# Patient Record
Sex: Female | Born: 1940 | Race: White | Hispanic: No | Smoking: Never smoker
Health system: Southern US, Community
[De-identification: ages and names within clinical notes are randomized; demographics above are authoritative.]

## PROBLEM LIST (undated history)

## (undated) DIAGNOSIS — Z8701 Personal history of pneumonia (recurrent): Secondary | ICD-10-CM

## (undated) DIAGNOSIS — E785 Hyperlipidemia, unspecified: Secondary | ICD-10-CM

## (undated) DIAGNOSIS — I2510 Atherosclerotic heart disease of native coronary artery without angina pectoris: Secondary | ICD-10-CM

## (undated) DIAGNOSIS — J45909 Unspecified asthma, uncomplicated: Secondary | ICD-10-CM

## (undated) DIAGNOSIS — M797 Fibromyalgia: Secondary | ICD-10-CM

## (undated) DIAGNOSIS — N186 End stage renal disease: Secondary | ICD-10-CM

## (undated) DIAGNOSIS — N183 Chronic kidney disease, stage 3 unspecified: Secondary | ICD-10-CM

## (undated) DIAGNOSIS — E119 Type 2 diabetes mellitus without complications: Secondary | ICD-10-CM

## (undated) DIAGNOSIS — I10 Essential (primary) hypertension: Secondary | ICD-10-CM

## (undated) DIAGNOSIS — I4891 Unspecified atrial fibrillation: Secondary | ICD-10-CM

## (undated) DIAGNOSIS — G709 Myoneural disorder, unspecified: Secondary | ICD-10-CM

## (undated) DIAGNOSIS — Z8489 Family history of other specified conditions: Secondary | ICD-10-CM

## (undated) DIAGNOSIS — G8929 Other chronic pain: Secondary | ICD-10-CM

## (undated) DIAGNOSIS — J90 Pleural effusion, not elsewhere classified: Secondary | ICD-10-CM

## (undated) DIAGNOSIS — M1990 Unspecified osteoarthritis, unspecified site: Secondary | ICD-10-CM

## (undated) DIAGNOSIS — I219 Acute myocardial infarction, unspecified: Secondary | ICD-10-CM

## (undated) DIAGNOSIS — J449 Chronic obstructive pulmonary disease, unspecified: Secondary | ICD-10-CM

## (undated) DIAGNOSIS — D649 Anemia, unspecified: Secondary | ICD-10-CM

## (undated) DIAGNOSIS — M545 Low back pain, unspecified: Secondary | ICD-10-CM

## (undated) HISTORY — PX: BACK SURGERY: SHX140

## (undated) HISTORY — PX: CHOLECYSTECTOMY: SHX55

## (undated) HISTORY — DX: Essential (primary) hypertension: I10

## (undated) HISTORY — DX: Personal history of pneumonia (recurrent): Z87.01

## (undated) HISTORY — PX: LUMBAR DISC SURGERY: SHX700

## (undated) HISTORY — DX: Atherosclerotic heart disease of native coronary artery without angina pectoris: I25.10

## (undated) HISTORY — DX: Unspecified atrial fibrillation: I48.91

## (undated) HISTORY — DX: End stage renal disease: N18.6

## (undated) HISTORY — DX: Hyperlipidemia, unspecified: E78.5

## (undated) HISTORY — PX: TONSILLECTOMY: SUR1361

## (undated) HISTORY — DX: Acute myocardial infarction, unspecified: I21.9

---
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
## 1969-02-24 HISTORY — PX: APPENDECTOMY: SHX54

## 1979-02-25 HISTORY — PX: ABDOMINAL HYSTERECTOMY: SHX81

## 1999-02-25 HISTORY — PX: ANTERIOR CERVICAL DECOMP/DISCECTOMY FUSION: SHX1161

## 2010-07-20 ENCOUNTER — Inpatient Hospital Stay (HOSPITAL_COMMUNITY)
Admission: RE | Admit: 2010-07-20 | Discharge: 2010-07-21 | Payer: Self-pay | Source: Home / Self Care | Attending: Orthopedic Surgery | Admitting: Orthopedic Surgery

## 2010-07-20 LAB — URINE MICROSCOPIC-ADD ON

## 2010-07-20 LAB — GLUCOSE, CAPILLARY: Glucose-Capillary: 375 mg/dL — ABNORMAL HIGH (ref 70–99)

## 2010-07-20 LAB — COMPREHENSIVE METABOLIC PANEL
ALT: 20 U/L (ref 0–35)
Alkaline Phosphatase: 61 U/L (ref 39–117)
BUN: 21 mg/dL (ref 6–23)
Chloride: 98 mEq/L (ref 96–112)
Glucose, Bld: 352 mg/dL — ABNORMAL HIGH (ref 70–99)
Potassium: 4.1 mEq/L (ref 3.5–5.1)
Sodium: 134 mEq/L — ABNORMAL LOW (ref 135–145)
Total Bilirubin: 1.1 mg/dL (ref 0.3–1.2)
Total Protein: 6.3 g/dL (ref 6.0–8.3)

## 2010-07-20 LAB — URINALYSIS, ROUTINE W REFLEX MICROSCOPIC
Leukocytes, UA: NEGATIVE
Nitrite: NEGATIVE
Protein, ur: 100 mg/dL — AB
Specific Gravity, Urine: 1.027 (ref 1.005–1.030)
Urobilinogen, UA: 0.2 mg/dL (ref 0.0–1.0)

## 2010-07-20 LAB — SURGICAL PCR SCREEN
MRSA, PCR: NEGATIVE
Staphylococcus aureus: POSITIVE — AB

## 2010-07-20 LAB — DIFFERENTIAL
Basophils Absolute: 0 10*3/uL (ref 0.0–0.1)
Lymphocytes Relative: 38 % (ref 12–46)
Monocytes Absolute: 0.5 10*3/uL (ref 0.1–1.0)
Neutro Abs: 2.3 10*3/uL (ref 1.7–7.7)
Neutrophils Relative %: 51 % (ref 43–77)

## 2010-07-20 LAB — CBC
HCT: 35.6 % — ABNORMAL LOW (ref 36.0–46.0)
Hemoglobin: 11.7 g/dL — ABNORMAL LOW (ref 12.0–15.0)
MCHC: 32.9 g/dL (ref 30.0–36.0)
RBC: 4.11 MIL/uL (ref 3.87–5.11)
WBC: 4.4 10*3/uL (ref 4.0–10.5)

## 2010-07-20 LAB — TYPE AND SCREEN
ABO/RH(D): O POS
Antibody Screen: NEGATIVE

## 2010-07-20 LAB — APTT: aPTT: 27 seconds (ref 24–37)

## 2010-07-20 LAB — PROTIME-INR
INR: 1 (ref 0.00–1.49)
Prothrombin Time: 13.4 seconds (ref 11.6–15.2)

## 2010-07-21 LAB — GLUCOSE, CAPILLARY

## 2010-07-23 LAB — POCT I-STAT 4, (NA,K, GLUC, HGB,HCT): Sodium: 138 mEq/L (ref 135–145)

## 2010-07-23 NOTE — Op Note (Signed)
Joyce [name], Joyce [name]              ACCOUNT NO.:  [id number]  MEDICAL RECORD NO.:  [id number]          PATIENT TYPE:  INP  LOCATION:  2550                         FACILITY:  [facility]  PHYSICIAN:  Phylliss Bob, MD      DATE OF BIRTH:  1940-12-19  DATE OF PROCEDURE: DATE OF DISCHARGE:                              OPERATIVE REPORT   PREOPERATIVE DIAGNOSIS:  Myelopathy.  POSTOPERATIVE DIAGNOSIS:  Myelopathy.  PROCEDURE: 1. C3-4, C4-5 anterior cervical decompression and fusion. 2. Use of structural allograft x2. 3. Use of local autograft. 4. Placement of anterior instrumentation C3-C5. 5. Intraoperative use of fluoroscopy.  SURGEON:  Phylliss Bob, MD  ASSISTANT:  None.  ANESTHESIA:  General endotracheal anesthesia.  ESTIMATED BLOOD LOSS:  50 mL.  COMPLICATIONS:  None.  DISPOSITION:  Stable.  INDICATIONS FOR PROCEDURE:  Briefly, Ms. Hoxsie is a pleasant 70 year old female who presented to my office with signs and symptoms associated with myelopathy.  I did review an MRI, which was consistent with spinal cord compression at the C3-4 and C4-5 levels.  I, therefore, had a discussion with the patient regarding going forward with two-level anterior cervical decompression and fusion.  The patient did understand the risks and benefits of the procedure, and did agree to go forward with the procedure outlined above.  OPERATIVE DETAILS:  On July 20, 2010, the patient was brought to surgery and general endotracheal anesthesia was administered.  The patient was placed supine on the hospital bed.  The neck was placed in a gentle degree of extension.  The bed was placed in reverse Trendelenburg position.  The shoulders were taped to the inferior aspect of the bed. I did bring in lateral fluoroscopy and did obtain a lateral image to optimize the location of my left transverse incision.  The neck was prepped and draped in the usual sterile fashion and a time-out procedure was  performed.  I then made an incision from approximately the midline to the medial border of the sternocleidomastoid muscle.  I identified the plane between the sternocleidomastoid muscle laterally and strap muscles medially.  The plane was explored, and the anterior cervical spine was readily identified.  I did obtain a lateral intraoperative radiograph with a marker needle at the C3-4 interspace.  I then subperiosteally exposed the vertebral bodies of C3, C4, and C5.  I then turned my attention towards the C4-5 interspace.  I placed Caspar distractors across the C4-5 interspace.  I did go forward with a diskectomy in the usual manner using a series of Kerrisons, curettes, and pituitaries.  I did place a gentle degree of distraction across the C4-5 interspace using the Caspar pins.  Of note, self-retaining Shadow- Line retractor was placed.  I did readily identify the posterior longitudinal ligament.  The posterior longitudinal ligament was entered and I did use a #2 Kerrison to remove the posterior longitudinal ligament as well as osteophytes that were noted posteriorly.  The decompression was performed out to the uncovertebral joints of both the right and left sides.  I was easily able to pass the nerve hook out the right and left neural foramen and there  was clearly no compression of the spinal cord at the termination of the procedure.  I then went forward with a series of trials.  I first used a 6 and then a 7-mm rasp. I then placed a 7-mm trial.  I felt that a 7-mm intervertebral graft would be the ideal fit.  I then chose a 7-mm graft from the [employer].  The 7-mm cortical cancellous iliac crest allograft was tamped into position.  Of note, there were extensive osteophytes noted anteriorly and the autograft was collected upon removal of the osteophytes.  The autograft was placed above and below the intervertebral graft in order to help optimize fusion.  I  then turned my attention towards the C3-4 interspace.  I performed diskectomy in the manner described previously.  Again, there was severe compression identified and this was entirely alleviated by the termination of the procedure.  I again placed a 7-mm allograft as described previously and I did place an autograft that was obtained from the decortication aspect of the procedure above and below the graft.  I then chose an appropriately sized Synthes Vectra plate and this was secured across the C3-4 and C4-5 interspaces.  60 mm self-drilling, self-tapping screws were placed through the plate.  I did use AP and lateral fluoroscopic views to help optimize the location of the plate.  I was very happy with the final construct.  All bleeding was meticulously controlled.  Of note, neurologic monitoring was used throughout the procedure.  The monitoring technician did inform me that there were decreased signals in the left foot whereas the right lower extremity and the upper extremities were normal.  Upon obtaining this information, I did get a lateral intraoperative radiograph and they confirmed that the allografts were in their appropriate positions.  There was no undue bleeding and therefore I did feel that this was likely a technical difficulty.  I then proceeded with closure using 2-0 Vicryl and 3-0 Monocryl.  I did perform a thorough neurologic evaluation upon awakening.  The patient was noted to have full neurovascular function with no strength deficits identified.  The patient also was noted to have full sensation.  Of note, all instrument counts were correct at the termination of the procedure.     Phylliss Bob, MD     MD/MEDQ  D:  07/20/2010  T:  07/21/2010  Job:  [id number]  Electronically Signed by Phylliss Bob  on 07/23/2010 09:04:11 AM

## 2010-07-23 NOTE — Op Note (Signed)
  Joyce [name], Joyce [name]              ACCOUNT NO.:  [id number]  MEDICAL RECORD NO.:  [id number]          PATIENT TYPE:  INP  LOCATION:  2550                         FACILITY:  [facility]  PHYSICIAN:  Phylliss Bob, MD      DATE OF BIRTH:  15-Dec-1940  DATE OF PROCEDURE: DATE OF DISCHARGE:                              OPERATIVE REPORT   NO DICTATION FOR THIS JOB.     Phylliss Bob, MD     MD/MEDQ  D:  07/20/2010  T:  07/21/2010  Job:  [id number]  Electronically Signed by Phylliss Bob  on 07/23/2010 09:04:03 AM

## 2010-08-18 NOTE — Discharge Summary (Signed)
  Joyce [name], Joyce [name]              ACCOUNT NO.:  [id number]  MEDICAL RECORD NO.:  [id number]          PATIENT TYPE:  INP  LOCATION:  5023                         FACILITY:  [facility]  PHYSICIAN:  Phylliss Bob, MD      DATE OF BIRTH:  02-Aug-1940  DATE OF ADMISSION:  07/20/2010 DATE OF DISCHARGE:  07/21/2010                              DISCHARGE SUMMARY   ADMISSION DIAGNOSIS:  Myelopathy.  ADMISSION HISTORY:  Briefly, Ms. Shasteen is a pleasant 70 year old female who presented to my office with signs and symptoms associated with myelopathy.  I did review an MRI which was consistent with spinal cord compression at the C3-4 and C4-5 levels.  I therefore admitted the patient on July 20, 2010, for the procedure outlined above.  HOSPITAL COURSE:  On July 20, 2010, the patient was brought to Surgery and underwent a 2-level anterior cervical decompression and fusion as outlined above.  The patient tolerated the procedure well and was transferred to recovery in stable condition.  The patient was admitted to the regular floor uneventfully.  The patient had minimal pain postoperatively.  The patient was evaluated in the morning of postop day #1 and was noted to be neurovascular intact.  Her wound was clean, dry, and intact.  She was well maintained in her Aspen cervical collar.  The patient was discharged home uneventfully on postop day #1.  DISCHARGE INSTRUCTIONS:  The patient will take Percocet for pain and Valium for muscle spasms.  She will be maintained in her hard cervical collar at all times.  I will see the patient back in approximately 2 weeks for a postoperative evaluation.     Phylliss Bob, MD     MD/MEDQ  D:  08/05/2010  T:  08/05/2010  Job:  [id number]  Electronically Signed by Phylliss Bob  on 08/18/2010 02:34:37 PM

## 2014-06-10 ENCOUNTER — Inpatient Hospital Stay (HOSPITAL_COMMUNITY)
Admission: EM | Admit: 2014-06-10 | Discharge: 2014-06-22 | DRG: 234 | Disposition: A | Discharge status: Skilled Nursing Facility | Payer: Medicare Other | Source: Other Acute Inpatient Hospital | Attending: Cardiothoracic Surgery | Admitting: Cardiothoracic Surgery

## 2014-06-10 DIAGNOSIS — E669 Obesity, unspecified: Secondary | ICD-10-CM | POA: Diagnosis present

## 2014-06-10 DIAGNOSIS — M797 Fibromyalgia: Secondary | ICD-10-CM | POA: Diagnosis present

## 2014-06-10 DIAGNOSIS — I2510 Atherosclerotic heart disease of native coronary artery without angina pectoris: Secondary | ICD-10-CM

## 2014-06-10 DIAGNOSIS — I129 Hypertensive chronic kidney disease with stage 1 through stage 4 chronic kidney disease, or unspecified chronic kidney disease: Secondary | ICD-10-CM | POA: Diagnosis present

## 2014-06-10 DIAGNOSIS — G8929 Other chronic pain: Secondary | ICD-10-CM | POA: Diagnosis present

## 2014-06-10 DIAGNOSIS — E1169 Type 2 diabetes mellitus with other specified complication: Secondary | ICD-10-CM

## 2014-06-10 DIAGNOSIS — E8770 Fluid overload, unspecified: Secondary | ICD-10-CM | POA: Diagnosis not present

## 2014-06-10 DIAGNOSIS — I10 Essential (primary) hypertension: Secondary | ICD-10-CM | POA: Diagnosis present

## 2014-06-10 DIAGNOSIS — Z951 Presence of aortocoronary bypass graft: Secondary | ICD-10-CM

## 2014-06-10 DIAGNOSIS — G250 Essential tremor: Secondary | ICD-10-CM | POA: Diagnosis present

## 2014-06-10 DIAGNOSIS — D62 Acute posthemorrhagic anemia: Secondary | ICD-10-CM | POA: Diagnosis not present

## 2014-06-10 DIAGNOSIS — Z794 Long term (current) use of insulin: Secondary | ICD-10-CM | POA: Diagnosis not present

## 2014-06-10 DIAGNOSIS — N183 Chronic kidney disease, stage 3 (moderate): Secondary | ICD-10-CM | POA: Diagnosis present

## 2014-06-10 DIAGNOSIS — N185 Chronic kidney disease, stage 5: Secondary | ICD-10-CM | POA: Diagnosis present

## 2014-06-10 DIAGNOSIS — I214 Non-ST elevation (NSTEMI) myocardial infarction: Principal | ICD-10-CM | POA: Diagnosis present

## 2014-06-10 DIAGNOSIS — I25110 Atherosclerotic heart disease of native coronary artery with unstable angina pectoris: Secondary | ICD-10-CM | POA: Diagnosis present

## 2014-06-10 DIAGNOSIS — K219 Gastro-esophageal reflux disease without esophagitis: Secondary | ICD-10-CM | POA: Diagnosis present

## 2014-06-10 DIAGNOSIS — R079 Chest pain, unspecified: Secondary | ICD-10-CM | POA: Diagnosis present

## 2014-06-10 DIAGNOSIS — E1122 Type 2 diabetes mellitus with diabetic chronic kidney disease: Secondary | ICD-10-CM | POA: Diagnosis not present

## 2014-06-10 DIAGNOSIS — K5900 Constipation, unspecified: Secondary | ICD-10-CM | POA: Diagnosis not present

## 2014-06-10 DIAGNOSIS — N189 Chronic kidney disease, unspecified: Secondary | ICD-10-CM

## 2014-06-10 DIAGNOSIS — Z6832 Body mass index (BMI) 32.0-32.9, adult: Secondary | ICD-10-CM

## 2014-06-10 NOTE — Progress Notes (Signed)
ANTICOAGULATION CONSULT NOTE - Initial Consult  Pharmacy Consult for heparin  Indication: chest pain/ACS/nstemi  Allergies not on file  Patient Measurements: Height: 5\' 5"  (165.1 cm) Weight: 194 lb 14.2 oz (88.4 kg) IBW/kg (Calculated) : 57 Heparin Dosing Weight:   Vital Signs: Temp: 98.1 F (36.7 C) (12/16 2100) Temp Source: Oral (12/16 2100) BP: 175/75 mmHg (12/16 2315) Pulse Rate: 64 (12/16 2315)  Labs: No results for input(s): HGB, HCT, PLT, APTT, LABPROT, INR, HEPARINUNFRC, CREATININE, CKTOTAL, CKMB, TROPONINI in the last 72 hours.  Estimated Creatinine Clearance: 46.1 mL/min (by C-G formula based on Cr of 1.23).   Medical History: No past medical history on file.  Medications: no anticoags on home med list.   No prescriptions prior to admission    Assessment: NSTEMI transfer from [facility] worsening cp x3 days. Hx HTN, CKD III, DM II chronic pain HLD fatty liver depression and essential tremor. PLT H/H WNL. Heparin was started at [facility] and continued until arrival when it was turned off. Off >2 hours.  Goal of Therapy:  Heparin level 0.3-0.7 units/ml Monitor platelets by anticoagulation protocol: Yes   Plan:  Heparin 4000 unitsx1 then 1000 units/hr.  8 hour HL  Daily HL and cbc starting 12/18   Connye Burkitt 06/10/2014,11:58 PM

## 2014-06-10 NOTE — H&P (Signed)
History and Physical  Patient ID: ADRIANN MCBRYAR MRN: [id number], SOB: 1941/06/11 73 y.o. Date of Encounter: 06/10/2014, 11:17 PM  Primary Physician: Jenean Lindau at [facility] clinic Primary Cardiologist: unassigned  Chief Complaint: chest pain  HPI: 72 y.o. female w/ PMHx significant for HTN, DM2, CKD who presented as a transfer from [facility] on 06/10/2014 with complaints of chest pain this AM. Recent history includes a several month history of exertional chest discomfort. She reports a few weeks back, she underwent a chemical stress test which she reports was negative. However, the symptoms recurred and this AM, after discussing with a relative who is a resident at [facility], she sought urgent care at her PCP who was concerned about EKG changes and referred her to the ER. At the ER, she was found to have an elevated troponin prompting transfer to [facility].  She reports central chest discomfort that occurs with exertion such as housework. Radiates to L arm and jaw and is relieved with rest. Has had trouble with beta blockers before (shortness of breath/palpitations). Not on a statin.  At [facility], she was hypertensive to 170s but chest pain free. started on heparin gtt prior to transfer.  Outside CXR was without acute cardiopulmonary abnormalities.   EKG: sinus, slight dep in V6 but otherwise, rather normal.  Labs are significant for:  Glu 160 Bun 30 Cr 1.6 GFR 32 Na 137 K 4.5 Co 25 Mg 1.8 Trop 0.12  WBC 6.2 Hct 34 plt 177 inr 0.9   PMH: 1. HTN 2. CKD, baseline ~1.6-1.7 3. GERD 4. Essential tremor 5. DM2 6. H/o spinal surgery complicated by wound infection 7. Fibromylagia per pt  Surgical History: No past surgical history on file.   Home Meds: lantus 40 qhs trazadone 100 norco prn Tramadol 100 mg q8 prn Lispro 8 tidwm Primidone 100 mg qd Amlodipine 10 mg qd Metformin 1000 bid HCTZ qday Lisinopril 20 Hydralazine 25 tid ASA 81 mg  qday  Allergies:  Codeine Metoprolol- palpitations Carvedilol- SOB  History   Social History  . Marital Status: Married    Spouse Name: N/A    Number of Children: N/A  . Years of Education: N/A   Occupational History  . Not on file.   Social History Main Topics  . Smoking status: Not on file  . Smokeless tobacco: Not on file  . Alcohol Use: Not on file  . Drug Use: Not on file  . Sexual Activity: Not on file   Other Topics Concern  . Not on file   Social History Narrative  . No narrative on file    FH: Diabetes, HTN No CAD  Review of Systems General: negative for chills, fever, night sweats or weight changes.  Cardiovascular: see HPI Dermatological: negative for rash Respiratory: negative for cough or wheezing Urologic: negative for hematuria Abdominal: negative for nausea, vomiting, diarrhea, bright red blood per rectum, melena, or hematemesis Neurologic: negative for visual changes, syncope, or dizziness All other systems reviewed and are otherwise negative except as noted above.  Labs:  see HPI Radiology/Studies:  See HPI  EKG: sinus, slight ST depression laterally  Physical Exam: Blood pressure 180/64, pulse 70, temperature 98.1 F (36.7 C), temperature source Oral, resp. rate 18, height 5\' 5"  (1.651 m), weight 88.4 kg (194 lb 14.2 oz), SpO2 99 %. General: Well developed, well nourished, in no acute distress. Head: Normocephalic, atraumatic, sclera non-icteric, nares are without discharge Neck: Supple. Negative for carotid bruits. JVD not elevated. Lungs: Clear  bilaterally to auscultation without wheezes, rales, or rhonchi. Breathing is unlabored. Heart: RRR with S1 S2. No murmurs, rubs, or gallops appreciated. Abdomen: Soft, non-tender, non-distended with normoactive bowel sounds. No rebound/guarding. No obvious abdominal masses. Msk:  Strength and tone appear normal for age. Extremities: No edema. No clubbing or cyanosis. Distal pedal pulses are 2+  and equal bilaterally. Neuro: Alert and oriented X 3. Moves all extremities spontaneously. Psych:  Responds to questions appropriately with a normal affect.   Problem List 1. Chest pain, elevated troponin  --> NSTEMI 2. HTN, poorly controlled currently 3. DM2, on insulin, orals 4. CKD, baseline ~1.6-1.7 5. Chronic pain/fibromyalgia  ASSESSMENT AND PLAN:  73 y.o. female w/ PMHx significant for HTN, DM2, CKD who presented as a transfer from [facility] on 06/10/2014 with complaints of chest pain this AM. Recent history includes a several month history of exertional chest discomfort, now with worsening symptoms --> NSTEMI.  Her history is rather classic for progressive angina though her negative stress test might have led everyone astray (or maybe small vessel dz, 3 vessel dz, false negative). WIll try alternative beta blocker with bisoprolol due to issues in the past. Continue aspirin, heparin gtt. Start statin. For BP control, will continue home amlodipine and hydralazine. Due to borderline renal function and anticipation of catheterization, hold home ACEI and HCTZ but otherwise, ACEI should be restarted prior to discharge. NPO for catheterization. Gentle hydration prior to cath.  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 As above, at risk with LHC for nephropathy. Renally dose medications. Gentle pre-cath hydration.  Continue chronic pain regimen.  Half dose lantus due to NPO status. Sliding scale to cover. Metformin held and is contraindicated in pt with renal dysfunction.  Full code. NPO Heparin gtt.  Signed, Elias Else, Cullen Lahaie C. MD 06/10/2014, 11:17 PM

## 2014-06-11 ENCOUNTER — Encounter (HOSPITAL_COMMUNITY)
Admission: EM | Disposition: A | Discharge status: Skilled Nursing Facility | Payer: Self-pay | Source: Other Acute Inpatient Hospital | Attending: Cardiothoracic Surgery

## 2014-06-11 ENCOUNTER — Encounter (HOSPITAL_COMMUNITY): Payer: Self-pay

## 2014-06-11 DIAGNOSIS — I10 Essential (primary) hypertension: Secondary | ICD-10-CM

## 2014-06-11 DIAGNOSIS — N185 Chronic kidney disease, stage 5: Secondary | ICD-10-CM | POA: Diagnosis present

## 2014-06-11 DIAGNOSIS — I214 Non-ST elevation (NSTEMI) myocardial infarction: Secondary | ICD-10-CM | POA: Diagnosis present

## 2014-06-11 DIAGNOSIS — N183 Chronic kidney disease, stage 3 (moderate): Secondary | ICD-10-CM

## 2014-06-11 DIAGNOSIS — E669 Obesity, unspecified: Secondary | ICD-10-CM

## 2014-06-11 DIAGNOSIS — E119 Type 2 diabetes mellitus without complications: Secondary | ICD-10-CM

## 2014-06-11 DIAGNOSIS — I25110 Atherosclerotic heart disease of native coronary artery with unstable angina pectoris: Secondary | ICD-10-CM

## 2014-06-11 DIAGNOSIS — E1169 Type 2 diabetes mellitus with other specified complication: Secondary | ICD-10-CM

## 2014-06-11 HISTORY — PX: LEFT HEART CATHETERIZATION WITH CORONARY ANGIOGRAM: SHX5451

## 2014-06-11 LAB — LIPID PANEL
Cholesterol: 177 mg/dL (ref 0–200)
HDL: 48 mg/dL (ref >39)
LDL CALC: 98 mg/dL (ref 0–99)
Total CHOL/HDL Ratio: 3.7 RATIO
Triglycerides: 157 mg/dL — ABNORMAL HIGH (ref <150)
VLDL: 31 mg/dL (ref 0–40)

## 2014-06-11 LAB — BASIC METABOLIC PANEL
ANION GAP: 12 (ref 5–15)
BUN: 25 mg/dL — AB (ref 6–23)
CHLORIDE: 105 meq/L (ref 96–112)
CO2: 23 mEq/L (ref 19–32)
CREATININE: 1.33 mg/dL — AB (ref 0.50–1.10)
Calcium: 9.3 mg/dL (ref 8.4–10.5)
GFR, EST AFRICAN AMERICAN: 45 mL/min — AB (ref >90)
GFR, EST NON AFRICAN AMERICAN: 39 mL/min — AB (ref >90)
Glucose, Bld: 113 mg/dL — ABNORMAL HIGH (ref 70–99)
Potassium: 4.3 mEq/L (ref 3.7–5.3)
Sodium: 140 mEq/L (ref 137–147)

## 2014-06-11 LAB — HEPARIN LEVEL (UNFRACTIONATED): HEPARIN UNFRACTIONATED: 0.31 [IU]/mL (ref 0.30–0.70)

## 2014-06-11 LAB — CBC
HEMATOCRIT: 29.4 % — AB (ref 36.0–46.0)
HEMOGLOBIN: 9.9 g/dL — AB (ref 12.0–15.0)
MCH: 28.4 pg (ref 26.0–34.0)
MCHC: 33.7 g/dL (ref 30.0–36.0)
MCV: 84.5 fL (ref 78.0–100.0)
Platelets: 135 10*3/uL — ABNORMAL LOW (ref 150–400)
RBC: 3.48 MIL/uL — AB (ref 3.87–5.11)
RDW: 13.3 % (ref 11.5–15.5)
WBC: 3.7 10*3/uL — AB (ref 4.0–10.5)

## 2014-06-11 LAB — TROPONIN I
Troponin I: <0.3 ng/mL (ref <0.30)
Troponin I: <0.3 ng/mL (ref <0.30)

## 2014-06-11 LAB — GLUCOSE, CAPILLARY
GLUCOSE-CAPILLARY: 128 mg/dL — AB (ref 70–99)
GLUCOSE-CAPILLARY: 126 mg/dL — AB (ref 70–99)
Glucose-Capillary: 136 mg/dL — ABNORMAL HIGH (ref 70–99)
Glucose-Capillary: 141 mg/dL — ABNORMAL HIGH (ref 70–99)
Glucose-Capillary: 73 mg/dL (ref 70–99)

## 2014-06-11 LAB — HEMOGLOBIN A1C
HEMOGLOBIN A1C: 5.9 % — AB (ref <5.7)
MEAN PLASMA GLUCOSE: 123 mg/dL — AB (ref <117)

## 2014-06-11 LAB — MRSA PCR SCREENING: MRSA BY PCR: NEGATIVE

## 2014-06-11 SURGERY — LEFT HEART CATHETERIZATION WITH CORONARY ANGIOGRAM
Anesthesia: LOCAL

## 2014-06-11 MED ORDER — ASPIRIN 81 MG PO CHEW: 81.0000 mg | CHEWABLE_TABLET | ORAL | Status: DC

## 2014-06-11 MED ORDER — INSULIN ASPART 100 UNIT/ML ~~LOC~~ SOLN
4.0000 [IU] | Freq: Three times a day (TID) | SUBCUTANEOUS | Status: DC
  Administered 2014-06-11 – 2014-06-14 (×10): 4 [IU] via SUBCUTANEOUS

## 2014-06-11 MED ORDER — HEPARIN (PORCINE) IN NACL 2-0.9 UNIT/ML-% IJ SOLN
INTRAMUSCULAR | Status: AC
  Filled 2014-06-11: qty 500

## 2014-06-11 MED ORDER — HEPARIN (PORCINE) IN NACL 100-0.45 UNIT/ML-% IJ SOLN
1400.0000 [IU]/h | INTRAMUSCULAR | Status: DC
  Administered 2014-06-12: 1000 [IU]/h via INTRAVENOUS
  Administered 2014-06-13 – 2014-06-15 (×3): 1400 [IU]/h via INTRAVENOUS
  Filled 2014-06-11 (×6): qty 250

## 2014-06-11 MED ORDER — HEPARIN (PORCINE) IN NACL 100-0.45 UNIT/ML-% IJ SOLN
1000.0000 [IU]/h | INTRAMUSCULAR | Status: DC
  Administered 2014-06-11: 1000 [IU]/h via INTRAVENOUS
  Filled 2014-06-11: qty 250

## 2014-06-11 MED ORDER — INSULIN ASPART 100 UNIT/ML ~~LOC~~ SOLN: 0.0000 [IU] | Freq: Every day | SUBCUTANEOUS | Status: DC

## 2014-06-11 MED ORDER — TRAZODONE HCL 100 MG PO TABS
100.0000 mg | ORAL_TABLET | Freq: Every evening | ORAL | Status: DC | PRN
  Administered 2014-06-11: 100 mg via ORAL
  Filled 2014-06-11: qty 1

## 2014-06-11 MED ORDER — SODIUM CHLORIDE 0.9 % IV SOLN
INTRAVENOUS | Status: DC
  Administered 2014-06-11: 01:00:00 via INTRAVENOUS

## 2014-06-11 MED ORDER — BISOPROLOL FUMARATE 5 MG PO TABS
5.0000 mg | ORAL_TABLET | Freq: Every day | ORAL | Status: DC
  Administered 2014-06-11 – 2014-06-14 (×4): 5 mg via ORAL
  Filled 2014-06-11 (×4): qty 1

## 2014-06-11 MED ORDER — NITROGLYCERIN 0.4 MG SL SUBL: 0.4000 mg | SUBLINGUAL_TABLET | SUBLINGUAL | Status: DC | PRN

## 2014-06-11 MED ORDER — FENTANYL CITRATE 0.05 MG/ML IJ SOLN
INTRAMUSCULAR | Status: AC
  Filled 2014-06-11: qty 2

## 2014-06-11 MED ORDER — SODIUM CHLORIDE 0.9 % IV SOLN: 1.0000 mL/kg/h | INTRAVENOUS | Status: AC

## 2014-06-11 MED ORDER — SODIUM CHLORIDE 0.9 % IV SOLN
250.0000 mL | INTRAVENOUS | Status: DC | PRN
Start: 2014-06-11 — End: 2014-06-11

## 2014-06-11 MED ORDER — SODIUM CHLORIDE 0.9 % IJ SOLN
3.0000 mL | Freq: Two times a day (BID) | INTRAMUSCULAR | Status: DC
  Administered 2014-06-12 – 2014-06-14 (×4): 3 mL via INTRAVENOUS

## 2014-06-11 MED ORDER — NITROGLYCERIN 1 MG/10 ML FOR IR/CATH LAB
INTRA_ARTERIAL | Status: AC
  Filled 2014-06-11: qty 10

## 2014-06-11 MED ORDER — VERAPAMIL HCL 2.5 MG/ML IV SOLN
INTRAVENOUS | Status: AC
  Filled 2014-06-11: qty 2

## 2014-06-11 MED ORDER — INSULIN ASPART 100 UNIT/ML ~~LOC~~ SOLN
0.0000 [IU] | Freq: Three times a day (TID) | SUBCUTANEOUS | Status: DC
  Administered 2014-06-11 – 2014-06-12 (×4): 2 [IU] via SUBCUTANEOUS
  Administered 2014-06-13: 3 [IU] via SUBCUTANEOUS
  Administered 2014-06-14: 2 [IU] via SUBCUTANEOUS
  Administered 2014-06-14: 5 [IU] via SUBCUTANEOUS

## 2014-06-11 MED ORDER — HEPARIN BOLUS VIA INFUSION
4000.0000 [IU] | Freq: Once | INTRAVENOUS | Status: AC
  Administered 2014-06-11: 4000 [IU] via INTRAVENOUS
  Filled 2014-06-11: qty 4000

## 2014-06-11 MED ORDER — ASPIRIN EC 81 MG PO TBEC
81.0000 mg | DELAYED_RELEASE_TABLET | Freq: Every day | ORAL | Status: DC
  Administered 2014-06-11 – 2014-06-14 (×4): 81 mg via ORAL
  Filled 2014-06-11 (×4): qty 1

## 2014-06-11 MED ORDER — SODIUM CHLORIDE 0.9 % IJ SOLN: 3.0000 mL | INTRAMUSCULAR | Status: DC | PRN

## 2014-06-11 MED ORDER — MIDAZOLAM HCL 2 MG/2ML IJ SOLN
INTRAMUSCULAR | Status: AC
  Filled 2014-06-11: qty 2

## 2014-06-11 MED ORDER — AMLODIPINE BESYLATE 10 MG PO TABS
10.0000 mg | ORAL_TABLET | Freq: Every day | ORAL | Status: DC
Start: 2014-06-11 — End: 2014-06-12
  Administered 2014-06-11 – 2014-06-12 (×2): 10 mg via ORAL
  Filled 2014-06-11 (×2): qty 1

## 2014-06-11 MED ORDER — ATORVASTATIN CALCIUM 40 MG PO TABS
40.0000 mg | ORAL_TABLET | Freq: Every day | ORAL | Status: DC
  Administered 2014-06-11 – 2014-06-14 (×4): 40 mg via ORAL
  Filled 2014-06-11 (×6): qty 1

## 2014-06-11 MED ORDER — HEPARIN (PORCINE) IN NACL 100-0.45 UNIT/ML-% IJ SOLN
1100.0000 [IU]/h | INTRAMUSCULAR | Status: DC
  Administered 2014-06-11: 1100 [IU]/h via INTRAVENOUS
  Filled 2014-06-11: qty 250

## 2014-06-11 MED ORDER — LIDOCAINE HCL (PF) 1 % IJ SOLN
INTRAMUSCULAR | Status: AC
  Filled 2014-06-11: qty 30

## 2014-06-11 MED ORDER — HYDRALAZINE HCL 25 MG PO TABS
25.0000 mg | ORAL_TABLET | Freq: Three times a day (TID) | ORAL | Status: DC
  Administered 2014-06-11 (×2): 25 mg via ORAL
  Filled 2014-06-11 (×5): qty 1

## 2014-06-11 MED ORDER — HYDRALAZINE HCL 50 MG PO TABS
50.0000 mg | ORAL_TABLET | Freq: Three times a day (TID) | ORAL | Status: DC
  Administered 2014-06-11 – 2014-06-12 (×3): 50 mg via ORAL
  Filled 2014-06-11 (×4): qty 1
  Filled 2014-06-11: qty 2

## 2014-06-11 MED ORDER — SODIUM CHLORIDE 0.9 % IV SOLN: 250.0000 mL | INTRAVENOUS | Status: DC | PRN

## 2014-06-11 MED ORDER — INSULIN GLARGINE 100 UNIT/ML ~~LOC~~ SOLN
20.0000 [IU] | Freq: Every day | SUBCUTANEOUS | Status: DC
  Administered 2014-06-11 – 2014-06-14 (×5): 20 [IU] via SUBCUTANEOUS
  Filled 2014-06-11 (×6): qty 0.2

## 2014-06-11 MED ORDER — HYDROCODONE-ACETAMINOPHEN 5-325 MG PO TABS
1.0000 | ORAL_TABLET | Freq: Four times a day (QID) | ORAL | Status: DC | PRN
  Administered 2014-06-11 – 2014-06-12 (×2): 1 via ORAL
  Filled 2014-06-11 (×2): qty 1

## 2014-06-11 MED ORDER — NITROGLYCERIN IN D5W 200-5 MCG/ML-% IV SOLN
2.0000 ug/min | INTRAVENOUS | Status: DC
  Administered 2014-06-11 – 2014-06-14 (×2): 10 ug/min via INTRAVENOUS
  Filled 2014-06-11 (×2): qty 250

## 2014-06-11 MED ORDER — PRIMIDONE 50 MG PO TABS
100.0000 mg | ORAL_TABLET | Freq: Every day | ORAL | Status: DC
  Administered 2014-06-11: 100 mg via ORAL
  Filled 2014-06-11 (×3): qty 2

## 2014-06-11 MED ORDER — ONDANSETRON HCL 4 MG/2ML IJ SOLN
4.0000 mg | Freq: Four times a day (QID) | INTRAMUSCULAR | Status: DC | PRN
  Administered 2014-06-11: 4 mg via INTRAVENOUS

## 2014-06-11 MED ORDER — ACETAMINOPHEN 325 MG PO TABS: 650.0000 mg | ORAL_TABLET | ORAL | Status: DC | PRN

## 2014-06-11 MED ORDER — HEPARIN (PORCINE) IN NACL 2-0.9 UNIT/ML-% IJ SOLN
INTRAMUSCULAR | Status: AC
  Filled 2014-06-11: qty 1500

## 2014-06-11 MED ORDER — ONDANSETRON HCL 4 MG/2ML IJ SOLN
INTRAMUSCULAR | Status: AC
  Filled 2014-06-11: qty 2

## 2014-06-11 MED ORDER — SODIUM CHLORIDE 0.9 % IJ SOLN: 3.0000 mL | Freq: Two times a day (BID) | INTRAMUSCULAR | Status: DC

## 2014-06-11 NOTE — Progress Notes (Signed)
ANTICOAGULATION CONSULT NOTE - Follow Up Consult  Pharmacy Consult for heparin Indication: chest pain/ACS  Allergies  Allergen Reactions  . Carvedilol Other (See Comments)    confusion  . Codeine Other (See Comments)    confusion  . Metoprolol Palpitations    Patient Measurements: Height: 5\' 5"  (165.1 cm) Weight: 193 lb 2 oz (87.6 kg) IBW/kg (Calculated) : 57 Heparin Dosing Weight: 76kg  Vital Signs: Temp: 97.9 F (36.6 C) (12/17 1305) Temp Source: Oral (12/17 1305) BP: 178/68 mmHg (12/17 1305) Pulse Rate: 65 (12/17 1558)  Labs:  Recent Labs  06/11/14 0025 06/11/14 0705 06/11/14 0956 06/11/14 1235  HGB  --  9.9*  --   --   HCT  --  29.4*  --   --   PLT  --  135*  --   --   HEPARINUNFRC  --  >2.20* 0.31  --   CREATININE  --  1.33*  --   --   TROPONINI <0.30 <0.30  --  <0.30    Estimated Creatinine Clearance: 42.4 mL/min (by C-G formula based on Cr of 1.33).   Medications:  Scheduled:  . amLODipine  10 mg Oral Daily  . aspirin EC  81 mg Oral Daily  . atorvastatin  40 mg Oral q1800  . bisoprolol  5 mg Oral Daily  . hydrALAZINE  50 mg Oral 3 times per day  . insulin aspart  0-15 Units Subcutaneous TID WC  . insulin aspart  0-5 Units Subcutaneous QHS  . insulin aspart  4 Units Subcutaneous TID WC  . insulin glargine  20 Units Subcutaneous QHS  . primidone  100 mg Oral QHS   Infusions:  . sodium chloride 50 mL/hr at 06/11/14 0059  . heparin Stopped (06/11/14 1525)  . nitroGLYCERIN 10 mcg/min (06/11/14 1146)    Assessment: 73 yo female from [place] on heparin for r/o ACS (troponin 0.12 at [facility]). The initial heparin level is > 2.2 and was possible lab error. Heparin level repeat was 0.31 and on low end of goal on 1000 units/hr.  She is now s/p cardiac cath with findings of severe 3VCAD and nl EF.  TCTS to be consulted for possible CABG.  Pharmacy consulted to dose heparin to start 4 hours after TR band off.  RN to start deflating band at 1820.   Goal  of Therapy:  Heparin level 0.3-0.7 units/ml Monitor platelets by anticoagulation protocol: Yes   Plan - resume heparin at 1100 units/hr 4 hrs after TR band removed -HL 8 hrs later -daily HL and CBC -RN to call pharmacy when TR band off so we can enter heparin orders  Eudelia Bunch, Pharm.D. [id number] 06/11/2014 5:49 PM

## 2014-06-11 NOTE — Interval H&P Note (Signed)
History and Physical Interval Note:  06/11/2014 4:24 PM  Joyce [name]  has presented today for surgery, with the diagnosis of cp  The various methods of treatment have been discussed with the patient and family. After consideration of risks, benefits and other options for treatment, the patient has consented to  Procedure(s): LEFT HEART CATHETERIZATION WITH CORONARY ANGIOGRAM (N/A) as a surgical intervention .  The patient's history has been reviewed, patient examined, no change in status, stable for surgery.  I have reviewed the patient's chart and labs.  Questions were answered to the patient's satisfaction.    Cath Lab Visit (complete for each Cath Lab visit)  Clinical Evaluation Leading to the Procedure:   ACS: Yes.    Non-ACS:    Anginal Classification: CCS IV  Anti-ischemic medical therapy: Maximal Therapy (2 or more classes of medications)  Non-Invasive Test Results: No non-invasive testing performed  Prior CABG: No previous CABG       Joyce [name]

## 2014-06-11 NOTE — CV Procedure (Signed)
    Cardiac Catheterization Procedure Note  Name: Joyce [name] MRN: [id number] DOB: 1941-04-06  Procedure: Left Heart Cath, Selective Coronary Angiography, LV angiography  Indication: Unstable angina. Pt with diabetes and progressive anginal symptoms.   Procedural Details: The right wrist was prepped, draped, and anesthetized with 1% lidocaine. The radial was weak. I used ultrasound but could not access the right radial artery. There was no flow demonstrated above the wrist. The left radial pulse was good. Using the modified Seldinger technique, a 5/6 French Slender sheath was introduced into the left radial artery without difficulty. 3 mg of verapamil was administered through the sheath, weight-based unfractionated heparin was administered intravenously. Standard Judkins catheters were used for selective coronary angiography and left ventriculography. Catheter exchanges were performed over an exchange length guidewire. There were no immediate procedural complications. A TR band was used for radial hemostasis at the completion of the procedure.  The patient was transferred to the post catheterization recovery area for further monitoring.  Procedural Findings: Hemodynamics: AO  180/71 LV 180/19  Coronary angiography: Coronary dominance: right  Left mainstem:  The left main is severely calcified. The vessel is widely patent with no obstruction. There are minor irregularities noted.  Left anterior descending (LAD):  The LAD courses to the LV apex. The vessel is severely calcified. The first diagonal covers a large distribution and has 2 major subbranches. Each subbranch of the diagonal has significant stenosis. The proximal subbranch has 50-60% stenosis. The more distal subbranch has diffuse 80-90% stenosis. The LAD proper has severe calcific disease from the diagonal origin extending into the mid vessel with 80-90% stenosis present. Further down in the mid LAD there is another 80% stenosis  present.  Left circumflex (LCx):  The left circumflex is patent. There is an intermediate branch with bifurcational disease where it divides into twin vessels. The superior branch has 90% ostial stenosis. The inferior branch is patent with mild nonobstructive disease. The AV circumflex has 95-99% stenosis but it only supplies a very small posterolateral branch.  Right coronary artery (RCA):  The RCA is severely calcified throughout the proximal, mid, and distal vessel. There were 2 tight focal lesions, one in the proximal RCA that is at least 80% stenotic, and one in the distal RCA it is also 75-80% stenotic. The PDA and PLA branches are large, especially the PDA.  Left ventriculography: Left ventricular systolic function is normal, LVEF is estimated at 55-65%, there is no significant mitral regurgitation   Estimated Blood Loss:  minimal  Final Conclusions:    1. Severe three-vessel coronary artery disease as outlined above with heavily calcified and diffusely diseased coronary vessels. 2. Normal LV systolic function.  Recommendations:  In this patient with diabetes and unstable angina, will consult TCTS for consideration of CABG. If the patient there is found to be a poor candidate for CABG, she could be treated percutaneously with stenting of the right coronary artery and rotational atherectomy of the LAD. I think she would have more complete revascularization with CABG if a reasonable candidate.  Sherren Mocha MD, [facility] 06/11/2014, 5:30 PM

## 2014-06-11 NOTE — Progress Notes (Addendum)
Pt tolerated removal of TR band well. Pt educated & informed about post band care. VS stable & charted. The site is a level 0. A 2 x 2 along with a tegaderm was applied to the site.  Will continue to monitor the pt. Hoover Brunette, RN

## 2014-06-11 NOTE — H&P (View-Only) (Signed)
Subjective: 2/10 CP & nausea  Objective: Vital signs in last 24 hours: Temp:  [98 F (36.7 C)-98.4 F (36.9 C)] 98.4 F (36.9 C) (12/17 0814) Pulse Rate:  [57-75] 75 (12/17 0814) Resp:  [13-20] 20 (12/17 0814) BP: (126-196)/(45-77) 196/74 mmHg (12/17 0814) SpO2:  [95 %-100 %] 99 % (12/17 0814) FiO2 (%):  [28 %] 28 % (12/17 0025) Weight:  [194 lb 14.2 oz (88.4 kg)] 194 lb 14.2 oz (88.4 kg) (12/16 2100)    Intake/Output from previous day: 12/16 0701 - 12/17 0700 In: 300 [I.V.:300] Out: 650 [Urine:650] Intake/Output this shift: Total I/O In: -  Out: 300 [Urine:300]  Medications Current Facility-Administered Medications  Medication Dose Route Frequency Provider Last Rate Last Dose  . 0.9 %  sodium chloride infusion   Intravenous Continuous Cletus Gash, MD 50 mL/hr at 06/11/14 0059    . amLODipine (NORVASC) tablet 10 mg  10 mg Oral Daily Cletus Gash, MD   10 mg at 06/11/14 1015  . aspirin EC tablet 81 mg  81 mg Oral Daily Cletus Gash, MD   81 mg at 06/11/14 1015  . atorvastatin (LIPITOR) tablet 40 mg  40 mg Oral q1800 Cletus Gash, MD      . bisoprolol (ZEBETA) tablet 5 mg  5 mg Oral Daily Cletus Gash, MD   5 mg at 06/11/14 1015  . heparin ADULT infusion 100 units/mL (25000 units/250 mL)  1,100 Units/hr Intravenous Continuous Dareen Piano, RPH 11 mL/hr at 06/11/14 1117 1,100 Units/hr at 06/11/14 1117  . hydrALAZINE (APRESOLINE) tablet 25 mg  25 mg Oral 3 times per day Cletus Gash, MD   25 mg at 06/11/14 [phone]  . HYDROcodone-acetaminophen (NORCO/VICODIN) 5-325 MG per tablet 1 tablet  1 tablet Oral Q6H PRN Cletus Gash, MD      . insulin aspart (novoLOG) injection 0-15 Units  0-15 Units Subcutaneous TID WC Cletus Gash, MD   2 Units at 06/11/14 [phone]  . insulin aspart (novoLOG) injection 0-5 Units  0-5 Units Subcutaneous QHS Cletus Gash, MD   0 Units at 06/11/14 [phone]  . insulin aspart (novoLOG) injection 4 Units  4 Units Subcutaneous  TID WC Cletus Gash, MD   4 Units at 06/11/14 0813  . insulin glargine (LANTUS) injection 20 Units  20 Units Subcutaneous QHS Cletus Gash, MD   20 Units at 06/11/14 0057  . nitroGLYCERIN (NITROSTAT) SL tablet 0.4 mg  0.4 mg Sublingual Q5 Min x 3 PRN Cletus Gash, MD      . primidone (MYSOLINE) tablet 100 mg  100 mg Oral QHS Cletus Gash, MD   100 mg at 06/11/14 0051    PE: General appearance: alert, cooperative and no distress Lungs: clear to auscultation bilaterally Heart: regular rate and rhythm and 1/6 sys MM Abdomen: +BS nontender Extremities: No LEE Pulses: 2+ and symmetric Skin: Warm and ddry Neurologic: Grossly normal  Lab Results:   Recent Labs  06/11/14 0705  WBC 3.7*  HGB 9.9*  HCT 29.4*  PLT 135*   BMET  Recent Labs  06/11/14 0705  NA 140  K 4.3  CL 105  CO2 23  GLUCOSE 113*  BUN 25*  CREATININE 1.33*  CALCIUM 9.3   PT/INR No results for input(s): LABPROT, INR in the last 72 hours. Cholesterol  Recent Labs  06/11/14 0705  CHOL 177   Lipid Panel     Component Value Date/Time   CHOL 177 06/11/2014 0705   TRIG 157* 06/11/2014 0705   HDL 48  06/11/2014 0705   CHOLHDL 3.7 06/11/2014 0705   VLDL 31 06/11/2014 0705   LDLCALC 98 06/11/2014 0705    Cardiac Panel (last 3 results)  Recent Labs  06/11/14 0025 06/11/14 0705  TROPONINI <0.30 <0.30    Assessment/Plan    NSTEMI (non-ST elevated myocardial infarction) Troponin elevated at [facility].  Starting IV nitro as she is having active CP and is hypertensive.  On IV heparin.  She is on bisoprolol and not tolerant to coreg or metoprolol.  Statin.  Left heart cath around 1400hrs. Zofran for nausea.   Being hydrated due to CKD.  SCr ok.  Check echo.     Chronic kidney disease (CKD), stage III (moderate)  SCr 1.33.  Continue to monitor.        HTN (hypertension)  Amlodipine 10, hydralazine 25-changing to home dose of 50 TID, Holding ACE for cath.  Titrate nitro.    Diabetes  mellitus type 2 in obese  CBG controlled and stable. SS and lantus.    LOS: 1 day    HAGER, BRYAN PA-C 06/11/2014 11:25 AM  Patient seen and examined and history reviewed. Agree with above findings and plan. Patient with some recurrent chest pain this am. Not as severe as before. BP elevated. She reports that it has been poorly controlled since 2000. Intolerant of beta blockers (metoprolol and carvedilol). Plan cardiac cath today. Being hydrated for CKD. Ecg here normal. Troponin at [facility] mildly elevated but normal here.   Laray Corbit [place], [place] 06/11/2014 12:00 PM

## 2014-06-11 NOTE — Progress Notes (Signed)
Subjective: 2/10 CP & nausea  Objective: Vital signs in last 24 hours: Temp:  [98 F (36.7 C)-98.4 F (36.9 C)] 98.4 F (36.9 C) (12/17 0814) Pulse Rate:  [57-75] 75 (12/17 0814) Resp:  [13-20] 20 (12/17 0814) BP: (126-196)/(45-77) 196/74 mmHg (12/17 0814) SpO2:  [95 %-100 %] 99 % (12/17 0814) FiO2 (%):  [28 %] 28 % (12/17 0025) Weight:  [194 lb 14.2 oz (88.4 kg)] 194 lb 14.2 oz (88.4 kg) (12/16 2100)    Intake/Output from previous day: 12/16 0701 - 12/17 0700 In: 300 [I.V.:300] Out: 650 [Urine:650] Intake/Output this shift: Total I/O In: -  Out: 300 [Urine:300]  Medications Current Facility-Administered Medications  Medication Dose Route Frequency Provider Last Rate Last Dose  . 0.9 %  sodium chloride infusion   Intravenous Continuous Cletus Gash, MD 50 mL/hr at 06/11/14 0059    . amLODipine (NORVASC) tablet 10 mg  10 mg Oral Daily Cletus Gash, MD   10 mg at 06/11/14 1015  . aspirin EC tablet 81 mg  81 mg Oral Daily Cletus Gash, MD   81 mg at 06/11/14 1015  . atorvastatin (LIPITOR) tablet 40 mg  40 mg Oral q1800 Cletus Gash, MD      . bisoprolol (ZEBETA) tablet 5 mg  5 mg Oral Daily Cletus Gash, MD   5 mg at 06/11/14 1015  . heparin ADULT infusion 100 units/mL (25000 units/250 mL)  1,100 Units/hr Intravenous Continuous Dareen Piano, RPH 11 mL/hr at 06/11/14 1117 1,100 Units/hr at 06/11/14 1117  . hydrALAZINE (APRESOLINE) tablet 25 mg  25 mg Oral 3 times per day Cletus Gash, MD   25 mg at 06/11/14 [phone]  . HYDROcodone-acetaminophen (NORCO/VICODIN) 5-325 MG per tablet 1 tablet  1 tablet Oral Q6H PRN Cletus Gash, MD      . insulin aspart (novoLOG) injection 0-15 Units  0-15 Units Subcutaneous TID WC Cletus Gash, MD   2 Units at 06/11/14 [phone]  . insulin aspart (novoLOG) injection 0-5 Units  0-5 Units Subcutaneous QHS Cletus Gash, MD   0 Units at 06/11/14 [phone]  . insulin aspart (novoLOG) injection 4 Units  4 Units Subcutaneous  TID WC Cletus Gash, MD   4 Units at 06/11/14 0813  . insulin glargine (LANTUS) injection 20 Units  20 Units Subcutaneous QHS Cletus Gash, MD   20 Units at 06/11/14 0057  . nitroGLYCERIN (NITROSTAT) SL tablet 0.4 mg  0.4 mg Sublingual Q5 Min x 3 PRN Cletus Gash, MD      . primidone (MYSOLINE) tablet 100 mg  100 mg Oral QHS Cletus Gash, MD   100 mg at 06/11/14 0051    PE: General appearance: alert, cooperative and no distress Lungs: clear to auscultation bilaterally Heart: regular rate and rhythm and 1/6 sys MM Abdomen: +BS nontender Extremities: No LEE Pulses: 2+ and symmetric Skin: Warm and ddry Neurologic: Grossly normal  Lab Results:   Recent Labs  06/11/14 0705  WBC 3.7*  HGB 9.9*  HCT 29.4*  PLT 135*   BMET  Recent Labs  06/11/14 0705  NA 140  K 4.3  CL 105  CO2 23  GLUCOSE 113*  BUN 25*  CREATININE 1.33*  CALCIUM 9.3   PT/INR No results for input(s): LABPROT, INR in the last 72 hours. Cholesterol  Recent Labs  06/11/14 0705  CHOL 177   Lipid Panel     Component Value Date/Time   CHOL 177 06/11/2014 0705   TRIG 157* 06/11/2014 0705   HDL 48  06/11/2014 0705   CHOLHDL 3.7 06/11/2014 0705   VLDL 31 06/11/2014 0705   LDLCALC 98 06/11/2014 0705    Cardiac Panel (last 3 results)  Recent Labs  06/11/14 0025 06/11/14 0705  TROPONINI <0.30 <0.30    Assessment/Plan    NSTEMI (non-ST elevated myocardial infarction) Troponin elevated at [facility].  Starting IV nitro as she is having active CP and is hypertensive.  On IV heparin.  She is on bisoprolol and not tolerant to coreg or metoprolol.  Statin.  Left heart cath around 1400hrs. Zofran for nausea.   Being hydrated due to CKD.  SCr ok.  Check echo.     Chronic kidney disease (CKD), stage III (moderate)  SCr 1.33.  Continue to monitor.        HTN (hypertension)  Amlodipine 10, hydralazine 25-changing to home dose of 50 TID, Holding ACE for cath.  Titrate nitro.    Diabetes  mellitus type 2 in obese  CBG controlled and stable. SS and lantus.    LOS: 1 day    HAGER, BRYAN PA-C 06/11/2014 11:25 AM  Patient seen and examined and history reviewed. Agree with above findings and plan. Patient with some recurrent chest pain this am. Not as severe as before. BP elevated. She reports that it has been poorly controlled since 2000. Intolerant of beta blockers (metoprolol and carvedilol). Plan cardiac cath today. Being hydrated for CKD. Ecg here normal. Troponin at [facility] mildly elevated but normal here.   Joyce [name], [place] 06/11/2014 12:00 PM

## 2014-06-11 NOTE — Progress Notes (Signed)
Utilization Review Completed.  

## 2014-06-11 NOTE — Progress Notes (Addendum)
ANTICOAGULATION CONSULT NOTE - Follow Up Consult  Pharmacy Consult for heparin Indication: chest pain/ACS  Allergies  Allergen Reactions  . Carvedilol Other (See Comments)    confusion  . Codeine Other (See Comments)    confusion  . Metoprolol Palpitations    Patient Measurements: Height: 5\' 5"  (165.1 cm) Weight: 194 lb 14.2 oz (88.4 kg) IBW/kg (Calculated) : 57 Heparin Dosing Weight: 76kg  Vital Signs: Temp: 98.4 F (36.9 C) (12/17 0814) Temp Source: Oral (12/17 0814) BP: 196/74 mmHg (12/17 0814) Pulse Rate: 75 (12/17 0814)  Labs:  Recent Labs  06/11/14 0025 06/11/14 0705  HGB  --  9.9*  HCT  --  29.4*  PLT  --  135*  HEPARINUNFRC  --  >2.20*  CREATININE  --  1.33*  TROPONINI <0.30 <0.30    Estimated Creatinine Clearance: 42.6 mL/min (by C-G formula based on Cr of 1.33).   Medications:  Scheduled:  . amLODipine  10 mg Oral Daily  . aspirin EC  81 mg Oral Daily  . atorvastatin  40 mg Oral q1800  . bisoprolol  5 mg Oral Daily  . hydrALAZINE  25 mg Oral 3 times per day  . insulin aspart  0-15 Units Subcutaneous TID WC  . insulin aspart  0-5 Units Subcutaneous QHS  . insulin aspart  4 Units Subcutaneous TID WC  . insulin glargine  20 Units Subcutaneous QHS  . primidone  100 mg Oral QHS   Infusions:  . sodium chloride 50 mL/hr at 06/11/14 0059  . heparin 1,000 Units/hr (06/11/14 0036)    Assessment: 73 yo female from [place] on heparin for r/o ACS (troponin 0.12 at [facility]). The initial heparin level is > 2.2 and possible lab error. Patient noted for cath today.  Goal of Therapy:  Heparin level 0.3-0.7 units/ml Monitor platelets by anticoagulation protocol: Yes   Plan:  -Will repeat a heparin level now  Hildred Laser, Pharm D 06/11/2014 10:11 AM   Addendum -Heparin level repeat was 0.31 and on low end of goal  Plan -Increase heparin to 1100 units/hr to keep in goal -Will f/u cath  Hildred Laser, Pharm D 06/11/2014 10:59 AM

## 2014-06-12 ENCOUNTER — Inpatient Hospital Stay (HOSPITAL_COMMUNITY): Payer: Medicare Other

## 2014-06-12 ENCOUNTER — Other Ambulatory Visit: Payer: Self-pay | Admitting: *Deleted

## 2014-06-12 DIAGNOSIS — I214 Non-ST elevation (NSTEMI) myocardial infarction: Principal | ICD-10-CM

## 2014-06-12 DIAGNOSIS — Z01810 Encounter for preprocedural cardiovascular examination: Secondary | ICD-10-CM

## 2014-06-12 DIAGNOSIS — I2510 Atherosclerotic heart disease of native coronary artery without angina pectoris: Secondary | ICD-10-CM

## 2014-06-12 DIAGNOSIS — I059 Rheumatic mitral valve disease, unspecified: Secondary | ICD-10-CM

## 2014-06-12 DIAGNOSIS — I25110 Atherosclerotic heart disease of native coronary artery with unstable angina pectoris: Secondary | ICD-10-CM

## 2014-06-12 LAB — PULMONARY FUNCTION TEST
DL/VA % pred: 113 %
DL/VA: 5.61 ml/min/mmHg/L
DLCO unc % pred: 91 %
DLCO unc: 23.43 ml/min/mmHg
FEF 25-75 Post: 3 L/sec
FEF 25-75 Pre: 2.67 L/sec
FEF2575-%Change-Post: 12 %
FEF2575-%Pred-Post: 158 %
FEF2575-%Pred-Pre: 141 %
FEV1-%Change-Post: 5 %
FEV1-%Pred-Post: 99 %
FEV1-%Pred-Pre: 94 %
FEV1-Post: 2.31 L
FEV1-Pre: 2.18 L
FEV1FVC-%Change-Post: 1 %
FEV1FVC-%Pred-Pre: 113 %
FEV6-%Change-Post: 4 %
FEV6-%Pred-Post: 90 %
FEV6-%Pred-Pre: 86 %
FEV6-Post: 2.64 L
FEV6-Pre: 2.53 L
FEV6FVC-%Pred-Post: 105 %
FEV6FVC-%Pred-Pre: 105 %
FVC-%Change-Post: 4 %
FVC-%Pred-Post: 86 %
FVC-%Pred-Pre: 82 %
FVC-Post: 2.64 L
FVC-Pre: 2.53 L
Post FEV1/FVC ratio: 87 %
Post FEV6/FVC ratio: 100 %
Pre FEV1/FVC ratio: 86 %
Pre FEV6/FVC Ratio: 100 %
RV % pred: 99 %
RV: 2.25 L
TLC % pred: 94 %
TLC: 4.94 L

## 2014-06-12 LAB — CBC
HEMATOCRIT: 33.3 % — AB (ref 36.0–46.0)
Hemoglobin: 11 g/dL — ABNORMAL LOW (ref 12.0–15.0)
MCH: 28.2 pg (ref 26.0–34.0)
MCHC: 33 g/dL (ref 30.0–36.0)
MCV: 85.4 fL (ref 78.0–100.0)
Platelets: 172 10*3/uL (ref 150–400)
RBC: 3.9 MIL/uL (ref 3.87–5.11)
RDW: 13.4 % (ref 11.5–15.5)
WBC: 5.5 10*3/uL (ref 4.0–10.5)

## 2014-06-12 LAB — URINALYSIS, ROUTINE W REFLEX MICROSCOPIC
Bilirubin Urine: NEGATIVE
Glucose, UA: NEGATIVE mg/dL
Hgb urine dipstick: NEGATIVE
Ketones, ur: NEGATIVE mg/dL
Leukocytes, UA: NEGATIVE
Nitrite: NEGATIVE
Protein, ur: 30 mg/dL — AB
Specific Gravity, Urine: 1.012 (ref 1.005–1.030)
Urobilinogen, UA: 0.2 mg/dL (ref 0.0–1.0)
pH: 6 (ref 5.0–8.0)

## 2014-06-12 LAB — GLUCOSE, CAPILLARY
GLUCOSE-CAPILLARY: 101 mg/dL — AB (ref 70–99)
Glucose-Capillary: 141 mg/dL — ABNORMAL HIGH (ref 70–99)
Glucose-Capillary: 128 mg/dL — ABNORMAL HIGH (ref 70–99)
Glucose-Capillary: 171 mg/dL — ABNORMAL HIGH (ref 70–99)

## 2014-06-12 LAB — URINE MICROSCOPIC-ADD ON

## 2014-06-12 LAB — HEPARIN LEVEL (UNFRACTIONATED)
Heparin Unfractionated: 0.19 IU/mL — ABNORMAL LOW (ref 0.30–0.70)
Heparin Unfractionated: 0.28 IU/mL — ABNORMAL LOW (ref 0.30–0.70)

## 2014-06-12 LAB — SURGICAL PCR SCREEN
MRSA, PCR: NEGATIVE
Staphylococcus aureus: NEGATIVE

## 2014-06-12 MED ORDER — PRIMIDONE 50 MG PO TABS
50.0000 mg | ORAL_TABLET | Freq: Every day | ORAL | Status: DC
  Administered 2014-06-12 – 2014-06-14 (×3): 50 mg via ORAL
  Filled 2014-06-12 (×4): qty 1

## 2014-06-12 MED ORDER — HEPARIN BOLUS VIA INFUSION
2000.0000 [IU] | Freq: Once | INTRAVENOUS | Status: AC
  Administered 2014-06-12: 2000 [IU] via INTRAVENOUS
  Filled 2014-06-12: qty 2000

## 2014-06-12 MED ORDER — HYDROCODONE-ACETAMINOPHEN 5-325 MG PO TABS
1.0000 | ORAL_TABLET | ORAL | Status: DC | PRN
  Administered 2014-06-12 – 2014-06-14 (×4): 1 via ORAL
  Filled 2014-06-12 (×4): qty 1

## 2014-06-12 MED ORDER — ALBUTEROL SULFATE (2.5 MG/3ML) 0.083% IN NEBU
2.5000 mg | INHALATION_SOLUTION | Freq: Once | RESPIRATORY_TRACT | Status: AC
  Administered 2014-06-12: 2.5 mg via RESPIRATORY_TRACT

## 2014-06-12 MED ORDER — AMLODIPINE BESYLATE 10 MG PO TABS
10.0000 mg | ORAL_TABLET | Freq: Every day | ORAL | Status: DC
  Administered 2014-06-12 – 2014-06-14 (×3): 10 mg via ORAL
  Filled 2014-06-12 (×3): qty 1

## 2014-06-12 MED ORDER — HYDRALAZINE HCL 50 MG PO TABS
75.0000 mg | ORAL_TABLET | Freq: Three times a day (TID) | ORAL | Status: DC
  Administered 2014-06-12 – 2014-06-14 (×8): 75 mg via ORAL
  Filled 2014-06-12 (×19): qty 1

## 2014-06-12 MED ORDER — TRAMADOL HCL 50 MG PO TABS
50.0000 mg | ORAL_TABLET | Freq: Four times a day (QID) | ORAL | Status: DC | PRN
  Administered 2014-06-12 – 2014-06-14 (×2): 50 mg via ORAL
  Filled 2014-06-12 (×2): qty 1

## 2014-06-12 MED ORDER — TRAZODONE HCL 100 MG PO TABS
100.0000 mg | ORAL_TABLET | Freq: Every day | ORAL | Status: DC
  Administered 2014-06-12 – 2014-06-14 (×3): 100 mg via ORAL
  Filled 2014-06-12 (×4): qty 1

## 2014-06-12 MED ORDER — SENNOSIDES-DOCUSATE SODIUM 8.6-50 MG PO TABS
1.0000 | ORAL_TABLET | Freq: Every morning | ORAL | Status: DC
  Administered 2014-06-12 – 2014-06-14 (×3): 1 via ORAL
  Filled 2014-06-12 (×3): qty 1

## 2014-06-12 NOTE — Progress Notes (Signed)
VASCULAR LAB PRELIMINARY  PRELIMINARY  PRELIMINARY  PRELIMINARY  Pre-op Cardiac Surgery  Carotid Findings:  Bilateral:  1-39% ICA stenosis.  Vertebral artery flow is antegrade.      Upper Extremity Right Left  Brachial Pressures 159  Triphasic  157  Triphasic   Radial Waveforms Triphasic  Triphasic   Ulnar Waveforms Triphasic  Triphasic   Palmar Arch (Allen's Test) Within normal limits  Within normal limits     Lower  Extremity Right Left  Dorsalis Pedis 214  Triphasic  203  Triphasic  Anterior Tibial    Posterior Tibial 201  Triphasic  212  Triphasic   Ankle/Brachial Indices 1.35 1.33    Findings:  Calcified vessels   Joyce [name], RVT 06/12/2014, 5:07 PM

## 2014-06-12 NOTE — Progress Notes (Signed)
  Echocardiogram 2D Echocardiogram has been performed.  Joyce [name] 06/12/2014, 12:49 PM

## 2014-06-12 NOTE — Consult Note (Signed)
[address]       [place],[address] [address]             [phone]        Linnell O Stender [place] Medical Record [id number] Date of Birth: [birth year]  Referring: No ref. provider found Primary Care: No primary care provider on file.  Chief Complaint:   Exertional chest pain  History of Present Illness:     Patient examined, coronary angiogram is reviewed. 73 year old Caucasian obese diabetic hypertensive  nonsmoker with progression of chest pain over the past several weeks. She was evaluated initially and a myocardial perfusion stress test was performed. This was interpreted as negative. The patient had persistent chest pain and was reevaluated and a cardiac catheterization was recommended. Cardiac catheterization demonstrated severe three-vessel coronary disease with preserved LV systolic function, LVEDP 19 mmHg.there was a 80-90% LAD stenosis, stenosis of the first diagonal of 80%, circumflex marginal 90% stenosis and 80-90% stenosis the RCA. Catheterization was performed via left radial artery. Surgical coronary revascularization was recommended.  The patient is currently stable on the telemetry unit on intravenous heparin and nitroglycerin.   Current Activity/ Functional Status: Patient is retired and lives alone   Zubrod Score: At the time of surgery this patient's most appropriate activity status/level should be described as: []     0    Normal activity, no symptoms [x]     1    Restricted in physical strenuous activity but ambulatory, able to do out light work []     2    Ambulatory and capable of self care, unable to do work activities, up and about                 more than 50%  Of the time                            []     3    Only limited self care, in bed greater than 50% of waking hours []     4    Completely disabled, no self care, confined to bed or chair []     5    Moribund  Past Medical History  Diagnosis Date  . Chronic kidney disease   .  Diabetes mellitus without complication   . Hypertension   . Anginal pain     Past Surgical History  Procedure Laterality Date  . Cholecystectomy    . Abdominal hysterectomy    . Tonsillectomy    . Left heart catheterization with coronary angiogram N/A 06/11/2014    Procedure: LEFT HEART CATHETERIZATION WITH CORONARY ANGIOGRAM;  Surgeon: Blane Ohara, MD;  Location: [facility] CATH LAB;  Service: Cardiovascular;  Laterality: N/A;    History  Smoking status  . Never Smoker   Smokeless tobacco  . Not on file    History  Alcohol Use: Not on file    History   Social History  . Marital Status: Married    Spouse Name: N/A    Number of Children: N/A  . Years of Education: N/A   Occupational History  . Not on file.   Social History Main Topics  . Smoking status: Never Smoker   . Smokeless tobacco: Not on file  . Alcohol Use: Not on file  . Drug Use: Not on file  . Sexual Activity: Not on file   Other Topics Concern  . Not on file   Social History  Narrative  . No narrative on file    Allergies  Allergen Reactions  . Carvedilol Other (See Comments)    confusion  . Codeine Other (See Comments)    confusion  . Metoprolol Palpitations    Current Facility-Administered Medications  Medication Dose Route Frequency Provider Last Rate Last Dose  . 0.9 %  sodium chloride infusion  250 mL Intravenous PRN Blane Ohara, MD      . acetaminophen (TYLENOL) tablet 650 mg  650 mg Oral Q4H PRN Blane Ohara, MD      . amLODipine (NORVASC) tablet 10 mg  10 mg Oral Daily Rhonda G Barrett, PA-C      . aspirin EC tablet 81 mg  81 mg Oral Daily Cletus Gash, MD   81 mg at 06/12/14 1032  . atorvastatin (LIPITOR) tablet 40 mg  40 mg Oral q1800 Cletus Gash, MD   40 mg at 06/11/14 2225  . bisoprolol (ZEBETA) tablet 5 mg  5 mg Oral Daily Cletus Gash, MD   5 mg at 06/12/14 1032  . heparin ADULT infusion 100 units/mL (25000 units/250 mL)  1,200 Units/hr Intravenous  Continuous Lauren Bajbus, RPH 12 mL/hr at 06/12/14 0941 1,200 Units/hr at 06/12/14 0941  . hydrALAZINE (APRESOLINE) tablet 75 mg  75 mg Oral 3 times per day Lonn Georgia, PA-C      . HYDROcodone-acetaminophen (NORCO/VICODIN) 5-325 MG per tablet 1 tablet  1 tablet Oral Q4H PRN Rhonda G Barrett, PA-C      . insulin aspart (novoLOG) injection 0-15 Units  0-15 Units Subcutaneous TID WC Cletus Gash, MD   2 Units at 06/12/14 [phone]  . insulin aspart (novoLOG) injection 0-5 Units  0-5 Units Subcutaneous QHS Cletus Gash, MD   0 Units at 06/11/14 [phone]  . insulin aspart (novoLOG) injection 4 Units  4 Units Subcutaneous TID WC Cletus Gash, MD   4 Units at 06/12/14 [phone]  . insulin glargine (LANTUS) injection 20 Units  20 Units Subcutaneous QHS Cletus Gash, MD   20 Units at 06/11/14 2225  . nitroGLYCERIN (NITROSTAT) SL tablet 0.4 mg  0.4 mg Sublingual Q5 Min x 3 PRN Cletus Gash, MD      . nitroGLYCERIN 50 mg in dextrose 5 % 250 mL (0.2 mg/mL) infusion  2-200 mcg/min Intravenous Titrated Brett Canales, PA-C 3 mL/hr at 06/11/14 1146 10 mcg/min at 06/11/14 1146  . ondansetron (ZOFRAN) injection 4 mg  4 mg Intravenous Q6H PRN Brett Canales, PA-C   4 mg at 06/11/14 1143  . primidone (MYSOLINE) tablet 50 mg  50 mg Oral Daily Rhonda G Barrett, PA-C      . senna-docusate (Senokot-S) tablet 1 tablet  1 tablet Oral q morning - 10a Brieanna Nau M [place], MD   1 tablet at 06/12/14 1032  . sodium chloride 0.9 % injection 3 mL  3 mL Intravenous Q12H Blane Ohara, MD   3 mL at 06/12/14 1033  . sodium chloride 0.9 % injection 3 mL  3 mL Intravenous PRN Blane Ohara, MD      . traMADol Veatrice Bourbon) tablet 50 mg  50 mg Oral Q6H PRN Evelene Croon Barrett, PA-C      . traZODone (DESYREL) tablet 100 mg  100 mg Oral QHS Rhonda G Barrett, PA-C        Prescriptions prior to admission  Medication Sig Dispense Refill Last Dose  . amLODipine (NORVASC) 10 MG tablet Take 10 mg by mouth daily.   5 06/10/2014 at Unknown  time  . HUMALOG KWIKPEN 100 UNIT/ML KiwkPen Inject 8 Units into the skin 3 (three) times daily with meals.   1 06/10/2014 at Unknown time  . hydrALAZINE (APRESOLINE) 100 MG tablet Take 50 mg by mouth 3 (three) times daily.   3 06/10/2014 at Unknown time  . hydrochlorothiazide (HYDRODIURIL) 25 MG tablet Take 25 mg by mouth daily.   3 06/10/2014 at Unknown time  . HYDROcodone-acetaminophen (NORCO/VICODIN) 5-325 MG per tablet Take 1 tablet by mouth every 4 (four) hours as needed for moderate pain or severe pain.   0 06/10/2014 at Unknown time  . LANTUS 100 UNIT/ML injection Inject 0-40 Units into the skin at bedtime. Sliding scale  2 06/10/2014 at Unknown time  . lisinopril (PRINIVIL,ZESTRIL) 20 MG tablet Take 20 mg by mouth 2 (two) times daily.   5 06/10/2014 at Unknown time  . metFORMIN (GLUCOPHAGE) 1000 MG tablet Take 1,000 mg by mouth 2 (two) times daily.   3 06/10/2014 at Unknown time  . primidone (MYSOLINE) 50 MG tablet Take 50 mg by mouth daily.   5 06/10/2014 at Unknown time  . traMADol (ULTRAM) 50 MG tablet Take 50 mg by mouth every 6 (six) hours as needed (for breakthrough pain not covered by hydrocodone).   0 06/10/2014 at Unknown time  . traZODone (DESYREL) 100 MG tablet Take 100 mg by mouth at bedtime.   0 06/09/2014    History reviewed. No pertinent family history.   Review of Systems:  The patient is right-hand dominant The patient is been previously tested for MRSA screen and is positive The patient has previous back surgery without anesthesia complication The patient denies any thoracic trauma     Cardiac Review of Systems: Y or N  Chest Pain [  yes  ]  Resting SOB [  no ] Exertional SOB  [ yes ]  Lamarr Lulas  ]   Pedal Edema [  no ]    Palpitations [no  ] Syncope  [ no ]   Presyncope [ no  ]  General Review of Systems: [Y] = yes [  ]=no Constitional: recent weight change [  ]; anorexia [  ]; fatigue [  yes]; nausea [  ]; night sweats [  ]; fever [  ]; or chills [  ]                                                                Dental: poor dentition[  ]; Last Dentist visit: every 6 months  Eye : blurred vision [  ]; diplopia [   ]; vision changes [  ];  Amaurosis fugax[  ]; Resp: cough [  ];  wheezing[  ];  hemoptysis[  ]; shortness of breath[  ]; paroxysmal nocturnal dyspnea[  ]; dyspnea on exertion[yes  ]; or orthopnea[  ];  GI:  gallstones[  ], vomiting[  ];  dysphagia[  ]; melena[  ];  hematochezia [  ]; heartburn[  ];   Hx of  Colonoscopy[  ]; GU: kidney stones [  ]; hematuria[  ];   dysuria [  ];  nocturia[  ];  history of     obstruction [  ]; urinary frequency [  ]  Skin: rash, swelling[  ];, hair loss[  ];  peripheral edema[  ];  or itching[  ]; Musculosketetal: myalgias[  ];  joint swelling[  ];  joint erythema[  ];  joint pain[  ];  back pain[  ];  Heme/Lymph: bruising[  ];  bleeding[  ];  anemia[  ];  Neuro: TIA[  ];  headaches[  ];  stroke[  ];  vertigo[  ];  seizures[  ];   paresthesias[  ];  difficulty walking[  ];  Psych:depression[  ]; anxiety[  ];  Endocrine: diabetes[ yes ];  thyroid dysfunction[  ];  Immunizations: Flu [  ]; Pneumococcal[  ];  Other:  Physical Exam: BP 150/60 mmHg  Pulse 56  Temp(Src) 98.3 F (36.8 C) (Oral)  Resp 14  Ht 5\' 5"  (1.651 m)  Wt 193 lb 2 oz (87.6 kg)  BMI 32.14 kg/m2  SpO2 97%  General appearance-middle-aged overweight female no acute distress HEENT normocephalic pupils equal dentition good Neck-no JVD mass or bruit Thorax-no deformity tenderness, breath sounds clear Cardiac-regular rhythm without murmur or gallop Abdomen-soft nontender without pulsatile mass Extremities-no clubbing cyanosis tenderness or varicosities Vascular-palpable pulses in extremities Neurologic-alert and oriented no focal motor deficit   Diagnostic Studies & Laboratory data:     Recent Radiology Findings:   No results found.  chest x-ray clear  Recent Lab Findings: Lab Results  Component Value Date   WBC  5.5 06/12/2014   HGB 11.0* 06/12/2014   HCT 33.3* 06/12/2014   PLT 172 06/12/2014   GLUCOSE 113* 06/11/2014   CHOL 177 06/11/2014   TRIG 157* 06/11/2014   HDL 48 06/11/2014   LDLCALC 98 06/11/2014   ALT 20 07/20/2010   AST 28 07/20/2010   NA 140 06/11/2014   K 4.3 06/11/2014   CL 105 06/11/2014   CREATININE 1.33* 06/11/2014   BUN 25* 06/11/2014   CO2 23 06/11/2014   INR 1.00 07/20/2010   HGBA1C 5.9* 06/11/2014      Assessment / Plan:      73 year old diabetic hypertensive female with severe three-vessel disease and unstable angina. She will be prepared for multivessel CABG on Monday, December 21 at [facility] hospital. I have discussed the procedure in detail including indications and risks and she understands and agrees to proceed.      @ME1 @ 06/12/2014 12:27 PM

## 2014-06-12 NOTE — Progress Notes (Signed)
Patient Name: Joyce [name] Date of Encounter: 06/12/2014  Principal Problem:   NSTEMI (non-ST elevated myocardial infarction) Active Problems:   Chronic kidney disease (CKD), stage III (moderate)   HTN (hypertension)   Diabetes mellitus type 2 in obese   Primary Cardiologist: PJ (new)  Patient Profile: 35 w/ hx DM, HTN, CKD 2, FH CAD, tx from [facility] 12/16 w/ NSTEMI (ez abnl and dynamic ECG changes w/ chest pain there) cath 12/17 w/ CABG planned for 12/21.   SUBJECTIVE: No chest pain or SOB on current rx.  OBJECTIVE Filed Vitals:   06/11/14 2110 06/11/14 2207 06/12/14 0500 06/12/14 0615  BP: 178/63  136/63 150/60  Pulse: 62 64 56   Temp: 98.8 F (37.1 C)  98.3 F (36.8 C)   TempSrc: Oral  Oral   Resp: 15 15 14    Height:      Weight:      SpO2: 94% 95% 97%     Intake/Output Summary (Last 24 hours) at 06/12/14 1128 Last data filed at 06/12/14 0900  Gross per 24 hour  Intake    258 ml  Output   1675 ml  Net  -1417 ml   Filed Weights   06/10/14 2100 06/11/14 1500  Weight: 194 lb 14.2 oz (88.4 kg) 193 lb 2 oz (87.6 kg)    PHYSICAL EXAM General: Well developed, well nourished, female in no acute distress. Head: Normocephalic, atraumatic.  Neck: Supple without bruits, JVD not elevated. Lungs:  Resp regular and unlabored, CTA. Heart: RRR, S1, S2, no S3, S4, soft murmur; no rub. Abdomen: Soft, non-tender, non-distended, BS + x 4.  Extremities: No clubbing, cyanosis, no edema.  Left radial cath site without hematoma, small amount ecchymosis Neuro: Alert and oriented X 3. Moves all extremities spontaneously. Psych: Normal affect.  LABS: CBC: Recent Labs  06/11/14 0705 06/12/14 0827  WBC 3.7* 5.5  HGB 9.9* 11.0*  HCT 29.4* 33.3*  MCV 84.5 85.4  PLT 135* [id number]   Basic Metabolic Panel: Recent Labs  06/11/14 0705  NA 140  K 4.3  CL 105  CO2 23  GLUCOSE 113*  BUN 25*  CREATININE 1.33*  CALCIUM 9.3   Cardiac Enzymes: Recent Labs  06/11/14 0025  06/11/14 0705 06/11/14 1235  TROPONINI <0.30 <0.30 <0.30   Hemoglobin A1C: Recent Labs  06/11/14 0025  HGBA1C 5.9*   Fasting Lipid Panel: Recent Labs  06/11/14 0705  CHOL 177  HDL 48  LDLCALC 98  TRIG 157*  CHOLHDL 3.7   TELE:   SR, sinus brady  Current Medications:  . amLODipine  10 mg Oral Daily  . aspirin EC  81 mg Oral Daily  . atorvastatin  40 mg Oral q1800  . bisoprolol  5 mg Oral Daily  . hydrALAZINE  50 mg Oral 3 times per day  . insulin aspart  0-15 Units Subcutaneous TID WC  . insulin aspart  0-5 Units Subcutaneous QHS  . insulin aspart  4 Units Subcutaneous TID WC  . insulin glargine  20 Units Subcutaneous QHS  . primidone  100 mg Oral QHS  . senna-docusate  1 tablet Oral q morning - 10a  . sodium chloride  3 mL Intravenous Q12H   . heparin 1,200 Units/hr (06/12/14 0941)  . nitroGLYCERIN 10 mcg/min (06/11/14 1146)    ASSESSMENT AND PLAN: Principal Problem:   NSTEMI (non-ST elevated myocardial infarction) - for CABG Monday, per patient PVT has been by. Full consult note to follow. Symptoms controlled on  current rx, continue ASA, heparin, high-dose statin, BB. EF 55% at cath, echo pending  Active Problems:   Chronic kidney disease (CKD), stage III (moderate) - follow after cath    HTN (hypertension) - BP is elevated, PTA was on HCTZ 25 mg and lisinopril 20 mg, those are on hold, increase hydralazine to 75 mg TID and follow.      Diabetes mellitus type 2 in obese - on SSI plus Lantus partial dose, CBGs OK. Metformin on hold.  Plan - CABG per TCTS, rehab to see, begin education.   Signed, Rosaria Ferries , PA-C 11:28 AM 06/12/2014 Patient seen and examined and history reviewed. Agree with above findings and plan. Cath results noted. Plan CABG Monday. Echo pending. Currently pain free on IV Ntg and heparin. Adjust BP meds as noted. Anticipate resuming ACEi post op.  [name], [place] 06/12/2014 12:01 PM

## 2014-06-12 NOTE — Progress Notes (Addendum)
ANTICOAGULATION CONSULT NOTE - Follow Up Consult  Pharmacy Consult for heparin Indication: chest pain/ACS  Allergies  Allergen Reactions  . Carvedilol Other (See Comments)    confusion  . Codeine Other (See Comments)    confusion  . Metoprolol Palpitations    Patient Measurements: Height: 5\' 5"  (165.1 cm) Weight: 193 lb 2 oz (87.6 kg) IBW/kg (Calculated) : 57 Heparin Dosing Weight: 76kg  Vital Signs: Temp: 98.3 F (36.8 C) (12/18 0500) Temp Source: Oral (12/18 0500) BP: 150/60 mmHg (12/18 0615) Pulse Rate: 56 (12/18 0500)  Labs:  Recent Labs  06/11/14 0025 06/11/14 0705 06/11/14 0956 06/11/14 1235 06/12/14 0827  HGB  --  9.9*  --   --  11.0*  HCT  --  29.4*  --   --  33.3*  PLT  --  135*  --   --  172  HEPARINUNFRC  --  >2.20* 0.31  --  0.19*  CREATININE  --  1.33*  --   --   --   TROPONINI <0.30 <0.30  --  <0.30  --     Estimated Creatinine Clearance: 42.4 mL/min (by C-G formula based on Cr of 1.33).   Assessment: 73 yo female transferred from [facility] for r/o ACS. 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 Heparin was resumed last evening 4h after TR band removal- started at 1000 units/hr (note from RPh says 1100 units/hr, but order was entered and pump running at 1000 units/h), initial heparin level this morning low at 0.19. Per RN, no issues with line or stoppage of drip. No bleeding or oozing.   Goal of Therapy:  Heparin level 0.3-0.7 units/ml Monitor platelets by anticoagulation protocol: Yes   Plan -small heparin bolus with 2000 units IV x1, then increase heparin drip to 1200 units/hr -HL 8 hrs later -daily HL and CBC -follow for CABG workup and s/s bleeding  Aissata Wilmore D. Garhett Bernhard, PharmD, BCPS Clinical Pharmacist Pager: [phone] 06/12/2014 9:39 AM

## 2014-06-12 NOTE — Progress Notes (Addendum)
ANTICOAGULATION CONSULT NOTE - Follow Up Consult  Pharmacy Consult for heparin Indication: chest pain/ACS  Allergies  Allergen Reactions  . Carvedilol Other (See Comments)    confusion  . Codeine Other (See Comments)    confusion  . Metoprolol Palpitations    Patient Measurements: Height: 5\' 5"  (165.1 cm) Weight: 193 lb 2 oz (87.6 kg) IBW/kg (Calculated) : 57 Heparin Dosing Weight: 76kg  Vital Signs: Temp: 98.3 F (36.8 C) (12/18 1509) Temp Source: Oral (12/18 1509) BP: 153/54 mmHg (12/18 1509) Pulse Rate: 61 (12/18 1509)  Labs:  Recent Labs  06/11/14 0025  06/11/14 0705 06/11/14 0956 06/11/14 1235 06/12/14 0827 06/12/14 1828  HGB  --   --  9.9*  --   --  11.0*  --   HCT  --   --  29.4*  --   --  33.3*  --   PLT  --   --  135*  --   --  172  --   HEPARINUNFRC  --   < > >2.20* 0.31  --  0.19* 0.28*  CREATININE  --   --  1.33*  --   --   --   --   TROPONINI <0.30  --  <0.30  --  <0.30  --   --   < > = values in this interval not displayed.  Estimated Creatinine Clearance: 42.4 mL/min (by C-G formula based on Cr of 1.33).   Assessment: 73 yo female on IV heparin s/p cardiac cath with findings of severe 3VCAD being worked up for possible CABG.   Heparin level tonight slightly sub-therapeutic at 0.28 on 1200 units/hr. No bleeding reported.  Goal of Therapy:  Heparin level 0.3-0.7 units/ml Monitor platelets by anticoagulation protocol: Yes   Plan Increase heparin to 1400 units/hr.  Daily HL and CBC Follow for CABG workup and s/s bleeding  Sloan Leiter, PharmD, BCPS Clinical Pharmacist [phone]  06/12/2014 7:37 PM

## 2014-06-13 ENCOUNTER — Inpatient Hospital Stay (HOSPITAL_COMMUNITY): Payer: Medicare Other

## 2014-06-13 LAB — COMPREHENSIVE METABOLIC PANEL
ALT: 8 U/L (ref 0–35)
ALT: 11 U/L (ref 0–35)
AST: 11 U/L (ref 0–37)
AST: 14 U/L (ref 0–37)
Albumin: 3 g/dL — ABNORMAL LOW (ref 3.5–5.2)
Albumin: 3.4 g/dL — ABNORMAL LOW (ref 3.5–5.2)
Alkaline Phosphatase: 37 U/L — ABNORMAL LOW (ref 39–117)
Alkaline Phosphatase: 42 U/L (ref 39–117)
Anion gap: 12 (ref 5–15)
Anion gap: 13 (ref 5–15)
BUN: 24 mg/dL — ABNORMAL HIGH (ref 6–23)
BUN: 24 mg/dL — ABNORMAL HIGH (ref 6–23)
CO2: 22 mEq/L (ref 19–32)
CO2: 24 mEq/L (ref 19–32)
Calcium: 8.6 mg/dL (ref 8.4–10.5)
Calcium: 9.1 mg/dL (ref 8.4–10.5)
Chloride: 105 mEq/L (ref 96–112)
Chloride: 102 mEq/L (ref 96–112)
Creatinine, Ser: 1.55 mg/dL — ABNORMAL HIGH (ref 0.50–1.10)
Creatinine, Ser: 1.55 mg/dL — ABNORMAL HIGH (ref 0.50–1.10)
GFR calc Af Amer: 38 mL/min — ABNORMAL LOW (ref >90)
GFR calc Af Amer: 38 mL/min — ABNORMAL LOW (ref >90)
GFR calc non Af Amer: 33 mL/min — ABNORMAL LOW (ref >90)
GFR calc non Af Amer: 33 mL/min — ABNORMAL LOW (ref >90)
Glucose, Bld: 160 mg/dL — ABNORMAL HIGH (ref 70–99)
Glucose, Bld: 150 mg/dL — ABNORMAL HIGH (ref 70–99)
Potassium: 4.3 mEq/L (ref 3.7–5.3)
Potassium: 4.4 mEq/L (ref 3.7–5.3)
Sodium: 139 mEq/L (ref 137–147)
Sodium: 139 mEq/L (ref 137–147)
Total Bilirubin: <0.2 mg/dL — ABNORMAL LOW (ref 0.3–1.2)
Total Bilirubin: 0.3 mg/dL (ref 0.3–1.2)
Total Protein: 5.5 g/dL — ABNORMAL LOW (ref 6.0–8.3)
Total Protein: 6.1 g/dL (ref 6.0–8.3)

## 2014-06-13 LAB — PROTIME-INR
INR: 1.04 (ref 0.00–1.49)
Prothrombin Time: 13.7 seconds (ref 11.6–15.2)

## 2014-06-13 LAB — HEMOGLOBIN A1C
Hgb A1c MFr Bld: 5.9 % — ABNORMAL HIGH (ref <5.7)
Mean Plasma Glucose: 123 mg/dL — ABNORMAL HIGH (ref <117)

## 2014-06-13 LAB — CBC
HCT: 28.4 % — ABNORMAL LOW (ref 36.0–46.0)
HCT: 30.6 % — ABNORMAL LOW (ref 36.0–46.0)
Hemoglobin: 9.3 g/dL — ABNORMAL LOW (ref 12.0–15.0)
Hemoglobin: 10.1 g/dL — ABNORMAL LOW (ref 12.0–15.0)
MCH: 28.3 pg (ref 26.0–34.0)
MCH: 28.4 pg (ref 26.0–34.0)
MCHC: 32.7 g/dL (ref 30.0–36.0)
MCHC: 33 g/dL (ref 30.0–36.0)
MCV: 86.3 fL (ref 78.0–100.0)
MCV: 86 fL (ref 78.0–100.0)
PLATELETS: 128 10*3/uL — AB (ref 150–400)
Platelets: 134 10*3/uL — ABNORMAL LOW (ref 150–400)
RBC: 3.29 MIL/uL — ABNORMAL LOW (ref 3.87–5.11)
RBC: 3.56 MIL/uL — ABNORMAL LOW (ref 3.87–5.11)
RDW: 13.5 % (ref 11.5–15.5)
RDW: 13.4 % (ref 11.5–15.5)
WBC: 4.7 10*3/uL (ref 4.0–10.5)
WBC: 4.4 10*3/uL (ref 4.0–10.5)

## 2014-06-13 LAB — HEPARIN LEVEL (UNFRACTIONATED)
Heparin Unfractionated: 0.41 IU/mL (ref 0.30–0.70)
Heparin Unfractionated: 0.44 IU/mL (ref 0.30–0.70)

## 2014-06-13 LAB — GLUCOSE, CAPILLARY
GLUCOSE-CAPILLARY: 172 mg/dL — AB (ref 70–99)
GLUCOSE-CAPILLARY: 91 mg/dL (ref 70–99)
Glucose-Capillary: 93 mg/dL (ref 70–99)

## 2014-06-13 LAB — TSH: TSH: 3.7 u[IU]/mL (ref 0.350–4.500)

## 2014-06-13 LAB — PREPARE RBC (CROSSMATCH)

## 2014-06-13 MED ORDER — ALPRAZOLAM 0.25 MG PO TABS: 0.2500 mg | ORAL_TABLET | ORAL | Status: DC | PRN

## 2014-06-13 MED ORDER — BISACODYL 5 MG PO TBEC
5.0000 mg | DELAYED_RELEASE_TABLET | Freq: Once | ORAL | Status: AC
  Administered 2014-06-14: 5 mg via ORAL

## 2014-06-13 MED ORDER — TEMAZEPAM 15 MG PO CAPS: 15.0000 mg | ORAL_CAPSULE | Freq: Once | ORAL | Status: AC | PRN

## 2014-06-13 NOTE — Progress Notes (Signed)
ANTICOAGULATION CONSULT NOTE - Follow Up Consult  Pharmacy Consult for heparin Indication: CAD awaiting CABG  Labs:  Recent Labs  06/11/14 0025 06/11/14 0705  06/11/14 1235 06/12/14 0827 06/12/14 1828 06/13/14 0245  HGB  --  9.9*  --   --  11.0*  --   --   HCT  --  29.4*  --   --  33.3*  --   --   PLT  --  135*  --   --  172  --   --   LABPROT  --   --   --   --   --   --  13.7  INR  --   --   --   --   --   --  1.04  HEPARINUNFRC  --  >2.20*  < >  --  0.19* 0.28* 0.41  CREATININE  --  1.33*  --   --   --   --   --   TROPONINI <0.30 <0.30  --  <0.30  --   --   --   < > = values in this interval not displayed.   Assessment/Plan:  73yo female therapeutic on heparin after rate increases. Will continue gtt at current rate and confirm stable with additional level.   Wynona Neat, PharmD, BCPS  06/13/2014,4:40 AM

## 2014-06-13 NOTE — Progress Notes (Signed)
ANTICOAGULATION CONSULT NOTE - Follow Up Consult  Pharmacy Consult for heparin Indication: chest pain/ACS  Allergies  Allergen Reactions  . Carvedilol Other (See Comments)    confusion  . Codeine Other (See Comments)    confusion  . Metoprolol Palpitations    Patient Measurements: Height: 5\' 5"  (165.1 cm) Weight: 196 lb 6.4 oz (89.086 kg) IBW/kg (Calculated) : 57 Heparin Dosing Weight: 76kg  Vital Signs: Temp: 98.3 F (36.8 C) (12/19 0830) Temp Source: Oral (12/19 0830) BP: 150/57 mmHg (12/19 0830) Pulse Rate: 59 (12/19 0830)  Labs:  Recent Labs  06/11/14 0025  06/11/14 0705  06/11/14 1235 06/12/14 0827 06/12/14 1828 06/13/14 0245  HGB  --   < > 9.9*  --   --  11.0*  --  9.3*  HCT  --   --  29.4*  --   --  33.3*  --  28.4*  PLT  --   --  135*  --   --  172  --  128*  LABPROT  --   --   --   --   --   --   --  13.7  INR  --   --   --   --   --   --   --  1.04  HEPARINUNFRC  --   --  >2.20*  < >  --  0.19* 0.28* 0.41  CREATININE  --   --  1.33*  --   --   --   --  1.55*  TROPONINI <0.30  --  <0.30  --  <0.30  --   --   --   < > = values in this interval not displayed.  Estimated Creatinine Clearance: 36.7 mL/min (by C-G formula based on Cr of 1.55).  Assessment: 73 yo female presented as a transfer from [facility] on 06/10/2014 with complaints of chest pain.  Patient is being treated for NSTEMI, pharmacy has been consulted to dose IV heparin.    PMH: HTN, DM2, CKD, GERD   Anticoagulation/heme: Heparin for NSTEMI, s/p cardiac cath with findings of severe 3VCAD and EF 55%.  Heparin resumed 4h after TR band removal - had some issues with oozing from site, but now fine. Plans for CABG 12/21.    Heparin level is therapeutic at 0.44.  H/H low but stable, plt 128 low dropped again. No bleeding reported.    Nephrology: CrCl ~32-38 mL/min, lytes wnl.    Goal of Therapy:  Heparin level 0.3-0.7 units/ml Monitor platelets by anticoagulation  protocol: Yes   Plan - Continue heparin drip 1400 Units/hr - Daily HL and CBC - Monitor for signs and symptoms of bleeding  Hassie Bruce, Pharm. D. Clinical Pharmacy Resident Pager: [phone] Ph: [phone] 06/13/2014 10:23 AM

## 2014-06-13 NOTE — Progress Notes (Signed)
CARDIAC REHAB PHASE I   PRE:  Rate/Rhythm: 74 SR  BP:  Sitting: 161/67     SaO2: 96 RA  MODE:  Ambulation: 150 ft   POST:  Rate/Rhythm: 86  BP:  Sitting: 189/65  Pt walked 150 ft at a slow, steady pace.  Pt c/o of moderate SOB and leg weakness.  Pts SOB resolved with rest.  Returned pt to bed with bath at bedside per pt request.  Completed pt pre-op education.  Offered to start [facility] video, but pt stated she wanted to wait for her daughter to arrive to watch it with her.  Left video information for pt to dial in. [id number]  Lillia Dallas MS, ACSM RCEP 11:16 AM 06/13/2014

## 2014-06-13 NOTE — Progress Notes (Signed)
Patient Name: Joyce [name] Date of Encounter: 06/13/2014  Principal Problem:   NSTEMI (non-ST elevated myocardial infarction) Active Problems:   Chronic kidney disease (CKD), stage III (moderate)   HTN (hypertension)   Diabetes mellitus type 2 in obese   Primary Cardiologist: PJordan  Patient Profile: 20 w/ hx DM, HTN, CKD 2, FH CAD, tx from [facility] 12/16 w/ NSTEMI (ez abnl and dynamic ECG changes w/ chest pain there) cath 12/17 w/ CABG planned for 12/21.   SUBJECTIVE: No chest pain or SOB on current rx.  She is careful of not doing too much with her wrist.   OBJECTIVE Filed Vitals:   06/12/14 1509 06/12/14 2020 06/13/14 0434 06/13/14 0830  BP: 153/54 146/59 133/56 150/57  Pulse: 61 59 55 59  Temp: 98.3 F (36.8 C) 98.7 F (37.1 C) 98.2 F (36.8 C) 98.3 F (36.8 C)  TempSrc: Oral Oral Oral Oral  Resp:  16 16 20   Height:      Weight:   196 lb 6.4 oz (89.086 kg)   SpO2: 97% 98% 97% 99%    Intake/Output Summary (Last 24 hours) at 06/13/14 1000 Last data filed at 06/13/14 0900  Gross per 24 hour  Intake    840 ml  Output   1550 ml  Net   -710 ml   Filed Weights   06/10/14 2100 06/11/14 1500 06/13/14 0434  Weight: 194 lb 14.2 oz (88.4 kg) 193 lb 2 oz (87.6 kg) 196 lb 6.4 oz (89.086 kg)    PHYSICAL EXAM General: Well developed, well nourished, female in no acute distress. Head: Normocephalic, atraumatic.  Neck: Supple without bruits, JVD not elevated. Lungs:  Resp regular and unlabored, CTA. Heart: RRR, S1, S2,  Abdomen: Soft, non-tender, non-distended, BS + x 4.  Extremities:  no edema.  Left radial cath site without hematoma, small amount ecchymosis Neuro: Alert and oriented X 3. Moves all extremities spontaneously. Psych: Normal affect.  LABS: CBC:  Recent Labs  06/12/14 0827 06/13/14 0245  WBC 5.5 4.7  HGB 11.0* 9.3*  HCT 33.3* 28.4*  MCV 85.4 86.3  PLT 172 0000000*   Basic Metabolic Panel:  Recent Labs  06/11/14 0705 06/13/14 0245  NA 140  139  K 4.3 4.3  CL 105 105  CO2 23 22  GLUCOSE 113* 160*  BUN 25* 24*  CREATININE 1.33* 1.55*  CALCIUM 9.3 8.6   Cardiac Enzymes:  Recent Labs  06/11/14 0025 06/11/14 0705 06/11/14 1235  TROPONINI <0.30 <0.30 <0.30   Hemoglobin A1C:  Recent Labs  06/11/14 0025  HGBA1C 5.9*   Fasting Lipid Panel:  Recent Labs  06/11/14 0705  CHOL 177  HDL 48  LDLCALC 98  TRIG 157*  CHOLHDL 3.7   TELE:   SR, sinus brady  Current Medications:  . amLODipine  10 mg Oral Daily  . aspirin EC  81 mg Oral Daily  . atorvastatin  40 mg Oral q1800  . bisoprolol  5 mg Oral Daily  . hydrALAZINE  75 mg Oral 3 times per day  . insulin aspart  0-15 Units Subcutaneous TID WC  . insulin aspart  0-5 Units Subcutaneous QHS  . insulin aspart  4 Units Subcutaneous TID WC  . insulin glargine  20 Units Subcutaneous QHS  . primidone  50 mg Oral Daily  . senna-docusate  1 tablet Oral q morning - 10a  . sodium chloride  3 mL Intravenous Q12H  . traZODone  100 mg Oral QHS   .  heparin 1,400 Units/hr (06/12/14 2007)  . nitroGLYCERIN 10 mcg/min (06/11/14 1146)    ASSESSMENT AND PLAN: Principal Problem:   NSTEMI (non-ST elevated myocardial infarction) - for CABG Monday,  Symptoms controlled on current rx, continue ASA, heparin, high-dose statin, BB. EF 55% at cath, echo pending  Active Problems:   Chronic kidney disease (CKD), stage III (moderate) - Cr slightly increased.  Will recheck tomorrow.  Hold ACE-I    HTN (hypertension) - BP is elevated but better, PTA was on HCTZ 25 mg and lisinopril 20 mg, those are on hold, increase hydralazine to 75 mg TID and follow.      Diabetes mellitus type 2 in obese - on SSI plus Lantus partial dose, CBGs OK. Metformin on hold.  Plan - CABG per TCTS, rehab to see, begin education.    Adjust BP meds as noted. Anticipate resuming ACEi post op.  Brenyn Petrey S., MD,FACC 06/13/2014 10:00 AM

## 2014-06-14 LAB — GLUCOSE, CAPILLARY
GLUCOSE-CAPILLARY: 102 mg/dL — AB (ref 70–99)
Glucose-Capillary: 209 mg/dL — ABNORMAL HIGH (ref 70–99)
Glucose-Capillary: 123 mg/dL — ABNORMAL HIGH (ref 70–99)
Glucose-Capillary: 194 mg/dL — ABNORMAL HIGH (ref 70–99)

## 2014-06-14 LAB — CBC
HCT: 27.6 % — ABNORMAL LOW (ref 36.0–46.0)
HEMOGLOBIN: 9.4 g/dL — AB (ref 12.0–15.0)
MCH: 29.9 pg (ref 26.0–34.0)
MCHC: 34.1 g/dL (ref 30.0–36.0)
MCV: 87.9 fL (ref 78.0–100.0)
PLATELETS: 126 10*3/uL — AB (ref 150–400)
RBC: 3.14 MIL/uL — AB (ref 3.87–5.11)
RDW: 13.6 % (ref 11.5–15.5)
WBC: 4 10*3/uL (ref 4.0–10.5)

## 2014-06-14 LAB — BASIC METABOLIC PANEL
ANION GAP: 13 (ref 5–15)
BUN: 26 mg/dL — ABNORMAL HIGH (ref 6–23)
CALCIUM: 8.6 mg/dL (ref 8.4–10.5)
CO2: 21 meq/L (ref 19–32)
Chloride: 107 mEq/L (ref 96–112)
Creatinine, Ser: 1.56 mg/dL — ABNORMAL HIGH (ref 0.50–1.10)
GFR calc Af Amer: 37 mL/min — ABNORMAL LOW (ref >90)
GFR, EST NON AFRICAN AMERICAN: 32 mL/min — AB (ref >90)
GLUCOSE: 131 mg/dL — AB (ref 70–99)
POTASSIUM: 4.4 meq/L (ref 3.7–5.3)
SODIUM: 141 meq/L (ref 137–147)

## 2014-06-14 LAB — BLOOD GAS, ARTERIAL
Acid-base deficit: 3.1 mmol/L — ABNORMAL HIGH (ref 0.0–2.0)
Bicarbonate: 21.1 mEq/L (ref 20.0–24.0)
Drawn by: 39899
FIO2: 0.21 %
O2 Saturation: 96.9 %
Patient temperature: 98.6
TCO2: 22.2 mmol/L (ref 0–100)
pCO2 arterial: 35.4 mmHg (ref 35.0–45.0)
pH, Arterial: 7.392 (ref 7.350–7.450)
pO2, Arterial: 93 mmHg (ref 80.0–100.0)

## 2014-06-14 LAB — APTT: aPTT: 83 seconds — ABNORMAL HIGH (ref 24–37)

## 2014-06-14 LAB — HEPARIN LEVEL (UNFRACTIONATED): HEPARIN UNFRACTIONATED: 0.53 [IU]/mL (ref 0.30–0.70)

## 2014-06-14 MED ORDER — CEFUROXIME SODIUM 750 MG IJ SOLR
750.0000 mg | INTRAMUSCULAR | Status: DC
  Filled 2014-06-14: qty 750

## 2014-06-14 MED ORDER — CHLORHEXIDINE GLUCONATE 4 % EX LIQD
60.0000 mL | Freq: Once | CUTANEOUS | Status: AC
  Administered 2014-06-14: 4 via TOPICAL
  Filled 2014-06-14: qty 60

## 2014-06-14 MED ORDER — PLASMA-LYTE 148 IV SOLN
INTRAVENOUS | Status: AC
  Administered 2014-06-15: 500 mL
  Filled 2014-06-14: qty 2.5

## 2014-06-14 MED ORDER — DEXTROSE 5 % IV SOLN
0.0000 ug/min | INTRAVENOUS | Status: DC
  Filled 2014-06-14: qty 4

## 2014-06-14 MED ORDER — BISACODYL 5 MG PO TBEC
5.0000 mg | DELAYED_RELEASE_TABLET | Freq: Once | ORAL | Status: AC
  Filled 2014-06-14: qty 1

## 2014-06-14 MED ORDER — POTASSIUM CHLORIDE 2 MEQ/ML IV SOLN
80.0000 meq | INTRAVENOUS | Status: DC
  Filled 2014-06-14: qty 40

## 2014-06-14 MED ORDER — INSULIN REGULAR HUMAN 100 UNIT/ML IJ SOLN
INTRAMUSCULAR | Status: DC
  Filled 2014-06-14: qty 2.5

## 2014-06-14 MED ORDER — MAGNESIUM SULFATE 50 % IJ SOLN
40.0000 meq | INTRAMUSCULAR | Status: DC
  Filled 2014-06-14: qty 10

## 2014-06-14 MED ORDER — VANCOMYCIN HCL 10 G IV SOLR
1250.0000 mg | INTRAVENOUS | Status: AC
  Administered 2014-06-15: 1250 mg via INTRAVENOUS
  Filled 2014-06-14: qty 1250

## 2014-06-14 MED ORDER — SODIUM CHLORIDE 0.9 % IV SOLN
INTRAVENOUS | Status: AC
  Administered 2014-06-15: 69.8 mL/h via INTRAVENOUS
  Filled 2014-06-14: qty 40

## 2014-06-14 MED ORDER — DEXTROSE 5 % IV SOLN
30.0000 ug/min | INTRAVENOUS | Status: DC
  Filled 2014-06-14: qty 2

## 2014-06-14 MED ORDER — DEXMEDETOMIDINE HCL IN NACL 400 MCG/100ML IV SOLN
0.1000 ug/kg/h | INTRAVENOUS | Status: AC
Start: 2014-06-15 — End: 2014-06-15
  Administered 2014-06-15: 0.7 ug/kg/h via INTRAVENOUS
  Filled 2014-06-14: qty 100

## 2014-06-14 MED ORDER — CHLORHEXIDINE GLUCONATE 4 % EX LIQD
60.0000 mL | Freq: Once | CUTANEOUS | Status: AC
  Administered 2014-06-15: 4 via TOPICAL
  Filled 2014-06-14: qty 60

## 2014-06-14 MED ORDER — DEXTROSE 5 % IV SOLN
1.5000 g | INTRAVENOUS | Status: AC
  Administered 2014-06-15: 1.5 g via INTRAVENOUS
  Administered 2014-06-15: .75 g via INTRAVENOUS
  Filled 2014-06-14: qty 1.5

## 2014-06-14 MED ORDER — DOPAMINE-DEXTROSE 3.2-5 MG/ML-% IV SOLN
0.0000 ug/kg/min | INTRAVENOUS | Status: AC
Start: 2014-06-15 — End: 2014-06-15
  Administered 2014-06-15: 2 ug/kg/min via INTRAVENOUS
  Filled 2014-06-14: qty 250

## 2014-06-14 MED ORDER — NITROGLYCERIN IN D5W 200-5 MCG/ML-% IV SOLN
2.0000 ug/min | INTRAVENOUS | Status: AC
  Administered 2014-06-15: 5 ug/min via INTRAVENOUS
  Filled 2014-06-14: qty 250

## 2014-06-14 MED ORDER — HEPARIN SODIUM (PORCINE) 1000 UNIT/ML IJ SOLN
INTRAMUSCULAR | Status: DC
  Filled 2014-06-14: qty 30

## 2014-06-14 NOTE — Progress Notes (Signed)
Patient Name: Joyce [name] Date of Encounter: 06/14/2014  Principal Problem:   NSTEMI (non-ST elevated myocardial infarction) Active Problems:   Chronic kidney disease (CKD), stage III (moderate)   HTN (hypertension)   Diabetes mellitus type 2 in obese   Primary Cardiologist: PJordan  Patient Profile: 82 w/ hx DM, HTN, CKD 2, FH CAD, tx from [facility] 12/16 w/ NSTEMI (ez abnl and dynamic ECG changes w/ chest pain there) cath 12/17 w/ CABG planned for 12/21.   SUBJECTIVE: No chest pain or SOB on current rx.  She is careful of not doing too much with her wrist.   OBJECTIVE Filed Vitals:   06/13/14 2100 06/14/14 0000 06/14/14 0400 06/14/14 0800  BP: 159/66 126/61 146/55 164/54  Pulse:  57 57 59  Temp:  98.8 F (37.1 C) 98.2 F (36.8 C) 98 F (36.7 C)  TempSrc:  Oral Oral Oral  Resp:  18 18 20   Height:      Weight:  190 lb 9.6 oz (86.456 kg)    SpO2:  98% 96% 99%    Intake/Output Summary (Last 24 hours) at 06/14/14 1047 Last data filed at 06/14/14 0910  Gross per 24 hour  Intake   1284 ml  Output   3151 ml  Net  -1867 ml   Filed Weights   06/11/14 1500 06/13/14 0434 06/14/14 0000  Weight: 193 lb 2 oz (87.6 kg) 196 lb 6.4 oz (89.086 kg) 190 lb 9.6 oz (86.456 kg)    PHYSICAL EXAM General: Well developed, well nourished, female in no acute distress. Head: Normocephalic, atraumatic.  Neck: Supple without bruits, JVD not elevated. Lungs:  Resp regular and unlabored, CTA. Heart: RRR, S1, S2,  Abdomen: Soft, non-tender, non-distended, BS + x 4.  Extremities:  no edema.  Left radial cath site without hematoma, small amount ecchymosis Neuro: Alert and oriented X 3. Moves all extremities spontaneously. Psych: Normal affect.  LABS: CBC:  Recent Labs  06/13/14 1319 06/14/14 0352  WBC 4.4 4.0  HGB 10.1* 9.4*  HCT 30.6* 27.6*  MCV 86.0 87.9  PLT 134* 123XX123*   Basic Metabolic Panel:  Recent Labs  06/13/14 1319 06/14/14 0352  NA 139 141  K 4.4 4.4  CL 102  107  CO2 24 21  GLUCOSE 150* 131*  BUN 24* 26*  CREATININE 1.55* 1.56*  CALCIUM 9.1 8.6   Cardiac Enzymes:  Recent Labs  06/11/14 1235  TROPONINI <0.30   Hemoglobin A1C:  Recent Labs  06/13/14 0245  HGBA1C 5.9*   Fasting Lipid Panel: No results for input(s): CHOL, HDL, LDLCALC, TRIG, CHOLHDL, LDLDIRECT in the last 72 hours. TELE:   SR, sinus brady  Current Medications:  . amLODipine  10 mg Oral Daily  . aspirin EC  81 mg Oral Daily  . atorvastatin  40 mg Oral q1800  . bisacodyl  5 mg Oral Once  . bisoprolol  5 mg Oral Daily  . hydrALAZINE  75 mg Oral 3 times per day  . insulin aspart  0-15 Units Subcutaneous TID WC  . insulin aspart  0-5 Units Subcutaneous QHS  . insulin aspart  4 Units Subcutaneous TID WC  . insulin glargine  20 Units Subcutaneous QHS  . primidone  50 mg Oral Daily  . senna-docusate  1 tablet Oral q morning - 10a  . sodium chloride  3 mL Intravenous Q12H  . traZODone  100 mg Oral QHS   . heparin 1,400 Units/hr (06/14/14 0956)  . nitroGLYCERIN 10  mcg/min (06/11/14 1146)    ASSESSMENT AND PLAN: Principal Problem:   NSTEMI (non-ST elevated myocardial infarction) - for CABG Monday,  Symptoms controlled on current rx, continue ASA, heparin, high-dose statin, BB. EF 55% at cath,   Active Problems:   Chronic kidney disease (CKD), stage III (moderate) - Cr stable.  Hold ACE-I for now  HTN (hypertension) - BP is elevated but better, PTA was on HCTZ 25 mg and lisinopril 20 mg, those are on hold, increased hydralazine to 75 mg TID and follow.  Variavble BPs. Will not change now as surgery is tomorrow.   Diabetes mellitus type 2 in obese - on SSI plus Lantus partial dose, CBGs OK. Metformin on hold.  Plan - CABG per TCTS, rehab to see, begin education.  Watched video. COncerned that she has no one to stay with her at night after discharge.  Consider rehab facility.   Adjust BP meds as noted. Anticipate resuming ACEi post op.  Jettie Booze.,  MD,FACC 06/14/2014 10:47 AM

## 2014-06-14 NOTE — Progress Notes (Signed)
ANTICOAGULATION CONSULT NOTE - Follow Up Consult  Pharmacy Consult for heparin Indication:   Allergies  Allergen Reactions  . Carvedilol Other (See Comments)    confusion  . Codeine Other (See Comments)    confusion  . Metoprolol Palpitations    Patient Measurements: Height: 5\' 5"  (165.1 cm) Weight: 190 lb 9.6 oz (86.456 kg) IBW/kg (Calculated) : 57 Heparin Dosing Weight: 76kg  Vital Signs: Temp: 98 F (36.7 C) (12/20 0800) Temp Source: Oral (12/20 0800) BP: 164/54 mmHg (12/20 0800) Pulse Rate: 59 (12/20 0800)  Labs:  Recent Labs  06/11/14 1235  06/13/14 0245 06/13/14 1030 06/13/14 1319 06/14/14 0352  HGB  --   < > 9.3*  --  10.1* 9.4*  HCT  --   < > 28.4*  --  30.6* 27.6*  PLT  --   < > 128*  --  134* 126*  LABPROT  --   --  13.7  --   --   --   INR  --   --  1.04  --   --   --   HEPARINUNFRC  --   < > 0.41 0.44  --  0.53  CREATININE  --   --  1.55*  --  1.55* 1.56*  TROPONINI <0.30  --   --   --   --   --   < > = values in this interval not displayed.  Estimated Creatinine Clearance: 35.9 mL/min (by C-G formula based on Cr of 1.56).  Assessment: 73 yo female presented as a transfer from [facility] on 06/10/2014 with complaints of chest pain.  Patient is being treated for NSTEMI, pharmacy has been consulted to dose IV heparin.    PMH: HTN, DM2, CKD, GERD   Anticoagulation/heme: heparin for nst-now s/p cardiac cath with findings of severe 3VCAD and EF 55%.  Heparin resumed 4h after TR band removal- had some issues with oozing from site, but now fine. Plans for CABG 12/21.  Heparin level is therapeutic at 0.53.  H/H low but stable, plt 128 low dropped again. No bleeding reported.    Nephrology: CrCl ~32-38 mL/min, lytes wnl.    PTA Medication Issues: metformin, HCTZ, lisinopril  Best Practices: hep gtt  Goal of Therapy:  Heparin level 0.3-0.7 units/ml Monitor platelets by anticoagulation protocol: Yes   Plan - Continue  heparin drip 1400 Units/hr - Daily HL and CBC - Monitor for signs and symptoms of bleeding - Plans to resume ACEI post-op - follow up  Hassie Bruce, Pharm. D. Clinical Pharmacy Resident Pager: [phone] Ph: [phone] 06/14/2014 9:47 AM

## 2014-06-15 ENCOUNTER — Encounter (HOSPITAL_COMMUNITY)
Admission: EM | Disposition: A | Discharge status: Skilled Nursing Facility | Payer: Medicare Other | Source: Other Acute Inpatient Hospital | Attending: Cardiothoracic Surgery

## 2014-06-15 ENCOUNTER — Inpatient Hospital Stay (HOSPITAL_COMMUNITY): Payer: Medicare Other

## 2014-06-15 ENCOUNTER — Inpatient Hospital Stay (HOSPITAL_COMMUNITY): Payer: Medicare Other | Admitting: Anesthesiology

## 2014-06-15 DIAGNOSIS — Z951 Presence of aortocoronary bypass graft: Secondary | ICD-10-CM

## 2014-06-15 HISTORY — PX: INTRAOPERATIVE TRANSESOPHAGEAL ECHOCARDIOGRAM: SHX5062

## 2014-06-15 HISTORY — PX: CORONARY ARTERY BYPASS GRAFT: SHX141

## 2014-06-15 LAB — POCT I-STAT, CHEM 8
BUN: 19 mg/dL (ref 6–23)
BUN: 18 mg/dL (ref 6–23)
BUN: 18 mg/dL (ref 6–23)
BUN: 18 mg/dL (ref 6–23)
BUN: 18 mg/dL (ref 6–23)
BUN: 24 mg/dL — AB (ref 6–23)
CALCIUM ION: 1.08 mmol/L — AB (ref 1.13–1.30)
CALCIUM ION: 1.05 mmol/L — AB (ref 1.13–1.30)
CREATININE: 1.2 mg/dL — AB (ref 0.50–1.10)
CREATININE: 1.4 mg/dL — AB (ref 0.50–1.10)
CREATININE: 1.3 mg/dL — AB (ref 0.50–1.10)
Calcium, Ion: 1.3 mmol/L (ref 1.13–1.30)
Calcium, Ion: 1.26 mmol/L (ref 1.13–1.30)
Calcium, Ion: 1.08 mmol/L — ABNORMAL LOW (ref 1.13–1.30)
Calcium, Ion: 1.17 mmol/L (ref 1.13–1.30)
Chloride: 107 mEq/L (ref 96–112)
Chloride: 107 mEq/L (ref 96–112)
Chloride: 106 mEq/L (ref 96–112)
Chloride: 102 mEq/L (ref 96–112)
Chloride: 105 mEq/L (ref 96–112)
Chloride: 110 mEq/L (ref 96–112)
Creatinine, Ser: 1.3 mg/dL — ABNORMAL HIGH (ref 0.50–1.10)
Creatinine, Ser: 1.4 mg/dL — ABNORMAL HIGH (ref 0.50–1.10)
Creatinine, Ser: 1.4 mg/dL — ABNORMAL HIGH (ref 0.50–1.10)
GLUCOSE: 155 mg/dL — AB (ref 70–99)
GLUCOSE: 141 mg/dL — AB (ref 70–99)
GLUCOSE: 147 mg/dL — AB (ref 70–99)
GLUCOSE: 165 mg/dL — AB (ref 70–99)
Glucose, Bld: 156 mg/dL — ABNORMAL HIGH (ref 70–99)
Glucose, Bld: 148 mg/dL — ABNORMAL HIGH (ref 70–99)
HCT: 24 % — ABNORMAL LOW (ref 36.0–46.0)
HCT: 25 % — ABNORMAL LOW (ref 36.0–46.0)
HCT: 23 % — ABNORMAL LOW (ref 36.0–46.0)
HCT: 32 % — ABNORMAL LOW (ref 36.0–46.0)
HEMATOCRIT: 21 % — AB (ref 36.0–46.0)
HEMATOCRIT: 25 % — AB (ref 36.0–46.0)
HEMOGLOBIN: 7.8 g/dL — AB (ref 12.0–15.0)
HEMOGLOBIN: 8.5 g/dL — AB (ref 12.0–15.0)
Hemoglobin: 8.2 g/dL — ABNORMAL LOW (ref 12.0–15.0)
Hemoglobin: 7.1 g/dL — ABNORMAL LOW (ref 12.0–15.0)
Hemoglobin: 8.5 g/dL — ABNORMAL LOW (ref 12.0–15.0)
Hemoglobin: 10.9 g/dL — ABNORMAL LOW (ref 12.0–15.0)
Potassium: 4 mEq/L (ref 3.7–5.3)
Potassium: 4.2 mEq/L (ref 3.7–5.3)
Potassium: 5 mEq/L (ref 3.7–5.3)
Potassium: 4.6 mEq/L (ref 3.7–5.3)
Potassium: 5.2 mEq/L (ref 3.7–5.3)
Potassium: 4.5 mEq/L (ref 3.7–5.3)
SODIUM: 141 meq/L (ref 137–147)
SODIUM: 143 meq/L (ref 137–147)
Sodium: 140 mEq/L (ref 137–147)
Sodium: 138 mEq/L (ref 137–147)
Sodium: 136 mEq/L — ABNORMAL LOW (ref 137–147)
Sodium: 141 mEq/L (ref 137–147)
TCO2: 25 mmol/L (ref 0–100)
TCO2: 22 mmol/L (ref 0–100)
TCO2: 21 mmol/L (ref 0–100)
TCO2: 18 mmol/L (ref 0–100)
TCO2: 21 mmol/L (ref 0–100)
TCO2: 19 mmol/L (ref 0–100)

## 2014-06-15 LAB — POCT I-STAT 3, ART BLOOD GAS (G3+)
ACID-BASE DEFICIT: 2 mmol/L (ref 0.0–2.0)
ACID-BASE DEFICIT: 3 mmol/L — AB (ref 0.0–2.0)
ACID-BASE DEFICIT: 4 mmol/L — AB (ref 0.0–2.0)
Acid-base deficit: 4 mmol/L — ABNORMAL HIGH (ref 0.0–2.0)
Acid-base deficit: 4 mmol/L — ABNORMAL HIGH (ref 0.0–2.0)
Acid-base deficit: 6 mmol/L — ABNORMAL HIGH (ref 0.0–2.0)
BICARBONATE: 22.1 meq/L (ref 20.0–24.0)
Bicarbonate: 24 mEq/L (ref 20.0–24.0)
Bicarbonate: 22.6 mEq/L (ref 20.0–24.0)
Bicarbonate: 20.9 mEq/L (ref 20.0–24.0)
Bicarbonate: 21.7 mEq/L (ref 20.0–24.0)
Bicarbonate: 19.7 mEq/L — ABNORMAL LOW (ref 20.0–24.0)
O2 Saturation: 100 %
O2 Saturation: 100 %
O2 Saturation: 100 %
O2 Saturation: 95 %
O2 Saturation: 96 %
O2 Saturation: 97 %
PCO2 ART: 46.5 mmHg — AB (ref 35.0–45.0)
PCO2 ART: 38 mmHg (ref 35.0–45.0)
PH ART: 7.346 — AB (ref 7.350–7.450)
PH ART: 7.318 — AB (ref 7.350–7.450)
PO2 ART: 106 mmHg — AB (ref 80.0–100.0)
Patient temperature: 37.4
Patient temperature: 37.6
TCO2: 25 mmol/L (ref 0–100)
TCO2: 24 mmol/L (ref 0–100)
TCO2: 23 mmol/L (ref 0–100)
TCO2: 22 mmol/L (ref 0–100)
TCO2: 23 mmol/L (ref 0–100)
TCO2: 21 mmol/L (ref 0–100)
pCO2 arterial: 42.6 mmHg (ref 35.0–45.0)
pCO2 arterial: 43 mmHg (ref 35.0–45.0)
pCO2 arterial: 42.6 mmHg (ref 35.0–45.0)
pCO2 arterial: 40.8 mmHg (ref 35.0–45.0)
pH, Arterial: 7.321 — ABNORMAL LOW (ref 7.350–7.450)
pH, Arterial: 7.333 — ABNORMAL LOW (ref 7.350–7.450)
pH, Arterial: 7.319 — ABNORMAL LOW (ref 7.350–7.450)
pH, Arterial: 7.295 — ABNORMAL LOW (ref 7.350–7.450)
pO2, Arterial: 452 mmHg — ABNORMAL HIGH (ref 80.0–100.0)
pO2, Arterial: 342 mmHg — ABNORMAL HIGH (ref 80.0–100.0)
pO2, Arterial: 204 mmHg — ABNORMAL HIGH (ref 80.0–100.0)
pO2, Arterial: 77 mmHg — ABNORMAL LOW (ref 80.0–100.0)
pO2, Arterial: 92 mmHg (ref 80.0–100.0)

## 2014-06-15 LAB — GLUCOSE, CAPILLARY
GLUCOSE-CAPILLARY: 113 mg/dL — AB (ref 70–99)
GLUCOSE-CAPILLARY: 127 mg/dL — AB (ref 70–99)
Glucose-Capillary: 79 mg/dL (ref 70–99)
Glucose-Capillary: 98 mg/dL (ref 70–99)
Glucose-Capillary: 107 mg/dL — ABNORMAL HIGH (ref 70–99)
Glucose-Capillary: 116 mg/dL — ABNORMAL HIGH (ref 70–99)
Glucose-Capillary: 141 mg/dL — ABNORMAL HIGH (ref 70–99)
Glucose-Capillary: 164 mg/dL — ABNORMAL HIGH (ref 70–99)

## 2014-06-15 LAB — CBC
HCT: 26.8 % — ABNORMAL LOW (ref 36.0–46.0)
HCT: 32 % — ABNORMAL LOW (ref 36.0–46.0)
HCT: 32.3 % — ABNORMAL LOW (ref 36.0–46.0)
HEMOGLOBIN: 9.1 g/dL — AB (ref 12.0–15.0)
Hemoglobin: 10.9 g/dL — ABNORMAL LOW (ref 12.0–15.0)
Hemoglobin: 10.9 g/dL — ABNORMAL LOW (ref 12.0–15.0)
MCH: 29.1 pg (ref 26.0–34.0)
MCH: 29.1 pg (ref 26.0–34.0)
MCH: 28.8 pg (ref 26.0–34.0)
MCHC: 34 g/dL (ref 30.0–36.0)
MCHC: 34.1 g/dL (ref 30.0–36.0)
MCHC: 33.7 g/dL (ref 30.0–36.0)
MCV: 85.6 fL (ref 78.0–100.0)
MCV: 85.3 fL (ref 78.0–100.0)
MCV: 85.4 fL (ref 78.0–100.0)
PLATELETS: 99 10*3/uL — AB (ref 150–400)
Platelets: 98 10*3/uL — ABNORMAL LOW (ref 150–400)
Platelets: 122 10*3/uL — ABNORMAL LOW (ref 150–400)
RBC: 3.13 MIL/uL — AB (ref 3.87–5.11)
RBC: 3.75 MIL/uL — AB (ref 3.87–5.11)
RBC: 3.78 MIL/uL — ABNORMAL LOW (ref 3.87–5.11)
RDW: 13.4 % (ref 11.5–15.5)
RDW: 13.5 % (ref 11.5–15.5)
RDW: 13.8 % (ref 11.5–15.5)
WBC: 3.4 10*3/uL — AB (ref 4.0–10.5)
WBC: 5.7 10*3/uL (ref 4.0–10.5)
WBC: 6.6 10*3/uL (ref 4.0–10.5)

## 2014-06-15 LAB — POCT I-STAT 4, (NA,K, GLUC, HGB,HCT)
Glucose, Bld: 121 mg/dL — ABNORMAL HIGH (ref 70–99)
HCT: 30 % — ABNORMAL LOW (ref 36.0–46.0)
HEMOGLOBIN: 10.2 g/dL — AB (ref 12.0–15.0)
POTASSIUM: 4.3 meq/L (ref 3.7–5.3)
Sodium: 142 mEq/L (ref 137–147)

## 2014-06-15 LAB — POCT I-STAT GLUCOSE
Glucose, Bld: 141 mg/dL — ABNORMAL HIGH (ref 70–99)
Operator id: 173792

## 2014-06-15 LAB — PLATELET COUNT: Platelets: 97 10*3/uL — ABNORMAL LOW (ref 150–400)

## 2014-06-15 LAB — HEPARIN LEVEL (UNFRACTIONATED): HEPARIN UNFRACTIONATED: 0.49 [IU]/mL (ref 0.30–0.70)

## 2014-06-15 LAB — APTT: aPTT: 31 seconds (ref 24–37)

## 2014-06-15 LAB — MAGNESIUM: Magnesium: 2.8 mg/dL — ABNORMAL HIGH (ref 1.5–2.5)

## 2014-06-15 LAB — HEMOGLOBIN AND HEMATOCRIT, BLOOD
HCT: 25.2 % — ABNORMAL LOW (ref 36.0–46.0)
Hemoglobin: 8.5 g/dL — ABNORMAL LOW (ref 12.0–15.0)

## 2014-06-15 LAB — PROTIME-INR
INR: 1.21 (ref 0.00–1.49)
Prothrombin Time: 15.5 seconds — ABNORMAL HIGH (ref 11.6–15.2)

## 2014-06-15 LAB — CREATININE, SERUM
Creatinine, Ser: 1.39 mg/dL — ABNORMAL HIGH (ref 0.50–1.10)
GFR calc Af Amer: 43 mL/min — ABNORMAL LOW (ref >90)
GFR calc non Af Amer: 37 mL/min — ABNORMAL LOW (ref >90)

## 2014-06-15 LAB — PREPARE RBC (CROSSMATCH)

## 2014-06-15 SURGERY — CORONARY ARTERY BYPASS GRAFTING (CABG)
Anesthesia: General | Site: Chest

## 2014-06-15 MED ORDER — VANCOMYCIN HCL IN DEXTROSE 1-5 GM/200ML-% IV SOLN
1000.0000 mg | Freq: Once | INTRAVENOUS | Status: AC
  Administered 2014-06-15: 1000 mg via INTRAVENOUS
  Filled 2014-06-15: qty 200

## 2014-06-15 MED ORDER — LACTATED RINGERS IV SOLN
INTRAVENOUS | Status: DC | PRN
  Administered 2014-06-15: 07:00:00 via INTRAVENOUS

## 2014-06-15 MED ORDER — ALBUMIN HUMAN 5 % IV SOLN
250.0000 mL | INTRAVENOUS | Status: AC | PRN
  Administered 2014-06-15 (×3): 250 mL via INTRAVENOUS
  Filled 2014-06-15: qty 250

## 2014-06-15 MED ORDER — DEXTROSE 5 % IV SOLN
1.5000 g | Freq: Two times a day (BID) | INTRAVENOUS | Status: DC
  Administered 2014-06-15 – 2014-06-16 (×3): 1.5 g via INTRAVENOUS
  Filled 2014-06-15 (×4): qty 1.5

## 2014-06-15 MED ORDER — DEXTROSE 5 % IV SOLN
10.0000 mg | INTRAVENOUS | Status: DC | PRN
  Administered 2014-06-15: 25 ug/min via INTRAVENOUS

## 2014-06-15 MED ORDER — HEMOSTATIC AGENTS (NO CHARGE) OPTIME
TOPICAL | Status: DC | PRN
  Administered 2014-06-15: 1 via TOPICAL

## 2014-06-15 MED ORDER — PROPOFOL 10 MG/ML IV BOLUS
INTRAVENOUS | Status: DC | PRN
  Administered 2014-06-15: 60 mg via INTRAVENOUS

## 2014-06-15 MED ORDER — ACETAMINOPHEN 160 MG/5ML PO SOLN
1000.0000 mg | Freq: Four times a day (QID) | ORAL | Status: AC
  Filled 2014-06-15: qty 40

## 2014-06-15 MED ORDER — LACTATED RINGERS IV SOLN: 500.0000 mL | Freq: Once | INTRAVENOUS | Status: AC | PRN

## 2014-06-15 MED ORDER — SODIUM CHLORIDE 0.9 % IV SOLN
INTRAVENOUS | Status: DC
  Administered 2014-06-15: 20 mL/h via INTRAVENOUS

## 2014-06-15 MED ORDER — CETYLPYRIDINIUM CHLORIDE 0.05 % MT LIQD
7.0000 mL | Freq: Two times a day (BID) | OROMUCOSAL | Status: DC
  Administered 2014-06-15 – 2014-06-16 (×3): 7 mL via OROMUCOSAL

## 2014-06-15 MED ORDER — FENTANYL CITRATE 0.05 MG/ML IJ SOLN
INTRAMUSCULAR | Status: AC
  Filled 2014-06-15: qty 5

## 2014-06-15 MED ORDER — POTASSIUM CHLORIDE 10 MEQ/50ML IV SOLN: 10.0000 meq | INTRAVENOUS | Status: AC

## 2014-06-15 MED ORDER — SODIUM CHLORIDE 0.9 % IJ SOLN
OROMUCOSAL | Status: DC | PRN
  Administered 2014-06-15: 4 mL via TOPICAL

## 2014-06-15 MED ORDER — ROCURONIUM BROMIDE 50 MG/5ML IV SOLN
INTRAVENOUS | Status: AC
  Filled 2014-06-15: qty 2

## 2014-06-15 MED ORDER — DEXTROSE 5 % IV SOLN
0.0000 ug/min | INTRAVENOUS | Status: DC
  Filled 2014-06-15: qty 2

## 2014-06-15 MED ORDER — FENTANYL CITRATE 0.05 MG/ML IJ SOLN
INTRAMUSCULAR | Status: DC | PRN
  Administered 2014-06-15 (×4): 100 ug via INTRAVENOUS
  Administered 2014-06-15 (×2): 150 ug via INTRAVENOUS
  Administered 2014-06-15 (×2): 100 ug via INTRAVENOUS
  Administered 2014-06-15: 150 ug via INTRAVENOUS
  Administered 2014-06-15: 100 ug via INTRAVENOUS
  Administered 2014-06-15: 150 ug via INTRAVENOUS
  Administered 2014-06-15: 50 ug via INTRAVENOUS
  Administered 2014-06-15: 100 ug via INTRAVENOUS
  Administered 2014-06-15: 250 ug via INTRAVENOUS
  Administered 2014-06-15: 50 ug via INTRAVENOUS

## 2014-06-15 MED ORDER — SODIUM CHLORIDE 0.9 % IJ SOLN
INTRAMUSCULAR | Status: AC
  Filled 2014-06-15: qty 10

## 2014-06-15 MED ORDER — ASPIRIN 81 MG PO CHEW: 324.0000 mg | CHEWABLE_TABLET | Freq: Every day | ORAL | Status: DC

## 2014-06-15 MED ORDER — BISACODYL 5 MG PO TBEC
10.0000 mg | DELAYED_RELEASE_TABLET | Freq: Every day | ORAL | Status: DC
  Administered 2014-06-16 – 2014-06-22 (×6): 10 mg via ORAL
  Filled 2014-06-15 (×7): qty 2

## 2014-06-15 MED ORDER — NITROGLYCERIN 0.2 MG/ML ON CALL CATH LAB
INTRAVENOUS | Status: DC | PRN
  Administered 2014-06-15 (×3): 60 ug via INTRAVENOUS

## 2014-06-15 MED ORDER — MAGNESIUM SULFATE 4 GM/100ML IV SOLN
4.0000 g | Freq: Once | INTRAVENOUS | Status: AC
  Administered 2014-06-15: 4 g via INTRAVENOUS
  Filled 2014-06-15: qty 100

## 2014-06-15 MED ORDER — NITROGLYCERIN IN D5W 200-5 MCG/ML-% IV SOLN
0.0000 ug/min | INTRAVENOUS | Status: DC
  Administered 2014-06-15: 35 ug/min via INTRAVENOUS

## 2014-06-15 MED ORDER — HEPARIN SODIUM (PORCINE) 1000 UNIT/ML IJ SOLN
INTRAMUSCULAR | Status: DC | PRN
  Administered 2014-06-15: 26000 [IU] via INTRAVENOUS
  Administered 2014-06-15 (×2): 2000 [IU] via INTRAVENOUS

## 2014-06-15 MED ORDER — ACETAMINOPHEN 650 MG RE SUPP
650.0000 mg | Freq: Once | RECTAL | Status: AC
  Administered 2014-06-15: 650 mg via RECTAL

## 2014-06-15 MED ORDER — ACETAMINOPHEN 500 MG PO TABS
1000.0000 mg | ORAL_TABLET | Freq: Four times a day (QID) | ORAL | Status: AC
  Administered 2014-06-15 – 2014-06-20 (×17): 1000 mg via ORAL
  Filled 2014-06-15 (×20): qty 2

## 2014-06-15 MED ORDER — SODIUM CHLORIDE 0.9 % IV SOLN
250.0000 [IU] | INTRAVENOUS | Status: DC | PRN
  Administered 2014-06-15: 2.9 [IU]/h via INTRAVENOUS

## 2014-06-15 MED ORDER — 0.9 % SODIUM CHLORIDE (POUR BTL) OPTIME
TOPICAL | Status: DC | PRN
  Administered 2014-06-15: 1000 mL

## 2014-06-15 MED ORDER — SODIUM CHLORIDE 0.9 % IV SOLN
20.0000 ug | Freq: Once | INTRAVENOUS | Status: AC
  Administered 2014-06-15: 20 ug via INTRAVENOUS
  Filled 2014-06-15: qty 5

## 2014-06-15 MED ORDER — SODIUM CHLORIDE 0.9 % IJ SOLN
3.0000 mL | Freq: Two times a day (BID) | INTRAMUSCULAR | Status: DC
  Administered 2014-06-16 – 2014-06-21 (×12): 3 mL via INTRAVENOUS

## 2014-06-15 MED ORDER — DOCUSATE SODIUM 100 MG PO CAPS
200.0000 mg | ORAL_CAPSULE | Freq: Every day | ORAL | Status: DC
  Administered 2014-06-16 – 2014-06-22 (×7): 200 mg via ORAL
  Filled 2014-06-15 (×8): qty 2

## 2014-06-15 MED ORDER — FAMOTIDINE IN NACL 20-0.9 MG/50ML-% IV SOLN
20.0000 mg | Freq: Two times a day (BID) | INTRAVENOUS | Status: DC
  Administered 2014-06-15: 20 mg via INTRAVENOUS

## 2014-06-15 MED ORDER — MIDAZOLAM HCL 2 MG/2ML IJ SOLN
2.0000 mg | INTRAMUSCULAR | Status: DC | PRN
  Administered 2014-06-15: 2 mg via INTRAVENOUS
  Filled 2014-06-15: qty 2

## 2014-06-15 MED ORDER — ASPIRIN EC 325 MG PO TBEC
325.0000 mg | DELAYED_RELEASE_TABLET | Freq: Every day | ORAL | Status: DC
  Administered 2014-06-16 – 2014-06-22 (×7): 325 mg via ORAL
  Filled 2014-06-15 (×7): qty 1

## 2014-06-15 MED ORDER — HEPARIN SODIUM (PORCINE) 1000 UNIT/ML IJ SOLN
INTRAMUSCULAR | Status: AC
  Filled 2014-06-15: qty 1

## 2014-06-15 MED ORDER — SODIUM CHLORIDE 0.9 % IV SOLN: 250.0000 mL | INTRAVENOUS | Status: DC

## 2014-06-15 MED ORDER — LACTATED RINGERS IV SOLN
INTRAVENOUS | Status: DC
  Administered 2014-06-15: 20 mL/h via INTRAVENOUS

## 2014-06-15 MED ORDER — INSULIN REGULAR HUMAN 100 UNIT/ML IJ SOLN
INTRAMUSCULAR | Status: DC
  Administered 2014-06-15: 3.7 [IU]/h via INTRAVENOUS
  Administered 2014-06-15: via INTRAVENOUS
  Filled 2014-06-15 (×2): qty 2.5

## 2014-06-15 MED ORDER — ALBUMIN HUMAN 5 % IV SOLN
INTRAVENOUS | Status: DC | PRN
  Administered 2014-06-15: 13:00:00 via INTRAVENOUS

## 2014-06-15 MED ORDER — ONDANSETRON HCL 4 MG/2ML IJ SOLN
4.0000 mg | Freq: Four times a day (QID) | INTRAMUSCULAR | Status: DC | PRN
  Administered 2014-06-15: 4 mg via INTRAVENOUS
  Filled 2014-06-15: qty 2

## 2014-06-15 MED ORDER — ARTIFICIAL TEARS OP OINT
TOPICAL_OINTMENT | OPHTHALMIC | Status: AC
  Filled 2014-06-15: qty 3.5

## 2014-06-15 MED ORDER — CHLORHEXIDINE GLUCONATE 0.12 % MT SOLN
15.0000 mL | Freq: Two times a day (BID) | OROMUCOSAL | Status: DC
  Administered 2014-06-15: 15 mL via OROMUCOSAL
  Filled 2014-06-15: qty 15

## 2014-06-15 MED ORDER — BISACODYL 10 MG RE SUPP: 10.0000 mg | Freq: Every day | RECTAL | Status: DC

## 2014-06-15 MED ORDER — PROPOFOL 10 MG/ML IV BOLUS
INTRAVENOUS | Status: AC
  Filled 2014-06-15: qty 20

## 2014-06-15 MED ORDER — SODIUM CHLORIDE 0.45 % IV SOLN
INTRAVENOUS | Status: DC
  Administered 2014-06-15: 20 mL/h via INTRAVENOUS

## 2014-06-15 MED ORDER — SODIUM CHLORIDE 0.9 % IJ SOLN: 3.0000 mL | INTRAMUSCULAR | Status: DC | PRN

## 2014-06-15 MED ORDER — ACETAMINOPHEN 160 MG/5ML PO SOLN
650.0000 mg | Freq: Once | ORAL | Status: AC
Start: 2014-06-15 — End: 2014-06-15

## 2014-06-15 MED ORDER — ROCURONIUM BROMIDE 100 MG/10ML IV SOLN
INTRAVENOUS | Status: DC | PRN
  Administered 2014-06-15: 50 mg via INTRAVENOUS
  Administered 2014-06-15: 80 mg via INTRAVENOUS
  Administered 2014-06-15: 20 mg via INTRAVENOUS
  Administered 2014-06-15 (×3): 50 mg via INTRAVENOUS

## 2014-06-15 MED ORDER — PANTOPRAZOLE SODIUM 40 MG PO TBEC
40.0000 mg | DELAYED_RELEASE_TABLET | Freq: Every day | ORAL | Status: DC
  Administered 2014-06-17 – 2014-06-22 (×6): 40 mg via ORAL
  Filled 2014-06-15 (×6): qty 1

## 2014-06-15 MED ORDER — TRAMADOL HCL 50 MG PO TABS
50.0000 mg | ORAL_TABLET | ORAL | Status: DC | PRN
  Administered 2014-06-16 – 2014-06-22 (×30): 100 mg via ORAL
  Filled 2014-06-15 (×30): qty 2

## 2014-06-15 MED ORDER — INSULIN REGULAR BOLUS VIA INFUSION
0.0000 [IU] | Freq: Three times a day (TID) | INTRAVENOUS | Status: DC
  Filled 2014-06-15: qty 10

## 2014-06-15 MED ORDER — DOPAMINE-DEXTROSE 3.2-5 MG/ML-% IV SOLN: 0.0000 ug/kg/min | INTRAVENOUS | Status: DC

## 2014-06-15 MED ORDER — DEXMEDETOMIDINE HCL IN NACL 200 MCG/50ML IV SOLN
0.0000 ug/kg/h | INTRAVENOUS | Status: DC
  Administered 2014-06-15: 0.7 ug/kg/h via INTRAVENOUS

## 2014-06-15 MED ORDER — CETYLPYRIDINIUM CHLORIDE 0.05 % MT LIQD
7.0000 mL | Freq: Four times a day (QID) | OROMUCOSAL | Status: DC
  Administered 2014-06-15: 7 mL via OROMUCOSAL

## 2014-06-15 MED ORDER — MIDAZOLAM HCL 5 MG/5ML IJ SOLN
INTRAMUSCULAR | Status: DC | PRN
  Administered 2014-06-15 (×2): 2 mg via INTRAVENOUS
  Administered 2014-06-15: 3 mg via INTRAVENOUS

## 2014-06-15 MED ORDER — PROTAMINE SULFATE 10 MG/ML IV SOLN
INTRAVENOUS | Status: DC | PRN
  Administered 2014-06-15 (×3): 10 mg via INTRAVENOUS

## 2014-06-15 MED ORDER — FENTANYL CITRATE 0.05 MG/ML IJ SOLN
50.0000 ug | INTRAMUSCULAR | Status: DC | PRN
  Administered 2014-06-15 – 2014-06-16 (×6): 100 ug via INTRAVENOUS
  Filled 2014-06-15 (×7): qty 2

## 2014-06-15 MED ORDER — MIDAZOLAM HCL 10 MG/2ML IJ SOLN
INTRAMUSCULAR | Status: AC
  Filled 2014-06-15: qty 2

## 2014-06-15 SURGICAL SUPPLY — 96 items
ADAPTER CARDIO PERF ANTE/RETRO (ADAPTER) ×4 IMPLANT
ADPR PRFSN 84XANTGRD RTRGD (ADAPTER) ×2
ATTRACTOMAT 16X20 MAGNETIC DRP (DRAPES) ×4 IMPLANT
BAG DECANTER FOR FLEXI CONT (MISCELLANEOUS) ×4 IMPLANT
BANDAGE ELASTIC 4 VELCRO ST LF (GAUZE/BANDAGES/DRESSINGS) ×4 IMPLANT
BANDAGE ELASTIC 6 VELCRO ST LF (GAUZE/BANDAGES/DRESSINGS) ×4 IMPLANT
BASKET HEART  (ORDER IN 25'S) (MISCELLANEOUS) ×1
BASKET HEART (ORDER IN 25'S) (MISCELLANEOUS) ×1
BASKET HEART (ORDER IN 25S) (MISCELLANEOUS) ×2 IMPLANT
BLADE STERNUM SYSTEM 6 (BLADE) ×4 IMPLANT
BLADE SURG 12 STRL SS (BLADE) ×4 IMPLANT
BLADE SURG ROTATE 9660 (MISCELLANEOUS) IMPLANT
BNDG GAUZE ELAST 4 BULKY (GAUZE/BANDAGES/DRESSINGS) ×4 IMPLANT
CANISTER SUCTION 2500CC (MISCELLANEOUS) ×4 IMPLANT
CANNULA GUNDRY RCSP 15FR (MISCELLANEOUS) ×4 IMPLANT
CATH CPB KIT VANTRIGT (MISCELLANEOUS) ×4 IMPLANT
CATH ROBINSON RED A/P 18FR (CATHETERS) ×12 IMPLANT
CATH THORACIC 36FR RT ANG (CATHETERS) ×4 IMPLANT
CLIP FOGARTY SPRING 6M (CLIP) ×2 IMPLANT
CLIP RETRACTION 3.0MM CORONARY (MISCELLANEOUS) ×2 IMPLANT
CLIP TI WIDE RED SMALL 24 (CLIP) ×10 IMPLANT
COVER SURGICAL LIGHT HANDLE (MISCELLANEOUS) ×4 IMPLANT
CRADLE DONUT ADULT HEAD (MISCELLANEOUS) ×4 IMPLANT
DRAIN CHANNEL 32F RND 10.7 FF (WOUND CARE) ×4 IMPLANT
DRAPE CARDIOVASCULAR INCISE (DRAPES) ×4
DRAPE SLUSH/WARMER DISC (DRAPES) ×4 IMPLANT
DRAPE SRG 135X102X78XABS (DRAPES) ×2 IMPLANT
DRSG AQUACEL AG ADV 3.5X14 (GAUZE/BANDAGES/DRESSINGS) ×4 IMPLANT
ELECT BLADE 4.0 EZ CLEAN MEGAD (MISCELLANEOUS) ×4
ELECT BLADE 6.5 EXT (BLADE) ×4 IMPLANT
ELECT CAUTERY BLADE 6.4 (BLADE) ×4 IMPLANT
ELECT REM PT RETURN 9FT ADLT (ELECTROSURGICAL) ×8
ELECTRODE BLDE 4.0 EZ CLN MEGD (MISCELLANEOUS) ×2 IMPLANT
ELECTRODE REM PT RTRN 9FT ADLT (ELECTROSURGICAL) ×4 IMPLANT
GAUZE SPONGE 4X4 12PLY STRL (GAUZE/BANDAGES/DRESSINGS) ×8 IMPLANT
GLOVE BIO SURGEON STRL SZ7.5 (GLOVE) ×12 IMPLANT
GOWN STRL REUS W/ TWL LRG LVL3 (GOWN DISPOSABLE) ×8 IMPLANT
GOWN STRL REUS W/TWL LRG LVL3 (GOWN DISPOSABLE) ×16
HEMOSTAT POWDER SURGIFOAM 1G (HEMOSTASIS) ×12 IMPLANT
HEMOSTAT SURGICEL 2X14 (HEMOSTASIS) ×4 IMPLANT
INSERT FOGARTY XLG (MISCELLANEOUS) IMPLANT
KIT BASIN OR (CUSTOM PROCEDURE TRAY) ×4 IMPLANT
KIT ROOM TURNOVER OR (KITS) ×4 IMPLANT
KIT SUCTION CATH 14FR (SUCTIONS) ×4 IMPLANT
KIT VASOVIEW W/TROCAR VH 2000 (KITS) ×4 IMPLANT
LEAD PACING MYOCARDI (MISCELLANEOUS) ×4 IMPLANT
LINE VENT (MISCELLANEOUS) ×2 IMPLANT
LIQUID BAND (GAUZE/BANDAGES/DRESSINGS) ×2 IMPLANT
MARKER GRAFT CORONARY BYPASS (MISCELLANEOUS) ×12 IMPLANT
NS IRRIG 1000ML POUR BTL (IV SOLUTION) ×20 IMPLANT
PACK OPEN HEART (CUSTOM PROCEDURE TRAY) ×4 IMPLANT
PAD ARMBOARD 7.5X6 YLW CONV (MISCELLANEOUS) ×8 IMPLANT
PAD ELECT DEFIB RADIOL ZOLL (MISCELLANEOUS) ×4 IMPLANT
PENCIL BUTTON HOLSTER BLD 10FT (ELECTRODE) ×4 IMPLANT
PUNCH AORTIC ROT 4.0MM RCL 40 (MISCELLANEOUS) ×2 IMPLANT
PUNCH AORTIC ROTATE 4.0MM (MISCELLANEOUS) IMPLANT
PUNCH AORTIC ROTATE 4.5MM 8IN (MISCELLANEOUS) IMPLANT
PUNCH AORTIC ROTATE 5MM 8IN (MISCELLANEOUS) IMPLANT
SET CARDIOPLEGIA MPS 5001102 (MISCELLANEOUS) ×2 IMPLANT
SPONGE GAUZE 4X4 12PLY STER LF (GAUZE/BANDAGES/DRESSINGS) ×4 IMPLANT
SURGIFLO W/THROMBIN 8M KIT (HEMOSTASIS) ×4 IMPLANT
SUT BONE WAX W31G (SUTURE) ×4 IMPLANT
SUT ETHIBOND 2 0 SH (SUTURE) ×4
SUT ETHIBOND 2 0 SH 36X2 (SUTURE) IMPLANT
SUT MNCRL AB 4-0 PS2 18 (SUTURE) IMPLANT
SUT PROLENE 3 0 SH DA (SUTURE) ×4 IMPLANT
SUT PROLENE 3 0 SH1 36 (SUTURE) IMPLANT
SUT PROLENE 4 0 RB 1 (SUTURE) ×4
SUT PROLENE 4 0 SH DA (SUTURE) ×4 IMPLANT
SUT PROLENE 4-0 RB1 .5 CRCL 36 (SUTURE) ×2 IMPLANT
SUT PROLENE 5 0 C 1 36 (SUTURE) IMPLANT
SUT PROLENE 6 0 C 1 30 (SUTURE) ×6 IMPLANT
SUT PROLENE 6 0 CC (SUTURE) ×12 IMPLANT
SUT PROLENE 8 0 BV175 6 (SUTURE) ×2 IMPLANT
SUT PROLENE BLUE 7 0 (SUTURE) ×6 IMPLANT
SUT SILK  1 MH (SUTURE)
SUT SILK 1 MH (SUTURE) IMPLANT
SUT SILK 2 0 SH CR/8 (SUTURE) ×2 IMPLANT
SUT SILK 3 0 SH CR/8 (SUTURE) ×2 IMPLANT
SUT STEEL 6MS V (SUTURE) ×8 IMPLANT
SUT STEEL SZ 6 DBL 3X14 BALL (SUTURE) ×4 IMPLANT
SUT VIC AB 1 CTX 36 (SUTURE) ×8
SUT VIC AB 1 CTX36XBRD ANBCTR (SUTURE) ×4 IMPLANT
SUT VIC AB 2-0 CT1 27 (SUTURE) ×4
SUT VIC AB 2-0 CT1 TAPERPNT 27 (SUTURE) IMPLANT
SUT VIC AB 2-0 CTX 27 (SUTURE) IMPLANT
SUT VIC AB 3-0 X1 27 (SUTURE) ×2 IMPLANT
SUTURE E-PAK OPEN HEART (SUTURE) ×4 IMPLANT
SYSTEM SAHARA CHEST DRAIN ATS (WOUND CARE) ×4 IMPLANT
TAPE CLOTH SURG 4X10 WHT LF (GAUZE/BANDAGES/DRESSINGS) ×4 IMPLANT
TOWEL OR 17X24 6PK STRL BLUE (TOWEL DISPOSABLE) ×8 IMPLANT
TOWEL OR 17X26 10 PK STRL BLUE (TOWEL DISPOSABLE) ×8 IMPLANT
TRAY FOLEY IC TEMP SENS 16FR (CATHETERS) ×4 IMPLANT
TUBING INSUFFLATION (TUBING) ×4 IMPLANT
UNDERPAD 30X30 INCONTINENT (UNDERPADS AND DIAPERS) ×4 IMPLANT
WATER STERILE IRR 1000ML POUR (IV SOLUTION) ×8 IMPLANT

## 2014-06-15 NOTE — Brief Op Note (Signed)
      [address]       [place] [address]             [phone]     06/10/2014 - 06/15/2014  11:33 AM  PATIENT:  Joyce [name]  73 y.o. female  PRE-OPERATIVE DIAGNOSIS:  CAD  POST-OPERATIVE DIAGNOSIS:  CAD  PROCEDURE:  Procedure(s): CORONARY ARTERY BYPASS GRAFTING (CABG)X4 LIMA-LAD; SVG-OM;SVG-PD; SVG-DIAG INTRAOPERATIVE TRANSESOPHAGEAL ECHOCARDIOGRAM [facility] RIGHT LEG  SURGEON:  Surgeon(s): Ivin Poot, MD  PHYSICIAN ASSISTANT: WAYNE GOLD PA-C  ANESTHESIA:   general  PATIENT CONDITION:  ICU - intubated and hemodynamically stable.  PRE-OPERATIVE WEIGHT: 87kg  EBL: SEE ANEST/PERFUSION RECORDS  COMPLICATIONS: NO KNOWN

## 2014-06-15 NOTE — Anesthesia Preprocedure Evaluation (Signed)
Anesthesia Evaluation    Reviewed: Allergy & Precautions, NPO status , Patient's Chart, lab work & pertinent test results  History of Anesthesia Complications Negative for: history of anesthetic complications  Airway        Dental   Pulmonary neg pulmonary ROS,          Cardiovascular hypertension, + angina + Past MI  Cath Final Conclusions:  1. Severe three-vessel coronary artery disease as outlined above with heavily calcified and diffusely diseased coronary vessels. 2. Normal LV systolic function.   Neuro/Psych negative neurological ROS  negative psych ROS   GI/Hepatic negative GI ROS, Neg liver ROS,   Endo/Other  diabetes  Renal/GU Renal InsufficiencyRenal disease     Musculoskeletal   Abdominal   Peds  Hematology   Anesthesia Other Findings   Reproductive/Obstetrics                             Anesthesia Physical Anesthesia Plan  ASA: IV  Anesthesia Plan: General   Post-op Pain Management:    Induction: Intravenous  Airway Management Planned: Oral ETT  Additional Equipment: Arterial line, 3D TEE and PA Cath  Intra-op Plan:   Post-operative Plan: Post-operative intubation/ventilation  Informed Consent:   Plan Discussed with:   Anesthesia Plan Comments:         Anesthesia Quick Evaluation

## 2014-06-15 NOTE — Progress Notes (Signed)
  Echocardiogram Echocardiogram Transesophageal has been performed.  Joyce [name] 06/15/2014, 9:48 AM

## 2014-06-15 NOTE — Anesthesia Postprocedure Evaluation (Signed)
Anesthesia Post Note  Patient: Joyce [name]  Procedure(s) Performed: Procedure(s) (LRB): CORONARY ARTERY BYPASS GRAFTING (CABG) (N/A) INTRAOPERATIVE TRANSESOPHAGEAL ECHOCARDIOGRAM (N/A)  Anesthesia type: General  Patient location: ICU  Post pain: Pain level controlled  Post assessment: Post-op Vital signs reviewed  Last Vitals:  Filed Vitals:   06/15/14 1338  BP: 150/79  Pulse: 91  Temp:   Resp: 31    Post vital signs: stable  Level of consciousness: Patient remains intubated per anesthesia plan  Complications: No apparent anesthesia complications

## 2014-06-15 NOTE — Progress Notes (Signed)
The patient was examined and preop studies reviewed. There has been no change from the prior exam and the patient is ready for surgery.   Plan CABG on B [place] today

## 2014-06-15 NOTE — OR Nursing (Signed)
45 min call made to SICU

## 2014-06-15 NOTE — OR Nursing (Signed)
second call made to SICU @1302 

## 2014-06-15 NOTE — OR Nursing (Signed)
Two rings given to Jene Every, RN. Wilson forwarded the rings to [place] from [employer] to give to security.

## 2014-06-15 NOTE — Progress Notes (Addendum)
CT surgery p.m. Rounds  Status post CABG 4-unstable angina with subendocardial MI Extubated with stable hemodynamics Urine output 30 cc per hour-baseline creatinine elevated 1.6 We'll allow systolic blood pressure to be higher for renal perfusion

## 2014-06-15 NOTE — Procedures (Signed)
Extubation Procedure Note  Patient Details:   Name: Joyce [name] DOB: 1940/08/19 MRN: [id number]   Airway Documentation:     Evaluation  O2 sats: stable throughout Complications: No apparent complications Patient did tolerate procedure well. Bilateral Breath Sounds: Clear Suctioning: Oral, Airway Yes   Pt extubated per Heart Rapid Wean Protocol. Pt extubated to 3L [place]. Pt NIF -30, VC 600, ABG within normal limits. No complications, pt stable throughout. Pt able to speak name and cough post extubation. RT will continue to monitor.   Jesse Sans 06/15/2014, 6:55 PM

## 2014-06-15 NOTE — Transfer of Care (Signed)
Immediate Anesthesia Transfer of Care Note  Patient: Joyce [name]  Procedure(s) Performed: Procedure(s): CORONARY ARTERY BYPASS GRAFTING (CABG) (N/A) INTRAOPERATIVE TRANSESOPHAGEAL ECHOCARDIOGRAM (N/A)  Patient Location: SICU  Anesthesia Type:General  Level of Consciousness: Patient remains intubated per anesthesia plan  Airway & Oxygen Therapy: Patient remains intubated per anesthesia plan and Patient placed on Ventilator (see vital sign flow sheet for setting)  Post-op Assessment: Report given to PACU RN  Post vital signs: Reviewed and stable  Complications: No apparent anesthesia complications

## 2014-06-15 NOTE — Anesthesia Procedure Notes (Signed)
Procedure Name: Intubation Date/Time: 06/15/2014 7:44 AM Performed by: Izora Gala Pre-anesthesia Checklist: Patient identified, Emergency Drugs available, Suction available, Patient being monitored and Timeout performed Patient Re-evaluated:Patient Re-evaluated prior to inductionOxygen Delivery Method: Circle system utilized Preoxygenation: Pre-oxygenation with 100% oxygen Intubation Type: IV induction Ventilation: Mask ventilation without difficulty and Oral airway inserted - appropriate to patient size Laryngoscope Size: Miller and 2 Grade View: Grade II Tube type: Oral Tube size: 7.5 mm Number of attempts: 1 Airway Equipment and Method: Stylet Placement Confirmation: ETT inserted through vocal cords under direct vision,  breath sounds checked- equal and bilateral and positive ETCO2 Secured at: 23 cm Tube secured with: Tape Dental Injury: Teeth and Oropharynx as per pre-operative assessment

## 2014-06-16 ENCOUNTER — Inpatient Hospital Stay (HOSPITAL_COMMUNITY): Payer: Medicare Other

## 2014-06-16 ENCOUNTER — Encounter (HOSPITAL_COMMUNITY): Payer: Self-pay | Admitting: Cardiothoracic Surgery

## 2014-06-16 DIAGNOSIS — E119 Type 2 diabetes mellitus without complications: Secondary | ICD-10-CM

## 2014-06-16 LAB — GLUCOSE, CAPILLARY
GLUCOSE-CAPILLARY: 117 mg/dL — AB (ref 70–99)
GLUCOSE-CAPILLARY: 100 mg/dL — AB (ref 70–99)
GLUCOSE-CAPILLARY: 110 mg/dL — AB (ref 70–99)
GLUCOSE-CAPILLARY: 183 mg/dL — AB (ref 70–99)
GLUCOSE-CAPILLARY: 117 mg/dL — AB (ref 70–99)
GLUCOSE-CAPILLARY: 152 mg/dL — AB (ref 70–99)
Glucose-Capillary: 100 mg/dL — ABNORMAL HIGH (ref 70–99)
Glucose-Capillary: 101 mg/dL — ABNORMAL HIGH (ref 70–99)
Glucose-Capillary: 108 mg/dL — ABNORMAL HIGH (ref 70–99)
Glucose-Capillary: 108 mg/dL — ABNORMAL HIGH (ref 70–99)
Glucose-Capillary: 129 mg/dL — ABNORMAL HIGH (ref 70–99)
Glucose-Capillary: 141 mg/dL — ABNORMAL HIGH (ref 70–99)
Glucose-Capillary: 252 mg/dL — ABNORMAL HIGH (ref 70–99)
Glucose-Capillary: 98 mg/dL (ref 70–99)
Glucose-Capillary: 99 mg/dL (ref 70–99)
Glucose-Capillary: 99 mg/dL (ref 70–99)

## 2014-06-16 LAB — PREPARE FRESH FROZEN PLASMA: Unit division: 0

## 2014-06-16 LAB — CBC
HCT: 30.6 % — ABNORMAL LOW (ref 36.0–46.0)
HCT: 33 % — ABNORMAL LOW (ref 36.0–46.0)
Hemoglobin: 10.2 g/dL — ABNORMAL LOW (ref 12.0–15.0)
Hemoglobin: 10.9 g/dL — ABNORMAL LOW (ref 12.0–15.0)
MCH: 28.6 pg (ref 26.0–34.0)
MCH: 28.8 pg (ref 26.0–34.0)
MCHC: 33.3 g/dL (ref 30.0–36.0)
MCHC: 33 g/dL (ref 30.0–36.0)
MCV: 85.7 fL (ref 78.0–100.0)
MCV: 87.1 fL (ref 78.0–100.0)
PLATELETS: 150 10*3/uL (ref 150–400)
Platelets: 105 10*3/uL — ABNORMAL LOW (ref 150–400)
RBC: 3.57 MIL/uL — ABNORMAL LOW (ref 3.87–5.11)
RBC: 3.79 MIL/uL — AB (ref 3.87–5.11)
RDW: 14 % (ref 11.5–15.5)
RDW: 14.5 % (ref 11.5–15.5)
WBC: 5.1 10*3/uL (ref 4.0–10.5)
WBC: 9.7 10*3/uL (ref 4.0–10.5)

## 2014-06-16 LAB — BASIC METABOLIC PANEL
Anion gap: 5 (ref 5–15)
BUN: 18 mg/dL (ref 6–23)
CALCIUM: 8.2 mg/dL — AB (ref 8.4–10.5)
CO2: 23 mmol/L (ref 19–32)
CREATININE: 1.51 mg/dL — AB (ref 0.50–1.10)
Chloride: 111 mEq/L (ref 96–112)
GFR calc non Af Amer: 34 mL/min — ABNORMAL LOW (ref >90)
GFR, EST AFRICAN AMERICAN: 39 mL/min — AB (ref >90)
Glucose, Bld: 103 mg/dL — ABNORMAL HIGH (ref 70–99)
Potassium: 4.2 mmol/L (ref 3.5–5.1)
Sodium: 139 mmol/L (ref 135–145)

## 2014-06-16 LAB — PREPARE PLATELET PHERESIS: Unit division: 0

## 2014-06-16 LAB — POCT I-STAT, CHEM 8
BUN: 22 mg/dL (ref 6–23)
CHLORIDE: 103 meq/L (ref 96–112)
Calcium, Ion: 1.24 mmol/L (ref 1.13–1.30)
Creatinine, Ser: 1.9 mg/dL — ABNORMAL HIGH (ref 0.50–1.10)
GLUCOSE: 104 mg/dL — AB (ref 70–99)
HEMATOCRIT: 32 % — AB (ref 36.0–46.0)
HEMOGLOBIN: 10.9 g/dL — AB (ref 12.0–15.0)
POTASSIUM: 4.1 mmol/L (ref 3.5–5.1)
Sodium: 137 mmol/L (ref 135–145)
TCO2: 18 mmol/L (ref 0–100)

## 2014-06-16 LAB — MAGNESIUM
MAGNESIUM: 2.2 mg/dL (ref 1.5–2.5)
Magnesium: 2.5 mg/dL (ref 1.5–2.5)

## 2014-06-16 LAB — CREATININE, SERUM
CREATININE: 1.9 mg/dL — AB (ref 0.50–1.10)
GFR, EST AFRICAN AMERICAN: 30 mL/min — AB (ref >90)
GFR, EST NON AFRICAN AMERICAN: 25 mL/min — AB (ref >90)

## 2014-06-16 MED ORDER — FENTANYL CITRATE 0.05 MG/ML IJ SOLN
25.0000 ug | INTRAMUSCULAR | Status: DC | PRN
  Administered 2014-06-16 (×3): 25 ug via INTRAVENOUS
  Filled 2014-06-16 (×6): qty 2

## 2014-06-16 MED ORDER — METOCLOPRAMIDE HCL 5 MG/ML IJ SOLN
10.0000 mg | Freq: Four times a day (QID) | INTRAMUSCULAR | Status: AC
  Administered 2014-06-16 – 2014-06-18 (×10): 10 mg via INTRAVENOUS
  Filled 2014-06-16 (×10): qty 2

## 2014-06-16 MED ORDER — FUROSEMIDE 10 MG/ML IJ SOLN
40.0000 mg | Freq: Two times a day (BID) | INTRAMUSCULAR | Status: DC
  Administered 2014-06-16 (×2): 40 mg via INTRAVENOUS
  Filled 2014-06-16 (×5): qty 4

## 2014-06-16 MED ORDER — VANCOMYCIN HCL IN DEXTROSE 1-5 GM/200ML-% IV SOLN
1000.0000 mg | Freq: Once | INTRAVENOUS | Status: AC
  Administered 2014-06-16: 1000 mg via INTRAVENOUS
  Filled 2014-06-16: qty 200

## 2014-06-16 MED ORDER — INSULIN ASPART 100 UNIT/ML ~~LOC~~ SOLN
0.0000 [IU] | SUBCUTANEOUS | Status: DC
  Administered 2014-06-16 – 2014-06-17 (×4): 2 [IU] via SUBCUTANEOUS

## 2014-06-16 MED ORDER — INSULIN ASPART 100 UNIT/ML ~~LOC~~ SOLN
4.0000 [IU] | Freq: Three times a day (TID) | SUBCUTANEOUS | Status: DC
  Administered 2014-06-16 – 2014-06-22 (×15): 4 [IU] via SUBCUTANEOUS

## 2014-06-16 MED ORDER — HYDRALAZINE HCL 20 MG/ML IJ SOLN
10.0000 mg | INTRAMUSCULAR | Status: DC | PRN
  Administered 2014-06-16: 10 mg via INTRAVENOUS
  Filled 2014-06-16: qty 1

## 2014-06-16 MED ORDER — INSULIN DETEMIR 100 UNIT/ML ~~LOC~~ SOLN
15.0000 [IU] | Freq: Two times a day (BID) | SUBCUTANEOUS | Status: DC
  Administered 2014-06-16 – 2014-06-19 (×8): 15 [IU] via SUBCUTANEOUS
  Filled 2014-06-16 (×12): qty 0.15

## 2014-06-16 MED ORDER — AMLODIPINE BESYLATE 5 MG PO TABS
5.0000 mg | ORAL_TABLET | Freq: Every day | ORAL | Status: DC
  Administered 2014-06-16 – 2014-06-18 (×3): 5 mg via ORAL
  Filled 2014-06-16 (×4): qty 1

## 2014-06-16 MED FILL — Sodium Chloride IV Soln 0.9%: INTRAVENOUS | Qty: 1000 | Status: AC

## 2014-06-16 MED FILL — Heparin Sodium (Porcine) Inj 1000 Unit/ML: INTRAMUSCULAR | Qty: 30 | Status: AC

## 2014-06-16 MED FILL — Magnesium Sulfate Inj 50%: INTRAMUSCULAR | Qty: 10 | Status: AC

## 2014-06-16 MED FILL — Lidocaine HCl IV Inj 20 MG/ML: INTRAVENOUS | Qty: 10 | Status: AC

## 2014-06-16 MED FILL — Heparin Sodium (Porcine) Inj 1000 Unit/ML: INTRAMUSCULAR | Qty: 10 | Status: AC

## 2014-06-16 MED FILL — Potassium Chloride Inj 2 mEq/ML: INTRAVENOUS | Qty: 40 | Status: AC

## 2014-06-16 MED FILL — Mannitol IV Soln 20%: INTRAVENOUS | Qty: 500 | Status: AC

## 2014-06-16 MED FILL — Electrolyte-R (PH 7.4) Solution: INTRAVENOUS | Qty: 3000 | Status: AC

## 2014-06-16 MED FILL — Sodium Bicarbonate IV Soln 8.4%: INTRAVENOUS | Qty: 50 | Status: AC

## 2014-06-16 NOTE — Op Note (Signed)
Joyce [name], GOW NO.:  [id number]  MEDICAL RECORD NO.:  [id number]  LOCATION:  2S05C                        FACILITY:  [facility]  PHYSICIAN:  Ivin Poot, M.D.  DATE OF BIRTH:  May 19, 1941  DATE OF PROCEDURE:  06/15/2014 DATE OF DISCHARGE:                              OPERATIVE REPORT   OPERATION: 1. Coronary artery bypass grafting x4 (left internal mammary artery to     LAD, saphenous vein graft to diagonal, saphenous vein graft to     ramus intermediate, saphenous vein graft to posterior descending). 2. Endoscopic harvest of right leg greater saphenous vein.  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  SURGEON:  Ivin Poot, M.D.  ASSISTANT:  Jadene Pierini, PA-C  ANESTHESIA:  General by Nelda Severe. Tobias Alexander, M.D.  INDICATIONS:  The patient is a 73 year old obese diabetic nonsmoker, who presents with unstable angina.  Cardiac enzymes were positive and mildly elevated.  EKG showed no evidence of ST elevation MI.  She underwent cardiac catheterization which demonstrated severe 3-vessel coronary artery disease with preserved LV function.  Echocardiogram showed no significant valvular pathology.  She was felt to be a candidate for surgical coronary revascularization.  I examined the patient in her cardiac telemetry room and reviewed results of the cardiac catheterization with the patient and family.  I discussed the indications and expected benefits of coronary artery bypass surgery for treatment of her coronary artery disease.  I discussed the major aspects of the surgery including the use of general anesthesia and cardiopulmonary bypass, the location of the surgical incisions, and the expected postoperative hospital recovery.  I discussed  with the patient the risks to her of the operation including the risks of MI, stroke, bleeding, blood transfusion requirement, infection, postoperative pleural effusion or lung problems, and death.  After reviewing these issues, she demonstrated her understanding and agreed to proceed with the surgery under what I felt was an informed consent.  OPERATIVE FINDINGS: 1. The patient presented with severe anemia with a hematocrit of 21     and received 2 units of packed cells as a prime for the     cardiopulmonary bypass circuit. 2. Adequate conduit, although the vein graft to the diagonal was     small. 3. Adequate targets, although the diagonal vessel was small and the     LAD was diffusely diseased, but the anastomosis was placed where a     1 mm probe passed easily to the apex.  DESCRIPTION OF PROCEDURE:  The patient was brought to the operating room, placed supine on the operating table where general anesthesia was induced under invasive hemodynamic monitoring.  A transesophageal echo probe was placed by the anesthesiologist.  The patient was prepped and draped as a sterile field.  A proper time-out was performed.  A sternal incision was made as the saphenous vein was harvested endoscopically from the right leg.  The left internal mammary artery was harvested as a pedicle graft from its origin at the subclavian vessels.  There was a 1.5-mm  vessel with excellent flow.  The sternal retractor was placed. The pericardium was opened and suspended.  Pursestrings were placed in the ascending aorta and right atrium and heparin was administered after the vein was harvested.  The ACT was documented as being therapeutic. The patient was then cannulated and placed on cardiopulmonary bypass.  The coronaries were identified for grafting and the mammary artery and vein grafts were prepared for the distal anastomoses.  Cardioplegic cannulas were placed for both antegrade and retrograde cold  blood cardioplegia.  The patient was cooled to 32 degrees.  The aortic crossclamp was applied.  One liter of cold blood cardioplegia was delivered in the split doses between the antegrade aortic and retrograde coronary sinus catheters.  There was good cardioplegic arrest and septal temperature dropped less than 14 degrees.  Cardioplegia was delivered every 20 minutes or less.  The distal coronary anastomoses were then performed.  First distal anastomosis was to the posterior descending.  This was a 1.5-mm vessel proximal 95% stenosis.  A reverse saphenous vein was sewn end-to-side with running 7-0 Prolene with good flow through the graft.  The second distal anastomosis was to the ramus branch of the left coronary.  The vessel was a 1.5-mm vessel to proximal 90% stenosis and a reverse saphenous vein was sewn end-to-side with running 7-0 Prolene with good flow through the graft.  Cardioplegia was redosed.  The third distal anastomosis was to the first diagonal branch to the LAD.  This was a 1.2-mm vessel proximal 80% stenosis.  A reverse saphenous vein was sewn end-to-side with running 7-0 Prolene.  There was good flow through the graft.  Cardioplegia was redosed.  The fourth distal anastomosis was to the distal portion of the LAD.  It had a proximal 95% stenosis and distal disease as well.  The left IMA pedicle was brought through an opening in the left lateral pericardium, was brought down onto the LAD and sewn end-to-side with running 8-0 Prolene.  There was good flow through the anastomosis after briefly releasing the bulldog on the mammary pedicle.  The bulldog was reapplied and the pedicle was secured to the epicardium.  Cardioplegia was redosed.  While the crossclamp was in place, 3 proximal vein anastomoses were performed on the ascending aorta using a 4.0 mm punch with running 6-0 Prolene.  Air was vented from the coronaries with a dose of retrograde warm blood cardioplegia  and then the crossclamp was removed.  The heart was cardioverted back to a regular rhythm.  The vein grafts were de-aired and opened and each had good flow.  Hemostasis was documented at the proximal and distal sites.  The patient was rewarmed and reperfused.  Temporary pacing wires were applied.  The lungs re- expanded and the ventilator was resumed.  The patient was weaned from cardiopulmonary bypass, not needing inotropes.  Hemodynamics were stable.  Echo showed good preserved global LV function.  Protamine was administered without adverse reaction.  The cannulas were removed.  The mediastinum was irrigated with warm saline.  The superior pericardial fat was closed over the aorta.  Anterior mediastinal and left pleural chest tube were placed and brought out through separate incisions.  The sternum was closed with interrupted wire.  The pectoralis fascia was closed in running #1 Vicryl.  The subcutaneous and skin layers were closed in running Vicryl.  Total cardiopulmonary bypass time was 120 minutes.     Ivin Poot, M.D.     PV/MEDQ  D:  06/15/2014  T:  06/16/2014  Job:  [id number]  cc:   Juanda Bond. Burt Knack, MD

## 2014-06-16 NOTE — Progress Notes (Signed)
1 Day Post-Op Procedure(s) (LRB): CORONARY ARTERY BYPASS GRAFTING (CABG) (N/A) INTRAOPERATIVE TRANSESOPHAGEAL ECHOCARDIOGRAM (N/A) Subjective: S/P CABG x4 DM, HTN, CRI Allergic to beta blockers nsr  Objective: Vital signs in last 24 hours: Temp:  [98.2 F (36.8 C)-99.7 F (37.6 C)] 99.3 F (37.4 C) (12/22 0700) Pulse Rate:  [66-91] 66 (12/22 0700) Cardiac Rhythm:  [-] Normal sinus rhythm (12/22 0700) Resp:  [11-31] 20 (12/22 0700) BP: (84-150)/(54-79) 117/56 mmHg (12/22 0700) SpO2:  [96 %-100 %] 99 % (12/22 0700) Arterial Line BP: (98-185)/(53-89) 125/53 mmHg (12/22 0700) FiO2 (%):  [40 %-50 %] 40 % (12/21 1745) Weight:  [203 lb 0.7 oz (92.1 kg)] 203 lb 0.7 oz (92.1 kg) (12/22 0500)  Hemodynamic parameters for last 24 hours: PAP: (18-37)/(10-25) 30/13 mmHg CO:  [3.2 L/min-5.5 L/min] 4.1 L/min CI:  [1.6 L/min/m2-2.9 L/min/m2] 2.1 L/min/m2  Intake/Output from previous day: 12/21 0701 - 12/22 0700 In: 4895.5 [I.V.:2590.5; Blood:875; NG/GT:30; IV Piggyback:1400] Out: 2975 [Urine:1355; Emesis/NG output:110; Blood:800; Chest Tube:710] Intake/Output this shift:    Neuro intact Mild edema  Lab Results:  Recent Labs  06/15/14 2005 06/15/14 2018 06/16/14 0450  WBC 6.6  --  5.1  HGB 10.9* 10.9* 10.2*  HCT 32.3* 32.0* 30.6*  PLT 122*  --  105*   BMET:  Recent Labs  06/14/14 0352  06/15/14 2018 06/16/14 0450  NA 141  < > 143 139  K 4.4  < > 4.5 4.2  CL 107  < > 110 111  CO2 21  --   --  23  GLUCOSE 131*  < > 148* 103*  BUN 26*  < > 24* 18  CREATININE 1.56*  < > 1.40* 1.51*  CALCIUM 8.6  --   --  8.2*  < > = values in this interval not displayed.  PT/INR:  Recent Labs  06/15/14 1300  LABPROT 15.5*  INR 1.21   ABG    Component Value Date/Time   PHART 7.295* 06/15/2014 2013   HCO3 19.7* 06/15/2014 2013   TCO2 19 06/15/2014 2018   ACIDBASEDEF 6.0* 06/15/2014 2013   O2SAT 97.0 06/15/2014 2013   CBG (last 3)   Recent Labs  06/15/14 2220  06/15/14 2259 06/16/14 0006  GLUCAP 127* 117* 108*    Assessment/Plan: S/P Procedure(s) (LRB): CORONARY ARTERY BYPASS GRAFTING (CABG) (N/A) INTRAOPERATIVE TRANSESOPHAGEAL ECHOCARDIOGRAM (N/A) Careful diuresis for volume overload DC lines OOB to chair Lantus plus SSI for DM control   LOS: 6 days    [name],[name] 06/16/2014

## 2014-06-16 NOTE — Progress Notes (Signed)
Asleep currently  BP 145/67 mmHg  Pulse 84  Temp(Src) 98.5 F (36.9 C) (Oral)  Resp 16  Ht 5\' 5"  (1.651 m)  Wt 203 lb 0.7 oz (92.1 kg)  BMI 33.79 kg/m2  SpO2 98%   Intake/Output Summary (Last 24 hours) at 06/16/14 1905 Last data filed at 06/16/14 1400  Gross per 24 hour  Intake 1942.15 ml  Output   1805 ml  Net 137.15 ml   Good response to lasix initially but tapered off rapidly Creatinine up to 1.9 (1.6 baseline), lytes OK  Continue current care

## 2014-06-16 NOTE — Progress Notes (Signed)
UR Completed.  [phone]  

## 2014-06-17 ENCOUNTER — Inpatient Hospital Stay (HOSPITAL_COMMUNITY): Payer: Medicare Other

## 2014-06-17 LAB — TYPE AND SCREEN
ABO/RH(D): O POS
Antibody Screen: NEGATIVE
Unit division: 0
Unit division: 0
Unit division: 0
Unit division: 0
Unit division: 0
Unit division: 0

## 2014-06-17 LAB — GLUCOSE, CAPILLARY
Glucose-Capillary: 108 mg/dL — ABNORMAL HIGH (ref 70–99)
Glucose-Capillary: 122 mg/dL — ABNORMAL HIGH (ref 70–99)
Glucose-Capillary: 123 mg/dL — ABNORMAL HIGH (ref 70–99)
Glucose-Capillary: 131 mg/dL — ABNORMAL HIGH (ref 70–99)
Glucose-Capillary: 175 mg/dL — ABNORMAL HIGH (ref 70–99)
Glucose-Capillary: 205 mg/dL — ABNORMAL HIGH (ref 70–99)

## 2014-06-17 LAB — BASIC METABOLIC PANEL
Anion gap: 7 (ref 5–15)
BUN: 23 mg/dL (ref 6–23)
CO2: 24 mmol/L (ref 19–32)
Calcium: 8.3 mg/dL — ABNORMAL LOW (ref 8.4–10.5)
Chloride: 105 mEq/L (ref 96–112)
Creatinine, Ser: 1.91 mg/dL — ABNORMAL HIGH (ref 0.50–1.10)
GFR calc Af Amer: 29 mL/min — ABNORMAL LOW (ref >90)
GFR calc non Af Amer: 25 mL/min — ABNORMAL LOW (ref >90)
Glucose, Bld: 127 mg/dL — ABNORMAL HIGH (ref 70–99)
Potassium: 4 mmol/L (ref 3.5–5.1)
Sodium: 136 mmol/L (ref 135–145)

## 2014-06-17 LAB — CBC
HCT: 32.2 % — ABNORMAL LOW (ref 36.0–46.0)
Hemoglobin: 10.7 g/dL — ABNORMAL LOW (ref 12.0–15.0)
MCH: 29.9 pg (ref 26.0–34.0)
MCHC: 33.2 g/dL (ref 30.0–36.0)
MCV: 89.9 fL (ref 78.0–100.0)
Platelets: 121 10*3/uL — ABNORMAL LOW (ref 150–400)
RBC: 3.58 MIL/uL — ABNORMAL LOW (ref 3.87–5.11)
RDW: 14.4 % (ref 11.5–15.5)
WBC: 7.6 10*3/uL (ref 4.0–10.5)

## 2014-06-17 MED ORDER — SODIUM CHLORIDE 0.9 % IJ SOLN
3.0000 mL | INTRAMUSCULAR | Status: DC | PRN
Start: 2014-06-17 — End: 2014-06-22

## 2014-06-17 MED ORDER — PRIMIDONE 50 MG PO TABS
50.0000 mg | ORAL_TABLET | Freq: Every day | ORAL | Status: DC
  Administered 2014-06-17 – 2014-06-21 (×5): 50 mg via ORAL
  Filled 2014-06-17 (×6): qty 1

## 2014-06-17 MED ORDER — SODIUM CHLORIDE 0.9 % IV SOLN: 250.0000 mL | INTRAVENOUS | Status: DC | PRN

## 2014-06-17 MED ORDER — MOVING RIGHT ALONG BOOK
Freq: Once | Status: AC
  Administered 2014-06-17: 18:00:00
  Filled 2014-06-17: qty 1

## 2014-06-17 MED ORDER — SODIUM CHLORIDE 0.9 % IJ SOLN
3.0000 mL | Freq: Two times a day (BID) | INTRAMUSCULAR | Status: DC
Start: 2014-06-17 — End: 2014-06-22
  Administered 2014-06-17 – 2014-06-20 (×7): 3 mL via INTRAVENOUS

## 2014-06-17 MED ORDER — HYDRALAZINE HCL 10 MG PO TABS
10.0000 mg | ORAL_TABLET | Freq: Two times a day (BID) | ORAL | Status: DC
  Administered 2014-06-17 – 2014-06-19 (×6): 10 mg via ORAL
  Filled 2014-06-17 (×9): qty 1

## 2014-06-17 MED ORDER — INSULIN ASPART 100 UNIT/ML ~~LOC~~ SOLN
0.0000 [IU] | Freq: Three times a day (TID) | SUBCUTANEOUS | Status: DC
  Administered 2014-06-17: 4 [IU] via SUBCUTANEOUS
  Administered 2014-06-18: 2 [IU] via SUBCUTANEOUS
  Administered 2014-06-18: 4 [IU] via SUBCUTANEOUS
  Administered 2014-06-18: 2 [IU] via SUBCUTANEOUS
  Administered 2014-06-19 (×2): 8 [IU] via SUBCUTANEOUS
  Administered 2014-06-19 – 2014-06-20 (×2): 2 [IU] via SUBCUTANEOUS
  Administered 2014-06-20: 4 [IU] via SUBCUTANEOUS
  Administered 2014-06-20: 2 [IU] via SUBCUTANEOUS
  Administered 2014-06-20: 8 [IU] via SUBCUTANEOUS
  Administered 2014-06-21: 4 [IU] via SUBCUTANEOUS
  Administered 2014-06-21: 12 [IU] via SUBCUTANEOUS
  Administered 2014-06-21: 8 [IU] via SUBCUTANEOUS
  Administered 2014-06-21: 4 [IU] via SUBCUTANEOUS
  Administered 2014-06-22: 12 [IU] via SUBCUTANEOUS
  Administered 2014-06-22: 2 [IU] via SUBCUTANEOUS

## 2014-06-17 MED ORDER — TRAZODONE HCL 100 MG PO TABS
100.0000 mg | ORAL_TABLET | Freq: Every day | ORAL | Status: DC
Start: 2014-06-17 — End: 2014-06-22
  Administered 2014-06-17 – 2014-06-21 (×5): 100 mg via ORAL
  Filled 2014-06-17 (×6): qty 1

## 2014-06-17 MED ORDER — FUROSEMIDE 40 MG PO TABS
40.0000 mg | ORAL_TABLET | Freq: Every day | ORAL | Status: DC
  Administered 2014-06-17 – 2014-06-22 (×6): 40 mg via ORAL
  Filled 2014-06-17 (×6): qty 1

## 2014-06-17 MED ORDER — MAGNESIUM HYDROXIDE 400 MG/5ML PO SUSP: 30.0000 mL | Freq: Every day | ORAL | Status: DC | PRN

## 2014-06-17 MED ORDER — ATORVASTATIN CALCIUM 40 MG PO TABS
40.0000 mg | ORAL_TABLET | Freq: Every day | ORAL | Status: DC
  Administered 2014-06-17 – 2014-06-21 (×5): 40 mg via ORAL
  Filled 2014-06-17 (×6): qty 1

## 2014-06-17 NOTE — Evaluation (Signed)
Physical Therapy Evaluation Patient Details Name: Joyce [name] MRN: [id number] DOB: 13-Dec-1940 Today's Date: 06/17/2014   History of Present Illness  73 y.o. female s/p CORONARY ARTERY BYPASS GRAFTING (CABG)   Clinical Impression  Patient is seen following the above procedure and presents with functional limitations due to the deficits listed below (see PT Problem List). Ambulates slowly up to 110 feet today with an assistive devices for stability. SpO2 down to 86% on room air but quickly rises to 90% and greater with cues for pursed lip breathing (98% on 2L supplemental O2 at rest.) Reviewed sternal precautions and requires min assist for bed mobility. Patient will benefit from skilled PT to increase their independence and safety with mobility to allow discharge to the venue listed below.       Follow Up Recommendations SNF    Equipment Recommendations  3in1 (PT)    Recommendations for Other Services       Precautions / Restrictions Precautions Precautions: Sternal Precaution Comments: handout provided and reviewed Restrictions Weight Bearing Restrictions: Yes Other Position/Activity Restrictions: sternal precautions.      Mobility  Bed Mobility Overal bed mobility: Needs Assistance Bed Mobility: Rolling;Sidelying to Sit;Sit to Sidelying Rolling: Supervision Sidelying to sit: Min assist     Sit to sidelying: Min assist General bed mobility comments: Min assist for truncal support with education for log roll technique in order to maintain sternal precautions.  Transfers Overall transfer level: Needs assistance Equipment used: Rolling walker (2 wheeled) Transfers: Sit to/from Stand Sit to Stand: Min guard         General transfer comment: Close guard for safety. VC for hand placement to maintain sternal precautions.   Ambulation/Gait Ambulation/Gait assistance: Min guard Ambulation Distance (Feet): 110 Feet Assistive device: Rolling walker (2 wheeled) Gait  Pattern/deviations: Step-through pattern;Decreased stride length Gait velocity: decreased   General Gait Details: VC for light touch only through UEs on RW for stability to maintain sternal precautions. VC for pursed lip breathing technique. SpO2 lowest at 86% on room air. Quickly returns to 90% while ambulating with cues for pursed lip breathing and standing rest break. No loss of balance during bout. Required 2 standing rest breaks to complete distance.  Stairs            Wheelchair Mobility    Modified Rankin (Stroke Patients Only)       Balance Overall balance assessment: Needs assistance Sitting-balance support: No upper extremity supported;Feet supported Sitting balance-Leahy Scale: Fair     Standing balance support: No upper extremity supported Standing balance-Leahy Scale: Fair                               Pertinent Vitals/Pain Pain Assessment: No/denies pain  HR 90-101 bpm SpO2 98% on 2L supplemental O2 at rest SpO2 86-90% on room air ambulating    Home Living Family/patient expects to be discharged to:: Private residence Living Arrangements: Alone Available Help at Discharge: Family (family lives next door - but work) Type of Home: House Home Access: Stairs to enter Entrance Stairs-Rails: Right Entrance Stairs-Number of Steps: 3 Home Layout: Two level;Able to live on main level with bedroom/bathroom Home Equipment: Gilford Rile - 2 wheels;Cane - single point;Bedside commode      Prior Function Level of Independence: Independent               Hand Dominance   Dominant Hand: Right    Extremity/Trunk Assessment   Upper Extremity  Assessment: Defer to OT evaluation           Lower Extremity Assessment: Generalized weakness         Communication   Communication: No difficulties  Cognition Arousal/Alertness: Awake/alert Behavior During Therapy: WFL for tasks assessed/performed Overall Cognitive Status: Within Functional Limits  for tasks assessed                      General Comments      Exercises General Exercises - Lower Extremity Ankle Circles/Pumps: AROM;Both;10 reps;Supine Quad Sets: Strengthening;Both;10 reps;Supine      Assessment/Plan    PT Assessment Patient needs continued PT services  PT Diagnosis Difficulty walking;Abnormality of gait;Generalized weakness   PT Problem List Decreased strength;Decreased activity tolerance;Decreased range of motion;Decreased balance;Decreased mobility;Decreased knowledge of use of DME;Decreased knowledge of precautions;Cardiopulmonary status limiting activity;Pain  PT Treatment Interventions DME instruction;Gait training;Functional mobility training;Therapeutic activities;Therapeutic exercise;Balance training;Neuromuscular re-education;Patient/family education;Modalities   PT Goals (Current goals can be found in the Care Plan section) Acute Rehab PT Goals Patient Stated Goal: Get better and go to the gym with my daughter PT Goal Formulation: With patient Time For Goal Achievement: 07/01/14 Potential to Achieve Goals: Good    Frequency Min 3X/week   Barriers to discharge Decreased caregiver support Lives alone.    Co-evaluation               End of Session Equipment Utilized During Treatment: Gait belt Activity Tolerance: Patient tolerated treatment well Patient left: in chair;with call bell/phone within reach Nurse Communication: Mobility status         Time: [id number] PT Time Calculation (min) (ACUTE ONLY): 29 min   Charges:   PT Evaluation $Initial PT Evaluation Tier I: 1 Procedure PT Treatments $Gait Training: 8-22 mins   PT G CodesEllouise Newer 06/17/2014, 2:53 PM Camille Bal [place], [place]

## 2014-06-17 NOTE — Progress Notes (Signed)
2 Days Post-Op Procedure(s) (LRB): CORONARY ARTERY BYPASS GRAFTING (CABG) (N/A) INTRAOPERATIVE TRANSESOPHAGEAL ECHOCARDIOGRAM (N/A) Subjective: Postop day 2 multivessel CABG for unstable angina Patient remains hemodynamically stable and in sinus rhythm Chest x-ray with minimal atelectasis We'll transfer to stepdown today  Objective: Vital signs in last 24 hours: Temp:  [98.4 F (36.9 C)-99.2 F (37.3 C)] 99.2 F (37.3 C) (12/23 1233) Pulse Rate:  [67-85] 85 (12/23 1233) Cardiac Rhythm:  [-] Normal sinus rhythm (12/23 0900) Resp:  [14-25] 22 (12/23 1233) BP: (92-163)/(62-94) 163/68 mmHg (12/23 1233) SpO2:  [91 %-97 %] 96 % (12/23 1233) Weight:  [204 lb 9.4 oz (92.8 kg)] 204 lb 9.4 oz (92.8 kg) (12/23 0600)  Hemodynamic parameters for last 24 hours:    Intake/Output from previous day: 12/22 0701 - 12/23 0700 In: 1640.5 [P.O.:700; I.V.:640.5; IV Piggyback:300] Out: [id number] [Urine:1230; Chest Tube:220] Intake/Output this shift:    Lungs clear Minimal edema Extremities warm  Lab Results:  Recent Labs  06/16/14 1704 06/16/14 1712 06/17/14 0430  WBC 9.7  --  7.6  HGB 10.9* 10.9* 10.7*  HCT 33.0* 32.0* 32.2*  PLT 150  --  121*   BMET:  Recent Labs  06/16/14 0450  06/16/14 1712 06/17/14 0430  NA 139  --  137 136  K 4.2  --  4.1 4.0  CL 111  --  103 105  CO2 23  --   --  24  GLUCOSE 103*  --  104* 127*  BUN 18  --  22 23  CREATININE 1.51*  < > 1.90* 1.91*  CALCIUM 8.2*  --   --  8.3*  < > = values in this interval not displayed.  PT/INR:  Recent Labs  06/15/14 1300  LABPROT 15.5*  INR 1.21   ABG    Component Value Date/Time   PHART 7.295* 06/15/2014 2013   HCO3 19.7* 06/15/2014 2013   TCO2 18 06/16/2014 1712   ACIDBASEDEF 6.0* 06/15/2014 2013   O2SAT 97.0 06/15/2014 2013   CBG (last 3)   Recent Labs  06/17/14 0744 06/17/14 1057 06/17/14 1610  GLUCAP 122* 205* 175*    Assessment/Plan: S/P Procedure(s) (LRB): CORONARY ARTERY BYPASS GRAFTING  (CABG) (N/A) INTRAOPERATIVE TRANSESOPHAGEAL ECHOCARDIOGRAM (N/A) Plan for transfer to step-down: see transfer orders   LOS: 7 days    [name],[name] 06/17/2014

## 2014-06-17 NOTE — Progress Notes (Signed)
1435 Pt just walked with PT. We will follow up tomorrow. Graylon Good RN BSN 06/17/2014 2:35 PM

## 2014-06-18 ENCOUNTER — Inpatient Hospital Stay (HOSPITAL_COMMUNITY): Payer: Medicare Other

## 2014-06-18 LAB — GLUCOSE, CAPILLARY
GLUCOSE-CAPILLARY: 163 mg/dL — AB (ref 70–99)
GLUCOSE-CAPILLARY: 126 mg/dL — AB (ref 70–99)
Glucose-Capillary: 115 mg/dL — ABNORMAL HIGH (ref 70–99)
Glucose-Capillary: 153 mg/dL — ABNORMAL HIGH (ref 70–99)

## 2014-06-18 LAB — CBC
HCT: 28.8 % — ABNORMAL LOW (ref 36.0–46.0)
Hemoglobin: 9.6 g/dL — ABNORMAL LOW (ref 12.0–15.0)
MCH: 29.9 pg (ref 26.0–34.0)
MCHC: 33.3 g/dL (ref 30.0–36.0)
MCV: 89.7 fL (ref 78.0–100.0)
Platelets: 97 10*3/uL — ABNORMAL LOW (ref 150–400)
RBC: 3.21 MIL/uL — ABNORMAL LOW (ref 3.87–5.11)
RDW: 14 % (ref 11.5–15.5)
WBC: 4.7 10*3/uL (ref 4.0–10.5)

## 2014-06-18 LAB — BASIC METABOLIC PANEL
Anion gap: 6 (ref 5–15)
BUN: 31 mg/dL — ABNORMAL HIGH (ref 6–23)
CO2: 24 mmol/L (ref 19–32)
Calcium: 8.1 mg/dL — ABNORMAL LOW (ref 8.4–10.5)
Chloride: 104 mEq/L (ref 96–112)
Creatinine, Ser: 1.91 mg/dL — ABNORMAL HIGH (ref 0.50–1.10)
GFR calc Af Amer: 29 mL/min — ABNORMAL LOW (ref >90)
GFR calc non Af Amer: 25 mL/min — ABNORMAL LOW (ref >90)
Glucose, Bld: 119 mg/dL — ABNORMAL HIGH (ref 70–99)
Potassium: 3.7 mmol/L (ref 3.5–5.1)
Sodium: 134 mmol/L — ABNORMAL LOW (ref 135–145)

## 2014-06-18 MED ORDER — LACTULOSE 10 GM/15ML PO SOLN
20.0000 g | Freq: Once | ORAL | Status: AC
  Administered 2014-06-18: 20 g via ORAL
  Filled 2014-06-18: qty 30

## 2014-06-18 MED ORDER — FE FUMARATE-B12-VIT C-FA-IFC PO CAPS
1.0000 | ORAL_CAPSULE | Freq: Every day | ORAL | Status: DC
  Administered 2014-06-19 – 2014-06-22 (×4): 1 via ORAL
  Filled 2014-06-18 (×5): qty 1

## 2014-06-18 MED ORDER — POTASSIUM CHLORIDE CRYS ER 10 MEQ PO TBCR
10.0000 meq | EXTENDED_RELEASE_TABLET | Freq: Once | ORAL | Status: AC
  Administered 2014-06-18: 10 meq via ORAL
  Filled 2014-06-18: qty 1

## 2014-06-18 NOTE — Progress Notes (Signed)
Physical Therapy Treatment Patient Details Name: Joyce [name] MRN: [id number] DOB: 1941/03/14 Today's Date: 06/18/2014    History of Present Illness 73 y.o. female s/p CORONARY ARTERY BYPASS GRAFTING (CABG)     PT Comments    Progressing towards physical therapy goals. Ambulated up to 210 feet today with 1L supplemental O2, SpO2 at 91-93% throughout therapy session. Requires physical assist for bed mobility, and cues for sternal precautions. Patient will continue to benefit from skilled physical therapy services to further improve independence with functional mobility.   Follow Up Recommendations  SNF     Equipment Recommendations  3in1 (PT)    Recommendations for Other Services       Precautions / Restrictions Precautions Precautions: Sternal Precaution Comments: Reviewed precautions Restrictions Weight Bearing Restrictions: Yes Other Position/Activity Restrictions: sternal precautions.    Mobility  Bed Mobility Overal bed mobility: Needs Assistance Bed Mobility: Rolling;Sidelying to Sit;Sit to Sidelying Rolling: Supervision Sidelying to sit: Min assist     Sit to sidelying: Min assist General bed mobility comments: Min assist for truncal support with education for log roll technique in order to maintain sternal precautions. Poor control returning to bed, rushing to lie down, did not follow log roll instructions required LE support  Transfers Overall transfer level: Needs assistance Equipment used: Rolling walker (2 wheeled) Transfers: Sit to/from Stand Sit to Stand: Min guard         General transfer comment: Close guard for safety. VC for hand placement to maintain sternal precautions.   Ambulation/Gait Ambulation/Gait assistance: Min guard Ambulation Distance (Feet): 210 Feet Assistive device: Rolling walker (2 wheeled) Gait Pattern/deviations: Step-through pattern;Decreased stride length;Trunk flexed Gait velocity: decreased   General Gait  Details: Frequent cues for upright posture. Required one standing rest break to complete distance today. Educated on continuous pursed lip breathing when feeling dyspneic. Ambulated on 1L supplemental O2 and maintained 91-93% SpO2 throughout therapy session. HR elevated to 102 during ambulation   Stairs            Wheelchair Mobility    Modified Rankin (Stroke Patients Only)       Balance                                    Cognition Arousal/Alertness: Awake/alert Behavior During Therapy: WFL for tasks assessed/performed Overall Cognitive Status: Within Functional Limits for tasks assessed                      Exercises      General Comments General comments (skin integrity, edema, etc.): 95% SpO2 on 1.5L at rest, HR 83. Elevated HR to 102 during ambulation.      Pertinent Vitals/Pain Pain Assessment: 0-10 Pain Score:  ("I'm okay now, just had my pain medicine") Pain Location: sternum Pain Intervention(s): Monitored during session;Premedicated before session;Repositioned    Home Living                      Prior Function            PT Goals (current goals can now be found in the care plan section) Acute Rehab PT Goals PT Goal Formulation: With patient Time For Goal Achievement: 07/01/14 Potential to Achieve Goals: Good Progress towards PT goals: Progressing toward goals    Frequency  Min 3X/week    PT Plan Current plan remains appropriate    Co-evaluation  End of Session Equipment Utilized During Treatment: Gait belt;Oxygen Activity Tolerance: Patient tolerated treatment well Patient left: with call bell/phone within reach;in bed;with family/visitor present     Time: [id number] PT Time Calculation (min) (ACUTE ONLY): 18 min  Charges:  $Gait Training: 8-22 mins                    G Codes:      Ellouise Newer 14-Jul-2014, 4:19 PM Camille Bal [place], [place]

## 2014-06-18 NOTE — Progress Notes (Signed)
Nutrition Brief Note  Patient identified on the Malnutrition Screening Tool (MST) Report.  Pt states that she thinks she lost a few pounds during this admission but appetite has improved. Pt ate 100% of her Breakfast this am. Pt plans to go to SNF for rehab in [place], near her home, on Friday or Saturday.   Wt Readings from Last 15 Encounters:  06/18/14 204 lb 2.3 oz (92.6 kg)    Body mass index is 33.97 kg/(m^2). Patient meets criteria for obesity class I based on current BMI.   Current diet order is Heart Healthy/CHO Modified, patient is consuming approximately 100% of meals at this time. Labs and medications reviewed.   No nutrition interventions warranted at this time. If nutrition issues arise, please consult RD.   [place], [place], [place] Pager [phone] After Hours Pager

## 2014-06-18 NOTE — Discharge Summary (Signed)
[address]       [place] [address]             [phone]              Discharge Summary  Name: NATALEE TAORMINA DOB: 05-12-1941 73 y.o. MRN: [id number]   Admission Date: 06/10/2014 Discharge Date: 06/22/2014    Admitting Diagnosis: Chest pain   Discharge Diagnosis:  Non-ST elevation myocardial infarction Severe 3 vessel coronary artery disease Expected postoperative blood loss anemia  Past Medical History  Diagnosis Date  . Chronic kidney disease   . Diabetes mellitus without complication   . Hypertension   . Anginal pain      Procedures: CORONARY ARTERY BYPASS GRAFTING x 4 (Left internal mammary artery to left anterior descending, saphenous vein graft to diagonal, saphenous vein graft to ramus intermediate, saphenous vein graft to posterior descending) ENDOSCOPIC VEIN HARVEST RIGHT LEG - 06/15/2014    HPI:  The patient is a 73 y.o. female who presented as a transfer from [facility] to [facility] on 06/10/2014 with complaints of chest pain which started that morning. The patient reports a several month history of exertional chest discomfort. She reports central chest discomfort that occurs with exertion such as housework. The pain radiates to the left arm and jaw and is relieved with rest.   A few weeks back, she underwent a chemical stress test which she reports was negative. However, the symptoms recurred and on the date of admission, after discussing her situation with a relative who is a resident at [facility], she sought care from her PCP.  She was noted to have EKG changes and was subsequently referred to the ER. At the ER, she was found to have an elevated troponin prompting transfer to [facility] for [facility]ology workup.    Hospital Course:  The patient was admitted to [facility] on 06/10/2014. She ruled in for NSTEMI. Cardiac catheterization was performed on 06/11/2014 and demonstrated severe three-vessel  coronary disease with preserved LV systolic function, LVEDP 19 mmHg Tthere was an 80-90% LAD stenosis, stenosis of the first diagonal of 80%, circumflex marginal 90% stenosis and 80-90% stenosis the RCA. A cardiac surgery consult was requested and Dr. Prescott Gum saw the patient. It was felt that she would benefit from surgical revascularization.  All risks, benefits and alternatives of surgery were explained in detail, and the patient agreed to proceed.   The patient was taken to the operating room and underwent the above procedure.    The postoperative course has generally been uneventful.  She has remained in sinus rhythm.  Her blood pressures have been trending up, and medications have been titrated accordingly. Creatinine has been generally stable postop (baseline 1.6).  Last creatine was down to 1.43. She was not restarted on her Metformin. The SNF may check the creatinine and if it is at her baseline or less, then may restart Metformin prior to discharge from SNF. Incisions are healing well.  She has had a mild blood loss anemia and has been started on supplemental iron, but has not required a transfusion. She is being diuresed for volume overload.  She is ambulating in the halls with cardiac rehab and tolerating a regular diet. It is felt that she would benefit from further rehab at a skilled nursing facility after discharge.  Presently, the patient is medically stable for transfer once a bed becomes available.  Recent vital signs:  Filed Vitals:  06/22/14 0418  BP: 151/57  Pulse: 92  Temp: 98.5 F (36.9 C)  Resp: 24    Recent laboratory studies:  CBC:No results for input(s): WBC, HGB, HCT, PLT in the last 72 hours. BMET:   Recent Labs  06/21/14 1050  NA 136  K 4.8  CL 101  CO2 26  GLUCOSE 219*  BUN 26*  CREATININE 1.43*  CALCIUM 9.0    PT/INR: No results for input(s): LABPROT, INR in the last 72 hours.   Discharge Medications:     Medication List    STOP taking these  medications        hydrochlorothiazide 25 MG tablet  Commonly known as:  HYDRODIURIL     HYDROcodone-acetaminophen 5-325 MG per tablet  Commonly known as:  NORCO/VICODIN     LANTUS 100 UNIT/ML injection  Generic drug:  insulin glargine     lisinopril 20 MG tablet  Commonly known as:  PRINIVIL,ZESTRIL     metFORMIN 1000 MG tablet  Commonly known as:  GLUCOPHAGE      TAKE these medications        amLODipine 10 MG tablet  Commonly known as:  NORVASC  Take 10 mg by mouth daily.     aspirin 325 MG EC tablet  Take 1 tablet (325 mg total) by mouth daily.     atorvastatin 40 MG tablet  Commonly known as:  LIPITOR  Take 1 tablet (40 mg total) by mouth daily at 6 PM.     feeding supplement (ENSURE COMPLETE) Liqd  Take 237 mLs by mouth 2 (two) times daily between meals. May stop after discharge from SNF.     ferrous Q000111Q C-folic acid capsule  Commonly known as:  TRINSICON / FOLTRIN  Take 1 capsule by mouth daily with breakfast. For one month then stop.     furosemide 40 MG tablet  Commonly known as:  LASIX  Take 1 tablet (40 mg total) by mouth daily. For 5 days then stop.     HUMALOG KWIKPEN 100 UNIT/ML KiwkPen  Generic drug:  insulin lispro  Inject 8 Units into the skin 3 (three) times daily with meals.     hydrALAZINE 25 MG tablet  Commonly known as:  APRESOLINE  Take 3 tablets (75 mg total) by mouth every 8 (eight) hours.     insulin aspart 100 UNIT/ML injection  Commonly known as:  novoLOG  - Inject 0-24 Units into the skin 4 (four) times daily -  before meals and at bedtime. Units of Insulin  - If CBG,120=0     251-300=12  - 121-160=2         301-350=16  - 161-200=4         351-450=20  - 201-250=8         >450=24 and call MD     primidone 50 MG tablet  Commonly known as:  MYSOLINE  Take 50 mg by mouth daily.     traMADol 50 MG tablet  Commonly known as:  ULTRAM  Take 1 tablet (50 mg total) by mouth every 6 (six) hours as needed for  moderate pain.     traZODone 100 MG tablet  Commonly known as:  DESYREL  Take 100 mg by mouth at bedtime.       Discharge Instructions:   1. Please obtain vital signs at least one time daily 2. Please weigh the patient daily. If he or she continues to gain weight or develops lower extremity edema, contact the office at [phone]. 3. Ambulate patient at least three times daily and please use sternal precautions. 4. The patient is to refrain from driving, heavy lifting or strenuous activity.   5. May shower daily and clean incisions with soap and water.   6. May resume regular diet.   Follow Up:  Discharge Instructions    Amb Referral to Cardiac Rehabilitation    Complete by:  As directed   To [place]          Follow-up Information    Follow up with VAN Wilber Oliphant, MD.   Specialty:  Cardiothoracic Surgery   Why:  Office will contact you with an appointment   Contact information:   [place] [address] [phone]       Follow up with Sherren Mocha, MD.   Specialty:  Cardiology   Why:  Call for a follow up appointment for 2 weeks   Contact information:   [id number] [place] [address] [phone]       Follow up with Medical Doctor.   Why:  Call for a follow up appointment regarding further diabetes management and surveillance of HGA1C 5.9       The patient has been discharged on:  1.Beta Blocker: Yes [  ]  No [x  ]  If No, reason: Documented allergy   2.Ace Inhibitor/ARB: Yes [ ]   No [ x]  If No, reason: Chronic kidney disease   3.Statin: Yes [ x ]  No [ ]   If No, reason:    4.Ecasa: Yes [ x ]  No [ ]   If No, reason:    ZIMMERMAN,DONIELLE M PA-C 06/22/2014, 9:17 AM

## 2014-06-18 NOTE — Progress Notes (Signed)
06/18/2014 2:29 PM Foley catheter removed per telephone verbal order Lars Pinks PAC.  Juanice Warburton, Arville Lime

## 2014-06-18 NOTE — Progress Notes (Signed)
CARDIAC REHAB PHASE I   PRE:  Rate/Rhythm: 89 SR    BP: sitting 141/52    SaO2: 90 RA  MODE:  Ambulation: 250 ft   POST:  Rate/Rhythm: 102 ST    BP: sitting 125/70     SaO2: 92 2L  Pt able to stand independently with correct mechanics. Slow pace with RW, assist x1, 2L O2 (SaO2 90 RA). Able to increase distance today. To recliner. Discussed sternal precautions, IS (only moving 500 mL, sts she could do 2250 mL pre-op), ex, diet, and CRPII. Pt interested in Why and will send referral to [facility]. Pt has already watched d/c video. Encouraged x2 more walks today. Very motivated.  [id number]  Josephina Shih Hoskins CES, ACSM 06/18/2014 10:25 AM

## 2014-06-18 NOTE — Care Management Note (Signed)
    Page 1 of 1   06/18/2014     1:56:13 PM CARE MANAGEMENT NOTE 06/18/2014  Patient:  Joyce [name], Joyce [name]   Account Number:  [id number]  Date Initiated:  06/16/2014  Documentation initiated by:  [facility]  Subjective/Objective Assessment:   Admitted with NSTEMI - now post op CABG on 12-21     Action/Plan:   Anticipated DC Date:  06/19/2014   Anticipated DC Plan:  [place]  CM consult      Choice offered to / List presented to:             Status of service:  In process, will continue to follow Medicare Important Message given?  YES (If response is "NO", the following Medicare IM given date fields will be blank) Date Medicare IM given:  06/18/2014 Medicare IM given by:  [facility] Date Additional Medicare IM given:   Additional Medicare IM given by:    Discharge Disposition:    Per UR Regulation:  Reviewed for med. necessity/level of care/duration of stay  If discussed at [place] of Stay Meetings, dates discussed:   06/18/2014    Comments:  Contact:  H[place]ock,Detra Sister [place] A  [phone]     Anell Barr  [phone]  06-18-14 2pm Luz Lex, RNBSN [phone] Plan for SNF - PT recommending - SW updated.  Consult placed.     06-16-14  12:45am Luz Lex, RNBSN  [phone] Post op 12-21.  CM will follow for progression.

## 2014-06-18 NOTE — Progress Notes (Addendum)
      [address]       [place] [address]             [phone]        3 Days Post-Op Procedure(s) (LRB): CORONARY ARTERY BYPASS GRAFTING (CABG) (N/A) INTRAOPERATIVE TRANSESOPHAGEAL ECHOCARDIOGRAM (N/A)  Subjective: Her only complaint is constipation  Objective: Vital signs in last 24 hours: Temp:  [98.1 F (36.7 C)-99.2 F (37.3 C)] 98.1 F (36.7 C) (12/24 0426) Pulse Rate:  [75-85] 79 (12/24 0426) Cardiac Rhythm:  [-] Normal sinus rhythm (12/23 1945) Resp:  [15-25] 20 (12/24 0426) BP: (134-163)/(60-94) 134/60 mmHg (12/24 0426) SpO2:  [94 %-97 %] 97 % (12/24 0426) Weight:  [204 lb 2.3 oz (92.6 kg)] 204 lb 2.3 oz (92.6 kg) (12/24 0426)  Pre op weight 87 kg Current Weight  06/18/14 204 lb 2.3 oz (92.6 kg)      Intake/Output from previous day: 12/23 0701 - 12/24 0700 In: 280 [P.O.:200; I.V.:80] Out: 875 [Urine:875]   Physical Exam:  Cardiovascular: RRR, no murmurs, gallops, or rubs. Pulmonary: Clear to auscultation bilaterally; no rales, wheezes, or rhonchi. Abdomen: Soft, non tender, bowel sounds present. Extremities: Mild bilateral lower extremity edema. Wounds: Clean and dry.  No erythema or signs of infection.  Lab Results: CBC: Recent Labs  06/17/14 0430 06/18/14 0525  WBC 7.6 4.7  HGB 10.7* 9.6*  HCT 32.2* 28.8*  PLT 121* PENDING   BMET:  Recent Labs  06/17/14 0430 06/18/14 0525  NA 136 134*  K 4.0 3.7  CL 105 104  CO2 24 24  GLUCOSE 127* 119*  BUN 23 31*  CREATININE 1.91* 1.91*  CALCIUM 8.3* 8.1*    PT/INR:  Lab Results  Component Value Date   INR 1.21 06/15/2014   INR 1.04 06/13/2014   INR 1.00 07/20/2010   ABG:  INR: Will add last result for INR, ABG once components are confirmed Will add last 4 CBG results once components are confirmed  Assessment/Plan:  1. CV - SR. On Norvasc 5 daily and Hydralazine 10 bid. Will increase Hydralazine for better BP control. Not on BB as is allergic. 2.  Pulmonary -  CXR this am shows bibasilar atelectasis, no pneumothorax, cardiomegaly, small right pleural effusion.Encourage incentive spirometer 3. Volume Overload - On Lasix 40 daily 4.  Acute blood loss anemia - H and H decreased to 9.6 and 28.8. Start Trinsicon 5.DM-CBGs 175/108/115. On Insulin. Pre op HGA1C 5.9. 6. Creatinine remains 1.91 7. Gently supplement potassium 8. Thrombocytopenia-platelets pending this am 9. Remove EPW in am 10. Lactulose for constipation 11. Will need SNF at discharge-will be ready in soon   ZIMMERMAN,DONIELLE MPA-C 06/18/2014,7:31 AM  Platelets are stable at 97,000 Creatinine postop has plateaued at 1.9-patient still fluid overloaded and we'll continue 40 Lasix by mouth daily and follow creatinine--baseline creatinine preop 1.6 Patient will need skilled nursing facility next week The patient has documented allergy to beta blockers and will not be discharged on a beta blocker for that reason

## 2014-06-19 LAB — GLUCOSE, CAPILLARY
GLUCOSE-CAPILLARY: 119 mg/dL — AB (ref 70–99)
GLUCOSE-CAPILLARY: 153 mg/dL — AB (ref 70–99)
GLUCOSE-CAPILLARY: 230 mg/dL — AB (ref 70–99)
Glucose-Capillary: 233 mg/dL — ABNORMAL HIGH (ref 70–99)

## 2014-06-19 LAB — BASIC METABOLIC PANEL
ANION GAP: 7 (ref 5–15)
BUN: 32 mg/dL — ABNORMAL HIGH (ref 6–23)
CALCIUM: 8.3 mg/dL — AB (ref 8.4–10.5)
CO2: 25 mmol/L (ref 19–32)
CREATININE: 1.65 mg/dL — AB (ref 0.50–1.10)
Chloride: 103 mEq/L (ref 96–112)
GFR calc non Af Amer: 30 mL/min — ABNORMAL LOW (ref >90)
GFR, EST AFRICAN AMERICAN: 35 mL/min — AB (ref >90)
Glucose, Bld: 140 mg/dL — ABNORMAL HIGH (ref 70–99)
Potassium: 4 mmol/L (ref 3.5–5.1)
Sodium: 135 mmol/L (ref 135–145)

## 2014-06-19 MED ORDER — AMLODIPINE BESYLATE 10 MG PO TABS
10.0000 mg | ORAL_TABLET | Freq: Every day | ORAL | Status: DC
  Administered 2014-06-19 – 2014-06-22 (×4): 10 mg via ORAL
  Filled 2014-06-19 (×4): qty 1

## 2014-06-19 MED ORDER — LEVALBUTEROL HCL 0.63 MG/3ML IN NEBU: 0.6300 mg | INHALATION_SOLUTION | Freq: Three times a day (TID) | RESPIRATORY_TRACT | Status: DC | PRN

## 2014-06-19 NOTE — Progress Notes (Addendum)
[address]       [place] [address]             [phone]      4 Days Post-Op Procedure(s) (LRB): CORONARY ARTERY BYPASS GRAFTING (CABG) (N/A) INTRAOPERATIVE TRANSESOPHAGEAL ECHOCARDIOGRAM (N/A) Subjective: No specific complaints, conts to progress nicely  Objective: Vital signs in last 24 hours: Temp:  [98.4 F (36.9 C)-98.6 F (37 C)] 98.4 F (36.9 C) (12/25 0427) Pulse Rate:  [82-87] 82 (12/25 0427) Cardiac Rhythm:  [-] Normal sinus rhythm (12/24 1920) Resp:  [20-24] 22 (12/25 0427) BP: (141-174)/(52-83) 143/62 mmHg (12/25 0427) SpO2:  [94 %-97 %] 97 % (12/25 0427) Weight:  [206 lb 9.1 oz (93.7 kg)] 206 lb 9.1 oz (93.7 kg) (12/25 0427)  Hemodynamic parameters for last 24 hours:    Intake/Output from previous day: 12/24 0701 - 12/25 0700 In: -  Out: 400 [Urine:400] Intake/Output this shift:    General appearance: alert, cooperative and no distress Heart: regular rate and rhythm Lungs: dim in bases Abdomen: benign Extremities: some LE edema Wound: incis healing well   Lab Results:  Recent Labs  06/17/14 0430 06/18/14 0525  WBC 7.6 4.7  HGB 10.7* 9.6*  HCT 32.2* 28.8*  PLT 121* 97*   BMET:  Recent Labs  06/18/14 0525 06/19/14 0321  NA 134* 135  K 3.7 4.0  CL 104 103  CO2 24 25  GLUCOSE 119* 140*  BUN 31* 32*  CREATININE 1.91* 1.65*  CALCIUM 8.1* 8.3*    PT/INR: No results for input(s): LABPROT, INR in the last 72 hours. ABG    Component Value Date/Time   PHART 7.295* 06/15/2014 2013   HCO3 19.7* 06/15/2014 2013   TCO2 18 06/16/2014 1712   ACIDBASEDEF 6.0* 06/15/2014 2013   O2SAT 97.0 06/15/2014 2013   CBG (last 3)   Recent Labs  06/18/14 1632 06/18/14 2100 06/19/14 0559  GLUCAP 126* 153* 119*    Meds Scheduled Meds: . acetaminophen  1,000 mg Oral 4 times per day   Or  . acetaminophen (TYLENOL) oral liquid 160 mg/5 mL  1,000 mg Per Tube 4 times per day  . amLODipine  5 mg Oral Daily  . aspirin EC   325 mg Oral Daily   Or  . aspirin  324 mg Per Tube Daily  . atorvastatin  40 mg Oral q1800  . bisacodyl  10 mg Oral Daily   Or  . bisacodyl  10 mg Rectal Daily  . docusate sodium  200 mg Oral Daily  . ferrous Q000111Q C-folic acid  1 capsule Oral Q breakfast  . furosemide  40 mg Oral Daily  . hydrALAZINE  10 mg Oral BID  . insulin aspart  0-24 Units Subcutaneous TID AC & HS  . insulin aspart  4 Units Subcutaneous TID WC  . insulin detemir  15 Units Subcutaneous BID  . pantoprazole  40 mg Oral Daily  . primidone  50 mg Oral QHS  . sodium chloride  3 mL Intravenous Q12H  . sodium chloride  3 mL Intravenous Q12H  . traZODone  100 mg Oral QHS   Continuous Infusions: . sodium chloride 20 mL/hr at 06/16/14 0700  . sodium chloride    . sodium chloride 20 mL/hr (06/15/14 1400)  . lactated ringers 20 mL/hr at 06/17/14 0700   PRN Meds:.sodium chloride, magnesium hydroxide, ondansetron (ZOFRAN) IV, sodium chloride, sodium chloride, traMADol  Xrays Dg Chest 2 View  06/18/2014   CLINICAL  DATA:  Status post coronary artery bypass grafting.  EXAM: CHEST  2 VIEW  COMPARISON:  June 17, 2014  FINDINGS: Cordis has been removed. No pneumothorax. 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 No pneumothorax. There is bibasilar patchy atelectasis with small right effusion. There is underlying emphysema. Lungs elsewhere clear. Heart is enlarged with pulmonary vascularity within normal limits. No adenopathy. No bone lesions.  IMPRESSION: Patchy atelectatic change in both lower lobes with small right effusion. Underlying emphysematous change. No new opacity. No pneumothorax. Stable cardiac enlargement.   Electronically Signed   By: Lowella Grip M.D.   On: 06/18/2014 07:52    Assessment/Plan: S/P Procedure(s) (LRB): CORONARY ARTERY BYPASS GRAFTING (CABG) (N/A) INTRAOPERATIVE TRANSESOPHAGEAL ECHOCARDIOGRAM (N/A)  1 conts to progress nicely 2 hemodyn stable but  hypertensive- increase norvasc- won't use ACE/ARB with renal insuff., creat is improved 3 in sinus rhythm 4 cont gentle diuresis for volume overload 5 cont . Rehab- pulm toilet 6 SW consult for ST SNF as has no assistance at home  LOS: 9 days    GOLD,[name] 06/19/2014  I have seen and examined [name] and agree with the above assessment  and plan.  Grace Isaac MD [place] [phone] Office [phone] 06/19/2014 11:58 AM

## 2014-06-19 NOTE — Progress Notes (Signed)
Patient tolerated ambulation in hallway with rolling walker 350 feet, with 1+ assist Joylene Draft A

## 2014-06-19 NOTE — Progress Notes (Signed)
Pt ambulated in hallway 75 feet, audible expiratory wheezes noted , room air sats 97 %, patient requesting O2 be replaced, placed on at [employer], [place] A

## 2014-06-20 DIAGNOSIS — Z951 Presence of aortocoronary bypass graft: Secondary | ICD-10-CM

## 2014-06-20 LAB — GLUCOSE, CAPILLARY
GLUCOSE-CAPILLARY: 285 mg/dL — AB (ref 70–99)
Glucose-Capillary: 145 mg/dL — ABNORMAL HIGH (ref 70–99)
Glucose-Capillary: 175 mg/dL — ABNORMAL HIGH (ref 70–99)
Glucose-Capillary: 210 mg/dL — ABNORMAL HIGH (ref 70–99)

## 2014-06-20 MED ORDER — INSULIN DETEMIR 100 UNIT/ML ~~LOC~~ SOLN
25.0000 [IU] | Freq: Two times a day (BID) | SUBCUTANEOUS | Status: DC
Start: 2014-06-20 — End: 2014-06-21
  Administered 2014-06-20: 25 [IU] via SUBCUTANEOUS
  Filled 2014-06-20 (×3): qty 0.25

## 2014-06-20 MED ORDER — HYDRALAZINE HCL 50 MG PO TABS
50.0000 mg | ORAL_TABLET | Freq: Three times a day (TID) | ORAL | Status: DC
  Administered 2014-06-20 – 2014-06-22 (×5): 50 mg via ORAL
  Filled 2014-06-20 (×8): qty 1

## 2014-06-20 MED ORDER — ENSURE COMPLETE PO LIQD
237.0000 mL | Freq: Two times a day (BID) | ORAL | Status: DC
Start: 2014-06-20 — End: 2014-06-22
  Administered 2014-06-20 – 2014-06-22 (×5): 237 mL via ORAL

## 2014-06-20 NOTE — Progress Notes (Signed)
CARDIAC REHAB PHASE I   PRE:  Rate/Rhythm: 95  BP:  Sitting: 142/60     SaO2: 94% 2l  MODE:  Ambulation: 250 ft   POST:  Rate/Rhythm: 103  BP:  Sitting: 168/68     SaO2: 95%l  12:55pm-1:20pm  Patient walked with x1 assist and rolling walker.  Patient conversated the entire walk but needed to stop and take 2 rest breaks due to fatigue and shortness of breath. Patient was placed back in her chair with call bell in reach.  I reminded the patient how important it was to utilize her incentive spirometer.  The patient stated that she had not used it at all today.   Joyce [name] D, MS 06/20/2014 1:16 PM

## 2014-06-20 NOTE — Progress Notes (Signed)
Subjective:  No CP/SOB, POD # 5 CABG X4, NSTEMI , 3VD  Objective:  Temp:  [98 F (36.7 C)-99 F (37.2 C)] 98 F (36.7 C) (12/26 0446) Pulse Rate:  [87-92] 92 (12/26 0446) Resp:  [21-22] 21 (12/26 0446) BP: (158-193)/(61-76) 193/76 mmHg (12/26 0446) SpO2:  [94 %-96 %] 94 % (12/26 0446) Weight:  [204 lb 9.6 oz (92.806 kg)] 204 lb 9.6 oz (92.806 kg) (12/26 0446) Weight change: -1 lb 15.5 oz (-0.894 kg)  Intake/Output from previous day: 12/25 0701 - 12/26 0700 In: 720 [P.O.:720] Out: 450 [Urine:450]  Intake/Output from this shift:    Physical Exam: General appearance: alert and no distress Neck: no adenopathy, no carotid bruit, no JVD, supple, symmetrical, trachea midline and thyroid not enlarged, symmetric, no tenderness/mass/nodules Lungs: clear to auscultation bilaterally Heart: regular rate and rhythm, S1, S2 normal, no murmur, click, rub or gallop Extremities: extremities normal, atraumatic, no cyanosis or edema  Lab Results: Results for orders placed or performed during the hospital encounter of 06/10/14 (from the past 48 hour(s))  Glucose, capillary     Status: Abnormal   Collection Time: 06/18/14 11:41 AM  Result Value Ref Range   Glucose-Capillary 163 (H) 70 - 99 mg/dL  Glucose, capillary     Status: Abnormal   Collection Time: 06/18/14  4:32 PM  Result Value Ref Range   Glucose-Capillary 126 (H) 70 - 99 mg/dL  Glucose, capillary     Status: Abnormal   Collection Time: 06/18/14  9:00 PM  Result Value Ref Range   Glucose-Capillary 153 (H) 70 - 99 mg/dL  Basic metabolic panel     Status: Abnormal   Collection Time: 06/19/14  3:21 AM  Result Value Ref Range   Sodium 135 135 - 145 mmol/L    Comment: Please note change in reference range.   Potassium 4.0 3.5 - 5.1 mmol/L    Comment: Please note change in reference range.   Chloride 103 96 - 112 mEq/L   CO2 25 19 - 32 mmol/L   Glucose, Bld 140 (H) 70 - 99 mg/dL   BUN 32 (H) 6 - 23 mg/dL   Creatinine, Ser  1.65 (H) 0.50 - 1.10 mg/dL   Calcium 8.3 (L) 8.4 - 10.5 mg/dL   GFR calc non Af Amer 30 (L) >90 mL/min   GFR calc Af Amer 35 (L) >90 mL/min    Comment: (NOTE) The eGFR has been calculated using the CKD EPI equation. This calculation has not been validated in all clinical situations. eGFR's persistently <90 mL/min signify possible Chronic Kidney Disease.    Anion gap 7 5 - 15  Glucose, capillary     Status: Abnormal   Collection Time: 06/19/14  5:59 AM  Result Value Ref Range   Glucose-Capillary 119 (H) 70 - 99 mg/dL  Glucose, capillary     Status: Abnormal   Collection Time: 06/19/14 11:21 AM  Result Value Ref Range   Glucose-Capillary 233 (H) 70 - 99 mg/dL  Glucose, capillary     Status: Abnormal   Collection Time: 06/19/14  4:49 PM  Result Value Ref Range   Glucose-Capillary 153 (H) 70 - 99 mg/dL  Glucose, capillary     Status: Abnormal   Collection Time: 06/19/14  9:25 PM  Result Value Ref Range   Glucose-Capillary 230 (H) 70 - 99 mg/dL  Glucose, capillary     Status: Abnormal   Collection Time: 06/20/14  5:42 AM  Result Value Ref Range  Glucose-Capillary 145 (H) 70 - 99 mg/dL  Glucose, capillary     Status: Abnormal   Collection Time: 06/20/14  8:46 AM  Result Value Ref Range   Glucose-Capillary 285 (H) 70 - 99 mg/dL    Imaging: Imaging results have been reviewed  Tele: NSR  Assessment/Plan:   1. Principal Problem: 2.   NSTEMI (non-ST elevated myocardial infarction) 3. Active Problems: 4.   Chronic kidney disease (CKD), stage III (moderate) 5.   HTN (hypertension) 6.   Diabetes mellitus type 2 in obese 7.   S/P CABG x 4 8.   Time Spent Directly with Patient:  15 minutes  Length of Stay:  LOS: 10 days   POD # 5 CABG X4 in setting of NSTEMI and 3VD/preserved LV fxn by cath (Dr Burt Knack 12/17). Doing well. No CP/SOB. NSR. Exam benign. Labs OK. Ambulating with [facility]. Nl progression . Anticipate DC to SNF Monday. Will see again Monday AM and arrange  F/U.   Lorretta Harp 06/20/2014, 10:35 AM

## 2014-06-20 NOTE — Progress Notes (Addendum)
[address]       [place], [address]             [phone]      5 Days Post-Op Procedure(s) (LRB): CORONARY ARTERY BYPASS GRAFTING (CABG) (N/A) INTRAOPERATIVE TRANSESOPHAGEAL ECHOCARDIOGRAM (N/A) Subjective: Feels ok, no new issues   Objective: Vital signs in last 24 hours: Temp:  [98 F (36.7 C)-99 F (37.2 C)] 98 F (36.7 C) (12/26 0446) Pulse Rate:  [87-92] 92 (12/26 0446) Cardiac Rhythm:  [-] Normal sinus rhythm (12/26 0908) Resp:  [21-22] 21 (12/26 0446) BP: (158-193)/(61-76) 193/76 mmHg (12/26 0446) SpO2:  [94 %-96 %] 94 % (12/26 0446) Weight:  [204 lb 9.6 oz (92.806 kg)] 204 lb 9.6 oz (92.806 kg) (12/26 0446)  Hemodynamic parameters for last 24 hours:    Intake/Output from previous day: 12/25 0701 - 12/26 0700 In: 720 [P.O.:720] Out: 450 [Urine:450] Intake/Output this shift:    General appearance: alert, cooperative and no distress Heart: regular rate and rhythm Lungs: min dim in bases Abdomen: benign Extremities: min edema Wound: incis healing well  Lab Results:  Recent Labs  06/18/14 0525  WBC 4.7  HGB 9.6*  HCT 28.8*  PLT 97*   BMET:  Recent Labs  06/18/14 0525 06/19/14 0321  NA 134* 135  K 3.7 4.0  CL 104 103  CO2 24 25  GLUCOSE 119* 140*  BUN 31* 32*  CREATININE 1.91* 1.65*  CALCIUM 8.1* 8.3*    PT/INR: No results for input(s): LABPROT, INR in the last 72 hours. ABG    Component Value Date/Time   PHART 7.295* 06/15/2014 2013   HCO3 19.7* 06/15/2014 2013   TCO2 18 06/16/2014 1712   ACIDBASEDEF 6.0* 06/15/2014 2013   O2SAT 97.0 06/15/2014 2013   CBG (last 3)   Recent Labs  06/19/14 2125 06/20/14 0542 06/20/14 0846  GLUCAP 230* 145* 285*    Meds Scheduled Meds: . acetaminophen  1,000 mg Oral 4 times per day   Or  . acetaminophen (TYLENOL) oral liquid 160 mg/5 mL  1,000 mg Per Tube 4 times per day  . amLODipine  10 mg Oral Daily  . aspirin EC  325 mg Oral Daily   Or  . aspirin  324 mg  Per Tube Daily  . atorvastatin  40 mg Oral q1800  . bisacodyl  10 mg Oral Daily   Or  . bisacodyl  10 mg Rectal Daily  . docusate sodium  200 mg Oral Daily  . ferrous Q000111Q C-folic acid  1 capsule Oral Q breakfast  . furosemide  40 mg Oral Daily  . hydrALAZINE  10 mg Oral BID  . insulin aspart  0-24 Units Subcutaneous TID AC & HS  . insulin aspart  4 Units Subcutaneous TID WC  . insulin detemir  15 Units Subcutaneous BID  . pantoprazole  40 mg Oral Daily  . primidone  50 mg Oral QHS  . sodium chloride  3 mL Intravenous Q12H  . sodium chloride  3 mL Intravenous Q12H  . traZODone  100 mg Oral QHS   Continuous Infusions: . sodium chloride 20 mL/hr at 06/16/14 0700  . sodium chloride    . sodium chloride 20 mL/hr (06/15/14 1400)  . lactated ringers 20 mL/hr at 06/17/14 0700   PRN Meds:.sodium chloride, levalbuterol, magnesium hydroxide, ondansetron (ZOFRAN) IV, sodium chloride, sodium chloride, traMADol  Xrays No results found.  Assessment/Plan: S/P Procedure(s) (LRB): CORONARY ARTERY BYPASS GRAFTING (CABG) (N/A) INTRAOPERATIVE TRANSESOPHAGEAL  ECHOCARDIOGRAM (N/A)  1 conts to progress nicely 2 no new labs will recheck in am  3 BP remains high, will increase hydralazine to 50 TID (was on at home) 4 needs increased insulin dose as well 5 SW to work on placement  LOS: 10 days    GOLD,[name] 06/20/2014  I have seen and examined Joyce [name] and agree with the above assessment  and plan.  Grace Isaac MD Beeper [phone] Office [phone] 06/20/2014 12:49 PM

## 2014-06-21 LAB — COMPREHENSIVE METABOLIC PANEL
ALT: 26 U/L (ref 0–35)
ANION GAP: 9 (ref 5–15)
AST: 19 U/L (ref 0–37)
Albumin: 2.7 g/dL — ABNORMAL LOW (ref 3.5–5.2)
Alkaline Phosphatase: 71 U/L (ref 39–117)
BUN: 26 mg/dL — AB (ref 6–23)
CALCIUM: 9 mg/dL (ref 8.4–10.5)
CO2: 26 mmol/L (ref 19–32)
CREATININE: 1.43 mg/dL — AB (ref 0.50–1.10)
Chloride: 101 mEq/L (ref 96–112)
GFR calc non Af Amer: 36 mL/min — ABNORMAL LOW (ref >90)
GFR, EST AFRICAN AMERICAN: 42 mL/min — AB (ref >90)
Glucose, Bld: 219 mg/dL — ABNORMAL HIGH (ref 70–99)
Potassium: 4.8 mmol/L (ref 3.5–5.1)
Sodium: 136 mmol/L (ref 135–145)
TOTAL PROTEIN: 5.5 g/dL — AB (ref 6.0–8.3)
Total Bilirubin: 0.4 mg/dL (ref 0.3–1.2)

## 2014-06-21 LAB — GLUCOSE, CAPILLARY
GLUCOSE-CAPILLARY: 211 mg/dL — AB (ref 70–99)
GLUCOSE-CAPILLARY: 192 mg/dL — AB (ref 70–99)
Glucose-Capillary: 182 mg/dL — ABNORMAL HIGH (ref 70–99)
Glucose-Capillary: 163 mg/dL — ABNORMAL HIGH (ref 70–99)
Glucose-Capillary: 269 mg/dL — ABNORMAL HIGH (ref 70–99)

## 2014-06-21 MED ORDER — INSULIN DETEMIR 100 UNIT/ML ~~LOC~~ SOLN
30.0000 [IU] | Freq: Two times a day (BID) | SUBCUTANEOUS | Status: DC
  Administered 2014-06-21 – 2014-06-22 (×3): 30 [IU] via SUBCUTANEOUS
  Filled 2014-06-21 (×4): qty 0.3

## 2014-06-21 NOTE — Progress Notes (Signed)
Pulled bilateral pacing wires per order. Vs stable. Wires intact,pt tolerated procedure well. Bedrest implemented. Monitoring will continue.

## 2014-06-21 NOTE — Clinical Social Work Psychosocial (Signed)
Clinical Social Work Department BRIEF PSYCHOSOCIAL ASSESSMENT 06/21/2014  Patient:  Joyce [name], Joyce [name]     Account Number:  [id number]     Admit date:  06/10/2014  Clinical Social Worker:  Lovey Newcomer  Date/Time:  06/21/2014 01:05 PM  Referred by:  Physician  Date Referred:  06/21/2014 Referred for  SNF Placement   Other Referral:   NA   Interview type:  Patient Other interview type:   Patient alert and oriented at time of assessment.    PSYCHOSOCIAL DATA Living Status:  ALONE Admitted from facility:   Level of care:   Primary support name:  Jeneen Rinks and Lattie Haw Primary support relationship to patient:  CHILD, ADULT Degree of support available:   Support is strong.    CURRENT CONCERNS Current Concerns  Post-Acute Placement   Other Concerns:   NA    SOCIAL WORK ASSESSMENT / PLAN CSW met with patient at bedside to complete assessment. Patient appeared to be a in a good mood and welcomed CSW. Patient states that she was admitted from home where she currently lives alone. She states that her main supports are her two children who live next to her and her two siblings. Patient states that she is agreeable to placement at discharge and prefers [facility]. CSW explained SNF search/placement process to patient and answered questions. Patient plans to go to facility by EMS. CSW will assist as appropriate.   Assessment/plan status:  Psychosocial Support/Ongoing Assessment of Needs Other assessment/ plan:   Complete Fl2, Fax, PASRR   Information/referral to community resources:   CSW contact information and SNF list given.    PATIENT'S/FAMILY'S RESPONSE TO PLAN OF CARE: Patient plans to DC to SNF once medically stable. CSW will assist.       Liz Beach MSW, [place], [place], [phone]

## 2014-06-21 NOTE — Progress Notes (Signed)
Subjective:  No CP/SOB, POD # 6 CABG X4, NSTEMI , 3VD  Objective:  Temp:  [98 F (36.7 C)-98.8 F (37.1 C)] 98.8 F (37.1 C) (12/27 0938) Pulse Rate:  [89-103] 95 (12/27 1000) Resp:  [20-24] 22 (12/27 0951) BP: (151-187)/(67-78) 168/73 mmHg (12/27 1000) SpO2:  [94 %-98 %] 98 % (12/27 1000) Weight:  [202 lb 13.2 oz (92 kg)] 202 lb 13.2 oz (92 kg) (12/27 0501) Weight change: -1 lb 12.4 oz (-0.806 kg)  Intake/Output from previous day: 12/26 0701 - 12/27 0700 In: 780 [P.O.:780] Out: 3400 [Urine:3400]  Intake/Output from this shift: Total I/O In: 120 [P.O.:120] Out: 200 [Urine:200]  Physical Exam: General appearance: alert and no distress Neck: no adenopathy, no carotid bruit, no JVD, supple, symmetrical, trachea midline and thyroid not enlarged, symmetric, no tenderness/mass/nodules Lungs: clear to auscultation bilaterally Heart: regular rate and rhythm, S1, S2 normal, no murmur, click, rub or gallop Extremities: extremities normal, atraumatic, no cyanosis or edema  Lab Results: Results for orders placed or performed during the hospital encounter of 06/10/14 (from the past 48 hour(s))  Glucose, capillary     Status: Abnormal   Collection Time: 06/19/14 11:21 AM  Result Value Ref Range   Glucose-Capillary 233 (H) 70 - 99 mg/dL  Glucose, capillary     Status: Abnormal   Collection Time: 06/19/14  4:49 PM  Result Value Ref Range   Glucose-Capillary 153 (H) 70 - 99 mg/dL  Glucose, capillary     Status: Abnormal   Collection Time: 06/19/14  9:25 PM  Result Value Ref Range   Glucose-Capillary 230 (H) 70 - 99 mg/dL  Glucose, capillary     Status: Abnormal   Collection Time: 06/20/14  5:42 AM  Result Value Ref Range   Glucose-Capillary 145 (H) 70 - 99 mg/dL  Glucose, capillary     Status: Abnormal   Collection Time: 06/20/14  8:46 AM  Result Value Ref Range   Glucose-Capillary 285 (H) 70 - 99 mg/dL  Glucose, capillary     Status: Abnormal   Collection Time:  06/20/14 11:26 AM  Result Value Ref Range   Glucose-Capillary 175 (H) 70 - 99 mg/dL  Glucose, capillary     Status: Abnormal   Collection Time: 06/20/14  9:20 PM  Result Value Ref Range   Glucose-Capillary 210 (H) 70 - 99 mg/dL  Glucose, capillary     Status: Abnormal   Collection Time: 06/21/14  6:21 AM  Result Value Ref Range   Glucose-Capillary 182 (H) 70 - 99 mg/dL  Glucose, capillary     Status: Abnormal   Collection Time: 06/21/14  7:43 AM  Result Value Ref Range   Glucose-Capillary 211 (H) 70 - 99 mg/dL    Imaging: Imaging results have been reviewed  Tele: NSR   Assessment/Plan:   1. Principal Problem: 2.   NSTEMI (non-ST elevated myocardial infarction) 3. Active Problems: 4.   Chronic kidney disease (CKD), stage III (moderate) 5.   HTN (hypertension) 6.   Diabetes mellitus type 2 in obese 7.   S/P CABG x 4 8.   Time Spent Directly with Patient:  15 minutes  Length of Stay:  LOS: 11 days  POD # 6 CABG, NSTEMI. Looks great. Ambulating. BP slightly elevated . SCr decreasing 1.9----> 1.6. Was 1.4 pre op. She was on lisinopril 5 mg daily at home for HTN. Would wait to restart this until renal fxn improves. Apparently going to SNF for rehab after DC from [facility]. Nl  progression.    Baraa Tubbs J 06/21/2014, 10:30 AM

## 2014-06-21 NOTE — Progress Notes (Addendum)
[address]       [place], [address]             [phone]      6 Days Post-Op Procedure(s) (LRB): CORONARY ARTERY BYPASS GRAFTING (CABG) (N/A) INTRAOPERATIVE TRANSESOPHAGEAL ECHOCARDIOGRAM (N/A) Subjective: conts to feel ok  Objective: Vital signs in last 24 hours: Temp:  [98 F (36.7 C)-98.8 F (37.1 C)] 98.8 F (37.1 C) (12/27 0938) Pulse Rate:  [89-103] 95 (12/27 1000) Cardiac Rhythm:  [-] Normal sinus rhythm (12/27 0832) Resp:  [20-24] 22 (12/27 0951) BP: (151-187)/(67-78) 168/73 mmHg (12/27 1000) SpO2:  [94 %-98 %] 98 % (12/27 1000) Weight:  [202 lb 13.2 oz (92 kg)] 202 lb 13.2 oz (92 kg) (12/27 0501)  Hemodynamic parameters for last 24 hours:    Intake/Output from previous day: 12/26 0701 - 12/27 0700 In: 780 [P.O.:780] Out: 3400 [Urine:3400] Intake/Output this shift: Total I/O In: 120 [P.O.:120] Out: 200 [Urine:200]  General appearance: alert, cooperative and no distress Heart: regular rate and rhythm Lungs: mildly dim in the bases Abdomen: benign Extremities: no sig edema Wound: incis healing well  Lab Results: No results for input(s): WBC, HGB, HCT, PLT in the last 72 hours. BMET:   Recent Labs  06/19/14 0321  NA 135  K 4.0  CL 103  CO2 25  GLUCOSE 140*  BUN 32*  CREATININE 1.65*  CALCIUM 8.3*    PT/INR: No results for input(s): LABPROT, INR in the last 72 hours. ABG    Component Value Date/Time   PHART 7.295* 06/15/2014 2013   HCO3 19.7* 06/15/2014 2013   TCO2 18 06/16/2014 1712   ACIDBASEDEF 6.0* 06/15/2014 2013   O2SAT 97.0 06/15/2014 2013   CBG (last 3)   Recent Labs  06/21/14 0621 06/21/14 0743 06/21/14 1106  GLUCAP 182* 211* 192*    Meds Scheduled Meds: . amLODipine  10 mg Oral Daily  . aspirin EC  325 mg Oral Daily   Or  . aspirin  324 mg Per Tube Daily  . atorvastatin  40 mg Oral q1800  . bisacodyl  10 mg Oral Daily   Or  . bisacodyl  10 mg Rectal Daily  . docusate sodium  200 mg  Oral Daily  . feeding supplement (ENSURE COMPLETE)  237 mL Oral BID BM  . ferrous Q000111Q C-folic acid  1 capsule Oral Q breakfast  . furosemide  40 mg Oral Daily  . hydrALAZINE  50 mg Oral 3 times per day  . insulin aspart  0-24 Units Subcutaneous TID AC & HS  . insulin aspart  4 Units Subcutaneous TID WC  . insulin detemir  30 Units Subcutaneous BID  . pantoprazole  40 mg Oral Daily  . primidone  50 mg Oral QHS  . sodium chloride  3 mL Intravenous Q12H  . sodium chloride  3 mL Intravenous Q12H  . traZODone  100 mg Oral QHS   Continuous Infusions: . sodium chloride 20 mL/hr at 06/16/14 0700  . sodium chloride    . sodium chloride 20 mL/hr (06/15/14 1400)  . lactated ringers 20 mL/hr at 06/17/14 0700   PRN Meds:.sodium chloride, levalbuterol, magnesium hydroxide, ondansetron (ZOFRAN) IV, sodium chloride, sodium chloride, traMADol  Xrays No results found.  Assessment/Plan: S/P Procedure(s) (LRB): CORONARY ARTERY BYPASS GRAFTING (CABG) (N/A) INTRAOPERATIVE TRANSESOPHAGEAL ECHOCARDIOGRAM (N/A)  1 conts to make good progress 2 sugars still elevated, will increase insulin further 3 remains sig hypertensive- recheck creat( currently pending)- may be  able to restart ACEI or  ARB if creat ok- was on lisinopril preop 4 d/c wires 5 snf at discharge  LOS: 11 days    Joyce [name] B 06/21/2014  Plan SNF discharge in am CR pending today I have seen and examined Joyce [name] and agree with the above assessment  and plan.  Grace Isaac MD Beeper [phone] Office [phone] 06/21/2014 11:29 AM

## 2014-06-21 NOTE — Clinical Social Work Placement (Addendum)
Clinical Social Work Department CLINICAL SOCIAL WORK PLACEMENT NOTE 06/21/2014  Patient:  Joyce [name], Joyce [name]  Account Number:  [id number] Admit date:  06/10/2014  Clinical Social Worker:  Lovey Newcomer  Date/time:  06/21/2014 01:14 PM  Clinical Social Work is seeking post-discharge placement for this patient at the following level of care:   SKILLED NURSING   (*CSW will update this form in Epic as items are completed)     Patient/family provided with [place] Department of Clinical Social Work's list of facilities offering this level of care within the geographic area requested by the patient (or if unable, by the patient's family).    Patient/family informed of their freedom to choose among providers that offer the needed level of care, that participate in Medicare, Medicaid or managed care program needed by the patient, have an available bed and are willing to accept the patient.    Patient/family informed of [facility]' ownership interest in [facility], as well as of the fact that they are under no obligation to receive care at this facility.  PASARR submitted to EDS on  PASARR number received on   FL2 transmitted to all facilities in geographic area requested by pt/family on   FL2 transmitted to all facilities within larger geographic area on   Patient informed that his/her managed care company has contracts with or will negotiate with  certain facilities, including the following:     Patient/family informed of bed offers received:  06/21/2014 Patient chooses bed at [facility] rehab (Evette Cristal, MSW, [place], 06/22/14) Physician recommends and patient chooses bed at    Patient to be transferred to  on [place] rehab in [place] 06/22/14 Evette Cristal, MSW, [place], 06/22/14)  Patient to be transferred to facility by [facility] Evette Cristal, MSW, [place], 06/22/14) Patient and family notified of transfer on 06/22/14 Evette Cristal,  MSW, [place], 06/22/14) Name of family member notified: Patient said she will notify her son. Evette Cristal, MSW, [place], 06/22/14)  The following physician request were entered in Epic:   Additional Comments:    Liz Beach MSW, [place], [place], [phone]  Jones Broom Carlin, MSW, [place] 06/22/2014 4:30 PM

## 2014-06-22 LAB — GLUCOSE, CAPILLARY
GLUCOSE-CAPILLARY: 146 mg/dL — AB (ref 70–99)
GLUCOSE-CAPILLARY: 135 mg/dL — AB (ref 70–99)
GLUCOSE-CAPILLARY: 251 mg/dL — AB (ref 70–99)

## 2014-06-22 MED ORDER — INSULIN ASPART 100 UNIT/ML ~~LOC~~ SOLN: 0.0000 [IU] | Freq: Three times a day (TID) | SUBCUTANEOUS | Status: DC

## 2014-06-22 MED ORDER — HYDRALAZINE HCL 25 MG PO TABS: 75.0000 mg | ORAL_TABLET | Freq: Three times a day (TID) | ORAL | Status: DC

## 2014-06-22 MED ORDER — ENSURE COMPLETE PO LIQD: 237.0000 mL | Freq: Two times a day (BID) | ORAL | Status: DC

## 2014-06-22 MED ORDER — FUROSEMIDE 40 MG PO TABS: 40.0000 mg | ORAL_TABLET | Freq: Every day | ORAL | Status: DC

## 2014-06-22 MED ORDER — FE FUMARATE-B12-VIT C-FA-IFC PO CAPS: 1.0000 | ORAL_CAPSULE | Freq: Every day | ORAL | Status: DC

## 2014-06-22 MED ORDER — TRAMADOL HCL 50 MG PO TABS: 50.0000 mg | ORAL_TABLET | Freq: Four times a day (QID) | ORAL | Status: DC | PRN

## 2014-06-22 MED ORDER — ATORVASTATIN CALCIUM 40 MG PO TABS: 40.0000 mg | ORAL_TABLET | Freq: Every day | ORAL | Status: DC

## 2014-06-22 MED ORDER — ASPIRIN 325 MG PO TBEC: 325.0000 mg | DELAYED_RELEASE_TABLET | Freq: Every day | ORAL | Status: DC

## 2014-06-22 MED ORDER — HYDRALAZINE HCL 25 MG PO TABS
75.0000 mg | ORAL_TABLET | Freq: Three times a day (TID) | ORAL | Status: DC
  Administered 2014-06-22: 75 mg via ORAL
  Filled 2014-06-22 (×3): qty 1

## 2014-06-22 NOTE — Progress Notes (Signed)
Nursing note Patient will be DCd to snf, report called to facility. Packet will go with patient, transported via ems. Itzamar Traynor, Bettina Gavia RN

## 2014-06-22 NOTE — Progress Notes (Addendum)
      [address]       [place] [address]             [phone]        7 Days Post-Op Procedure(s) (LRB): CORONARY ARTERY BYPASS GRAFTING (CABG) (N/A) INTRAOPERATIVE TRANSESOPHAGEAL ECHOCARDIOGRAM (N/A)  Subjective: Patient washing up this am. Wants to go to SNF today.  Objective: Vital signs in last 24 hours: Temp:  [98.4 F (36.9 C)-98.8 F (37.1 C)] 98.5 F (36.9 C) (12/28 0418) Pulse Rate:  [92-101] 92 (12/28 0418) Cardiac Rhythm:  [-] Normal sinus rhythm (12/27 1935) Resp:  [20-24] 24 (12/28 0418) BP: (142-179)/(57-76) 151/57 mmHg (12/28 0418) SpO2:  [92 %-98 %] 92 % (12/28 0418) Weight:  [204 lb 2.3 oz (92.6 kg)] 204 lb 2.3 oz (92.6 kg) (12/28 0418)  Pre op weight 87 kg Current Weight  06/22/14 204 lb 2.3 oz (92.6 kg)      Intake/Output from previous day: 12/27 0701 - 12/28 0700 In: 600 [P.O.:600] Out: 1700 [Urine:1700]   Physical Exam:  Cardiovascular: RRR, no murmurs, gallops, or rubs. Pulmonary: Diminished at bases bilaterally; no rales, wheezes, or rhonchi. Abdomen: Soft, non tender, bowel sounds present. Extremities: Mild bilateral lower extremity edema. Wounds: Clean and dry.  No erythema or signs of infection.  Lab Results: CBC:No results for input(s): WBC, HGB, HCT, PLT in the last 72 hours. BMET:  Recent Labs  06/21/14 1050  NA 136  K 4.8  CL 101  CO2 26  GLUCOSE 219*  BUN 26*  CREATININE 1.43*  CALCIUM 9.0    PT/INR:  Lab Results  Component Value Date   INR 1.21 06/15/2014   INR 1.04 06/13/2014   INR 1.00 07/20/2010   ABG:  INR: Will add last result for INR, ABG once components are confirmed Will add last 4 CBG results once components are confirmed  Assessment/Plan:  1. CV - S/p NSTEMI. SR in the 90's this am. Still hypertensive. On Norvasc 10 daily, Hydralazine 50 tid. Will  increase Hydralazine to 75 bid. 2.  Pulmonary - Encourage incentive spirometer 3. Volume Overload - On Lasix 40 daily 4.   Acute blood loss anemia - On Trinsicon 5. DM-CBGs 163/269/135. On Insulin. Not on Metformin as taken pre op secondary to elevated creatinine.Pre op HGA1C 5.9. 6. Creatinine yesterday 1.43. This is decreased from previously.  7. To SNF, likely later today  ZIMMERMAN,DONIELLE MPA-C 06/22/2014,7:40 AM   Chart reviewed, patient examined, agree with above. Can resume previous diabetes regimen at discharge. Her Hgb A1c was well-controlled preop.

## 2014-06-22 NOTE — Progress Notes (Signed)
Pt preparing for d/c and declined ambulation. Reviewed diet and gave suggestions on where to find recipes. Reviewed ex and daily routine. Voiced understanding. [place], ACSM 2:01 PM 06/22/2014

## 2014-06-22 NOTE — Progress Notes (Signed)
Physical Therapy Treatment Patient Details Name: Joyce [name] MRN: [id number] DOB: [birth year] Today's Date: 06/22/2014    History of Present Illness 73 y.o. female s/p CORONARY ARTERY BYPASS GRAFTING (CABG)     PT Comments    Pt very motivated and pleasant during therapy session. Pt requiring multiple standing rest breaks due to fatigue. See general comments of O2 sats. Pt hopes to D/C to SNF today.   Follow Up Recommendations  SNF     Equipment Recommendations  3in1 (PT)    Recommendations for Other Services       Precautions / Restrictions Precautions Precautions: Sternal Precaution Comments: Reviewed precautions Restrictions Weight Bearing Restrictions: Yes Other Position/Activity Restrictions: sternal precautions.    Mobility  Bed Mobility               General bed mobility comments: up in chair and returned to chair  Transfers Overall transfer level: Needs assistance Equipment used: Rolling walker (2 wheeled) Transfers: Sit to/from Stand Sit to Stand: Min guard         General transfer comment: cues for sternal precautions and min guard to control descent to chair  Ambulation/Gait Ambulation/Gait assistance: Min guard Ambulation Distance (Feet): 125 Feet Assistive device: Rolling walker (2 wheeled) Gait Pattern/deviations: Step-through pattern;Drifts right/left Gait velocity: decreased Gait velocity interpretation: Below normal speed for age/gender General Gait Details: pt required 2 standing rest breaks due to fatigue and SOB; pt ambulating on RA ant maintaining O2 sats of 88%; cues for upright posture; pursed lip breathing and RW management   Stairs            Wheelchair Mobility    Modified Rankin (Stroke Patients Only)       Balance Overall balance assessment: Needs assistance Sitting-balance support: Feet supported;No upper extremity supported Sitting balance-Leahy Scale: Good     Standing balance support: During  functional activity;No upper extremity supported Standing balance-Leahy Scale: Fair Standing balance comment: stood for short bout without UE support                    Cognition Arousal/Alertness: Awake/alert Behavior During Therapy: WFL for tasks assessed/performed Overall Cognitive Status: Within Functional Limits for tasks assessed                      Exercises      General Comments General comments (skin integrity, edema, etc.): at rest O2 prior to ambulation 89% on RA; during ambulation on RA at 88%; at rest after ambulation on RA 90%      Pertinent Vitals/Pain Pain Assessment: 0-10 Pain Score: 4  Pain Location: sternal area and abdomen region Pain Descriptors / Indicators: Sore Pain Intervention(s): Monitored during session;Premedicated before session;Repositioned    Home Living                      Prior Function            PT Goals (current goals can now be found in the care plan section) Acute Rehab PT Goals Patient Stated Goal: to go to rehab today PT Goal Formulation: With patient Time For Goal Achievement: 07/01/14 Potential to Achieve Goals: Good Progress towards PT goals: Progressing toward goals    Frequency  Min 3X/week    PT Plan Current plan remains appropriate    Co-evaluation             End of Session Equipment Utilized During Treatment: Gait belt;Oxygen Activity Tolerance: Patient tolerated treatment well  Patient left: with call bell/phone within reach;with family/visitor present;in chair     Time: [id number] PT Time Calculation (min) (ACUTE ONLY): 14 min  Charges:  $Gait Training: 8-22 mins                    G Codes:      Elie Confer [place] , [place]  06/22/2014, 9:44 AM

## 2014-06-22 NOTE — Clinical Social Work Note (Signed)
Patient to be d/c'ed today to [facility] rehab in [place], [place].  Patient and family agreeable to plans will transport via ems RN to call report.  Evette Cristal, MSW, [place]

## 2014-06-22 NOTE — Discharge Instructions (Signed)
We ask that the SNF please do the following:  1. Please obtain vital signs at least one time daily 2.Please weigh the patient daily. If he or she continues to gain weight or develops lower extremity edema, contact the office at [phone]. 3. Ambulate patient at least three times daily and please use sternal precautions.  Activity: 1.May walk up steps                2.No lifting more than ten pounds for four weeks.                 3.No driving for four weeks.                4.Stop any activity that causes chest pain, shortness of breath, dizziness, sweating or excessive weakness.                5.Avoid straining.                6.Continue with your breathing exercises daily.  Diet: Diabetic diet and Low fat, Low salt diet  Wound Care: May shower.  Clean wounds with mild soap and water daily. Contact the office at [phone] if any problems arise.  Coronary Artery Bypass Grafting, Care After Refer to this sheet in the next few weeks. These instructions provide you with information on caring for yourself after your procedure. Your health care provider may also give you more specific instructions. Your treatment has been planned according to current medical practices, but problems sometimes occur. Call your health care provider if you have any problems or questions after your procedure. WHAT TO EXPECT AFTER THE PROCEDURE Recovery from surgery will be different for everyone. Some people feel well after 3 or 4 weeks, while for others it takes longer. After your procedure, it is typical to have the following:  Nausea and a lack of appetite.   Constipation.  Weakness and fatigue.   Depression or irritability.   Pain or discomfort at your incision site. HOME CARE INSTRUCTIONS  Take medicines only as directed by your health care provider. Do not stop taking medicines or start any new medicines without first checking with your health care provider.  Take your pulse as directed by  your health care provider.  Perform deep breathing as directed by your health care provider. If you were given a device called an incentive spirometer, use it to practice deep breathing several times a day. Support your chest with a pillow or your arms when you take deep breaths or cough.  Keep incision areas clean, dry, and protected. Remove or change any bandages (dressings) only as directed by your health care provider. You may have skin adhesive strips over the incision areas. Do not take the strips off. They will fall off on their own.  Check incision areas daily for any swelling, redness, or drainage.  If incisions were made in your legs, do the following:  Avoid crossing your legs.   Avoid sitting for long periods of time. Change positions every 30 minutes.   Elevate your legs when you are sitting.  Wear compression stockings as directed by your health care provider. These stockings help keep blood clots from forming in your legs.  Take showers once your health care provider approves. Until then, only take sponge baths. Pat incisions dry. Do not rub incisions with a washcloth or towel. Do not take baths, swim, or use a hot tub until your health care provider approves.  Eat foods that  are high in fiber, such as raw fruits and vegetables, whole grains, beans, and nuts. Meats should be lean cut. Avoid canned, processed, and fried foods.  Drink enough fluid to keep your urine clear or pale yellow.  Weigh yourself every day. This helps identify if you are retaining fluid that may make your heart and lungs work harder.  Rest and limit activity as directed by your health care provider. You may be instructed to:  Stop any activity at once if you have chest pain, shortness of breath, irregular heartbeats, or dizziness. Get help right away if you have any of these symptoms.  Move around frequently for short periods or take short walks as directed by your health care provider. Increase  your activities gradually. You may need physical therapy or cardiac rehabilitation to help strengthen your muscles and build your endurance.  Avoid lifting, pushing, or pulling anything heavier than 10 lb (4.5 kg) for at least 6 weeks after surgery.  Do not drive until your health care provider approves.  Ask your health care provider when you may return to work.  Ask your health care provider when you may resume sexual activity.  Keep all follow-up visits as directed by your health care provider. This is important. SEEK MEDICAL CARE IF:  You have swelling, redness, increasing pain, or drainage at the site of an incision.  You have a fever.  You have swelling in your ankles or legs.  You have pain in your legs.   You gain 2 or more pounds (0.9 kg) a day.  You are nauseous or vomit.  You have diarrhea. SEEK IMMEDIATE MEDICAL CARE IF:  You have chest pain that goes to your jaw or arms.  You have shortness of breath.   You have a fast or irregular heartbeat.   You notice a "clicking" in your breastbone (sternum) when you move.   You have numbness or weakness in your arms or legs.  You feel dizzy or light-headed.  MAKE SURE YOU:  Understand these instructions.  Will watch your condition.  Will get help right away if you are not doing well or get worse. Document Released: 12/30/2004 Document Revised: 10/27/2013 Document Reviewed: 11/19/2012 [facility] Patient Information 2015 [place], [place]. This information is not intended to replace advice given to you by your health care provider. Make sure you discuss any questions you have with your health care provider.

## 2014-06-22 NOTE — Progress Notes (Signed)
Nursing note Pt CT sutures removed as ordered and per protocol steri strips applied. Davelle Anselmi, Bettina Gavia RN

## 2014-07-13 ENCOUNTER — Ambulatory Visit: Payer: Medicare Other | Admitting: Physician Assistant

## 2014-07-15 ENCOUNTER — Encounter: Payer: Self-pay | Admitting: Cardiothoracic Surgery

## 2014-07-15 ENCOUNTER — Ambulatory Visit (INDEPENDENT_AMBULATORY_CARE_PROVIDER_SITE_OTHER): Payer: Self-pay | Admitting: Cardiothoracic Surgery

## 2014-07-15 ENCOUNTER — Ambulatory Visit: Payer: Medicare Other | Admitting: Cardiothoracic Surgery

## 2014-07-15 ENCOUNTER — Other Ambulatory Visit: Payer: Self-pay | Admitting: Cardiothoracic Surgery

## 2014-07-15 ENCOUNTER — Ambulatory Visit
Admission: RE | Admit: 2014-07-15 | Discharge: 2014-07-15 | Disposition: A | Discharge status: Home / Self Care | Payer: Medicare Other | Source: Ambulatory Visit | Attending: Cardiothoracic Surgery | Admitting: Cardiothoracic Surgery

## 2014-07-15 VITALS — BP 140/77 | HR 96 | Resp 20 | Ht 65.0 in | Wt 193.0 lb

## 2014-07-15 DIAGNOSIS — Z951 Presence of aortocoronary bypass graft: Secondary | ICD-10-CM

## 2014-07-16 NOTE — Progress Notes (Signed)
PCP is No primary care provider on file. Referring Provider is No ref. provider found  Chief Complaint  Patient presents with  . 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 Chronic kidney disease   . Diabetes mellitus without complication   . Hypertension   . Anginal pain     Past Surgical History  Procedure Laterality Date  . Cholecystectomy    . Abdominal hysterectomy    . Tonsillectomy    . Left heart catheterization with coronary angiogram N/A 06/11/2014    Procedure: LEFT HEART CATHETERIZATION WITH CORONARY ANGIOGRAM;  Surgeon: Blane Ohara, MD;  [facility] C[facility] LAB;  Service: Cardiovascular;  Laterality: N/A;  . Coronary artery bypass graft N/A 06/15/2014    Procedure: CORONARY ARTERY BYPASS GRAFTING (CABG);  Surgeon: Ivin Poot, MD;  Location: [place];  Service: Open Heart Surgery;  Laterality: N/A;  . Intraoperative transesophageal echocardiogram N/A 06/15/2014    Procedure: INTRAOPERATIVE TRANSESOPHAGEAL ECHOCARDIOGRAM;  Surgeon: Ivin Poot, MD;  Location: [place];  Service: Open Heart Surgery;  Laterality: N/A;    No family history on file.  Social History History  Substance Use Topics  . Smoking status: Never Smoker   . Smokeless tobacco: Not on file  . Alcohol Use: Not on file    Current Outpatient Prescriptions  Medication Sig Dispense Refill  . amLODipine (NORVASC) 10 MG tablet Take 10 mg by mouth daily.   5  . aspirin EC 325 MG EC tablet Take 1 tablet (325 mg total) by mouth daily. 30 tablet 0  . atorvastatin (LIPITOR) 40 MG tablet Take 1 tablet (40 mg total) by mouth daily at 6 PM.    . ferrous Q000111Q  C-folic acid (TRINSICON / FOLTRIN) capsule Take 1 capsule by mouth daily with breakfast. For one month then stop.    . furosemide (LASIX) 40 MG tablet Take 40 mg by mouth daily.    Marland Kitchen HUMALOG KWIKPEN 100 UNIT/ML KiwkPen Inject 8 Units into the skin 3 (three) times daily with meals.   1  . hydrALAZINE (APRESOLINE) 25 MG tablet Take 3 tablets (75 mg total) by mouth every 8 (eight) hours. (Patient taking differently: Take 75 mg by mouth 3 (three) times daily. )    . insulin aspart (NOVOLOG) 100 UNIT/ML injection Inject 0-24 Units into the skin 4 (four) times daily -  before meals and at bedtime. Units of Insulin If CBG,120=0     251-300=12 121-160=2         301-350=16 161-200=4         351-450=20 201-250=8         >450=24 and call MD 10 mL 11  . primidone (MYSOLINE) 50 MG tablet Take 50 mg by mouth daily.   5  . traMADol (ULTRAM) 50 MG tablet Take 1 tablet (50 mg total) by mouth every 6 (six) hours as needed for moderate pain. 30 tablet 0  . traZODone (DESYREL) 100 MG tablet Take 100 mg by mouth at bedtime.   0   No current facility-administered medications for this visit.    Allergies  Allergen Reactions  . Carvedilol Other (See Comments)  confusion  . Codeine Other (See Comments)    confusion  . Metoprolol Palpitations    Review of Systems  \no fever appetitie ok Set to return home next week  BP 140/77 mmHg  Pulse 96  Resp 20  Ht 5\' 5"  (1.651 m)  Wt 193 lb (87.544 kg)  BMI 32.12 kg/m2  SpO2 93% Physical Exam Alert , comfortable Lung breath sounds decreased Heart rate regular Sternal incision well-healed Mild pedal edema Neuro intact  Diagnostic Tests: cxr with bilat effusions  Impression: Agree she can transition home next week  Plan: RTC in 4 weeks with CXR to follow effusions

## 2014-07-23 ENCOUNTER — Other Ambulatory Visit: Payer: Self-pay

## 2014-07-23 DIAGNOSIS — R0600 Dyspnea, unspecified: Secondary | ICD-10-CM

## 2014-07-26 ENCOUNTER — Emergency Department (HOSPITAL_COMMUNITY): Payer: Medicare Other

## 2014-07-26 ENCOUNTER — Encounter (HOSPITAL_COMMUNITY): Payer: Self-pay | Admitting: Emergency Medicine

## 2014-07-26 ENCOUNTER — Inpatient Hospital Stay (HOSPITAL_COMMUNITY)
Admission: EM | Admit: 2014-07-26 | Discharge: 2014-07-31 | DRG: 187 | Disposition: A | Discharge status: Home Health | Payer: Medicare Other | Attending: Internal Medicine | Admitting: Internal Medicine

## 2014-07-26 DIAGNOSIS — Z794 Long term (current) use of insulin: Secondary | ICD-10-CM

## 2014-07-26 DIAGNOSIS — E669 Obesity, unspecified: Secondary | ICD-10-CM

## 2014-07-26 DIAGNOSIS — K5900 Constipation, unspecified: Secondary | ICD-10-CM | POA: Diagnosis present

## 2014-07-26 DIAGNOSIS — Z951 Presence of aortocoronary bypass graft: Secondary | ICD-10-CM

## 2014-07-26 DIAGNOSIS — D649 Anemia, unspecified: Secondary | ICD-10-CM | POA: Diagnosis present

## 2014-07-26 DIAGNOSIS — I5032 Chronic diastolic (congestive) heart failure: Secondary | ICD-10-CM | POA: Diagnosis present

## 2014-07-26 DIAGNOSIS — M797 Fibromyalgia: Secondary | ICD-10-CM | POA: Diagnosis present

## 2014-07-26 DIAGNOSIS — R0602 Shortness of breath: Secondary | ICD-10-CM | POA: Diagnosis present

## 2014-07-26 DIAGNOSIS — T464X5A Adverse effect of angiotensin-converting-enzyme inhibitors, initial encounter: Secondary | ICD-10-CM | POA: Diagnosis not present

## 2014-07-26 DIAGNOSIS — N183 Chronic kidney disease, stage 3 (moderate): Secondary | ICD-10-CM | POA: Diagnosis present

## 2014-07-26 DIAGNOSIS — I252 Old myocardial infarction: Secondary | ICD-10-CM

## 2014-07-26 DIAGNOSIS — E119 Type 2 diabetes mellitus without complications: Secondary | ICD-10-CM | POA: Diagnosis present

## 2014-07-26 DIAGNOSIS — Z6832 Body mass index (BMI) 32.0-32.9, adult: Secondary | ICD-10-CM

## 2014-07-26 DIAGNOSIS — I10 Essential (primary) hypertension: Secondary | ICD-10-CM | POA: Diagnosis present

## 2014-07-26 DIAGNOSIS — Z7982 Long term (current) use of aspirin: Secondary | ICD-10-CM | POA: Diagnosis not present

## 2014-07-26 DIAGNOSIS — J90 Pleural effusion, not elsewhere classified: Secondary | ICD-10-CM | POA: Diagnosis present

## 2014-07-26 DIAGNOSIS — I129 Hypertensive chronic kidney disease with stage 1 through stage 4 chronic kidney disease, or unspecified chronic kidney disease: Secondary | ICD-10-CM | POA: Diagnosis present

## 2014-07-26 DIAGNOSIS — D72819 Decreased white blood cell count, unspecified: Secondary | ICD-10-CM | POA: Diagnosis present

## 2014-07-26 DIAGNOSIS — Z79899 Other long term (current) drug therapy: Secondary | ICD-10-CM | POA: Diagnosis not present

## 2014-07-26 DIAGNOSIS — I25119 Atherosclerotic heart disease of native coronary artery with unspecified angina pectoris: Secondary | ICD-10-CM | POA: Diagnosis present

## 2014-07-26 DIAGNOSIS — N185 Chronic kidney disease, stage 5: Secondary | ICD-10-CM | POA: Diagnosis present

## 2014-07-26 DIAGNOSIS — N179 Acute kidney failure, unspecified: Secondary | ICD-10-CM | POA: Diagnosis not present

## 2014-07-26 DIAGNOSIS — E1169 Type 2 diabetes mellitus with other specified complication: Secondary | ICD-10-CM

## 2014-07-26 DIAGNOSIS — J9811 Atelectasis: Secondary | ICD-10-CM | POA: Diagnosis not present

## 2014-07-26 DIAGNOSIS — Z9889 Other specified postprocedural states: Secondary | ICD-10-CM

## 2014-07-26 DIAGNOSIS — R0600 Dyspnea, unspecified: Secondary | ICD-10-CM | POA: Insufficient documentation

## 2014-07-26 LAB — CBC WITH DIFFERENTIAL/PLATELET
Basophils Absolute: 0 10*3/uL (ref 0.0–0.1)
Basophils Relative: 0 % (ref 0–1)
Eosinophils Absolute: 0 10*3/uL (ref 0.0–0.7)
Eosinophils Relative: 1 % (ref 0–5)
HCT: 30.3 % — ABNORMAL LOW (ref 36.0–46.0)
Hemoglobin: 9.9 g/dL — ABNORMAL LOW (ref 12.0–15.0)
Lymphocytes Relative: 25 % (ref 12–46)
Lymphs Abs: 1 10*3/uL (ref 0.7–4.0)
MCH: 27.7 pg (ref 26.0–34.0)
MCHC: 32.7 g/dL (ref 30.0–36.0)
MCV: 84.6 fL (ref 78.0–100.0)
Monocytes Absolute: 0.3 10*3/uL (ref 0.1–1.0)
Monocytes Relative: 9 % (ref 3–12)
Neutro Abs: 2.6 10*3/uL (ref 1.7–7.7)
Neutrophils Relative %: 65 % (ref 43–77)
Platelets: 190 10*3/uL (ref 150–400)
RBC: 3.58 MIL/uL — ABNORMAL LOW (ref 3.87–5.11)
RDW: 14.2 % (ref 11.5–15.5)
WBC: 4 10*3/uL (ref 4.0–10.5)

## 2014-07-26 LAB — BASIC METABOLIC PANEL
Anion gap: 8 (ref 5–15)
BUN: 27 mg/dL — ABNORMAL HIGH (ref 6–23)
CO2: 25 mmol/L (ref 19–32)
Calcium: 8.9 mg/dL (ref 8.4–10.5)
Chloride: 101 mmol/L (ref 96–112)
Creatinine, Ser: 1.5 mg/dL — ABNORMAL HIGH (ref 0.50–1.10)
GFR calc Af Amer: 39 mL/min — ABNORMAL LOW (ref >90)
GFR calc non Af Amer: 34 mL/min — ABNORMAL LOW (ref >90)
Glucose, Bld: 108 mg/dL — ABNORMAL HIGH (ref 70–99)
Potassium: 4 mmol/L (ref 3.5–5.1)
Sodium: 134 mmol/L — ABNORMAL LOW (ref 135–145)

## 2014-07-26 LAB — CBG MONITORING, ED: Glucose-Capillary: 112 mg/dL — ABNORMAL HIGH (ref 70–99)

## 2014-07-26 LAB — BRAIN NATRIURETIC PEPTIDE: B Natriuretic Peptide: 97 pg/mL (ref 0.0–100.0)

## 2014-07-26 LAB — I-STAT TROPONIN, ED: Troponin i, poc: 0.01 ng/mL (ref 0.00–0.08)

## 2014-07-26 LAB — GLUCOSE, CAPILLARY
GLUCOSE-CAPILLARY: 167 mg/dL — AB (ref 70–99)
Glucose-Capillary: 149 mg/dL — ABNORMAL HIGH (ref 70–99)

## 2014-07-26 MED ORDER — ASPIRIN EC 325 MG PO TBEC
325.0000 mg | DELAYED_RELEASE_TABLET | Freq: Every day | ORAL | Status: DC
  Administered 2014-07-27 – 2014-07-31 (×5): 325 mg via ORAL
  Filled 2014-07-26 (×5): qty 1

## 2014-07-26 MED ORDER — FE FUMARATE-B12-VIT C-FA-IFC PO CAPS
1.0000 | ORAL_CAPSULE | Freq: Every day | ORAL | Status: DC
  Administered 2014-07-27 – 2014-07-31 (×5): 1 via ORAL
  Filled 2014-07-26 (×7): qty 1

## 2014-07-26 MED ORDER — PRIMIDONE 50 MG PO TABS
50.0000 mg | ORAL_TABLET | Freq: Every day | ORAL | Status: DC
  Administered 2014-07-27 – 2014-07-31 (×5): 50 mg via ORAL
  Filled 2014-07-26 (×5): qty 1

## 2014-07-26 MED ORDER — IOHEXOL 350 MG/ML SOLN
80.0000 mL | Freq: Once | INTRAVENOUS | Status: AC | PRN
  Administered 2014-07-26: 80 mL via INTRAVENOUS

## 2014-07-26 MED ORDER — TRAZODONE HCL 100 MG PO TABS
100.0000 mg | ORAL_TABLET | Freq: Every day | ORAL | Status: DC
  Administered 2014-07-26 – 2014-07-30 (×5): 100 mg via ORAL
  Filled 2014-07-26 (×6): qty 1

## 2014-07-26 MED ORDER — GLUCERNA PO LIQD: 237.0000 mL | Freq: Every day | ORAL | Status: DC

## 2014-07-26 MED ORDER — AMLODIPINE BESYLATE 10 MG PO TABS
10.0000 mg | ORAL_TABLET | Freq: Every day | ORAL | Status: DC
  Administered 2014-07-27 – 2014-07-31 (×5): 10 mg via ORAL
  Filled 2014-07-26 (×5): qty 1

## 2014-07-26 MED ORDER — GLUCERNA SHAKE PO LIQD
237.0000 mL | Freq: Every day | ORAL | Status: DC
  Administered 2014-07-27 – 2014-07-31 (×5): 237 mL via ORAL

## 2014-07-26 MED ORDER — FUROSEMIDE 10 MG/ML IJ SOLN
80.0000 mg | Freq: Once | INTRAMUSCULAR | Status: AC
  Administered 2014-07-27: 80 mg via INTRAVENOUS
  Filled 2014-07-26 (×2): qty 8

## 2014-07-26 MED ORDER — HYDRALAZINE HCL 50 MG PO TABS
75.0000 mg | ORAL_TABLET | Freq: Three times a day (TID) | ORAL | Status: DC
  Administered 2014-07-26 – 2014-07-31 (×13): 75 mg via ORAL
  Filled 2014-07-26 (×17): qty 1

## 2014-07-26 MED ORDER — HEPARIN SODIUM (PORCINE) 5000 UNIT/ML IJ SOLN
5000.0000 [IU] | Freq: Three times a day (TID) | INTRAMUSCULAR | Status: DC
  Administered 2014-07-26 – 2014-07-31 (×11): 5000 [IU] via SUBCUTANEOUS
  Filled 2014-07-26 (×16): qty 1

## 2014-07-26 MED ORDER — INSULIN ASPART 100 UNIT/ML ~~LOC~~ SOLN
0.0000 [IU] | Freq: Three times a day (TID) | SUBCUTANEOUS | Status: DC
  Administered 2014-07-27: 3 [IU] via SUBCUTANEOUS
  Administered 2014-07-28 – 2014-07-29 (×4): 2 [IU] via SUBCUTANEOUS
  Administered 2014-07-30: 3 [IU] via SUBCUTANEOUS
  Administered 2014-07-31 (×2): 2 [IU] via SUBCUTANEOUS

## 2014-07-26 MED ORDER — TRAMADOL HCL 50 MG PO TABS
50.0000 mg | ORAL_TABLET | Freq: Once | ORAL | Status: AC
Start: 2014-07-26 — End: 2014-07-26
  Administered 2014-07-26: 50 mg via ORAL
  Filled 2014-07-26: qty 1

## 2014-07-26 MED ORDER — MENTHOL-ZINC OXIDE 0.44-20.625 % EX OINT: 1.0000 "application " | TOPICAL_OINTMENT | Freq: Three times a day (TID) | CUTANEOUS | Status: DC

## 2014-07-26 MED ORDER — TRAMADOL HCL 50 MG PO TABS
50.0000 mg | ORAL_TABLET | Freq: Four times a day (QID) | ORAL | Status: DC | PRN
  Administered 2014-07-26 – 2014-07-31 (×11): 50 mg via ORAL
  Filled 2014-07-26 (×12): qty 1

## 2014-07-26 MED ORDER — FUROSEMIDE 10 MG/ML IJ SOLN
40.0000 mg | Freq: Once | INTRAMUSCULAR | Status: AC
  Administered 2014-07-26: 40 mg via INTRAVENOUS
  Filled 2014-07-26: qty 4

## 2014-07-26 MED ORDER — BISACODYL 10 MG RE SUPP
10.0000 mg | Freq: Once | RECTAL | Status: AC
  Administered 2014-07-26: 10 mg via RECTAL
  Filled 2014-07-26: qty 1

## 2014-07-26 MED ORDER — INSULIN GLARGINE 100 UNIT/ML ~~LOC~~ SOLN
10.0000 [IU] | Freq: Every day | SUBCUTANEOUS | Status: DC
  Administered 2014-07-26 – 2014-07-30 (×4): 10 [IU] via SUBCUTANEOUS
  Filled 2014-07-26 (×6): qty 0.1

## 2014-07-26 MED ORDER — ACETAMINOPHEN 325 MG PO TABS
650.0000 mg | ORAL_TABLET | Freq: Four times a day (QID) | ORAL | Status: DC | PRN
  Administered 2014-07-27: 650 mg via ORAL
  Filled 2014-07-26 (×2): qty 2

## 2014-07-26 MED ORDER — ATORVASTATIN CALCIUM 40 MG PO TABS
40.0000 mg | ORAL_TABLET | Freq: Every day | ORAL | Status: DC
  Administered 2014-07-27 – 2014-07-30 (×4): 40 mg via ORAL
  Filled 2014-07-26 (×5): qty 1

## 2014-07-26 MED ORDER — ZINC OXIDE 20 % EX OINT
TOPICAL_OINTMENT | Freq: Three times a day (TID) | CUTANEOUS | Status: DC
  Administered 2014-07-26 – 2014-07-27 (×2): 1 via TOPICAL
  Administered 2014-07-27 – 2014-07-31 (×11): via TOPICAL
  Filled 2014-07-26: qty 28.35

## 2014-07-26 NOTE — H&P (Signed)
Date: 07/26/2014               Patient Name:  Joyce [name] MRN: [id number]  DOB: September 06, 1940 Age / Sex: 74 y.o., female   PCP: No primary care provider on file.         Medical Service: Internal Medicine Teaching Service         Attending Physician: Dr. Karren Cobble, MD    First Contact: Primus Bravo, MS3 Dr. Karle Starch Moding Pager: [phone]  Second Contact: Dr. Juluis Mire Pager: [phone]       After Hours (After 5p/  First Contact Pager: [phone]  weekends / holidays): Second Contact Pager: [phone]   Chief Complaint: shortness of breath  History of Present Illness: Joyce [name] is a 74yo female with a PMHx of CKD, T2DM, HTN, fibromyalgia, and anginal pain s/p CABG x4 (06/15/14 by Dr. Nils Pyle at [facility]) who presents with 2 weeks of shortness of breath. She presented to her PCP on 1/21 for routine post-op follow-up. Per clinic note, she reported dyspnea on exertion and CXR showed small bilateral pleural effusions. She was given 4 days of metolazone 5mg  in addition to preexisting 40mg  lasix daily. Since then, the SOB has persisted. Pt has been unable to participate in rehab activities for the past 4 days. SOB occurs with activity and rest; she also reports orthopnea. She has recently been using 2L O2 at home. She also reports dry, hacking, nonproductive cough; fatigue; back pain; pedal edema; and constipation. She reports that blood sugars have been running 80-150.  In the ED, O2 was increased to 4L; O2 sat 99%. Cardiac workup was initiated. EKG showed no significant change from prior tracing. CXR showed increase in bilateral pleural effusions and lung base atelectasis; no pulmonary edema. CT angio of the chest showed large bilateral pleural effusions with atelectasis, mild dilatation of ascending thoracic aorta measuring 3.8cm. CBC, BMP, Troponin, BNP, blood glucose labs collected.  Meds:  No current facility-administered medications on file prior to encounter.    Current Outpatient Prescriptions on File Prior to Encounter  Medication Sig Dispense Refill  . amLODipine (NORVASC) 10 MG tablet Take 10 mg by mouth daily.   5  . aspirin EC 325 MG EC tablet Take 1 tablet (325 mg total) by mouth daily. 30 tablet 0  . atorvastatin (LIPITOR) 40 MG tablet Take 1 tablet (40 mg total) by mouth daily at 6 PM.    . ferrous Q000111Q C-folic acid (TRINSICON / FOLTRIN) capsule Take 1 capsule by mouth daily with breakfast. For one month then stop. (Patient taking differently: Take 1 capsule by mouth daily with breakfast. )    . furosemide (LASIX) 40 MG tablet Take 40 mg by mouth daily.    . hydrALAZINE (APRESOLINE) 25 MG tablet Take 3 tablets (75 mg total) by mouth every 8 (eight) hours. (Patient taking differently: Take 75 mg by mouth 3 (three) times daily. 9am, 2pm, 9pm)    . primidone (MYSOLINE) 50 MG tablet Take 50 mg by mouth daily.   5  . traMADol (ULTRAM) 50 MG tablet Take 1 tablet (50 mg total) by mouth every 6 (six) hours as needed for moderate pain. (Patient taking differently: Take 50 mg by mouth every 4 (four) hours as needed for moderate pain or severe pain. ) 30 tablet 0  . traZODone (DESYREL) 100 MG tablet Take 100 mg by mouth at bedtime.   0  . insulin aspart (NOVOLOG) 100 UNIT/ML injection Inject 0-24 Units  into the skin 4 (four) times daily -  before meals and at bedtime. Units of Insulin If CBG,120=0     251-300=12 121-160=2         301-350=16 161-200=4         351-450=20 201-250=8         >450=24 and call MD (Patient not taking: Reported on 07/26/2014) 10 mL 11    Allergies: Allergies as of 07/26/2014 - Review Complete 07/26/2014  Allergen Reaction Noted  . Carvedilol Other (See Comments) 06/11/2014  . Codeine Other (See Comments) 06/11/2014  . Metoprolol Palpitations 06/11/2014   Past Medical History  Diagnosis Date  . Chronic kidney disease   . Diabetes mellitus without complication   . Hypertension   . Anginal pain   . Acute  on chronic diastolic heart failure [id number]  . Pleural effusion 07/26/2014    Post-operative - bilateral    Past Surgical History  Procedure Laterality Date  . Cholecystectomy    . Abdominal hysterectomy    . Tonsillectomy    . Left heart catheterization with coronary angiogram N/A 06/11/2014    Procedure: LEFT HEART CATHETERIZATION WITH CORONARY ANGIOGRAM;  Surgeon: Blane Ohara, MD;  Location: [facility] CATH LAB;  Service: Cardiovascular;  Laterality: N/A;  . Coronary artery bypass graft N/A 06/15/2014    Procedure: CORONARY ARTERY BYPASS GRAFTING (CABG);  Surgeon: Ivin Poot, MD;  Location: [place];  Service: Open Heart Surgery;  Laterality: N/A;  . Intraoperative transesophageal echocardiogram N/A 06/15/2014    Procedure: INTRAOPERATIVE TRANSESOPHAGEAL ECHOCARDIOGRAM;  Surgeon: Ivin Poot, MD;  Location: [place];  Service: Open Heart Surgery;  Laterality: N/A;   No family history on file.  Joyce [name] has lived in a SNF since discharge from hospital s/p CABG in December 2015. SNF is [facility]: [phone]. [place] [address]. She is on a "low sugar" diet. She is able to independently complete ADLs. She denies tobacco, alcohol, or recreational drug use.   Review of Systems: General: positive for fatigue, decreased appetite for 2-3 weeks. negative for fevers, chills, sick contacts HEENT: positive for congestion Cardiac: negative for CP Respiratory: positive for SOB,orthopnea, dry cough. negative for wheezing, hemoptysis, COPD, asthma GI: positive for constipation (BM 2 days ago). negative for nausea, vomiting, abdominal pain GU: positive for frequency (lasix-induced). Negative for burning, dysuria Musculoskeletal: positive for back pain (flanks). R leg larger than L leg (vein for CABG from R leg). Ambulates w/o walker Neuro: positive for generalized weakness Vascular: positive for edema   Physical Exam: Blood pressure 147/66, pulse  98, temperature 98.4 F (36.9 C), temperature source Oral, resp. rate 22, height 5\' 5"  (1.651 m), weight 87.998 kg (194 lb), SpO2 99 %.  General: sitting in chair, NAD, Alert, cooperative, well-nourished, appears stated age 3: normocephalic, atraumatic, PERRL, EOMI, MMM Heart: RRR, no m/r/g Lungs: decreased breath sounds in lung bases. Pursing lips during exhalation. Otherwise, CTAB. No crackles, wheezes.  Abd: mild CVA tenderness bilaterally. soft, anterior nontender, nondistended Musculoskeletal: 2+ bilateral lower extremity edema to mid-calf Neuro: AOx3 Skin: warm, well perfused. No rash noted.  Lab results: CBC    Component Value Date/Time   WBC 4.0 07/26/2014 1015   RBC 3.58* 07/26/2014 1015   HGB 9.9* 07/26/2014 1015   HCT 30.3* 07/26/2014 1015   PLT 190 07/26/2014 1015   MCV 84.6 07/26/2014 1015   MCH 27.7 07/26/2014 1015   MCHC 32.7 07/26/2014 1015   RDW 14.2 07/26/2014 1015  LYMPHSABS 1.0 07/26/2014 1015   MONOABS 0.3 07/26/2014 1015   EOSABS 0.0 07/26/2014 1015   BASOSABS 0.0 07/26/2014 1015   BMET    Component Value Date/Time   NA 134* 07/26/2014 1015   K 4.0 07/26/2014 1015   CL 101 07/26/2014 1015   CO2 25 07/26/2014 1015   GLUCOSE 108* 07/26/2014 1015   BUN 27* 07/26/2014 1015   CREATININE 1.50* 07/26/2014 1015   CALCIUM 8.9 07/26/2014 1015   GFRNONAA 34* 07/26/2014 1015   GFRAA 39* 07/26/2014 1015    Troponin (Point of Care Test)  Recent Labs  07/26/14 1043  TROPIPOC 0.01   BNP No results found for: PROBNP CBG (last 3)   Recent Labs  07/26/14 1455 07/26/14 1606  GLUCAP 112* 167*    Imaging results:  Ct Angio Chest Pe W/cm &/or Wo Cm  07/26/2014   CLINICAL DATA:  Chest pain, shortness of breath.  EXAM: CT ANGIOGRAPHY CHEST WITH CONTRAST  TECHNIQUE: Multidetector CT imaging of the chest was performed using the standard protocol during bolus administration of intravenous contrast. Multiplanar CT image reconstructions and MIPs were  obtained to evaluate the vascular anatomy.  CONTRAST:  59mL OMNIPAQUE IOHEXOL 350 MG/ML SOLN  COMPARISON:  Chest radiograph of same day.  FINDINGS: Large bilateral pleural effusions are noted with associated atelectasis of both lower lobes. No pneumothorax is noted. Mild opacity is noted in the left upper lobe concerning for subsegmental atelectasis. Status post coronary artery bypass graft. There is no evidence of thoracic aortic dissection. Ascending aorta is mildly dilated at 3.8 cm. There is no definite evidence of pulmonary embolus. No significant mediastinal mass or adenopathy is noted. No significant osseous abnormality is noted.  Review of the MIP images confirms the above findings.  IMPRESSION: No definite evidence of pulmonary embolus.  Large bilateral pleural effusions are noted with associated atelectasis of both lower lobes.  Mild dilatation of ascending thoracic aorta measured at 3.8 cm.   Electronically Signed   By: Sabino Dick M.D.   On: 07/26/2014 14:13   Dg Chest Port 1 View  07/26/2014   CLINICAL DATA:  SOB x 3 weeks; worsening greatly in the last 2 days. Hx of CABG June 16, 2014.  EXAM: PORTABLE CHEST - 1 VIEW  COMPARISON:  07/15/2014  FINDINGS: Bilateral effusions and lung base opacity, most likely atelectasis, have mildly increased from the prior exam. No pulmonary edema.  Changes from CABG surgery are stable. Cardiac silhouette is mostly obscured by the lung base opacity. No mediastinal or hilar masses.  No pneumothorax.  IMPRESSION: 1. Mild increase in bilateral pleural effusions and associated lung base atelectasis. No pulmonary edema.   Electronically Signed   By: Lajean Manes M.D.   On: 07/26/2014 11:03    Other results: EKG: Normal sinus rhythm. Nonspecific T wave abnormality. No significant change since last tracing. Confirmed by Ashok Cordia MD, Lennette Bihari ([id number]) on 07/26/2014 10:00:54 AM  Assessment & Plan by Problem: Joyce [name] is a 74yo female with a PMHx of CKD, T2DM,  HTN, fibromyalgia, and anginal pain s/p CABG x4 who presents with 2 weeks of shortness of breath. Pleural effusions are the most likely cause of SOB. Last echo 12/21 showed EF 45-50%, an improvement from pre-bypass tests. Effusions may be from underlying CHF. Differential also includes PE, pneumothorax, and pneumonia. PE unlikely given negative CTA. No evidence of pneumothorax on CXR. Pneumonia unlikely given no findings on CXR as well as no fevers or recent hospitalization/sick contact exposure.  Pleural Effusions: CXR and CTA chest revealed bilateral pleural effusions. Per note from Dr. Roxy Manns (CT surgery), pt has been made aware of need for thoracentesis.  -- repeat CXR tomorrow morning 07/27/14 -- repeat echo -- thoracentesis tomorrow morning (07/27/14)  -- One dose 40mg  IV lasix today. Increase to one dose 80mg  IV lasix tomorrow. -- PT/OT consult  Flank Pain: on physical exam, pt had mild bilateral flank pain and reports urinary frequency. Currently, no fevers or dysuria.  -- follow up UA  Anginal CP s/p CABG: on 40mg  atorvastatin (Lipitor) and 325mg  Aspirin -- continue Lipitor and Aspirin -- follow CT surgery recommendations  HTN: Takes 10mg  amlodipine (Norvasc), 40mg  lasix, 75mg  hydralazine TID at home - continue home Norvasc and hydralazine -- adjust lasix as described above  T2DM: pt reports sugars have been running 80-150. She is treated with humalog TID and Lantus.  -- continue Humalog 8u TID with meals; Lantus 10u qHS -- continue to monitor daily BG TID before meals and at bedtime  Fibromyalgia: treated with 100mg  trazadone,  50mg  primidone (Mysoline), and 50mg  tramadol daily + 50mg  tramadol q6H PRN at home -- continue trazadone, primidone, and tramadol  FEN/GI:  -- carb modified diet  PPx: -- subcutaneous heparin 5,000 units q8H  Code: FULL CODE -- contact daughter, Lovie Chol [phone]) or son, Bertie Burchill [phone]) in the event that patient is unable to make  medical decisions  Dispo: Disposition is deferred at this time, awaiting improvement of current medical problems.  The patient does have a current PCP (Dr. Bailey Mech and Jacklynn Bue in [place]) and Cardiologist (Dr. Sherren Mocha, and Dr. Nils Pyle, cardiothoracic surgery). Need for an [facility] hospital follow-up appointment after discharge TBD.  The patient does not know have transportation limitations that hinder transportation to clinic appointments.  Signed: Louisa Second, Med Student 07/26/2014, 6:34 PM

## 2014-07-26 NOTE — ED Notes (Signed)
I-stat Troponin is appearing in results and states 0.01ng/mL

## 2014-07-26 NOTE — ED Notes (Signed)
Patient's Joyce [name] is [facility] [phone].  [place] [address]

## 2014-07-26 NOTE — H&P (Signed)
Date: 07/26/2014               Patient Name:  Joyce [name] MRN: [id number]  DOB: Jan 26, 1941 Age / Sex: 74 y.o., female   PCP: No primary care provider on file.         Medical Service: Internal Medicine Teaching Service         Attending Physician: Dr. Karren Cobble, MD    First Contact: Annita Brod, MS3 Dr. Karle Starch Moding Pager: [phone]  Second Contact: Dr. Juluis Mire Pager: [phone]       After Hours (After 5p/  First Contact Pager: [phone]  weekends / holidays): Second Contact Pager: [phone]   Chief Complaint: shortness of breath  History of Present Illness: 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  Four days ago, she reports being unable to participate in rehab. Her oxygen requirement increased during this time from 2L to 4L, and she feels short of breath at all times irrespective of exertion; she cannot tolerate lying supine at all and has to sit up. She reports associated leg swelling, decreased appetite, and cough [particularly when she sits up from [place] though denies any chest pain, chest tightness, wheezing, sick contacts, fevers, prior smoking history, prior lung disease.   Of note, she underwent cardiac cath on 06/11/14 which was notable for EF 55-65% and severe three-vessel disease [LAD 80-90% stenosis, AV circumflex 95-99% stenosis, and RCA 75-80% stenosis]. She then underwent CABG x 4 by Dr. Unknown Jim on 06/16/14. was seen by Dr. Unknown Jim on 07/15/13 at which time she recalled feeling short of breath. CXR then showed bilateral to moderate pleural effusions, so four-day course of metolazone 5mg  was added to her Lasix 40mg . She cannot recall taking these medications as she reports the facility handles her medication. She otherwise denies dizziness, abdominal pain, nausea, vomiting, diarrhea, hematuria, hemoptysis, dysuria though has not had a bowel  movement since 2 days ago.   In the ED, CTA was ordered and showed large bilateral pleural effusions but no PE. She was given Lasix 40mg  IV x 1.  Meds: No current facility-administered medications for this encounter.   Current Outpatient Prescriptions  Medication Sig Dispense Refill  . acetaminophen (TYLENOL) 325 MG tablet Take by mouth every 6 (six) hours as needed for mild pain or moderate pain.    Marland Kitchen HUMALOG KWIKPEN 100 UNIT/ML KiwkPen Inject 8 Units into the skin 3 (three) times daily with meals.   1  . hydrALAZINE (APRESOLINE) 25 MG tablet Take 3 tablets (75 mg total) by mouth every 8 (eight) hours. (Patient taking differently: Take 75 mg by mouth 3 (three) times daily. )    . insulin glargine (LANTUS) 100 UNIT/ML injection Inject 10 Units into the skin at bedtime.    . metFORMIN (GLUCOPHAGE) 500 MG tablet Take 500 mg by mouth 2 (two) times daily with a meal.    . potassium chloride (K-DUR,KLOR-CON) 10 MEQ tablet Take 10 mEq by mouth daily.    . traMADol (ULTRAM) 50 MG tablet Take 1 tablet (50 mg total) by mouth every 6 (six) hours as needed for moderate pain. 30 tablet 0  . traZODone (DESYREL) 100 MG tablet Take 100 mg by mouth at bedtime.   0  . amLODipine (NORVASC) 10 MG tablet Take 10 mg by mouth daily.   5  . aspirin EC 325 MG EC tablet  Take 1 tablet (325 mg total) by mouth daily. 30 tablet 0  . atorvastatin (LIPITOR) 40 MG tablet Take 1 tablet (40 mg total) by mouth daily at 6 PM.    . ferrous [id number] C-folic acid (TRINSICON / FOLTRIN) capsule Take 1 capsule by mouth daily with breakfast. For one month then stop.    . furosemide (LASIX) 40 MG tablet Take 40 mg by mouth daily.    . insulin aspart (NOVOLOG) 100 UNIT/ML injection Inject 0-24 Units into the skin 4 (four) times daily -  before meals and at bedtime. Units of Insulin If CBG,120=0     251-300=12 121-160=2         301-350=16 161-200=4         351-450=20 201-250=8         >450=24 and call MD 10 mL 11  .  primidone (MYSOLINE) 50 MG tablet Take 50 mg by mouth daily.   5    Allergies: Allergies as of 07/26/2014 - Review Complete 07/26/2014  Allergen Reaction Noted  . Carvedilol Other (See Comments) 06/11/2014  . Codeine Other (See Comments) 06/11/2014  . Metoprolol Palpitations 06/11/2014   Past Medical History  Diagnosis Date  . Chronic kidney disease   . Diabetes mellitus without complication   . Hypertension   . Anginal pain    Past Surgical History  Procedure Laterality Date  . Cholecystectomy    . Abdominal hysterectomy    . Tonsillectomy    . Left heart catheterization with coronary angiogram N/A 06/11/2014    Procedure: LEFT HEART CATHETERIZATION WITH CORONARY ANGIOGRAM;  Surgeon: Blane Ohara, MD;  Location: [facility] CATH LAB;  Service: Cardiovascular;  Laterality: N/A;  . Coronary artery bypass graft N/A 06/15/2014    Procedure: CORONARY ARTERY BYPASS GRAFTING (CABG);  Surgeon: Ivin Poot, MD;  Location: [place];  Service: Open Heart Surgery;  Laterality: N/A;  . Intraoperative transesophageal echocardiogram N/A 06/15/2014    Procedure: INTRAOPERATIVE TRANSESOPHAGEAL ECHOCARDIOGRAM;  Surgeon: Ivin Poot, MD;  Location: [place];  Service: Open Heart Surgery;  Laterality: N/A;   No family history on file. History   Social History  . Marital Status: Married    Spouse Name: N/A    Number of Children: N/A  . Years of Education: N/A   Occupational History  . Not on file.   Social History Main Topics  . Smoking status: Never Smoker   . Smokeless tobacco: Not on file  . Alcohol Use: Not on file  . Drug Use: Not on file  . Sexual Activity: Not on file   Other Topics Concern  . Not on file   Social History Narrative    Review of Systems: Review of Systems  Constitutional: Negative for fever and chills.  Respiratory: Positive for cough and shortness of breath. Negative for hemoptysis, sputum production and wheezing.   Cardiovascular: Positive for orthopnea and  leg swelling. Negative for chest pain.  Gastrointestinal: Positive for constipation.       Last bowel movement 2 days ago  Neurological: Negative for dizziness.      Physical Exam: Blood pressure 136/64, pulse 101, temperature 98 F (36.7 C), temperature source Oral, resp. rate 23, SpO2 91 %. General: sitting up in chair with pillow behind her, 4L O2 by [place] HEENT: PERRL, EOMI, no scleral icterus, oropharynx clear Chest: scar along the mid-sternum consistent with prior procedure Cardiac: tachycardic, no rubs, murmurs or gallops Pulm: bibasilar crackles Abd: soft, nontender, nondistended, BS present Ext: warm and well  perfused, 2+ pitting edema bilaterally up to the knees Neuro: responds to questions appropriately; moving all extremities freely   Lab results: Basic Metabolic Panel:  Recent Labs  07/26/14 1015  NA 134*  K 4.0  CL 101  CO2 25  GLUCOSE 108*  BUN 27*  CREATININE 1.50*  CALCIUM 8.9   CBC:  Recent Labs  07/26/14 1015  WBC 4.0  NEUTROABS 2.6  HGB 9.9*  HCT 30.3*  MCV 84.6  PLT 190   CBG:  Recent Labs  07/26/14 1455  GLUCAP 112*    Imaging results:  Ct Angio Chest Pe W/cm &/or Wo Cm  07/26/2014   CLINICAL DATA:  Chest pain, shortness of breath.  EXAM: CT ANGIOGRAPHY CHEST WITH CONTRAST  TECHNIQUE: Multidetector CT imaging of the chest was performed using the standard protocol during bolus administration of intravenous contrast. Multiplanar CT image reconstructions and MIPs were obtained to evaluate the vascular anatomy.  CONTRAST:  23mL OMNIPAQUE IOHEXOL 350 MG/ML SOLN  COMPARISON:  Chest radiograph of same day.  FINDINGS: Large bilateral pleural effusions are noted with associated atelectasis of both lower lobes. No pneumothorax is noted. Mild opacity is noted in the left upper lobe concerning for subsegmental atelectasis. Status post coronary artery bypass graft. There is no evidence of thoracic aortic dissection. Ascending aorta is mildly dilated at  3.8 cm. There is no definite evidence of pulmonary embolus. No significant mediastinal mass or adenopathy is noted. No significant osseous abnormality is noted.  Review of the MIP images confirms the above findings.  IMPRESSION: No definite evidence of pulmonary embolus.  Large bilateral pleural effusions are noted with associated atelectasis of both lower lobes.  Mild dilatation of ascending thoracic aorta measured at 3.8 cm.   Electronically Signed   By: Sabino Dick M.D.   On: 07/26/2014 14:13   Dg Chest Port 1 View  07/26/2014   CLINICAL DATA:  SOB x 3 weeks; worsening greatly in the last 2 days. Hx of CABG June 16, 2014.  EXAM: PORTABLE CHEST - 1 VIEW  COMPARISON:  07/15/2014  FINDINGS: Bilateral effusions and lung base opacity, most likely atelectasis, have mildly increased from the prior exam. No pulmonary edema.  Changes from CABG surgery are stable. Cardiac silhouette is mostly obscured by the lung base opacity. No mediastinal or hilar masses.  No pneumothorax.  IMPRESSION: 1. Mild increase in bilateral pleural effusions and associated lung base atelectasis. No pulmonary edema.   Electronically Signed   By: Lajean Manes M.D.   On: 07/26/2014 11:03    Other results: EKG: Reviewed and compared with 06/16/14 Sinus tachycardia Normal axis  Assessment & Plan by Problem:  Bilateral pleural effusion: Likely the cause of her worsening dyspnea. Definitive diagnosis would require thoracentesis to determine if transudate or exudate. May be suggestive of overall systolic dysfunction given associated leg edema with recent CABG procedure.  -Repeat echo -Continue telemetry -Consult IR for thoracentesis -Give Lasix 40mg  IV -Repeat CXR with PA & lateral -Consult PT/OT to reassess her functional status  CHF: EF 45-50% with diffuse hypokinesis on TEE 12/21.  -Continue hydralazine 75mg  TID -Lasix & echo as noted above  CAD s/p CABG: Continue ASA 325mg  & Lipitor 40mg .   Chronic kidney disease  Stage 3B: Crt 1.5 on admission, baseline 1.3-1.7 though several values at 1.9 back in December.  -Check UA  Fibromyalgia: Continue Tramadol 50mg  q6h prn for pain & primidone 50mg .   DM2: A1c 5.9, Dec 2015. Home regimen is Lantus 40u QHS &  Novolog 8u TID per her.  -Continue Lantus 10u QHS & SSI-M   #FEN:  -Diet: Carb Modified  #DVT prophylaxis: heparin 5000 units subcutaneous  #CODE STATUS: FULL CODE -Defer to daughter Lovie Chol [id number] or son Alizia Jankovic [[phone]] if patients lacks decision-making capacity -Confirmed with patient on admission  Dispo: Disposition is deferred at this time, awaiting improvement of current medical problems.   The patient does have a current PCP (No primary care provider on file.) and does not need an [facility] hospital follow-up appointment after discharge.  The patient does have transportation limitations that hinder transportation to clinic appointments.  Signed: Charlott Rakes, MD 07/26/2014, 3:04 PM

## 2014-07-26 NOTE — Progress Notes (Signed)
[address]       [place] [address]             [phone]     CARDIOTHORACIC SURGERY PROGRESS NOTE  Subjective: Patient reports several day history of worsening shortness of breath, leading up to this morning when she experienced resting shortness of breath despite increased oxygen therapy.  No chest pain or chest pressure.    Objective: Vital signs in last 24 hours: Temp:  [98 F (36.7 C)-98.4 F (36.9 C)] 98.4 F (36.9 C) (01/31 1601) Pulse Rate:  [87-101] 98 (01/31 1601) Cardiac Rhythm:  [-] Normal sinus rhythm (01/31 0959) Resp:  [17-25] 22 (01/31 1601) BP: (121-147)/(52-90) 147/66 mmHg (01/31 1601) SpO2:  [91 %-100 %] 99 % (01/31 1601) Weight:  [87.998 kg (194 lb)] 87.998 kg (194 lb) (01/31 1601)  Physical Exam:  Rhythm:   sinus  Breath sounds: Diminished both bases w/ bilateral insp rales  Heart sounds:  RRR  Incisions:  Healing nicely, sternum stable  Abdomen:  Soft, non-distended, non-tender  Extremities:  Warm, well-perfused, mild LE edema R>L   Intake/Output from previous day:   Intake/Output this shift: Total I/O In: -  Out: 550 [Urine:550]  Lab Results:  Recent Labs  07/26/14 1015  WBC 4.0  HGB 9.9*  HCT 30.3*  PLT 190   BMET:  Recent Labs  07/26/14 1015  NA 134*  K 4.0  CL 101  CO2 25  GLUCOSE 108*  BUN 27*  CREATININE 1.50*  CALCIUM 8.9    CBG (last 3)   Recent Labs  07/26/14 1455 07/26/14 1606  GLUCAP 112* 167*   PT/INR:  No results for input(s): LABPROT, INR in the last 72 hours.  CXR:  PORTABLE CHEST - 1 VIEW  COMPARISON: 07/15/2014  FINDINGS: Bilateral effusions and lung base opacity, most likely atelectasis, have mildly increased from the prior exam. No pulmonary edema.  Changes from CABG surgery are stable. Cardiac silhouette is mostly obscured by the lung base opacity. No mediastinal or hilar masses.  No pneumothorax.  IMPRESSION: 1. Mild increase in bilateral pleural effusions  and associated lung base atelectasis. No pulmonary edema.   Electronically Signed  By: Lajean Manes M.D.  On: 07/26/2014 11:03   CT ANGIOGRAPHY CHEST WITH CONTRAST  TECHNIQUE: Multidetector CT imaging of the chest was performed using the standard protocol during bolus administration of intravenous contrast. Multiplanar CT image reconstructions and MIPs were obtained to evaluate the vascular anatomy.  CONTRAST: 17mL OMNIPAQUE IOHEXOL 350 MG/ML SOLN  COMPARISON: Chest radiograph of same day.  FINDINGS: Large bilateral pleural effusions are noted with associated atelectasis of both lower lobes. No pneumothorax is noted. Mild opacity is noted in the left upper lobe concerning for subsegmental atelectasis. Status post coronary artery bypass graft. There is no evidence of thoracic aortic dissection. Ascending aorta is mildly dilated at 3.8 cm. There is no definite evidence of pulmonary embolus. No significant mediastinal mass or adenopathy is noted. No significant osseous abnormality is noted.  Review of the MIP images confirms the above findings.  IMPRESSION: No definite evidence of pulmonary embolus.  Large bilateral pleural effusions are noted with associated atelectasis of both lower lobes.  Mild dilatation of ascending thoracic aorta measured at 3.8 cm.   Electronically Signed  By: Sabino Dick M.D.  On: 07/26/2014 14:13   Assessment/Plan:  Patient is well-known to our service, s/p CABG x4 by Dr Prescott Gum on 06/15/2014 for severe 3-vessel CAD s/p acute non-STEMI.  Her initial post-op recovery was uncomplicated although her physical recovery slow because of her pre-existing comorbid conditions and limited mobility.  She was discharged to a rehab facility in [place], [place] and seen in follow up by Dr Prescott Gum on 07/15/2013 at which time she noted to have small-moderate bilateral pleural effusion.  She has remained on lasix 40 mg/day and reports no  significant change in weight over the last 1-2 weeks.  Her weight is notably 4.5 kg down in comparison with immediately prior to hospital discharge on 06/22/2014.  Portable CXR and CTA chest reveal moderate-large bilateral pleural effusion.  I think she would benefit from bilateral thoracentesis, although it might be wise to do one side at a time.  Discussed with patient.  Will ask IR to perform right thoracentesis tomorrow.  Agree w/ IV diuretics.   Elmus Mathes H 07/26/2014 6:11 PM

## 2014-07-26 NOTE — ED Provider Notes (Signed)
CSN: [id number]     Arrival date & time 07/26/14  [id number] History   First MD Initiated Contact with Patient 07/26/14 [id number]     Chief Complaint  Patient presents with  . Shortness of Breath     (Consider location/radiation/quality/duration/timing/severity/associated sxs/prior Treatment) HPI Pt is a 74yo female with hx of CKD, IDDM type 2, HTN, anginal pain, s/p CABG 06/15/14 by Dr. Nils Pyle at [facility], currently staying at a rehab facility, [facility], in [place], presenting to ED with c/o gradually worsening SOB for 2 weeks, significantly worse last night. Pt currently on home oxygen, was initially on 2.5L but last night had to increased to 4L.  States she is on 40mg  Lasix, takes daily, however, unable to get the fluid off her lungs. Reports bilateral lower leg edema.  Chest tightness that has gradually worsened since surgery. State someone at the facility she is currently at mentioned a thoracentesis and states "I hope to get the fluid off my lungs today."  Pt is accompanied by her son who is familiar with her medical history.  Reports taking all of her medications today except for her tramadol, requesting tramadol for her chest discomfort that is 4/10 at this time.   Cardiologist: Dr. Sherren Mocha, and  Dr. Nils Pyle, cardiothoracic surgery PCP: Dr. Bailey Mech and [facility] Kilts in [place]  Past Medical History  Diagnosis Date  . Chronic kidney disease   . Diabetes mellitus without complication   . Hypertension   . Anginal pain    Past Surgical History  Procedure Laterality Date  . Cholecystectomy    . Abdominal hysterectomy    . Tonsillectomy    . Left heart catheterization with coronary angiogram N/A 06/11/2014    Procedure: LEFT HEART CATHETERIZATION WITH CORONARY ANGIOGRAM;  Surgeon: Blane Ohara, MD;  Location: [facility] CATH LAB;  Service: Cardiovascular;  Laterality: N/A;  . Coronary artery bypass graft N/A 06/15/2014    Procedure: CORONARY ARTERY BYPASS GRAFTING (CABG);   Surgeon: Ivin Poot, MD;  Location: [facility];  Service: Open Heart Surgery;  Laterality: N/A;  . Intraoperative transesophageal echocardiogram N/A 06/15/2014    Procedure: INTRAOPERATIVE TRANSESOPHAGEAL ECHOCARDIOGRAM;  Surgeon: Ivin Poot, MD;  Location: [facility];  Service: Open Heart Surgery;  Laterality: N/A;   No family history on file. History  Substance Use Topics  . Smoking status: Never Smoker   . Smokeless tobacco: Not on file  . Alcohol Use: Not on file   OB History    No data available     Review of Systems  Constitutional: Positive for appetite change and fatigue. Negative for fever, chills and diaphoresis.  HENT: Positive for congestion.   Respiratory: Positive for chest tightness and shortness of breath. Negative for cough.   Cardiovascular: Positive for chest pain and leg swelling ( bilateral). Negative for palpitations.  Gastrointestinal: Negative for nausea, vomiting, abdominal pain and diarrhea.  Neurological: Positive for weakness ( generalized).  All other systems reviewed and are negative.     Allergies  Carvedilol; Codeine; and Metoprolol  Home Medications   Prior to Admission medications   Medication Sig Start Date End Date Taking? Authorizing Provider  acetaminophen (TYLENOL) 325 MG tablet Take by mouth every 6 (six) hours as needed for mild pain or moderate pain.   Yes Historical Provider, MD  HUMALOG KWIKPEN 100 UNIT/ML KiwkPen Inject 8 Units into the skin 3 (three) times daily with meals.  03/09/14  Yes Historical Provider, MD  hydrALAZINE (APRESOLINE) 25 MG  tablet Take 3 tablets (75 mg total) by mouth every 8 (eight) hours. Patient taking differently: Take 75 mg by mouth 3 (three) times daily.  06/22/14  Yes Donielle Liston Alba, PA-C  insulin glargine (LANTUS) 100 UNIT/ML injection Inject 10 Units into the skin at bedtime.   Yes Historical Provider, MD  metFORMIN (GLUCOPHAGE) 500 MG tablet Take 500 mg by mouth 2 (two) times daily with a meal.    Yes Historical Provider, MD  potassium chloride (K-DUR,KLOR-CON) 10 MEQ tablet Take 10 mEq by mouth daily.   Yes Historical Provider, MD  traMADol (ULTRAM) 50 MG tablet Take 1 tablet (50 mg total) by mouth every 6 (six) hours as needed for moderate pain. 06/22/14  Yes Donielle Liston Alba, PA-C  traZODone (DESYREL) 100 MG tablet Take 100 mg by mouth at bedtime.  04/29/14  Yes Historical Provider, MD  amLODipine (NORVASC) 10 MG tablet Take 10 mg by mouth daily.  05/28/14   Historical Provider, MD  aspirin EC 325 MG EC tablet Take 1 tablet (325 mg total) by mouth daily. 06/22/14   Donielle Liston Alba, PA-C  atorvastatin (LIPITOR) 40 MG tablet Take 1 tablet (40 mg total) by mouth daily at 6 PM. 06/22/14   Donielle Liston Alba, PA-C  ferrous Q000111Q C-folic acid (TRINSICON / FOLTRIN) capsule Take 1 capsule by mouth daily with breakfast. For one month then stop. 06/22/14   Donielle Liston Alba, PA-C  furosemide (LASIX) 40 MG tablet Take 40 mg by mouth daily.    Historical Provider, MD  insulin aspart (NOVOLOG) 100 UNIT/ML injection Inject 0-24 Units into the skin 4 (four) times daily -  before meals and at bedtime. Units of Insulin If CBG,120=0     251-300=12 121-160=2         301-350=16 161-200=4         351-450=20 201-250=8         >450=24 and call MD 06/22/14   Nani Skillern, PA-C  primidone (MYSOLINE) 50 MG tablet Take 50 mg by mouth daily.  05/28/14   Historical Provider, MD   BP 136/64 mmHg  Pulse 101  Temp(Src) 98 F (36.7 C) (Oral)  Resp 23  SpO2 91% Physical Exam  Constitutional: She appears well-developed and well-nourished. She appears distressed.  Elderly female sitting up in exam bed,  in place  HENT:  Head: Normocephalic and atraumatic.  Eyes: Conjunctivae are normal. No scleral icterus.  Neck: Normal range of motion.  Cardiovascular: Normal rate, regular rhythm and normal heart sounds.   Pulmonary/Chest: She is in respiratory distress. She has no wheezes.  She has no rales. She exhibits no tenderness.  Shortness of breath. Winded between sentences.  Decreased to absent breath sounds in lower lung fields bilaterally.  No crackles.    Abdominal: Soft. Bowel sounds are normal. She exhibits no distension and no mass. There is no tenderness. There is no rebound and no guarding.  Musculoskeletal: Normal range of motion. She exhibits edema ( 2+ bilateral lower extremities to mid-shin). She exhibits no tenderness.  Neurological: She is alert.  Skin: Skin is warm and dry. No rash noted. She is not diaphoretic. No erythema.  Nursing note and vitals reviewed.   ED Course  Procedures (including critical care time) Labs Review Labs Reviewed  BASIC METABOLIC PANEL - Abnormal; Notable for the following:    Sodium 134 (*)    Glucose, Bld 108 (*)    BUN 27 (*)    Creatinine, Ser 1.50 (*)    GFR  calc non Af Amer 34 (*)    GFR calc Af Amer 39 (*)    All other components within normal limits  CBC WITH DIFFERENTIAL/PLATELET - Abnormal; Notable for the following:    RBC 3.58 (*)    Hemoglobin 9.9 (*)    HCT 30.3 (*)    All other components within normal limits  CBG MONITORING, ED - Abnormal; Notable for the following:    Glucose-Capillary 112 (*)    All other components within normal limits  BRAIN NATRIURETIC PEPTIDE  I-STAT TROPOININ, ED    Imaging Review Ct Angio Chest Pe W/cm &/or Wo Cm  07/26/2014   CLINICAL DATA:  Chest pain, shortness of breath.  EXAM: CT ANGIOGRAPHY CHEST WITH CONTRAST  TECHNIQUE: Multidetector CT imaging of the chest was performed using the standard protocol during bolus administration of intravenous contrast. Multiplanar CT image reconstructions and MIPs were obtained to evaluate the vascular anatomy.  CONTRAST:  3mL OMNIPAQUE IOHEXOL 350 MG/ML SOLN  COMPARISON:  Chest radiograph of same day.  FINDINGS: Large bilateral pleural effusions are noted with associated atelectasis of both lower lobes. No pneumothorax is noted. Mild  opacity is noted in the left upper lobe concerning for subsegmental atelectasis. Status post coronary artery bypass graft. There is no evidence of thoracic aortic dissection. Ascending aorta is mildly dilated at 3.8 cm. There is no definite evidence of pulmonary embolus. No significant mediastinal mass or adenopathy is noted. No significant osseous abnormality is noted.  Review of the MIP images confirms the above findings.  IMPRESSION: No definite evidence of pulmonary embolus.  Large bilateral pleural effusions are noted with associated atelectasis of both lower lobes.  Mild dilatation of ascending thoracic aorta measured at 3.8 cm.   Electronically Signed   By: Sabino Dick M.D.   On: 07/26/2014 14:13   Dg Chest Port 1 View  07/26/2014   CLINICAL DATA:  SOB x 3 weeks; worsening greatly in the last 2 days. Hx of CABG June 16, 2014.  EXAM: PORTABLE CHEST - 1 VIEW  COMPARISON:  07/15/2014  FINDINGS: Bilateral effusions and lung base opacity, most likely atelectasis, have mildly increased from the prior exam. No pulmonary edema.  Changes from CABG surgery are stable. Cardiac silhouette is mostly obscured by the lung base opacity. No mediastinal or hilar masses.  No pneumothorax.  IMPRESSION: 1. Mild increase in bilateral pleural effusions and associated lung base atelectasis. No pulmonary edema.   Electronically Signed   By: Lajean Manes M.D.   On: 07/26/2014 11:03     EKG Interpretation   Date/Time:  Sunday July 26 2014 09:30:22 EST Ventricular Rate:  96 PR Interval:  154 QRS Duration: 88 QT Interval:  374 QTC Calculation: 472 R Axis:   75 Text Interpretation:  Normal sinus rhythm Nonspecific T wave abnormality  No significant change since last tracing Confirmed by STEINL  MD, Lennette Bihari  ([id number]) on 07/26/2014 10:00:54 AM      MDM   Final diagnoses:  SOB (shortness of breath)  Bilateral pleural effusion    Pt is a 74yo female s/p CABG x4 on 06/15/14 by Dr. Nils Pyle, presenting to ED  with c/o gradually worsening SOB. On exam, pt is on 4L [place], O2-99% at rest. Pt SOB between sentence, decreased to absent breath sounds in lower lung fields bilaterally. Pedal edema present.  Will perform cardiac workup and include BNP and CXR to evaluate amount of fluid overload pt may have. EKG: no significant change since last tracing.  CXR: mild increase in bilateral pleural effusions and associated lung base atelectasis. No pulmonary edema.   istat troponin: negative for elevation. Discussed pt with Dr. Ashok Cordia who also examined pt. Will consult with CT surgery as pt has already spoken with physician on-call for Dr. Nils Pyle today.  12:09 PM Consulted with Dr. Roxy Manns, CT surgery, reviewed pt's CXR.  Recommend getting a CT angio chest to r/o PE as pt seems acutely SOB and recent surgery.  Pt still likely to be admitted to medicine service with cardiology consult.   2:17 PM CT angio chest: significant for large bilateral pleural effusions noted with associated atelectasis of both lower lobes. Mild dilatation of ascending thoracic aorta measuring 3.8cm. Discussed new imaging with attending, Dr. Ashok Cordia, recommended pt be admitted over night with medicine service, pt may need IR for additional evaluation and treatment of pleural effusions.  Will consult with medicine to admit pt.       2:55 PM Consulted with teaching service, pt will be admitted to a tele bed, attending- Dr. Eppie Gibson, CT surgery will f/u in the morning.    Noland Fordyce, PA-C 07/26/14 [place], MD 07/27/14 [phone]

## 2014-07-26 NOTE — ED Notes (Signed)
Pt. Stated, I've been SOB for about 2 weeks but it just got worse last night.  i had open heart surgery about a month ago and I have fluid around my lungs and I can't breath.  i take Lasix and I think that's making me be weak and not able to breath good.

## 2014-07-27 ENCOUNTER — Inpatient Hospital Stay (HOSPITAL_COMMUNITY): Payer: Medicare Other

## 2014-07-27 DIAGNOSIS — E119 Type 2 diabetes mellitus without complications: Secondary | ICD-10-CM

## 2014-07-27 DIAGNOSIS — I10 Essential (primary) hypertension: Secondary | ICD-10-CM

## 2014-07-27 DIAGNOSIS — R0602 Shortness of breath: Secondary | ICD-10-CM

## 2014-07-27 DIAGNOSIS — M797 Fibromyalgia: Secondary | ICD-10-CM

## 2014-07-27 DIAGNOSIS — J90 Pleural effusion, not elsewhere classified: Secondary | ICD-10-CM | POA: Diagnosis present

## 2014-07-27 DIAGNOSIS — R609 Edema, unspecified: Secondary | ICD-10-CM

## 2014-07-27 DIAGNOSIS — K5900 Constipation, unspecified: Secondary | ICD-10-CM

## 2014-07-27 DIAGNOSIS — D649 Anemia, unspecified: Secondary | ICD-10-CM | POA: Diagnosis present

## 2014-07-27 DIAGNOSIS — I2510 Atherosclerotic heart disease of native coronary artery without angina pectoris: Secondary | ICD-10-CM

## 2014-07-27 DIAGNOSIS — R0600 Dyspnea, unspecified: Secondary | ICD-10-CM

## 2014-07-27 LAB — COMPREHENSIVE METABOLIC PANEL
ALBUMIN: 2.9 g/dL — AB (ref 3.5–5.2)
ALT: 13 U/L (ref 0–35)
AST: 17 U/L (ref 0–37)
Alkaline Phosphatase: 60 U/L (ref 39–117)
Anion gap: 4 — ABNORMAL LOW (ref 5–15)
BUN: 27 mg/dL — ABNORMAL HIGH (ref 6–23)
CO2: 32 mmol/L (ref 19–32)
CREATININE: 1.62 mg/dL — AB (ref 0.50–1.10)
Calcium: 8.8 mg/dL (ref 8.4–10.5)
Chloride: 100 mmol/L (ref 96–112)
GFR calc Af Amer: 36 mL/min — ABNORMAL LOW (ref >90)
GFR, EST NON AFRICAN AMERICAN: 31 mL/min — AB (ref >90)
Glucose, Bld: 106 mg/dL — ABNORMAL HIGH (ref 70–99)
POTASSIUM: 4.3 mmol/L (ref 3.5–5.1)
Sodium: 136 mmol/L (ref 135–145)
TOTAL PROTEIN: 5.3 g/dL — AB (ref 6.0–8.3)
Total Bilirubin: 0.4 mg/dL (ref 0.3–1.2)

## 2014-07-27 LAB — URINALYSIS, ROUTINE W REFLEX MICROSCOPIC
Bilirubin Urine: NEGATIVE
GLUCOSE, UA: NEGATIVE mg/dL
Hgb urine dipstick: NEGATIVE
Ketones, ur: NEGATIVE mg/dL
LEUKOCYTES UA: NEGATIVE
NITRITE: NEGATIVE
PH: 5.5 (ref 5.0–8.0)
PROTEIN: NEGATIVE mg/dL
Specific Gravity, Urine: 1.025 (ref 1.005–1.030)
UROBILINOGEN UA: 0.2 mg/dL (ref 0.0–1.0)

## 2014-07-27 LAB — IRON AND TIBC
Iron: 28 ug/dL — ABNORMAL LOW (ref 42–145)
SATURATION RATIOS: 13 % — AB (ref 20–55)
TIBC: 216 ug/dL — AB (ref 250–470)
UIBC: 188 ug/dL (ref 125–400)

## 2014-07-27 LAB — CBC
HEMATOCRIT: 27.7 % — AB (ref 36.0–46.0)
Hemoglobin: 9.1 g/dL — ABNORMAL LOW (ref 12.0–15.0)
MCH: 27.6 pg (ref 26.0–34.0)
MCHC: 32.9 g/dL (ref 30.0–36.0)
MCV: 83.9 fL (ref 78.0–100.0)
PLATELETS: 171 10*3/uL (ref 150–400)
RBC: 3.3 MIL/uL — ABNORMAL LOW (ref 3.87–5.11)
RDW: 14.1 % (ref 11.5–15.5)
WBC: 3.4 10*3/uL — ABNORMAL LOW (ref 4.0–10.5)

## 2014-07-27 LAB — APTT: aPTT: 31 seconds (ref 24–37)

## 2014-07-27 LAB — GLUCOSE, CAPILLARY
GLUCOSE-CAPILLARY: 112 mg/dL — AB (ref 70–99)
GLUCOSE-CAPILLARY: 107 mg/dL — AB (ref 70–99)
Glucose-Capillary: 173 mg/dL — ABNORMAL HIGH (ref 70–99)
Glucose-Capillary: 96 mg/dL (ref 70–99)

## 2014-07-27 LAB — PHOSPHORUS: PHOSPHORUS: 4.6 mg/dL (ref 2.3–4.6)

## 2014-07-27 LAB — PROTIME-INR
INR: 1.1 (ref 0.00–1.49)
PROTHROMBIN TIME: 14.3 s (ref 11.6–15.2)

## 2014-07-27 LAB — RETICULOCYTES
RBC.: 3.3 MIL/uL — ABNORMAL LOW (ref 3.87–5.11)
RETIC CT PCT: 2.8 % (ref 0.4–3.1)
Retic Count, Absolute: 92.4 10*3/uL (ref 19.0–186.0)

## 2014-07-27 LAB — VITAMIN B12: Vitamin B-12: 553 pg/mL (ref 211–911)

## 2014-07-27 LAB — FERRITIN: Ferritin: 180 ng/mL (ref 10–291)

## 2014-07-27 LAB — MRSA PCR SCREENING: MRSA BY PCR: NEGATIVE

## 2014-07-27 LAB — FOLATE

## 2014-07-27 LAB — MAGNESIUM: Magnesium: 1.6 mg/dL (ref 1.5–2.5)

## 2014-07-27 MED ORDER — MAGNESIUM HYDROXIDE 400 MG/5ML PO SUSP
30.0000 mL | Freq: Every day | ORAL | Status: DC | PRN
  Administered 2014-07-27: 30 mL via ORAL
  Filled 2014-07-27 (×2): qty 30

## 2014-07-27 MED ORDER — LISINOPRIL 5 MG PO TABS
5.0000 mg | ORAL_TABLET | Freq: Every day | ORAL | Status: DC
  Administered 2014-07-27 – 2014-07-28 (×2): 5 mg via ORAL
  Filled 2014-07-27 (×3): qty 1

## 2014-07-27 MED ORDER — FUROSEMIDE 10 MG/ML IJ SOLN
80.0000 mg | Freq: Every day | INTRAMUSCULAR | Status: DC
  Filled 2014-07-27: qty 8

## 2014-07-27 MED ORDER — LIDOCAINE HCL (PF) 1 % IJ SOLN
INTRAMUSCULAR | Status: AC
  Administered 2014-07-27: 11:00:00
  Filled 2014-07-27: qty 10

## 2014-07-27 NOTE — Progress Notes (Signed)
Subjective:    Joyce [name] is doing well this morning. She was in good spirits s/p R sided thoracentesis with ~1L removed. Her dyspnea is greatly improved, and she is breathing comfortably on 2L O2 [place] (decreased from 4L O2 yesterday). She denies chest pain. She reports that has not made much urine despite Lasix therapy.     Objective:    Vital Signs:   Temp:  [98.3 F (36.8 C)-98.5 F (36.9 C)] 98.3 F (36.8 C) (02/01 0419) Pulse Rate:  [88-101] 88 (02/01 0419) Resp:  [17-23] 18 (02/01 0419) BP: (121-148)/(52-80) 148/53 mmHg (02/01 1125) SpO2:  [91 %-100 %] 97 % (02/01 0419) Weight:  [87.998 kg (194 lb)] 87.998 kg (194 lb) (01/31 1601) Last BM Date: 07/27/14  24-hour weight change: Weight change: n/a  Intake/Output:   Intake/Output Summary (Last 24 hours) at 07/27/14 1217 Last data filed at 07/27/14 0423  Gross per 24 hour  Intake      0 ml  Output   1200 ml  Net  -1200 ml      Physical Exam: General: well-appearing, sitting in chair, NAD. Moderately obese. HEENT: EOMI, nares patent CV: RRR, normal S1 S2, no m/r/g Pulm: bibasilar crackles. normal work of breathing, no wheezes MSK: 2+ pitting edema to mid-calf bilaterally Skin: warm, well perfused. No rashes noted   Labs:  Basic Metabolic Panel:  Recent Labs Lab 07/27/14 0457  NA 136  K 4.3  CL 100  CO2 32  GLUCOSE 106*  BUN 27*  CREATININE 1.62*  CALCIUM 8.8  MG 1.6  PHOS 4.6    Liver Function Tests:  Recent Labs Lab 07/27/14 0457  AST 17  ALT 13  ALKPHOS 60  BILITOT 0.4  PROT 5.3*  ALBUMIN 2.9*    CBC:  Recent Labs Lab 07/26/14 1015 07/27/14 0457  WBC 4.0 3.4*  NEUTROABS 2.6  --   HGB 9.9* 9.1*  HCT 30.3* 27.7*  MCV 84.6 83.9  PLT 190 171    CBG (last 3)   Recent Labs  07/26/14 2136 07/27/14 0617 07/27/14 1218  GLUCAP 149* 112* 107*   Vitamin B-12: 533           07/27/14 0457  Folate: >20.0                    07/27/14 0457  Iron/TIBC/Ferritin/ %Sat      Component Value Date/Time   IRON 28* 07/27/2014 0457   TIBC 216* 07/27/2014 0457   FERRITIN 180 07/27/2014 0457   IRONPCTSAT 13* 07/27/2014 0457    Coagulation Studies:  Recent Labs  07/27/14 0457  LABPROT 14.3  INR 1.10    Imaging: DG Chest 2 View  CLINICAL DATA: 74 year old female with shortness of breath and clinical history of recent heart surgery. EXAM: CHEST 2 VIEW COMPARISON: Prior chest x-ray and chest CT scan 07/26/2014. FINDINGS: Stable cardiac and mediastinal contours. Patient is status post median sternotomy with evidence of multivessel CABG. There has been no significant interval change in the appearance of the chest. Persistent bilateral layering pleural effusions and associated bibasilar atelectasis. No overt pulmonary edema. No pneumothorax. Incompletely imaged anterior cervical fusion hardware. No acute osseous abnormality. IMPRESSION: 1. Stable bilateral layering pleural effusions and associated bibasilar atelectasis. 2. No evidence of pulmonary edema/CHF or other significant interval change. Electronically Signed By: Jacqulynn Cadet M.D. On: 07/27/2014 07:55  DG Chest 1 View  CLINICAL DATA: Right thoracentesis. EXAM: CHEST - 1 VIEW COMPARISON: 07/27/2014 at 7:33 a.m. FINDINGS: Decreased right  pleural effusion post thoracentesis. No air leak or re-expansion pulmonary edema. Unchanged large left pleural effusion with extensive atelectasis, reference chest CT from 1 day prior. No pulmonary edema. Stable cardiomegaly and aortic contours. The patient is status post CABG. IMPRESSION: No evidence of complication post right thoracentesis. Electronically Signed By: Jorje Guild M.D. On: 07/27/2014 12:08  Transthoracic Echocardiography  Study Date: 07/27/2014 ------------------------------------------------------------------ LV EF: 55% -  60% ------------------------------------------------------------------- Indications:   Dyspnea  786.09 ------------------------------------------------------------------- History:  PMH: Chronic Kidney Disease. Congestive heart failure. PMH:  Myocardial infarction. ------------------------------------------------------------------- Study Conclusions - Left ventricle: The cavity size was normal. Systolic function was normal. The estimated ejection fraction was in the range of 55%to 60%. Although no diagnostic regional wall motion abnormality was identified, this possibility cannot be completely excluded on the basis of this study. Doppler parameters are consistent with abnormal left ventricular relaxation (grade 1 diastolic dysfunction). - Ventricular septum: Septal motion showed paradox. - Atrial septum: No defect or patent foramen ovale was identified. - Pericardium, extracardiac: There was a left pleural effusion.    Medications:    Infusions:    Scheduled Medications: . amLODipine  10 mg Oral Daily  . aspirin EC  325 mg Oral Daily  . atorvastatin  40 mg Oral q1800  . feeding supplement (GLUCERNA SHAKE)  237 mL Oral Daily  . ferrous Q000111Q C-folic acid  1 capsule Oral Q breakfast  . furosemide  80 mg Intravenous Once  . heparin  5,000 Units Subcutaneous 3 times per day  . hydrALAZINE  75 mg Oral TID  . insulin aspart  0-15 Units Subcutaneous TID WC  . insulin glargine  10 Units Subcutaneous QHS  . primidone  50 mg Oral Daily  . traZODone  100 mg Oral QHS  . zinc oxide   Topical TID    PRN Medications: acetaminophen, magnesium hydroxide, traMADol   Assessment/ Plan:    Pt is a 74 y.o. yo female with a PMHx of CKD, T2DM, HTN, fibromyalgia, and CAD s/p CABGx4 who was admitted on 07/26/2014 with symptoms of SOB, which was determined to be secondary to bilateral pleural effusions. Interventions at this time will be focused on further workup for underlying cause of pleural effusions.    Pleural Effusions: Effusions are likely the cause of SOB. Last echo  12/21 showed EF 45-50%, an improvement from pre-bypass tests. Repeat echo 2/1 showed EF 55-60%. CXR and CTA chest on 07/26/14 revealed bilateral pleural effusions. Right-sided thoracentesis drained 1L of fluid; specimen sent for labs. Repeat CXR showed decreased right pleural effusion, unchanged left pleural effusion, and no complication from thoracentesis. Per UpToDate, ACE inhibitors lead to improvement of symptoms and enhanced survival in pts with heart failure reduced EF [[place]ucci, [place]. ACE inhibitors in heart failure with reduced ejection fraction: Therapeutic use. In, UpToDate, Karle Barr (Ed), UpToDate, [place], [place]. (Accessed on July 27, 2014)]. Joyce [name] was on Lisinopril prior to CABG. -- left-sided thoracentesis tomorrow morning (07/28/14)   -- repeat CXR tomorrow 07/28/14 after thoracentesis -- increased lasix to 80mg  IV today -- begin 5mg  linsinopril  CAD s/p CABG: on 40mg  atorvastatin (Lipitor) and 325mg  Aspirin -- continue Lipitor and Aspirin -- follow CT surgery recommendations  HTN: Takes 10mg  amlodipine (Norvasc), 40mg  lasix, 75mg  hydralazine TID at home - continue home Norvasc and hydralazine -- adjust lasix as described above  T2DM: pt reports sugars have been running 80-150. She is treated with humalog TID and Lantus. CBG have been 107-167 over the past 24hrs. -- continue SSI Humalog 8u  TID with meals; Lantus 10u qHS -- continue to monitor daily BG TID before meals and at bedtime  Fibromyalgia: treated with 100mg  trazadone, 50mg  primidone (Mysoline), and 50mg  tramadol daily + 50mg  tramadol q6H PRN at home -- continue trazadone, primidone, and tramadol  Flank Pain: on admission, pt had mild bilateral flank pain and reported urinary frequency. Currently, no fevers or dysuria. UA negative. -- continue to monitor  FEN/GI:  -- carb modified diet  PPx: -- subcutaneous heparin 5,000 units q8H  Code: FULL CODE -- contact daughter, Lovie Chol [phone]) or son, Merridy Bedonie [phone]) in the event that patient is unable to make medical decisions  Consults placed: -- PT: recommendations below -- OT: recommendations below -- CT Surgery: agree w/ plans for echo and thoracentesis  Dispo: Disposition is deferred at this time, awaiting improvement of current medical problems. Pt would prefer to be discharged directly to home rather than SNF.  The patient does have a current PCP (Dr. Bailey Mech and Jacklynn Bue in [place]) and Cardiologist (Dr. Sherren Mocha, and Dr. Nils Pyle, cardiothoracic surgery). Need for an [facility] hospital follow-up appointment after discharge TBD.  Unknown if patient will have transportation limitations that hinder transportation to clinic appointments.  SERVICE NEEDED AT [place]         Y = Yes, Blank = No PT:  yes - home health PT; intermittent supervision  OT:  yes - home health OT; intermittent supervision  RN:   Equipment:   Other:      Length of Stay: 1 day(s)   Signed: Louisa Second, Med Student  Pager: [phone] (7AM-5PM) 07/27/2014, 12:17 PM

## 2014-07-27 NOTE — Evaluation (Signed)
Physical Therapy Evaluation Patient Details Name: Joyce [name] MRN: [id number] DOB: 1941-05-14 Today's Date: 07/27/2014   History of Present Illness  74 yo female who was in rehab s/p CABG and now returns to hospital from rehab with B pleural effusions.  Pt with thoracentesis scheduled for today.    Clinical Impression  Pt functioning at supervision level with exception of transfer out of bed however pt plans on sleeping in recliner. Pt's biggest limitation is decrease activity tolerance due to onset of SOB due to bilat pleural effusions. Anticipate pt to be able to return home with assist of family to complete chores. Patient aware she will be limited on laundry and cooking and reports sister and children will be able to help her with those chores. Acute PT to con't to follow to progress ambulation and further educate on energy conservation techniques.    Follow Up Recommendations Home health PT;Supervision - Intermittent    Equipment Recommendations       Recommendations for Other Services       Precautions / Restrictions Precautions Precautions: Sternal Precaution Comments: Reviewed precautions Restrictions Weight Bearing Restrictions: Yes Other Position/Activity Restrictions: sternal precautions.      Mobility  Bed Mobility Overal bed mobility: Needs Assistance Bed Mobility: Supine to Sit     Supine to sit: Min assist     General bed mobility comments: pt reports her children are purchasing a recliner for her to sleep in so she doesn't have to lay flat. assist for trunk elevation per patient request to eliminate using UEs  Transfers Overall transfer level: Needs assistance Equipment used: 1 person hand held assist Transfers: Sit to/from Stand Sit to Stand: Supervision         General transfer comment: Pt did well with following sternal precautions.  Ambulation/Gait Ambulation/Gait assistance: Min guard Ambulation Distance (Feet): 60 Feet Assistive device:  Rolling walker (2 wheeled) Gait Pattern/deviations: Step-through pattern     General Gait Details: pt able to functionally amb without AD however for energy conservation purposes pt benefited from RW. ambulation tolerance limited by SOB due to bilat pleaural effusions howeve with no episodes of instability or LOB  Stairs            Wheelchair Mobility    Modified Rankin (Stroke Patients Only)       Balance Overall balance assessment: Needs assistance Sitting-balance support: No upper extremity supported;Feet supported Sitting balance-Leahy Scale: Good     Standing balance support: Bilateral upper extremity supported;During functional activity Standing balance-Leahy Scale: Good Standing balance comment: Pt could stand and walk some on her own with no LOB.  Pt fatigues quickly needing rest breaks.                             Pertinent Vitals/Pain Pain Assessment: No/denies pain    Home Living Family/patient expects to be discharged to:: Private residence Living Arrangements: Alone Available Help at Discharge: Family;Available PRN/intermittently Type of Home: House Home Access: Stairs to enter Entrance Stairs-Rails: Right Entrance Stairs-Number of Steps: 3 Home Layout: Two level;Able to live on main level with bedroom/bathroom Home Equipment: Gilford Rile - 2 wheels;Cane - single point;Bedside commode;Shower seat      Prior Function Level of Independence: Independent         Comments: was at rehab for last month not using AD, riding bike and ambulating     Hand Dominance   Dominant Hand: Right    Extremity/Trunk Assessment  Upper Extremity Assessment: Overall WFL for tasks assessed (within sternal prec)           Lower Extremity Assessment: Overall WFL for tasks assessed      Cervical / Trunk Assessment: Normal  Communication   Communication: No difficulties  Cognition Arousal/Alertness: Awake/alert Behavior During Therapy: WFL for tasks  assessed/performed Overall Cognitive Status: Within Functional Limits for tasks assessed                      General Comments General comments (skin integrity, edema, etc.): Pt biggest limitation right now is low activity tolerance.  Reviewed energy conservation techniques she could use at home.    Exercises        Assessment/Plan    PT Assessment Patient needs continued PT services  PT Diagnosis Difficulty walking;Abnormality of gait;Generalized weakness   PT Problem List Decreased strength;Decreased activity tolerance;Decreased range of motion;Decreased balance;Decreased mobility;Decreased knowledge of use of DME;Decreased knowledge of precautions;Cardiopulmonary status limiting activity;Pain  PT Treatment Interventions DME instruction;Gait training;Functional mobility training;Therapeutic activities;Therapeutic exercise;Balance training;Neuromuscular re-education;Patient/family education;Modalities   PT Goals (Current goals can be found in the Care Plan section) Acute Rehab PT Goals Patient Stated Goal: to go straight home from here. PT Goal Formulation: With patient Time For Goal Achievement: 08/03/14 Potential to Achieve Goals: Good    Frequency Min 3X/week   Barriers to discharge        Co-evaluation               End of Session Equipment Utilized During Treatment: Gait belt;Oxygen Activity Tolerance: Patient tolerated treatment well Patient left: in chair;with call bell/phone within reach Nurse Communication: Mobility status         Time: [id number] PT Time Calculation (min) (ACUTE ONLY): 27 min   Charges:   PT Evaluation $Initial PT Evaluation Tier I: 1 Procedure PT Treatments $Gait Training: 8-22 mins   PT G CodesKingsley Callander 07/27/2014, 10:09 AM   Kittie Plater, PT, DPT Pager #: [phone] Office #: [phone]

## 2014-07-27 NOTE — Progress Notes (Signed)
*  PRELIMINARY RESULTS* Vascular Ultrasound Lower extremity venous duplex has been completed.  Preliminary findings: no evidence of DVT.   Landry Mellow, RDMS, RVT  07/27/2014, 3:12 PM

## 2014-07-27 NOTE — Progress Notes (Signed)
Internal Medicine Attending Admission Note Date: 07/27/2014  Patient name: Joyce [name] Medical record number: [id number] Date of birth: Dec 10, 1940 Age: 74 y.o. Gender: female  I saw and evaluated the patient. I reviewed the resident's note and I agree with the resident's findings and plan as documented in the resident's note.  Chief Complaint(s): Shortness of breath  History - key components related to admission:  [name] is a 74 year old woman with a history of coronary artery disease status post a four-vessel bypass on 06/15/2014. Her postoperative course was relatively uneventful although approximately 2-3 weeks postoperatively she began to develop increasing shortness of breath. Diuretics were started including metolazone with no improvement in her symptoms. She therefore presented to the emergency department and was found to have bilateral pleural effusions larger than on 07/15/2014. She was therefore admitted for further evaluation and therapeutic thoracenteses. Workup thus far has included an echocardiogram which showed a normal left ventricular ejection fraction and grade 1 diastolic dysfunction. A pulmonary angiogram was unremarkable for any thromboemboli but notable for bilateral pleural effusions. She underwent a right-sided thoracentesis with symptomatic relief. Unfortunately, the fluid was not sent for studies.   Physical Exam - key components related to admission:  Filed Vitals:   07/27/14 1032 07/27/14 1105 07/27/14 1125 07/27/14 1406  BP: 126/80 144/56 148/53 139/55  Pulse:    100  Temp:    98.2 F (36.8 C)  TempSrc:    Oral  Resp:    18  Height:      Weight:      SpO2:    93%   Gen:  Well-developed, well-nourished, woman sitting comfortably in a chair postthoracentesis in no acute distress. Lungs: Bibasilar inspiratory crackles with decreased breath sounds left greater than right. There were no wheezes. No pleural friction rubs could be appreciated. Heart: Regular  rate and rhythm without murmurs, rubs, or gallops. Extremities:  ilateral lower extremity edema greater on the site of the vein harvest from just over a month prior. Extremities were well perfused on palpation.  Lab results:  Basic Metabolic Panel:  Recent Labs  07/26/14 1015 07/27/14 0457  NA 134* 136  K 4.0 4.3  CL 101 100  CO2 25 32  GLUCOSE 108* 106*  BUN 27* 27*  CREATININE 1.50* 1.62*  CALCIUM 8.9 8.8  MG  --  1.6  PHOS  --  4.6   Liver Function Tests:  Recent Labs  07/27/14 0457  AST 17  ALT 13  ALKPHOS 60  BILITOT 0.4  PROT 5.3*  ALBUMIN 2.9*   CBC:  Recent Labs  07/26/14 1015 07/27/14 0457  WBC 4.0 3.4*  NEUTROABS 2.6  --   HGB 9.9* 9.1*  HCT 30.3* 27.7*  MCV 84.6 83.9  PLT 190 171   CBG:  Recent Labs  07/26/14 1455 07/26/14 1606 07/26/14 2136 07/27/14 0617 07/27/14 1218 07/27/14 1654  GLUCAP 112* 167* 149* 112* 107* 173*   Anemia Panel:  Recent Labs  07/27/14 0457  VITAMINB12 553  FOLATE >20.0  FERRITIN 180  TIBC 216*  IRON 28*  RETICCTPCT 2.8   Coagulation:  Recent Labs  07/27/14 0457  INR 1.10   Urinalysis:  Unremarkable  Misc. Labs:  BNP 97  Imaging results:  Dg Chest 1 View  07/27/2014   CLINICAL DATA:  Right thoracentesis.  EXAM: CHEST - 1 VIEW  COMPARISON:  07/27/2014 at 7:33 a.m.  FINDINGS: Decreased right pleural effusion post thoracentesis. No air leak or re-expansion pulmonary edema. Unchanged large left pleural  effusion with extensive atelectasis, reference chest CT from 1 day prior. No pulmonary edema.  Stable cardiomegaly and aortic contours. The patient is status post CABG.  IMPRESSION: No evidence of complication post right thoracentesis.   Electronically Signed   By: Jorje Guild M.D.   On: 07/27/2014 12:08   X-ray Chest Pa And Lateral  07/27/2014   CLINICAL DATA:  74 year old female with shortness of breath and clinical history of recent heart surgery.  EXAM: CHEST  2 VIEW  COMPARISON:  Prior chest  x-ray and chest CT scan 07/26/2014  FINDINGS: Stable cardiac and mediastinal contours. Patient is status post median sternotomy with evidence of multivessel CABG. There has been no significant interval change in the appearance of the chest. Persistent bilateral layering pleural effusions and associated bibasilar atelectasis. No overt pulmonary edema. No pneumothorax. Incompletely imaged anterior cervical fusion hardware. No acute osseous abnormality.  IMPRESSION: 1. Stable bilateral layering pleural effusions and associated bibasilar atelectasis. 2. No evidence of pulmonary edema/CHF or other significant interval change.   Electronically Signed   By: Jacqulynn Cadet M.D.   On: 07/27/2014 07:55   Ct Angio Chest Pe W/cm &/or Wo Cm  07/26/2014   CLINICAL DATA:  Chest pain, shortness of breath.  EXAM: CT ANGIOGRAPHY CHEST WITH CONTRAST  TECHNIQUE: Multidetector CT imaging of the chest was performed using the standard protocol during bolus administration of intravenous contrast. Multiplanar CT image reconstructions and MIPs were obtained to evaluate the vascular anatomy.  CONTRAST:  76mL OMNIPAQUE IOHEXOL 350 MG/ML SOLN  COMPARISON:  Chest radiograph of same day.  FINDINGS: Large bilateral pleural effusions are noted with associated atelectasis of both lower lobes. No pneumothorax is noted. Mild opacity is noted in the left upper lobe concerning for subsegmental atelectasis. Status post coronary artery bypass graft. There is no evidence of thoracic aortic dissection. Ascending aorta is mildly dilated at 3.8 cm. There is no definite evidence of pulmonary embolus. No significant mediastinal mass or adenopathy is noted. No significant osseous abnormality is noted.  Review of the MIP images confirms the above findings.  IMPRESSION: No definite evidence of pulmonary embolus.  Large bilateral pleural effusions are noted with associated atelectasis of both lower lobes.  Mild dilatation of ascending thoracic aorta measured  at 3.8 cm.   Electronically Signed   By: Sabino Dick M.D.   On: 07/26/2014 14:13   Dg Chest Port 1 View  07/26/2014   CLINICAL DATA:  SOB x 3 weeks; worsening greatly in the last 2 days. Hx of CABG June 16, 2014.  EXAM: PORTABLE CHEST - 1 VIEW  COMPARISON:  07/15/2014  FINDINGS: Bilateral effusions and lung base opacity, most likely atelectasis, have mildly increased from the prior exam. No pulmonary edema.  Changes from CABG surgery are stable. Cardiac silhouette is mostly obscured by the lung base opacity. No mediastinal or hilar masses.  No pneumothorax.  IMPRESSION: 1. Mild increase in bilateral pleural effusions and associated lung base atelectasis. No pulmonary edema.   Electronically Signed   By: Lajean Manes M.D.   On: 07/26/2014 11:03   US Thoracentesis Asp Pleural Space W/img Guide  07/27/2014   INDICATION: Symptomatic bilateral pleural effusions, request for thoracentesis.  EXAM: US THORACENTESIS ASP PLEURAL SPACE W/IMG GUIDE  COMPARISON:  07/26/14.  MEDICATIONS: None  COMPLICATIONS: None immediate  TECHNIQUE: Informed written consent was obtained from the patient after a discussion of the risks, benefits and alternatives to treatment. A timeout was performed prior to the initiation of the procedure.  Initial ultrasound scanning demonstrates a right pleural effusion. The lower chest was prepped and draped in the usual sterile fashion. 1% lidocaine was used for local anesthesia.  Under direct ultrasound guidance, a 19 gauge, 10-cm, Yueh catheter was introduced. An ultrasound image was saved for documentation purposes. The thoracentesis was performed. The catheter was removed and a dressing was applied. The patient tolerated the procedure well without immediate post procedural complication. The patient was escorted to have an upright chest radiograph.  FINDINGS: A total of approximately 1 liters of serous fluid was removed.  IMPRESSION: Successful ultrasound-guided right sided thoracentesis  yielding 1 liters of pleural fluid.  Read By:  Tsosie Billing PA-C   Electronically Signed   By: Daryll Brod M.D.   On: 07/27/2014 14:32   Other results:  EKG: No acute changes  Assessment & Plan by Problem:  Ms. Kast is a 74 year old woman who presents with increasing dyspnea and was found to have large bilateral pleural effusions despite attempts at diuresis. The cause of the pleural effusions is unclear. One may presumed these were transudative secondary to heart failure post CABG but her BNP was normal and her echocardiogram demonstrated only grade 1 diastolic dysfunction with a normal left ventricular ejection fraction. Thus, heart failure seems to be an unlikely cause of her effusions. The differential also includes a post pericardiotomy syndrome with bilateral effusions, although she is not complained of any fevers or pleuritic chest pain. Unfortunately, the fluid was not sent for studies which would've been helpful if it was transudative versus exudative.  1) Bilateral large pleural effusions of unclear etiology: She is scheduled to undergo a left-sided thoracentesis and we will be sure to assess the fluid with regards to white count and differential, total protein, LDH, triglycerides, and Gram stain. It is hoped that further studies of the pleural fluid will allow [place] to better narrow the differential diagnosis for the cause of these effusions. We will continue with the diuretics although be very careful not to cause a prerenal azotemia given the lack of heart failure both by BNP and echocardiogram.

## 2014-07-27 NOTE — Progress Notes (Signed)
Utilization Review Completed.Donne Anon T2/06/2014

## 2014-07-27 NOTE — Progress Notes (Signed)
Subjective:    Currently, the patient reports feeling much better after getting her thoracentesis this morning.  She is breathing much more comfortably, but still has some residual shortness of breath.  Little urine output after Lasix.  She denies chest pain currently.  Interval Events: -R thoracentesis with removal of 1L serous fluid. -Post CXR with no evidence of pneumothorax. -Lower extremity duplex with no evidence of DVT. -Oxygen weaned to 2L after thoracentesis.   Objective:    Vital Signs:   Temp:  [98.2 F (36.8 C)-98.5 F (36.9 C)] 98.2 F (36.8 C) (02/01 1406) Pulse Rate:  [88-100] 100 (02/01 1406) Resp:  [18-22] 18 (02/01 1406) BP: (126-148)/(53-80) 139/55 mmHg (02/01 1406) SpO2:  [93 %-99 %] 93 % (02/01 1406) Weight:  [194 lb (87.998 kg)] 194 lb (87.998 kg) (01/31 1601) Last BM Date: 07/27/14  24-hour weight change: Weight change:   Intake/Output:   Intake/Output Summary (Last 24 hours) at 07/27/14 1532 Last data filed at 07/27/14 0423  Gross per 24 hour  Intake      0 ml  Output    650 ml  Net   -650 ml      Physical Exam: General: Vital signs reviewed and noted. Well-developed, well-nourished, in no acute distress; alert, appropriate and cooperative throughout examination.  Lungs:  Breathing comfortably.  Bilateral crackles with decreased breath sounds at L lung base.  Heart: RRR. S1 and S2 normal without gallop, murmur, or rubs.  Abdomen:  BS normoactive. Soft, Nondistended, non-tender.  No masses or organomegaly.  Extremities: 2+ pitting edema bilaterally.     Labs:  Basic Metabolic Panel:  Recent Labs Lab 07/26/14 1015 07/27/14 0457  NA 134* 136  K 4.0 4.3  CL 101 100  CO2 25 32  GLUCOSE 108* 106*  BUN 27* 27*  CREATININE 1.50* 1.62*  CALCIUM 8.9 8.8  MG  --  1.6  PHOS  --  4.6    Liver Function Tests:  Recent Labs Lab 07/27/14 0457  AST 17  ALT 13  ALKPHOS 60  BILITOT 0.4  PROT 5.3*  ALBUMIN 2.9*   CBC:  Recent  Labs Lab 07/26/14 1015 07/27/14 0457  WBC 4.0 3.4*  NEUTROABS 2.6  --   HGB 9.9* 9.1*  HCT 30.3* 27.7*  MCV 84.6 83.9  PLT 190 171    CBG:  Recent Labs Lab 07/26/14 1455 07/26/14 1606 07/26/14 2136 07/27/14 0617 07/27/14 1218  GLUCAP 112* 167* 149* 112* 107*    Microbiology: Results for orders placed or performed during the hospital encounter of 07/26/14  MRSA PCR Screening     Status: None   Collection Time: 07/26/14  7:04 PM  Result Value Ref Range Status   MRSA by PCR NEGATIVE NEGATIVE Final    Comment:        The GeneXpert MRSA Assay (FDA approved for NASAL specimens only), is one component of a comprehensive MRSA colonization surveillance program. It is not intended to diagnose MRSA infection nor to guide or monitor treatment for MRSA infections.     Coagulation Studies:  Recent Labs  07/27/14 0457  LABPROT 14.3  INR 1.10     Imaging: Dg Chest 1 View  07/27/2014   CLINICAL DATA:  Right thoracentesis.  EXAM: CHEST - 1 VIEW  COMPARISON:  07/27/2014 at 7:33 a.m.  FINDINGS: Decreased right pleural effusion post thoracentesis. No air leak or re-expansion pulmonary edema. Unchanged large left pleural effusion with extensive atelectasis, reference chest CT from 1 day prior. No  pulmonary edema.  Stable cardiomegaly and aortic contours. The patient is status post CABG.  IMPRESSION: No evidence of complication post right thoracentesis.   Electronically Signed   By: Jorje Guild M.D.   On: 07/27/2014 12:08   X-ray Chest Pa And Lateral  07/27/2014   CLINICAL DATA:  74 year old female with shortness of breath and clinical history of recent heart surgery.  EXAM: CHEST  2 VIEW  COMPARISON:  Prior chest x-ray and chest CT scan 07/26/2014  FINDINGS: Stable cardiac and mediastinal contours. Patient is status post median sternotomy with evidence of multivessel CABG. There has been no significant interval change in the appearance of the chest. Persistent bilateral layering  pleural effusions and associated bibasilar atelectasis. No overt pulmonary edema. No pneumothorax. Incompletely imaged anterior cervical fusion hardware. No acute osseous abnormality.  IMPRESSION: 1. Stable bilateral layering pleural effusions and associated bibasilar atelectasis. 2. No evidence of pulmonary edema/CHF or other significant interval change.   Electronically Signed   By: Jacqulynn Cadet M.D.   On: 07/27/2014 07:55   Ct Angio Chest Pe W/cm &/or Wo Cm  07/26/2014   CLINICAL DATA:  Chest pain, shortness of breath.  EXAM: CT ANGIOGRAPHY CHEST WITH CONTRAST  TECHNIQUE: Multidetector CT imaging of the chest was performed using the standard protocol during bolus administration of intravenous contrast. Multiplanar CT image reconstructions and MIPs were obtained to evaluate the vascular anatomy.  CONTRAST:  66mL OMNIPAQUE IOHEXOL 350 MG/ML SOLN  COMPARISON:  Chest radiograph of same day.  FINDINGS: Large bilateral pleural effusions are noted with associated atelectasis of both lower lobes. No pneumothorax is noted. Mild opacity is noted in the left upper lobe concerning for subsegmental atelectasis. Status post coronary artery bypass graft. There is no evidence of thoracic aortic dissection. Ascending aorta is mildly dilated at 3.8 cm. There is no definite evidence of pulmonary embolus. No significant mediastinal mass or adenopathy is noted. No significant osseous abnormality is noted.  Review of the MIP images confirms the above findings.  IMPRESSION: No definite evidence of pulmonary embolus.  Large bilateral pleural effusions are noted with associated atelectasis of both lower lobes.  Mild dilatation of ascending thoracic aorta measured at 3.8 cm.   Electronically Signed   By: Sabino Dick M.D.   On: 07/26/2014 14:13   Dg Chest Port 1 View  07/26/2014   CLINICAL DATA:  SOB x 3 weeks; worsening greatly in the last 2 days. Hx of CABG June 16, 2014.  EXAM: PORTABLE CHEST - 1 VIEW  COMPARISON:   07/15/2014  FINDINGS: Bilateral effusions and lung base opacity, most likely atelectasis, have mildly increased from the prior exam. No pulmonary edema.  Changes from CABG surgery are stable. Cardiac silhouette is mostly obscured by the lung base opacity. No mediastinal or hilar masses.  No pneumothorax.  IMPRESSION: 1. Mild increase in bilateral pleural effusions and associated lung base atelectasis. No pulmonary edema.   Electronically Signed   By: Lajean Manes M.D.   On: 07/26/2014 11:03   US Thoracentesis Asp Pleural Space W/img Guide  07/27/2014   INDICATION: Symptomatic bilateral pleural effusions, request for thoracentesis.  EXAM: US THORACENTESIS ASP PLEURAL SPACE W/IMG GUIDE  COMPARISON:  07/26/14.  MEDICATIONS: None  COMPLICATIONS: None immediate  TECHNIQUE: Informed written consent was obtained from the patient after a discussion of the risks, benefits and alternatives to treatment. A timeout was performed prior to the initiation of the procedure.  Initial ultrasound scanning demonstrates a right pleural effusion. The lower chest  was prepped and draped in the usual sterile fashion. 1% lidocaine was used for local anesthesia.  Under direct ultrasound guidance, a 19 gauge, 10-cm, Yueh catheter was introduced. An ultrasound image was saved for documentation purposes. The thoracentesis was performed. The catheter was removed and a dressing was applied. The patient tolerated the procedure well without immediate post procedural complication. The patient was escorted to have an upright chest radiograph.  FINDINGS: A total of approximately 1 liters of serous fluid was removed.  IMPRESSION: Successful ultrasound-guided right sided thoracentesis yielding 1 liters of pleural fluid.  Read By:  Tsosie Billing PA-C   Electronically Signed   By: Daryll Brod M.D.   On: 07/27/2014 14:32       Medications:    Infusions:    Scheduled Medications: . amLODipine  10 mg Oral Daily  . aspirin EC  325 mg Oral Daily   . atorvastatin  40 mg Oral q1800  . feeding supplement (GLUCERNA SHAKE)  237 mL Oral Daily  . ferrous Q000111Q C-folic acid  1 capsule Oral Q breakfast  . [START ON 07/28/2014] furosemide  80 mg Intravenous Daily  . heparin  5,000 Units Subcutaneous 3 times per day  . hydrALAZINE  75 mg Oral TID  . insulin aspart  0-15 Units Subcutaneous TID WC  . insulin glargine  10 Units Subcutaneous QHS  . primidone  50 mg Oral Daily  . traZODone  100 mg Oral QHS  . zinc oxide   Topical TID    PRN Medications: acetaminophen, magnesium hydroxide, traMADol   Assessment/ Plan:    Principal Problem:   Pleural effusion Active Problems:   Chronic kidney disease (CKD), stage III (moderate)   HTN (hypertension)   Diabetes mellitus type 2 in obese   S/P CABG x 4   Acute on chronic combined systolic and diastolic heart failure   Normocytic anemia  #Bilateral pleural effusions Likely secondary to new onset heart failure vs. Post-CABG thoracotomy syndrome.  BNP normal, but may be low due to obesity.  Breathing improved after R thoracentesis, yielded serous fluid.  Will continue to diurese and assess heart function.  BP elevated, so would benefit from reduced afterload.  Should be on ACE for survival benefit. -Repeat echo. -L thoracentesis tomorrow. -Increase furosemide to 80 mg IV daily. -Continue to work with PT and OT. -Follow up thoracentesis fluid analysis. -Start lisinopril 5 mg daily. -Follow CT surgery recs.  #CAD s/p CABG Cannot tolerate beta blockers. -Continue aspirin 325 mg daily. -Continue atorvastatin.  #HTN Slightly elevates, will lower to reduce afterload. -Continue amlodipine 10 mg daily, hydralazine 75 mg TID. -Start lisinopril 5 mg daily.  #DM2 -CBGs ACHS. -SSI-M. -Continue Lantus 10 units QHS  #Fibromyalgia -Continue primidone and tramadol 50 mg q6h PRN.  #Constipation -Dulcolax. -Milk of magnesia.   DVT PPX - heparin  CODE STATUS -  Full  CONSULTS PLACED - Cardiothoracic surgery  DISPO - Disposition is deferred at this time, awaiting improvement of pleural effusions.   Anticipated discharge in approximately 2-3 day(s).   The patient does not have a current PCP (No primary care provider on file.) and does need an [facility] hospital follow-up appointment after discharge.    Is the [facility] hospital follow-up appointment a one-time only appointment? yes.  Does the patient have transportation limitations that hinder transportation to clinic appointments? yes   SERVICE NEEDED AT [facility]         Y = Yes, Blank =  No PT: Home health  OT: Home health  RN:   Equipment:   Other:      Length of Stay: 1 day(s)   Signed: Arman Filter, MD  PGY-1, Internal Medicine Resident Pager: [phone] (7AM-5PM) 07/27/2014, 3:32 PM

## 2014-07-27 NOTE — Procedures (Signed)
Successful US guided right thoracentesis. Yielded 1 liter of serous fluid. Pt tolerated procedure well. No immediate complications.  Specimen was not sent for labs. CXR ordered.  Tsosie Billing D PA-C 07/27/2014 11:31 AM

## 2014-07-27 NOTE — Progress Notes (Signed)
  Echocardiogram 2D Echocardiogram has been performed.  Darlina Sicilian M 07/27/2014, 3:12 PM

## 2014-07-27 NOTE — Evaluation (Signed)
Occupational Therapy Evaluation Patient Details Name: Joyce [name] MRN: [id number] DOB: May 09, 1941 Today's Date: 07/27/2014    History of Present Illness 74 yo female who was in rehab s/p CABG and now returns to hospital from rehab with B pleural effusions.  Pt with thoracentesis scheduled for today.     Clinical Impression   Pt admitted for the above diagnosis and has the deficits listed below. Pt would benefit from cont OT to increase efficiency with adls and review energy conservation techniques so she can return straight home from the hospital.    Follow Up Recommendations  Home health OT;Supervision - Intermittent    Equipment Recommendations  None recommended by OT    Recommendations for Other Services       Precautions / Restrictions Precautions Precautions: Sternal Precaution Comments: Reviewed precautions Restrictions Weight Bearing Restrictions: Yes Other Position/Activity Restrictions: sternal precautions.      Mobility Bed Mobility               General bed mobility comments: up in chair and returned to chair  Transfers Overall transfer level: Needs assistance Equipment used: 1 person hand held assist Transfers: Sit to/from Stand Sit to Stand: Supervision         General transfer comment: Pt did well with following sternal precautions.    Balance Overall balance assessment: Needs assistance Sitting-balance support: No upper extremity supported;Feet supported Sitting balance-Leahy Scale: Good     Standing balance support: Bilateral upper extremity supported;During functional activity Standing balance-Leahy Scale: Good Standing balance comment: Pt could stand and walk some on her own with no LOB.  Pt fatigues quickly needing rest breaks.                            ADL Overall ADL's : Needs assistance/impaired Eating/Feeding: Independent;Sitting   Grooming: Wash/dry hands;Wash/dry face;Oral care;Min guard;Standing   Upper  Body Bathing: Set up;Sitting   Lower Body Bathing: Supervison/ safety;Sit to/from stand Lower Body Bathing Details (indicate cue type and reason): pt limited by fatigue Upper Body Dressing : Set up;Sitting   Lower Body Dressing: Min guard;Sit to/from stand Lower Body Dressing Details (indicate cue type and reason): min guard for standing due to fatigue. Toilet Transfer: Supervision/safety;Comfort height toilet;Ambulation Toilet Transfer Details (indicate cue type and reason): pt walked to [facility] wo assistive device with S. Toileting- Clothing Manipulation and Hygiene: Supervision/safety;Sit to/from stand       Functional mobility during ADLs: Supervision/safety General ADL Comments: Pt does very well with adls and follows precautions well. Pt most limited with activity tolerance during adls.  Pt needs rest breaks and gets very SOB with activity.     Vision                     Perception     Praxis      Pertinent Vitals/Pain Pain Assessment: No/denies pain     Hand Dominance Right   Extremity/Trunk Assessment Upper Extremity Assessment Upper Extremity Assessment: Overall WFL for tasks assessed (within limits of sternal precautions.)   Lower Extremity Assessment Lower Extremity Assessment: Defer to PT evaluation   Cervical / Trunk Assessment Cervical / Trunk Assessment: Normal   Communication Communication Communication: No difficulties   Cognition Arousal/Alertness: Awake/alert Behavior During Therapy: WFL for tasks assessed/performed Overall Cognitive Status: Within Functional Limits for tasks assessed                     General  Comments       Exercises       Shoulder Instructions      Home Living Family/patient expects to be discharged to:: Private residence Living Arrangements: Alone Available Help at Discharge: Family;Available PRN/intermittently Type of Home: House Home Access: Stairs to enter [employer] of Steps:  3 Entrance Stairs-Rails: Right Home Layout: Two level;Able to live on main level with bedroom/bathroom     Bathroom Shower/Tub: Walk-in shower;Door   [employer] Toilet: Handicapped height Bathroom Accessibility: Yes How Accessible: Accessible via walker Home Equipment: [place] - 2 wheels;Cane - single point;Bedside commode;Shower seat          Prior Functioning/Environment Level of Independence: Independent        Comments: was at rehab for last month not using AD, riding bike and ambulating    OT Diagnosis: Generalized weakness   OT Problem List: Decreased strength;Decreased activity tolerance;Decreased knowledge of use of DME or AE;Cardiopulmonary status limiting activity   OT Treatment/Interventions: Self-care/ADL training;Energy conservation;Therapeutic activities    OT Goals(Current goals can be found in the care plan section) Acute Rehab OT Goals Patient Stated Goal: to go straight home from here. OT Goal Formulation: With patient Time For Goal Achievement: 08/03/14 Potential to Achieve Goals: Good ADL Goals Additional ADL Goal #1: Pt will complete basic dressing/grooming in room with S. Additional ADL Goal #2: Pt will state 3 things she can do in her home environment to conserve energy during adls.  OT Frequency: Min 2X/week   Barriers to D/C:    family lives next door and sister can come stay for a few days.       Co-evaluation              End of Session Nurse Communication: Mobility status  Activity Tolerance: Patient limited by fatigue Patient left: in chair;with call bell/phone within reach   Time: 0927-0942 OT Time Calculation (min): 15 min Charges:  OT General Charges $OT Visit: 1 Procedure OT Evaluation $Initial OT Evaluation Tier I: 1 Procedure G-Codes:    Glenford Peers 05-Aug-2014, 9:55 AM  [phone]

## 2014-07-27 NOTE — Progress Notes (Addendum)
[address]       [place] [address]             [phone]          Subjective: Feels SOB  Objective: Vital signs in last 24 hours: Temp:  [98 F (36.7 C)-98.5 F (36.9 C)] 98.3 F (36.8 C) (02/01 0419) Pulse Rate:  [87-101] 88 (02/01 0419) Cardiac Rhythm:  [-] Normal sinus rhythm (01/31 0959) Resp:  [17-25] 18 (02/01 0419) BP: (121-147)/(52-90) 135/65 mmHg (02/01 0419) SpO2:  [91 %-100 %] 97 % (02/01 0419) Weight:  [194 lb (87.998 kg)] 194 lb (87.998 kg) (01/31 1601)  Hemodynamic parameters for last 24 hours:    Intake/Output from previous day: 01/31 0701 - 02/01 0700 In: -  Out: 1200 [Urine:1200] Intake/Output this shift:    General appearance: alert, cooperative and no distress Heart: regular rate and rhythm Lungs: dim in bases Abdomen: benign Extremities: + LE edema Wound: incis healed  Lab Results:  Recent Labs  07/26/14 1015 07/27/14 0457  WBC 4.0 3.4*  HGB 9.9* 9.1*  HCT 30.3* 27.7*  PLT 190 171   BMET:  Recent Labs  07/26/14 1015 07/27/14 0457  NA 134* 136  K 4.0 4.3  CL 101 100  CO2 25 32  GLUCOSE 108* 106*  BUN 27* 27*  CREATININE 1.50* 1.62*  CALCIUM 8.9 8.8    PT/INR:  Recent Labs  07/27/14 0457  LABPROT 14.3  INR 1.10   ABG    Component Value Date/Time   PHART 7.295* 06/15/2014 2013   HCO3 19.7* 06/15/2014 2013   TCO2 18 06/16/2014 1712   ACIDBASEDEF 6.0* 06/15/2014 2013   O2SAT 97.0 06/15/2014 2013   CBG (last 3)   Recent Labs  07/26/14 1606 07/26/14 2136 07/27/14 0617  GLUCAP 167* 149* 112*    Meds Scheduled Meds: . amLODipine  10 mg Oral Daily  . aspirin EC  325 mg Oral Daily  . atorvastatin  40 mg Oral q1800  . feeding supplement (GLUCERNA SHAKE)  237 mL Oral Daily  . ferrous Q000111Q C-folic acid  1 capsule Oral Q breakfast  . furosemide  80 mg Intravenous Once  . heparin  5,000 Units Subcutaneous 3 times per day  . hydrALAZINE  75 mg Oral TID  . insulin  aspart  0-15 Units Subcutaneous TID WC  . insulin glargine  10 Units Subcutaneous QHS  . primidone  50 mg Oral Daily  . traZODone  100 mg Oral QHS  . zinc oxide   Topical TID   Continuous Infusions:  PRN Meds:.acetaminophen, traMADol  Xrays Ct Angio Chest Pe W/cm &/or Wo Cm  07/26/2014   CLINICAL DATA:  Chest pain, shortness of breath.  EXAM: CT ANGIOGRAPHY CHEST WITH CONTRAST  TECHNIQUE: Multidetector CT imaging of the chest was performed using the standard protocol during bolus administration of intravenous contrast. Multiplanar CT image reconstructions and MIPs were obtained to evaluate the vascular anatomy.  CONTRAST:  86mL OMNIPAQUE IOHEXOL 350 MG/ML SOLN  COMPARISON:  Chest radiograph of same day.  FINDINGS: Large bilateral pleural effusions are noted with associated atelectasis of both lower lobes. No pneumothorax is noted. Mild opacity is noted in the left upper lobe concerning for subsegmental atelectasis. Status post coronary artery bypass graft. There is no evidence of thoracic aortic dissection. Ascending aorta is mildly dilated at 3.8 cm. There is no definite evidence of pulmonary embolus. No significant mediastinal mass or adenopathy is noted. No significant osseous  abnormality is noted.  Review of the MIP images confirms the above findings.  IMPRESSION: No definite evidence of pulmonary embolus.  Large bilateral pleural effusions are noted with associated atelectasis of both lower lobes.  Mild dilatation of ascending thoracic aorta measured at 3.8 cm.   Electronically Signed   By: Sabino Dick M.D.   On: 07/26/2014 14:13   Dg Chest Port 1 View  07/26/2014   CLINICAL DATA:  SOB x 3 weeks; worsening greatly in the last 2 days. Hx of CABG June 16, 2014.  EXAM: PORTABLE CHEST - 1 VIEW  COMPARISON:  07/15/2014  FINDINGS: Bilateral effusions and lung base opacity, most likely atelectasis, have mildly increased from the prior exam. No pulmonary edema.  Changes from CABG surgery are stable.  Cardiac silhouette is mostly obscured by the lung base opacity. No mediastinal or hilar masses.  No pneumothorax.  IMPRESSION: 1. Mild increase in bilateral pleural effusions and associated lung base atelectasis. No pulmonary edema.   Electronically Signed   By: Lajean Manes M.D.   On: 07/26/2014 11:03    Assessment/Plan:  1 stable but needs Bilat thoaracentesis 2 + constipation - add MOM 3 other as per primary   LOS: 1 day    Joyce [name] 07/27/2014  Currently in radiology for [place] directed thoracentesis\ Will review 2 D echo and follow up CXR BNP normal, EKG NSR  patient examined and medical record reviewed,agree with above note. Joyce [name] 07/27/2014

## 2014-07-28 ENCOUNTER — Inpatient Hospital Stay (HOSPITAL_COMMUNITY): Payer: Medicare Other

## 2014-07-28 ENCOUNTER — Ambulatory Visit: Payer: Medicare Other | Admitting: Cardiology

## 2014-07-28 DIAGNOSIS — D72819 Decreased white blood cell count, unspecified: Secondary | ICD-10-CM | POA: Diagnosis present

## 2014-07-28 LAB — CBC
HCT: 28.1 % — ABNORMAL LOW (ref 36.0–46.0)
Hemoglobin: 9.4 g/dL — ABNORMAL LOW (ref 12.0–15.0)
MCH: 28.7 pg (ref 26.0–34.0)
MCHC: 33.5 g/dL (ref 30.0–36.0)
MCV: 85.7 fL (ref 78.0–100.0)
Platelets: 175 10*3/uL (ref 150–400)
RBC: 3.28 MIL/uL — AB (ref 3.87–5.11)
RDW: 14.1 % (ref 11.5–15.5)
WBC: 3.4 10*3/uL — ABNORMAL LOW (ref 4.0–10.5)

## 2014-07-28 LAB — GLUCOSE, CAPILLARY
GLUCOSE-CAPILLARY: 144 mg/dL — AB (ref 70–99)
GLUCOSE-CAPILLARY: 124 mg/dL — AB (ref 70–99)
Glucose-Capillary: 142 mg/dL — ABNORMAL HIGH (ref 70–99)
Glucose-Capillary: 116 mg/dL — ABNORMAL HIGH (ref 70–99)

## 2014-07-28 LAB — LACTATE DEHYDROGENASE: LDH: 119 U/L (ref 94–250)

## 2014-07-28 LAB — BASIC METABOLIC PANEL
Anion gap: 6 (ref 5–15)
BUN: 30 mg/dL — ABNORMAL HIGH (ref 6–23)
CHLORIDE: 100 mmol/L (ref 96–112)
CO2: 28 mmol/L (ref 19–32)
Calcium: 8.2 mg/dL — ABNORMAL LOW (ref 8.4–10.5)
Creatinine, Ser: 1.82 mg/dL — ABNORMAL HIGH (ref 0.50–1.10)
GFR, EST AFRICAN AMERICAN: 31 mL/min — AB (ref >90)
GFR, EST NON AFRICAN AMERICAN: 27 mL/min — AB (ref >90)
Glucose, Bld: 115 mg/dL — ABNORMAL HIGH (ref 70–99)
POTASSIUM: 4.2 mmol/L (ref 3.5–5.1)
Sodium: 134 mmol/L — ABNORMAL LOW (ref 135–145)

## 2014-07-28 LAB — PROTEIN, TOTAL: TOTAL PROTEIN: 4.8 g/dL — AB (ref 6.0–8.3)

## 2014-07-28 LAB — PROTEIN, BODY FLUID: Total protein, fluid: 3.7 g/dL

## 2014-07-28 LAB — LACTATE DEHYDROGENASE, PLEURAL OR PERITONEAL FLUID: LD FL: 98 U/L — AB (ref 3–23)

## 2014-07-28 LAB — BODY FLUID CELL COUNT WITH DIFFERENTIAL
Eos, Fluid: 0 %
Lymphs, Fluid: 81 %
Monocyte-Macrophage-Serous Fluid: 13 % — ABNORMAL LOW (ref 50–90)
NEUTROPHIL FLUID: 6 % (ref 0–25)
Total Nucleated Cell Count, Fluid: 1326 cu mm — ABNORMAL HIGH (ref 0–1000)

## 2014-07-28 LAB — GLUCOSE, SEROUS FLUID: Glucose, Fluid: 137 mg/dL

## 2014-07-28 LAB — MAGNESIUM: MAGNESIUM: 2 mg/dL (ref 1.5–2.5)

## 2014-07-28 MED ORDER — IBUPROFEN 800 MG PO TABS
800.0000 mg | ORAL_TABLET | Freq: Three times a day (TID) | ORAL | Status: DC
  Administered 2014-07-28 – 2014-07-29 (×2): 800 mg via ORAL
  Filled 2014-07-28 (×5): qty 1

## 2014-07-28 MED ORDER — POLYETHYLENE GLYCOL 3350 17 G PO PACK
17.0000 g | PACK | Freq: Every day | ORAL | Status: DC
  Administered 2014-07-28 – 2014-07-31 (×3): 17 g via ORAL
  Filled 2014-07-28 (×4): qty 1

## 2014-07-28 MED ORDER — GLYCERIN (LAXATIVE) 2.1 G RE SUPP
1.0000 | Freq: Once | RECTAL | Status: DC
  Filled 2014-07-28: qty 1

## 2014-07-28 MED ORDER — LIDOCAINE HCL (PF) 1 % IJ SOLN
INTRAMUSCULAR | Status: AC
  Filled 2014-07-28: qty 10

## 2014-07-28 MED ORDER — LACTULOSE 10 GM/15ML PO SOLN
30.0000 g | Freq: Every day | ORAL | Status: DC
  Administered 2014-07-29 – 2014-07-31 (×2): 30 g via ORAL
  Filled 2014-07-28 (×4): qty 45

## 2014-07-28 MED ORDER — LACTULOSE 10 GM/15ML PO SOLN
20.0000 g | Freq: Every day | ORAL | Status: DC | PRN
  Filled 2014-07-28: qty 30

## 2014-07-28 NOTE — Progress Notes (Signed)
Subjective:    She reports significant improvement of her breathing. She is having some constipation with last bowel movement 4 days ago. She says she received glycerin enema on Monday which gave her partial relief, but she continues to feel constipated. She worked with physical therapy today and was able to ambulate in the hallway with assistance.  Interval Events: -Repeat chest x-ray this morning unchanged from yesterday post-procedure.  -Creatinine slightly increased after IV Lasix. -Echo with normal EF and grade 1 diastolic dysfunction.    Objective:    Vital Signs:   Temp:  [98.3 F (36.8 C)-98.5 F (36.9 C)] 98.3 F (36.8 C) (02/02 0422) Pulse Rate:  [91-102] 91 (02/02 0422) Resp:  [18] 18 (02/02 0422) BP: (103-138)/(47-74) 103/74 mmHg (02/02 1315) SpO2:  [94 %-96 %] 96 % (02/02 0422) Last BM Date: 07/27/14  24-hour weight change: Weight change:   Intake/Output:   Intake/Output Summary (Last 24 hours) at 07/28/14 1420 Last data filed at 07/28/14 0900  Gross per 24 hour  Intake    720 ml  Output    300 ml  Net    420 ml      Physical Exam: General: Vital signs reviewed and noted. Well-developed, well-nourished, in no acute distress; alert, appropriate and cooperative throughout examination.  Lungs:  Breathing comfortably.  Bilateral crackles with decreased breath sounds at L lung base.  Heart: RRR. S1 and S2 normal without gallop, murmur, or rubs.  Abdomen:  BS normoactive. Soft, Nondistended, non-tender.  No masses or organomegaly.  Extremities: Improved lower extremity edema.      Labs:  Basic Metabolic Panel:  Recent Labs Lab 07/26/14 1015 07/27/14 0457 07/28/14 0415  NA 134* 136 134*  K 4.0 4.3 4.2  CL 101 100 100  CO2 25 32 28  GLUCOSE 108* 106* 115*  BUN 27* 27* 30*  CREATININE 1.50* 1.62* 1.82*  CALCIUM 8.9 8.8 8.2*  MG  --  1.6 2.0  PHOS  --  4.6  --     Liver Function Tests:  Recent Labs Lab 07/27/14 0457 07/28/14 0415  AST 17   --   ALT 13  --   ALKPHOS 60  --   BILITOT 0.4  --   PROT 5.3* 4.8*  ALBUMIN 2.9*  --    CBC:  Recent Labs Lab 07/26/14 1015 07/27/14 0457 07/28/14 0415  WBC 4.0 3.4* 3.4*  NEUTROABS 2.6  --   --   HGB 9.9* 9.1* 9.4*  HCT 30.3* 27.7* 28.1*  MCV 84.6 83.9 85.7  PLT 190 171 175    CBG:  Recent Labs Lab 07/27/14 1218 07/27/14 1654 07/27/14 2135 07/28/14 0630 07/28/14 1116  GLUCAP 107* 173* 54 142* 116*    Microbiology: Results for orders placed or performed during the hospital encounter of 07/26/14  MRSA PCR Screening     Status: None   Collection Time: 07/26/14  7:04 PM  Result Value Ref Range Status   MRSA by PCR NEGATIVE NEGATIVE Final    Comment:        The GeneXpert MRSA Assay (FDA approved for NASAL specimens only), is one component of a comprehensive MRSA colonization surveillance program. It is not intended to diagnose MRSA infection nor to guide or monitor treatment for MRSA infections.     Coagulation Studies:  Recent Labs  07/27/14 0457  LABPROT 14.3  INR 1.10     Imaging: Dg Chest 1 View  07/28/2014   CLINICAL DATA:  Post left-sided thoracentesis.  EXAM:  CHEST - 1 VIEW  COMPARISON:  07/28/2014 at 6:12 a.m.  FINDINGS: Decreased left pleural effusion post thoracentesis. No postoperative air leak.  Unchanged small to moderate right pleural effusion from earlier today. Bibasilar atelectasis is also unchanged. Stable heart size and mediastinal contours post CABG. No pulmonary edema noted in the upper lungs.  IMPRESSION: No postoperative pneumothorax.   Electronically Signed   By: Jorje Guild M.D.   On: 07/28/2014 13:53   Dg Chest 1 View  07/27/2014   CLINICAL DATA:  Right thoracentesis.  EXAM: CHEST - 1 VIEW  COMPARISON:  07/27/2014 at 7:33 a.m.  FINDINGS: Decreased right pleural effusion post thoracentesis. No air leak or re-expansion pulmonary edema. Unchanged large left pleural effusion with extensive atelectasis, reference chest CT from 1  day prior. No pulmonary edema.  Stable cardiomegaly and aortic contours. The patient is status post CABG.  IMPRESSION: No evidence of complication post right thoracentesis.   Electronically Signed   By: Jorje Guild M.D.   On: 07/27/2014 12:08   Dg Chest 2 View  07/28/2014   CLINICAL DATA:  Difficulty breathing  EXAM: CHEST  2 VIEW  COMPARISON:  July 27, 2014  FINDINGS: There is bibasilar airspace consolidation with bilateral effusions. Lungs elsewhere clear. The heart size and pulmonary vascularity are normal. No adenopathy. Patient is status post coronary artery bypass grafting. There is postoperative change in the lower cervical spine region. There is degenerative change in the lower thoracic and upper lumbar spine regions.  IMPRESSION: Bibasilar lung consolidations and small pleural effusions bilaterally. No pneumothorax. There is slightly greater consolidation in the right base compared to 1 day prior. Left hemithorax appears essentially stable.   Electronically Signed   By: Lowella Grip M.D.   On: 07/28/2014 07:40   X-ray Chest Pa And Lateral  07/27/2014   CLINICAL DATA:  74 year old female with shortness of breath and clinical history of recent heart surgery.  EXAM: CHEST  2 VIEW  COMPARISON:  Prior chest x-ray and chest CT scan 07/26/2014  FINDINGS: Stable cardiac and mediastinal contours. Patient is status post median sternotomy with evidence of multivessel CABG. There has been no significant interval change in the appearance of the chest. Persistent bilateral layering pleural effusions and associated bibasilar atelectasis. No overt pulmonary edema. No pneumothorax. Incompletely imaged anterior cervical fusion hardware. No acute osseous abnormality.  IMPRESSION: 1. Stable bilateral layering pleural effusions and associated bibasilar atelectasis. 2. No evidence of pulmonary edema/CHF or other significant interval change.   Electronically Signed   By: Jacqulynn Cadet M.D.   On: 07/27/2014  07:55   US Thoracentesis Asp Pleural Space W/img Guide  07/27/2014   INDICATION: Symptomatic bilateral pleural effusions, request for thoracentesis.  EXAM: US THORACENTESIS ASP PLEURAL SPACE W/IMG GUIDE  COMPARISON:  07/26/14.  MEDICATIONS: None  COMPLICATIONS: None immediate  TECHNIQUE: Informed written consent was obtained from the patient after a discussion of the risks, benefits and alternatives to treatment. A timeout was performed prior to the initiation of the procedure.  Initial ultrasound scanning demonstrates a right pleural effusion. The lower chest was prepped and draped in the usual sterile fashion. 1% lidocaine was used for local anesthesia.  Under direct ultrasound guidance, a 19 gauge, 10-cm, Yueh catheter was introduced. An ultrasound image was saved for documentation purposes. The thoracentesis was performed. The catheter was removed and a dressing was applied. The patient tolerated the procedure well without immediate post procedural complication. The patient was escorted to have an upright chest radiograph.  FINDINGS: A total of approximately 1 liters of serous fluid was removed.  IMPRESSION: Successful ultrasound-guided right sided thoracentesis yielding 1 liters of pleural fluid.  Read By:  Tsosie Billing PA-C   Electronically Signed   By: Daryll Brod M.D.   On: 07/27/2014 14:32       Medications:    Infusions:    Scheduled Medications: . amLODipine  10 mg Oral Daily  . aspirin EC  325 mg Oral Daily  . atorvastatin  40 mg Oral q1800  . feeding supplement (GLUCERNA SHAKE)  237 mL Oral Daily  . ferrous [id number] C-folic acid  1 capsule Oral Q breakfast  . Glycerin (Adult)  1 suppository Rectal Once  . heparin  5,000 Units Subcutaneous 3 times per day  . hydrALAZINE  75 mg Oral TID  . insulin aspart  0-15 Units Subcutaneous TID WC  . insulin glargine  10 Units Subcutaneous QHS  . lactulose  30 g Oral Daily  . lidocaine (PF)      . lisinopril  5 mg Oral Daily  .  polyethylene glycol  17 g Oral Daily  . primidone  50 mg Oral Daily  . traZODone  100 mg Oral QHS  . zinc oxide   Topical TID    PRN Medications: acetaminophen, lactulose, magnesium hydroxide, traMADol   Assessment/ Plan:    Principal Problem:   Pleural effusion Active Problems:   Chronic kidney disease (CKD), stage III (moderate)   HTN (hypertension)   Diabetes mellitus type 2 in obese   S/P CABG x 4   Acute on chronic combined systolic and diastolic heart failure   Normocytic anemia   Bilateral pleural effusion  #Bilateral pleural effusions Breathing improved after thoracentesis yesterday. Cardiac workup largely unremarkable, suggesting this is not due to heart failure. Most likely secondary to CABG a month and a half ago.  Fluid from thoracentesis yesterday was not sent for analysis. Will send fluid from today to help elucidate etiology. Furosemide is help with lower extremity edema, which she continues have low urine output. In addition, her creatinine is bumped slightl, suggesting she may be volume depleted.  -L thoracentesis today. -Central fluid for cytology, glucose, cell count, protein, LDH, pH, culture, triglycerides. -Follow-up post procedure x-ray. -Hold furosemide today. Plan to restart home furosemide 40 mg daily tomorrow.   #Constipation Reports no bowel movement in 4 days. Not on optimal management. She had partial relief following glycerin suppository previously. We'll repeat today. -Start Miralax daily.  -Continue milk of magnesia when necessary. -Glycerin suppository.  #CAD s/p CABG Cannot tolerate beta blockers. Should be on ACE inhibitor post MI. -Continue aspirin 325 mg daily. -Continue atorvastatin. -Continue lisinopril 5 mg daily.  #HTN Improved after starting lisinopril. -Continue amlodipine 10 mg daily, hydralazine 75 mg TID. -Continue lisinopril 5 mg daily.  #DM2 -CBGs ACHS. -SSI-M. -Continue Lantus 10 units QHS.  #Fibromyalgia -Continue  primidone and tramadol 50 mg q6h PRN.   DVT PPX - heparin  CODE STATUS - Full  CONSULTS PLACED - Cardiothoracic surgery  DISPO - Disposition is deferred at this time, awaiting improvement of pleural effusions.   Anticipated discharge in approximately 1-2 day(s).   The patient does not have a current PCP (No primary care provider on file.) and does need an [facility] hospital follow-up appointment after discharge.    Is the [facility] hospital follow-up appointment a one-time only appointment? yes.  Does the patient have transportation limitations that hinder transportation to clinic appointments? yes   SERVICE NEEDED  AT DISCHARGE - TO BE DETERMINED DURING HOSPITAL COURSE         Y = Yes, Blank = No PT: Home health  OT: Home health  RN:   Equipment:   Other:      Length of Stay: 2 day(s)   Signed: Arman Filter, MD  PGY-1, Internal Medicine Resident Pager: [phone] (7AM-5PM) 07/28/2014, 2:20 PM

## 2014-07-28 NOTE — Progress Notes (Signed)
Pt ambulated out of room down hallway to room 2w35; pt c/o SOB during ambulation; pt back to bed; call bell w/i reach; pt coughing after ambulation; will cont. To monitor.

## 2014-07-28 NOTE — Progress Notes (Signed)
Physical Therapy Treatment Patient Details Name: Joyce [name] MRN: [id number] DOB: 1941-01-13 Today's Date: 08-27-14    History of Present Illness 74 yo female who was in rehab s/p CABG and now returns to hospital from rehab with B pleural effusions.  Pt with thoracentesis 2/1 and 2nd planned 2/2    PT Comments    Pt with increased activity tolerance today. Pt able to maintain 90-92% on 2L throughout with HR 88-105. Pt educated for bil LE HEP and encouraged to increase mobility with nursing daily. Pt following precautions well except for scooting in chair.   Follow Up Recommendations  Home health PT;Supervision - Intermittent     Equipment Recommendations       Recommendations for Other Services       Precautions / Restrictions Precautions Precautions: Sternal    Mobility  Bed Mobility               General bed mobility comments: in chair on arrival  Transfers     Transfers: Sit to/from Stand Sit to Stand: Modified independent (Device/Increase time)            Ambulation/Gait Ambulation/Gait assistance: Supervision Ambulation Distance (Feet): 120 Feet Assistive device: None Gait Pattern/deviations: Step-through pattern;Decreased stride length;Trunk flexed     General Gait Details: cues for posture, looking up and breathing technique. Pt aware of fatigue and activity modification   Stairs            Wheelchair Mobility    Modified Rankin (Stroke Patients Only)       Balance                                    Cognition Arousal/Alertness: Awake/alert Behavior During Therapy: WFL for tasks assessed/performed Overall Cognitive Status: Within Functional Limits for tasks assessed                      Exercises General Exercises - Lower Extremity Long Arc Quad: AROM;Seated;Both;20 reps Hip Flexion/Marching: AROM;Seated;Both;20 reps Toe Raises: AROM;Seated;Both;20 reps Heel Raises: AROM;Seated;Both;20 reps     General Comments        Pertinent Vitals/Pain Pain Assessment: No/denies pain    Home Living                      Prior Function            PT Goals (current goals can now be found in the care plan section) Progress towards PT goals: Progressing toward goals    Frequency       PT Plan Current plan remains appropriate    Co-evaluation             End of Session Equipment Utilized During Treatment: Oxygen Activity Tolerance: Patient tolerated treatment well Patient left: in chair;with call bell/phone within reach     Time: [id number] PT Time Calculation (min) (ACUTE ONLY): 14 min  Charges:  $Gait Training: 8-22 mins                    G Codes:      Melford Aase Aug 27, 2014, 12:32 PM Elwyn Reach, [place]

## 2014-07-28 NOTE — Care Management Note (Signed)
    Page 1 of 2   07/31/2014     3:08:04 PM CARE MANAGEMENT NOTE 07/31/2014  Patient:  Joyce [name], Joyce [name]   Account Number:  [id number]  Date Initiated:  07/28/2014  Documentation initiated by:  Marvetta Gibbons  Subjective/Objective Assessment:   Pt admitted with pl. effusion- s/p thoracentesis     Action/Plan:   PTA pt was at [facility]- wants to return home with [facility] PT/OT evals- recommendations for [facility]   Anticipated DC Date:  07/30/2014   Anticipated DC Plan:  [place]  CM consult      [facility] Choice  HOME HEALTH   Choice offered to / List presented to:  C-1 Patient        [place] arranged  [facility]   Status of service:  Completed, signed off Medicare Important Message given?  YES (If response is "NO", the following Medicare IM given date fields will be blank) Date Medicare IM given:  07/29/2014 Medicare IM given by:  Marvetta Gibbons Date Additional Medicare IM given:   Additional Medicare IM given by:    Discharge Disposition:  [place]  Per UR Regulation:  Reviewed for med. necessity/level of care/duration of stay  If discussed at [place] of Stay Meetings, dates discussed:    Comments:  07/31/14- 1500- Marvetta Gibbons RN, BSN [phone] Pt for d/c home today with [facility]- call made and message left for [facility] at [facility]- regarding pt's discharge- d/c note faxed via epic to [facility]- pt did not qualify for home 02-  07/29/14- [facility] RN, BSN [phone] Orders in for HH-PT/OT- call made to [facility]- spoke with [facility] regarding [facility] - they can take referral - orders faxed to [facility]- with plan for d/c thur/fri of this week- will fax d/c summary when available-  07/28/14- 1400- Marvetta Gibbons RN, BSN [phone] Referral for [facility] needs- spoke with pt at bedside- per conversation pt states that she wants to return home with [facility] services- she has all  needed DME at this time that includes walker, cane, BSC, shower chair- she has used [facility] in past through [facility] and wants to use them again- lives in [place]- pt will need [place] orders with [facility] for HH-RN/PT/OT  for discharge- will make referral once orders placed.

## 2014-07-28 NOTE — Procedures (Signed)
Successful US guided left thoracentesis. Yielded 1L of clear, amber colored fluid. Pt developed severe cough reflex, procedure had to be stopped. No immediate complications.  Specimen was sent for labs. CXR ordered.  Ascencion Dike PA-C 07/28/2014 1:24 PM

## 2014-07-28 NOTE — Progress Notes (Signed)
Internal Medicine Attending  Date: 07/28/2014  Patient name: Joyce [name] Medical record number: [id number] Date of birth: 24-Jul-1940 Age: 74 y.o. Gender: female  I saw and evaluated the patient. I reviewed the resident's note by Dr. Trudee Kuster and I agree with the resident's findings and plans as documented in his progress note.  She was walking the halls today with physical therapy when seen on rounds this AM.  Her dyspnea on exertion was markedly improved with the right sided thoracentesis of 1 liter.  She subsequently underwent a left sided thoracentesis of about 1 liter and this demonstrated a lymphocyte predominant exudative effusion with an LDH ratio of 0.82 and total protein ratio of 0.77.  There were no eosinophils.  Given the time course of this effusions and no other signs and symptoms consistent with a post pericardiotomy syndrome such as fevers and pleuritic pain the diagnosis for her effusions is late non-specific post pericardotomy pleural effusions, which are typically lymphocyte predominant exudative effusions occuring several weeks after cardiac surgery.  Given the symptomatic nature and the size we will start ibuprofen 800 mg by mouth every 8 hours for at least 14 days, or to marked improvement.  We have discontinued the lasix as she is developing a pre-renal azotemia and does not have an indication for the loop diuretic given her normal LVEF and only very mild grade I diastolic dysfunction.  I anticipate she will be ready for discharge home with home PT in the morning.

## 2014-07-28 NOTE — Progress Notes (Addendum)
[address]       [place] [address]             [phone]          Subjective: Feels much better after thoracentesis on right  Objective: Vital signs in last 24 hours: Temp:  [98.2 F (36.8 C)-98.5 F (36.9 C)] 98.3 F (36.8 C) (02/02 0422) Pulse Rate:  [91-102] 91 (02/02 0422) Cardiac Rhythm:  [-] Normal sinus rhythm (02/01 2007) Resp:  [18] 18 (02/02 0422) BP: (121-148)/(47-80) 121/47 mmHg (02/02 0422) SpO2:  [93 %-96 %] 96 % (02/02 0422)  Hemodynamic parameters for last 24 hours:    Intake/Output from previous day: 02/01 0701 - 02/02 0700 In: 360 [P.O.:360] Out: -  Intake/Output this shift:    General appearance: alert, cooperative and no distress Heart: regular rate and rhythm Lungs: dim in the bases Abdomen: benign Extremities: no edema Wound: incis healing well  Lab Results:  Recent Labs  07/27/14 0457 07/28/14 0415  WBC 3.4* 3.4*  HGB 9.1* 9.4*  HCT 27.7* 28.1*  PLT 171 175   BMET:  Recent Labs  07/27/14 0457 07/28/14 0415  NA 136 134*  K 4.3 4.2  CL 100 100  CO2 32 28  GLUCOSE 106* 115*  BUN 27* 30*  CREATININE 1.62* 1.82*  CALCIUM 8.8 8.2*    PT/INR:  Recent Labs  07/27/14 0457  LABPROT 14.3  INR 1.10   ABG    Component Value Date/Time   PHART 7.295* 06/15/2014 2013   HCO3 19.7* 06/15/2014 2013   TCO2 18 06/16/2014 1712   ACIDBASEDEF 6.0* 06/15/2014 2013   O2SAT 97.0 06/15/2014 2013   CBG (last 3)   Recent Labs  07/27/14 1654 07/27/14 2135 07/28/14 0630  GLUCAP 173* 96 142*    Meds Scheduled Meds: . amLODipine  10 mg Oral Daily  . aspirin EC  325 mg Oral Daily  . atorvastatin  40 mg Oral q1800  . feeding supplement (GLUCERNA SHAKE)  237 mL Oral Daily  . ferrous Q000111Q C-folic acid  1 capsule Oral Q breakfast  . heparin  5,000 Units Subcutaneous 3 times per day  . hydrALAZINE  75 mg Oral TID  . insulin aspart  0-15 Units Subcutaneous TID WC  . insulin glargine  10  Units Subcutaneous QHS  . lisinopril  5 mg Oral Daily  . primidone  50 mg Oral Daily  . traZODone  100 mg Oral QHS  . zinc oxide   Topical TID   Continuous Infusions:  PRN Meds:.acetaminophen, magnesium hydroxide, traMADol  Xrays Dg Chest 1 View  07/27/2014   CLINICAL DATA:  Right thoracentesis.  EXAM: CHEST - 1 VIEW  COMPARISON:  07/27/2014 at 7:33 a.m.  FINDINGS: Decreased right pleural effusion post thoracentesis. No air leak or re-expansion pulmonary edema. Unchanged large left pleural effusion with extensive atelectasis, reference chest CT from 1 day prior. No pulmonary edema.  Stable cardiomegaly and aortic contours. The patient is status post CABG.  IMPRESSION: No evidence of complication post right thoracentesis.   Electronically Signed   By: Jorje Guild M.D.   On: 07/27/2014 12:08   Dg Chest 2 View  07/28/2014   CLINICAL DATA:  Difficulty breathing  EXAM: CHEST  2 VIEW  COMPARISON:  July 27, 2014  FINDINGS: There is bibasilar airspace consolidation with bilateral effusions. Lungs elsewhere clear. The heart size and pulmonary vascularity are normal. No adenopathy. Patient is status post coronary artery bypass grafting.  There is postoperative change in the lower cervical spine region. There is degenerative change in the lower thoracic and upper lumbar spine regions.  IMPRESSION: Bibasilar lung consolidations and small pleural effusions bilaterally. No pneumothorax. There is slightly greater consolidation in the right base compared to 1 day prior. Left hemithorax appears essentially stable.   Electronically Signed   By: Lowella Grip M.D.   On: 07/28/2014 07:40   X-ray Chest Pa And Lateral  07/27/2014   CLINICAL DATA:  74 year old female with shortness of breath and clinical history of recent heart surgery.  EXAM: CHEST  2 VIEW  COMPARISON:  Prior chest x-ray and chest CT scan 07/26/2014  FINDINGS: Stable cardiac and mediastinal contours. Patient is status post median sternotomy with  evidence of multivessel CABG. There has been no significant interval change in the appearance of the chest. Persistent bilateral layering pleural effusions and associated bibasilar atelectasis. No overt pulmonary edema. No pneumothorax. Incompletely imaged anterior cervical fusion hardware. No acute osseous abnormality.  IMPRESSION: 1. Stable bilateral layering pleural effusions and associated bibasilar atelectasis. 2. No evidence of pulmonary edema/CHF or other significant interval change.   Electronically Signed   By: Jacqulynn Cadet M.D.   On: 07/27/2014 07:55   Ct Angio Chest Pe W/cm &/or Wo Cm  07/26/2014   CLINICAL DATA:  Chest pain, shortness of breath.  EXAM: CT ANGIOGRAPHY CHEST WITH CONTRAST  TECHNIQUE: Multidetector CT imaging of the chest was performed using the standard protocol during bolus administration of intravenous contrast. Multiplanar CT image reconstructions and MIPs were obtained to evaluate the vascular anatomy.  CONTRAST:  39mL OMNIPAQUE IOHEXOL 350 MG/ML SOLN  COMPARISON:  Chest radiograph of same day.  FINDINGS: Large bilateral pleural effusions are noted with associated atelectasis of both lower lobes. No pneumothorax is noted. Mild opacity is noted in the left upper lobe concerning for subsegmental atelectasis. Status post coronary artery bypass graft. There is no evidence of thoracic aortic dissection. Ascending aorta is mildly dilated at 3.8 cm. There is no definite evidence of pulmonary embolus. No significant mediastinal mass or adenopathy is noted. No significant osseous abnormality is noted.  Review of the MIP images confirms the above findings.  IMPRESSION: No definite evidence of pulmonary embolus.  Large bilateral pleural effusions are noted with associated atelectasis of both lower lobes.  Mild dilatation of ascending thoracic aorta measured at 3.8 cm.   Electronically Signed   By: Sabino Dick M.D.   On: 07/26/2014 14:13   Dg Chest Port 1 View  07/26/2014   CLINICAL  DATA:  SOB x 3 weeks; worsening greatly in the last 2 days. Hx of CABG June 16, 2014.  EXAM: PORTABLE CHEST - 1 VIEW  COMPARISON:  07/15/2014  FINDINGS: Bilateral effusions and lung base opacity, most likely atelectasis, have mildly increased from the prior exam. No pulmonary edema.  Changes from CABG surgery are stable. Cardiac silhouette is mostly obscured by the lung base opacity. No mediastinal or hilar masses.  No pneumothorax.  IMPRESSION: 1. Mild increase in bilateral pleural effusions and associated lung base atelectasis. No pulmonary edema.   Electronically Signed   By: Lajean Manes M.D.   On: 07/26/2014 11:03   US Thoracentesis Asp Pleural Space W/img Guide  07/27/2014   INDICATION: Symptomatic bilateral pleural effusions, request for thoracentesis.  EXAM: US THORACENTESIS ASP PLEURAL SPACE W/IMG GUIDE  COMPARISON:  07/26/14.  MEDICATIONS: None  COMPLICATIONS: None immediate  TECHNIQUE: Informed written consent was obtained from the patient after a discussion  of the risks, benefits and alternatives to treatment. A timeout was performed prior to the initiation of the procedure.  Initial ultrasound scanning demonstrates a right pleural effusion. The lower chest was prepped and draped in the usual sterile fashion. 1% lidocaine was used for local anesthesia.  Under direct ultrasound guidance, a 19 gauge, 10-cm, Yueh catheter was introduced. An ultrasound image was saved for documentation purposes. The thoracentesis was performed. The catheter was removed and a dressing was applied. The patient tolerated the procedure well without immediate post procedural complication. The patient was escorted to have an upright chest radiograph.  FINDINGS: A total of approximately 1 liters of serous fluid was removed.  IMPRESSION: Successful ultrasound-guided right sided thoracentesis yielding 1 liters of pleural fluid.  Read By:  Tsosie Billing PA-C   Electronically Signed   By: Daryll Brod M.D.   On: 07/27/2014 14:32     Assessment/Plan:   1 doing well, for left thoracentesis today.   LOS: 2 days    GOLD,[name] 07/28/2014  Symptomatically improved after R 1 L thoracentesis Echo shows normal LV - no MR L thoracentesis sched today O2 being weaned Patient wishes to return home after this hospitalization rather than back to SNF-this appears approriate I will follow up patient w/ CXR in office to follow bilat pleural effusions-they were not present preop and prob multifactorial- diastolic HF CRI  low albumin  patient examined and medical record reviewed,agree with above note. [name],[name] 07/28/2014

## 2014-07-28 NOTE — Clinical Social Work Psychosocial (Signed)
Clinical Social Work Department BRIEF PSYCHOSOCIAL ASSESSMENT 07/28/2014  Patient:  Joyce [name], Joyce [name]     Account Number:  [id number]     Admit date:  07/26/2014  Clinical Social Worker:  Domenica Reamer, CLINICAL SOCIAL WORKER  Date/Time:  07/28/2014 02:04 PM  Referred by:  Physician  Date Referred:  07/26/2014 Referred for  SNF Placement   Other Referral:   Interview type:  Patient Other interview type:    PSYCHOSOCIAL DATA Living Status:  FACILITY Admitted from [facility] Level of care:  [place] Primary support name:  Sidda Keimig Primary support relationship to patient:  CHILD, ADULT Degree of support available:   high level of support- pt lives next to her son and daughter    CURRENT CONCERNS Current Concerns  Post-Acute Placement   Other Concerns:    [place] / PLAN CSW spoke with patient who states that she is from SNF ([facility] [place]) but that she does not wish to return.  PT and OT are recommending home health and pt reports that her family who lives next door are anxious for her to return and they can assist at home.   Assessment/plan status:  Psychosocial Support/Ongoing Assessment of Needs Other assessment/ plan:   FL2   Information/referral to community resources:   RNCM for home health needs    PATIENT'S/FAMILY'S RESPONSE TO PLAN OF CARE: Patient is agreeable to plan and is excited to return home vs returning to SNF.       Domenica Reamer, [place] Social Worker [phone]

## 2014-07-28 NOTE — Progress Notes (Signed)
Subjective:    Currently, Joyce [name] is doing well. She states that her breathing is "excellent," and has not worsened since yesterday; comfortable on 2L O2 [place]. Some SOB with ambulation. She denies chest pain. She has not yet had a bowel movement, and she believes her urine output is low. She is tolerating PT well, and her goal is to go straight home after discharge (is amenable to return to rehab SNF if necessary).  Interval Events: -- Left thoracentesis : 1L removed -- post-thoracentesis CXR w/ no evidence of pneumothorax   Objective:    Vital Signs:   Temp:  [98.2 F (36.8 C)-98.5 F (36.9 C)] 98.3 F (36.8 C) (02/02 0422) Pulse Rate:  [91-102] 91 (02/02 0422) Resp:  [18] 18 (02/02 0422) BP: (121-139)/(47-63) 121/47 mmHg (02/02 0422) SpO2:  [93 %-96 %] 96 % (02/02 0422) Last BM Date: 07/27/14  24-hour weight change: Weight change: n/a  Intake/Output:   Intake/Output Summary (Last 24 hours) at 07/28/14 1147 Last data filed at 07/28/14 0900  Gross per 24 hour  Intake    720 ml  Output    300 ml  Net    420 ml      Physical Exam: General: well-appearing, well-nourished. sitting in chair, NAD. Moderately obese. HEENT: EOMI, nares patent CV: RRR, normal S1 S2, no m/r/g Pulm: bibasilar crackles. normal work of breathing, no wheezes Abd: soft, nontender, nondistended Ext: 1+ pitting edema bilateral lower extremities Skin: warm, well perfused.  Labs:  Basic Metabolic Panel:  Recent Labs Lab 07/27/14 0457 07/28/14 0415  NA 136 134*  K 4.3 4.2  CL 100 100  CO2 32 28  GLUCOSE 106* 115*  BUN 27* 30*  CREATININE 1.62* 1.82*  CALCIUM 8.8 8.2*  MG 1.6 2.0  PHOS 4.6  --     Liver Function Tests:  Recent Labs Lab 07/27/14 0457 07/28/14 0415  AST 17  --   ALT 13  --   ALKPHOS 60  --   BILITOT 0.4  --   PROT 5.3* 4.8*  ALBUMIN 2.9*  --     CBC:  Recent Labs Lab 07/26/14 1015  07/28/14 0415  WBC 4.0  < > 3.4*  NEUTROABS 2.6  --   --   HGB  9.9*  < > 9.4*  HCT 30.3*  < > 28.1*  MCV 84.6  < > 85.7  PLT 190  < > 175  < > = values in this interval not displayed.  CBG (last 3):   Recent Labs  07/27/14 2135 07/28/14 0630 07/28/14 1116  GLUCAP 96 142* 116*   Lactate Dehydrogenase: 119       07/28/14 0415  Protein, total: 4.8      07/28/14 0415  Imaging: DG Chest 2 View  CLINICAL DATA: Difficulty breathing. EXAM: CHEST 2 VIEW. COMPARISON: July 27, 2014. FINDINGS: There is bibasilar airspace consolidation with bilateral effusions. Lungs elsewhere clear. The heart size and pulmonary vascularity are normal. No adenopathy. Patient is status post coronary artery bypass grafting. There is postoperative change in the lower cervical spine region. There is degenerative change in the lower thoracic and upper lumbar spine regions. IMPRESSION: Bibasilar lung consolidations and small pleural effusions bilaterally. No pneumothorax. There is slightly greater consolidation in the right base compared to 1 day prior. Left hemithorax appears essentially stable.  Electronically Signed By: Lowella Grip M.D. On: 07/28/2014 07:40  DG Chest 1 View  CLINICAL DATA: Post left-sided thoracentesis. EXAM: CHEST - 1 VIEW. COMPARISON: 07/28/2014 at  6:12 a.m. FINDINGS: Decreased left pleural effusion post thoracentesis. No postoperative air leak. Unchanged small to moderate right pleural effusion from earlier today. Bibasilar atelectasis is also unchanged. Stable heart size and mediastinal contours post CABG. No pulmonary edema noted in the upper lungs. IMPRESSION: No postoperative pneumothorax. Electronically Signed. By: Jorje Guild M.D. On: 07/28/2014 13:53     Medications:    Infusions:    Scheduled Medications: . amLODipine  10 mg Oral Daily  . aspirin EC  325 mg Oral Daily  . atorvastatin  40 mg Oral q1800  . feeding supplement (GLUCERNA SHAKE)  237 mL Oral Daily  . ferrous Q000111Q C-folic acid  1 capsule Oral Q  breakfast  . Glycerin (Adult)  1 suppository Rectal Once  . heparin  5,000 Units Subcutaneous 3 times per day  . hydrALAZINE  75 mg Oral TID  . insulin aspart  0-15 Units Subcutaneous TID WC  . insulin glargine  10 Units Subcutaneous QHS  . lactulose  30 g Oral Daily  . lisinopril  5 mg Oral Daily  . polyethylene glycol  17 g Oral Daily  . primidone  50 mg Oral Daily  . traZODone  100 mg Oral QHS  . zinc oxide   Topical TID    PRN Medications: acetaminophen, lactulose, magnesium hydroxide, traMADol   Assessment/ Plan:    Pt is a 74 y.o. yo female with a PMHx of CKD, T2DM, HTN, fibromyalgia, and CAD s/p CABGx4 who was admitted on 07/26/2014 with symptoms of SOB, which was determined to be secondary to bilateral pleural effusions. Interventions at this time will be focused on further workup for underlying cause of pleural effusions.   Pleural Effusions: Effusions are likely the cause of SOB. Echo 12/21 showed EF 45-50%, an improvement from pre-bypass tests. Repeat echo 2/1 showed EF 55-60%. CXR and CTA chest on admission 07/26/14 revealed bilateral pleural effusions. Pt underwent Right thoracenteses on 2/1 with 1L removed. Left thoracentesis on 2/2 with 1L removed. -- d/c lasix to 80mg  IV today. Restart 40mg  IV lasix tomorrow -- continue 5mg  lisinopril -- follow up thoracentesis fluid cytology  Constipation: pt reports that she has not had BM in 4 days.  -- miralax daily -- milk of magnesia PRN -- glycerin 2.1G suppository  CAD s/p CABG: on 40mg  atorvastatin (Lipitor) and 325mg  Aspirin at home -- continue Lipitor, Aspirin, and lisinopril  HTN: Takes 10mg  amlodipine (Norvasc), 40mg  lasix, 75mg  hydralazine TID at home -- continue home amlodipine and hydralazine -- hold lasix as described above  T2DM: Sugars had been running 80-150 at home. She is treated with humalog TID and Lantus. CBG have been 115-142 over the past 24hrs. -- continue SSI Humalog 8u TID with meals; Lantus 10u  qHS -- continue to monitor daily BG TID before meals and at bedtime  Fibromyalgia: treated with 100mg  trazadone, 50mg  primidone (Mysoline), and 50mg  tramadol daily + 50mg  tramadol q6H PRN at home -- continue trazadone, primidone, and tramadol  CKD: on admission, pt had mild bilateral flank pain and reported urinary frequency. Currently, no fevers or dysuria. UA negative. 07/28/14 SCr 1.82 -- continue to monitor  FEN/GI:  -- carb modified diet  PPx: -- subcutaneous heparin 5,000 units q8H  Code: FULL CODE -- contact daughter, Lovie Chol [phone]) or son, Jalayla Biagi [phone]) in the event that patient is unable to make medical decisions  Consults placed: -- PT: recommendations below -- OT: recommendations below -- CT Surgery: agree w/ plans for echo and thoracentesis  Dispo: Disposition  is deferred at this time, awaiting improvement of current medical problems. Pt would prefer to be discharged directly to home rather than SNF.  The patient does have a current PCP (Dr. Bailey Mech and Jacklynn Bue in [place]) and Cardiologist (Dr. Sherren Mocha, and Dr. Nils Pyle, cardiothoracic surgery). Need for an [facility] hospital follow-up appointment after discharge TBD.  Unknown if patient will have transportation limitations that hinder transportation to clinic appointments.  SERVICE NEEDED AT [place]         Y = Yes, Blank = No PT:  yes - home health; intermittent supervision  OT: yes - home health; intermittent supervision  RN:   Equipment:   Other:      Length of Stay: 2 day(s)   Signed: Louisa Second, Med Student  Pager: [phone] (7AM-5PM) 07/28/2014, 11:47 AM

## 2014-07-29 ENCOUNTER — Inpatient Hospital Stay (HOSPITAL_COMMUNITY): Payer: Medicare Other

## 2014-07-29 DIAGNOSIS — J90 Pleural effusion, not elsewhere classified: Secondary | ICD-10-CM

## 2014-07-29 DIAGNOSIS — N179 Acute kidney failure, unspecified: Secondary | ICD-10-CM

## 2014-07-29 LAB — GLUCOSE, CAPILLARY
GLUCOSE-CAPILLARY: 142 mg/dL — AB (ref 70–99)
Glucose-Capillary: 122 mg/dL — ABNORMAL HIGH (ref 70–99)
Glucose-Capillary: 92 mg/dL (ref 70–99)
Glucose-Capillary: 130 mg/dL — ABNORMAL HIGH (ref 70–99)

## 2014-07-29 LAB — BASIC METABOLIC PANEL
Anion gap: 6 (ref 5–15)
BUN: 36 mg/dL — AB (ref 6–23)
CALCIUM: 8.2 mg/dL — AB (ref 8.4–10.5)
CO2: 30 mmol/L (ref 19–32)
CREATININE: 2.43 mg/dL — AB (ref 0.50–1.10)
Chloride: 98 mmol/L (ref 96–112)
GFR calc Af Amer: 22 mL/min — ABNORMAL LOW (ref >90)
GFR calc non Af Amer: 19 mL/min — ABNORMAL LOW (ref >90)
Glucose, Bld: 112 mg/dL — ABNORMAL HIGH (ref 70–99)
POTASSIUM: 4.5 mmol/L (ref 3.5–5.1)
Sodium: 134 mmol/L — ABNORMAL LOW (ref 135–145)

## 2014-07-29 LAB — PH, BODY FLUID: PH, FLUID: 8

## 2014-07-29 LAB — SODIUM, URINE, RANDOM: Sodium, Ur: 23 mmol/L

## 2014-07-29 MED ORDER — IBUPROFEN 800 MG PO TABS
800.0000 mg | ORAL_TABLET | Freq: Three times a day (TID) | ORAL | Status: DC
  Administered 2014-07-29 – 2014-07-30 (×3): 800 mg via ORAL
  Filled 2014-07-29 (×6): qty 1

## 2014-07-29 MED ORDER — SODIUM CHLORIDE 0.9 % IV BOLUS (SEPSIS)
500.0000 mL | Freq: Once | INTRAVENOUS | Status: AC
  Administered 2014-07-29: 500 mL via INTRAVENOUS

## 2014-07-29 NOTE — Progress Notes (Signed)
Subjective:    Joyce [name] is doing well this morning. She states she is breathing well on 2L  O2, but sometimes gets SOB particularly with ambulation. She reports that she had a bowel movement yesterday evening and again this morning.   Interval Events: -- Repeat CXR this morning -- started Tylenol  -- discontinued lisinopril -- discontinued lasix   Objective:    Vital Signs:   Temp:  [97.4 F (36.3 C)-98.1 F (36.7 C)] 97.4 F (36.3 C) (02/03 0515) Pulse Rate:  [74-88] 74 (02/03 0515) Resp:  [18] 18 (02/03 0515) BP: (101-134)/(49-74) 101/49 mmHg (02/03 0515) SpO2:  [91 %-95 %] 95 % (02/03 0515) Last BM Date: 07/28/14  24-hour weight change: Weight change: n/a  Intake/Output:   Intake/Output Summary (Last 24 hours) at 07/29/14 1012 Last data filed at 07/29/14 0835  Gross per 24 hour  Intake    720 ml  Output    601 ml  Net    119 ml      Physical Exam: General: well-appearing, well-nourished. sitting in chair, NAD. Moderately obese. HEENT: EOMI, nares patent CV: RRR, normal S1 S2, no m/r/g Pulm: bibasilar crackles. normal work of breathing, no wheezes Ext: 1+ pitting edema bilateral lower extremities Skin: warm, well perfused.  Labs:  Basic Metabolic Panel:  Recent Labs Lab 07/27/14 0457 07/28/14 0415 07/29/14 0336  NA 136 134* 134*  K 4.3 4.2 4.5  CL 100 100 98  CO2 32 28 30  GLUCOSE 106* 115* 112*  BUN 27* 30* 36*  CREATININE 1.62* 1.82* 2.43*  CALCIUM 8.8 8.2* 8.2*  MG 1.6 2.0  --   PHOS 4.6  --   --    CBG (last 3)   Recent Labs  07/28/14 1630 07/28/14 2117 07/29/14 0555  GLUCAP 144* 124* 122*   Micro: Results for orders placed or performed during the hospital encounter of 07/26/14  Body fluid culture     Status: None (Preliminary result)   Collection Time: 07/28/14  1:34 PM  Result Value Ref Range Status   Specimen Description FLUID PLEURAL LEFT  Final   Special Requests NONE  Final   Gram Stain   Final    FEW WBC PRESENT,  PREDOMINANTLY MONONUCLEAR NO ORGANISMS SEEN Performed at [facility]    Culture NO GROWTH Performed at [facility]   Final   Report Status PENDING  Incomplete    Imaging:  CLINICAL DATA: F/u eval post open heart surgery and issues with pleural effusion - pt states no issues with SOB or CP today EXAM: CHEST - 2 VIEW. COMPARISON: the previous day's study. FINDINGS: Previous CABG. Persistent bilateral pleural effusions. Patchy airspace opacities in both lung bases, slightly increased on the left since prior study. Heart size within normal limits for technique. Cervical fixation hardware noted. Spurring in the lower thoracic spine. IMPRESSION: 1. Persistent bilateral pleural effusions. 2. Worsening left lower lobe airspace disease. Electronically Signed By: Arne Cleveland M.D. On: 07/29/2014 08:40    Medications:    Infusions: none    Scheduled Medications: . amLODipine  10 mg Oral Daily  . aspirin EC  325 mg Oral Daily  . atorvastatin  40 mg Oral q1800  . feeding supplement (GLUCERNA SHAKE)  237 mL Oral Daily  . ferrous Q000111Q C-folic acid  1 capsule Oral Q breakfast  . Glycerin (Adult)  1 suppository Rectal Once  . heparin  5,000 Units Subcutaneous 3 times per day  . hydrALAZINE  75 mg Oral TID  .  ibuprofen  800 mg Oral 3 times per day  . insulin aspart  0-15 Units Subcutaneous TID WC  . insulin glargine  10 Units Subcutaneous QHS  . lactulose  30 g Oral Daily  . polyethylene glycol  17 g Oral Daily  . primidone  50 mg Oral Daily  . sodium chloride  500 mL Intravenous Once  . traZODone  100 mg Oral QHS  . zinc oxide   Topical TID    PRN Medications: acetaminophen, lactulose, magnesium hydroxide, traMADol   Assessment/ Plan:    Pt is a 74 y.o. yo female with a PMHx of CKD, T2DM, HTN, fibromyalgia, and CAD s/p CABGx4 who was admitted on 07/26/2014 with symptoms of SOB, which was determined to be secondary to bilateral pleural effusions.  Interventions at this time will be focused on managing acutely elevated SCr and preparing for likely discharge tomorrow.   Pleural Effusions: Effusions are likely the cause of SOB. Echo 12/21 showed EF 45-50%, an improvement from pre-bypass tests. Repeat echo 2/1 showed EF 55-60%. CXR and CTA chest on admission 07/26/14 revealed bilateral pleural effusions. Pt underwent Right thoracenteses on 2/1 with 1L removed. Left thoracentesis on 2/2 with 1L of exudative fluid removed. Pleural fluid analysis showed LDH ratio 0.82, protein ratio 0.77, no eosinophils). These are late postcardiotomy effusions that arose after CABG; treatment is NSAID. Since this is not heart failure, pt no longer needs lasix.  -- ibuprofen 800mg  PO TID for 14 days -- discontinue 5mg  lisinopril  CKD/AKI risk:  SCr 1.50 on admission; 2.43 today. Rise likely due to lisinopril.  -- discontinue lisinopril -- 576mL IV NS bolus  Constipation: 4 days of constipation, now resolved. -- continue miralax daily -- continue milk of magnesia PRN  CAD s/p CABG: on 40mg  atorvastatin (Lipitor) and 325mg  Aspirin at home -- continue Lipitor, Aspirin -- discontinue lisinopril as above  HTN: Takes 10mg  amlodipine (Norvasc), 40mg  lasix, 75mg  hydralazine TID at home -- continue home amlodipine and hydralazine -- discontinue lasix as above  T2DM: Sugars had been running 80-150 at home. She is treated with humalog TID and Lantus. CBG have been 112-144 over the past 24hrs. -- continue SSI Humalog 8u TID with meals; Lantus 10u qHS -- continue to monitor daily BG TID before meals and at bedtime  Fibromyalgia: treated with 100mg  trazadone, 50mg  primidone (Mysoline), and 50mg  tramadol daily + 50mg  tramadol q6H PRN at home -- continue trazadone, primidone, and tramadol  FEN/GI:  -- carb modified diet  PPx: -- subcutaneous heparin 5,000 units q8H  Code: FULL CODE -- contact daughter, Lovie Chol [id number]) or son, Stacha Schranz  [phone]) in the event that patient is unable to make medical decisions  Consults placed: -- PT: recommendations below -- OT: recommendations below -- CT Surgery: agree w/ plans for echo and thoracentesis  Dispo: Likely discharge tomorrow. Pt would prefer to be discharged directly to home rather than SNF.  The patient does have a current PCP (Dr. Bailey Mech and Jacklynn Bue in [place]) and Cardiologist (Dr. Sherren Mocha, and Dr. Nils Pyle, cardiothoracic surgery). Need for an [facility] hospital follow-up appointment after discharge TBD.  Unknown if patient will have transportation limitations that hinder transportation to clinic appointments.  SERVICE NEEDED AT [place]  Y = Yes, Blank = No PT: yes - home health; intermittent supervision  OT: yes - home health; intermittent supervision  RN:   Equipment:   Other:     Length of Stay: 3 day(s)  Signed: Louisa Second, Med Student  Pager: [phone] (7AM-5PM) 07/29/2014, 10:12 AM

## 2014-07-29 NOTE — Progress Notes (Signed)
Occupational Therapy Treatment Patient Details Name: Joyce [name] MRN: [id number] DOB: [birth year] Today's Date: 07/29/2014    History of present illness 74 yo female who was in rehab s/p CABG and now returns to hospital from rehab with B pleural effusions.  Pt with thoracentesis 2/1 and 2nd planned 2/2   OT comments  Focus of session on energy conservation instruction, toileting ambulating to bathroom and standing grooming.  Educated pt that she may use her 3 in 1 as a shower seat at home.  Pt eager to go home.  Follow Up Recommendations  Home health OT;Supervision - Intermittent    Equipment Recommendations  None recommended by OT    Recommendations for Other Services      Precautions / Restrictions Precautions Precautions: Sternal Precaution Comments: pt is knowledgeable in sternal precautions Restrictions Weight Bearing Restrictions: Yes Other Position/Activity Restrictions: sternal precautions.       Mobility Bed Mobility               General bed mobility comments: in chair on arrival  Transfers   Equipment used: 1 person hand held assist   Sit to Stand: Modified independent (Device/Increase time)         General transfer comment: Pt did well with following sternal precautions.    Balance                                   ADL Overall ADL's : Needs assistance/impaired     Grooming: Wash/dry hands;Brushing hair;Standing;Supervision/safety           Upper Body Dressing : Set up;Sitting   Lower Body Dressing: Supervision/safety;Sit to/from stand Lower Body Dressing Details (indicate cue type and reason): no difficulty donning socks, crosses foot over opposite knee Toilet Transfer: Supervision/safety;Comfort height toilet;Ambulation Toilet Transfer Details (indicate cue type and reason): supervision without assistive device Toileting- Clothing Manipulation and Hygiene: Supervision/safety;Sit to/from stand       Functional  mobility during ADLs: Supervision/safety General ADL Comments: Provided and reviewed energy conservation handout.  Instructed in benefits of seated showering and that pt may use her 3 in1 at home as a shower seat.      Vision                     Perception     Praxis      Cognition   Behavior During Therapy: WFL for tasks assessed/performed Overall Cognitive Status: Within Functional Limits for tasks assessed                       Extremity/Trunk Assessment               Exercises     Shoulder Instructions       General Comments      Pertinent Vitals/ Pain       Pain Assessment: No/denies pain  Home Living                                          Prior Functioning/Environment              Frequency Min 2X/week     Progress Toward Goals  OT Goals(current goals can now be found in the care plan section)  Progress towards OT goals: Progressing toward goals  Acute Rehab  OT Goals Patient Stated Goal: to go straight home from here.  Plan Discharge plan remains appropriate    Co-evaluation                 End of Session Equipment Utilized During Treatment: Oxygen (2L)   Activity Tolerance Patient tolerated treatment well   Patient Left in chair;with call bell/phone within reach   Nurse Communication          Time: [id number] OT Time Calculation (min): 27 min  Charges: OT General Charges $OT Visit: 1 Procedure OT Treatments $Self Care/Home Management : 23-37 mins  Malka So 07/29/2014, 11:30 AM  [phone]

## 2014-07-29 NOTE — Progress Notes (Addendum)
Subjective:    She continues to have improved breathing and feels better.  She has some dyspnea with exertion, but it is significantly better than at presentation.  He lower extremity swelling is much better. She had a bowel movement yesterday and this morning, and her abdominal discomfort is much better.  Interval Events: -Pleural effusion analysis consistent with exudative effusion. -Started on ibuprofen 800 mg q8h to help with the effusion. -Cr bumped to 2.43.   Objective:    Vital Signs:   Temp:  [97.4 F (36.3 C)-98.1 F (36.7 C)] 98 F (36.7 C) (02/03 1300) Pulse Rate:  [74-90] 90 (02/03 1300) Resp:  [18] 18 (02/03 1300) BP: (101-134)/(49-54) 129/54 mmHg (02/03 1300) SpO2:  [91 %-97 %] 97 % (02/03 1300) Last BM Date: 07/28/14  24-hour weight change: Weight change:   Intake/Output:   Intake/Output Summary (Last 24 hours) at 07/29/14 1501 Last data filed at 07/29/14 1300  Gross per 24 hour  Intake    960 ml  Output    601 ml  Net    359 ml      Physical Exam: General: Vital signs reviewed and noted. Well-developed, well-nourished, in no acute distress; alert, appropriate and cooperative throughout examination.  Lungs:  Breathing comfortably.  Bilateral crackles with decreased breath sounds at bilateral lung bases.  Heart: RRR. S1 and S2 normal without gallop, murmur, or rubs.  Abdomen:  BS normoactive. Soft, Nondistended, non-tender.  No masses or organomegaly.  Extremities: No edema.     Labs:  Basic Metabolic Panel:  Recent Labs Lab 07/26/14 1015 07/27/14 0457 07/28/14 0415 07/29/14 0336  NA 134* 136 134* 134*  K 4.0 4.3 4.2 4.5  CL 101 100 100 98  CO2 25 32 28 30  GLUCOSE 108* 106* 115* 112*  BUN 27* 27* 30* 36*  CREATININE 1.50* 1.62* 1.82* 2.43*  CALCIUM 8.9 8.8 8.2* 8.2*  MG  --  1.6 2.0  --   PHOS  --  4.6  --   --     Liver Function Tests:  Recent Labs Lab 07/27/14 0457 07/28/14 0415  AST 17  --   ALT 13  --   ALKPHOS 60  --     BILITOT 0.4  --   PROT 5.3* 4.8*  ALBUMIN 2.9*  --    CBC:  Recent Labs Lab 07/26/14 1015 07/27/14 0457 07/28/14 0415  WBC 4.0 3.4* 3.4*  NEUTROABS 2.6  --   --   HGB 9.9* 9.1* 9.4*  HCT 30.3* 27.7* 28.1*  MCV 84.6 83.9 85.7  PLT 190 171 175    CBG:  Recent Labs Lab 07/28/14 1116 07/28/14 1630 07/28/14 2117 07/29/14 0555 07/29/14 1132  GLUCAP 116* 144* 124* 122* 142*    Microbiology: Results for orders placed or performed during the hospital encounter of 07/26/14  MRSA PCR Screening     Status: None   Collection Time: 07/26/14  7:04 PM  Result Value Ref Range Status   MRSA by PCR NEGATIVE NEGATIVE Final    Comment:        The GeneXpert MRSA Assay (FDA approved for NASAL specimens only), is one component of a comprehensive MRSA colonization surveillance program. It is not intended to diagnose MRSA infection nor to guide or monitor treatment for MRSA infections.   Body fluid culture     Status: None (Preliminary result)   Collection Time: 07/28/14  1:34 PM  Result Value Ref Range Status   Specimen Description FLUID PLEURAL LEFT  Final   Special Requests NONE  Final   Gram Stain   Final    FEW WBC PRESENT, PREDOMINANTLY MONONUCLEAR NO ORGANISMS SEEN Performed at [facility]    Culture NO GROWTH Performed at [facility]   Final   Report Status PENDING  Incomplete    Coagulation Studies:  Recent Labs  07/27/14 0457  LABPROT 14.3  INR 1.10     Imaging: Dg Chest 1 View  07/28/2014   CLINICAL DATA:  Post left-sided thoracentesis.  EXAM: CHEST - 1 VIEW  COMPARISON:  07/28/2014 at 6:12 a.m.  FINDINGS: Decreased left pleural effusion post thoracentesis. No postoperative air leak.  Unchanged small to moderate right pleural effusion from earlier today. Bibasilar atelectasis is also unchanged. Stable heart size and mediastinal contours post CABG. No pulmonary edema noted in the upper lungs.  IMPRESSION: No postoperative pneumothorax.    Electronically Signed   By: Jorje Guild M.D.   On: 07/28/2014 13:53   Dg Chest 2 View  07/29/2014   CLINICAL DATA:  F/u eval post open heart surgery and issues with pleural effusion - pt states no issues with SOB or CP today  EXAM: CHEST - 2 VIEW  COMPARISON:  the previous day's study  FINDINGS: Previous CABG. Persistent bilateral pleural effusions. Patchy airspace opacities in both lung bases, slightly increased on the left since prior study. Heart size within normal limits for technique. Cervical fixation hardware noted. Spurring in the lower thoracic spine.  IMPRESSION: 1. Persistent bilateral pleural effusions. 2. Worsening left lower lobe airspace disease.   Electronically Signed   By: Arne Cleveland M.D.   On: 07/29/2014 08:40   Dg Chest 2 View  07/28/2014   CLINICAL DATA:  Difficulty breathing  EXAM: CHEST  2 VIEW  COMPARISON:  July 27, 2014  FINDINGS: There is bibasilar airspace consolidation with bilateral effusions. Lungs elsewhere clear. The heart size and pulmonary vascularity are normal. No adenopathy. Patient is status post coronary artery bypass grafting. There is postoperative change in the lower cervical spine region. There is degenerative change in the lower thoracic and upper lumbar spine regions.  IMPRESSION: Bibasilar lung consolidations and small pleural effusions bilaterally. No pneumothorax. There is slightly greater consolidation in the right base compared to 1 day prior. Left hemithorax appears essentially stable.   Electronically Signed   By: Lowella Grip M.D.   On: 07/28/2014 07:40   US Thoracentesis Asp Pleural Space W/img Guide  07/28/2014   CLINICAL DATA:  Shortness of breath, left-sided pleural effusion. Request diagnostic and therapeutic thoracentesis.  EXAM: ULTRASOUND GUIDED LEFT THORACENTESIS  COMPARISON:  Right thoracentesis performed 07/27/2014  PROCEDURE: An ultrasound guided thoracentesis was thoroughly discussed with the patient and questions answered. The  benefits, risks, alternatives and complications were also discussed. The patient understands and wishes to proceed with the procedure. Written consent was obtained.  Ultrasound was performed to localize and mark an adequate pocket of fluid in the left chest. The area was then prepped and draped in the normal sterile fashion. 1% Lidocaine was used for local anesthesia. Under ultrasound guidance a 19 gauge Yueh catheter was introduced. Thoracentesis was performed. The catheter was removed and a dressing applied.  COMPLICATIONS: None.  Postprocedural chest x-ray negative for pneumothorax.  The patient did develop significant cough reflex and procedure had to be terminated prematurely.  FINDINGS: A total of approximately 1 L of clear, amber colored fluid was removed. A fluid sample wassent for laboratory analysis.  IMPRESSION: Successful ultrasound guided  left thoracentesis yielding 1 L of pleural fluid.  Read by: Ascencion Dike PA-C   Electronically Signed   By: Markus Daft M.D.   On: 07/28/2014 14:02       Medications:    Infusions:    Scheduled Medications: . amLODipine  10 mg Oral Daily  . aspirin EC  325 mg Oral Daily  . atorvastatin  40 mg Oral q1800  . feeding supplement (GLUCERNA SHAKE)  237 mL Oral Daily  . ferrous [id number] C-folic acid  1 capsule Oral Q breakfast  . heparin  5,000 Units Subcutaneous 3 times per day  . hydrALAZINE  75 mg Oral TID  . ibuprofen  800 mg Oral 3 times per day  . insulin aspart  0-15 Units Subcutaneous TID WC  . insulin glargine  10 Units Subcutaneous QHS  . lactulose  30 g Oral Daily  . polyethylene glycol  17 g Oral Daily  . primidone  50 mg Oral Daily  . traZODone  100 mg Oral QHS  . zinc oxide   Topical TID    PRN Medications: acetaminophen, lactulose, magnesium hydroxide, traMADol   Assessment/ Plan:    Principal Problem:   Exudative pleural effusion Active Problems:   Chronic kidney disease (CKD), stage III (moderate)   HTN  (hypertension)   Diabetes mellitus type 2 in obese   S/P CABG x 4   Diastolic CHF, chronic   Normocytic anemia   Leukopenia  #Post-thoracotomy pleural effusions Effusions are exudative based on both of Light's criteria.  No eosinophils consistent with late non-specific post-thoracotamy pleural effusion.  This should improve with reduction of inflammation.  Chest x-ray post-procedure unremarkable.  Breathing significantly better. -Start ibuprofen 800 mg q8h for 14 days. -Follow up with cardiothoracic surgery as an outpatient. -Plan to discharge home. -Placed orders for home health RN, PT, and OT.  #Acute kidney injury Due to overdiuresis with normal heart function.  Lisinopril could have contributed as well.  Will give some fluid back and continue NSAID due to hope to overcome pleural effusions, but will monitor creatinine. -Stop lisinopril. -500 ml NS bolus over 2 hours. -Stop Lasix indefinitely. -Recheck BMP tomorrow morning.  #Constipation Resolved.  Had been started on lactulose by CT surgery and did not require glycerin suppository. -Continue lactulose 30 g daily and 20 g PRN. -Continue Miralax 17 g daily. -MoM PRN.  #CAD s/p CABG Cannot tolerate beta blockers. -Continue aspirin 325 mg daily. -Continue atorvastatin. -Stop lisinopril 5 mg daily in setting of AKI, can consider restarting as outpatient.  #HTN Normotensive. -Continue amlodipine 10 mg daily, hydralazine 75 mg TID. -Stop lisinopril 5 mg daily.  #DM2 CBGs stable 120-140. -CBGs ACHS. -SSI-M. -Continue Lantus 10 units QHS.  #Fibromyalgia -Continue primidone and tramadol 50 mg q6h PRN.   DVT PPX - heparin  CODE STATUS - Full  CONSULTS PLACED - Cardiothoracic surgery  DISPO - Disposition is deferred at this time, awaiting improvement of AKI.  Anticipated discharge in approximately 1-2 day(s).   The patient does not have a current PCP (No primary care provider on file.) and does need an [facility] hospital  follow-up appointment after discharge.    Is the [facility] hospital follow-up appointment a one-time only appointment? yes.  Does the patient have transportation limitations that hinder transportation to clinic appointments? yes   SERVICE NEEDED AT [facility]         Y = Yes, Blank = No PT: Home health  OT: Home  health  RN: Home health  Equipment:   Other:      Length of Stay: 3 day(s)   Signed: Arman Filter, MD  PGY-1, Internal Medicine Resident Pager: [phone] (7AM-5PM) 07/29/2014, 3:01 PM

## 2014-07-29 NOTE — Discharge Summary (Signed)
Name: Joyce [name] MRN: [id number] DOB: 17-Aug-1940 74 y.o. PCP: Allie Dimmer, MD  Date of Admission: 07/26/2014  9:41 AM Date of Discharge: 07/31/2014 Attending Physician: Karren Cobble, MD  Discharge Diagnosis:  Principal Problem:   Exudative pleural effusion Active Problems:   Chronic kidney disease (CKD), stage III (moderate)   HTN (hypertension)   Diabetes mellitus type 2 in obese   S/P CABG x 4   Diastolic CHF, chronic   Normocytic anemia   Leukopenia   Dyspnea  Discharge Medications:   Medication List    STOP taking these medications        furosemide 40 MG tablet  Commonly known as:  LASIX     potassium chloride 10 MEQ tablet  Commonly known as:  K-DUR,KLOR-CON      TAKE these medications        acetaminophen 325 MG tablet  Commonly known as:  TYLENOL  Take 650 mg by mouth every 6 (six) hours as needed for moderate pain (pain/fever).     amLODipine 10 MG tablet  Commonly known as:  NORVASC  Take 10 mg by mouth daily.     aspirin 325 MG EC tablet  Take 1 tablet (325 mg total) by mouth daily.     atorvastatin 40 MG tablet  Commonly known as:  LIPITOR  Take 1 tablet (40 mg total) by mouth daily at 6 PM.     ferrous [id number] C-folic acid capsule  Commonly known as:  TRINSICON / FOLTRIN  Take 1 capsule by mouth daily with breakfast. For one month then stop.     GLUCERNA Liqd  Take 237 mLs by mouth daily.     hydrALAZINE 25 MG tablet  Commonly known as:  APRESOLINE  Take 3 tablets (75 mg total) by mouth every 8 (eight) hours.     ibuprofen 600 MG tablet  Commonly known as:  ADVIL,MOTRIN  Take 1 tablet (600 mg total) by mouth every 8 (eight) hours.     insulin aspart 100 UNIT/ML injection  Commonly known as:  novoLOG  - Inject 0-24 Units into the skin 4 (four) times daily -  before meals and at bedtime. Units of Insulin  - If CBG,120=0     251-300=12  - 121-160=2         301-350=16  - 161-200=4         351-450=20  -  201-250=8         >450=24 and call MD     insulin glargine 100 UNIT/ML injection  Commonly known as:  LANTUS  Inject 10 Units into the skin at bedtime.     insulin lispro 100 UNIT/ML KiwkPen  Commonly known as:  HUMALOG  Inject 8 Units into the skin 3 (three) times daily with meals.     Menthol-Zinc Oxide 0.44-20.625 % Oint  Apply 1 application topically 3 (three) times daily. Apply to buttocks and groin topically every shift for rash     metFORMIN 500 MG tablet  Commonly known as:  GLUCOPHAGE  Take 500 mg by mouth 2 (two) times daily with a meal. 9am, 6pm     primidone 50 MG tablet  Commonly known as:  MYSOLINE  Take 50 mg by mouth daily.     traMADol 50 MG tablet  Commonly known as:  ULTRAM  Take 1 tablet (50 mg total) by mouth every 6 (six) hours as needed for moderate pain.     traZODone 100 MG tablet  Commonly known as:  DESYREL  Take 100 mg by mouth at bedtime.        Disposition and follow-up:   Ms.Lalla O Sabol was discharged from [facility] in Stable condition.  At the hospital follow up visit please address:  1.  Respiratory status, resolution of pleural effusions, resolution of acute kidney injury, need for continued ibuprofen, consider restarting lisinopril.  2.  Labs / imaging needed at time of follow-up: Repeat BMP.  3.  Pending labs/ test needing follow-up: Body fluid culture and chemistry.  Follow-up Appointments: Follow-up Information    Follow up with [facility].   Why:  HH-PT/OT arranged   Contact information:   [phone]      Follow up with VAN Wilber Oliphant, MD On 08/12/2014.   Specialty:  Cardiothoracic Surgery   Why:  10:30 am   Contact information:   [place] [id number] [phone]       Follow up with Allie Dimmer, MD On 08/07/2014.   Specialty:  Internal Medicine   Why:  10:45 am   Contact information:   [place]  [id number] [id number]       Discharge Instructions:   Thank you for allowing [place] to be involved in your healthcare while you were hospitalized at [facility]   Please note that there have been changes to your home medications.  --> PLEASE LOOK AT YOUR DISCHARGE MEDICATION LIST FOR DETAILS.   Please call your PCP if you have any questions or concerns, or any difficulty getting any of your medications.  Please return to the ER if you have worsening of your symptoms or new severe symptoms arise.  Do not take your Lasix or potassium at discharge to allow your kidneys to improve. Do not take lisinopril either. Please follow-up with your PCP to see when you should resume these medications.  Your shortness of breath should continue to improve.  Continue to take ibuprofen 600 mg three times a day for 12 more days.  Make sure to follow up with Cardiothoracic Surgery and your PCP to check your breathing status.  Consultations: Cardiothoracic surgery.  Procedures Performed:  Dg Chest 1 View  07/30/2014   CLINICAL DATA:  Status post right-sided thoracentesis  EXAM: CHEST  1 VIEW  COMPARISON:  July 30, 2014 study obtained earlier in the day  FINDINGS: No demonstrable pneumothorax. Right pleural effusion is slightly smaller. There is consolidation with small effusion on the left. There is a small effusion on the right with mild atelectasis. Heart is mildly enlarged with pulmonary vascularity within normal limits. Patient is status post coronary artery bypass grafting. No adenopathy.  IMPRESSION: No pneumothorax. Less effusion on the right. Persistent left lower lobe consolidation with small effusion. Heart prominent but stable.   Electronically Signed   By: Lowella Grip M.D.   On: 07/30/2014 15:21   Dg Chest 1 View  07/28/2014   CLINICAL DATA:  Post left-sided thoracentesis.  EXAM: CHEST - 1 VIEW  COMPARISON:  07/28/2014 at 6:12 a.m.  FINDINGS: Decreased left pleural effusion post  thoracentesis. No postoperative air leak.  Unchanged small to moderate right pleural effusion from earlier today. Bibasilar atelectasis is also unchanged. Stable heart size and mediastinal contours post CABG. No pulmonary edema noted in the upper lungs.  IMPRESSION: No postoperative pneumothorax.   Electronically Signed   By: Jorje Guild M.D.   On: 07/28/2014 13:53   Dg Chest 1 View  07/27/2014  CLINICAL DATA:  Right thoracentesis.  EXAM: CHEST - 1 VIEW  COMPARISON:  07/27/2014 at 7:33 a.m.  FINDINGS: Decreased right pleural effusion post thoracentesis. No air leak or re-expansion pulmonary edema. Unchanged large left pleural effusion with extensive atelectasis, reference chest CT from 1 day prior. No pulmonary edema.  Stable cardiomegaly and aortic contours. The patient is status post CABG.  IMPRESSION: No evidence of complication post right thoracentesis.   Electronically Signed   By: Jorje Guild M.D.   On: 07/27/2014 12:08   Dg Chest 2 View  07/30/2014   CLINICAL DATA:  Short of breath, history of pleural effusions  EXAM: CHEST  2 VIEW  COMPARISON:  Chest x-ray of 07/29/2014  FINDINGS: Aeration of the lungs has improved slightly. Bibasilar opacities remain consistent with atelectasis and effusions. Pneumonia cannot be excluded particularly at the left lung base. Mild cardiomegaly is stable. Median sternotomy sutures are noted from CABG.  IMPRESSION: Slightly better aeration. Persistent bibasilar opacities consistent with atelectasis and effusions. Cannot exclude pneumonia particularly at the left lung base.   Electronically Signed   By: Ivar Drape M.D.   On: 07/30/2014 08:10   Dg Chest 2 View  07/29/2014   CLINICAL DATA:  F/u eval post open heart surgery and issues with pleural effusion - pt states no issues with SOB or CP today  EXAM: CHEST - 2 VIEW  COMPARISON:  the previous day's study  FINDINGS: Previous CABG. Persistent bilateral pleural effusions. Patchy airspace opacities in both lung  bases, slightly increased on the left since prior study. Heart size within normal limits for technique. Cervical fixation hardware noted. Spurring in the lower thoracic spine.  IMPRESSION: 1. Persistent bilateral pleural effusions. 2. Worsening left lower lobe airspace disease.   Electronically Signed   By: Arne Cleveland M.D.   On: 07/29/2014 08:40   Dg Chest 2 View  07/28/2014   CLINICAL DATA:  Difficulty breathing  EXAM: CHEST  2 VIEW  COMPARISON:  July 27, 2014  FINDINGS: There is bibasilar airspace consolidation with bilateral effusions. Lungs elsewhere clear. The heart size and pulmonary vascularity are normal. No adenopathy. Patient is status post coronary artery bypass grafting. There is postoperative change in the lower cervical spine region. There is degenerative change in the lower thoracic and upper lumbar spine regions.  IMPRESSION: Bibasilar lung consolidations and small pleural effusions bilaterally. No pneumothorax. There is slightly greater consolidation in the right base compared to 1 day prior. Left hemithorax appears essentially stable.   Electronically Signed   By: Lowella Grip M.D.   On: 07/28/2014 07:40   X-ray Chest Pa And Lateral  07/27/2014   CLINICAL DATA:  74 year old female with shortness of breath and clinical history of recent heart surgery.  EXAM: CHEST  2 VIEW  COMPARISON:  Prior chest x-ray and chest CT scan 07/26/2014  FINDINGS: Stable cardiac and mediastinal contours. Patient is status post median sternotomy with evidence of multivessel CABG. There has been no significant interval change in the appearance of the chest. Persistent bilateral layering pleural effusions and associated bibasilar atelectasis. No overt pulmonary edema. No pneumothorax. Incompletely imaged anterior cervical fusion hardware. No acute osseous abnormality.  IMPRESSION: 1. Stable bilateral layering pleural effusions and associated bibasilar atelectasis. 2. No evidence of pulmonary edema/CHF or  other significant interval change.   Electronically Signed   By: Jacqulynn Cadet M.D.   On: 07/27/2014 07:55   Dg Chest 2 View  07/15/2014   CLINICAL DATA:  Subsequent encounter for postop  CABG with slight cough and shortness of breath.  EXAM: CHEST  2 VIEW  COMPARISON:  06/18/2014  FINDINGS: There is persistent bibasilar atelectasis with small bilateral pleural effusions. The cardio pericardial silhouette is enlarged. Imaged bony structures of the thorax are intact. Patient is status post CABG.  IMPRESSION: Persistent but decreased bibasilar atelectasis with small bilateral pleural effusions.   Electronically Signed   By: Misty Stanley M.D.   On: 07/15/2014 11:36   Ct Angio Chest Pe W/cm &/or Wo Cm  07/26/2014   CLINICAL DATA:  Chest pain, shortness of breath.  EXAM: CT ANGIOGRAPHY CHEST WITH CONTRAST  TECHNIQUE: Multidetector CT imaging of the chest was performed using the standard protocol during bolus administration of intravenous contrast. Multiplanar CT image reconstructions and MIPs were obtained to evaluate the vascular anatomy.  CONTRAST:  20mL OMNIPAQUE IOHEXOL 350 MG/ML SOLN  COMPARISON:  Chest radiograph of same day.  FINDINGS: Large bilateral pleural effusions are noted with associated atelectasis of both lower lobes. No pneumothorax is noted. Mild opacity is noted in the left upper lobe concerning for subsegmental atelectasis. Status post coronary artery bypass graft. There is no evidence of thoracic aortic dissection. Ascending aorta is mildly dilated at 3.8 cm. There is no definite evidence of pulmonary embolus. No significant mediastinal mass or adenopathy is noted. No significant osseous abnormality is noted.  Review of the MIP images confirms the above findings.  IMPRESSION: No definite evidence of pulmonary embolus.  Large bilateral pleural effusions are noted with associated atelectasis of both lower lobes.  Mild dilatation of ascending thoracic aorta measured at 3.8 cm.   Electronically  Signed   By: Sabino Dick M.D.   On: 07/26/2014 14:13   Dg Chest Port 1 View  07/26/2014   CLINICAL DATA:  SOB x 3 weeks; worsening greatly in the last 2 days. Hx of CABG June 16, 2014.  EXAM: PORTABLE CHEST - 1 VIEW  COMPARISON:  07/15/2014  FINDINGS: Bilateral effusions and lung base opacity, most likely atelectasis, have mildly increased from the prior exam. No pulmonary edema.  Changes from CABG surgery are stable. Cardiac silhouette is mostly obscured by the lung base opacity. No mediastinal or hilar masses.  No pneumothorax.  IMPRESSION: 1. Mild increase in bilateral pleural effusions and associated lung base atelectasis. No pulmonary edema.   Electronically Signed   By: Lajean Manes M.D.   On: 07/26/2014 11:03   US Thoracentesis Asp Pleural Space W/img Guide  07/30/2014   CLINICAL DATA:  Recurrent pleural effusions. Request therapeutic thoracentesis  EXAM: ULTRASOUND GUIDED RIGHT THORACENTESIS  COMPARISON:  Previous thoracentesis performed 2/1(right) and 2/2(left)  PROCEDURE: An ultrasound guided thoracentesis was thoroughly discussed with the patient and questions answered. The benefits, risks, alternatives and complications were also discussed. The patient understands and wishes to proceed with the procedure. Written consent was obtained.  Ultrasound of bilateral chest demonstrates bilateral moderate pleural effusions. Right side is chosen for thoracentesis.  Ultrasound was performed to localize and mark an adequate pocket of fluid in the right chest. The area was then prepped and draped in the normal sterile fashion. 1% Lidocaine was used for local anesthesia. Under ultrasound guidance a 19 gauge Yueh catheter was introduced. Thoracentesis was performed. The catheter was removed and a dressing applied.  Complications:  None  FINDINGS: A total of approximately 1.3 L of clear yellow fluid was removed. A fluid sample was notsent for laboratory analysis.  IMPRESSION: Successful ultrasound guided  right thoracentesis yielding 1.3 L of pleural  fluid.  Read by: Ascencion Dike PA-C   Electronically Signed   By: Markus Daft M.D.   On: 07/30/2014 14:51   US Thoracentesis Asp Pleural Space W/img Guide  07/28/2014   CLINICAL DATA:  Shortness of breath, left-sided pleural effusion. Request diagnostic and therapeutic thoracentesis.  EXAM: ULTRASOUND GUIDED LEFT THORACENTESIS  COMPARISON:  Right thoracentesis performed 07/27/2014  PROCEDURE: An ultrasound guided thoracentesis was thoroughly discussed with the patient and questions answered. The benefits, risks, alternatives and complications were also discussed. The patient understands and wishes to proceed with the procedure. Written consent was obtained.  Ultrasound was performed to localize and mark an adequate pocket of fluid in the left chest. The area was then prepped and draped in the normal sterile fashion. 1% Lidocaine was used for local anesthesia. Under ultrasound guidance a 19 gauge Yueh catheter was introduced. Thoracentesis was performed. The catheter was removed and a dressing applied.  COMPLICATIONS: None.  Postprocedural chest x-ray negative for pneumothorax.  The patient did develop significant cough reflex and procedure had to be terminated prematurely.  FINDINGS: A total of approximately 1 L of clear, amber colored fluid was removed. A fluid sample wassent for laboratory analysis.  IMPRESSION: Successful ultrasound guided left thoracentesis yielding 1 L of pleural fluid.  Read by: Ascencion Dike PA-C   Electronically Signed   By: Markus Daft M.D.   On: 07/28/2014 14:02   US Thoracentesis Asp Pleural Space W/img Guide  07/27/2014   INDICATION: Symptomatic bilateral pleural effusions, request for thoracentesis.  EXAM: US THORACENTESIS ASP PLEURAL SPACE W/IMG GUIDE  COMPARISON:  07/26/14.  MEDICATIONS: None  COMPLICATIONS: None immediate  TECHNIQUE: Informed written consent was obtained from the patient after a discussion of the risks, benefits and  alternatives to treatment. A timeout was performed prior to the initiation of the procedure.  Initial ultrasound scanning demonstrates a right pleural effusion. The lower chest was prepped and draped in the usual sterile fashion. 1% lidocaine was used for local anesthesia.  Under direct ultrasound guidance, a 19 gauge, 10-cm, Yueh catheter was introduced. An ultrasound image was saved for documentation purposes. The thoracentesis was performed. The catheter was removed and a dressing was applied. The patient tolerated the procedure well without immediate post procedural complication. The patient was escorted to have an upright chest radiograph.  FINDINGS: A total of approximately 1 liters of serous fluid was removed.  IMPRESSION: Successful ultrasound-guided right sided thoracentesis yielding 1 liters of pleural fluid.  Read By:  Tsosie Billing PA-C   Electronically Signed   By: Daryll Brod M.D.   On: 07/27/2014 14:32    2D Echo:  Study Conclusions  - Left ventricle: The cavity size was normal. Systolic function was normal. The estimated ejection fraction was in the range of 55% to 60%. Although no diagnostic regional wall motion abnormality was identified, this possibility cannot be completely excluded on the basis of this study. Doppler parameters are consistent with abnormal left ventricular relaxation (grade 1 diastolic dysfunction). - Ventricular septum: Septal motion showed paradox. - Atrial septum: No defect or patent foramen ovale was identified. - Pericardium, extracardiac: There was a left pleural effusion.  Cardiac Cath: None.  Admission HPI:  Ms. Solesbee is a 74 year old female with DM2, HTN, CAD s/p CABG [06/15/14], CKD Stage 3B who presents from SNF with shortness of breath.  Four days ago, she reports being unable to participate in rehab. Her oxygen requirement increased during this time from 2L to 4L, and she feels short  of breath at all times irrespective of  exertion; she cannot tolerate lying supine at all and has to sit up. She reports associated leg swelling, decreased appetite, and cough [particularly when she sits up from [place] though denies any chest pain, chest tightness, wheezing, sick contacts, fevers, prior smoking history, prior lung disease.   Of note, she underwent cardiac cath on 06/11/14 which was notable for EF 55-65% and severe three-vessel disease [LAD 80-90% stenosis, AV circumflex 95-99% stenosis, and RCA 75-80% stenosis]. She then underwent CABG x 4 by Dr. Unknown Jim on 06/16/14. was seen by Dr. Unknown Jim on 07/15/13 at which time she recalled feeling short of breath. CXR then showed bilateral to moderate pleural effusions, so four-day course of metolazone 5mg  was added to her Lasix 40mg . She cannot recall taking these medications as she reports the facility handles her medication. She otherwise denies dizziness, abdominal pain, nausea, vomiting, diarrhea, hematuria, hemoptysis, dysuria though has not had a bowel movement since 2 days ago.   In the ED, CTA was ordered and showed large bilateral pleural effusions but no PE. She was given Lasix 40mg  IV x 1.  Hospital Course by problem list: Principal Problem:   Exudative pleural effusion Active Problems:   Chronic kidney disease (CKD), stage III (moderate)   HTN (hypertension)   Diabetes mellitus type 2 in obese   S/P CABG x 4   Diastolic CHF, chronic   Normocytic anemia   Leukopenia   Dyspnea   #Post-thoracotomy pleural effusions Ms. Abdelaziz underwent CABG on 06/16/2014 by Dr. Lucianne Lei trite. She presented with increasing shortness of breath over the last several days prior to admission. Chest x-ray showed large bilateral pleural effusions. Cardiothoracic surgery was consulted and recommended thoracentesis to drain the fluid.  The effusions were initially thought to be due to new onset heart failure, and she was initially diuresed with IV Lasix without resolution of her  effusions. Echocardiogram showed normal systolic function with grade 1 diastolic dysfunction, and her BNP was normal on admission. This suggested that the effusions were not cardiac in origin. She underwent bilateral thoracentesis with drainage of 1 L from each side. Fluid analysis demonstrated exudative pleural effusions with no neutrophils consistent with late nonspecific post thoracotomy pleural effusions. She was saturating well, but continued to have dyspnea, so she underwent a third thoracentesis to reduce the fluid volume.  She reported improvement of her shortness of breath at discharge, but she was still not back to baseline at discharge likely due to deconditioning.  Home health PT was ordered to help with her conditioning and strength.  She was started on ibuprofen 800 mg every 8 hours for 14 days to help with resolution of the effusions.  This was ultimately reduced to 600 mg every 8 hours due to her acute kidney injury and continued at discharge.  At follow up, determine if she should continue on the ibuprofen.  #Acute kidney injury Ms. Felmlee was initially diuresed with IV Lasix and started on lisinopril 5 mg daily because her effusions were thought to be due to heart failure. This led to volume depletion and acute kidney injury with a rise in her creatinine to 2.43. At this point, Lasix and lisinopril were stopped, and she was given 500 mL of normal saline. Her creatinine trended down prior to discharge.  Lasix and lisinopril were held at discharge, and she was told not to take these medications.  Lasix should not be restarted unless there is a new indication because she  does not have any evidence of heart failure.  #Coronary artery disease status post CABG Ms. Eiler is unable to tolerate beta blockers. She was continued on her home aspirin 325 mg daily and atorvastatin. Lisinopril was initially started given her history of coronary artery disease, but had to be stopped due to her acute  kidney injury. She may benefit from restarting this medication in the future.  #Constipation Ms. Evitts complained of abdominal discomfort and constipation for a couple of days prior to admission. She was given a glycerin suppository with partial relief, and started on lactulose and MiraLAX with resolution of normal bowel movements.  #Hypertension Ms. Rementer was normotensive on her home blood pressure regimen of amlodipine 10 mg daily and hydralazine 70 mg 3 times a day.  #Type 2 diabetes She was maintained on sliding scale insulin-moderate and Lantus 10 units at bedtime during the hospitalization with stable blood sugar control. Her home insulin regimen was resumed on discharge.  #Fibromyalgia We continued her home primidone and tramadol 50 mg every 6 hours as needed with control of her pain.  Discharge Vitals:   BP 131/59 mmHg  Pulse 94  Temp(Src) 98.3 F (36.8 C) (Oral)  Resp 18  Ht 5\' 5"  (1.651 m)  Wt 193 lb 2 oz (87.6 kg)  BMI 32.14 kg/m2  SpO2 97%  Discharge Labs:  Results for orders placed or performed during the hospital encounter of 07/26/14 (from the past 24 hour(s))  Basic metabolic panel     Status: Abnormal   Collection Time: 07/31/14  5:48 AM  Result Value Ref Range   Sodium 139 135 - 145 mmol/L   Potassium 4.4 3.5 - 5.1 mmol/L   Chloride 105 96 - 112 mmol/L   CO2 27 19 - 32 mmol/L   Glucose, Bld 146 (H) 70 - 99 mg/dL   BUN 31 (H) 6 - 23 mg/dL   Creatinine, Ser 1.79 (H) 0.50 - 1.10 mg/dL   Calcium 8.6 8.4 - 10.5 mg/dL   GFR calc non Af Amer 27 (L) >90 mL/min   GFR calc Af Amer 32 (L) >90 mL/min   Anion gap 7 5 - 15  Glucose, capillary     Status: Abnormal   Collection Time: 07/31/14  5:55 AM  Result Value Ref Range   Glucose-Capillary 145 (H) 70 - 99 mg/dL  Glucose, capillary     Status: Abnormal   Collection Time: 07/31/14 11:39 AM  Result Value Ref Range   Glucose-Capillary 129 (H) 70 - 99 mg/dL    Signed: Arman Filter, MD 07/31/2014, 9:38 PM     Services Ordered on Discharge: Home health RN, PT, OT. Equipment Ordered on Discharge: None.

## 2014-07-29 NOTE — Progress Notes (Signed)
Medicare Important Message given? YES  (If response is "NO", the following Medicare IM given date fields will be blank)  Date Medicare IM given: 07/29/14 Medicare IM given by:  Dahlia [employer]

## 2014-07-29 NOTE — Progress Notes (Signed)
Internal Medicine Attending  Date: 07/29/2014  Patient name: Joyce [name] Medical record number: [id number] Date of birth: [birth year] Age: 74 y.o. Gender: female  I saw and evaluated the patient. I reviewed the resident's note by Dr. Trudee Kuster and I agree with the resident's findings and plans as documented in his progress note.  Joyce [name]'s dyspnea is markedly improved since admission and unchanged from after yesterday's thoracentesis. Physical exam is notable for decreased breath sounds in the bilateral bases with some associated inspiratory crackles. Her creatinine jumped to 2.4 this morning. This is likely related to excessive diuresis with the initial presumption she had heart failure. Ace inhibition was added and did not help the GFR. Admittedly, she was started on ibuprofen for her large late nonspecific post pericardectomy effusions which could impact negatively upon her renal function. That being said, we believe by stopping the ACE inhibitor and providing her with a 500 mL normal saline bolus as well as stopping the Lasix indefinitely we should be able to see an improvement in her renal function within the next 24-48 hours. We believe the NSAIDs are very important to help decrease the likelihood of reaccumulation of her large effusions. Her remaining dyspnea may be secondary to some continued atelectasis. We will therefore initiate incentive spirometry and educate her on how to use it. We are hopeful that if her creatinine improves tomorrow she should be ready for discharge home with an approximate two-week course of nonsteroidal anti-inflammatories for the purpose of avoiding reaccumulation of her pleural effusions.

## 2014-07-29 NOTE — Progress Notes (Addendum)
[address]       [place] [address]             [phone]          Subjective: Feels better symptomatically  Objective: Vital signs in last 24 hours: Temp:  [97.4 F (36.3 C)-98.1 F (36.7 C)] 97.4 F (36.3 C) (02/03 0515) Pulse Rate:  [74-88] 74 (02/03 0515) Cardiac Rhythm:  [-] Normal sinus rhythm (02/02 1930) Resp:  [18] 18 (02/03 0515) BP: (101-134)/(49-74) 101/49 mmHg (02/03 0515) SpO2:  [91 %-95 %] 95 % (02/03 0515)  Hemodynamic parameters for last 24 hours:    Intake/Output from previous day: 02/02 0701 - 02/03 0700 In: 720 [P.O.:720] Out: 300 [Urine:300] Intake/Output this shift:    General appearance: alert, cooperative and no distress Heart: regular rate and rhythm Lungs: dim in lower fields Abdomen: benign Extremities: min edema Wound: incis healing well  Lab Results:  Recent Labs  07/27/14 0457 07/28/14 0415  WBC 3.4* 3.4*  HGB 9.1* 9.4*  HCT 27.7* 28.1*  PLT 171 175   BMET:  Recent Labs  07/28/14 0415 07/29/14 0336  NA 134* 134*  K 4.2 4.5  CL 100 98  CO2 28 30  GLUCOSE 115* 112*  BUN 30* 36*  CREATININE 1.82* 2.43*  CALCIUM 8.2* 8.2*    PT/INR:  Recent Labs  07/27/14 0457  LABPROT 14.3  INR 1.10   ABG    Component Value Date/Time   PHART 7.295* 06/15/2014 2013   HCO3 19.7* 06/15/2014 2013   TCO2 18 06/16/2014 1712   ACIDBASEDEF 6.0* 06/15/2014 2013   O2SAT 97.0 06/15/2014 2013   CBG (last 3)   Recent Labs  07/28/14 1630 07/28/14 2117 07/29/14 0555  GLUCAP 144* 124* 122*    Meds Scheduled Meds: . amLODipine  10 mg Oral Daily  . aspirin EC  325 mg Oral Daily  . atorvastatin  40 mg Oral q1800  . feeding supplement (GLUCERNA SHAKE)  237 mL Oral Daily  . ferrous Q000111Q C-folic acid  1 capsule Oral Q breakfast  . Glycerin (Adult)  1 suppository Rectal Once  . heparin  5,000 Units Subcutaneous 3 times per day  . hydrALAZINE  75 mg Oral TID  . ibuprofen  800 mg Oral 3  times per day  . insulin aspart  0-15 Units Subcutaneous TID WC  . insulin glargine  10 Units Subcutaneous QHS  . lactulose  30 g Oral Daily  . polyethylene glycol  17 g Oral Daily  . primidone  50 mg Oral Daily  . traZODone  100 mg Oral QHS  . zinc oxide   Topical TID   Continuous Infusions:  PRN Meds:.acetaminophen, lactulose, magnesium hydroxide, traMADol  Xrays Dg Chest 1 View  07/28/2014   CLINICAL DATA:  Post left-sided thoracentesis.  EXAM: CHEST - 1 VIEW  COMPARISON:  07/28/2014 at 6:12 a.m.  FINDINGS: Decreased left pleural effusion post thoracentesis. No postoperative air leak.  Unchanged small to moderate right pleural effusion from earlier today. Bibasilar atelectasis is also unchanged. Stable heart size and mediastinal contours post CABG. No pulmonary edema noted in the upper lungs.  IMPRESSION: No postoperative pneumothorax.   Electronically Signed   By: Jorje Guild M.D.   On: 07/28/2014 13:53   Dg Chest 1 View  07/27/2014   CLINICAL DATA:  Right thoracentesis.  EXAM: CHEST - 1 VIEW  COMPARISON:  07/27/2014 at 7:33 a.m.  FINDINGS: Decreased right pleural effusion post thoracentesis.  No air leak or re-expansion pulmonary edema. Unchanged large left pleural effusion with extensive atelectasis, reference chest CT from 1 day prior. No pulmonary edema.  Stable cardiomegaly and aortic contours. The patient is status post CABG.  IMPRESSION: No evidence of complication post right thoracentesis.   Electronically Signed   By: Jorje Guild M.D.   On: 07/27/2014 12:08   Dg Chest 2 View  07/28/2014   CLINICAL DATA:  Difficulty breathing  EXAM: CHEST  2 VIEW  COMPARISON:  July 27, 2014  FINDINGS: There is bibasilar airspace consolidation with bilateral effusions. Lungs elsewhere clear. The heart size and pulmonary vascularity are normal. No adenopathy. Patient is status post coronary artery bypass grafting. There is postoperative change in the lower cervical spine region. There is  degenerative change in the lower thoracic and upper lumbar spine regions.  IMPRESSION: Bibasilar lung consolidations and small pleural effusions bilaterally. No pneumothorax. There is slightly greater consolidation in the right base compared to 1 day prior. Left hemithorax appears essentially stable.   Electronically Signed   By: Lowella Grip M.D.   On: 07/28/2014 07:40   X-ray Chest Pa And Lateral  07/27/2014   CLINICAL DATA:  74 year old female with shortness of breath and clinical history of recent heart surgery.  EXAM: CHEST  2 VIEW  COMPARISON:  Prior chest x-ray and chest CT scan 07/26/2014  FINDINGS: Stable cardiac and mediastinal contours. Patient is status post median sternotomy with evidence of multivessel CABG. There has been no significant interval change in the appearance of the chest. Persistent bilateral layering pleural effusions and associated bibasilar atelectasis. No overt pulmonary edema. No pneumothorax. Incompletely imaged anterior cervical fusion hardware. No acute osseous abnormality.  IMPRESSION: 1. Stable bilateral layering pleural effusions and associated bibasilar atelectasis. 2. No evidence of pulmonary edema/CHF or other significant interval change.   Electronically Signed   By: Jacqulynn Cadet M.D.   On: 07/27/2014 07:55   US Thoracentesis Asp Pleural Space W/img Guide  07/28/2014   CLINICAL DATA:  Shortness of breath, left-sided pleural effusion. Request diagnostic and therapeutic thoracentesis.  EXAM: ULTRASOUND GUIDED LEFT THORACENTESIS  COMPARISON:  Right thoracentesis performed 07/27/2014  PROCEDURE: An ultrasound guided thoracentesis was thoroughly discussed with the patient and questions answered. The benefits, risks, alternatives and complications were also discussed. The patient understands and wishes to proceed with the procedure. Written consent was obtained.  Ultrasound was performed to localize and mark an adequate pocket of fluid in the left chest. The area was  then prepped and draped in the normal sterile fashion. 1% Lidocaine was used for local anesthesia. Under ultrasound guidance a 19 gauge Yueh catheter was introduced. Thoracentesis was performed. The catheter was removed and a dressing applied.  COMPLICATIONS: None.  Postprocedural chest x-ray negative for pneumothorax.  The patient did develop significant cough reflex and procedure had to be terminated prematurely.  FINDINGS: A total of approximately 1 L of clear, amber colored fluid was removed. A fluid sample wassent for laboratory analysis.  IMPRESSION: Successful ultrasound guided left thoracentesis yielding 1 L of pleural fluid.  Read by: Ascencion Dike PA-C   Electronically Signed   By: Markus Daft M.D.   On: 07/28/2014 14:02   US Thoracentesis Asp Pleural Space W/img Guide  07/27/2014   INDICATION: Symptomatic bilateral pleural effusions, request for thoracentesis.  EXAM: US THORACENTESIS ASP PLEURAL SPACE W/IMG GUIDE  COMPARISON:  07/26/14.  MEDICATIONS: None  COMPLICATIONS: None immediate  TECHNIQUE: Informed written consent was obtained from the patient after  a discussion of the risks, benefits and alternatives to treatment. A timeout was performed prior to the initiation of the procedure.  Initial ultrasound scanning demonstrates a right pleural effusion. The lower chest was prepped and draped in the usual sterile fashion. 1% lidocaine was used for local anesthesia.  Under direct ultrasound guidance, a 19 gauge, 10-cm, Yueh catheter was introduced. An ultrasound image was saved for documentation purposes. The thoracentesis was performed. The catheter was removed and a dressing was applied. The patient tolerated the procedure well without immediate post procedural complication. The patient was escorted to have an upright chest radiograph.  FINDINGS: A total of approximately 1 liters of serous fluid was removed.  IMPRESSION: Successful ultrasound-guided right sided thoracentesis yielding 1 liters of pleural  fluid.  Read By:  Tsosie Billing PA-C   Electronically Signed   By: Daryll Brod M.D.   On: 07/27/2014 14:32    Assessment/Plan:  1 s/p bilat thoracentesis for effusions post CABG with good symptomatic relief 2 Creat has increased- will d/c nsaids, may have to consider different htn agent than hydralazine if creat remains elevated 3 cont pulm toilet for some compressive atelectasis, wean O2   LOS: 3 days    GOLD,[name] 07/29/2014  pleural effusions and O2 requirement improved however creat increased If effusion recurs the patient will need Pleurx catheter as diuretics on hold check CXR in am  patient examined and medical record reviewed,agree with above note. [name],Temitayo Covalt 07/29/2014

## 2014-07-30 ENCOUNTER — Inpatient Hospital Stay (HOSPITAL_COMMUNITY): Payer: Medicare Other

## 2014-07-30 DIAGNOSIS — Z9889 Other specified postprocedural states: Secondary | ICD-10-CM

## 2014-07-30 DIAGNOSIS — Z794 Long term (current) use of insulin: Secondary | ICD-10-CM

## 2014-07-30 DIAGNOSIS — N189 Chronic kidney disease, unspecified: Secondary | ICD-10-CM

## 2014-07-30 DIAGNOSIS — E1122 Type 2 diabetes mellitus with diabetic chronic kidney disease: Secondary | ICD-10-CM

## 2014-07-30 DIAGNOSIS — Z951 Presence of aortocoronary bypass graft: Secondary | ICD-10-CM

## 2014-07-30 DIAGNOSIS — R0600 Dyspnea, unspecified: Secondary | ICD-10-CM | POA: Insufficient documentation

## 2014-07-30 DIAGNOSIS — I129 Hypertensive chronic kidney disease with stage 1 through stage 4 chronic kidney disease, or unspecified chronic kidney disease: Secondary | ICD-10-CM

## 2014-07-30 LAB — URINALYSIS, ROUTINE W REFLEX MICROSCOPIC
Bilirubin Urine: NEGATIVE
Glucose, UA: NEGATIVE mg/dL
Hgb urine dipstick: NEGATIVE
Ketones, ur: NEGATIVE mg/dL
Leukocytes, UA: NEGATIVE
Nitrite: NEGATIVE
Protein, ur: NEGATIVE mg/dL
Specific Gravity, Urine: 1.008 (ref 1.005–1.030)
UROBILINOGEN UA: 0.2 mg/dL (ref 0.0–1.0)
pH: 6 (ref 5.0–8.0)

## 2014-07-30 LAB — BASIC METABOLIC PANEL
Anion gap: 8 (ref 5–15)
BUN: 39 mg/dL — ABNORMAL HIGH (ref 6–23)
CO2: 26 mmol/L (ref 19–32)
Calcium: 8.3 mg/dL — ABNORMAL LOW (ref 8.4–10.5)
Chloride: 100 mmol/L (ref 96–112)
Creatinine, Ser: 2.01 mg/dL — ABNORMAL HIGH (ref 0.50–1.10)
GFR calc Af Amer: 28 mL/min — ABNORMAL LOW (ref >90)
GFR calc non Af Amer: 24 mL/min — ABNORMAL LOW (ref >90)
Glucose, Bld: 113 mg/dL — ABNORMAL HIGH (ref 70–99)
Potassium: 4.4 mmol/L (ref 3.5–5.1)
Sodium: 134 mmol/L — ABNORMAL LOW (ref 135–145)

## 2014-07-30 LAB — GLUCOSE, CAPILLARY
GLUCOSE-CAPILLARY: 107 mg/dL — AB (ref 70–99)
Glucose-Capillary: 114 mg/dL — ABNORMAL HIGH (ref 70–99)
Glucose-Capillary: 166 mg/dL — ABNORMAL HIGH (ref 70–99)
Glucose-Capillary: 130 mg/dL — ABNORMAL HIGH (ref 70–99)

## 2014-07-30 MED ORDER — LIDOCAINE HCL (PF) 1 % IJ SOLN
INTRAMUSCULAR | Status: AC
  Filled 2014-07-30: qty 10

## 2014-07-30 MED ORDER — IBUPROFEN 600 MG PO TABS
600.0000 mg | ORAL_TABLET | Freq: Three times a day (TID) | ORAL | Status: DC
  Administered 2014-07-30 – 2014-07-31 (×4): 600 mg via ORAL
  Filled 2014-07-30 (×6): qty 1

## 2014-07-30 NOTE — Progress Notes (Signed)
Subjective:    Joyce [name] is doing well today. No acute events overnight. Her breathing had overall improved since admission. She felt okay on 2L O2 [place], but was short of breath on room air after walking to the restroom. Incentive spirometer at bedside and pt familiar with using it. She reports that she finally started adequately urinating overnight. Per pt, PT has not worked with her in two days, but she is doing well with OT.  Pt was made aware of options moving forward given current state of SOB including repeat thoracentesis, more PT, and discharge home. She favored more PT and thoracentesis.     Objective:    Vital Signs:   Temp:  [98 F (36.7 C)-98.4 F (36.9 C)] 98.4 F (36.9 C) (02/04 0437) Pulse Rate:  [79-90] 79 (02/04 0437) Resp:  [18] 18 (02/04 0437) BP: (129-135)/(54-57) 135/55 mmHg (02/04 0437) SpO2:  [96 %-97 %] 96 % (02/04 0437) Weight:  [87.6 kg (193 lb 2 oz)] 87.6 kg (193 lb 2 oz) (02/04 0437) Last BM Date: 07/29/14   Walking Trial: Patient Saturations on Room Air at Rest = 93% Patient Saturations on [employer] while Ambulating = 92% Patient Saturations on 2L Liters of oxygen while Ambulating = 96%  24-hour weight change: Weight change: n/a  Intake/Output:   Intake/Output Summary (Last 24 hours) at 07/30/14 0849 Last data filed at 07/30/14 0617  Gross per 24 hour  Intake 768.33 ml  Output   2401 ml  Net -1632.67 ml      Physical Exam: General: well-appearing, well-nourished. sitting on side of bed, NAD HEENT: EOMI, nares patent CV: RRR, normal S1 S2, no m/r/g Pulm: bibasilar inspiratory crackles. normal work of breathing, no wheezes Ext: no edema Skin: warm, well perfused.  Labs:  Basic Metabolic Panel:  Recent Labs Lab 07/27/14 0457 07/28/14 0415  07/30/14 0500  NA 136 134*  < > 134*  K 4.3 4.2  < > 4.4  CL 100 100  < > 100  CO2 32 28  < > 26  GLUCOSE 106* 115*  < > 113*  BUN 27* 30*  < > 39*  CREATININE 1.62* 1.82*  < > 2.01*    CALCIUM 8.8 8.2*  < > 8.3*  MG 1.6 2.0  --   --   PHOS 4.6  --   --   --   < > = values in this interval not displayed.  CBG (last 3)   Recent Labs  07/29/14 1640 07/29/14 2053 07/30/14 0615  GLUCAP 92 130* 114*    Imaging: Dg Chest 2 View  07/29/2014   CLINICAL DATA:  F/u eval post open heart surgery and issues with pleural effusion - pt states no issues with SOB or CP today  EXAM: CHEST - 2 VIEW  COMPARISON:  the previous day's study  FINDINGS: Previous CABG. Persistent bilateral pleural effusions. Patchy airspace opacities in both lung bases, slightly increased on the left since prior study. Heart size within normal limits for technique. Cervical fixation hardware noted. Spurring in the lower thoracic spine.  IMPRESSION: 1. Persistent bilateral pleural effusions. 2. Worsening left lower lobe airspace disease.   Electronically Signed   By: Arne Cleveland M.D.   On: 07/29/2014 08:40       Medications:    Infusions:    Scheduled Medications: . amLODipine  10 mg Oral Daily  . aspirin EC  325 mg Oral Daily  . atorvastatin  40 mg Oral q1800  . feeding  supplement (GLUCERNA SHAKE)  237 mL Oral Daily  . ferrous Q000111Q C-folic acid  1 capsule Oral Q breakfast  . heparin  5,000 Units Subcutaneous 3 times per day  . hydrALAZINE  75 mg Oral TID  . ibuprofen  800 mg Oral 3 times per day  . insulin aspart  0-15 Units Subcutaneous TID WC  . insulin glargine  10 Units Subcutaneous QHS  . lactulose  30 g Oral Daily  . polyethylene glycol  17 g Oral Daily  . primidone  50 mg Oral Daily  . traZODone  100 mg Oral QHS  . zinc oxide   Topical TID    PRN Medications: acetaminophen, lactulose, magnesium hydroxide, traMADol   Assessment/ Plan:    Pt is a 74 y.o. yo female with a PMHx of CKD, T2DM, HTN, fibromyalgia, and CAD s/p CABGx4 who was admitted on 07/26/2014 with symptoms of SOB, which was determined to be secondary to bilateral pleural effusions. Interventions at  this time will be focused on managing SOB and preparing for likely discharge tomorrow.   Pleural Effusions: Effusions are likely the cause of SOB. Echo 12/21 showed EF 45-50%, an improvement from pre-bypass tests. Repeat echo 2/1 showed EF 55-60%. CXR and CTA chest on admission 07/26/14 revealed bilateral pleural effusions. Pt underwent Right thoracenteses on 2/1 with 1L removed. Left thoracentesis on 2/2 with 1L of exudative fluid removed. Pleural fluid analysis showed LDH ratio 0.82, protein ratio 0.77, no eosinophils). These are late postcardiotomy effusions that arose after CABG; treatment is NSAID. Not CHF, so lasix no longer required. Sats remained above 88% with ambulation. -- repeat thoracentesis -- reduce ibuprofen to 600mg  PO TID for 14 days -- reevaluate SOB tomorrow  CKD/AKI risk: SCr 1.50 on admission; 2.43 yesterday. 2.01 today.  -- repeat BMP in the morning -- pt to remain off lasix and lisinopril during admission and after discharge.   Constipation: 4 days of constipation, now resolved. -- continue miralax daily -- continue milk of magnesia PRN  CAD s/p CABG: on 40mg  atorvastatin (Lipitor) and 325mg  Aspirin at home -- continue Lipitor, Aspirin -- discontinue lisinopril as above  HTN: Takes 10mg  amlodipine (Norvasc), 40mg  lasix, 75mg  hydralazine TID at home -- continue home amlodipine and hydralazine -- discontinue lasix as above  T2DM: Sugars had been running 80-150 at home. She is treated with humalog TID and Lantus. CBG have been 92-130 over the past 24hrs. -- continue SSI Humalog 8u TID with meals; Lantus 10u qHS -- continue to monitor daily BG TID before meals and at bedtime  Fibromyalgia: treated with 100mg  trazadone, 50mg  primidone (Mysoline), and 50mg  tramadol daily + 50mg  tramadol q6H PRN at home -- continue trazadone, primidone, and tramadol  FEN/GI:  -- carb modified diet  PPx: -- subcutaneous heparin 5,000 units q8H  Code: FULL CODE -- contact  daughter, Lovie Chol [id number]) or son, Delcenia Sabater [phone]) in the event that patient is unable to make medical decisions  Consults placed: -- PT: recommendations below -- OT: recommendations below -- CT Surgery: agree w/ plans for echo and thoracentesis  Dispo: Likely discharge tomorrow. Pt would prefer to be discharged directly to home rather than SNF.  The patient does have a current PCP (Dr. Bailey Mech and Jacklynn Bue in [place]) and Cardiologist (Dr. Sherren Mocha, and Dr. Nils Pyle, cardiothoracic surgery). Need for an [facility] hospital follow-up appointment after discharge TBD.  Unknown if patient will have transportation limitations that hinder transportation to clinic appointments.  SERVICE NEEDED AT DISCHARGE - TO  BE DETERMINED DURING HOSPITAL COURSE  Y = Yes, Blank = No PT: yes - home health; intermittent supervision  OT: yes - home health; intermittent supervision  RN: yes - home health  Equipment:   Other:     Length of Stay: 4 day(s)   Signed: Louisa Second, Med Student  Pager: [phone] (7AM-5PM) 07/30/2014, 8:49 AM

## 2014-07-30 NOTE — Discharge Instructions (Addendum)
·   Thank you for allowing [place] to be involved in your healthcare while you were hospitalized at [facility].   Please note that there have been changes to your home medications.  --> PLEASE LOOK AT YOUR DISCHARGE MEDICATION LIST FOR DETAILS.   Please call your PCP if you have any questions or concerns, or any difficulty getting any of your medications.  Please return to the ER if you have worsening of your symptoms or new severe symptoms arise.  Do not take your Lasix or potassium at discharge to allow your kidneys to improve. Do not take lisinopril either. Please follow-up with your PCP to see when you should resume these medications.  Your shortness of breath should continue to improve.  Continue to take ibuprofen 600 mg three times a day for 12 more days.  Make sure to follow up with Cardiothoracic Surgery and your PCP to check your breathing status.

## 2014-07-30 NOTE — Procedures (Signed)
Successful US guided right thoracentesis. Yielded 1.3L of clear yellow fluid. Pt tolerated procedure well. No immediate complications.  There is also moderate sized recurrent effusion on the left.  Specimen was not sent for labs. CXR ordered.  Ascencion Dike PA-C 07/30/2014 2:46 PM

## 2014-07-30 NOTE — Progress Notes (Addendum)
[address]       [place] [address]             [phone]          Subjective: Feels ok, conts to overall feel better  Objective: Vital signs in last 24 hours: Temp:  [98 F (36.7 C)-98.4 F (36.9 C)] 98.4 F (36.9 C) (02/04 0437) Pulse Rate:  [79-90] 79 (02/04 0437) Cardiac Rhythm:  [-] Normal sinus rhythm (02/03 2200) Resp:  [18] 18 (02/04 0437) BP: (129-135)/(54-57) 135/55 mmHg (02/04 0437) SpO2:  [96 %-97 %] 96 % (02/04 0437) Weight:  [193 lb 2 oz (87.6 kg)] 193 lb 2 oz (87.6 kg) (02/04 0437)  Hemodynamic parameters for last 24 hours:    Intake/Output from previous day: 02/03 0701 - 02/04 0700 In: 1128.3 [P.O.:720; IV Piggyback:408.3] Out: 3002 [Urine:3000; Stool:2] Intake/Output this shift:    General appearance: alert, cooperative and no distress Heart: regular rate and rhythm Lungs: improved air movement in bases Abdomen: benign Extremities: minor edema Wound: incis healing well  Lab Results:  Recent Labs  07/28/14 0415  WBC 3.4*  HGB 9.4*  HCT 28.1*  PLT 175   BMET:  Recent Labs  07/29/14 0336 07/30/14 0500  NA 134* 134*  K 4.5 4.4  CL 98 100  CO2 30 26  GLUCOSE 112* 113*  BUN 36* 39*  CREATININE 2.43* 2.01*  CALCIUM 8.2* 8.3*    PT/INR: No results for input(s): LABPROT, INR in the last 72 hours. ABG    Component Value Date/Time   PHART 7.295* 06/15/2014 2013   HCO3 19.7* 06/15/2014 2013   TCO2 18 06/16/2014 1712   ACIDBASEDEF 6.0* 06/15/2014 2013   O2SAT 97.0 06/15/2014 2013   CBG (last 3)   Recent Labs  07/29/14 1640 07/29/14 2053 07/30/14 0615  GLUCAP 92 130* 114*    Meds Scheduled Meds: . amLODipine  10 mg Oral Daily  . aspirin EC  325 mg Oral Daily  . atorvastatin  40 mg Oral q1800  . feeding supplement (GLUCERNA SHAKE)  237 mL Oral Daily  . ferrous Q000111Q C-folic acid  1 capsule Oral Q breakfast  . heparin  5,000 Units Subcutaneous 3 times per day  . hydrALAZINE  75 mg  Oral TID  . ibuprofen  800 mg Oral 3 times per day  . insulin aspart  0-15 Units Subcutaneous TID WC  . insulin glargine  10 Units Subcutaneous QHS  . lactulose  30 g Oral Daily  . polyethylene glycol  17 g Oral Daily  . primidone  50 mg Oral Daily  . traZODone  100 mg Oral QHS  . zinc oxide   Topical TID   Continuous Infusions:  PRN Meds:.acetaminophen, lactulose, magnesium hydroxide, traMADol  Xrays Dg Chest 1 View  07/28/2014   CLINICAL DATA:  Post left-sided thoracentesis.  EXAM: CHEST - 1 VIEW  COMPARISON:  07/28/2014 at 6:12 a.m.  FINDINGS: Decreased left pleural effusion post thoracentesis. No postoperative air leak.  Unchanged small to moderate right pleural effusion from earlier today. Bibasilar atelectasis is also unchanged. Stable heart size and mediastinal contours post CABG. No pulmonary edema noted in the upper lungs.  IMPRESSION: No postoperative pneumothorax.   Electronically Signed   By: Jorje Guild M.D.   On: 07/28/2014 13:53   Dg Chest 2 View  07/29/2014   CLINICAL DATA:  F/u eval post open heart surgery and issues with pleural effusion - pt states no issues with  SOB or CP today  EXAM: CHEST - 2 VIEW  COMPARISON:  the previous day's study  FINDINGS: Previous CABG. Persistent bilateral pleural effusions. Patchy airspace opacities in both lung bases, slightly increased on the left since prior study. Heart size within normal limits for technique. Cervical fixation hardware noted. Spurring in the lower thoracic spine.  IMPRESSION: 1. Persistent bilateral pleural effusions. 2. Worsening left lower lobe airspace disease.   Electronically Signed   By: Arne Cleveland M.D.   On: 07/29/2014 08:40   US Thoracentesis Asp Pleural Space W/img Guide  07/28/2014   CLINICAL DATA:  Shortness of breath, left-sided pleural effusion. Request diagnostic and therapeutic thoracentesis.  EXAM: ULTRASOUND GUIDED LEFT THORACENTESIS  COMPARISON:  Right thoracentesis performed 07/27/2014  PROCEDURE: An  ultrasound guided thoracentesis was thoroughly discussed with the patient and questions answered. The benefits, risks, alternatives and complications were also discussed. The patient understands and wishes to proceed with the procedure. Written consent was obtained.  Ultrasound was performed to localize and mark an adequate pocket of fluid in the left chest. The area was then prepped and draped in the normal sterile fashion. 1% Lidocaine was used for local anesthesia. Under ultrasound guidance a 19 gauge Yueh catheter was introduced. Thoracentesis was performed. The catheter was removed and a dressing applied.  COMPLICATIONS: None.  Postprocedural chest x-ray negative for pneumothorax.  The patient did develop significant cough reflex and procedure had to be terminated prematurely.  FINDINGS: A total of approximately 1 L of clear, amber colored fluid was removed. A fluid sample wassent for laboratory analysis.  IMPRESSION: Successful ultrasound guided left thoracentesis yielding 1 L of pleural fluid.  Read by: Ascencion Dike PA-C   Electronically Signed   By: Markus Daft M.D.   On: 07/28/2014 14:02    Assessment/Plan:  1 conts to clinically improve . 2 Would be cautious with NSAIDs in this clinical scenario. We see these effusions post operatively with some frequency and it is a fairly benign process that mostly resolves without them. Her creat has improved so pre renal azotemia may be a component but nephrotoxicity  is  still a concern.  3 push pulm toilet/regab modalities 4 should be stable for discharge if medicine agrees    LOS: 4 days    GOLD,[name] 07/30/2014  Patient examined and todays CXR personally reviewed Postop CABG pleural effusions due to inflammation occur on L side from LIMA harvest- I doubt high dose NSAID will help and couild impair renal fx and result in fluid retention. Would rec reducing dose I will see pt in a week with CXR  after DC

## 2014-07-30 NOTE — Progress Notes (Addendum)
Subjective:    She reports that her breathing is about stable from yesterday, but she was very dyspneic after getting up to brush her teeth off of oxygen. She is able to urinate normally yesterday. She denies any chest pain or other issues.  Interval Events: -Stopped lisinopril and gave 500 mL bolus yesterday. -Continued ibuprofen 800 mg every 8 hours. -Creatinine improved to 2.01 this morning. -Ambulated with sats, 93% room air resting, 92% with ambulation.   Objective:    Vital Signs:   Temp:  [98 F (36.7 C)-98.4 F (36.9 C)] 98.4 F (36.9 C) (02/04 0437) Pulse Rate:  [79-90] 79 (02/04 0437) Resp:  [18] 18 (02/04 0437) BP: (129-135)/(54-57) 135/55 mmHg (02/04 0437) SpO2:  [96 %-97 %] 96 % (02/04 0437) Weight:  [193 lb 2 oz (87.6 kg)] 193 lb 2 oz (87.6 kg) (02/04 0437) Last BM Date: 07/29/14  24-hour weight change: Weight change:   Intake/Output:   Intake/Output Summary (Last 24 hours) at 07/30/14 0758 Last data filed at 07/30/14 0617  Gross per 24 hour  Intake 1128.33 ml  Output   3002 ml  Net -1873.67 ml      Physical Exam: General: Vital signs reviewed and noted. Well-developed, well-nourished, in no acute distress; alert, appropriate and cooperative throughout examination.  Lungs:  Breathing comfortably.  Bilateral crackles with decreased breath sounds at bilateral lung bases, slightly improved.   Heart: RRR. S1 and S2 normal without gallop, murmur, or rubs.  Abdomen:  BS normoactive. Soft, Nondistended, non-tender.  No masses or organomegaly.  Extremities: No edema.     Labs:  Basic Metabolic Panel:  Recent Labs Lab 07/26/14 1015 07/27/14 0457 07/28/14 0415 07/29/14 0336 07/30/14 0500  NA 134* 136 134* 134* 134*  K 4.0 4.3 4.2 4.5 4.4  CL 101 100 100 98 100  CO2 25 32 28 30 26   GLUCOSE 108* 106* 115* 112* 113*  BUN 27* 27* 30* 36* 39*  CREATININE 1.50* 1.62* 1.82* 2.43* 2.01*  CALCIUM 8.9 8.8 8.2* 8.2* 8.3*  MG  --  1.6 2.0  --   --   PHOS   --  4.6  --   --   --     Liver Function Tests:  Recent Labs Lab 07/27/14 0457 07/28/14 0415  AST 17  --   ALT 13  --   ALKPHOS 60  --   BILITOT 0.4  --   PROT 5.3* 4.8*  ALBUMIN 2.9*  --    CBC:  Recent Labs Lab 07/26/14 1015 07/27/14 0457 07/28/14 0415  WBC 4.0 3.4* 3.4*  NEUTROABS 2.6  --   --   HGB 9.9* 9.1* 9.4*  HCT 30.3* 27.7* 28.1*  MCV 84.6 83.9 85.7  PLT 190 171 175    CBG:  Recent Labs Lab 07/29/14 0555 07/29/14 1132 07/29/14 1640 07/29/14 2053 07/30/14 0615  GLUCAP 122* 142* 61 130* 114*    Microbiology: Results for orders placed or performed during the hospital encounter of 07/26/14  MRSA PCR Screening     Status: None   Collection Time: 07/26/14  7:04 PM  Result Value Ref Range Status   MRSA by PCR NEGATIVE NEGATIVE Final    Comment:        The GeneXpert MRSA Assay (FDA approved for NASAL specimens only), is one component of a comprehensive MRSA colonization surveillance program. It is not intended to diagnose MRSA infection nor to guide or monitor treatment for MRSA infections.   Body fluid culture  Status: None (Preliminary result)   Collection Time: 07/28/14  1:34 PM  Result Value Ref Range Status   Specimen Description FLUID PLEURAL LEFT  Final   Special Requests NONE  Final   Gram Stain   Final    FEW WBC PRESENT, PREDOMINANTLY MONONUCLEAR NO ORGANISMS SEEN Performed at [facility]    Culture NO GROWTH Performed at [facility]   Final   Report Status PENDING  Incomplete    Coagulation Studies: No results for input(s): LABPROT, INR in the last 72 hours.   Imaging: Dg Chest 1 View  07/28/2014   CLINICAL DATA:  Post left-sided thoracentesis.  EXAM: CHEST - 1 VIEW  COMPARISON:  07/28/2014 at 6:12 a.m.  FINDINGS: Decreased left pleural effusion post thoracentesis. No postoperative air leak.  Unchanged small to moderate right pleural effusion from earlier today. Bibasilar atelectasis is also unchanged.  Stable heart size and mediastinal contours post CABG. No pulmonary edema noted in the upper lungs.  IMPRESSION: No postoperative pneumothorax.   Electronically Signed   By: Jorje Guild M.D.   On: 07/28/2014 13:53   Dg Chest 2 View  07/29/2014   CLINICAL DATA:  F/u eval post open heart surgery and issues with pleural effusion - pt states no issues with SOB or CP today  EXAM: CHEST - 2 VIEW  COMPARISON:  the previous day's study  FINDINGS: Previous CABG. Persistent bilateral pleural effusions. Patchy airspace opacities in both lung bases, slightly increased on the left since prior study. Heart size within normal limits for technique. Cervical fixation hardware noted. Spurring in the lower thoracic spine.  IMPRESSION: 1. Persistent bilateral pleural effusions. 2. Worsening left lower lobe airspace disease.   Electronically Signed   By: Arne Cleveland M.D.   On: 07/29/2014 08:40   US Thoracentesis Asp Pleural Space W/img Guide  07/28/2014   CLINICAL DATA:  Shortness of breath, left-sided pleural effusion. Request diagnostic and therapeutic thoracentesis.  EXAM: ULTRASOUND GUIDED LEFT THORACENTESIS  COMPARISON:  Right thoracentesis performed 07/27/2014  PROCEDURE: An ultrasound guided thoracentesis was thoroughly discussed with the patient and questions answered. The benefits, risks, alternatives and complications were also discussed. The patient understands and wishes to proceed with the procedure. Written consent was obtained.  Ultrasound was performed to localize and mark an adequate pocket of fluid in the left chest. The area was then prepped and draped in the normal sterile fashion. 1% Lidocaine was used for local anesthesia. Under ultrasound guidance a 19 gauge Yueh catheter was introduced. Thoracentesis was performed. The catheter was removed and a dressing applied.  COMPLICATIONS: None.  Postprocedural chest x-ray negative for pneumothorax.  The patient did develop significant cough reflex and procedure  had to be terminated prematurely.  FINDINGS: A total of approximately 1 L of clear, amber colored fluid was removed. A fluid sample wassent for laboratory analysis.  IMPRESSION: Successful ultrasound guided left thoracentesis yielding 1 L of pleural fluid.  Read by: Ascencion Dike PA-C   Electronically Signed   By: Markus Daft M.D.   On: 07/28/2014 14:02       Medications:    Infusions:    Scheduled Medications: . amLODipine  10 mg Oral Daily  . aspirin EC  325 mg Oral Daily  . atorvastatin  40 mg Oral q1800  . feeding supplement (GLUCERNA SHAKE)  237 mL Oral Daily  . ferrous Q000111Q C-folic acid  1 capsule Oral Q breakfast  . heparin  5,000 Units Subcutaneous 3 times per day  .  hydrALAZINE  75 mg Oral TID  . ibuprofen  800 mg Oral 3 times per day  . insulin aspart  0-15 Units Subcutaneous TID WC  . insulin glargine  10 Units Subcutaneous QHS  . lactulose  30 g Oral Daily  . polyethylene glycol  17 g Oral Daily  . primidone  50 mg Oral Daily  . traZODone  100 mg Oral QHS  . zinc oxide   Topical TID    PRN Medications: acetaminophen, lactulose, magnesium hydroxide, traMADol   Assessment/ Plan:    Principal Problem:   Exudative pleural effusion Active Problems:   Chronic kidney disease (CKD), stage III (moderate)   HTN (hypertension)   Diabetes mellitus type 2 in obese   S/P CABG x 4   Diastolic CHF, chronic   Normocytic anemia   Leukopenia  #Post-thoracotomy pleural effusions Breathing stable, but she is having significant shortness of breath with exertion. No desaturation with ambulation status and this is not an oxygen issue. This is most likely due to the residual fluid in her lungs, and she may benefit from additional thoracentesis. However, there may also be a component of deconditioning that will need to be addressed as an outpatient.  Discussed our thoughts with the patient, and she was amenable to undergoing an additional thora because she is concerned  about going home without oxygen.  Kidneys improving despite ongoing NSAID use. After reviewing the risks and benefits of continuing NSAIDs, we decided to continue with the reduced dose of ibuprofen to help her effusions resolved and prevent reaccumulation. -Reduce ibuprofen to 600 mg q8h for 14 days, day 2 today. -Ordered repeat thoracentesis by IR today.  Will reassess tomorrow. -Scheduled follow-up with cardiothoracic surgery and PCP at discharge. -Unable to get oxygen at home.  #Acute kidney injury Creatinine improving today with mild rehydration and stopping lisinopril despite continued use of NSAIDs. We do not think the NSAIDs are the cause of her AKI, and think continuing them at a reduced dose is reasonable to address her effusions. -Continue to hold Lasix and lisinopril. -Will make sure she doesn't continue Lasix at discharge. -Reassess need for Lasix and lisinopril at hospital follow-up, though Lasix likely not needed because she does not have heart failure.  #Constipation Improved. -Continue lactulose 30 g daily and 20 g PRN. -Continue Miralax 17 g daily. -MoM PRN.  #CAD s/p CABG -Continue aspirin 325 mg daily. -Continue atorvastatin.  #HTN Normotensive off of lisinopril. -Continue amlodipine 10 mg daily, hydralazine 75 mg TID.  #DM2 CBGs stable. -CBGs ACHS. -SSI-M. -Continue Lantus 10 units QHS. -We'll restart home insulin regimen at discharge.  #Fibromyalgia -Continue primidone and tramadol 50 mg q6h PRN.   DVT PPX - heparin  CODE STATUS - Full  CONSULTS PLACED - Cardiothoracic surgery  DISPO - Possible discharge home tomorrow post-thoracentesis.  The patient does not have a current PCP Allie Dimmer, MD) and does need an [facility] hospital follow-up appointment after discharge.    Is the [facility] hospital follow-up appointment a one-time only appointment? yes.  Does the patient have transportation limitations that hinder transportation to clinic appointments?  yes   SERVICE NEEDED AT [facility]         Y = Yes, Blank = No PT: Home health  OT: Home health  RN: Home health  Equipment:   Other:      Length of Stay: 4 day(s)   Signed: Arman Filter, MD  PGY-1, Internal Medicine Resident Pager: [phone] (7AM-5PM)  07/30/2014, 7:58 AM

## 2014-07-30 NOTE — Progress Notes (Signed)
Internal Medicine Attending  Date: 07/30/2014  Patient name: Joyce [name] Medical record number: [id number] Date of birth: [birth year] Age: 74 y.o. Gender: female  I saw and evaluated the patient. I reviewed the resident's note by Dr. Trudee Kuster and I agree with the resident's findings and plans as documented in his progress note.  When [name] was seen on rounds this AM she was much more dyspneic with exertion than 1 day prior.  She does not qualify for home O2 therapy given resting and ambulatory pulse oximetry.  I suspect the continued dyspnea is related to the still significant bilateral pleural effusions.  We will ask for a thoracentesis today to see if this improves her dyspnea further before discharge.  We will continue the NSAID therapy in hopes of limiting or halting the reaccumulation of the large non-specific late post-pericardotomy exudative pleural effusions, but will lower the dose of ibuprofen to 600 mg Q8H.  She was instructed on incentive spirometry technique and we will continue to avoid other nephrotoxins or renal insults.  If she is symptomatically improved tomorrow morning we will consider discharge home with home PT and follow-up in Thoracic Surgery.

## 2014-07-30 NOTE — Progress Notes (Signed)
SATURATION QUALIFICATIONS:   Patient Saturations on Room Air at Rest = 93%  Patient Saturations on Room Air while Ambulating = 92%  Patient Saturations on 2L Liters of oxygen while Ambulating = 96%

## 2014-07-31 LAB — BASIC METABOLIC PANEL
Anion gap: 7 (ref 5–15)
BUN: 31 mg/dL — ABNORMAL HIGH (ref 6–23)
CO2: 27 mmol/L (ref 19–32)
Calcium: 8.6 mg/dL (ref 8.4–10.5)
Chloride: 105 mmol/L (ref 96–112)
Creatinine, Ser: 1.79 mg/dL — ABNORMAL HIGH (ref 0.50–1.10)
GFR calc Af Amer: 32 mL/min — ABNORMAL LOW (ref >90)
GFR calc non Af Amer: 27 mL/min — ABNORMAL LOW (ref >90)
Glucose, Bld: 146 mg/dL — ABNORMAL HIGH (ref 70–99)
POTASSIUM: 4.4 mmol/L (ref 3.5–5.1)
SODIUM: 139 mmol/L (ref 135–145)

## 2014-07-31 LAB — OTHER BODY FLUID CHEMISTRY

## 2014-07-31 LAB — UREA NITROGEN, URINE: UREA NITROGEN UR: 268 mg/dL

## 2014-07-31 LAB — GLUCOSE, CAPILLARY
Glucose-Capillary: 145 mg/dL — ABNORMAL HIGH (ref 70–99)
Glucose-Capillary: 129 mg/dL — ABNORMAL HIGH (ref 70–99)

## 2014-07-31 MED ORDER — IBUPROFEN 600 MG PO TABS: 600.0000 mg | ORAL_TABLET | Freq: Three times a day (TID) | ORAL | Status: DC

## 2014-07-31 NOTE — Progress Notes (Signed)
Subjective:    Joyce [name] is doing well this mornnig. No acute events overnight. Her SOB has greatly improve s/p thoracentesis yesterday. She intermittently uses 2L [place] O2, sometimes on room air. She walked with the nurse down the hall with 2L [place] O2 and felt fine; plans to try walking more without O2 today. She denies chest pain. She has had some leg pain, but attributes that to deconditioning. She has no more complaints of constipation or problems with urination. She has no other concerns at this time regarding discharge, feels able to go home.   Interval Events: -- R-sided thoracentesis 07/30/14 yielded 1.3L, sent for labs -- post-thoracentesis CXR showed no signs of pneumothorax, small effusion on left -- creatinine improved to 1.79   Objective:    Vital Signs:   Temp:  [98.2 F (36.8 C)] 98.2 F (36.8 C) (02/05 0454) Pulse Rate:  [71-97] 71 (02/05 0827) Resp:  [18-19] 18 (02/05 0827) BP: (126-145)/(48-67) 126/54 mmHg (02/05 0827) SpO2:  [93 %-98 %] 98 % (02/05 0827) Last BM Date: 07/29/14  24-hour weight change: Weight change: n/a  Intake/Output:   Intake/Output Summary (Last 24 hours) at 07/31/14 1039 Last data filed at 07/31/14 0800  Gross per 24 hour  Intake    720 ml  Output   2000 ml  Net  -1280 ml      Physical Exam: General: well-appearing, well-nourished. sitting in chair, NAD HEENT: EOMI, nares patent CV: RRR, normal S1 S2, no m/r/g Pulm: inspiratory crackles in left base. normal work of breathing, no wheezes Ext: no edema Skin: warm, well perfused.  Labs:  Basic Metabolic Panel:  Recent Labs Lab 07/27/14 0457 07/28/14 0415  07/31/14 0548  NA 136 134*  < > 139  K 4.3 4.2  < > 4.4  CL 100 100  < > 105  CO2 32 28  < > 27  GLUCOSE 106* 115*  < > 146*  BUN 27* 30*  < > 31*  CREATININE 1.62* 1.82*  < > 1.79*  CALCIUM 8.8 8.2*  < > 8.6  MG 1.6 2.0  --   --   PHOS 4.6  --   --   --   < > = values in this interval not displayed.  CBG (last 3)     Recent Labs  07/30/14 1607 07/30/14 2109 07/31/14 0555  GLUCAP 166* 130* 145*    Imaging: Dg Chest 1 View  07/30/2014   CLINICAL DATA:  Status post right-sided thoracentesis  EXAM: CHEST  1 VIEW  COMPARISON:  July 30, 2014 study obtained earlier in the day  FINDINGS: No demonstrable pneumothorax. Right pleural effusion is slightly smaller. There is consolidation with small effusion on the left. There is a small effusion on the right with mild atelectasis. Heart is mildly enlarged with pulmonary vascularity within normal limits. Patient is status post coronary artery bypass grafting. No adenopathy.  IMPRESSION: No pneumothorax. Less effusion on the right. Persistent left lower lobe consolidation with small effusion. Heart prominent but stable.   Electronically Signed   By: Lowella Grip M.D.   On: 07/30/2014 15:21   Dg Chest 2 View  07/30/2014   CLINICAL DATA:  Short of breath, history of pleural effusions  EXAM: CHEST  2 VIEW  COMPARISON:  Chest x-ray of 07/29/2014  FINDINGS: Aeration of the lungs has improved slightly. Bibasilar opacities remain consistent with atelectasis and effusions. Pneumonia cannot be excluded particularly at the left lung base. Mild cardiomegaly is stable. Median sternotomy  sutures are noted from CABG.  IMPRESSION: Slightly better aeration. Persistent bibasilar opacities consistent with atelectasis and effusions. Cannot exclude pneumonia particularly at the left lung base.   Electronically Signed   By: Ivar Drape M.D.   On: 07/30/2014 08:10   US Thoracentesis Asp Pleural Space W/img Guide  07/30/2014   CLINICAL DATA:  Recurrent pleural effusions. Request therapeutic thoracentesis  EXAM: ULTRASOUND GUIDED RIGHT THORACENTESIS  COMPARISON:  Previous thoracentesis performed 2/1(right) and 2/2(left)  PROCEDURE: An ultrasound guided thoracentesis was thoroughly discussed with the patient and questions answered. The benefits, risks, alternatives and complications were also  discussed. The patient understands and wishes to proceed with the procedure. Written consent was obtained.  Ultrasound of bilateral chest demonstrates bilateral moderate pleural effusions. Right side is chosen for thoracentesis.  Ultrasound was performed to localize and mark an adequate pocket of fluid in the right chest. The area was then prepped and draped in the normal sterile fashion. 1% Lidocaine was used for local anesthesia. Under ultrasound guidance a 19 gauge Yueh catheter was introduced. Thoracentesis was performed. The catheter was removed and a dressing applied.  Complications:  None  FINDINGS: A total of approximately 1.3 L of clear yellow fluid was removed. A fluid sample was notsent for laboratory analysis.  IMPRESSION: Successful ultrasound guided right thoracentesis yielding 1.3 L of pleural fluid.  Read by: Ascencion Dike PA-C   Electronically Signed   By: Markus Daft M.D.   On: 07/30/2014 14:51       Medications:    Infusions:    Scheduled Medications: . amLODipine  10 mg Oral Daily  . aspirin EC  325 mg Oral Daily  . atorvastatin  40 mg Oral q1800  . feeding supplement (GLUCERNA SHAKE)  237 mL Oral Daily  . ferrous Q000111Q C-folic acid  1 capsule Oral Q breakfast  . heparin  5,000 Units Subcutaneous 3 times per day  . hydrALAZINE  75 mg Oral TID  . ibuprofen  600 mg Oral 3 times per day  . insulin aspart  0-15 Units Subcutaneous TID WC  . insulin glargine  10 Units Subcutaneous QHS  . lactulose  30 g Oral Daily  . polyethylene glycol  17 g Oral Daily  . primidone  50 mg Oral Daily  . traZODone  100 mg Oral QHS  . zinc oxide   Topical TID    PRN Medications: acetaminophen, lactulose, magnesium hydroxide, traMADol   Assessment/ Plan:    Pt is a 74 y.o. yo female with a PMHx of CKD, T2DM, HTN, fibromyalgia, and CAD s/p CABGx4 who was admitted on 07/26/2014 with symptoms of SOB, which was determined to be secondary to bilateral pleural effusions.  Interventions at this time will be focused on managing SOB and preparing for discharge likely today.   Post-thoracotomy Pleural Effusions: Effusions are likely the cause of SOB. Echo 12/21 showed EF 45-50%, an improvement from pre-bypass tests. Repeat echo 2/1 showed EF 55-60%. CXR and CTA chest on admission 07/26/14 revealed bilateral pleural effusions. Pt underwent Right thoracenteses on 2/1 with 1L removed. Left thoracentesis on 2/2 with 1L of exudative fluid removed. Pleural fluid analysis showed LDH ratio 0.82, protein ratio 0.77, no eosinophils). These are late postcardiotomy effusions that arose after CABG; treatment is NSAID. Not CHF, so lasix no longer required.  -- continue ibuprofen to 600mg  PO TID for 14 days (currently day 3) -- encourage ambulation and incentive spirometry -- follow-up with cardiothoracic surgery and PCP following discharge  CKD/AKI risk: SCr  1.50 on admission; 2.01 yesterday. 1.79 today. Kidney function improving despite NSAID use -- remain off lasix and lisinopril during admission and after discharge.   Constipation: 4 days of constipation, now resolved. -- continue miralax daily -- continue milk of magnesia PRN  CAD s/p CABG: on 40mg  atorvastatin (Lipitor) and 325mg  Aspirin at home -- continue Lipitor, Aspirin -- discontinue lisinopril as above  HTN: Takes 10mg  amlodipine (Norvasc), 40mg  lasix, 75mg  hydralazine TID at home. BP 136-145/48-67 over past 24 hours. Pt asymptomatic. -- continue home amlodipine and hydralazine -- discontinue lasix as above  T2DM: Sugars had been running 80-150 at home. She is treated with humalog TID and Lantus. CBG have been 107-166 over the past 24hrs. -- continue SSI Humalog 8u TID with meals; Lantus 10u qHS -- continue to monitor daily BG TID before meals and at bedtime  Fibromyalgia: treated with 100mg  trazadone, 50mg  primidone (Mysoline), and 50mg  tramadol daily + 50mg  tramadol q6H PRN at home -- continue trazadone,  primidone, and tramadol  FEN/GI:  -- carb modified diet  PPx: -- subcutaneous heparin 5,000 units q8H. D/c at discharge  Code: FULL CODE -- contact daughter, Joyce [name] [id number]) or son, Joyce [name] [phone]) in the event that patient is unable to make medical decisions  Consults placed: -- PT: recommendations below -- OT: recommendations below -- CT Surgery: agree w/ plans for echo and thoracentesis  Dispo: Likely discharge tomorrow. Pt would prefer to be discharged directly to home rather than SNF.  The patient does have a current PCP (Dr. Bailey Mech and Jacklynn Bue in [place]) and Cardiologist (Dr. Sherren Mocha, and Dr. Nils Pyle, cardiothoracic surgery).Will need [facility] hospital follow-up appointment after discharge: PCP 2/12, CT Surgery TBD.  Unknown if patient will have transportation limitations that hinder transportation to clinic appointments.  SERVICE NEEDED AT [place]  Y = Yes, Blank = No PT: yes - home health; intermittent supervision  OT: yes - home health; intermittent supervision  RN: yes - home health  Equipment:   Other:     Length of Stay: 5 day(s)   Signed: Louisa Second, Med Student  Pager: [phone] (7AM-5PM) 07/31/2014, 10:39 AM

## 2014-07-31 NOTE — Progress Notes (Signed)
[address]       [place] [address]             [phone]          Subjective: Feels symptomatically much better today  Objective: Vital signs in last 24 hours: Temp:  [98.2 F (36.8 C)] 98.2 F (36.8 C) (02/05 0454) Pulse Rate:  [87-97] 90 (02/05 0454) Cardiac Rhythm:  [-] Normal sinus rhythm (02/04 1930) Resp:  [18-19] 18 (02/05 0454) BP: (130-145)/(48-67) 130/51 mmHg (02/05 0454) SpO2:  [93 %-98 %] 94 % (02/05 0454)  Hemodynamic parameters for last 24 hours:    Intake/Output from previous day: 02/04 0701 - 02/05 0700 In: 720 [P.O.:720] Out: 2000 [Urine:2000] Intake/Output this shift: Total I/O In: -  Out: 200 [Urine:200]  General appearance: alert, cooperative and no distress Heart: regular rate and rhythm Lungs: min dim in the bases Abdomen: benign Extremities: trace edema Wound: incis healing well  Lab Results: No results for input(s): WBC, HGB, HCT, PLT in the last 72 hours. BMET:  Recent Labs  07/30/14 0500 07/31/14 0548  NA 134* 139  K 4.4 4.4  CL 100 105  CO2 26 27  GLUCOSE 113* 146*  BUN 39* 31*  CREATININE 2.01* 1.79*  CALCIUM 8.3* 8.6    PT/INR: No results for input(s): LABPROT, INR in the last 72 hours. ABG    Component Value Date/Time   PHART 7.295* 06/15/2014 2013   HCO3 19.7* 06/15/2014 2013   TCO2 18 06/16/2014 1712   ACIDBASEDEF 6.0* 06/15/2014 2013   O2SAT 97.0 06/15/2014 2013   CBG (last 3)   Recent Labs  07/30/14 1607 07/30/14 2109 07/31/14 0555  GLUCAP 166* 130* 145*    Meds Scheduled Meds: . amLODipine  10 mg Oral Daily  . aspirin EC  325 mg Oral Daily  . atorvastatin  40 mg Oral q1800  . feeding supplement (GLUCERNA SHAKE)  237 mL Oral Daily  . ferrous Q000111Q C-folic acid  1 capsule Oral Q breakfast  . heparin  5,000 Units Subcutaneous 3 times per day  . hydrALAZINE  75 mg Oral TID  . ibuprofen  600 mg Oral 3 times per day  . insulin aspart  0-15 Units Subcutaneous  TID WC  . insulin glargine  10 Units Subcutaneous QHS  . lactulose  30 g Oral Daily  . polyethylene glycol  17 g Oral Daily  . primidone  50 mg Oral Daily  . traZODone  100 mg Oral QHS  . zinc oxide   Topical TID   Continuous Infusions:  PRN Meds:.acetaminophen, lactulose, magnesium hydroxide, traMADol  Xrays Dg Chest 1 View  07/30/2014   CLINICAL DATA:  Status post right-sided thoracentesis  EXAM: CHEST  1 VIEW  COMPARISON:  July 30, 2014 study obtained earlier in the day  FINDINGS: No demonstrable pneumothorax. Right pleural effusion is slightly smaller. There is consolidation with small effusion on the left. There is a small effusion on the right with mild atelectasis. Heart is mildly enlarged with pulmonary vascularity within normal limits. Patient is status post coronary artery bypass grafting. No adenopathy.  IMPRESSION: No pneumothorax. Less effusion on the right. Persistent left lower lobe consolidation with small effusion. Heart prominent but stable.   Electronically Signed   By: Lowella Grip M.D.   On: 07/30/2014 15:21   Dg Chest 2 View  07/30/2014   CLINICAL DATA:  Short of breath, history of pleural effusions  EXAM: CHEST  2 VIEW  COMPARISON:  Chest x-ray of 07/29/2014  FINDINGS: Aeration of the lungs has improved slightly. Bibasilar opacities remain consistent with atelectasis and effusions. Pneumonia cannot be excluded particularly at the left lung base. Mild cardiomegaly is stable. Median sternotomy sutures are noted from CABG.  IMPRESSION: Slightly better aeration. Persistent bibasilar opacities consistent with atelectasis and effusions. Cannot exclude pneumonia particularly at the left lung base.   Electronically Signed   By: Ivar Drape M.D.   On: 07/30/2014 08:10   Dg Chest 2 View  07/29/2014   CLINICAL DATA:  F/u eval post open heart surgery and issues with pleural effusion - pt states no issues with SOB or CP today  EXAM: CHEST - 2 VIEW  COMPARISON:  the previous day's  study  FINDINGS: Previous CABG. Persistent bilateral pleural effusions. Patchy airspace opacities in both lung bases, slightly increased on the left since prior study. Heart size within normal limits for technique. Cervical fixation hardware noted. Spurring in the lower thoracic spine.  IMPRESSION: 1. Persistent bilateral pleural effusions. 2. Worsening left lower lobe airspace disease.   Electronically Signed   By: Arne Cleveland M.D.   On: 07/29/2014 08:40   US Thoracentesis Asp Pleural Space W/img Guide  07/30/2014   CLINICAL DATA:  Recurrent pleural effusions. Request therapeutic thoracentesis  EXAM: ULTRASOUND GUIDED RIGHT THORACENTESIS  COMPARISON:  Previous thoracentesis performed 2/1(right) and 2/2(left)  PROCEDURE: An ultrasound guided thoracentesis was thoroughly discussed with the patient and questions answered. The benefits, risks, alternatives and complications were also discussed. The patient understands and wishes to proceed with the procedure. Written consent was obtained.  Ultrasound of bilateral chest demonstrates bilateral moderate pleural effusions. Right side is chosen for thoracentesis.  Ultrasound was performed to localize and mark an adequate pocket of fluid in the right chest. The area was then prepped and draped in the normal sterile fashion. 1% Lidocaine was used for local anesthesia. Under ultrasound guidance a 19 gauge Yueh catheter was introduced. Thoracentesis was performed. The catheter was removed and a dressing applied.  Complications:  None  FINDINGS: A total of approximately 1.3 L of clear yellow fluid was removed. A fluid sample was notsent for laboratory analysis.  IMPRESSION: Successful ultrasound guided right thoracentesis yielding 1.3 L of pleural fluid.  Read by: Ascencion Dike PA-C   Electronically Signed   By: Markus Daft M.D.   On: 07/30/2014 14:51    Assessment/Plan:  1 conts to improve, poss d/c today per medicine.    LOS: 5 days    GOLD,WAYNE  E 07/31/2014

## 2014-07-31 NOTE — Progress Notes (Signed)
Subjective:    She reports improvement of her breathing after thoracentesis yesterday. She tried to ambulate without oxygen, and she noted some leg weakness but thinks her breathing is improved. She was, going from today without home oxygen.  Interval Events: -Thoracentesis with drainage of 1.3 L of clear yellow fluid. -Post procedure x-ray unremarkable. -Ibuprofen decreased to 600 mg every 8 hours.   Objective:    Vital Signs:   Temp:  [98.2 F (36.8 C)] 98.2 F (36.8 C) (02/05 0454) Pulse Rate:  [71-97] 71 (02/05 0827) Resp:  [18-19] 18 (02/05 0827) BP: (126-145)/(48-67) 126/54 mmHg (02/05 0827) SpO2:  [94 %-98 %] 98 % (02/05 0827) Last BM Date: 07/29/14  24-hour weight change: Weight change:   Intake/Output:   Intake/Output Summary (Last 24 hours) at 07/31/14 1119 Last data filed at 07/31/14 0800  Gross per 24 hour  Intake    720 ml  Output   2000 ml  Net  -1280 ml      Physical Exam: General: Vital signs reviewed and noted. Well-developed, well-nourished, in no acute distress; alert, appropriate and cooperative throughout examination.  Lungs:  Breathing comfortably.  Bilateral crackles with decreased breath sounds at bilateral lung bases, improved from yesterday.   Heart: RRR. S1 and S2 normal without gallop, murmur, or rubs.  Abdomen:  BS normoactive. Soft, Nondistended, non-tender.  No masses or organomegaly.  Extremities: No edema.     Labs:  Basic Metabolic Panel:  Recent Labs Lab 07/27/14 0457 07/28/14 0415 07/29/14 0336 07/30/14 0500 07/31/14 0548  NA 136 134* 134* 134* 139  K 4.3 4.2 4.5 4.4 4.4  CL 100 100 98 100 105  CO2 32 28 30 26 27   GLUCOSE 106* 115* 112* 113* 146*  BUN 27* 30* 36* 39* 31*  CREATININE 1.62* 1.82* 2.43* 2.01* 1.79*  CALCIUM 8.8 8.2* 8.2* 8.3* 8.6  MG 1.6 2.0  --   --   --   PHOS 4.6  --   --   --   --     Liver Function Tests:  Recent Labs Lab 07/27/14 0457 07/28/14 0415  AST 17  --   ALT 13  --   ALKPHOS  60  --   BILITOT 0.4  --   PROT 5.3* 4.8*  ALBUMIN 2.9*  --    CBC:  Recent Labs Lab 07/26/14 1015 07/27/14 0457 07/28/14 0415  WBC 4.0 3.4* 3.4*  NEUTROABS 2.6  --   --   HGB 9.9* 9.1* 9.4*  HCT 30.3* 27.7* 28.1*  MCV 84.6 83.9 85.7  PLT 190 171 175    CBG:  Recent Labs Lab 07/30/14 0615 07/30/14 1146 07/30/14 1607 07/30/14 2109 07/31/14 0555  GLUCAP 114* 107* 166* 130* 145*    Microbiology: Results for orders placed or performed during the hospital encounter of 07/26/14  MRSA PCR Screening     Status: None   Collection Time: 07/26/14  7:04 PM  Result Value Ref Range Status   MRSA by PCR NEGATIVE NEGATIVE Final    Comment:        The GeneXpert MRSA Assay (FDA approved for NASAL specimens only), is one component of a comprehensive MRSA colonization surveillance program. It is not intended to diagnose MRSA infection nor to guide or monitor treatment for MRSA infections.   Body fluid culture     Status: None (Preliminary result)   Collection Time: 07/28/14  1:34 PM  Result Value Ref Range Status   Specimen Description FLUID PLEURAL LEFT  Final   Special Requests NONE  Final   Gram Stain   Final    FEW WBC PRESENT, PREDOMINANTLY MONONUCLEAR NO ORGANISMS SEEN Performed at [facility]    Culture   Final    NO GROWTH 2 DAYS Performed at [facility]    Report Status PENDING  Incomplete    Coagulation Studies: No results for input(s): LABPROT, INR in the last 72 hours.   Imaging: Dg Chest 1 View  07/30/2014   CLINICAL DATA:  Status post right-sided thoracentesis  EXAM: CHEST  1 VIEW  COMPARISON:  July 30, 2014 study obtained earlier in the day  FINDINGS: No demonstrable pneumothorax. Right pleural effusion is slightly smaller. There is consolidation with small effusion on the left. There is a small effusion on the right with mild atelectasis. Heart is mildly enlarged with pulmonary vascularity within normal limits. Patient is status  post coronary artery bypass grafting. No adenopathy.  IMPRESSION: No pneumothorax. Less effusion on the right. Persistent left lower lobe consolidation with small effusion. Heart prominent but stable.   Electronically Signed   By: Lowella Grip M.D.   On: 07/30/2014 15:21   Dg Chest 2 View  07/30/2014   CLINICAL DATA:  Short of breath, history of pleural effusions  EXAM: CHEST  2 VIEW  COMPARISON:  Chest x-ray of 07/29/2014  FINDINGS: Aeration of the lungs has improved slightly. Bibasilar opacities remain consistent with atelectasis and effusions. Pneumonia cannot be excluded particularly at the left lung base. Mild cardiomegaly is stable. Median sternotomy sutures are noted from CABG.  IMPRESSION: Slightly better aeration. Persistent bibasilar opacities consistent with atelectasis and effusions. Cannot exclude pneumonia particularly at the left lung base.   Electronically Signed   By: Ivar Drape M.D.   On: 07/30/2014 08:10   US Thoracentesis Asp Pleural Space W/img Guide  07/30/2014   CLINICAL DATA:  Recurrent pleural effusions. Request therapeutic thoracentesis  EXAM: ULTRASOUND GUIDED RIGHT THORACENTESIS  COMPARISON:  Previous thoracentesis performed 2/1(right) and 2/2(left)  PROCEDURE: An ultrasound guided thoracentesis was thoroughly discussed with the patient and questions answered. The benefits, risks, alternatives and complications were also discussed. The patient understands and wishes to proceed with the procedure. Written consent was obtained.  Ultrasound of bilateral chest demonstrates bilateral moderate pleural effusions. Right side is chosen for thoracentesis.  Ultrasound was performed to localize and mark an adequate pocket of fluid in the right chest. The area was then prepped and draped in the normal sterile fashion. 1% Lidocaine was used for local anesthesia. Under ultrasound guidance a 19 gauge Yueh catheter was introduced. Thoracentesis was performed. The catheter was removed and a  dressing applied.  Complications:  None  FINDINGS: A total of approximately 1.3 L of clear yellow fluid was removed. A fluid sample was notsent for laboratory analysis.  IMPRESSION: Successful ultrasound guided right thoracentesis yielding 1.3 L of pleural fluid.  Read by: Ascencion Dike PA-C   Electronically Signed   By: Markus Daft M.D.   On: 07/30/2014 14:51       Medications:    Infusions:    Scheduled Medications: . amLODipine  10 mg Oral Daily  . aspirin EC  325 mg Oral Daily  . atorvastatin  40 mg Oral q1800  . feeding supplement (GLUCERNA SHAKE)  237 mL Oral Daily  . ferrous Q000111Q C-folic acid  1 capsule Oral Q breakfast  . heparin  5,000 Units Subcutaneous 3 times per day  . hydrALAZINE  75 mg Oral TID  .  ibuprofen  600 mg Oral 3 times per day  . insulin aspart  0-15 Units Subcutaneous TID WC  . insulin glargine  10 Units Subcutaneous QHS  . lactulose  30 g Oral Daily  . polyethylene glycol  17 g Oral Daily  . primidone  50 mg Oral Daily  . traZODone  100 mg Oral QHS  . zinc oxide   Topical TID    PRN Medications: acetaminophen, lactulose, magnesium hydroxide, traMADol   Assessment/ Plan:    Principal Problem:   Exudative pleural effusion Active Problems:   Chronic kidney disease (CKD), stage III (moderate)   HTN (hypertension)   Diabetes mellitus type 2 in obese   S/P CABG x 4   Diastolic CHF, chronic   Normocytic anemia   Leukopenia   Dyspnea  #Post-thoracotomy pleural effusions Less dyspnea on room air after thoracentesis yesterday. She feels comfortable going home. Symptoms now most likely due to deconditioning. This will improve with PT. -Continue ibuprofen to 600 mg q8h for 14 days, day 3 today. Reassess need at follow-up appointment. -Follow-up with cardiothoracic surgery and PCP at discharge. -Unable to get oxygen at home. -Home health has been arranged.  #Acute kidney injury Creatinine continues to improve despite continued NSAID  use. Her GFR is now close to 30% and the likelihood of this threshold tomorrow. Benefits outweigh risk of continued NSAID use. -Continue to hold Lasix and lisinopril. -Will make sure she doesn't continue Lasix at discharge. -Reassess need for Lasix and lisinopril at hospital follow-up, though Lasix likely not needed because she does not have heart failure.  #Constipation Resolved. -Continue lactulose 30 g daily and 20 g PRN. -Continue Miralax 17 g daily. -MoM PRN.  #CAD s/p CABG -Continue aspirin 325 mg daily. -Continue atorvastatin.  #HTN Normotensive off of lisinopril. -Continue amlodipine 10 mg daily, hydralazine 75 mg TID.  #DM2 CBGs stable. -CBGs ACHS. -SSI-M. -Continue Lantus 10 units QHS. -We'll restart home insulin regimen at discharge.  #Fibromyalgia -Continue primidone and tramadol 50 mg q6h PRN.   DVT PPX - heparin  CODE STATUS - Full  CONSULTS PLACED - Cardiothoracic surgery  DISPO - Discharge home this afternoon.  The patient does not have a current PCP Allie Dimmer, MD) and does need an [facility] hospital follow-up appointment after discharge.    Is the [facility] hospital follow-up appointment a one-time only appointment? yes.  Does the patient have transportation limitations that hinder transportation to clinic appointments? yes   SERVICE NEEDED AT [place]         Y = Yes, Blank = No PT: Home health  OT: Home health  RN: Home health  Equipment:   Other:      Length of Stay: 5 day(s)   Signed: Arman Filter, MD  PGY-1, Internal Medicine Resident Pager: [phone] (7AM-5PM) 07/31/2014, 11:19 AM

## 2014-07-31 NOTE — Progress Notes (Signed)
  Date: 07/31/2014  Patient name: Joyce [name]  Medical record number: [id number]  Date of birth: [birth year]   This patient has been seen and the plan of care was discussed with the house staff. Please see their note for complete details. I concur with their findings with the following additions/corrections: Pt's breathing is much improved today and she was able to move around room without DOE. She is not wearing O2. She understands to take Ibuprofen with food. She will have F/U CVTS, PCP, and nephrology. D/C home today,   Bartholomew Crews, MD 07/31/2014, 2:20 PM

## 2014-07-31 NOTE — Progress Notes (Signed)
Pt d/c home.  Alert and oriented x4.  IV D/Cd.  No c/o pain or shob.  Pt has home health PT set up.   Pt given education on diet, activity, meds, and follow-up care and instructions.  Pt verbalized understanding.  Pt taken home by daughter.

## 2014-07-31 NOTE — Progress Notes (Signed)
Physical Therapy Treatment Patient Details Name: Joyce [name] MRN: [id number] DOB: 02-Oct-1940 Today's Date: 08-26-2014    History of Present Illness 74 yo female who was in rehab s/p CABG and now returns to hospital from rehab with B pleural effusions.  Pt with thoracentesis 2/1 and 2nd planned 2/2    PT Comments    Patient continued to fatigue and did not feel she could tolerate negotiating stairs today.  Feel she will be safe enough for d/c home and sister to be there over weekend and will have [facility] services starting Monday with daugther and son next door. Reinforced education on energy conservation.  Follow Up Recommendations  Home health PT;Supervision - Intermittent     Equipment Recommendations  None recommended by PT    Recommendations for Other Services       Precautions / Restrictions Precautions Precautions: Sternal    Mobility  Bed Mobility               General bed mobility comments: in chair on arrival  Transfers     Transfers: Sit to/from Stand Sit to Stand: Modified independent (Device/Increase time)            Ambulation/Gait Ambulation/Gait assistance: Supervision Ambulation Distance (Feet): 200 Feet Assistive device: None Gait Pattern/deviations: Step-through pattern;Trunk flexed;Decreased stride length     General Gait Details: fatigues easily and c/o cramping in her back with flexed posture, releived by leaning back on wall   Stairs            Wheelchair Mobility    Modified Rankin (Stroke Patients Only)       Balance             Standing balance-Leahy Scale: Good                      Cognition Arousal/Alertness: Awake/alert Behavior During Therapy: WFL for tasks assessed/performed Overall Cognitive Status: Within Functional Limits for tasks assessed                      Exercises      General Comments        Pertinent Vitals/Pain Pain Score: 4  Pain Location: feet Pain  Intervention(s): Monitored during session    Home Living                      Prior Function            PT Goals (current goals can now be found in the care plan section) Progress towards PT goals: Progressing toward goals    Frequency  Min 3X/week    PT Plan Current plan remains appropriate    Co-evaluation             End of Session   Activity Tolerance: Patient limited by fatigue Patient left: in chair;with call bell/phone within reach     Time: 1137-1150 PT Time Calculation (min) (ACUTE ONLY): 13 min  Charges:  $Gait Training: 8-22 mins                    G Codes:      WYNN,CYNDI 2014-08-26, 12:41 PM Magda Kiel, [place] 2014/08/26

## 2014-08-01 LAB — BODY FLUID CULTURE: CULTURE: NO GROWTH

## 2014-08-03 LAB — OTHER BODY FLUID CHEMISTRY

## 2014-08-04 ENCOUNTER — Inpatient Hospital Stay (HOSPITAL_COMMUNITY)
Admission: EM | Admit: 2014-08-04 | Discharge: 2014-08-13 | DRG: 187 | Disposition: A | Discharge status: Home Health | Payer: Medicare Other | Attending: Internal Medicine | Admitting: Internal Medicine

## 2014-08-04 ENCOUNTER — Encounter (HOSPITAL_COMMUNITY): Payer: Self-pay | Admitting: *Deleted

## 2014-08-04 ENCOUNTER — Inpatient Hospital Stay (HOSPITAL_COMMUNITY): Payer: Medicare Other

## 2014-08-04 ENCOUNTER — Other Ambulatory Visit: Payer: Self-pay | Admitting: Cardiothoracic Surgery

## 2014-08-04 DIAGNOSIS — N289 Disorder of kidney and ureter, unspecified: Secondary | ICD-10-CM

## 2014-08-04 DIAGNOSIS — T380X5A Adverse effect of glucocorticoids and synthetic analogues, initial encounter: Secondary | ICD-10-CM | POA: Diagnosis not present

## 2014-08-04 DIAGNOSIS — J90 Pleural effusion, not elsewhere classified: Principal | ICD-10-CM | POA: Diagnosis present

## 2014-08-04 DIAGNOSIS — G4700 Insomnia, unspecified: Secondary | ICD-10-CM | POA: Diagnosis present

## 2014-08-04 DIAGNOSIS — Y838 Other surgical procedures as the cause of abnormal reaction of the patient, or of later complication, without mention of misadventure at the time of the procedure: Secondary | ICD-10-CM | POA: Diagnosis present

## 2014-08-04 DIAGNOSIS — I129 Hypertensive chronic kidney disease with stage 1 through stage 4 chronic kidney disease, or unspecified chronic kidney disease: Secondary | ICD-10-CM | POA: Diagnosis present

## 2014-08-04 DIAGNOSIS — Z6830 Body mass index (BMI) 30.0-30.9, adult: Secondary | ICD-10-CM | POA: Diagnosis not present

## 2014-08-04 DIAGNOSIS — B9689 Other specified bacterial agents as the cause of diseases classified elsewhere: Secondary | ICD-10-CM | POA: Diagnosis present

## 2014-08-04 DIAGNOSIS — Z951 Presence of aortocoronary bypass graft: Secondary | ICD-10-CM | POA: Diagnosis not present

## 2014-08-04 DIAGNOSIS — I5032 Chronic diastolic (congestive) heart failure: Secondary | ICD-10-CM | POA: Diagnosis present

## 2014-08-04 DIAGNOSIS — Z7982 Long term (current) use of aspirin: Secondary | ICD-10-CM | POA: Diagnosis not present

## 2014-08-04 DIAGNOSIS — J018 Other acute sinusitis: Secondary | ICD-10-CM | POA: Diagnosis not present

## 2014-08-04 DIAGNOSIS — Z794 Long term (current) use of insulin: Secondary | ICD-10-CM

## 2014-08-04 DIAGNOSIS — J310 Chronic rhinitis: Secondary | ICD-10-CM | POA: Diagnosis present

## 2014-08-04 DIAGNOSIS — E669 Obesity, unspecified: Secondary | ICD-10-CM | POA: Diagnosis present

## 2014-08-04 DIAGNOSIS — N183 Chronic kidney disease, stage 3 (moderate): Secondary | ICD-10-CM | POA: Diagnosis present

## 2014-08-04 DIAGNOSIS — J019 Acute sinusitis, unspecified: Secondary | ICD-10-CM

## 2014-08-04 DIAGNOSIS — M797 Fibromyalgia: Secondary | ICD-10-CM

## 2014-08-04 DIAGNOSIS — D509 Iron deficiency anemia, unspecified: Secondary | ICD-10-CM | POA: Diagnosis present

## 2014-08-04 DIAGNOSIS — N185 Chronic kidney disease, stage 5: Secondary | ICD-10-CM | POA: Diagnosis present

## 2014-08-04 DIAGNOSIS — J309 Allergic rhinitis, unspecified: Secondary | ICD-10-CM

## 2014-08-04 DIAGNOSIS — E119 Type 2 diabetes mellitus without complications: Secondary | ICD-10-CM | POA: Diagnosis present

## 2014-08-04 DIAGNOSIS — I2510 Atherosclerotic heart disease of native coronary artery without angina pectoris: Secondary | ICD-10-CM

## 2014-08-04 DIAGNOSIS — I509 Heart failure, unspecified: Secondary | ICD-10-CM

## 2014-08-04 DIAGNOSIS — K5900 Constipation, unspecified: Secondary | ICD-10-CM | POA: Diagnosis present

## 2014-08-04 DIAGNOSIS — R0602 Shortness of breath: Secondary | ICD-10-CM

## 2014-08-04 DIAGNOSIS — R609 Edema, unspecified: Secondary | ICD-10-CM

## 2014-08-04 DIAGNOSIS — E1169 Type 2 diabetes mellitus with other specified complication: Secondary | ICD-10-CM

## 2014-08-04 DIAGNOSIS — I10 Essential (primary) hypertension: Secondary | ICD-10-CM | POA: Diagnosis present

## 2014-08-04 HISTORY — DX: Low back pain, unspecified: M54.50

## 2014-08-04 HISTORY — DX: Other chronic pain: G89.29

## 2014-08-04 HISTORY — DX: Fibromyalgia: M79.7

## 2014-08-04 HISTORY — DX: Unspecified osteoarthritis, unspecified site: M19.90

## 2014-08-04 HISTORY — DX: Chronic kidney disease, stage 3 (moderate): N18.3

## 2014-08-04 HISTORY — DX: Low back pain: M54.5

## 2014-08-04 HISTORY — DX: Chronic kidney disease, stage 3 unspecified: N18.30

## 2014-08-04 HISTORY — DX: Type 2 diabetes mellitus without complications: E11.9

## 2014-08-04 HISTORY — DX: Anemia, unspecified: D64.9

## 2014-08-04 HISTORY — DX: Pleural effusion, not elsewhere classified: J90

## 2014-08-04 LAB — COMPREHENSIVE METABOLIC PANEL
ALT: 15 U/L (ref 0–35)
AST: 20 U/L (ref 0–37)
Albumin: 2.9 g/dL — ABNORMAL LOW (ref 3.5–5.2)
Alkaline Phosphatase: 53 U/L (ref 39–117)
Anion gap: 5 (ref 5–15)
BILIRUBIN TOTAL: 0.5 mg/dL (ref 0.3–1.2)
BUN: 22 mg/dL (ref 6–23)
CALCIUM: 8.6 mg/dL (ref 8.4–10.5)
CHLORIDE: 105 mmol/L (ref 96–112)
CO2: 27 mmol/L (ref 19–32)
Creatinine, Ser: 1.63 mg/dL — ABNORMAL HIGH (ref 0.50–1.10)
GFR calc non Af Amer: 31 mL/min — ABNORMAL LOW (ref >90)
GFR, EST AFRICAN AMERICAN: 36 mL/min — AB (ref >90)
GLUCOSE: 126 mg/dL — AB (ref 70–99)
POTASSIUM: 4.4 mmol/L (ref 3.5–5.1)
Sodium: 137 mmol/L (ref 135–145)
Total Protein: 5.3 g/dL — ABNORMAL LOW (ref 6.0–8.3)

## 2014-08-04 LAB — CBC WITH DIFFERENTIAL/PLATELET
BASOS PCT: 0 % (ref 0–1)
Basophils Absolute: 0 10*3/uL (ref 0.0–0.1)
EOS ABS: 0.1 10*3/uL (ref 0.0–0.7)
Eosinophils Relative: 3 % (ref 0–5)
HCT: 29.2 % — ABNORMAL LOW (ref 36.0–46.0)
Hemoglobin: 9.4 g/dL — ABNORMAL LOW (ref 12.0–15.0)
LYMPHS ABS: 1.3 10*3/uL (ref 0.7–4.0)
Lymphocytes Relative: 33 % (ref 12–46)
MCH: 28 pg (ref 26.0–34.0)
MCHC: 32.2 g/dL (ref 30.0–36.0)
MCV: 86.9 fL (ref 78.0–100.0)
MONO ABS: 0.3 10*3/uL (ref 0.1–1.0)
MONOS PCT: 9 % (ref 3–12)
Neutro Abs: 2.1 10*3/uL (ref 1.7–7.7)
Neutrophils Relative %: 55 % (ref 43–77)
Platelets: 158 10*3/uL (ref 150–400)
RBC: 3.36 MIL/uL — ABNORMAL LOW (ref 3.87–5.11)
RDW: 14 % (ref 11.5–15.5)
WBC: 3.8 10*3/uL — AB (ref 4.0–10.5)

## 2014-08-04 LAB — CBG MONITORING, ED: GLUCOSE-CAPILLARY: 162 mg/dL — AB (ref 70–99)

## 2014-08-04 LAB — PROTIME-INR
INR: 1.05 (ref 0.00–1.49)
Prothrombin Time: 13.8 seconds (ref 11.6–15.2)

## 2014-08-04 LAB — GLUCOSE, CAPILLARY
GLUCOSE-CAPILLARY: 119 mg/dL — AB (ref 70–99)
Glucose-Capillary: 125 mg/dL — ABNORMAL HIGH (ref 70–99)

## 2014-08-04 LAB — TROPONIN I: Troponin I: <0.03 ng/mL (ref <0.031)

## 2014-08-04 LAB — BRAIN NATRIURETIC PEPTIDE: B Natriuretic Peptide: 159.8 pg/mL — ABNORMAL HIGH (ref 0.0–100.0)

## 2014-08-04 MED ORDER — TRAMADOL HCL 50 MG PO TABS
50.0000 mg | ORAL_TABLET | Freq: Four times a day (QID) | ORAL | Status: DC | PRN
  Administered 2014-08-04 – 2014-08-08 (×8): 50 mg via ORAL
  Filled 2014-08-04 (×9): qty 1

## 2014-08-04 MED ORDER — POLYETHYLENE GLYCOL 3350 17 G PO PACK
17.0000 g | PACK | Freq: Every day | ORAL | Status: DC
  Administered 2014-08-05 – 2014-08-13 (×7): 17 g via ORAL
  Filled 2014-08-04 (×10): qty 1

## 2014-08-04 MED ORDER — IBUPROFEN 400 MG PO TABS
600.0000 mg | ORAL_TABLET | Freq: Three times a day (TID) | ORAL | Status: DC
  Administered 2014-08-04: 600 mg via ORAL
  Filled 2014-08-04 (×2): qty 1

## 2014-08-04 MED ORDER — ACETAMINOPHEN 325 MG PO TABS
650.0000 mg | ORAL_TABLET | Freq: Four times a day (QID) | ORAL | Status: DC | PRN
  Administered 2014-08-09: 650 mg via ORAL
  Filled 2014-08-04: qty 2

## 2014-08-04 MED ORDER — ASPIRIN EC 325 MG PO TBEC
325.0000 mg | DELAYED_RELEASE_TABLET | Freq: Every day | ORAL | Status: DC
  Administered 2014-08-04 – 2014-08-13 (×9): 325 mg via ORAL
  Filled 2014-08-04 (×10): qty 1

## 2014-08-04 MED ORDER — FUROSEMIDE 40 MG PO TABS
40.0000 mg | ORAL_TABLET | Freq: Every day | ORAL | Status: DC
  Administered 2014-08-05 – 2014-08-13 (×8): 40 mg via ORAL
  Filled 2014-08-04 (×9): qty 1

## 2014-08-04 MED ORDER — TRAZODONE HCL 50 MG PO TABS
50.0000 mg | ORAL_TABLET | Freq: Every day | ORAL | Status: DC
  Administered 2014-08-05 – 2014-08-12 (×8): 50 mg via ORAL
  Filled 2014-08-04 (×12): qty 1

## 2014-08-04 MED ORDER — SODIUM CHLORIDE 0.9 % IJ SOLN
3.0000 mL | Freq: Two times a day (BID) | INTRAMUSCULAR | Status: DC
  Administered 2014-08-04 – 2014-08-11 (×8): 3 mL via INTRAVENOUS

## 2014-08-04 MED ORDER — SALINE SPRAY 0.65 % NA SOLN
1.0000 | Freq: Three times a day (TID) | NASAL | Status: DC
  Administered 2014-08-04 – 2014-08-13 (×33): 1 via NASAL
  Filled 2014-08-04 (×2): qty 44

## 2014-08-04 MED ORDER — SODIUM CHLORIDE 0.9 % IJ SOLN: 3.0000 mL | INTRAMUSCULAR | Status: DC | PRN

## 2014-08-04 MED ORDER — PRIMIDONE 50 MG PO TABS
50.0000 mg | ORAL_TABLET | Freq: Every day | ORAL | Status: DC
  Administered 2014-08-04 – 2014-08-13 (×9): 50 mg via ORAL
  Filled 2014-08-04 (×12): qty 1

## 2014-08-04 MED ORDER — FLUTICASONE PROPIONATE 50 MCG/ACT NA SUSP
2.0000 | Freq: Every day | NASAL | Status: DC
  Administered 2014-08-04 – 2014-08-13 (×9): 2 via NASAL
  Filled 2014-08-04 (×2): qty 16

## 2014-08-04 MED ORDER — HYDRALAZINE HCL 50 MG PO TABS
75.0000 mg | ORAL_TABLET | Freq: Three times a day (TID) | ORAL | Status: DC
  Administered 2014-08-04 – 2014-08-06 (×7): 75 mg via ORAL
  Administered 2014-08-06: 23:00:00 via ORAL
  Administered 2014-08-06 – 2014-08-13 (×19): 75 mg via ORAL
  Filled 2014-08-04 (×35): qty 1

## 2014-08-04 MED ORDER — FUROSEMIDE 10 MG/ML IJ SOLN
20.0000 mg | Freq: Once | INTRAMUSCULAR | Status: AC
  Administered 2014-08-04: 20 mg via INTRAVENOUS
  Filled 2014-08-04: qty 2

## 2014-08-04 MED ORDER — SODIUM CHLORIDE 0.9 % IJ SOLN
3.0000 mL | Freq: Two times a day (BID) | INTRAMUSCULAR | Status: DC
  Administered 2014-08-05 – 2014-08-13 (×4): 3 mL via INTRAVENOUS

## 2014-08-04 MED ORDER — SODIUM CHLORIDE 0.9 % IV SOLN
250.0000 mL | INTRAVENOUS | Status: DC | PRN
  Administered 2014-08-12: 08:00:00 via INTRAVENOUS

## 2014-08-04 MED ORDER — AMLODIPINE BESYLATE 10 MG PO TABS
10.0000 mg | ORAL_TABLET | Freq: Every day | ORAL | Status: DC
  Administered 2014-08-04 – 2014-08-13 (×10): 10 mg via ORAL
  Filled 2014-08-04 (×9): qty 1
  Filled 2014-08-04: qty 2

## 2014-08-04 MED ORDER — ONDANSETRON HCL 4 MG/2ML IJ SOLN
4.0000 mg | Freq: Four times a day (QID) | INTRAMUSCULAR | Status: DC | PRN
  Filled 2014-08-04: qty 2

## 2014-08-04 MED ORDER — HEPARIN SODIUM (PORCINE) 5000 UNIT/ML IJ SOLN
5000.0000 [IU] | Freq: Three times a day (TID) | INTRAMUSCULAR | Status: DC
  Administered 2014-08-04 – 2014-08-05 (×5): 5000 [IU] via SUBCUTANEOUS
  Filled 2014-08-04 (×7): qty 1

## 2014-08-04 MED ORDER — INSULIN ASPART 100 UNIT/ML ~~LOC~~ SOLN
0.0000 [IU] | Freq: Three times a day (TID) | SUBCUTANEOUS | Status: DC
  Administered 2014-08-04: 1 [IU] via SUBCUTANEOUS
  Administered 2014-08-04 – 2014-08-07 (×3): 2 [IU] via SUBCUTANEOUS
  Administered 2014-08-08 – 2014-08-11 (×10): 1 [IU] via SUBCUTANEOUS
  Administered 2014-08-11: 2 [IU] via SUBCUTANEOUS
  Administered 2014-08-12 (×2): 1 [IU] via SUBCUTANEOUS
  Administered 2014-08-13: 2 [IU] via SUBCUTANEOUS
  Administered 2014-08-13: 1 [IU] via SUBCUTANEOUS
  Filled 2014-08-04 (×2): qty 1

## 2014-08-04 MED ORDER — FE FUMARATE-B12-VIT C-FA-IFC PO CAPS
1.0000 | ORAL_CAPSULE | Freq: Every day | ORAL | Status: DC
  Administered 2014-08-04 – 2014-08-13 (×9): 1 via ORAL
  Filled 2014-08-04 (×13): qty 1

## 2014-08-04 MED ORDER — ATORVASTATIN CALCIUM 40 MG PO TABS
40.0000 mg | ORAL_TABLET | Freq: Every day | ORAL | Status: DC
  Administered 2014-08-04 – 2014-08-12 (×8): 40 mg via ORAL
  Filled 2014-08-04 (×11): qty 1

## 2014-08-04 MED ORDER — ONDANSETRON HCL 4 MG PO TABS: 4.0000 mg | ORAL_TABLET | Freq: Four times a day (QID) | ORAL | Status: DC | PRN

## 2014-08-04 NOTE — Progress Notes (Signed)
Received patient from ED.

## 2014-08-04 NOTE — ED Provider Notes (Signed)
CSN: [id number]     Arrival date & time 08/04/14  0003 History   First MD Initiated Contact with Patient 08/04/14 0045     This chart was scribed for Delora Fuel, MD by Forrestine Him, ED Scribe. This patient was seen in room D34C/D34C and the patient's care was started 12:46 AM.   Chief Complaint  Patient presents with  . Shortness of Breath    Transfer from [facility]; Bilateral plueral effusion   The history is provided by the patient. No language interpreter was used.    HPI Comments: Joyce [name] is a 74 y.o. female with a PMHx of DM, HTN, and chronic kidney disease who presents to the Emergency Department complaining of constant, moderate shortness of breath x 1 day. Symptoms are worsened when in supine position. Pt was transferred from [facility] for bilateral pleural effusion. Joyce [name] was admitted last week for same and states 3.5 liters of fluid removed. No recent fever, chills, or chest pain. PSHx includes coronary artery bypass graft June 15, 2014. Pt with known allergies to Carvedilol, Codeine, and Metoprolol.  She is followed by Dr. Sherren Mocha- Cardiolgoist and Dr. Nils Pyle- Cardiothoracic surgery PCP: Dr. Bailey Mech and Oda Kilts in [place]  Past Medical History  Diagnosis Date  . Chronic kidney disease   . Diabetes mellitus without complication   . Hypertension   . Anginal pain   . Acute on chronic diastolic heart failure 123456  . Pleural effusion 07/26/2014    Post-operative - bilateral    Past Surgical History  Procedure Laterality Date  . Cholecystectomy    . Abdominal hysterectomy    . Tonsillectomy    . Left heart catheterization with coronary angiogram N/A 06/11/2014    Procedure: LEFT HEART CATHETERIZATION WITH CORONARY ANGIOGRAM;  Surgeon: Blane Ohara, MD;  Location: [facility] CATH LAB;  Service: Cardiovascular;  Laterality: N/A;  . Coronary artery bypass graft N/A 06/15/2014    Procedure: CORONARY ARTERY BYPASS GRAFTING (CABG);  Surgeon:  Ivin Poot, MD;  Location: [place];  Service: Open Heart Surgery;  Laterality: N/A;  . Intraoperative transesophageal echocardiogram N/A 06/15/2014    Procedure: INTRAOPERATIVE TRANSESOPHAGEAL ECHOCARDIOGRAM;  Surgeon: Ivin Poot, MD;  Location: [place];  Service: Open Heart Surgery;  Laterality: N/A;   History reviewed. No pertinent family history. History  Substance Use Topics  . Smoking status: Never Smoker   . Smokeless tobacco: Not on file  . Alcohol Use: Not on file   OB History    No data available     Review of Systems  Constitutional: Negative for fever and chills.  Respiratory: Positive for cough and shortness of breath.   Cardiovascular: Negative for chest pain.  All other systems reviewed and are negative.     Allergies  Carvedilol; Codeine; and Metoprolol  Home Medications   Prior to Admission medications   Medication Sig Start Date End Date Taking? Authorizing Provider  acetaminophen (TYLENOL) 325 MG tablet Take 650 mg by mouth every 6 (six) hours as needed for moderate pain (pain/fever).     Historical Provider, MD  amLODipine (NORVASC) 10 MG tablet Take 10 mg by mouth daily.  05/28/14   Historical Provider, MD  aspirin EC 325 MG EC tablet Take 1 tablet (325 mg total) by mouth daily. 06/22/14   Donielle Liston Alba, PA-C  atorvastatin (LIPITOR) 40 MG tablet Take 1 tablet (40 mg total) by mouth daily at 6 PM. 06/22/14   Donielle Liston Alba, PA-C  ferrous  [id number] C-folic acid (TRINSICON / FOLTRIN) capsule Take 1 capsule by mouth daily with breakfast. For one month then stop. Patient taking differently: Take 1 capsule by mouth daily with breakfast.  06/22/14   Donielle Liston Alba, PA-C  GLUCERNA (GLUCERNA) LIQD Take 237 mLs by mouth daily.    Historical Provider, MD  hydrALAZINE (APRESOLINE) 25 MG tablet Take 3 tablets (75 mg total) by mouth every 8 (eight) hours. Patient taking differently: Take 75 mg by mouth 3 (three) times daily. 9am, 2pm,  9pm 06/22/14   Donielle Liston Alba, PA-C  ibuprofen (ADVIL,MOTRIN) 600 MG tablet Take 1 tablet (600 mg total) by mouth every 8 (eight) hours. 07/31/14   Juluis Mire, MD  insulin aspart (NOVOLOG) 100 UNIT/ML injection Inject 0-24 Units into the skin 4 (four) times daily -  before meals and at bedtime. Units of Insulin If CBG,120=0     251-300=12 121-160=2         301-350=16 161-200=4         351-450=20 201-250=8         >450=24 and call MD Patient not taking: Reported on 07/26/2014 06/22/14   Donielle Liston Alba, PA-C  insulin glargine (LANTUS) 100 UNIT/ML injection Inject 10 Units into the skin at bedtime.    Historical Provider, MD  insulin lispro (HUMALOG) 100 UNIT/ML KiwkPen Inject 8 Units into the skin 3 (three) times daily with meals.    Historical Provider, MD  Menthol-Zinc Oxide 0.44-20.625 % OINT Apply 1 application topically 3 (three) times daily. Apply to buttocks and groin topically every shift for rash    Historical Provider, MD  metFORMIN (GLUCOPHAGE) 500 MG tablet Take 500 mg by mouth 2 (two) times daily with a meal. 9am, 6pm    Historical Provider, MD  primidone (MYSOLINE) 50 MG tablet Take 50 mg by mouth daily.  05/28/14   Historical Provider, MD  traMADol (ULTRAM) 50 MG tablet Take 1 tablet (50 mg total) by mouth every 6 (six) hours as needed for moderate pain. Patient taking differently: Take 50 mg by mouth every 4 (four) hours as needed for moderate pain or severe pain.  06/22/14   Donielle Liston Alba, PA-C  traZODone (DESYREL) 100 MG tablet Take 100 mg by mouth at bedtime.  04/29/14   Historical Provider, MD   Triage Vitals: BP 157/63 mmHg  Pulse 85  Temp(Src) 98.1 F (36.7 C) (Oral)  Resp 20  Ht 5\' 5"  (1.651 m)  Wt 190 lb (86.183 kg)  BMI 31.62 kg/m2  SpO2 100%   Physical Exam  Constitutional: She is oriented to person, place, and time. She appears well-developed and well-nourished. No distress.  HENT:  Head: Normocephalic and atraumatic.  Eyes: EOM are normal.  Pupils are equal, round, and reactive to light.  Neck: Normal range of motion. Neck supple. No JVD present.  Cardiovascular: Normal rate, regular rhythm and normal heart sounds.   No murmur heard. Pulmonary/Chest: Effort normal. No respiratory distress. She has no wheezes. She has no rales.  Scar healing well Decreased breath sounds Dullness to percussion to both bases  Abdominal: Soft. She exhibits no distension and no mass. There is no tenderness.  Musculoskeletal: Normal range of motion.  3 plus pedal edema bilaterally  Lymphadenopathy:    She has no cervical adenopathy.  Neurological: She is alert and oriented to person, place, and time. No cranial nerve deficit. Coordination normal.  Skin: Skin is warm and dry. No rash noted.  Psychiatric: She has a normal mood and affect. Her  behavior is normal. Judgment and thought content normal.  Nursing note and vitals reviewed.   ED Course  Procedures (including critical care time)  DIAGNOSTIC STUDIES: Oxygen Saturation is 100% on RA, Normal by my interpretation.    COORDINATION OF CARE: 1:07 AM- Will order blood work. Discussed treatment plan with pt at bedside and pt agreed to plan.     Labs Review Results for orders placed or performed during the hospital encounter of 08/04/14  Comprehensive metabolic panel  Result Value Ref Range   Sodium 137 135 - 145 mmol/L   Potassium 4.4 3.5 - 5.1 mmol/L   Chloride 105 96 - 112 mmol/L   CO2 27 19 - 32 mmol/L   Glucose, Bld 126 (H) 70 - 99 mg/dL   BUN 22 6 - 23 mg/dL   Creatinine, Ser 1.63 (H) 0.50 - 1.10 mg/dL   Calcium 8.6 8.4 - 10.5 mg/dL   Total Protein 5.3 (L) 6.0 - 8.3 g/dL   Albumin 2.9 (L) 3.5 - 5.2 g/dL   AST 20 0 - 37 U/L   ALT 15 0 - 35 U/L   Alkaline Phosphatase 53 39 - 117 U/L   Total Bilirubin 0.5 0.3 - 1.2 mg/dL   GFR calc non Af Amer 31 (L) >90 mL/min   GFR calc Af Amer 36 (L) >90 mL/min   Anion gap 5 5 - 15  CBC with Differential  Result Value Ref Range   WBC 3.8  (L) 4.0 - 10.5 K/uL   RBC 3.36 (L) 3.87 - 5.11 MIL/uL   Hemoglobin 9.4 (L) 12.0 - 15.0 g/dL   HCT 29.2 (L) 36.0 - 46.0 %   MCV 86.9 78.0 - 100.0 fL   MCH 28.0 26.0 - 34.0 pg   MCHC 32.2 30.0 - 36.0 g/dL   RDW 14.0 11.5 - 15.5 %   Platelets 158 150 - 400 K/uL   Neutrophils Relative % 55 43 - 77 %   Neutro Abs 2.1 1.7 - 7.7 K/uL   Lymphocytes Relative 33 12 - 46 %   Lymphs Abs 1.3 0.7 - 4.0 K/uL   Monocytes Relative 9 3 - 12 %   Monocytes Absolute 0.3 0.1 - 1.0 K/uL   Eosinophils Relative 3 0 - 5 %   Eosinophils Absolute 0.1 0.0 - 0.7 K/uL   Basophils Relative 0 0 - 1 %   Basophils Absolute 0.0 0.0 - 0.1 K/uL  Troponin I  Result Value Ref Range   Troponin I <0.03 <0.031 ng/mL  Brain natriuretic peptide  Result Value Ref Range   B Natriuretic Peptide 159.8 (H) 0.0 - 100.0 pg/mL     Date: 08/04/2014 (obtained at [facility])  Rate: 85  Rhythm: normal sinus rhythm  QRS Axis: normal  Intervals: normal  ST/T Wave abnormalities: nonspecific T wave changes  Conduction Disutrbances:none  Narrative Interpretation: Low voltage, nonspecific T-wave flattening. When compared with ECG of 07/26/2014, QRS voltage has decreased significantly.  Old EKG Reviewed: changes noted    MDM   Final diagnoses:  Bilateral pleural effusion  Renal insufficiency  Normocytic hypochromic anemia    Recurrent pleural effusions post coronary artery bypass surgery. Old records are reviewed and she had been discharged 5 days ago following an admission for bilateral pleural effusions. She will need to be admitted for consideration for either pleural drain or pleurodesis.  Workup shows stable renal insufficiency and normochromic, normocytic anemia. She has not had any respiratory distress in the ED. Case is discussed with internal medicine  resident who agrees to admit the patient. Consultation will be obtained with cardiothoracic surgery.  I personally performed the services described in this  documentation, which was scribed in my presence. The recorded information has been reviewed and is accurate.     Delora Fuel, MD [id number]

## 2014-08-04 NOTE — ED Notes (Signed)
Pt was admitted last week for same complaint. Pt states 3.5L removes last.

## 2014-08-04 NOTE — ED Notes (Signed)
Pt arrives from [facility]. Pt c/o SOB since yesterday that has gotten progressively worse today. Per [facility] took X-ray and pt has bilateral pleural effusions.

## 2014-08-04 NOTE — H&P (Signed)
Date: 08/04/2014               Patient Name:  Joyce [name] MRN: [id number]  DOB: [birth year] Age / Sex: 74 y.o., female   PCP: Allie Dimmer, MD         Medical Service: Internal Medicine Teaching Service         Attending Physician: Dr. Bartholomew Crews, MD    First Contact: Dr. Trudee Kuster Pager: [phone]  Second Contact: Dr. Naaman Plummer Pager: [phone]       After Hours (After 5p/  First Contact Pager: [phone]  weekends / holidays): Second Contact Pager: [phone]   Chief Complaint: constant SOB  History of Present Illness:   74 yo female with DM II, HTN, CKD III presents, CAD s/p CABG 05/2014, recent pleural effusion thought to be post-thoracotomy pleural effusion s/p thoracentesis, discharged on 07/31/14 comes here from home with worsening SOB. Presented to [place] [facility], CXR showing increased pleural effusion. SOB has improved with O2 per patient. She is not able to lie down and not feeling comfortable because of the SOB. Patient also has b/l leg edema which started after discharge. She was discharged without lasix as her kidney function worsened with lasix. Denies any chest pain, fever, chills. Has dry cough with some runny nose. Denies diarrhea, n/v/ or any other complaint.   Meds: Current Facility-Administered Medications  Medication Dose Route Frequency Provider Last Rate Last Dose  . furosemide (LASIX) injection 20 mg  20 mg Intravenous Once Dellia Nims, MD       Current Outpatient Prescriptions  Medication Sig Dispense Refill  . acetaminophen (TYLENOL) 325 MG tablet Take 650 mg by mouth every 6 (six) hours as needed for moderate pain (pain/fever).     Marland Kitchen amLODipine (NORVASC) 10 MG tablet Take 10 mg by mouth daily.   5  . aspirin EC 325 MG EC tablet Take 1 tablet (325 mg total) by mouth daily. 30 tablet 0  . hydrALAZINE (APRESOLINE) 25 MG tablet Take 3 tablets (75 mg total) by mouth every 8 (eight) hours. (Patient taking differently: Take 75 mg by mouth daily. )    .  ibuprofen (ADVIL,MOTRIN) 600 MG tablet Take 1 tablet (600 mg total) by mouth every 8 (eight) hours. 36 tablet 0  . insulin glargine (LANTUS) 100 UNIT/ML injection Inject 10 Units into the skin at bedtime.    . insulin lispro (HUMALOG) 100 UNIT/ML KiwkPen Inject 0-12 Units into the skin 3 (three) times daily with meals.     . Menthol-Zinc Oxide 0.44-20.625 % OINT Apply 1 application topically 3 (three) times daily. Apply to buttocks and groin topically every shift for rash    . metFORMIN (GLUCOPHAGE) 500 MG tablet Take 500 mg by mouth 2 (two) times daily with a meal. 9am, 6pm    . primidone (MYSOLINE) 50 MG tablet Take 50 mg by mouth daily.   5  . traMADol (ULTRAM) 50 MG tablet Take 1 tablet (50 mg total) by mouth every 6 (six) hours as needed for moderate pain. (Patient taking differently: Take 50 mg by mouth every 4 (four) hours as needed for moderate pain or severe pain. ) 30 tablet 0  . traZODone (DESYREL) 100 MG tablet Take 100 mg by mouth at bedtime.   0  . atorvastatin (LIPITOR) 40 MG tablet Take 1 tablet (40 mg total) by mouth daily at 6 PM. (Patient not taking: Reported on 08/04/2014)    . ferrous Q000111Q C-folic acid (TRINSICON / FOLTRIN) capsule Take  1 capsule by mouth daily with breakfast. For one month then stop. (Patient not taking: Reported on 08/04/2014)    . GLUCERNA (GLUCERNA) LIQD Take 237 mLs by mouth daily.    . insulin aspart (NOVOLOG) 100 UNIT/ML injection Inject 0-24 Units into the skin 4 (four) times daily -  before meals and at bedtime. Units of Insulin If CBG,120=0     251-300=12 121-160=2         301-350=16 161-200=4         351-450=20 201-250=8         >450=24 and call MD (Patient not taking: Reported on 08/04/2014) 10 mL 11    Allergies: Allergies as of 08/04/2014 - Review Complete 08/04/2014  Allergen Reaction Noted  . Carvedilol Other (See Comments) 06/11/2014  . Codeine Other (See Comments) 06/11/2014  . Metoprolol Palpitations 06/11/2014   Past Medical  History  Diagnosis Date  . Chronic kidney disease   . Diabetes mellitus without complication   . Hypertension   . Anginal pain   . Acute on chronic diastolic heart failure 123456  . Pleural effusion 07/26/2014    Post-operative - bilateral    Past Surgical History  Procedure Laterality Date  . Cholecystectomy    . Abdominal hysterectomy    . Tonsillectomy    . Left heart catheterization with coronary angiogram N/A 06/11/2014    Procedure: LEFT HEART CATHETERIZATION WITH CORONARY ANGIOGRAM;  Surgeon: Blane Ohara, MD;  Location[facility] CATH LAB;  Service: Cardiovascular;  Laterality: N/A;  . Coronary artery bypass graft N/A 06/15/2014    Procedure: CORONARY ARTERY BYPASS GRAFTING (CABG);  Surgeon: Ivin Poot, MD;  Location: [place];  Service: Open Heart Surgery;  Laterality: N/A;  . Intraoperative transesophageal echocardiogram N/A 06/15/2014    Procedure: INTRAOPERATIVE TRANSESOPHAGEAL ECHOCARDIOGRAM;  Surgeon: Ivin Poot, MD;  Location: [place];  Service: Open Heart Surgery;  Laterality: N/A;   History reviewed. No pertinent family history. History   Social History  . Marital Status: Married    Spouse Name: N/A    Number of Children: N/A  . Years of Education: N/A   Occupational History  . Not on file.   Social History Main Topics  . Smoking status: Never Smoker   . Smokeless tobacco: Not on file  . Alcohol Use: Not on file  . Drug Use: Not on file  . Sexual Activity: Not on file   Other Topics Concern  . Not on file   Social History Narrative    Review of Systems: Review of Systems  Constitutional: Negative for fever, chills, weight loss, malaise/fatigue and diaphoresis.  HENT: Positive for congestion. Negative for ear pain, hearing loss, sore throat and tinnitus.   Eyes: Negative.   Respiratory: Positive for cough and shortness of breath. Negative for sputum production and wheezing.   Cardiovascular: Negative for chest pain, palpitations, orthopnea,  claudication and leg swelling.  Gastrointestinal: Negative for nausea, vomiting, abdominal pain, diarrhea, blood in stool and melena.  Genitourinary: Negative.   Musculoskeletal: Negative for myalgias, back pain, falls and neck pain.  Skin: Negative.   Neurological: Negative.  Negative for headaches.  Endo/Heme/Allergies: Negative.   Psychiatric/Behavioral: Negative.     Physical Exam: Blood pressure 149/47, pulse 84, temperature 98.1 F (36.7 C), temperature source Oral, resp. rate 22, height 5\' 5"  (1.651 m), weight 86.183 kg (190 lb), SpO2 100 %. Physical Exam  Constitutional: She is oriented to person, place, and time. She appears well-developed and well-nourished. No distress.  Sitting  up in bed. Not able to get comfortable.   HENT:  Head: Normocephalic.  Eyes: Conjunctivae and EOM are normal. Pupils are equal, round, and reactive to light. Right eye exhibits no discharge. Left eye exhibits no discharge. No scleral icterus.  Neck: Normal range of motion. Neck supple.  Cardiovascular: Normal rate, regular rhythm and normal heart sounds.  Exam reveals no gallop and no friction rub.   No murmur heard. Respiratory: Effort normal and breath sounds normal. No respiratory distress. She exhibits no tenderness.  No accessory muscle use. Breathing comfortably. No wheezing. Decreased breathe sounds on b/l bases. No crackles.   GI: Soft. Bowel sounds are normal. She exhibits no distension and no mass. There is no tenderness. There is no rebound and no guarding.  Musculoskeletal: Normal range of motion. She exhibits edema. She exhibits no tenderness.  2+ pitting edema on both lower extremities upto the knees.   Neurological: She is alert and oriented to person, place, and time. She has normal reflexes. No cranial nerve deficit. Coordination normal.  Skin: She is not diaphoretic.     Lab results: Basic Metabolic Panel:  Recent Labs  08/04/14 0112  NA 137  K 4.4  CL 105  CO2 27    GLUCOSE 126*  BUN 22  CREATININE 1.63*  CALCIUM 8.6   Liver Function Tests:  Recent Labs  08/04/14 0112  AST 20  ALT 15  ALKPHOS 53  BILITOT 0.5  PROT 5.3*  ALBUMIN 2.9*   No results for input(s): LIPASE, AMYLASE in the last 72 hours. No results for input(s): AMMONIA in the last 72 hours. CBC:  Recent Labs  08/04/14 0112  WBC 3.8*  NEUTROABS 2.1  HGB 9.4*  HCT 29.2*  MCV 86.9  PLT 158   Cardiac Enzymes:  Recent Labs  08/04/14 0112  TROPONINI <0.03   BNP: No results for input(s): PROBNP in the last 72 hours. D-Dimer: No results for input(s): DDIMER in the last 72 hours. CBG: No results for input(s): GLUCAP in the last 72 hours. Hemoglobin A1C: No results for input(s): HGBA1C in the last 72 hours. Fasting Lipid Panel: No results for input(s): CHOL, HDL, LDLCALC, TRIG, CHOLHDL, LDLDIRECT in the last 72 hours. Thyroid Function Tests: No results for input(s): TSH, T4TOTAL, FREET4, T3FREE, THYROIDAB in the last 72 hours. Anemia Panel: No results for input(s): VITAMINB12, FOLATE, FERRITIN, TIBC, IRON, RETICCTPCT in the last 72 hours. Coagulation: No results for input(s): LABPROT, INR in the last 72 hours. Urine Drug Screen: Drugs of Abuse  No results found for: LABOPIA, COCAINSCRNUR, LABBENZ, AMPHETMU, THCU, LABBARB  Alcohol Level: No results for input(s): ETH in the last 72 hours. Urinalysis: No results for input(s): COLORURINE, LABSPEC, PHURINE, GLUCOSEU, HGBUR, BILIRUBINUR, KETONESUR, PROTEINUR, UROBILINOGEN, NITRITE, LEUKOCYTESUR in the last 72 hours.  Invalid input(s): APPERANCEUR Misc. Labs:  Imaging results:  No results found.  Other results: EKG: none.  Assessment & Plan by Problem: Principal Problem:   Bilateral pleural effusion Active Problems:   Chronic kidney disease (CKD), stage III (moderate)   HTN (hypertension)   Diabetes mellitus type 2 in obese   S/P CABG x 4   Diastolic CHF, chronic  74 yo female with CKD III,   Post-thoracotomy pleural effusion - with recurrent b/l pleural effusion seen on CXR and SOB.  - previous plan was Ibuprofen 600mg  every 8 hours for 14 days with resolution of symptoms. Lasix didn't help much previously and it did worsen kidney function. She had  1L drained from each  side. Fluid analaysis showed exudative effusion with no neutrophil count consistent with nonspecific post thoracotomy pleural effusion. Had 3rd thora to reduce fluid even more which helped with SOB before d/c. There was discussion about putting in a pleurx cathether. This seems reasonable at this time. - Please consult Dr. Nils Pyle for thoracentesis and concurrent pleurx cathether placement. - supplemental O2 to keep >92%, continuous pulse ox.  CHF - EF 60% EF, grade 1 diastolic on Q000111Q.  - has lower ext edema. Will give small dose lasix 20mg  IV once. Have to be careful with AKI.  CKD III - crt was up last admission, thought to be 2/2 to lasix and acei. Both were d/ced on discharge.    currently crt is stable around baseline (1.8). Now 1.63.  - hold acei. Lasix small dose to help with leg edema.   Rhinitis - likely allergic. - will treat with flonase nasal spray and saline water spray.  CAD - s/p CABG - unable to tolerate beta blocker.  - cont asa 325mg , lipitor. Lisinopril held due to concern for AKI with ibuprofen.  HTN - BP mildly elevated. Will resume home med amlodipine and hydralazine  DM II - she states taking sliding scale Lantus depending on cbg and also humalog. - will have her on SSI here. No lantus for now.   Fibromyalgia - cont primidone and tramadol 50mg  PRN  Insomnia - holding trazodone for now.   dvt ppx: heparin.  Dispo: Disposition is deferred at this time, awaiting improvement of current medical problems. Anticipated discharge in approximately 1-2 day(s).   The patient does have a current PCP Allie Dimmer, MD) and does need an [facility] hospital follow-up appointment after  discharge.  The patient does have transportation limitations that hinder transportation to clinic appointments.  Signed: Dellia Nims, MD 08/04/2014, 3:50 AM

## 2014-08-04 NOTE — ED Notes (Signed)
Pt sitting in chair at bedside - states she is unable to get comfortable in bed.

## 2014-08-04 NOTE — ED Notes (Addendum)
[facility] gave 40mg  of lasix

## 2014-08-04 NOTE — Progress Notes (Signed)
Subjective:    She reports continued shortness of breath and lower extremity swelling. She says that she is hungry and, and she is asking to eat.  Interval Events: -Given Lasix 20 mg IV in the ER. -Still awaiting an inpatient bed.   Objective:   Vital Signs:   Temp:  [98.1 F (36.7 C)-98.2 F (36.8 C)] 98.2 F (36.8 C) (02/09 1224) Pulse Rate:  [79-96] 96 (02/09 1300) Resp:  [14-28] 16 (02/09 1300) BP: (107-157)/(47-97) 157/74 mmHg (02/09 1300) SpO2:  [95 %-100 %] 98 % (02/09 1300) Weight:  [190 lb (86.183 kg)] 190 lb (86.183 kg) (02/09 0012)    24-hour weight change: Weight change:   Intake/Output:   Intake/Output Summary (Last 24 hours) at 08/04/14 1334 Last data filed at 08/04/14 1049  Gross per 24 hour  Intake    503 ml  Output      0 ml  Net    503 ml      Physical Exam: General: Vital signs reviewed and noted. Well-developed, well-nourished, in no acute distress; alert, appropriate and cooperative throughout examination.  Lungs:  Increased work of breathing, bilateral crackles with decreased breath sounds at bilateral lung bases.  Heart: RRR. S1 and S2 normal without gallop, murmur, or rubs.  Abdomen:  BS normoactive. Soft, Nondistended, non-tender.  No masses or organomegaly.  Extremities: 2+ pitting edema to knees bilaterally.      Labs:  Basic Metabolic Panel:  Recent Labs Lab 07/29/14 0336 07/30/14 0500 07/31/14 0548 08/04/14 0112  NA 134* 134* 139 137  K 4.5 4.4 4.4 4.4  CL 98 100 105 105  CO2 30 26 27 27   GLUCOSE 112* 113* 146* 126*  BUN 36* 39* 31* 22  CREATININE 2.43* 2.01* 1.79* 1.63*  CALCIUM 8.2* 8.3* 8.6 8.6    Liver Function Tests:  Recent Labs Lab 08/04/14 0112  AST 20  ALT 15  ALKPHOS 53  BILITOT 0.5  PROT 5.3*  ALBUMIN 2.9*   CBC:  Recent Labs Lab 08/04/14 0112  WBC 3.8*  NEUTROABS 2.1  HGB 9.4*  HCT 29.2*  MCV 86.9  PLT 158    CBG:  Recent Labs Lab 07/30/14 1607 07/30/14 2109 07/31/14 0555  07/31/14 1139 08/04/14 1124  GLUCAP 166* 130* 145* 129* 162*    Microbiology: Results for orders placed or performed during the hospital encounter of 07/26/14  MRSA PCR Screening     Status: None   Collection Time: 07/26/14  7:04 PM  Result Value Ref Range Status   MRSA by PCR NEGATIVE NEGATIVE Final    Comment:        The GeneXpert MRSA Assay (FDA approved for NASAL specimens only), is one component of a comprehensive MRSA colonization surveillance program. It is not intended to diagnose MRSA infection nor to guide or monitor treatment for MRSA infections.   Body fluid culture     Status: None   Collection Time: 07/28/14  1:34 PM  Result Value Ref Range Status   Specimen Description FLUID PLEURAL LEFT  Final   Special Requests NONE  Final   Gram Stain   Final    FEW WBC PRESENT, PREDOMINANTLY MONONUCLEAR NO ORGANISMS SEEN Performed at [facility]    Culture   Final    NO GROWTH 3 DAYS Performed at [facility]    Report Status 08/01/2014 FINAL  Final    Coagulation Studies:  Recent Labs  08/04/14 0430  LABPROT 13.8  INR 1.05  Imaging: Dg Chest 2 View  08/04/2014   CLINICAL DATA:  Shortness of Breath  EXAM: CHEST  2 VIEW  COMPARISON:  August 03, 2014  FINDINGS: There is patchy consolidation in both lung bases with pleural effusions bilaterally. Lungs elsewhere clear. Heart is borderline prominent with pulmonary vascularity within normal limits. Patient is status post coronary artery bypass grafting. No adenopathy. No pneumothorax. There is postoperative change in the lower cervical spine.  IMPRESSION: Patchy bibasilar consolidation with pleural effusions bilaterally. No appreciable change from 1 day prior.   Electronically Signed   By: Lowella Grip III M.D.   On: 08/04/2014 08:17       Medications:    Infusions: . sodium chloride      Scheduled Medications: . amLODipine  10 mg Oral Daily  . aspirin EC  325 mg Oral Daily  .  atorvastatin  40 mg Oral q1800  . ferrous Q000111Q C-folic acid  1 capsule Oral Q breakfast  . fluticasone  2 spray Each Nare Daily  . heparin  5,000 Units Subcutaneous 3 times per day  . hydrALAZINE  75 mg Oral 3 times per day  . insulin aspart  0-9 Units Subcutaneous TID WC  . primidone  50 mg Oral Daily  . sodium chloride  1 spray Each Nare TID AC & HS  . sodium chloride  3 mL Intravenous Q12H  . sodium chloride  3 mL Intravenous Q12H    PRN Medications: sodium chloride, acetaminophen, ondansetron **OR** ondansetron (ZOFRAN) IV, sodium chloride, traMADol   Assessment/ Plan:    Principal Problem:   Bilateral pleural effusion Active Problems:   Chronic kidney disease (CKD), stage III (moderate)   HTN (hypertension)   Diabetes mellitus type 2 in obese   S/P CABG x 4   Diastolic CHF, chronic  #Post-thoracotomy pleural effusions Recurrent pleural effusions after thoracentesis. She was failed ibuprofen treatment. The effusions were previously drained and were found to be exudative pleural effusions, so this is not consistent with congestive heart failure. In addition, she only had grade 1 diastolic dysfunction on echo. -Stop ibuprofen given lack of efficacy. -Will need Pleurx catheters for drainage. Appreciate help from cardiothoracic surgery. -Supplemental oxygen for comfort.  -Reconsult PT and OT.  #Lower extremity edema Grade 1 diastolic heart failure on echo with insignificant elevation of BNP. Could also be due to calcium channel blocker. She had been taking Lasix as an outpatient since her bypass surgery. She does have 2+ pitting edema since stopping Lasix due to AKI. Her creatinine is back to her baseline, so we will try to diurese with Lasix. Received 20 mg IV in the ER. -Oral furosemide 40 mg daily starting tomorrow.  #Acute kidney injury during previous admission Creatinine back to baseline. Likely due to overdiuresis and use of ACE inhibitor during previous  admission. -We'll diurese carefully and monitor creatinine.  #Constipation During previous admission. -MiraLAX daily for prevention.  #CAD s/p CABG -Continue aspirin 325 mg daily. -Continue atorvastatin.  #HTN -Continue amlodipine 10 mg daily, hydralazine 75 mg TID.  #DM2 -CBGs ACHS. -SSI-S. -Old home Lantus.  #Rhinitis -Continue Flonase and saline nasal spray.  #Fibromyalgia -Continue primidone and tramadol 50 mg q6h PRN.   DVT PPX - heparin  CODE STATUS - Full  CONSULTS PLACED - Cardiothoracic surgery  DISPO - referred, awaiting resolution of pleural effusions.  The patient does have a current PCP Allie Dimmer, MD) and does not need an [facility] hospital follow-up appointment after discharge.    Is the [facility]  hospital follow-up appointment a one-time only appointment? N/A.  Does the patient have transportation limitations that hinder transportation to clinic appointments? yes   SERVICE NEEDED AT [place]         Y = Yes, Blank = No PT:   OT:   RN:   Equipment:   Other:      Length of Stay: 0 day(s)   Signed: Arman Filter, MD  PGY-1, Internal Medicine Resident Pager: [phone] (7AM-5PM) 08/04/2014, 1:34 PM

## 2014-08-04 NOTE — Progress Notes (Signed)
  Subjective: recurent diastolic HF  S/p CABG with normal EF, mod LVH Post op pleural effusions Chronic renal insufficiency Current CXR reviewed Objective: Vital signs in last 24 hours: Temp:  [98.1 F (36.7 C)] 98.1 F (36.7 C) (02/09 0513) Pulse Rate:  [79-92] 87 (02/09 0735) Cardiac Rhythm:  [-] Normal sinus rhythm (02/09 0016) Resp:  [18-28] 20 (02/09 0735) BP: (107-157)/(47-88) 137/65 mmHg (02/09 0735) SpO2:  [97 %-100 %] 97 % (02/09 0735) Weight:  [190 lb (86.183 kg)] 190 lb (86.183 kg) (02/09 0012)  Hemodynamic parameters for last 24 hours:    Intake/Output from previous day:   Intake/Output this shift:    Incisions well healed NSR  Lab Results:  Recent Labs  08/04/14 0112  WBC 3.8*  HGB 9.4*  HCT 29.2*  PLT 158   BMET:  Recent Labs  08/04/14 0112  NA 137  K 4.4  CL 105  CO2 27  GLUCOSE 126*  BUN 22  CREATININE 1.63*  CALCIUM 8.6    PT/INR:  Recent Labs  08/04/14 0430  LABPROT 13.8  INR 1.05   ABG    Component Value Date/Time   PHART 7.295* 06/15/2014 2013   HCO3 19.7* 06/15/2014 2013   TCO2 18 06/16/2014 1712   ACIDBASEDEF 6.0* 06/15/2014 2013   O2SAT 97.0 06/15/2014 2013   CBG (last 3)  No results for input(s): GLUCAP in the last 72 hours.  Assessment/Plan: S/P  CABG- recurrent R pleural effusion Would not repeat thoracentesis now Rec Adv HF consult for diastolic HF If R effusion doesn't respond to lasix then R pleurx later in week DC Ibuprofen- will only worsen renal fx Will follow  LOS: 0 days    [name],Ayumi Wangerin 08/04/2014

## 2014-08-04 NOTE — ED Notes (Signed)
CBG: 124 (glucometer won't recognize pt when scanned)

## 2014-08-04 NOTE — ED Notes (Signed)
Admitting physician rounding at bedside

## 2014-08-04 NOTE — ED Notes (Signed)
Ordered heart healthy/carb modified diet for pt.

## 2014-08-05 ENCOUNTER — Ambulatory Visit: Payer: Self-pay | Admitting: Cardiothoracic Surgery

## 2014-08-05 LAB — BASIC METABOLIC PANEL
ANION GAP: 5 (ref 5–15)
BUN: 18 mg/dL (ref 6–23)
CHLORIDE: 107 mmol/L (ref 96–112)
CO2: 26 mmol/L (ref 19–32)
Calcium: 8.6 mg/dL (ref 8.4–10.5)
Creatinine, Ser: 1.42 mg/dL — ABNORMAL HIGH (ref 0.50–1.10)
GFR calc non Af Amer: 36 mL/min — ABNORMAL LOW (ref >90)
GFR, EST AFRICAN AMERICAN: 42 mL/min — AB (ref >90)
Glucose, Bld: 143 mg/dL — ABNORMAL HIGH (ref 70–99)
POTASSIUM: 4.3 mmol/L (ref 3.5–5.1)
SODIUM: 138 mmol/L (ref 135–145)

## 2014-08-05 LAB — GLUCOSE, CAPILLARY
GLUCOSE-CAPILLARY: 124 mg/dL — AB (ref 70–99)
GLUCOSE-CAPILLARY: 139 mg/dL — AB (ref 70–99)
GLUCOSE-CAPILLARY: 210 mg/dL — AB (ref 70–99)
Glucose-Capillary: 105 mg/dL — ABNORMAL HIGH (ref 70–99)
Glucose-Capillary: 109 mg/dL — ABNORMAL HIGH (ref 70–99)

## 2014-08-05 MED ORDER — SPIRONOLACTONE 25 MG PO TABS
25.0000 mg | ORAL_TABLET | Freq: Every day | ORAL | Status: DC
  Administered 2014-08-05 – 2014-08-07 (×3): 25 mg via ORAL
  Filled 2014-08-05 (×3): qty 1

## 2014-08-05 MED ORDER — DEXTROSE 5 % IV SOLN
1.5000 g | Freq: Once | INTRAVENOUS | Status: DC
  Filled 2014-08-05: qty 1.5

## 2014-08-05 MED ORDER — ENOXAPARIN SODIUM 40 MG/0.4ML ~~LOC~~ SOLN
40.0000 mg | SUBCUTANEOUS | Status: DC
  Administered 2014-08-06: 40 mg via SUBCUTANEOUS
  Filled 2014-08-05 (×2): qty 0.4

## 2014-08-05 NOTE — Evaluation (Signed)
Occupational Therapy Evaluation Patient Details Name: CAMARON DEBERNARDI MRN: [id number] DOB: 1940/09/03 Today's Date: 08/05/2014    History of Present Illness Ms Warrens is a 74 yo female with DM II, HTN, CKD III presents, CAD s/p CABG 05/2014. She was admitted in Jan for B post-thoracotomy pleural effusion. She had failed to respond to diuresis and developed Acute pre-renal failure. She was then treated with repeat thoracentesis, showing exudative effusions, and NSAIDS. She was discharged on 07/31/14. Since D/C, she had slowly worsening dyspnea and was too bad that she presented to ED and found to have reaccumulated B pleural effusions.    Clinical Impression   Pt was performing ADL and light cooking the 3 days she was home at a modified independent level until worsening of dyspnea.  Pt generalizes her sternal precautions and implements energy conservation strategies in mobility and ADL  She sponge bathed and dressed independently today and is routinely taking herself to the bathroom here.  No acute OT needs, will defer IADL to [place].    Follow Up Recommendations  Home health OT;Supervision - Intermittent    Equipment Recommendations  None recommended by OT    Recommendations for Other Services       Precautions / Restrictions Precautions Precautions: Sternal Precaution Comments: pt is knowledgeable in sternal precautions Restrictions Weight Bearing Restrictions: Yes Other Position/Activity Restrictions: sternal precautions.      Mobility Bed Mobility Overal bed mobility:  (sitting in recliner)             General bed mobility comments: in chair on arrival  Transfers Overall transfer level: Modified independent   Transfers: Sit to/from Stand Sit to Stand: Modified independent (Device/Increase time)         General transfer comment: Pt did well with following sternal precautions.    Balance Overall balance assessment: Needs assistance   Sitting balance-Leahy  Scale: Good     Standing balance support: No upper extremity supported Standing balance-Leahy Scale: Good                              ADL Overall ADL's : Modified independent                         Toilet Transfer: Modified Independent;Ambulation;Comfort height toilet   Toileting- Clothing Manipulation and Hygiene: Modified independent;Sit to/from stand         General ADL Comments: Pt was educated in energy conservation last admission and implements them in mobility and ADL.     Vision     Perception     Praxis      Pertinent Vitals/Pain Pain Assessment: No/denies pain     Hand Dominance Right   Extremity/Trunk Assessment Upper Extremity Assessment Upper Extremity Assessment: Overall WFL for tasks assessed   Lower Extremity Assessment Lower Extremity Assessment: Defer to PT evaluation   Cervical / Trunk Assessment Cervical / Trunk Assessment: Normal   Communication Communication Communication: No difficulties   Cognition Arousal/Alertness: Awake/alert Behavior During Therapy: WFL for tasks assessed/performed Overall Cognitive Status: Within Functional Limits for tasks assessed                     General Comments       Exercises       Shoulder Instructions      Home Living Family/patient expects to be discharged to:: Private residence Living Arrangements: Alone Available Help at Discharge: Family;Available PRN/intermittently  Type of Home: House Home Access: Stairs to enter [employer] of Steps: 3 Entrance Stairs-Rails: Right Home Layout: One level     Bathroom Shower/Tub: Walk-in shower;Door   [employer] Toilet: Handicapped height Bathroom Accessibility: Yes How Accessible: Accessible via walker Home Equipment: Teaticket - 2 wheels;Cane - single point;Bedside commode;Shower seat          Prior Functioning/Environment Level of Independence: Independent        Comments: Pt was able to function  independently the 3 days she was home.    OT Diagnosis:     OT Problem List:     OT Treatment/Interventions:      OT Goals(Current goals can be found in the care plan section) Acute Rehab OT Goals Patient Stated Goal: to get stronger  OT Frequency:     Barriers to D/C:            Co-evaluation              End of Session Equipment Utilized During Treatment: Oxygen  Activity Tolerance: Patient limited by fatigue Patient left: in chair;with call bell/phone within reach;with nursing/sitter in room   Time: [id number] OT Time Calculation (min): 16 min Charges:  OT General Charges $OT Visit: 1 Procedure OT Evaluation $Initial OT Evaluation Tier I: 1 Procedure G-Codes:    Malka So 08/05/2014, 9:52 AM

## 2014-08-05 NOTE — Progress Notes (Signed)
Subjective:    She continues to be short of breath, but says she is better than yesterday. She does see that her lower extremity edema has improved. She is able to ambulate down the hallway without becoming hypoxic.  Interval Events: -CVTS has added Aldactone and plans chest x-ray for the morning.   Objective:   Vital Signs:   Temp:  [98.4 F (36.9 C)-98.5 F (36.9 C)] 98.4 F (36.9 C) (02/10 0411) Pulse Rate:  [81-100] 84 (02/10 1349) Resp:  [16-24] 18 (02/10 0411) BP: (125-154)/(54-70) 143/55 mmHg (02/10 1349) SpO2:  [97 %-100 %] 99 % (02/10 0905) Weight:  [186 lb 14.4 oz (84.777 kg)-190 lb (86.183 kg)] 186 lb 14.4 oz (84.777 kg) (02/10 0604) Last BM Date: 08/04/14  24-hour weight change: Weight change: 0 lb (0 kg)  Intake/Output:   Intake/Output Summary (Last 24 hours) at 08/05/14 1648 Last data filed at 08/04/14 1705  Gross per 24 hour  Intake      0 ml  Output      0 ml  Net      0 ml      Physical Exam: General: Vital signs reviewed and noted. Well-developed, well-nourished, in no acute distress; alert, appropriate and cooperative throughout examination.  Lungs:  Breathing more easily, bilateral crackles with decreased breath sounds at bilateral lung bases.  Heart: RRR. S1 and S2 normal without gallop, murmur, or rubs.  Abdomen:  BS normoactive. Soft, Nondistended, non-tender.  No masses or organomegaly.  Extremities: 1+ pitting edema to knees bilaterally.      Labs:  Basic Metabolic Panel:  Recent Labs Lab 07/30/14 0500 07/31/14 0548 08/04/14 0112 08/05/14 0436  NA 134* 139 137 138  K 4.4 4.4 4.4 4.3  CL 100 105 105 107  CO2 26 27 27 26   GLUCOSE 113* 146* 126* 143*  BUN 39* 31* 22 18  CREATININE 2.01* 1.79* 1.63* 1.42*  CALCIUM 8.3* 8.6 8.6 8.6    Liver Function Tests:  Recent Labs Lab 08/04/14 0112  AST 20  ALT 15  ALKPHOS 53  BILITOT 0.5  PROT 5.3*  ALBUMIN 2.9*   CBC:  Recent Labs Lab 08/04/14 0112  WBC 3.8*  NEUTROABS  2.1  HGB 9.4*  HCT 29.2*  MCV 86.9  PLT 158    CBG:  Recent Labs Lab 08/04/14 1124 08/04/14 1820 08/04/14 2210 08/05/14 0613 08/05/14 1126  GLUCAP 162* 119* 125* 139* 105*    Microbiology: Results for orders placed or performed during the hospital encounter of 07/26/14  MRSA PCR Screening     Status: None   Collection Time: 07/26/14  7:04 PM  Result Value Ref Range Status   MRSA by PCR NEGATIVE NEGATIVE Final    Comment:        The GeneXpert MRSA Assay (FDA approved for NASAL specimens only), is one component of a comprehensive MRSA colonization surveillance program. It is not intended to diagnose MRSA infection nor to guide or monitor treatment for MRSA infections.   Body fluid culture     Status: None   Collection Time: 07/28/14  1:34 PM  Result Value Ref Range Status   Specimen Description FLUID PLEURAL LEFT  Final   Special Requests NONE  Final   Gram Stain   Final    FEW WBC PRESENT, PREDOMINANTLY MONONUCLEAR NO ORGANISMS SEEN Performed at [facility]    Culture   Final    NO GROWTH 3 DAYS Performed at [facility]    Report Status  08/01/2014 FINAL  Final    Coagulation Studies:  Recent Labs  08/04/14 0430  LABPROT 13.8  INR 1.05     Imaging: Dg Chest 2 View  08/04/2014   CLINICAL DATA:  Shortness of Breath  EXAM: CHEST  2 VIEW  COMPARISON:  August 03, 2014  FINDINGS: There is patchy consolidation in both lung bases with pleural effusions bilaterally. Lungs elsewhere clear. Heart is borderline prominent with pulmonary vascularity within normal limits. Patient is status post coronary artery bypass grafting. No adenopathy. No pneumothorax. There is postoperative change in the lower cervical spine.  IMPRESSION: Patchy bibasilar consolidation with pleural effusions bilaterally. No appreciable change from 1 day prior.   Electronically Signed   By: Lowella Grip III M.D.   On: 08/04/2014 08:17       Medications:     Infusions:    Scheduled Medications: . amLODipine  10 mg Oral Daily  . aspirin EC  325 mg Oral Daily  . atorvastatin  40 mg Oral q1800  . [START ON 08/06/2014] cefUROXime (ZINACEF)  IV  1.5 g Intravenous Once  . ferrous Q000111Q C-folic acid  1 capsule Oral Q breakfast  . fluticasone  2 spray Each Nare Daily  . furosemide  40 mg Oral Daily  . heparin  5,000 Units Subcutaneous 3 times per day  . hydrALAZINE  75 mg Oral 3 times per day  . insulin aspart  0-9 Units Subcutaneous TID WC  . polyethylene glycol  17 g Oral Daily  . primidone  50 mg Oral Daily  . sodium chloride  1 spray Each Nare TID AC & HS  . sodium chloride  3 mL Intravenous Q12H  . sodium chloride  3 mL Intravenous Q12H  . spironolactone  25 mg Oral Daily  . traZODone  50 mg Oral QHS    PRN Medications: sodium chloride, acetaminophen, ondansetron **OR** ondansetron (ZOFRAN) IV, sodium chloride, traMADol   Assessment/ Plan:    Principal Problem:   Bilateral pleural effusion Active Problems:   Chronic kidney disease (CKD), stage III (moderate)   HTN (hypertension)   Diabetes mellitus type 2 in obese   S/P CABG x 4   Diastolic CHF, chronic  #Post-thoracotomy pleural effusions Breathing has improved, but she has had little urine output on Lasix. I doubt that diuresis will improve her pleural effusions. She is likely feeling better due to supplemental oxygen. She needs placement of Pleurx catheters. -Continue Lasix 40 mg by mouth daily. -Started on spironolactone 25 mg daily by cardiovascular surgery. -Will need Pleurx catheters for drainage. Appreciate help from cardiothoracic surgery for prompt placement. -Nothing by mouth after midnight for possible procedure. -Cefuroxime for surgical prophylaxis. -Supplemental oxygen for comfort.  -Continue to work with PT.  #Lower extremity edema Slightly improved. -Continue diuresis as above.  #Constipation -MiraLAX daily for prevention.  #CAD s/p  CABG -Continue aspirin 325 mg daily. -Continue atorvastatin.  #HTN -Continue amlodipine 10 mg daily, hydralazine 75 mg TID.  #DM2 -CBGs ACHS. -SSI-S. -Hold home Lantus.  #Rhinitis -Continue Flonase and saline nasal spray.  #Fibromyalgia -Continue primidone and tramadol 50 mg q6h PRN.   DVT PPX - heparin  CODE STATUS - Full  CONSULTS PLACED - Cardiothoracic surgery  DISPO - Deferred, awaiting resolution of pleural effusions.  The patient does have a current PCP Allie Dimmer, MD) and does not need an [facility] hospital follow-up appointment after discharge.    Is the [facility] hospital follow-up appointment a one-time only appointment? N/A.  Does the patient have  transportation limitations that hinder transportation to clinic appointments? yes   SERVICE NEEDED AT [place]         Y = Yes, Blank = No PT: Home health  OT: Home health  RN:   Equipment:   Other:      Length of Stay: 1 day(s)   Signed: Arman Filter, MD  PGY-1, Internal Medicine Resident Pager: [phone] (7AM-5PM) 08/05/2014, 4:48 PM

## 2014-08-05 NOTE — Progress Notes (Addendum)
[address]       [place] [address]             [phone]          Subjective: Feels ok with supplemental O2  Objective: Vital signs in last 24 hours: Temp:  [98.2 F (36.8 C)-98.5 F (36.9 C)] 98.4 F (36.9 C) (02/10 0411) Pulse Rate:  [82-100] 83 (02/10 0411) Cardiac Rhythm:  [-] Normal sinus rhythm (02/10 0732) Resp:  [14-24] 18 (02/10 0411) BP: (111-157)/(47-97) 132/56 mmHg (02/10 0411) SpO2:  [95 %-100 %] 98 % (02/10 0411) Weight:  [186 lb 14.4 oz (84.777 kg)-190 lb (86.183 kg)] 186 lb 14.4 oz (84.777 kg) (02/10 0604)  Hemodynamic parameters for last 24 hours:    Intake/Output from previous day: 02/09 0701 - 02/10 0700 In: 503 [P.O.:500; I.V.:3] Out: -  Intake/Output this shift:    General appearance: alert, cooperative and no distress Heart: regular rate and rhythm Lungs: dim in bases Abdomen: benign Extremities: L>R LE edema Wound: incis healing well  Lab Results:  Recent Labs  08/04/14 0112  WBC 3.8*  HGB 9.4*  HCT 29.2*  PLT 158   BMET:  Recent Labs  08/04/14 0112 08/05/14 0436  NA 137 138  K 4.4 4.3  CL 105 107  CO2 27 26  GLUCOSE 126* 143*  BUN 22 18  CREATININE 1.63* 1.42*  CALCIUM 8.6 8.6    PT/INR:  Recent Labs  08/04/14 0430  LABPROT 13.8  INR 1.05   ABG    Component Value Date/Time   PHART 7.295* 06/15/2014 2013   HCO3 19.7* 06/15/2014 2013   TCO2 18 06/16/2014 1712   ACIDBASEDEF 6.0* 06/15/2014 2013   O2SAT 97.0 06/15/2014 2013   CBG (last 3)   Recent Labs  08/04/14 1124 08/04/14 1820 08/04/14 2210  GLUCAP 162* 119* 125*    Meds Scheduled Meds: . amLODipine  10 mg Oral Daily  . aspirin EC  325 mg Oral Daily  . atorvastatin  40 mg Oral q1800  . ferrous Q000111Q C-folic acid  1 capsule Oral Q breakfast  . fluticasone  2 spray Each Nare Daily  . furosemide  40 mg Oral Daily  . heparin  5,000 Units Subcutaneous 3 times per day  . hydrALAZINE  75 mg Oral 3 times per  day  . insulin aspart  0-9 Units Subcutaneous TID WC  . polyethylene glycol  17 g Oral Daily  . primidone  50 mg Oral Daily  . sodium chloride  1 spray Each Nare TID AC & HS  . sodium chloride  3 mL Intravenous Q12H  . sodium chloride  3 mL Intravenous Q12H  . traZODone  50 mg Oral QHS   Continuous Infusions:  PRN Meds:.sodium chloride, acetaminophen, ondansetron **OR** ondansetron (ZOFRAN) IV, sodium chloride, traMADol  Xrays Dg Chest 2 View  08/04/2014   CLINICAL DATA:  Shortness of Breath  EXAM: CHEST  2 VIEW  COMPARISON:  August 03, 2014  FINDINGS: There is patchy consolidation in both lung bases with pleural effusions bilaterally. Lungs elsewhere clear. Heart is borderline prominent with pulmonary vascularity within normal limits. Patient is status post coronary artery bypass grafting. No adenopathy. No pneumothorax. There is postoperative change in the lower cervical spine.  IMPRESSION: Patchy bibasilar consolidation with pleural effusions bilaterally. No appreciable change from 1 day prior.   Electronically Signed   By: Lowella Grip III M.D.   On: 08/04/2014 08:17    Assessment/Plan:  1 appears fairly stable 2 Creat improved, cont diuretic, monitor closely 3 poss pleurx placement later in week if effusions cont to increase- monitor clinically and repeat CXR within the next few days- push pulm toilet- will re0rder flutter valve/IS   LOS: 1 day    GOLD,[name] 08/05/2014  Check CXR in am- if no better then  Pleurx cath L pleural space  patient examined and medical record reviewed,agree with above note. [name],[name] 08/05/2014

## 2014-08-05 NOTE — Progress Notes (Signed)
Procedure(s) (LRB): INSERTION PLEURAL DRAINAGE CATHETER (Left) Subjective: Less SOB but CXR shows mod l effsuion with elevated creat Check CXR in am and place pleurx if L effusion not improved Objective: Vital signs in last 24 hours: Temp:  [98.4 F (36.9 C)] 98.4 F (36.9 C) (02/10 0411) Pulse Rate:  [81-95] 84 (02/10 1349) Cardiac Rhythm:  [-] Normal sinus rhythm (02/10 0732) Resp:  [18] 18 (02/10 0411) BP: (132-150)/(55-65) 143/55 mmHg (02/10 1349) SpO2:  [98 %-99 %] 99 % (02/10 0905) Weight:  [186 lb 14.4 oz (84.777 kg)] 186 lb 14.4 oz (84.777 kg) (02/10 0604)  Hemodynamic parameters for last 24 hours:  nsr  Intake/Output from previous day: 02/09 0701 - 02/10 0700 In: 503 [P.O.:500; I.V.:3] Out: -  Intake/Output this shift:    CABG incisions healed nsr Clear breath sounds  Lab Results:  Recent Labs  08/04/14 0112  WBC 3.8*  HGB 9.4*  HCT 29.2*  PLT 158   BMET:  Recent Labs  08/04/14 0112 08/05/14 0436  NA 137 138  K 4.4 4.3  CL 105 107  CO2 27 26  GLUCOSE 126* 143*  BUN 22 18  CREATININE 1.63* 1.42*  CALCIUM 8.6 8.6    PT/INR:  Recent Labs  08/04/14 0430  LABPROT 13.8  INR 1.05   ABG    Component Value Date/Time   PHART 7.295* 06/15/2014 2013   HCO3 19.7* 06/15/2014 2013   TCO2 18 06/16/2014 1712   ACIDBASEDEF 6.0* 06/15/2014 2013   O2SAT 97.0 06/15/2014 2013   CBG (last 3)   Recent Labs  08/05/14 0613 08/05/14 1126 08/05/14 1748  GLUCAP 139* 105* 210*    Assessment/Plan: S/P Procedure(s) (LRB): INSERTION PLEURAL DRAINAGE CATHETER (Left) Prepare for pleurx tomorrow mid day   LOS: 1 day    [name],[name] 08/05/2014

## 2014-08-05 NOTE — Progress Notes (Signed)
  Date: 08/05/2014  Patient name: Joyce [name]  Medical record number: [id number]  Date of birth: [birth year]   This patient has been seen and the plan of care was discussed with the house staff. Please see their note for complete details. I concur with their findings with the following additions/corrections: Pt is sitting in chair, still a bit dyspnic but less than 2/9. She had to sleep sitting in chair. Was able to ambulate without hypoxia. CVTS added aldactone and will repeat CXR in AM and plan Pleurx if no better.   Bartholomew Crews, MD 08/05/2014, 3:55 PM

## 2014-08-05 NOTE — Progress Notes (Signed)
Utilization review completed. Nataki Mccrumb, RN, BSN. 

## 2014-08-05 NOTE — Evaluation (Signed)
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 She was admitted in Jan for B post-thoracotomy pleural effusion. She had failed to respond to diuresis and developed Acute pre-renal failure. She was then treated with repeat thoracentesis, showing exudative effusions, and NSAIDS. She was discharged on 07/31/14. Since D/C, she had slowly worsening dyspnea and was too bad that she presented to ED and found to have reaccumulated B pleural effusions.   Clinical Impression  Pt presents with dependencies in mobility affecting her independence due to dyspnea and pleural effusion. Pt's primary limiting factor is her decreased activity tolerance. Pt lives alone and I feel she will be safe to return home once medically stable. Pt reported she has HHPT set-up and then cardiac rehab. Pt ambulated 62ft on RA and O2 sats 92%. Pt would benefit from frequent walks on the unit to build up her endurance. Pt would benefit from continued skilled PT to maximize her mobility and Independence for return home.    Follow Up Recommendations Home health PT    Equipment Recommendations  None recommended by PT    Recommendations for Other Services       Precautions / Restrictions Precautions Precautions: Sternal Restrictions Weight Bearing Restrictions: Yes Other Position/Activity Restrictions: sternal precautions.      Mobility  Bed Mobility Overal bed mobility:  (sitting in recliner)                Transfers Overall transfer level: Modified independent   Transfers: Sit to/from Stand Sit to Stand: Modified independent (Device/Increase time)            Ambulation/Gait Ambulation/Gait assistance: Supervision Ambulation Distance (Feet): 90 Feet Assistive device: None Gait Pattern/deviations: Step-through  pattern;Decreased stride length;Trunk flexed Gait velocity: decreased   General Gait Details: c/o SOB with gait and fatigue. o2 sats 92%RA with gait  Stairs            Wheelchair Mobility    Modified Rankin (Stroke Patients Only)       Balance Overall balance assessment: Needs assistance         Standing balance support: No upper extremity supported Standing balance-Leahy Scale: Good                               Pertinent Vitals/Pain Pain Assessment: No/denies pain    Home Living Family/patient expects to be discharged to:: Private residence Living Arrangements: Alone Available Help at Discharge: Family;Available PRN/intermittently Type of Home: House Home Access: Stairs to enter Entrance Stairs-Rails: Right Entrance Stairs-Number of Steps: 3 Home Layout: One level Home Equipment: Walker - 2 wheels;Cane - single point;Bedside commode;Shower seat      Prior Function Level of Independence: Independent               Hand Dominance        Extremity/Trunk Assessment   Upper Extremity Assessment: Defer to OT evaluation           Lower Extremity Assessment: Overall WFL for tasks assessed      Cervical / Trunk Assessment: Normal  Communication   Communication: No difficulties  Cognition Arousal/Alertness: Awake/alert Behavior During Therapy: WFL for tasks assessed/performed Overall Cognitive Status: Within Functional Limits for tasks assessed  General Comments      Exercises        Assessment/Plan    PT Assessment    PT Diagnosis Difficulty walking;Generalized weakness   PT Problem List Decreased strength;Decreased activity tolerance;Decreased mobility;Cardiopulmonary status limiting activity  PT Treatment Interventions Gait training;Functional mobility training;Therapeutic activities;Therapeutic exercise;Patient/family education   PT Goals (Current goals can be found in the Care Plan  section) Acute Rehab PT Goals Patient Stated Goal: to get stronger PT Goal Formulation: With patient Time For Goal Achievement: 08/19/14 Potential to Achieve Goals: Good    Frequency Min 3X/week   Barriers to discharge        Co-evaluation               End of Session Equipment Utilized During Treatment: Gait belt Activity Tolerance: Patient limited by fatigue Patient left: in chair;with call bell/phone within reach Nurse Communication: Mobility status (frequent ambulation with staff)         [id number] PT Time Calculation (min) (ACUTE ONLY): 25 min   Charges:   PT Evaluation $Initial PT Evaluation Tier I: 1 Procedure PT Treatments $Gait Training: 8-22 mins   PT G Codes:        Lelon Mast 08/05/2014, 9:13 AM

## 2014-08-06 ENCOUNTER — Encounter (HOSPITAL_COMMUNITY)
Admission: EM | Disposition: A | Discharge status: Home Health | Payer: Self-pay | Source: Home / Self Care | Attending: Internal Medicine

## 2014-08-06 ENCOUNTER — Inpatient Hospital Stay (HOSPITAL_COMMUNITY): Payer: Medicare Other

## 2014-08-06 DIAGNOSIS — K5900 Constipation, unspecified: Secondary | ICD-10-CM

## 2014-08-06 DIAGNOSIS — I10 Essential (primary) hypertension: Secondary | ICD-10-CM

## 2014-08-06 LAB — BASIC METABOLIC PANEL
Anion gap: 7 (ref 5–15)
BUN: 8 mg/dL (ref 6–23)
CALCIUM: 9.1 mg/dL (ref 8.4–10.5)
CO2: 23 mmol/L (ref 19–32)
Chloride: 110 mmol/L (ref 96–112)
Creatinine, Ser: 1.14 mg/dL — ABNORMAL HIGH (ref 0.50–1.10)
GFR calc Af Amer: 55 mL/min — ABNORMAL LOW (ref >90)
GFR calc non Af Amer: 47 mL/min — ABNORMAL LOW (ref >90)
GLUCOSE: 95 mg/dL (ref 70–99)
Potassium: 3.9 mmol/L (ref 3.5–5.1)
SODIUM: 140 mmol/L (ref 135–145)

## 2014-08-06 LAB — GLUCOSE, CAPILLARY
GLUCOSE-CAPILLARY: 112 mg/dL — AB (ref 70–99)
Glucose-Capillary: 155 mg/dL — ABNORMAL HIGH (ref 70–99)
Glucose-Capillary: 184 mg/dL — ABNORMAL HIGH (ref 70–99)

## 2014-08-06 SURGERY — INSERTION, PLEURAL DRAINAGE CATHETER
Anesthesia: Monitor Anesthesia Care | Site: Chest | Laterality: Left

## 2014-08-06 MED ORDER — PREDNISONE 20 MG PO TABS
20.0000 mg | ORAL_TABLET | Freq: Every day | ORAL | Status: DC
  Administered 2014-08-06 – 2014-08-07 (×2): 20 mg via ORAL
  Filled 2014-08-06 (×3): qty 1

## 2014-08-06 NOTE — Progress Notes (Signed)
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 She was admitted in Jan for B post-thoracotomy pleural effusion. She had failed to respond to diuresis and developed Acute pre-renal failure. She was then treated with repeat thoracentesis, showing exudative effusions, and NSAIDS. She was discharged on 07/31/14. Since D/C, she had slowly worsening dyspnea and was too bad that she presented to ED and found to have reaccumulated B pleural effusions.     PT Comments    Pt was seen for continued work on her enduranceand fortunately was able to get out with nursing to walk earlier.  Her current O2 sats are good but despite not dropping at all below 91% during gait, she still needed to rest to catch her breath.  HHPT will follow her at home  Follow Up Recommendations  Home health PT     Equipment Recommendations  None recommended by PT    Recommendations for Other Services       Precautions / Restrictions Precautions Precautions: Sternal Precaution Comments: pt is knowledgeable in sternal precautions Restrictions Weight Bearing Restrictions: No Other Position/Activity Restrictions: sternal precautions.    Mobility  Bed Mobility               General bed mobility comments: up in chair when PT arrived  Transfers Overall transfer level: Modified independent Equipment used: 1 person hand held assist (used pulse ox machine for support) Transfers: Sit to/from [facility] Sit to Stand: Modified independent (Device/Increase time) Stand pivot transfers: Modified independent (Device/Increase time)          Ambulation/Gait Ambulation/Gait assistance: Supervision Ambulation Distance (Feet): 150 Feet Assistive device:  (pulse ox machine) Gait Pattern/deviations: Step-through  pattern;Decreased step length - right;Decreased step length - left;Shuffle;Trunk flexed Gait velocity: decreased Gait velocity interpretation: Below normal speed for age/gender General Gait Details: Pt was observed to have good breath control initially and maintained O2 sat at 91% or higher.  Pt used machine for light support, but needed to stop x4 for short breaks to control feeling SOB   Stairs            Wheelchair Mobility    Modified Rankin (Stroke Patients Only)       Balance Overall balance assessment: Needs assistance Sitting-balance support: Feet supported Sitting balance-Leahy Scale: Good   Postural control: Posterior lean Standing balance support: Single extremity supported Standing balance-Leahy Scale: Fair Standing balance comment: Needs to use wall rail for standing rests                    Cognition Arousal/Alertness: Awake/alert Behavior During Therapy: WFL for tasks assessed/performed Overall Cognitive Status: Within Functional Limits for tasks assessed                      Exercises      General Comments General comments (skin integrity, edema, etc.): 95% O2  at rest and 91% after total walk      Pertinent Vitals/Pain Pain Assessment: 0-10 Pain Score: 4  Pain Location: sternum Pain Intervention(s): Premedicated before session;Monitored during session;Limited activity within patient's tolerance    Home Living                      Prior Function  PT Goals (current goals can now be found in the care plan section) Acute Rehab PT Goals Patient Stated Goal: to get stronger PT Goal Formulation: With patient Progress towards PT goals: Progressing toward goals    Frequency  Min 3X/week    PT Plan Current plan remains appropriate    Co-evaluation             End of Session Equipment Utilized During Treatment: Other (comment) (Pulse ox machine) Activity Tolerance: Patient limited by fatigue  (SOB) Patient left: in chair;with call bell/phone within reach;with family/visitor present     Time: 1650-1709 PT Time Calculation (min) (ACUTE ONLY): 19 min  Charges:  $Gait Training: 8-22 mins $Therapeutic Activity: 8-22 mins                    G Codes:      Ramond Dial 08/16/2014, 5:50 PM   Mee Hives, PT Joyce Acute Rehab Dept. Number: [phone]

## 2014-08-06 NOTE — Plan of Care (Signed)
Problem: Phase I Progression Outcomes Goal: OOB as tolerated unless otherwise ordered Outcome: Progressing Patient still dyspenic at rest.

## 2014-08-06 NOTE — Progress Notes (Signed)
Subjective:    She says her shortness of breath is stable from yesterday. Her lower extremity edema is slightly improved.  Interval Events: -Chest x-ray with increasing pulmonary infiltrates with persistent pleural effusions. -CVTS had planned for Pleurx placement today, but this was canceled because Dr. Prescott Gum felt her pleural effusions have improved on diuretics.   Objective:   Vital Signs:   Temp:  [98 F (36.7 C)-99.1 F (37.3 C)] 98 F (36.7 C) (02/11 0459) Pulse Rate:  [75-89] 89 (02/11 0947) Resp:  [18-19] 19 (02/11 0459) BP: (123-143)/(51-61) 136/51 mmHg (02/11 0947) SpO2:  [94 %-98 %] 94 % (02/11 0947) Weight:  [186 lb 9.6 oz (84.641 kg)] 186 lb 9.6 oz (84.641 kg) (02/11 0459) Last BM Date: 08/04/14  24-hour weight change: Weight change: -3 lb 6.4 oz (-1.542 kg)  Intake/Output:  No intake or output data in the 24 hours ending 08/06/14 1024    Physical Exam: General: Vital signs reviewed and noted. Well-developed, well-nourished, in no acute distress; alert, appropriate and cooperative throughout examination.  Lungs:  Bilateral crackles with decreased breath sounds at bilateral lung bases, stable.   Heart: RRR. S1 and S2 normal without gallop, murmur, or rubs.  Abdomen:  BS normoactive. Soft, Nondistended, non-tender.  No masses or organomegaly.  Extremities: 1+ pitting edema to knees bilaterally, stable.      Labs:  Basic Metabolic Panel:  Recent Labs Lab 07/31/14 0548 08/04/14 0112 08/05/14 0436 08/06/14 0558  NA 139 137 138 140  K 4.4 4.4 4.3 3.9  CL 105 105 107 110  CO2 27 27 26 23   GLUCOSE 146* 126* 143* 95  BUN 31* 22 18 8   CREATININE 1.79* 1.63* 1.42* 1.14*  CALCIUM 8.6 8.6 8.6 9.1    Liver Function Tests:  Recent Labs Lab 08/04/14 0112  AST 20  ALT 15  ALKPHOS 53  BILITOT 0.5  PROT 5.3*  ALBUMIN 2.9*   CBC:  Recent Labs Lab 08/04/14 0112  WBC 3.8*  NEUTROABS 2.1  HGB 9.4*  HCT 29.2*  MCV 86.9  PLT 158     CBG:  Recent Labs Lab 08/05/14 0613 08/05/14 1126 08/05/14 1748 08/05/14 2132 08/06/14 0559  GLUCAP 139* 105* 210* 109* 155*    Microbiology: Results for orders placed or performed during the hospital encounter of 07/26/14  MRSA PCR Screening     Status: None   Collection Time: 07/26/14  7:04 PM  Result Value Ref Range Status   MRSA by PCR NEGATIVE NEGATIVE Final    Comment:        The GeneXpert MRSA Assay (FDA approved for NASAL specimens only), is one component of a comprehensive MRSA colonization surveillance program. It is not intended to diagnose MRSA infection nor to guide or monitor treatment for MRSA infections.   Body fluid culture     Status: None   Collection Time: 07/28/14  1:34 PM  Result Value Ref Range Status   Specimen Description FLUID PLEURAL LEFT  Final   Special Requests NONE  Final   Gram Stain   Final    FEW WBC PRESENT, PREDOMINANTLY MONONUCLEAR NO ORGANISMS SEEN Performed at [facility]    Culture   Final    NO GROWTH 3 DAYS Performed at [facility]    Report Status 08/01/2014 FINAL  Final    Coagulation Studies:  Recent Labs  08/04/14 0430  LABPROT 13.8  INR 1.05     Imaging: Dg Chest 2 View  08/06/2014  CLINICAL DATA:  Shortness of breath.  EXAM: CHEST  2 VIEW  COMPARISON:  08/04/2014.  FINDINGS: Mediastinum and hilar structures are normal. Heart size stable. Prior CABG. Bibasilar pulmonary infiltrates and bilateral effusions are noted. Bibasilar infiltrates increased from prior exam. No pneumothorax. Prior cervical spine fusion.  IMPRESSION: 1. Interim increase in bibasilar pulmonary infiltrates. 2. Persistent bilateral pleural effusions. 3.  Prior CABG.  Heart size stable.  Pulmonary vascularity normal.   Electronically Signed   By: Marcello Moores  Register   On: 08/06/2014 07:33       Medications:    Infusions:    Scheduled Medications: . amLODipine  10 mg Oral Daily  . aspirin EC  325 mg Oral Daily   . atorvastatin  40 mg Oral q1800  . enoxaparin (LOVENOX) injection  40 mg Subcutaneous Q24H  . ferrous [id number] C-folic acid  1 capsule Oral Q breakfast  . fluticasone  2 spray Each Nare Daily  . furosemide  40 mg Oral Daily  . hydrALAZINE  75 mg Oral 3 times per day  . insulin aspart  0-9 Units Subcutaneous TID WC  . polyethylene glycol  17 g Oral Daily  . predniSONE  20 mg Oral Q breakfast  . primidone  50 mg Oral Daily  . sodium chloride  1 spray Each Nare TID AC & HS  . sodium chloride  3 mL Intravenous Q12H  . sodium chloride  3 mL Intravenous Q12H  . spironolactone  25 mg Oral Daily  . traZODone  50 mg Oral QHS    PRN Medications: sodium chloride, acetaminophen, ondansetron **OR** ondansetron (ZOFRAN) IV, sodium chloride, traMADol   Assessment/ Plan:    Principal Problem:   Bilateral pleural effusion Active Problems:   Chronic kidney disease (CKD), stage III (moderate)   HTN (hypertension)   Diabetes mellitus type 2 in obese   S/P CABG x 4   Diastolic CHF, chronic  #Post-thoracotomy pleural effusions Breathing stable. Bilateral pleural effusions look stable to slightly worse from 08/04/2014 on my read. Possibly slightly better from admission chest x-ray. Cardiothoracic surgery has canceled placement of Pleurx catheter for today. I do think she still needs placement of the Pleurx catheter prior to discharge. Cardiothoracic surgery has ordered prednisone 20 mg daily for post CABG pleural effusions, despite no improvement on NSAIDs. Doubt heart failure consultation will help. -Continue Lasix 40 mg and spironolactone 25 mg by mouth daily per cardiothoracic surgery recommendations. -Prednisone 20 mg daily ordered by cardiothoracic surgery. -Will need Pleurx catheters for drainage. Appreciate help from cardiothoracic surgery for prompt placement. -Heart healthy/carb modified diet. -Supplemental oxygen for comfort.  -Continue to work with PT.  #Lower extremity  edema Stable. -Continue diuresis as above.  #Constipation -MiraLAX daily for prevention.  #CAD s/p CABG -Continue aspirin 325 mg daily. -Continue atorvastatin.  #HTN -Continue amlodipine 10 mg daily, hydralazine 75 mg TID.  #DM2 We'll monitor blood sugars now that prednisone has been started by cardiothoracic surgery. -CBGs ACHS. -SSI-S. -Hold home Lantus.  #Rhinitis -Continue Flonase and saline nasal spray.  #Fibromyalgia -Continue primidone and tramadol 50 mg q6h PRN.   DVT PPX - heparin  CODE STATUS - Full  CONSULTS PLACED - Cardiothoracic surgery  DISPO - Deferred, awaiting resolution of pleural effusions.  The patient does have a current PCP Allie Dimmer, MD) and does not need an [facility] hospital follow-up appointment after discharge.    Is the [facility] hospital follow-up appointment a one-time only appointment? N/A.  Does the patient have transportation limitations that hinder  transportation to clinic appointments? yes   SERVICE NEEDED AT [place]         Y = Yes, Blank = No PT: Home health  OT: Home health  RN:   Equipment:   Other:      Length of Stay: 2 day(s)   Signed: Arman Filter, MD  PGY-1, Internal Medicine Resident Pager: [phone] (7AM-5PM) 08/06/2014, 10:24 AM

## 2014-08-06 NOTE — Progress Notes (Addendum)
  Date: 08/06/2014  Patient name: Joyce [name]  Medical record number: [id number]  Date of birth: August 16, 1940   This patient has been seen and the plan of care was discussed with the house staff. Please see their note for complete details. I concur with their findings with the following additions/corrections: Ms Bidgood is still dyspnic at rest and has to sleep sitting up 2/2 orthopnea. Her weight is reported as -4 L. Her CXR cont to show B pleural effusions despite lasix and the additional of aldactone by CVTS. Her effusion are not 2/2 HF - they are exudative, lymphocytic predominance, did not respond to lasix last admit despite pre-renal azotemia, she only has grade 1 diastolic dysfxn and nl EF, and her BNP is 160 despite slight renal dysfxn. She did not respond to a trial of NSAID's to prevent re accumulation but prednisone has now been added. IM, unfortunately, has nothing to offer in terms of definitve treatment of her persistent effusions and we are dependent on CVTS for Pleurx placement.   Bartholomew Crews, MD 08/06/2014, 11:21 AM

## 2014-08-06 NOTE — Progress Notes (Signed)
CARDIAC REHAB PHASE I   PRE:  Rate/Rhythm: 92 SR  BP:  Supine:   Sitting: 140/70  Standing:    SaO2: 91 RA  MODE:  Ambulation: 250 ft   POST:  Rate/Rhythm: 109  BP:  Supine:   Sitting: 152/70  Standing:    SaO2: 87 RA 1450-1520 Assisted X 1 and used walker to ambulate. Gait steady with walker. Pt is DOE and took several short standing rest stops due to it. RA sat after walk 87%, with rest increased to 92%. Pt back to recliner after walk with call light in reach.  Rodney Langton RN 08/06/2014 3:18 PM

## 2014-08-06 NOTE — Progress Notes (Signed)
Procedure(s) (LRB): INSERTION PLEURAL DRAINAGE CATHETER (Left) Subjective: Up in chair breathing comfortably CXR today show only mild pleural effusions Pleurx catheter not indicated- cancelled rec cardiology consult for diastolic HF Will give short course of prednisone for possible postop pleural inflamation Objective: Vital signs in last 24 hours: Temp:  [98 F (36.7 C)-99.1 F (37.3 C)] 98 F (36.7 C) (02/11 0459) Pulse Rate:  [75-84] 84 (02/11 0459) Cardiac Rhythm:  [-] Normal sinus rhythm (02/10 2041) Resp:  [18-19] 19 (02/11 0459) BP: (123-143)/(55-64) 123/57 mmHg (02/11 0459) SpO2:  [97 %-99 %] 97 % (02/11 0459) Weight:  [186 lb 9.6 oz (84.641 kg)] 186 lb 9.6 oz (84.641 kg) (02/11 0459)  Hemodynamic parameters for last 24 hours:  stable  Intake/Output from previous day:   Intake/Output this shift:    nsr Mild pedal edema  Lab Results:  Recent Labs  08/04/14 0112  WBC 3.8*  HGB 9.4*  HCT 29.2*  PLT 158   BMET:  Recent Labs  08/04/14 0112 08/05/14 0436  NA 137 138  K 4.4 4.3  CL 105 107  CO2 27 26  GLUCOSE 126* 143*  BUN 22 18  CREATININE 1.63* 1.42*  CALCIUM 8.6 8.6    PT/INR:  Recent Labs  08/04/14 0430  LABPROT 13.8  INR 1.05   ABG    Component Value Date/Time   PHART 7.295* 06/15/2014 2013   HCO3 19.7* 06/15/2014 2013   TCO2 18 06/16/2014 1712   ACIDBASEDEF 6.0* 06/15/2014 2013   O2SAT 97.0 06/15/2014 2013   CBG (last 3)   Recent Labs  08/05/14 1748 08/05/14 2132 08/06/14 0559  GLUCAP 210* 109* 155*    Assessment/Plan: S/P Procedure(s) (LRB): INSERTION PLEURAL DRAINAGE CATHETER (Left) Plan medical therapy for renal and diastolic LV dysfx   LOS: 2 days    Joyce [name],Joyce [name] 08/06/2014

## 2014-08-07 ENCOUNTER — Inpatient Hospital Stay (HOSPITAL_COMMUNITY): Payer: Medicare Other | Admitting: Certified Registered Nurse Anesthetist

## 2014-08-07 ENCOUNTER — Encounter (HOSPITAL_COMMUNITY)
Admission: EM | Disposition: A | Discharge status: Home Health | Payer: Self-pay | Source: Home / Self Care | Attending: Internal Medicine

## 2014-08-07 ENCOUNTER — Inpatient Hospital Stay (HOSPITAL_COMMUNITY): Payer: Medicare Other

## 2014-08-07 ENCOUNTER — Encounter (HOSPITAL_COMMUNITY): Payer: Self-pay | Admitting: Anesthesiology

## 2014-08-07 DIAGNOSIS — J90 Pleural effusion, not elsewhere classified: Secondary | ICD-10-CM

## 2014-08-07 HISTORY — PX: CHEST TUBE INSERTION: SHX231

## 2014-08-07 LAB — GLUCOSE, CAPILLARY
GLUCOSE-CAPILLARY: 128 mg/dL — AB (ref 70–99)
GLUCOSE-CAPILLARY: 165 mg/dL — AB (ref 70–99)
GLUCOSE-CAPILLARY: 115 mg/dL — AB (ref 70–99)
Glucose-Capillary: 126 mg/dL — ABNORMAL HIGH (ref 70–99)

## 2014-08-07 SURGERY — INSERTION, PLEURAL DRAINAGE CATHETER
Anesthesia: Monitor Anesthesia Care | Site: Chest | Laterality: Left

## 2014-08-07 MED ORDER — FENTANYL CITRATE 0.05 MG/ML IJ SOLN
INTRAMUSCULAR | Status: DC | PRN
  Administered 2014-08-07: 50 ug via INTRAVENOUS
  Administered 2014-08-07: 25 ug via INTRAVENOUS

## 2014-08-07 MED ORDER — PROPOFOL INFUSION 10 MG/ML OPTIME
INTRAVENOUS | Status: DC | PRN
  Administered 2014-08-07: 50 ug/kg/min via INTRAVENOUS

## 2014-08-07 MED ORDER — TRAMADOL HCL 50 MG PO TABS
50.0000 mg | ORAL_TABLET | Freq: Four times a day (QID) | ORAL | Status: DC | PRN
  Administered 2014-08-07: 50 mg via ORAL

## 2014-08-07 MED ORDER — DEXTROSE 5 % IV SOLN
1.5000 g | INTRAVENOUS | Status: DC
  Filled 2014-08-07 (×2): qty 1.5

## 2014-08-07 MED ORDER — MIDAZOLAM HCL 5 MG/5ML IJ SOLN
INTRAMUSCULAR | Status: DC | PRN
  Administered 2014-08-07: 2 mg via INTRAVENOUS

## 2014-08-07 MED ORDER — PROPOFOL 10 MG/ML IV BOLUS
INTRAVENOUS | Status: AC
  Filled 2014-08-07: qty 20

## 2014-08-07 MED ORDER — LIDOCAINE HCL 1 % IJ SOLN
INTRAMUSCULAR | Status: DC | PRN
  Administered 2014-08-07: 15 mL

## 2014-08-07 MED ORDER — SODIUM CHLORIDE 0.9 % IV SOLN
INTRAVENOUS | Status: DC
  Administered 2014-08-07 – 2014-08-12 (×6): via INTRAVENOUS

## 2014-08-07 MED ORDER — LIDOCAINE HCL (CARDIAC) 20 MG/ML IV SOLN
INTRAVENOUS | Status: DC | PRN
  Administered 2014-08-07: 100 mg via INTRAVENOUS

## 2014-08-07 MED ORDER — MIDAZOLAM HCL 2 MG/2ML IJ SOLN
INTRAMUSCULAR | Status: AC
  Filled 2014-08-07: qty 2

## 2014-08-07 MED ORDER — SODIUM CHLORIDE 0.9 % IV SOLN
INTRAVENOUS | Status: DC | PRN
  Administered 2014-08-07: 14:00:00 via INTRAVENOUS

## 2014-08-07 MED ORDER — FENTANYL CITRATE 0.05 MG/ML IJ SOLN
INTRAMUSCULAR | Status: AC
  Filled 2014-08-07: qty 5

## 2014-08-07 MED ORDER — DEXTROSE 5 % IV SOLN
1.5000 g | Freq: Once | INTRAVENOUS | Status: AC
  Administered 2014-08-07: 1.5 g via INTRAVENOUS
  Filled 2014-08-07: qty 1.5

## 2014-08-07 SURGICAL SUPPLY — 33 items
ADH SKN CLS APL DERMABOND .7 (GAUZE/BANDAGES/DRESSINGS) ×1
CANISTER SUCTION 2500CC (MISCELLANEOUS) ×3 IMPLANT
COVER SURGICAL LIGHT HANDLE (MISCELLANEOUS) ×3 IMPLANT
DERMABOND ADVANCED (GAUZE/BANDAGES/DRESSINGS) ×2
DERMABOND ADVANCED .7 DNX12 (GAUZE/BANDAGES/DRESSINGS) ×1 IMPLANT
DRAPE C-ARM 42X72 X-RAY (DRAPES) ×3 IMPLANT
DRAPE LAPAROSCOPIC ABDOMINAL (DRAPES) ×3 IMPLANT
DRSG COVADERM 4X10 (GAUZE/BANDAGES/DRESSINGS) ×2 IMPLANT
GLOVE BIO SURGEON STRL SZ7.5 (GLOVE) ×6 IMPLANT
GLOVE BIOGEL PI IND STRL 6 (GLOVE) IMPLANT
GLOVE BIOGEL PI IND STRL 7.0 (GLOVE) IMPLANT
GLOVE BIOGEL PI INDICATOR 6 (GLOVE) ×2
GLOVE BIOGEL PI INDICATOR 7.0 (GLOVE) ×2
GOWN STRL REUS W/ TWL LRG LVL3 (GOWN DISPOSABLE) ×2 IMPLANT
GOWN STRL REUS W/TWL LRG LVL3 (GOWN DISPOSABLE) ×6
KIT BASIN OR (CUSTOM PROCEDURE TRAY) ×3 IMPLANT
KIT PLEURX DRAIN CATH 1000ML (MISCELLANEOUS) ×3 IMPLANT
KIT PLEURX DRAIN CATH 15.5FR (DRAIN) ×3 IMPLANT
KIT ROOM TURNOVER OR (KITS) ×3 IMPLANT
NDL HYPO 25GX1X1/2 BEV (NEEDLE) ×1 IMPLANT
NEEDLE HYPO 25GX1X1/2 BEV (NEEDLE) ×3 IMPLANT
NS IRRIG 1000ML POUR BTL (IV SOLUTION) ×3 IMPLANT
PACK GENERAL/GYN (CUSTOM PROCEDURE TRAY) ×3 IMPLANT
PAD ARMBOARD 7.5X6 YLW CONV (MISCELLANEOUS) ×6 IMPLANT
SET DRAINAGE LINE (MISCELLANEOUS) IMPLANT
SPONGE GAUZE 4X4 12PLY STER LF (GAUZE/BANDAGES/DRESSINGS) ×2 IMPLANT
SUT ETHILON 3 0 PS 1 (SUTURE) ×3 IMPLANT
SUT SILK 2 0 SH (SUTURE) ×3 IMPLANT
SUT VIC AB 3-0 SH 8-18 (SUTURE) ×2 IMPLANT
SYR CONTROL 10ML LL (SYRINGE) ×3 IMPLANT
TOWEL OR 17X24 6PK STRL BLUE (TOWEL DISPOSABLE) ×3 IMPLANT
TOWEL OR 17X26 10 PK STRL BLUE (TOWEL DISPOSABLE) ×3 IMPLANT
WATER STERILE IRR 1000ML POUR (IV SOLUTION) ×3 IMPLANT

## 2014-08-07 NOTE — Progress Notes (Signed)
Medicare Important Message given? YES (If response is "NO", the following Medicare IM given date fields will be blank) Date Medicare IM given:08/07/2014 Medicare IM given by: Whitman Hero

## 2014-08-07 NOTE — Progress Notes (Signed)
Procedure(s) (LRB): INSERTION PLEURAL DRAINAGE CATHETER (Left) Subjective: L pleural effusion increased on am CXR Will place L Pleurx later today- pt made NPO  Objective: Vital signs in last 24 hours: Temp:  [98.3 F (36.8 C)-98.8 F (37.1 C)] 98.3 F (36.8 C) (02/12 0704) Pulse Rate:  [77-94] 88 (02/12 0704) Cardiac Rhythm:  [-] Normal sinus rhythm (02/11 2000) Resp:  [18-20] 18 (02/12 0704) BP: (135-163)/(51-71) 163/71 mmHg (02/12 0704) SpO2:  [93 %-97 %] 93 % (02/12 0704) Weight:  [187 lb 6.3 oz (85 kg)] 187 lb 6.3 oz (85 kg) (02/12 0704)  Hemodynamic parameters for last 24 hours:  nsr  Intake/Output from previous day: 02/11 0701 - 02/12 0700 In: 120 [P.O.:120] Out: -  Intake/Output this shift:    Increased L effusion  Lab Results: No results for input(s): WBC, HGB, HCT, PLT in the last 72 hours. BMET:  Recent Labs  08/05/14 0436 08/06/14 0558  NA 138 140  K 4.3 3.9  CL 107 110  CO2 26 23  GLUCOSE 143* 95  BUN 18 8  CREATININE 1.42* 1.14*  CALCIUM 8.6 9.1    PT/INR: No results for input(s): LABPROT, INR in the last 72 hours. ABG    Component Value Date/Time   PHART 7.295* 06/15/2014 2013   HCO3 19.7* 06/15/2014 2013   TCO2 18 06/16/2014 1712   ACIDBASEDEF 6.0* 06/15/2014 2013   O2SAT 97.0 06/15/2014 2013   CBG (last 3)   Recent Labs  08/06/14 0559 08/06/14 1617 08/06/14 2123  GLUCAP 155* 184* 112*    Assessment/Plan: S/P Procedure(s) (LRB): INSERTION PLEURAL DRAINAGE CATHETER (Left) pleurx this pm   LOS: 3 days    [name],[name] 08/07/2014

## 2014-08-07 NOTE — Transfer of Care (Signed)
Immediate Anesthesia Transfer of Care Note  Patient: Joyce [name]  Procedure(s) Performed: Procedure(s): INSERTION PLEURAL DRAINAGE CATHETER (Left)  Patient Location: PACU  Anesthesia Type:MAC  Level of Consciousness: awake, alert , oriented and patient cooperative  Airway & Oxygen Therapy: Patient Spontanous Breathing and Patient connected to nasal cannula oxygen  Post-op Assessment: Report given to RN, Post -op Vital signs reviewed and stable and Patient moving all extremities  Post vital signs: Reviewed and stable  Last Vitals:  Filed Vitals:   08/07/14 1507  BP: 125/57  Pulse: 84  Temp: 36.7 C  Resp: 14    Complications: No apparent anesthesia complications

## 2014-08-07 NOTE — Anesthesia Postprocedure Evaluation (Signed)
  Anesthesia Post-op Note  Patient: Joyce [name]  Procedure(s) Performed: Procedure(s): INSERTION PLEURAL DRAINAGE CATHETER (Left)  Patient Location: PACU  Anesthesia Type:MAC  Level of Consciousness: awake, alert , oriented and patient cooperative  Airway and Oxygen Therapy: Patient Spontanous Breathing  Post-op Pain: mild  Post-op Assessment: Post-op Vital signs reviewed, Patient's Cardiovascular Status Stable, Respiratory Function Stable, Patent Airway, No signs of Nausea or vomiting and Pain level controlled  Post-op Vital Signs: stable  Last Vitals:  Filed Vitals:   08/07/14 1152  BP: 160/70  Pulse:   Temp:   Resp:     Complications: No apparent anesthesia complications

## 2014-08-07 NOTE — Progress Notes (Signed)
Subjective:    Shortness of breath is slightly worse this morning. Her lower extremity edema remains improved. She denies any chest pain, fevers, or chills.  Interval Events: -Chest x-ray with slight increase in size of left pleural effusion. -Blood pressure elevated after starting prednisone yesterday.   Objective:   Vital Signs:   Temp:  [98.3 F (36.8 C)-98.8 F (37.1 C)] 98.3 F (36.8 C) (02/12 0704) Pulse Rate:  [77-94] 88 (02/12 0704) Resp:  [18-20] 18 (02/12 0704) BP: (135-163)/(55-71) 160/70 mmHg (02/12 1152) SpO2:  [93 %-96 %] 95 % (02/12 1152) Weight:  [187 lb 6.3 oz (85 kg)] 187 lb 6.3 oz (85 kg) (02/12 0704) Last BM Date: 08/06/14  24-hour weight change: Weight change:   Intake/Output:  No intake or output data in the 24 hours ending 08/07/14 1332    Physical Exam: General: Vital signs reviewed and noted. Well-developed, well-nourished, in no acute distress; alert, appropriate and cooperative throughout examination.  Lungs:  Bilateral crackles with decreased breath sounds at bilateral lung bases, stable.   Heart: RRR. S1 and S2 normal without gallop, murmur, or rubs.  Abdomen:  BS normoactive. Soft, Nondistended, non-tender.  No masses or organomegaly.  Extremities: Trace edema bilateral lower extremities, improved.      Labs:  Basic Metabolic Panel:  Recent Labs Lab 08/04/14 0112 08/05/14 0436 08/06/14 0558  NA 137 138 140  K 4.4 4.3 3.9  CL 105 107 110  CO2 27 26 23   GLUCOSE 126* 143* 95  BUN 22 18 8   CREATININE 1.63* 1.42* 1.14*  CALCIUM 8.6 8.6 9.1    Liver Function Tests:  Recent Labs Lab 08/04/14 0112  AST 20  ALT 15  ALKPHOS 53  BILITOT 0.5  PROT 5.3*  ALBUMIN 2.9*   CBC:  Recent Labs Lab 08/04/14 0112  WBC 3.8*  NEUTROABS 2.1  HGB 9.4*  HCT 29.2*  MCV 86.9  PLT 158    CBG:  Recent Labs Lab 08/06/14 0559 08/06/14 1617 08/06/14 2123 08/07/14 0716 08/07/14 1126  GLUCAP 155* 184* 112* 128* 165*     Microbiology: Results for orders placed or performed during the hospital encounter of 07/26/14  MRSA PCR Screening     Status: None   Collection Time: 07/26/14  7:04 PM  Result Value Ref Range Status   MRSA by PCR NEGATIVE NEGATIVE Final    Comment:        The GeneXpert MRSA Assay (FDA approved for NASAL specimens only), is one component of a comprehensive MRSA colonization surveillance program. It is not intended to diagnose MRSA infection nor to guide or monitor treatment for MRSA infections.   Body fluid culture     Status: None   Collection Time: 07/28/14  1:34 PM  Result Value Ref Range Status   Specimen Description FLUID PLEURAL LEFT  Final   Special Requests NONE  Final   Gram Stain   Final    FEW WBC PRESENT, PREDOMINANTLY MONONUCLEAR NO ORGANISMS SEEN Performed at [facility]    Culture   Final    NO GROWTH 3 DAYS Performed at [facility]    Report Status 08/01/2014 FINAL  Final    Coagulation Studies: No results for input(s): LABPROT, INR in the last 72 hours.   Imaging: Dg Chest 2 View  08/07/2014   CLINICAL DATA:  Shortness of breath.  EXAM: CHEST  2 VIEW  COMPARISON:  None.  FINDINGS: Mediastinum hilar structures are normal. Prior CABG. Heart size normal. Bibasilar  atelectasis and/or infiltrates with bilateral effusions. No acute bony abnormality. Prior cervical spine fusion.  IMPRESSION: 1. Bibasilar atelectasis and/or infiltrates with small bilateral effusions. No change from prior exam.  2.  Prior CABG.  Heart size stable.  No pulmonary venous congestion.   Electronically Signed   By: Marcello Moores  Register   On: 08/07/2014 08:07   Dg Chest 2 View  08/06/2014   CLINICAL DATA:  Shortness of breath.  EXAM: CHEST  2 VIEW  COMPARISON:  08/04/2014.  FINDINGS: Mediastinum and hilar structures are normal. Heart size stable. Prior CABG. Bibasilar pulmonary infiltrates and bilateral effusions are noted. Bibasilar infiltrates increased from prior  exam. No pneumothorax. Prior cervical spine fusion.  IMPRESSION: 1. Interim increase in bibasilar pulmonary infiltrates. 2. Persistent bilateral pleural effusions. 3.  Prior CABG.  Heart size stable.  Pulmonary vascularity normal.   Electronically Signed   By: Marcello Moores  Register   On: 08/06/2014 07:33       Medications:    Infusions: . sodium chloride 50 mL/hr at 08/07/14 1331    Scheduled Medications: . [MAR Hold] amLODipine  10 mg Oral Daily  . [MAR Hold] aspirin EC  325 mg Oral Daily  . [MAR Hold] atorvastatin  40 mg Oral q1800  . [MAR Hold] cefUROXime (ZINACEF)  IV  1.5 g Intravenous Once  . enoxaparin (LOVENOX) injection  40 mg Subcutaneous Q24H  . [MAR Hold] ferrous Q000111Q C-folic acid  1 capsule Oral Q breakfast  . [MAR Hold] fluticasone  2 spray Each Nare Daily  . [MAR Hold] furosemide  40 mg Oral Daily  . [MAR Hold] hydrALAZINE  75 mg Oral 3 times per day  . [MAR Hold] insulin aspart  0-9 Units Subcutaneous TID WC  . [MAR Hold] polyethylene glycol  17 g Oral Daily  . [MAR Hold] primidone  50 mg Oral Daily  . [MAR Hold] sodium chloride  1 spray Each Nare TID AC & HS  . [MAR Hold] sodium chloride  3 mL Intravenous Q12H  . [MAR Hold] sodium chloride  3 mL Intravenous Q12H  . [MAR Hold] traZODone  50 mg Oral QHS    PRN Medications: [MAR Hold] sodium chloride, [MAR Hold] acetaminophen, [MAR Hold] ondansetron **OR** [MAR Hold] ondansetron (ZOFRAN) IV, [MAR Hold] sodium chloride, [MAR Hold] traMADol   Assessment/ Plan:    Principal Problem:   Bilateral pleural effusion Active Problems:   Chronic kidney disease (CKD), stage III (moderate)   HTN (hypertension)   Diabetes mellitus type 2 in obese   S/P CABG x 4   Diastolic CHF, chronic  #Post-thoracotomy pleural effusions Breathing potentially slightly worse. It appears that her pleural effusions are slightly worse on x-ray today. Cardiothoracic surgery plans to place a Pleurx catheter this afternoon at 1 PM.  Given her history of diabetes and elevated blood pressure after starting prednisone we'll discontinue today. Spironolactone will likely not improve effusions, so we will discontinue today. -Continue Lasix 40 mg  -Stop spironolactone and prednisone. -Pleurx catheter this afternoon. -We will monitor after catheter placement with likely discharge later this weekend or early next week. -Nothing by mouth for procedure this afternoon. -Supplemental oxygen for comfort when necessary.  -Continue to work with PT.  #Lower extremity edema Slightly improved from prior. -Continue Lasix as above.  #Constipation -MiraLAX daily for prevention.  #CAD s/p CABG -Continue aspirin 325 mg daily. -Continue atorvastatin.  #HTN Blood pressure elevated, likely due to prednisone. -Continue amlodipine 10 mg daily, hydralazine 75 mg TID.  #DM2 Glucose trending  up today. Will hopefully improve after stopping prednisone. -CBGs ACHS. -SSI-S. -Hold home Lantus.  #Rhinitis -Continue Flonase and saline nasal spray.  #Fibromyalgia -Continue primidone and tramadol 50 mg q6h PRN.   DVT PPX - heparin  CODE STATUS - Full  CONSULTS PLACED - Cardiothoracic surgery  DISPO - Possible discharge in 1-3 days.  The patient does have a current PCP Allie Dimmer, MD) and does not need an [facility] hospital follow-up appointment after discharge.    Is the [facility] hospital follow-up appointment a one-time only appointment? N/A.  Does the patient have transportation limitations that hinder transportation to clinic appointments? yes   SERVICE NEEDED AT [place]         Y = Yes, Blank = No PT: Home health  OT: Home health  RN:   Equipment:   Other:      Length of Stay: 3 day(s)   Signed: Arman Filter, MD  PGY-1, Internal Medicine Resident Pager: [phone] (7AM-5PM) 08/07/2014, 1:32 PM

## 2014-08-07 NOTE — Anesthesia Procedure Notes (Signed)
Procedure Name: MAC Date/Time: 08/07/2014 2:23 PM Performed by: Ned Grace Pre-anesthesia Checklist: Patient identified, Timeout performed, Emergency Drugs available, Suction available and Patient being monitored Patient Re-evaluated:Patient Re-evaluated prior to inductionOxygen Delivery Method: Simple face mask Intubation Type: IV induction

## 2014-08-07 NOTE — Progress Notes (Signed)
CARDIAC REHAB PHASE I   PRE:  Rate/Rhythm: 86 SR  BP:  Supine:   Sitting: 160/70  Standing:    SaO2: 93 1L  MODE:  Ambulation: 92 ft   POST:  Rate/Rhythm: 100  BP:  Supine:   Sitting: 160/70  Standing:    SaO2: 93 2L 1130-1200 Assisted X 1 used walker and O2 2L to ambulate. Gait steady with walker. Pt is DOE and tires easily. VS stable Pt back to recliner after walk with call light in reach.  Rodney Langton RN 08/07/2014 12:16 PM

## 2014-08-07 NOTE — Brief Op Note (Signed)
08/04/2014 - 08/07/2014  3:05 PM  PATIENT:  Joyce [name]  74 y.o. female  PRE-OPERATIVE DIAGNOSIS:  LEFT PLEURAL EFFUSION  POST-OPERATIVE DIAGNOSIS:  LEFT PLEURAL EFFUSION  PROCEDURE:  Procedure(s): INSERTION PLEURAL DRAINAGE CATHETER (Left)  Drainage of L pleural effusion 1.0 Liter  SURGEON:  Surgeon(s) and Role:    * Ivin Poot, MD - Primary  PHYSICIAN ASSISTANT: none  ASSISTANTS: none   ANESTHESIA:   local and IV sedation  EBL:  Total I/O In: 250 [I.V.:250] Out: -   BLOOD ADMINISTERED:none  DRAINS:L Pleurx  LOCAL MEDICATIONS USED:  LIDOCAINE  and Amount: 14 ml  SPECIMEN:  No Specimen  DISPOSITION OF SPECIMEN:  N/A  COUNTS:  YES  TOURNIQUET:  * No tourniquets in log *  DICTATION: .Dragon Dictation  PLAN OF CARE: return to  2 [address]  bed 30  PATIENT DISPOSITION:  PACU - hemodynamically stable.   Delay start of Pharmacological VTE agent (>24hrs) due to surgical blood loss or risk of bleeding: yes

## 2014-08-07 NOTE — Anesthesia Preprocedure Evaluation (Signed)
Anesthesia Evaluation  Patient identified by MRN, date of birth, ID band Patient awake    Reviewed: Allergy & Precautions, NPO status , Patient's Chart, lab work & pertinent test results  History of Anesthesia Complications (+) history of anesthetic complications  Airway        Dental   Pulmonary          Cardiovascular hypertension, + angina + Past MI, + CABG and +CHF     Neuro/Psych  Neuromuscular disease    GI/Hepatic   Endo/Other  diabetes  Renal/GU Renal disease     Musculoskeletal  (+) Arthritis -, Fibromyalgia -  Abdominal   Peds  Hematology  (+) anemia ,   Anesthesia Other Findings   Reproductive/Obstetrics                             Anesthesia Physical Anesthesia Plan  ASA: III  Anesthesia Plan: MAC   Post-op Pain Management:    Induction: Intravenous  Airway Management Planned: Mask  Additional Equipment:   Intra-op Plan:   Post-operative Plan:   Informed Consent: I have reviewed the patients History and Physical, chart, labs and discussed the procedure including the risks, benefits and alternatives for the proposed anesthesia with the patient or authorized representative who has indicated his/her understanding and acceptance.     Plan Discussed with: CRNA, Anesthesiologist and Surgeon  Anesthesia Plan Comments:         Anesthesia Quick Evaluation

## 2014-08-07 NOTE — Progress Notes (Signed)
  Date: 08/07/2014  Patient name: Joyce [name]  Medical record number: [id number]  Date of birth: 1941-06-16   This patient has been seen and the plan of care was discussed with the house staff. Please see their note for complete details. I concur with their findings with the following additions/corrections: Pt to get her Pleurx catheter today. Will D/C prednisone and spironolactone.   Bartholomew Crews, MD 08/07/2014, 12:31 PM

## 2014-08-07 NOTE — Care Management Note (Signed)
    Page 1 of 2   08/13/2014     3:53:55 PM CARE MANAGEMENT NOTE 08/13/2014  Patient:  Joyce [name], Joyce [name]   Account Number:  [id number]  Date Initiated:  08/07/2014  Documentation initiated by:  COLE,ANGELA  Subjective/Objective Assessment:   Admitted with pleural effusions     Action/Plan:   PTA home with [facility]  HHPT/OT will need resumation orders with d/c   Anticipated DC Date:  08/13/2014   Anticipated DC Plan:  [place]  CM consult      [facility] Choice  Resumption Of Svcs/PTA Provider   Choice offered to / List presented to:  C-1 Patient   DME arranged  [facility]      DME agency  [facility] arranged  HH-1 RN  [place]      [facility]   Status of service:  Completed, signed off Medicare Important Message given?  YES (If response is "NO", the following Medicare IM given date fields will be blank) Date Medicare IM given:  08/07/2014 Medicare IM given by:  Whitman Hero Date Additional Medicare IM given:  08/13/2014 Additional Medicare IM given by:  Marvetta Gibbons  Discharge Disposition:  [place]  Per UR Regulation:  Reviewed for med. necessity/level of care/duration of stay  If discussed at [place] of Stay Meetings, dates discussed:   08/11/2014  08/13/2014    Comments:  08/13/14- 1200- Marvetta Gibbons RN BSN [phone] Pt for dc/ home today, have clarified [place] orders for PleurX cath drainage and spoken to [facility] with [facility]- to make sure that a HH-RN can come daily for this week and next- week this has been confirmed- new orders faxed to [facility] [place] notified that [facility] arranged- have also spoken with [facility] for 02 delivery- to bring 2 tanks to room prior to discharge - pt to go home with 2 boxes of PleurX cath dranaige kits- forms faxed to [facility]- and originals mailed.    08/11/14- [place] RN, BSN  [phone] Pt will need home 02- qualifying note in epic- order placed- spoke with Jermaine with [facility]- confirmed that they can service pt in [facility], will deliver 2 tanks to room prior to discharge-  plan has changed and pt now will not be discharged to day- needs 2nd pleurX cath placed on other side. plan to place in am. will d/c home with [place]. PleurX cath drains. Have contacted Lexine Baton at [facility]- to inform that pt will not d/c home today and will let them know new start date once plan confirmed after new pleurx placed. Will need new qualifying note for home 02 prior to discharge.   08/10/14- 1400- Johntay Doolen Laurena Bering, BSN [phone] Pt for d/c home in am- plan for pleurX cath drainage- m/w/f- call made to [facility]- spoke with [place]- referral accepted and RN will be out to home on Wed. 2/17- faxed orders and paperwork to [facility] [phone]- also completed paperwork for [employer] and faxed for PleurXcath drainage kits- will mail originals- (copies given to Pt for reference) -

## 2014-08-08 LAB — BASIC METABOLIC PANEL
Anion gap: 5 (ref 5–15)
BUN: 18 mg/dL (ref 6–23)
CO2: 28 mmol/L (ref 19–32)
Calcium: 8.3 mg/dL — ABNORMAL LOW (ref 8.4–10.5)
Chloride: 105 mmol/L (ref 96–112)
Creatinine, Ser: 1.41 mg/dL — ABNORMAL HIGH (ref 0.50–1.10)
GFR calc Af Amer: 42 mL/min — ABNORMAL LOW (ref >90)
GFR, EST NON AFRICAN AMERICAN: 37 mL/min — AB (ref >90)
Glucose, Bld: 130 mg/dL — ABNORMAL HIGH (ref 70–99)
Potassium: 4.2 mmol/L (ref 3.5–5.1)
SODIUM: 138 mmol/L (ref 135–145)

## 2014-08-08 LAB — CBC
HCT: 28 % — ABNORMAL LOW (ref 36.0–46.0)
Hemoglobin: 9.1 g/dL — ABNORMAL LOW (ref 12.0–15.0)
MCH: 28.3 pg (ref 26.0–34.0)
MCHC: 32.5 g/dL (ref 30.0–36.0)
MCV: 87 fL (ref 78.0–100.0)
Platelets: 144 10*3/uL — ABNORMAL LOW (ref 150–400)
RBC: 3.22 MIL/uL — ABNORMAL LOW (ref 3.87–5.11)
RDW: 14.1 % (ref 11.5–15.5)
WBC: 3.4 10*3/uL — ABNORMAL LOW (ref 4.0–10.5)

## 2014-08-08 LAB — GLUCOSE, CAPILLARY
GLUCOSE-CAPILLARY: 108 mg/dL — AB (ref 70–99)
Glucose-Capillary: 134 mg/dL — ABNORMAL HIGH (ref 70–99)
Glucose-Capillary: 149 mg/dL — ABNORMAL HIGH (ref 70–99)
Glucose-Capillary: 131 mg/dL — ABNORMAL HIGH (ref 70–99)
Glucose-Capillary: 96 mg/dL (ref 70–99)

## 2014-08-08 MED ORDER — TRAMADOL HCL 50 MG PO TABS
50.0000 mg | ORAL_TABLET | ORAL | Status: DC
  Administered 2014-08-08 – 2014-08-13 (×26): 50 mg via ORAL
  Filled 2014-08-08 (×27): qty 1

## 2014-08-08 MED ORDER — AMOXICILLIN-POT CLAVULANATE 875-125 MG PO TABS
2.0000 | ORAL_TABLET | Freq: Two times a day (BID) | ORAL | Status: DC
  Filled 2014-08-08 (×3): qty 2

## 2014-08-08 MED ORDER — AMOXICILLIN-POT CLAVULANATE 875-125 MG PO TABS
1.0000 | ORAL_TABLET | Freq: Two times a day (BID) | ORAL | Status: DC
  Administered 2014-08-08 – 2014-08-13 (×10): 1 via ORAL
  Filled 2014-08-08 (×12): qty 1

## 2014-08-08 NOTE — Progress Notes (Signed)
Subjective:    She reports improvement of her breathing since placement of Pleurx catheter yesterday. She's been having some increased pain at the site of insertion, and she is asking for her tramadol to be increased to every 4 hours as needed. She also reports increased drainage from her nose that has become increasingly purulent. She does report some sinus fullness and tenderness. She is requesting going home tomorrow so she can avoid the snowstorm.  Interval Events: -Left Pleurx catheter placed by cardiothoracic surgery yesterday with drainage of 1 L of fluid. No evidence of pneumothorax on post procedure x-ray. -Blood pressure improving after stopping prednisone. -Creatinine back up to 1.4, which appears to be her baseline.   Objective:   Vital Signs:   Temp:  [97.8 F (36.6 C)-98.4 F (36.9 C)] 97.8 F (36.6 C) (02/13 0527) Pulse Rate:  [71-91] 88 (02/13 0527) Resp:  [14-25] 18 (02/13 0527) BP: (125-160)/(54-70) 147/61 mmHg (02/13 0527) SpO2:  [91 %-99 %] 99 % (02/13 0527) Weight:  [185 lb 3 oz (84 kg)] 185 lb 3 oz (84 kg) (02/13 0527) Last BM Date: 08/07/14  24-hour weight change: Weight change:   Intake/Output:   Intake/Output Summary (Last 24 hours) at 08/08/14 1130 Last data filed at 08/08/14 0900  Gross per 24 hour  Intake    590 ml  Output      0 ml  Net    590 ml      Physical Exam: General: Vital signs reviewed and noted. Well-developed, well-nourished, in no acute distress; alert, appropriate and cooperative throughout examination.  Lungs:  Bilateral crackles. Improved breath sounds at left lung base. Left Pleurx catheter in place and dressed.   Heart: RRR. S1 and S2 normal without gallop, murmur, or rubs.  Abdomen:  BS normoactive. Soft, Nondistended, non-tender.  No masses or organomegaly.  Extremities: Trace edema bilateral lower extremities.     Labs:  Basic Metabolic Panel:  Recent Labs Lab 08/04/14 0112 08/05/14 0436 08/06/14 0558  08/08/14 0440  NA 137 138 140 138  K 4.4 4.3 3.9 4.2  CL 105 107 110 105  CO2 27 26 23 28   GLUCOSE 126* 143* 95 130*  BUN 22 18 8 18   CREATININE 1.63* 1.42* 1.14* 1.41*  CALCIUM 8.6 8.6 9.1 8.3*    Liver Function Tests:  Recent Labs Lab 08/04/14 0112  AST 20  ALT 15  ALKPHOS 53  BILITOT 0.5  PROT 5.3*  ALBUMIN 2.9*   CBC:  Recent Labs Lab 08/04/14 0112 08/08/14 0440  WBC 3.8* 3.4*  NEUTROABS 2.1  --   HGB 9.4* 9.1*  HCT 29.2* 28.0*  MCV 86.9 87.0  PLT 158 144*    CBG:  Recent Labs Lab 08/07/14 1624 08/07/14 2123 08/08/14 0626 08/08/14 0807 08/08/14 1119  GLUCAP 115* 126* 134* 149* 131*    Microbiology: Results for orders placed or performed during the hospital encounter of 07/26/14  MRSA PCR Screening     Status: None   Collection Time: 07/26/14  7:04 PM  Result Value Ref Range Status   MRSA by PCR NEGATIVE NEGATIVE Final    Comment:        The GeneXpert MRSA Assay (FDA approved for NASAL specimens only), is one component of a comprehensive MRSA colonization surveillance program. It is not intended to diagnose MRSA infection nor to guide or monitor treatment for MRSA infections.   Body fluid culture     Status: None   Collection Time: 07/28/14  1:34 PM  Result Value Ref Range Status   Specimen Description FLUID PLEURAL LEFT  Final   Special Requests NONE  Final   Gram Stain   Final    FEW WBC PRESENT, PREDOMINANTLY MONONUCLEAR NO ORGANISMS SEEN Performed at [facility]    Culture   Final    NO GROWTH 3 DAYS Performed at [facility]    Report Status 08/01/2014 FINAL  Final    Coagulation Studies: No results for input(s): LABPROT, INR in the last 72 hours.   Imaging: Dg Chest 2 View  08/07/2014   CLINICAL DATA:  Shortness of breath.  EXAM: CHEST  2 VIEW  COMPARISON:  None.  FINDINGS: Mediastinum hilar structures are normal. Prior CABG. Heart size normal. Bibasilar atelectasis and/or infiltrates with bilateral  effusions. No acute bony abnormality. Prior cervical spine fusion.  IMPRESSION: 1. Bibasilar atelectasis and/or infiltrates with small bilateral effusions. No change from prior exam.  2.  Prior CABG.  Heart size stable.  No pulmonary venous congestion.   Electronically Signed   By: Marcello Moores  Register   On: 08/07/2014 08:07   Dg Chest Port 1 View  08/07/2014   CLINICAL DATA:  Postop for left pleural catheter placement.  EXAM: PORTABLE CHEST - 1 VIEW; DG C-ARM 1-60 MIN - NRPT [facility]  COMPARISON:  06/07/2015  FINDINGS: There is a radiopaque pleural drain at the left chest base. Improved aeration at the left lung base with residual atelectasis. There continues to be right basilar densities compatible with a moderate right pleural effusion and associated atelectasis. There is no evidence for a pneumothorax. Heart size remains upper limits of normal. Surgical changes compatible with cervical spine surgery and CABG procedure.  IMPRESSION: Placement of a left pleural drain with improved aeration at the left lung base. No evidence for pneumothorax.  Right basilar densities are compatible with right pleural fluid and atelectasis.   Electronically Signed   By: Markus Daft M.D.   On: 08/07/2014 15:38   Dg C-arm 1-60 Min-no Report  08/07/2014   CLINICAL DATA: intraop   C-ARM 1-60 MINUTES  Fluoroscopy was utilized by the requesting physician.  No radiographic  interpretation.        Medications:    Infusions: . sodium chloride 50 mL/hr at 08/08/14 1026    Scheduled Medications: . amLODipine  10 mg Oral Daily  . amoxicillin-clavulanate  2 tablet Oral BID WC  . aspirin EC  325 mg Oral Daily  . atorvastatin  40 mg Oral q1800  . ferrous Q000111Q C-folic acid  1 capsule Oral Q breakfast  . fluticasone  2 spray Each Nare Daily  . furosemide  40 mg Oral Daily  . hydrALAZINE  75 mg Oral 3 times per day  . insulin aspart  0-9 Units Subcutaneous TID WC  . polyethylene glycol  17 g Oral Daily  . primidone   50 mg Oral Daily  . sodium chloride  1 spray Each Nare TID AC & HS  . sodium chloride  3 mL Intravenous Q12H  . sodium chloride  3 mL Intravenous Q12H  . traMADol  50 mg Oral Q4H  . traZODone  50 mg Oral QHS    PRN Medications: sodium chloride, acetaminophen, ondansetron **OR** ondansetron (ZOFRAN) IV, sodium chloride   Assessment/ Plan:    Active Problems:   Chronic kidney disease (CKD), stage III (moderate)   HTN (hypertension)   Diabetes mellitus type 2 in obese   S/P CABG x 4   Diastolic CHF, chronic  Exudative pleural effusion  #Post-thoracotomy pleural effusions Breathing improved since placement of Pleurx catheter. She has pain at site of the catheter insertion that responds to her tramadol. Cardiothoracic surgery PA recommending daily drainage of Pleurx catheter while in hospital with Monday Wednesday Friday drainage as outpatient. -Drain Pleurx catheter daily. -Continue Lasix 40 mg  -Supplemental oxygen for comfort when necessary.  -Continue to work with PT. -Arrange home health RN for Monday Wednesday Friday drainage of catheter as outpatient. -Heart healthy/carb modified diet.  #Acute bacterial rhinosinusitis Started approximately a week ago, and has been increasing in amount and purulence during hospitalization. Tenderness to palpation of sinuses. Suggestive of bacterial rhinosinusitis. She has not responded to Flonase nasal spray, so we'll start antibiotics given her comorbidities. -Augmentin 875-125 milligrams, 2 tablets twice a day for 5 days. -Continue Flonase and saline nasal spray.  #Lower extremity edema Stable. -Continue Lasix as above.  #Constipation -MiraLAX daily for prevention.  #CAD s/p CABG -Continue aspirin 325 mg daily. -Continue atorvastatin.  #HTN Blood pressure improved since stopping prednisone. -Continue amlodipine 10 mg daily, hydralazine 75 mg TID.  #DM2 Glucose improved since stopping prednisone. -CBGs ACHS. -SSI-S. -Hold  home Lantus.  #Fibromyalgia -Continue primidone and tramadol 50 mg q6h PRN.   DVT PPX - heparin  CODE STATUS - Full  CONSULTS PLACED - Cardiothoracic surgery  DISPO - Possible discharge in 1-3 days.  The patient does have a current PCP Allie Dimmer, MD) and does not need an [facility] hospital follow-up appointment after discharge.    Is the [facility] hospital follow-up appointment a one-time only appointment? N/A.  Does the patient have transportation limitations that hinder transportation to clinic appointments? yes   SERVICE NEEDED AT [place]         Y = Yes, Blank = No PT: Home health  OT: Home health  RN:   Equipment:   Other:      Length of Stay: 4 day(s)   Signed: Arman Filter, MD  PGY-1, Internal Medicine Resident Pager: [phone] (7AM-5PM) 08/08/2014, 11:30 AM

## 2014-08-08 NOTE — Progress Notes (Signed)
      [address]       [place] [address]             [phone]       1 Day Post-Op Procedure(s) (LRB): INSERTION PLEURAL DRAINAGE CATHETER (Left)  Subjective: Patient states breathing is better.  Objective: Vital signs in last 24 hours: Temp:  [97.8 F (36.6 C)-98.4 F (36.9 C)] 97.8 F (36.6 C) (02/13 0527) Pulse Rate:  [71-91] 88 (02/13 0527) Cardiac Rhythm:  [-] Normal sinus rhythm (02/12 1607) Resp:  [14-25] 18 (02/13 0527) BP: (125-160)/(54-70) 147/61 mmHg (02/13 0527) SpO2:  [91 %-99 %] 99 % (02/13 0527) Weight:  [185 lb 3 oz (84 kg)] 185 lb 3 oz (84 kg) (02/13 0527)     Intake/Output from previous day: 02/12 0701 - 02/13 0700 In: 350 [I.V.:350] Out: -    Physical Exam:  Cardiovascular: RRR Pulmonary: Clear to auscultation on right and slightly diminished left base Abdomen: Soft, non tender, bowel sounds present. Wounds: Dressing is lean and dry.     Lab Results: CBC: Recent Labs  08/08/14 0440  WBC 3.4*  HGB 9.1*  HCT 28.0*  PLT 144*   BMET:  Recent Labs  08/06/14 0558 08/08/14 0440  NA 140 138  K 3.9 4.2  CL 110 105  CO2 23 28  GLUCOSE 95 130*  BUN 8 18  CREATININE 1.14* 1.41*  CALCIUM 9.1 8.3*    PT/INR: No results for input(s): LABPROT, INR in the last 72 hours. ABG:  INR: Will add last result for INR, ABG once components are confirmed Will add last 4 CBG results once components are confirmed  Assessment/Plan:  1. CV - SR in the 70's 2.  Pulmonary - s/p left Pleur X for recurrent pleural effusion. Left pleur x to be drained daily while in hospital. Needs [facility] nurse to drain Mon/Wed/Fri after discharge.   ZIMMERMAN,DONIELLE MPA-C 08/08/2014,8:56 AM

## 2014-08-08 NOTE — Progress Notes (Signed)
Pleurx drained per MD order and protocol. 650cc drained. New dressing applied. Will continue to monitor.

## 2014-08-08 NOTE — Op Note (Signed)
Joyce [name], Joyce [name] NO.:  [id number]  MEDICAL RECORD NO.:  [id number]  LOCATION:  [id number]                        FACILITY:  [facility]  PHYSICIAN:  Ivin Poot, M.D.  DATE OF BIRTH:  08/10/1940  DATE OF PROCEDURE:  08/07/2014 DATE OF DISCHARGE:                              OPERATIVE REPORT   OPERATION:  Placement of left PleurX catheter.  PREOPERATIVE DIAGNOSIS:  Recurrent non malignant left pleural effusion.  POSTOPERATIVE DIAGNOSIS:  Recurrent non malignant left pleural effusion.  SURGEON:  Ivin Poot, MD  ANESTHESIA:  IV conscious sedation monitored by anesthesia team and local 1% lidocaine.  INDICATIONS:  The patient is a 74 year old hypertensive female with chronic renal insufficiency, who underwent multivessel CABG in late December.  She has had some problems with recurrent left pleural effusion which has required at least 2-3 thoracenteses procedures.  She was recently hospitalized for shortness of breath and found to have a pleural effusion which did not respond to diuretic therapy.  I discussed PleurX catheter with the patient for symptomatic control of the pleural effusions and she agreed to proceed with the procedure.  We discussed the risks of infection, bleeding, pneumothorax, and recurrent pleural effusion.  She understood that she would be discharged with a catheter for home care and home drainage sessions.  OPERATIVE PROCEDURE:  The patient was brought to the operating room and placed supine on the operating table.  IV conscious sedation was administered, and the patient was monitored hemodynamically by the anesthesia team.  Left chest was prepped and draped as a sterile field. A proper time-out was performed.  Local 1% lidocaine was infiltrated at the left costal margin at the midclavicular line in the fifth interspace at the anterior axillary line.  A small finder needle was used to aspirate pleural fluid in the upper incision which  had been anesthetized.  Next, a needle sheath system was inserted in the left pleural space and a guidewire passed and the needle removed.  The needle was confirmed to be in the lower portion of the left pleural space by C- arm fluoroscopy.  Next, an incision was made in the lower costal area which had been anesthetized and the PleurX catheter was tunneled from the lower to the upper incision.  Next, over the guidewire, the dilator and the dilator-sheath system was inserted and the guidewire was removed.  Through the sheath which drained a significant clear pleural fluid  the PleurX catheter was inserted and advanced as the tearaway sheath was removed.  Catheter was then connected to a vacuum bottle and 1 L of fluid drained.  C-arm fluoroscopy documented the left PleurX catheter to be in the dependent portion of the left pleural space.  The upper incision was closed with subcutaneous Vicryl sutures and to skin nylon sutures.  The lower incision was closed with a silk suture which was also used to secure the catheter to the skin.  Sterile dressing was applied.  The patient returned to recovery room for chest x- ray and observation period.  There were no complications noted.     Ivin Poot, M.D.     PV/MEDQ  D:  08/07/2014  T:  08/08/2014  Job:  [id number]

## 2014-08-09 LAB — GLUCOSE, CAPILLARY
GLUCOSE-CAPILLARY: 130 mg/dL — AB (ref 70–99)
Glucose-Capillary: 124 mg/dL — ABNORMAL HIGH (ref 70–99)
Glucose-Capillary: 150 mg/dL — ABNORMAL HIGH (ref 70–99)
Glucose-Capillary: 115 mg/dL — ABNORMAL HIGH (ref 70–99)

## 2014-08-09 LAB — BASIC METABOLIC PANEL
Anion gap: 7 (ref 5–15)
BUN: 16 mg/dL (ref 6–23)
CALCIUM: 8.4 mg/dL (ref 8.4–10.5)
CHLORIDE: 104 mmol/L (ref 96–112)
CO2: 26 mmol/L (ref 19–32)
Creatinine, Ser: 1.4 mg/dL — ABNORMAL HIGH (ref 0.50–1.10)
GFR calc Af Amer: 43 mL/min — ABNORMAL LOW (ref >90)
GFR calc non Af Amer: 37 mL/min — ABNORMAL LOW (ref >90)
Glucose, Bld: 129 mg/dL — ABNORMAL HIGH (ref 70–99)
Potassium: 4.4 mmol/L (ref 3.5–5.1)
Sodium: 137 mmol/L (ref 135–145)

## 2014-08-09 NOTE — Progress Notes (Signed)
Pleurx drained per MD order and protocol. 650cc drained. New dressing applied. Will continue to monitor.

## 2014-08-09 NOTE — Progress Notes (Signed)
Subjective:  Pt seen and examined in AM. No acute events overnight. Yesterday she had  650 cc drained from her pleurx and had new dressing applied. She has mild pain at chest tube site. She reports improved dyspnea but still is mildly dyspneic with activity. She has improved left sided maxillary sinus tenderness and purulent drainage after starting augmentin yesterday for acute sinusitis and denies fever or chills.      Objective: Vital signs in last 24 hours: Filed Vitals:   08/08/14 0527 08/08/14 1409 08/08/14 2019 08/09/14 0624  BP: 147/61 140/60 143/70 129/59  Pulse: 88 84 78 81  Temp: 97.8 F (36.6 C) 98.3 F (36.8 C) 98.3 F (36.8 C) 97.8 F (36.6 C)  TempSrc: Oral Oral Oral Oral  Resp: 18 17 18 18   Height:      Weight: 185 lb 3 oz (84 kg)   183 lb 11.2 oz (83.326 kg)  SpO2: 99% 98% 98% 98%   Weight change: -3 lb 11.1 oz (-1.674 kg)  Intake/Output Summary (Last 24 hours) at 08/09/14 1152 Last data filed at 08/09/14 0825  Gross per 24 hour  Intake    720 ml  Output    650 ml  Net     70 ml   PHYSICAL EXAMINATION: General: Alert, well-developed, and cooperative to examination.  HEENT : Mild left sided maxillary sinus tenderness.  Lungs: Bilateral crackles at bases. Improved breath sounds. Left pleurx catheter in place and dressed.  Heart: Normal rate, regular rhythm, no murmur, no gallop, and no rub.  Abdomen: Soft, non-tender, normal bowel sounds, no distention, no guarding, no rebound tenderness  Extremities: Trace b/l LE edema   Lab Results: Basic Metabolic Panel:  Recent Labs Lab 08/08/14 0440 08/09/14 0434  NA 138 137  K 4.2 4.4  CL 105 104  CO2 28 26  GLUCOSE 130* 129*  BUN 18 16  CREATININE 1.41* 1.40*  CALCIUM 8.3* 8.4   Liver Function Tests:  Recent Labs Lab 08/04/14 0112  AST 20  ALT 15  ALKPHOS 53  BILITOT 0.5  PROT 5.3*  ALBUMIN 2.9*   CBC:  Recent Labs Lab 08/04/14 0112 08/08/14 0440  WBC 3.8* 3.4*  NEUTROABS 2.1  --     HGB 9.4* 9.1*  HCT 29.2* 28.0*  MCV 86.9 87.0  PLT 158 144*   Cardiac Enzymes:  Recent Labs Lab 08/04/14 0112  TROPONINI <0.03   CBG:  Recent Labs Lab 08/08/14 0807 08/08/14 1119 08/08/14 1620 08/08/14 2127 08/09/14 0621 08/09/14 1113  GLUCAP 149* 131* 96 108* 130* 124*   Coagulation:  Recent Labs Lab 08/04/14 0430  LABPROT 13.8  INR 1.05   Studies/Results: Dg Chest Port 1 View  08/07/2014   CLINICAL DATA:  Postop for left pleural catheter placement.  EXAM: PORTABLE CHEST - 1 VIEW; DG C-ARM 1-60 MIN - NRPT [facility]  COMPARISON:  06/07/2015  FINDINGS: There is a radiopaque pleural drain at the left chest base. Improved aeration at the left lung base with residual atelectasis. There continues to be right basilar densities compatible with a moderate right pleural effusion and associated atelectasis. There is no evidence for a pneumothorax. Heart size remains upper limits of normal. Surgical changes compatible with cervical spine surgery and CABG procedure.  IMPRESSION: Placement of a left pleural drain with improved aeration at the left lung base. No evidence for pneumothorax.  Right basilar densities are compatible with right pleural fluid and atelectasis.   Electronically Signed   By: Quita Skye  Anselm Pancoast M.D.   On: 08/07/2014 15:38   Dg C-arm 1-60 Min-no Report  08/07/2014   CLINICAL DATA: intraop   C-ARM 1-60 MINUTES  Fluoroscopy was utilized by the requesting physician.  No radiographic  interpretation.    Medications: I have reviewed the patient's current medications. Scheduled Meds: . amLODipine  10 mg Oral Daily  . amoxicillin-clavulanate  1 tablet Oral BID WC  . aspirin EC  325 mg Oral Daily  . atorvastatin  40 mg Oral q1800  . ferrous Q000111Q C-folic acid  1 capsule Oral Q breakfast  . fluticasone  2 spray Each Nare Daily  . furosemide  40 mg Oral Daily  . hydrALAZINE  75 mg Oral 3 times per day  . insulin aspart  0-9 Units Subcutaneous TID WC  . polyethylene  glycol  17 g Oral Daily  . primidone  50 mg Oral Daily  . sodium chloride  1 spray Each Nare TID AC & HS  . sodium chloride  3 mL Intravenous Q12H  . sodium chloride  3 mL Intravenous Q12H  . traMADol  50 mg Oral Q4H  . traZODone  50 mg Oral QHS   Continuous Infusions: . sodium chloride 50 mL/hr at 08/09/14 0248   PRN Meds:.sodium chloride, acetaminophen, ondansetron **OR** ondansetron (ZOFRAN) IV, sodium chloride Assessment/Plan: Active Problems:   Chronic kidney disease (CKD), stage III (moderate)   HTN (hypertension)   Diabetes mellitus type 2 in obese   S/P CABG x 4   Diastolic CHF, chronic   Exudative pleural effusion  #Non-specific late post-pericardotomy exudative pleural effusions  - 650cc drained from pleurx yesterday. Breathing improved since placement of Pleurx catheter. She has pain at site of the catheter insertion that responds to her tramadol. Cardiothoracic surgery PA recommending daily drainage of Pleurx catheter while in hospital with Monday Wednesday Friday drainage as outpatient. -Drain Pleurx catheter daily. -Continue PO Lasix 40 mg daily  -Supplemental oxygen for comfort when necessary.  -Continue to work with PT. -Arrange home health RN for Monday Wednesday Friday drainage of catheter as outpatient. -Heart healthy/carb modified diet. -Awaiting cardiothoracic surgery recommendations for discharge   #Acute bacterial rhinosinusitis Started approximately a week ago, improved left maxillary sinus tenderness and purulent drainage since starting Augmentin yesterday. No fever or chills.  -Augmentin 875-125 milligrams, 1 tablet twice a day Day 2/5. -Continue Flonase and saline nasal spray.  # CKD Stage 3 - Cr stable at 1.4, below baseline 1.6  -Continue lasix as above  -Avoid nephrotoxins -Monitor daily weight and strict I & O's -Monitor BMP  #Lower extremity edema Stable. -Continue Lasix as above.  #Constipation -MiraLAX daily for prevention.  #CAD s/p  CABG -Continue aspirin 325 mg daily. -Continue atorvastatin.  #HTN - BP 141/65 Blood pressure improved since stopping prednisone. -Continue amlodipine 10 mg daily, hydralazine 75 mg TID.  #DM2 - CBG 124 Glucose improved since stopping prednisone. -CBGs ACHS. -SSI-S. -Hold home Lantus.  #Fibromyalgia -Continue primidone and tramadol 50 mg q6h PRN.    Dispo: Disposition is deferred at this time, awaiting improvement of current medical problems.  Anticipated discharge in approximately 1 day(s).   The patient does have a current PCP Allie Dimmer, MD) and does not need an [facility] hospital follow-up appointment after discharge.  The patient does not have transportation limitations that hinder transportation to clinic appointments.  .Services Needed at time of discharge: Y = Yes, Blank = No PT:   OT:   RN:   Equipment:   Other:  LOS: 5 days   Juluis Mire, MD 08/09/2014, 11:52 AM

## 2014-08-10 ENCOUNTER — Encounter (HOSPITAL_COMMUNITY): Payer: Self-pay | Admitting: Cardiothoracic Surgery

## 2014-08-10 LAB — GLUCOSE, CAPILLARY
GLUCOSE-CAPILLARY: 134 mg/dL — AB (ref 70–99)
GLUCOSE-CAPILLARY: 132 mg/dL — AB (ref 70–99)
Glucose-Capillary: 127 mg/dL — ABNORMAL HIGH (ref 70–99)
Glucose-Capillary: 125 mg/dL — ABNORMAL HIGH (ref 70–99)

## 2014-08-10 MED ORDER — HYDROCODONE-ACETAMINOPHEN 5-325 MG PO TABS
1.0000 | ORAL_TABLET | Freq: Once | ORAL | Status: AC
  Administered 2014-08-10: 1 via ORAL
  Filled 2014-08-10: qty 1

## 2014-08-10 NOTE — Progress Notes (Signed)
Medicare Important Message given? YES  (If response is "NO", the following Medicare IM given date fields will be blank)  Date Medicare IM given: 08/10/14 Medicare IM given by:  Mckensie Scotti  

## 2014-08-10 NOTE — Progress Notes (Addendum)
Draied Pleurx catheter as ordered. Patient tolerated fair, lots of pain nearing 550 ml of output and needing to stop draining catheter.dressing reapplied 576ml output recorded. Will monitor patietn. Tatiyana Foucher, Bettina Gavia RN

## 2014-08-10 NOTE — Progress Notes (Signed)
       [address]       [place] [address]             [phone]          3 Days Post-Op Procedure(s) (LRB): INSERTION PLEURAL DRAINAGE CATHETER (Left)  Subjective: Comfortable, breathing stable.   Objective: Vital signs in last 24 hours: Patient Vitals for the past 24 hrs:  BP Temp Temp src Pulse Resp SpO2 Weight  08/10/14 0500 - - - - - - 185 lb 1.6 oz (83.961 kg)  08/10/14 0454 (!) 129/58 mmHg 98.3 F (36.8 C) Oral 79 18 99 % -  08/09/14 2000 (!) 130/56 mmHg 98.8 F (37.1 C) Oral 78 18 100 % -  08/09/14 1347 (!) 141/65 mmHg 98.4 F (36.9 C) Oral 87 18 96 % -   Current Weight  08/10/14 185 lb 1.6 oz (83.961 kg)     Intake/Output from previous day: 02/14 0701 - 02/15 0700 In: 960 [P.O.:960] Out: 650 [Chest Tube:650]    PHYSICAL EXAM:  Heart: RRR Lungs: Decreased BS L base, few crackles on R Wound: Clean and dry Extremities: No significant LE edema    Lab Results: CBC: Recent Labs  08/08/14 0440  WBC 3.4*  HGB 9.1*  HCT 28.0*  PLT 144*   BMET:  Recent Labs  08/08/14 0440 08/09/14 0434  NA 138 137  K 4.2 4.4  CL 105 104  CO2 28 26  GLUCOSE 130* 129*  BUN 18 16  CREATININE 1.41* 1.40*  CALCIUM 8.3* 8.4    PT/INR: No results for input(s): LABPROT, INR in the last 72 hours.    Assessment/Plan: S/P Procedure(s) (LRB): INSERTION PLEURAL DRAINAGE CATHETER (Left) Stable from surgery standpoint. PleuRx put out 650 ml yesterday.  Continue daily drainage while in the hospital, will decrease to M-W-F at discharge. OK for d/c home from our standpoint when ok with medical service.   LOS: 6 days    Joyce [name] H 08/10/2014

## 2014-08-10 NOTE — Progress Notes (Signed)
IVF NSL as ordered. Emireth Cockerham, Bettina Gavia RN

## 2014-08-10 NOTE — Progress Notes (Signed)
CARE MANAGEMENT NOTE 08/10/2014  Patient:  Joyce [name], Joyce [name]   Account Number:  [id number]  Date Initiated:  08/07/2014  Documentation initiated by:  COLE,ANGELA  Subjective/Objective Assessment:   Admitted with pleural effusions     Action/Plan:   PTA home with [facility]  HHPT/OT will need resumation orders with d/c   Anticipated DC Date:  08/11/2014   Anticipated DC Plan:  [place]  CM consult      [facility] Choice  Resumption Of Svcs/PTA Provider   Choice offered to / List presented to:  C-1 Patient        [place] arranged  HH-1 RN  [facility]   Status of service:  In process, will continue to follow Medicare Important Message given?  YES (If response is "NO", the following Medicare IM given date fields will be blank) Date Medicare IM given:  08/07/2014 Medicare IM given by:  Whitman Hero Date Additional Medicare IM given:  08/10/2014 Additional Medicare IM given by:  Marvetta Gibbons  Discharge Disposition:  [place]  Per UR Regulation:  Reviewed for med. necessity/level of care/duration of stay  If discussed at [place] of Stay Meetings, dates discussed:   08/11/2014    Comments:  08/10/14- 1400- Joyce [name], BSN [phone] Pt for d/c home in am- plan for pleurX cath drainage- m/w/f- call made to [facility]- spoke with [facility]- referral accepted and RN will be out to home on Wed. 2/17- faxed orders and paperwork to [facility] [phone]- also completed paperwork for [employer] and faxed for PleurXcath drainage kits- will mail originals- (copies given to Pt for reference) -

## 2014-08-10 NOTE — Progress Notes (Addendum)
Subjective:    Her breathing continues to improve with drainage of fluid from her Pleurx catheter. She does have some discomfort at the site of the catheter. Her nasal discharge is resolving.  Interval Events: -Pleurx catheter put out 650 mg yesterday. -Cardiothoracic surgery okay with discharge today.   Objective:   Vital Signs:   Temp:  [98.3 F (36.8 C)-98.8 F (37.1 C)] 98.3 F (36.8 C) (02/15 0454) Pulse Rate:  [78-87] 79 (02/15 0454) Resp:  [18] 18 (02/15 0454) BP: (129-141)/(56-65) 129/58 mmHg (02/15 0454) SpO2:  [96 %-100 %] 99 % (02/15 0454) Weight:  [185 lb 1.6 oz (83.961 kg)] 185 lb 1.6 oz (83.961 kg) (02/15 0500) Last BM Date: 08/09/14  24-hour weight change: Weight change: 1 lb 6.4 oz (0.635 kg)  Intake/Output:   Intake/Output Summary (Last 24 hours) at 08/10/14 0953 Last data filed at 08/09/14 1800  Gross per 24 hour  Intake    720 ml  Output    650 ml  Net     70 ml      Physical Exam: General: Vital signs reviewed and noted. Well-developed, well-nourished, in no acute distress; alert, appropriate and cooperative throughout examination.  Lungs:  Left Pleurx catheter in place and dressed. Bilateral crackles continue to be present with improved breath sounds bilaterally.   Heart: RRR. S1 and S2 normal without gallop, murmur, or rubs.  Abdomen:  BS normoactive. Soft, Nondistended, non-tender.  No masses or organomegaly.  Extremities: Trace edema bilateral lower extremities.     Labs:  Basic Metabolic Panel:  Recent Labs Lab 08/04/14 0112 08/05/14 0436 08/06/14 0558 08/08/14 0440 08/09/14 0434  NA 137 138 140 138 137  K 4.4 4.3 3.9 4.2 4.4  CL 105 107 110 105 104  CO2 27 26 23 28 26   GLUCOSE 126* 143* 95 130* 129*  BUN 22 18 8 18 16   CREATININE 1.63* 1.42* 1.14* 1.41* 1.40*  CALCIUM 8.6 8.6 9.1 8.3* 8.4    Liver Function Tests:  Recent Labs Lab 08/04/14 0112  AST 20  ALT 15  ALKPHOS 53  BILITOT 0.5  PROT 5.3*  ALBUMIN 2.9*    CBC:  Recent Labs Lab 08/04/14 0112 08/08/14 0440  WBC 3.8* 3.4*  NEUTROABS 2.1  --   HGB 9.4* 9.1*  HCT 29.2* 28.0*  MCV 86.9 87.0  PLT 158 144*    CBG:  Recent Labs Lab 08/09/14 0621 08/09/14 1113 08/09/14 1623 08/09/14 2114 08/10/14 0638  GLUCAP 130* 124* 150* 115* 134*    Microbiology: Results for orders placed or performed during the hospital encounter of 07/26/14  MRSA PCR Screening     Status: None   Collection Time: 07/26/14  7:04 PM  Result Value Ref Range Status   MRSA by PCR NEGATIVE NEGATIVE Final    Comment:        The GeneXpert MRSA Assay (FDA approved for NASAL specimens only), is one component of a comprehensive MRSA colonization surveillance program. It is not intended to diagnose MRSA infection nor to guide or monitor treatment for MRSA infections.   Body fluid culture     Status: None   Collection Time: 07/28/14  1:34 PM  Result Value Ref Range Status   Specimen Description FLUID PLEURAL LEFT  Final   Special Requests NONE  Final   Gram Stain   Final    FEW WBC PRESENT, PREDOMINANTLY MONONUCLEAR NO ORGANISMS SEEN Performed at [facility]    Culture   Final  NO GROWTH 3 DAYS Performed at [facility]    Report Status 08/01/2014 FINAL  Final    Coagulation Studies: No results for input(s): LABPROT, INR in the last 72 hours.   Imaging: No results found.     Medications:    Infusions: . sodium chloride 50 mL/hr at 08/09/14 2120    Scheduled Medications: . amLODipine  10 mg Oral Daily  . amoxicillin-clavulanate  1 tablet Oral BID WC  . aspirin EC  325 mg Oral Daily  . atorvastatin  40 mg Oral q1800  . ferrous Q000111Q C-folic acid  1 capsule Oral Q breakfast  . fluticasone  2 spray Each Nare Daily  . furosemide  40 mg Oral Daily  . hydrALAZINE  75 mg Oral 3 times per day  . HYDROcodone-acetaminophen  1 tablet Oral Once  . insulin aspart  0-9 Units Subcutaneous TID WC  . polyethylene  glycol  17 g Oral Daily  . primidone  50 mg Oral Daily  . sodium chloride  1 spray Each Nare TID AC & HS  . sodium chloride  3 mL Intravenous Q12H  . sodium chloride  3 mL Intravenous Q12H  . traMADol  50 mg Oral Q4H  . traZODone  50 mg Oral QHS    PRN Medications: sodium chloride, acetaminophen, ondansetron **OR** ondansetron (ZOFRAN) IV, sodium chloride   Assessment/ Plan:    Active Problems:   Chronic kidney disease (CKD), stage III (moderate)   HTN (hypertension)   Diabetes mellitus type 2 in obese   S/P CABG x 4   Diastolic CHF, chronic   Exudative pleural effusion  #Post-thoracotomy pleural effusions Breathing continues to improve, and her Pleurx catheter continues to put out fluid. Cardiothoracic surgery is okay with discharge home. Breathing well on room air. She's concerned about the roads getting home to her house, so we will keep her overnight. She has pain when her catheter is drained. -Drain Pleurx catheter daily. -Continue Lasix 40 mg  -Supplemental oxygen for comfort when necessary. Will not qualify for home oxygen at discharge. -Ordered home health RN for Monday Wednesday Friday drainage of catheter as outpatient. -Ordered home health PT and OT. -Heart healthy/carb modified diet. -Continue tramadol 50 mg every 4 hours as needed. -Vicodin 5-325 prior to catheter drainage tonight. Can receive an extra dose overnight. -Discharge home tomorrow.  #Acute bacterial rhinosinusitis Improved after starting antibiotics. -Continue Augmentin 875-125 milligrams, 2 tablets twice a day, day 3 of 5. -Continue Flonase and saline nasal spray.  #Lower extremity edema Stable. -Continue Lasix as above.  #Constipation -MiraLAX daily for prevention.  #CAD s/p CABG -Continue aspirin 325 mg daily. -Continue atorvastatin.  #HTN Blood pressure stable. -Continue amlodipine 10 mg daily, hydralazine 75 mg TID.  #DM2 Glucose at goal. -CBGs ACHS. -SSI-S. -Hold home  Lantus.  #Fibromyalgia -Continue primidone and tramadol.   DVT PPX - heparin  CODE STATUS - Full  CONSULTS PLACED - Cardiothoracic surgery  DISPO - Discharge home tomorrow.  The patient does have a current PCP Allie Dimmer, MD) and does not need an [facility] hospital follow-up appointment after discharge.    Is the [facility] hospital follow-up appointment a one-time only appointment? N/A.  Does the patient have transportation limitations that hinder transportation to clinic appointments? yes   SERVICE NEEDED AT [place]         Y = Yes, Blank = No PT: Home health  OT: Home health  RN:   Equipment:  Other:      Length of Stay: 6 day(s)   Signed: Arman Filter, MD  PGY-1, Internal Medicine Resident Pager: [phone] (7AM-5PM) 08/10/2014, 9:53 AM

## 2014-08-10 NOTE — Progress Notes (Signed)
  Date: 08/10/2014  Patient name: Joyce [name]  Medical record number: [id number]  Date of birth: [birth year]   This patient has been seen and the plan of care was discussed with the house staff. Please see their note for complete details. I concur with their findings with the following additions/corrections: CVTA has cleared MS Jividen to go home. However, she has concerns about getting up the hill into her home, both for her and for home health, and her pain is not yet under control so we will keep her today and D/C in AM. Sch hydrocodone prior to drainage of fluid. Soc worker is setting up home PT/OT, and RN.   Bartholomew Crews, MD 08/10/2014, 11:27 AM

## 2014-08-10 NOTE — Progress Notes (Signed)
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 She was admitted in Jan for B post-thoracotomy pleural effusion. She had failed to respond to diuresis and developed Acute pre-renal failure. She was then treated with repeat thoracentesis, showing exudative effusions, and NSAIDS. She was discharged on 07/31/14. Since D/C, she had slowly worsening dyspnea and was too bad that she presented to ED and found to have reaccumulated B pleural effusions.     PT Comments    Amb with o2 today due to room air sats at rest 89%.  On 2 L/min o2 93%.  Follow Up Recommendations  Home health PT     Equipment Recommendations  None recommended by PT    Recommendations for Other Services       Precautions / Restrictions Precautions Precautions: Sternal Precaution Comments: pt is knowledgeable in sternal precautions Restrictions Weight Bearing Restrictions: No Other Position/Activity Restrictions: sternal precautions.    Mobility  Bed Mobility Overal bed mobility: Needs Assistance Bed Mobility: Supine to Sit Rolling: Supervision   Supine to sit: Supervision;HOB elevated        Transfers Overall transfer level: Modified independent   Transfers: Sit to/from Stand Sit to Stand: Modified independent (Device/Increase time)            Ambulation/Gait Ambulation/Gait assistance: Supervision Ambulation Distance (Feet): 150 Feet Assistive device: None Gait Pattern/deviations: Decreased step length - left;Decreased step length - right Gait velocity: decreased   General Gait Details: Pt ambulated on 2 L/min  due to RA o2 sat 89% at rest.  O2 93% HR 113 with gait. Pt reaching for rail occasionally.   Stairs            Wheelchair Mobility    Modified Rankin (Stroke Patients Only)        Balance     Sitting balance-Leahy Scale: Good                              Cognition Arousal/Alertness: Awake/alert Behavior During Therapy: WFL for tasks assessed/performed Overall Cognitive Status: Within Functional Limits for tasks assessed                      Exercises      General Comments General comments (skin integrity, edema, etc.): Pt too uncomfortable to sit in recliner after gait.      Pertinent Vitals/Pain Pain Assessment: 0-10 Pain Score: 5  Pain Location: chest Pain Intervention(s): RN gave pain meds during session    Home Living                      Prior Function            PT Goals (current goals can now be found in the care plan section) Acute Rehab PT Goals Patient Stated Goal: to get stronger PT Goal Formulation: With patient Time For Goal Achievement: 08/19/14 Potential to Achieve Goals: Good Progress towards PT goals: Progressing toward goals    Frequency  Min 3X/week    PT Plan Current plan remains appropriate    Co-evaluation             End of Session Equipment Utilized During Treatment: Gait belt;Oxygen Activity Tolerance: Patient limited by fatigue Patient left: in bed;with call  bell/phone within reach     Time: 1006-1028 PT Time Calculation (min) (ACUTE ONLY): 22 min  Charges:  $Gait Training: 8-22 mins                    G Codes:      Alaa Eyerman LUBECK 08/10/2014, 10:43 AM

## 2014-08-11 ENCOUNTER — Inpatient Hospital Stay (HOSPITAL_COMMUNITY): Payer: Medicare Other

## 2014-08-11 LAB — GLUCOSE, CAPILLARY
GLUCOSE-CAPILLARY: 135 mg/dL — AB (ref 70–99)
GLUCOSE-CAPILLARY: 137 mg/dL — AB (ref 70–99)
Glucose-Capillary: 154 mg/dL — ABNORMAL HIGH (ref 70–99)
Glucose-Capillary: 123 mg/dL — ABNORMAL HIGH (ref 70–99)

## 2014-08-11 MED ORDER — HYDROCODONE-ACETAMINOPHEN 5-325 MG PO TABS
1.0000 | ORAL_TABLET | Freq: Every day | ORAL | Status: DC | PRN
  Administered 2014-08-11 – 2014-08-13 (×3): 1 via ORAL
  Filled 2014-08-11 (×3): qty 1

## 2014-08-11 NOTE — Progress Notes (Signed)
4 Days Post-Op Procedure(s) (LRB): INSERTION PLEURAL DRAINAGE CATHETER (Left) Subjective: Left pleurx working well- 600cc daily CXR today shows large R effusion- will place R pleurx cath tomorrow am 0800  Objective: Vital signs in last 24 hours: Temp:  [98.8 F (37.1 C)-99.1 F (37.3 C)] 98.8 F (37.1 C) (02/16 0413) Pulse Rate:  [80-88] 80 (02/16 0413) Cardiac Rhythm:  [-] Normal sinus rhythm (02/16 0745) Resp:  [18-20] 20 (02/16 0413) BP: (126-153)/(56-62) 133/62 mmHg (02/16 0413) SpO2:  [96 %-99 %] 96 % (02/16 0413) Weight:  [184 lb 8 oz (83.689 kg)] 184 lb 8 oz (83.689 kg) (02/16 0413)  Hemodynamic parameters for last 24 hours:  stable  Intake/Output from previous day: 02/15 0701 - 02/16 0700 In: 603 [P.O.:600; I.V.:3] Out: 850 [Urine:300; Chest Tube:550] Intake/Output this shift: Total I/O In: 300 [P.O.:300] Out: 800 [Urine:800]  dec breath sounds on R  Lab Results: No results for input(s): WBC, HGB, HCT, PLT in the last 72 hours. BMET:  Recent Labs  08/09/14 0434  NA 137  K 4.4  CL 104  CO2 26  GLUCOSE 129*  BUN 16  CREATININE 1.40*  CALCIUM 8.4    PT/INR: No results for input(s): LABPROT, INR in the last 72 hours. ABG    Component Value Date/Time   PHART 7.295* 06/15/2014 2013   HCO3 19.7* 06/15/2014 2013   TCO2 18 06/16/2014 1712   ACIDBASEDEF 6.0* 06/15/2014 2013   O2SAT 97.0 06/15/2014 2013   CBG (last 3)   Recent Labs  08/10/14 1705 08/10/14 2120 08/11/14 0620  GLUCAP 127* 125* 135*    Assessment/Plan: S/P Procedure(s) (LRB): INSERTION PLEURAL DRAINAGE CATHETER (Left) R pleurx in am   LOS: 7 days    [name],[name] 08/11/2014

## 2014-08-11 NOTE — Progress Notes (Signed)
SATURATION QUALIFICATIONS: (This note is used to comply with regulatory documentation for home oxygen)  Patient Saturations on Room Air at Rest = 92%   Patient Saturations on Room Air while Ambulating = 87%  Patient Saturations on 2 Liters of oxygen while Ambulating = 94%   Please briefly explain why patient needs home oxygen: Pt gets short of breath and desats with activity and ambulation on room air.

## 2014-08-11 NOTE — Progress Notes (Signed)
  Date: 08/11/2014  Patient name: Joyce [name]  Medical record number: [id number]  Date of birth: Oct 18, 1940   This patient has been seen and the plan of care was discussed with the house staff. Please see their note for complete details. I concur with their findings with the following additions/corrections: Me Welling cont to have her chronic dyspnea and was found to be hypoxic on ambulation. She was not hypoxic on her last admit but was chronically dyspnic. The dyspnea was all 2/2 the effusions. The effusions shouldn't cause hypoxia as wouldn't impair gas exchange. Deconditioning could though and she has been in and out of H since December. Doubt other causes of hypoxia like PE. OK to D/C home with O2 and follow improvement with home PT. Will check with CVTS about freq of fluid drainage 2/2 amt drained each day.   Likely home today with home PT and RN  Bartholomew Crews, MD 08/11/2014, 10:29 AM

## 2014-08-11 NOTE — Progress Notes (Addendum)
Subjective: No overnight events, still with SOB but not worsened per pt. Pain with drainage of chest tube. 550cc drained yesterday. Desatruation with ambulation on room air to 87%. CXR this morning with persistent large effusion on the right; small left pleural effusion in the setting of Pleurex catheter. CVTS to place R Pleurex tomorrow.   Objective: Vital signs in last 24 hours: Filed Vitals:   08/10/14 1013 08/10/14 1336 08/10/14 2020 08/11/14 0413  BP: 146/60 126/56 153/60 133/62  Pulse:  88 86 80  Temp:  98.9 F (37.2 C) 99.1 F (37.3 C) 98.8 F (37.1 C)  TempSrc:  Oral Oral Oral  Resp:  18 19 20   Height:      Weight:    184 lb 8 oz (83.689 kg)  SpO2:  98% 99% 96%   Weight change: -9.6 oz (-0.272 kg)  Intake/Output Summary (Last 24 hours) at 08/11/14 1057 Last data filed at 08/11/14 1014  Gross per 24 hour  Intake    543 ml  Output   1650 ml  Net  -1107 ml   Vitals reviewed. General: Sitting on the side of the bed, NAD HEENT: EOMI Cardiac: RRR, no rubs, murmurs or gallops Pulm: Clear to auscultation bilaterally, no wheezes, rales, or rhonchi Abd: Soft, nontender, nondistended, BS present Ext: Moves all 4 extremities Neuro: Alert and oriented X3  Lab Results: Basic Metabolic Panel:  Recent Labs Lab 08/08/14 0440 08/09/14 0434  NA 138 137  K 4.2 4.4  CL 105 104  CO2 28 26  GLUCOSE 130* 129*  BUN 18 16  CREATININE 1.41* 1.40*  CALCIUM 8.3* 8.4   CBC:  Recent Labs Lab 08/08/14 0440  WBC 3.4*  HGB 9.1*  HCT 28.0*  MCV 87.0  PLT 144*   CBG:  Recent Labs Lab 08/09/14 2114 08/10/14 0638 08/10/14 1139 08/10/14 1705 08/10/14 2120 08/11/14 0620  GLUCAP 115* 134* 132* 127* 125* 135*    Micro Results: No results found for this or any previous visit (from the past 240 hour(s)).   Studies/Results: Dg Chest 2 View  08/11/2014   CLINICAL DATA:  Pleural effusions, CHF, diabetes.  EXAM: CHEST  2 VIEW  COMPARISON:  Portable chest x-ray of  June 07, 2015  FINDINGS: The small caliber left-sided chest tube remains in position with its tip lying posteriorly and inferiorly. No significant left pleural effusion is demonstrated. There spur left basilar atelectasis. On the right there is a moderate-sized effusion as well as middle and lower lobe atelectasis or infiltrate. The cardiac silhouette is top-normal to mildly enlarged. The pulmonary vascularity is not engorged. There are 7 intact sternal wires from previous CABG. The bony thorax exhibits no acute abnormality.  IMPRESSION: 1. Persistent moderate-sized right pleural effusion with is right basilar atelectasis or pneumonia. 2. On the left there is only a tiny pleural effusion remaining given the presence of the small caliber chest tube. There is also mild left lower lobe atelectasis.   Electronically Signed   By: David  Martinique   On: 08/11/2014 09:11   Medications: I have reviewed the patient's current medications. Scheduled Meds: . amLODipine  10 mg Oral Daily  . amoxicillin-clavulanate  1 tablet Oral BID WC  . aspirin EC  325 mg Oral Daily  . atorvastatin  40 mg Oral q1800  . ferrous Q000111Q C-folic acid  1 capsule Oral Q breakfast  . fluticasone  2 spray Each Nare Daily  . furosemide  40 mg Oral Daily  . hydrALAZINE  75  mg Oral 3 times per day  . insulin aspart  0-9 Units Subcutaneous TID WC  . polyethylene glycol  17 g Oral Daily  . primidone  50 mg Oral Daily  . sodium chloride  1 spray Each Nare TID AC & HS  . sodium chloride  3 mL Intravenous Q12H  . sodium chloride  3 mL Intravenous Q12H  . traMADol  50 mg Oral Q4H  . traZODone  50 mg Oral QHS   Continuous Infusions: . sodium chloride 50 mL/hr at 08/09/14 2120   PRN Meds:.sodium chloride, acetaminophen, ondansetron **OR** ondansetron (ZOFRAN) IV, sodium chloride   Assessment/Plan: 74 yo F s/p thoracotomy presents with SOB and was found to have peluraal effusions.   #Post-thoracotomy pleural  effusions: Pt with SOB and desaturates to 87% on room air. Cather output 550 yesterday. CXR today with small effusion in the left in the setting of the L Pleurex chest tube. However, she has a large right sided effusion that will now require Pleurex catheter placement tomorrow by CVTS. Pt wil need daily drain emptying until output <300cc/day. -Drain Pleurx catheter daily. - Continue Lasix 40 mg, checking BMP in am -Supplemental oxygen for comfort when necessary.  - HH O2 at discharge - [facility] RN for daily drainage of catheter until output <300cc/day as outpatient. - HH PT and OT. - Heart healthy/carb modified diet. - Continue tramadol 50 mg every 4 hours as needed. - Vicodin 5-325 prior to catheter drainage tonight.   #Acute bacterial rhinosinusitis: Improved after starting antibiotics. -Continue Augmentin 875-125 milligrams, 2 tablets twice a day, day 4 of 5. -Continue Flonase and saline nasal spray.  #Lower extremity edema: Stable. -Continue Lasix as above.  #Constipation: -MiraLAX daily for prevention.  #CAD s/p CABG -Continue aspirin 325 mg daily. -Continue atorvastatin.  #HTN Blood pressure stable. -Continue amlodipine 10 mg daily, hydralazine 75 mg TID.  #DM2 Glucose at goal. -CBGs ACHS. -SSI-S. -Holding home Lantus.  #Fibromyalgia -Continue primidone and tramadol.  #DVT PPx: [place] Heparin   Dispo: Disposition is deferred at this time, awaiting improvement of current medical problems.  Anticipated discharge in approximately 2-3 day(s).   The patient does have a current PCP Allie Dimmer, MD) and does not need an [facility] hospital follow-up appointment after discharge.  The patient does not have transportation limitations that hinder transportation to clinic appointments.  .Services Needed at time of discharge: Y = Yes, Blank = No PT:   OT:   RN:   Equipment:   Other:     LOS: 7 days   Otho Bellows, MD 08/11/2014, 10:57 AM

## 2014-08-11 NOTE — Progress Notes (Signed)
Pleurx drained per MD order and protocol. 450cc drained. New dressing applied. Will continue to monitor.

## 2014-08-12 ENCOUNTER — Encounter (HOSPITAL_COMMUNITY): Payer: Self-pay | Admitting: Certified Registered Nurse Anesthetist

## 2014-08-12 ENCOUNTER — Inpatient Hospital Stay (HOSPITAL_COMMUNITY): Payer: Medicare Other | Admitting: Certified Registered Nurse Anesthetist

## 2014-08-12 ENCOUNTER — Inpatient Hospital Stay (HOSPITAL_COMMUNITY): Payer: Medicare Other

## 2014-08-12 ENCOUNTER — Encounter (HOSPITAL_COMMUNITY)
Admission: EM | Disposition: A | Discharge status: Home Health | Payer: Self-pay | Source: Home / Self Care | Attending: Internal Medicine

## 2014-08-12 ENCOUNTER — Ambulatory Visit: Payer: Medicare Other | Admitting: Cardiothoracic Surgery

## 2014-08-12 DIAGNOSIS — J90 Pleural effusion, not elsewhere classified: Secondary | ICD-10-CM

## 2014-08-12 HISTORY — PX: CHEST TUBE INSERTION: SHX231

## 2014-08-12 LAB — GLUCOSE, CAPILLARY
GLUCOSE-CAPILLARY: 220 mg/dL — AB (ref 70–99)
Glucose-Capillary: 148 mg/dL — ABNORMAL HIGH (ref 70–99)
Glucose-Capillary: 135 mg/dL — ABNORMAL HIGH (ref 70–99)
Glucose-Capillary: 128 mg/dL — ABNORMAL HIGH (ref 70–99)
Glucose-Capillary: 146 mg/dL — ABNORMAL HIGH (ref 70–99)

## 2014-08-12 LAB — BASIC METABOLIC PANEL
ANION GAP: 5 (ref 5–15)
BUN: 19 mg/dL (ref 6–23)
CO2: 28 mmol/L (ref 19–32)
CREATININE: 1.42 mg/dL — AB (ref 0.50–1.10)
Calcium: 8.6 mg/dL (ref 8.4–10.5)
Chloride: 103 mmol/L (ref 96–112)
GFR calc non Af Amer: 36 mL/min — ABNORMAL LOW (ref >90)
GFR, EST AFRICAN AMERICAN: 42 mL/min — AB (ref >90)
Glucose, Bld: 146 mg/dL — ABNORMAL HIGH (ref 70–99)
POTASSIUM: 4.1 mmol/L (ref 3.5–5.1)
Sodium: 136 mmol/L (ref 135–145)

## 2014-08-12 SURGERY — INSERTION, PLEURAL DRAINAGE CATHETER
Anesthesia: Monitor Anesthesia Care | Site: Chest | Laterality: Right

## 2014-08-12 MED ORDER — ONDANSETRON HCL 4 MG/2ML IJ SOLN: 4.0000 mg | Freq: Four times a day (QID) | INTRAMUSCULAR | Status: DC | PRN

## 2014-08-12 MED ORDER — MIDAZOLAM HCL 5 MG/5ML IJ SOLN
INTRAMUSCULAR | Status: DC | PRN
  Administered 2014-08-12 (×2): 1 mg via INTRAVENOUS

## 2014-08-12 MED ORDER — CEFUROXIME SODIUM 1.5 G IJ SOLR
1.5000 g | INTRAMUSCULAR | Status: DC
  Filled 2014-08-12: qty 1.5

## 2014-08-12 MED ORDER — PROPOFOL INFUSION 10 MG/ML OPTIME
INTRAVENOUS | Status: DC | PRN
  Administered 2014-08-12: 30 ug/kg/min via INTRAVENOUS

## 2014-08-12 MED ORDER — FENTANYL CITRATE 0.05 MG/ML IJ SOLN
INTRAMUSCULAR | Status: AC
  Filled 2014-08-12: qty 5

## 2014-08-12 MED ORDER — CEFAZOLIN SODIUM-DEXTROSE 2-3 GM-% IV SOLR
INTRAVENOUS | Status: DC | PRN
  Administered 2014-08-12: 2 g via INTRAVENOUS

## 2014-08-12 MED ORDER — FENTANYL CITRATE 0.05 MG/ML IJ SOLN: 25.0000 ug | INTRAMUSCULAR | Status: DC | PRN

## 2014-08-12 MED ORDER — FENTANYL CITRATE 0.05 MG/ML IJ SOLN
INTRAMUSCULAR | Status: DC | PRN
  Administered 2014-08-12: 50 ug via INTRAVENOUS
  Administered 2014-08-12: 25 ug via INTRAVENOUS

## 2014-08-12 MED ORDER — LIDOCAINE HCL (PF) 1 % IJ SOLN
INTRAMUSCULAR | Status: AC
  Filled 2014-08-12: qty 30

## 2014-08-12 MED ORDER — OXYCODONE HCL 5 MG PO TABS: 5.0000 mg | ORAL_TABLET | Freq: Once | ORAL | Status: DC | PRN

## 2014-08-12 MED ORDER — OXYCODONE HCL 5 MG/5ML PO SOLN: 5.0000 mg | Freq: Once | ORAL | Status: DC | PRN

## 2014-08-12 MED ORDER — 0.9 % SODIUM CHLORIDE (POUR BTL) OPTIME
TOPICAL | Status: DC | PRN
  Administered 2014-08-12: 1000 mL

## 2014-08-12 MED ORDER — MIDAZOLAM HCL 2 MG/2ML IJ SOLN
INTRAMUSCULAR | Status: AC
  Filled 2014-08-12: qty 2

## 2014-08-12 MED ORDER — LIDOCAINE HCL 1 % IJ SOLN
INTRAMUSCULAR | Status: DC | PRN
  Administered 2014-08-12: 10 mL

## 2014-08-12 SURGICAL SUPPLY — 28 items
ADH SKN CLS APL DERMABOND .7 (GAUZE/BANDAGES/DRESSINGS) ×1
CANISTER SUCTION 2500CC (MISCELLANEOUS) ×3 IMPLANT
COVER SURGICAL LIGHT HANDLE (MISCELLANEOUS) ×3 IMPLANT
DERMABOND ADVANCED (GAUZE/BANDAGES/DRESSINGS) ×2
DERMABOND ADVANCED .7 DNX12 (GAUZE/BANDAGES/DRESSINGS) ×1 IMPLANT
DRAPE C-ARM 42X72 X-RAY (DRAPES) ×3 IMPLANT
DRAPE LAPAROSCOPIC ABDOMINAL (DRAPES) ×3 IMPLANT
ELECT CAUTERY BLADE 6.4 (BLADE) ×2 IMPLANT
GLOVE BIO SURGEON STRL SZ7.5 (GLOVE) ×6 IMPLANT
GOWN STRL REUS W/ TWL LRG LVL3 (GOWN DISPOSABLE) ×2 IMPLANT
GOWN STRL REUS W/TWL LRG LVL3 (GOWN DISPOSABLE) ×6
KIT BASIN OR (CUSTOM PROCEDURE TRAY) ×3 IMPLANT
KIT PLEURX DRAIN CATH 1000ML (MISCELLANEOUS) ×5 IMPLANT
KIT PLEURX DRAIN CATH 15.5FR (DRAIN) ×3 IMPLANT
KIT ROOM TURNOVER OR (KITS) ×3 IMPLANT
NDL HYPO 25GX1X1/2 BEV (NEEDLE) ×1 IMPLANT
NEEDLE HYPO 25GX1X1/2 BEV (NEEDLE) ×3 IMPLANT
NS IRRIG 1000ML POUR BTL (IV SOLUTION) ×3 IMPLANT
PACK GENERAL/GYN (CUSTOM PROCEDURE TRAY) ×3 IMPLANT
PAD ARMBOARD 7.5X6 YLW CONV (MISCELLANEOUS) ×6 IMPLANT
SET DRAINAGE LINE (MISCELLANEOUS) IMPLANT
SUT ETHILON 3 0 PS 1 (SUTURE) ×3 IMPLANT
SUT SILK 2 0 SH (SUTURE) ×3 IMPLANT
SUT VIC AB 3-0 SH 8-18 (SUTURE) ×2 IMPLANT
SYR CONTROL 10ML LL (SYRINGE) ×3 IMPLANT
TOWEL OR 17X24 6PK STRL BLUE (TOWEL DISPOSABLE) ×3 IMPLANT
TOWEL OR 17X26 10 PK STRL BLUE (TOWEL DISPOSABLE) ×3 IMPLANT
WATER STERILE IRR 1000ML POUR (IV SOLUTION) ×3 IMPLANT

## 2014-08-12 NOTE — Anesthesia Postprocedure Evaluation (Signed)
  Anesthesia Post-op Note  Patient: Joyce [name]  Procedure(s) Performed: Procedure(s): INSERTION PLEURAL DRAINAGE CATHETER (Right)  Patient Location: PACU  Anesthesia Type: MAC   Level of Consciousness: awake, alert  and oriented  Airway and Oxygen Therapy: Patient Spontanous Breathing  Post-op Pain: mild  Post-op Assessment: Post-op Vital signs reviewed  Post-op Vital Signs: Reviewed  Last Vitals:  Filed Vitals:   08/12/14 1419  BP: 131/63  Pulse: 103  Temp:   Resp: 18    Complications: No apparent anesthesia complications

## 2014-08-12 NOTE — Transfer of Care (Signed)
Immediate Anesthesia Transfer of Care Note  Patient: Joyce [name]  Procedure(s) Performed: Procedure(s): INSERTION PLEURAL DRAINAGE CATHETER (Right)  Patient Location: PACU  Anesthesia Type:MAC  Level of Consciousness: awake, alert , oriented and patient cooperative  Airway & Oxygen Therapy: Patient Spontanous Breathing and Patient connected to face mask oxygen  Post-op Assessment: Report given to RN, Post -op Vital signs reviewed and stable, Patient moving all extremities and Patient moving all extremities X 4  Post vital signs: Reviewed and stable  Last Vitals:  Filed Vitals:   08/12/14 0554  BP: 132/53  Pulse: 75  Temp: 36.8 C  Resp: 18    Complications: No apparent anesthesia complications

## 2014-08-12 NOTE — Progress Notes (Signed)
Subjective:    She reports some improvement in her breathing since having the R Pleurx catheter placed this morning, but she has been coughing some.  Interval Events: -R Pleurx catheter placed by Dr. Prescott Gum with 1400 cc of pleural fluid drained this morning. -Vital signs stable.   Objective:   Vital Signs:   Temp:  [98 F (36.7 C)-98.4 F (36.9 C)] 98.2 F (36.8 C) (02/17 1102) Pulse Rate:  [75-100] 100 (02/17 1102) Resp:  [5-22] 20 (02/17 1102) BP: (119-140)/(52-71) 136/54 mmHg (02/17 1102) SpO2:  [94 %-100 %] 94 % (02/17 1102) Weight:  [183 lb 13.8 oz (83.4 kg)] 183 lb 13.8 oz (83.4 kg) (02/17 0554) Last BM Date: 08/10/14  24-hour weight change: Weight change: -10.2 oz (-0.289 kg)  Intake/Output:   Intake/Output Summary (Last 24 hours) at 08/12/14 1130 Last data filed at 08/12/14 0914  Gross per 24 hour  Intake    750 ml  Output   2250 ml  Net  -1500 ml      Physical Exam: General: Vital signs reviewed and noted. Well-developed, well-nourished, in no acute distress; alert, appropriate and cooperative throughout examination.  Lungs:  Bilateral Pleurx catheters in place and dressed. Some bruising on the R torso from the catheter placement.  Improved breath sounds at R lung base with residual crackled bilaterally.  Heart: RRR. S1 and S2 normal without gallop, murmur, or rubs.  Abdomen:  BS normoactive. Soft, Nondistended, non-tender.  No masses or organomegaly.  Extremities: Trace edema bilateral lower extremities stable.     Labs:  Basic Metabolic Panel:  Recent Labs Lab 08/06/14 0558 08/08/14 0440 08/09/14 0434 08/12/14 0548  NA 140 138 137 136  K 3.9 4.2 4.4 4.1  CL 110 105 104 103  CO2 23 28 26 28   GLUCOSE 95 130* 129* 146*  BUN 8 18 16 19   CREATININE 1.14* 1.41* 1.40* 1.42*  CALCIUM 9.1 8.3* 8.4 8.6   CBC:  Recent Labs Lab 08/08/14 0440  WBC 3.4*  HGB 9.1*  HCT 28.0*  MCV 87.0  PLT 144*    CBG:  Recent Labs Lab 08/11/14 1121  08/11/14 1644 08/11/14 2137 08/12/14 0552 08/12/14 0937  GLUCAP 137* 154* 123* 148* 135*    Microbiology: Results for orders placed or performed during the hospital encounter of 07/26/14  MRSA PCR Screening     Status: None   Collection Time: 07/26/14  7:04 PM  Result Value Ref Range Status   MRSA by PCR NEGATIVE NEGATIVE Final    Comment:        The GeneXpert MRSA Assay (FDA approved for NASAL specimens only), is one component of a comprehensive MRSA colonization surveillance program. It is not intended to diagnose MRSA infection nor to guide or monitor treatment for MRSA infections.   Body fluid culture     Status: None   Collection Time: 07/28/14  1:34 PM  Result Value Ref Range Status   Specimen Description FLUID PLEURAL LEFT  Final   Special Requests NONE  Final   Gram Stain   Final    FEW WBC PRESENT, PREDOMINANTLY MONONUCLEAR NO ORGANISMS SEEN Performed at [facility]    Culture   Final    NO GROWTH 3 DAYS Performed at [facility]    Report Status 08/01/2014 FINAL  Final    Coagulation Studies: No results for input(s): LABPROT, INR in the last 72 hours.   Imaging: Dg Chest 2 View  08/11/2014   CLINICAL DATA:  Pleural effusions, CHF, diabetes.  EXAM: CHEST  2 VIEW  COMPARISON:  Portable chest x-ray of June 07, 2015  FINDINGS: The small caliber left-sided chest tube remains in position with its tip lying posteriorly and inferiorly. No significant left pleural effusion is demonstrated. There spur left basilar atelectasis. On the right there is a moderate-sized effusion as well as middle and lower lobe atelectasis or infiltrate. The cardiac silhouette is top-normal to mildly enlarged. The pulmonary vascularity is not engorged. There are 7 intact sternal wires from previous CABG. The bony thorax exhibits no acute abnormality.  IMPRESSION: 1. Persistent moderate-sized right pleural effusion with is right basilar atelectasis or pneumonia. 2. On  the left there is only a tiny pleural effusion remaining given the presence of the small caliber chest tube. There is also mild left lower lobe atelectasis.   Electronically Signed   By: David  Martinique   On: 08/11/2014 09:11   Dg Chest Port 1 View  08/12/2014   CLINICAL DATA:  PleurX catheter placement  EXAM: DG C-ARM 1-60 MIN - NRPT [facility]; PORTABLE CHEST - 1 VIEW  COMPARISON:  August 11, 2014  FINDINGS: There is a chest drain in the right base with resolution of most of the right effusion noted on prior study. There is also a chest drain at the left base, stable. There is a rather minimal left effusion. There is subsegmental atelectasis in each lung base. Elsewhere lungs are clear. No pneumothorax. Heart is mildly enlarged with pulmonary vascularity within normal limits. No adenopathy. Patient is status post coronary artery bypass grafting.  IMPRESSION: There is a in chest range catheter in each basilar region. Currently minimal bilateral effusions and mild bibasilar atelectasis. Lungs elsewhere clear. Heart is mildly enlarged.   Electronically Signed   By: Lowella Grip III M.D.   On: 08/12/2014 09:44   Dg C-arm 1-60 Min-no Report  08/12/2014   CLINICAL DATA: Pleurx Catheter insertion   C-ARM 1-60 MINUTES  Fluoroscopy was utilized by the requesting physician.  No radiographic  interpretation.        Medications:    Infusions: . sodium chloride 50 mL/hr at 08/09/14 2120    Scheduled Medications: . amLODipine  10 mg Oral Daily  . amoxicillin-clavulanate  1 tablet Oral BID WC  . aspirin EC  325 mg Oral Daily  . atorvastatin  40 mg Oral q1800  . ferrous Q000111Q C-folic acid  1 capsule Oral Q breakfast  . fluticasone  2 spray Each Nare Daily  . furosemide  40 mg Oral Daily  . hydrALAZINE  75 mg Oral 3 times per day  . insulin aspart  0-9 Units Subcutaneous TID WC  . polyethylene glycol  17 g Oral Daily  . primidone  50 mg Oral Daily  . sodium chloride  1 spray Each Nare TID  AC & HS  . sodium chloride  3 mL Intravenous Q12H  . sodium chloride  3 mL Intravenous Q12H  . traMADol  50 mg Oral Q4H  . traZODone  50 mg Oral QHS    PRN Medications: sodium chloride, acetaminophen, HYDROcodone-acetaminophen, ondansetron **OR** ondansetron (ZOFRAN) IV, sodium chloride   Assessment/ Plan:    Active Problems:   Chronic kidney disease (CKD), stage III (moderate)   HTN (hypertension)   Diabetes mellitus type 2 in obese   S/P CABG x 4   Diastolic CHF, chronic   Exudative pleural effusion  #Post-thoracotomy pleural effusions Breathing improved after placement of R Pleurx catheter. Per pt, CT surgery  would like to keep her overnight to observe.  Desaturated with ambulation yesterday, so will arrange home O2. -Drain Pleurx catheters daily as inpatient, MWF as outpatient. -Observe overnight with likely discharge home tomorrow. -Repeat CXR tomorrow. -Continue Lasix 40 mg  -Supplemental oxygen to achieve sats >92% -Home O2 ordered. -Home health PT, OT, and RN ordered. -Heart healthy/carb modified diet. -Continue tramadol 50 mg every 4 hours as needed and Vicodin 5-325 PRN daily when draining catheter. -Possible discharge home tomorrow.  #Acute bacterial rhinosinusitis Continues to improve. -Continue Augmentin 875-125 milligrams, 2 tablets twice a day, last day today. -Continue Flonase and saline nasal spray.  #Lower extremity edema Stable. -Continue Lasix as above.  #Constipation -MiraLAX daily for prevention.  #CAD s/p CABG -Continue aspirin 325 mg daily. -Continue atorvastatin.  #HTN Blood pressure stable. -Continue amlodipine 10 mg daily, hydralazine 75 mg TID.  #DM2 Glucose at goal. -CBGs ACHS. -SSI-S. -Hold home Lantus.  #Fibromyalgia -Continue primidone and tramadol.   DVT PPX - heparin  CODE STATUS - Full  CONSULTS PLACED - Cardiothoracic surgery  DISPO - Discharge home tomorrow.  The patient does have a current PCP  Allie Dimmer, MD) and does not need an [facility] hospital follow-up appointment after discharge.    Is the [facility] hospital follow-up appointment a one-time only appointment? N/A.  Does the patient have transportation limitations that hinder transportation to clinic appointments? yes   SERVICE NEEDED AT [place]         Y = Yes, Blank = No PT: Home health  OT: Home health  RN:   Equipment: Oxygen  Other:      Length of Stay: 8 day(s)   Signed: Arman Filter, MD  PGY-1, Internal Medicine Resident Pager: [phone] (7AM-5PM) 08/12/2014, 11:30 AM

## 2014-08-12 NOTE — Anesthesia Preprocedure Evaluation (Signed)
Anesthesia Evaluation  Patient identified by MRN, date of birth, ID band Patient awake    Reviewed: Allergy & Precautions, NPO status , Patient's Chart, lab work & pertinent test results  Airway Mallampati: II   Neck ROM: full    Dental   Pulmonary shortness of breath, pneumonia -,          Cardiovascular hypertension, + angina + CAD, + Past MI, + CABG and +CHF     Neuro/Psych  Neuromuscular disease    GI/Hepatic   Endo/Other  diabetes, Type 2  Renal/GU Renal InsufficiencyRenal disease     Musculoskeletal  (+) Arthritis -,   Abdominal   Peds  Hematology   Anesthesia Other Findings   Reproductive/Obstetrics                             Anesthesia Physical Anesthesia Plan  ASA: III  Anesthesia Plan: MAC   Post-op Pain Management:    Induction: Intravenous  Airway Management Planned: Simple Face Mask  Additional Equipment:   Intra-op Plan:   Post-operative Plan:   Informed Consent: I have reviewed the patients History and Physical, chart, labs and discussed the procedure including the risks, benefits and alternatives for the proposed anesthesia with the patient or authorized representative who has indicated his/her understanding and acceptance.     Plan Discussed with: CRNA, Anesthesiologist and Surgeon  Anesthesia Plan Comments:         Anesthesia Quick Evaluation

## 2014-08-12 NOTE — Progress Notes (Signed)
PT Cancellation Note  Patient Details Name: Joyce [name] MRN: [id number] DOB: 11/30/1940   Cancelled Treatment:    Reason Eval/Treat Not Completed: Fatigue/lethargy limiting ability to participate;Other (comment) (Just completed hd)   Ramond Dial 08/12/2014, 2:51 PM   Mee Hives, PT MS Acute Rehab Dept. Number: [id number]

## 2014-08-12 NOTE — Progress Notes (Addendum)
Emptied pluerex cath on the left side per MD order per hospital policy. Patient tolerated well. Did not want the right side drained today. 400 cc removed.

## 2014-08-12 NOTE — Progress Notes (Signed)
The patient was examined and preop studies reviewed. There has been no change from the prior exam and the patient is ready for surgery.   Plan Right pleurx catheter on B [place] for pleural efusion

## 2014-08-12 NOTE — Brief Op Note (Signed)
08/04/2014 - 08/12/2014  9:32 AM  PATIENT:  Joyce [name]  74 y.o. female  PRE-OPERATIVE DIAGNOSIS:  moderate right pleural effusion  POST-OPERATIVE DIAGNOSIS:  moderate right pleural effusion  PROCEDURE:  Procedure(s): INSERTION PLEURAL DRAINAGE CATHETER (Right)  Drainage 1400 cc of R pleural fluid  SURGEON:  Surgeon(s) and Role:    * Ivin Poot, MD - Primary  PHYSICIAN ASSISTANT: none  ASSISTANTS: none   ANESTHESIA:   IV sedation, local  EBL:  Total I/O In: 150 [I.V.:150] Out: -   BLOOD ADMINISTERED:none  DRAINS: none , R pleurx catheter  LOCAL MEDICATIONS USED:  LIDOCAINE  and Amount: 12 ml  SPECIMEN:  No Specimen  DISPOSITION OF SPECIMEN:  N/A  COUNTS:  YES  TOURNIQUET:  * No tourniquets in log *  DICTATION: .Dragon Dictation  PLAN OF CARE: Admit to inpatient   PATIENT DISPOSITION:  PACU - hemodynamically stable.   Delay start of Pharmacological VTE agent (>24hrs) due to surgical blood loss or risk of bleeding: yes

## 2014-08-13 ENCOUNTER — Encounter (HOSPITAL_COMMUNITY): Payer: Self-pay | Admitting: Cardiothoracic Surgery

## 2014-08-13 DIAGNOSIS — J309 Allergic rhinitis, unspecified: Secondary | ICD-10-CM

## 2014-08-13 DIAGNOSIS — E669 Obesity, unspecified: Secondary | ICD-10-CM

## 2014-08-13 DIAGNOSIS — I5032 Chronic diastolic (congestive) heart failure: Secondary | ICD-10-CM

## 2014-08-13 DIAGNOSIS — J019 Acute sinusitis, unspecified: Secondary | ICD-10-CM

## 2014-08-13 DIAGNOSIS — B9689 Other specified bacterial agents as the cause of diseases classified elsewhere: Secondary | ICD-10-CM

## 2014-08-13 LAB — GLUCOSE, CAPILLARY
GLUCOSE-CAPILLARY: 142 mg/dL — AB (ref 70–99)
Glucose-Capillary: 189 mg/dL — ABNORMAL HIGH (ref 70–99)

## 2014-08-13 MED ORDER — POLYETHYLENE GLYCOL 3350 17 G PO PACK: 17.0000 g | PACK | Freq: Every day | ORAL | Status: DC | PRN

## 2014-08-13 MED ORDER — FUROSEMIDE 40 MG PO TABS: 40.0000 mg | ORAL_TABLET | Freq: Every day | ORAL | Status: DC

## 2014-08-13 NOTE — Progress Notes (Signed)
Pleural fluid drained off bilateral lungs. Left side: 340ml yellow fluid with no complications. Right side: 435ml brown fluid drained off. Patient tolerated well. See V/S for bp after procedure. Cato Mulligan RN

## 2014-08-13 NOTE — Discharge Instructions (Signed)
·   Thank you for allowing [place] to be involved in your healthcare while you were hospitalized at [facility].   Please note that there have been changes to your home medications.  --> PLEASE LOOK AT YOUR DISCHARGE MEDICATION LIST FOR DETAILS.   Please call your PCP if you have any questions or concerns, or any difficulty getting any of your medications.  Please return to the ER if you have worsening of your symptoms or new severe symptoms arise.  We have arranged for a home health nurse to help drain your catheters. You will need them drained every day until the end of next week.  After that, they will continue to drain the catheters every other day until you follow up with Dr. Prescott Gum.

## 2014-08-13 NOTE — Op Note (Signed)
[name], [name] NO.:  [id number]  MEDICAL RECORD NO.:  [id number]  LOCATION:  [id number]                        FACILITY:  [facility]  PHYSICIAN:  Ivin Poot, M.D.  DATE OF BIRTH:  [birth year]  DATE OF PROCEDURE:  08/12/2014 DATE OF DISCHARGE:                              OPERATIVE REPORT   OPERATION:  Placement of right PleurX catheter.  PREOPERATIVE DIAGNOSIS:  Persistent, recurrent right pleural effusion.  POSTOPERATIVE DIAGNOSIS:  Persistent, recurrent right pleural effusion.  SURGEON:  Ivin Poot, M.D.  ANESTHESIA:  IV conscious sedation, monitored by anesthesia and local lidocaine.  OPERATIVE PROCEDURE:  The patient was brought to the operating room after informed consent was obtained for placement of right PleurX catheter for recurrent right pleural effusion, despite thoracentesis in the past.  She has previously had a left Pleurx catheter placed for a benign effusion  The patient was placed supine on the operating table. IV conscious sedation was administered by the Anesthesia team and monitored.  The right chest was prepped and draped as a sterile field. A proper time-out was performed.  Local lidocaine was infiltrated beneath the right breast line in the fifth interspace.  A secondary local anesthesia was infiltrated at the right costal margin in the midclavicular line.  A small incision was made in the upper area that was anesthetized.  Through this, a needle with sheath was inserted into the pleural space and clear fluid was aspirated.  The needle was removed and the sheath was left in place.  Through the sheath, a guidewire was passed into the pleural space dependently and position was documented by C-arm fluoroscopy.  Next a small incision was made in the lower anesthetized area and the PleurX catheter was tunneled from the lower incision and the upper incision.  Over the guidewire, a dilator and then dilator sheath were inserted.  The  dilator was removed from the sheath into the tearaway sheath.  The PleurX catheter was inserted into pleural space and the sheath was removed.  The Pleurx was connected to a vacuum bottle and 1.4 L of fluid was removed.  The upper incision was closed with interrupted Vicryl for the subcutaneous layer and interrupted nylon for the skin. The lower incision was closed with a silk suture which was used to secure the catheter to the skin.  The catheter was capped and incorporated into a sterile dressing.  A chest x-ray was taken in the OR showing resolution of the right pleural effusion without pneumothorax and the catheter in good position.    Ivin Poot, M.D.    PV/MEDQ  D:  08/12/2014  T:  08/13/2014  Job:  [id number]

## 2014-08-13 NOTE — Progress Notes (Signed)
Subjective:    She reports improvement in her breathing since having her right Pleurx catheter placed yesterday. Her cough post procedure has resolved, and she reports not feeling as though she needs oxygen this morning. She would like to go home this afternoon. Her nasal discharge is resolved.  Interval Events: -400 mL drained from the left Pleurx catheter.   Objective:   Vital Signs:   Temp:  [98.1 F (36.7 C)-98.4 F (36.9 C)] 98.2 F (36.8 C) (02/18 0659) Pulse Rate:  [86-103] 88 (02/18 0659) Resp:  [5-22] 18 (02/18 0659) BP: (119-140)/(53-71) 122/56 mmHg (02/18 0659) SpO2:  [94 %-100 %] 99 % (02/18 0659) Weight:  [183 lb 3.2 oz (83.1 kg)] 183 lb 3.2 oz (83.1 kg) (02/18 0659) Last BM Date: 08/12/14  24-hour weight change: Weight change: -10.6 oz (-0.3 kg)  Intake/Output:   Intake/Output Summary (Last 24 hours) at 08/13/14 0749 Last data filed at 08/13/14 0500  Gross per 24 hour  Intake 1126.67 ml  Output    900 ml  Net 226.67 ml      Physical Exam: General: Vital signs reviewed and noted. Well-developed, well-nourished, in no acute distress; alert, appropriate and cooperative throughout examination.  Lungs:  Bilateral Pleurx catheters in place and dressed. Residual crackles bilaterally with right greater than left decreased breath sounds.   Heart: RRR. S1 and S2 normal without gallop, murmur, or rubs.  Abdomen:  BS normoactive. Soft, Nondistended, non-tender.  No masses or organomegaly.  Extremities: Trace edema bilateral lower extremities stable.     Labs:  Basic Metabolic Panel:  Recent Labs Lab 08/08/14 0440 08/09/14 0434 08/12/14 0548  NA 138 137 136  K 4.2 4.4 4.1  CL 105 104 103  CO2 28 26 28   GLUCOSE 130* 129* 146*  BUN 18 16 19   CREATININE 1.41* 1.40* 1.42*  CALCIUM 8.3* 8.4 8.6   CBC:  Recent Labs Lab 08/08/14 0440  WBC 3.4*  HGB 9.1*  HCT 28.0*  MCV 87.0  PLT 144*    CBG:  Recent Labs Lab 08/12/14 0937 08/12/14 1154  08/12/14 1621 08/12/14 2116 08/13/14 0655  GLUCAP 135* 128* 146* 220* 142*    Microbiology: Results for orders placed or performed during the hospital encounter of 07/26/14  MRSA PCR Screening     Status: None   Collection Time: 07/26/14  7:04 PM  Result Value Ref Range Status   MRSA by PCR NEGATIVE NEGATIVE Final    Comment:        The GeneXpert MRSA Assay (FDA approved for NASAL specimens only), is one component of a comprehensive MRSA colonization surveillance program. It is not intended to diagnose MRSA infection nor to guide or monitor treatment for MRSA infections.   Body fluid culture     Status: None   Collection Time: 07/28/14  1:34 PM  Result Value Ref Range Status   Specimen Description FLUID PLEURAL LEFT  Final   Special Requests NONE  Final   Gram Stain   Final    FEW WBC PRESENT, PREDOMINANTLY MONONUCLEAR NO ORGANISMS SEEN Performed at [facility]    Culture   Final    NO GROWTH 3 DAYS Performed at [facility]    Report Status 08/01/2014 FINAL  Final    Coagulation Studies: No results for input(s): LABPROT, INR in the last 72 hours.   Imaging: Dg Chest 2 View  08/11/2014   CLINICAL DATA:  Pleural effusions, CHF, diabetes.  EXAM: CHEST  2 VIEW  COMPARISON:  Portable chest x-ray of June 07, 2015  FINDINGS: The small caliber left-sided chest tube remains in position with its tip lying posteriorly and inferiorly. No significant left pleural effusion is demonstrated. There spur left basilar atelectasis. On the right there is a moderate-sized effusion as well as middle and lower lobe atelectasis or infiltrate. The cardiac silhouette is top-normal to mildly enlarged. The pulmonary vascularity is not engorged. There are 7 intact sternal wires from previous CABG. The bony thorax exhibits no acute abnormality.  IMPRESSION: 1. Persistent moderate-sized right pleural effusion with is right basilar atelectasis or pneumonia. 2. On the left there is  only a tiny pleural effusion remaining given the presence of the small caliber chest tube. There is also mild left lower lobe atelectasis.   Electronically Signed   By: David  Martinique   On: 08/11/2014 09:11   Dg Chest Port 1 View  08/12/2014   CLINICAL DATA:  PleurX catheter placement  EXAM: DG C-ARM 1-60 MIN - NRPT [facility]; PORTABLE CHEST - 1 VIEW  COMPARISON:  August 11, 2014  FINDINGS: There is a chest drain in the right base with resolution of most of the right effusion noted on prior study. There is also a chest drain at the left base, stable. There is a rather minimal left effusion. There is subsegmental atelectasis in each lung base. Elsewhere lungs are clear. No pneumothorax. Heart is mildly enlarged with pulmonary vascularity within normal limits. No adenopathy. Patient is status post coronary artery bypass grafting.  IMPRESSION: There is a in chest range catheter in each basilar region. Currently minimal bilateral effusions and mild bibasilar atelectasis. Lungs elsewhere clear. Heart is mildly enlarged.   Electronically Signed   By: Lowella Grip III M.D.   On: 08/12/2014 09:44   Dg C-arm 1-60 Min-no Report  08/12/2014   CLINICAL DATA: Pleurx Catheter insertion   C-ARM 1-60 MINUTES  Fluoroscopy was utilized by the requesting physician.  No radiographic  interpretation.        Medications:    Infusions: . sodium chloride 50 mL/hr at 08/12/14 1640    Scheduled Medications: . amLODipine  10 mg Oral Daily  . amoxicillin-clavulanate  1 tablet Oral BID WC  . aspirin EC  325 mg Oral Daily  . atorvastatin  40 mg Oral q1800  . ferrous [id number] C-folic acid  1 capsule Oral Q breakfast  . fluticasone  2 spray Each Nare Daily  . furosemide  40 mg Oral Daily  . hydrALAZINE  75 mg Oral 3 times per day  . insulin aspart  0-9 Units Subcutaneous TID WC  . polyethylene glycol  17 g Oral Daily  . primidone  50 mg Oral Daily  . sodium chloride  1 spray Each Nare TID AC & HS  .  sodium chloride  3 mL Intravenous Q12H  . sodium chloride  3 mL Intravenous Q12H  . traMADol  50 mg Oral Q4H  . traZODone  50 mg Oral QHS    PRN Medications: sodium chloride, acetaminophen, HYDROcodone-acetaminophen, ondansetron **OR** ondansetron (ZOFRAN) IV, sodium chloride   Assessment/ Plan:    Active Problems:   Chronic kidney disease (CKD), stage III (moderate)   HTN (hypertension)   Diabetes mellitus type 2 in obese   S/P CABG x 4   Diastolic CHF, chronic   Exudative pleural effusion  #Post-thoracotomy pleural effusions Breathing continues to improve and drainage decreasing from left Pleurx catheter. We'll await recommendations from cardiothoracic surgery, but patient feels ready to go home this  afternoon. -Drain Pleurx catheters daily as inpatient. Follow-up cardiothoracic recommendations on home drainage. -Possible discharge home this afternoon. -Continue Lasix 40 mg  -Supplemental oxygen to achieve sats >92%. -Home O2 ordered. -Home health PT, OT, and RN ordered. -Heart healthy/carb modified diet. -Continue tramadol 50 mg every 4 hours as needed and Vicodin 5-325 PRN daily when draining catheter. She has pain medications at home, and and she does not need prescriptions at discharge. -Saline lock IV.  #Acute bacterial rhinosinusitis Resolved, completed treatment. -Stop Augmentin. -Continue Flonase and saline nasal spray when necessary.  #Lower extremity edema Stable. -Continue Lasix as above.  #Constipation -MiraLAX daily for prevention.  #CAD s/p CABG -Continue aspirin 325 mg daily. -Continue atorvastatin.  #HTN Normotensive. -Continue amlodipine 10 mg daily, hydralazine 75 mg TID.  #DM2 Glucose at goal. -CBGs ACHS. -SSI-S. -Hold home Lantus.  #Fibromyalgia -Continue primidone and tramadol.   DVT PPX - SCD's while in bed  CODE STATUS - Full  CONSULTS PLACED - Cardiothoracic surgery  DISPO - Likely discharge home this afternoon.  The  patient does have a current PCP Allie Dimmer, MD) and does not need an [facility] hospital follow-up appointment after discharge.    Is the [facility] hospital follow-up appointment a one-time only appointment? N/A.  Does the patient have transportation limitations that hinder transportation to clinic appointments? yes   SERVICE NEEDED AT [place]         Y = Yes, Blank = No PT: Home health  OT: Home health  RN:   Equipment: Oxygen  Other:      Length of Stay: 9 day(s)   Signed: Arman Filter, MD  PGY-1, Internal Medicine Resident Pager: [phone] (7AM-5PM) 08/13/2014, 7:49 AM

## 2014-08-13 NOTE — Progress Notes (Signed)
Medicare Important Message given? YES  (If response is "NO", the following Medicare IM given date fields will be blank)  Date Medicare IM given: 08/13/14 Medicare IM given by:  Eulanda Dorion  

## 2014-08-13 NOTE — Discharge Summary (Signed)
Name: Joyce [name] MRN: [id number] DOB: 1940/11/19 74 y.o. PCP: Allie Dimmer, MD  Date of Admission: 08/04/2014 12:03 AM Date of Discharge: 08/13/2014 Attending Physician: Bartholomew Crews, MD  Discharge Diagnosis:  Principal Problem:   Exudative pleural effusion Active Problems:   Chronic kidney disease (CKD), stage III (moderate)   HTN (hypertension)   Diabetes mellitus type 2 in obese   S/P CABG x 4   Diastolic CHF, chronic   Acute bacterial rhinosinusitis  Discharge Medications:   Medication List    STOP taking these medications        ibuprofen 600 MG tablet  Commonly known as:  ADVIL,MOTRIN     insulin aspart 100 UNIT/ML injection  Commonly known as:  novoLOG      TAKE these medications        acetaminophen 325 MG tablet  Commonly known as:  TYLENOL  Take 650 mg by mouth every 6 (six) hours as needed for moderate pain (pain/fever).     amLODipine 10 MG tablet  Commonly known as:  NORVASC  Take 10 mg by mouth daily.     aspirin 325 MG EC tablet  Take 1 tablet (325 mg total) by mouth daily.     atorvastatin 40 MG tablet  Commonly known as:  LIPITOR  Take 1 tablet (40 mg total) by mouth daily at 6 PM.     ferrous [id number] C-folic acid capsule  Commonly known as:  TRINSICON / FOLTRIN  Take 1 capsule by mouth daily with breakfast. For one month then stop.     furosemide 40 MG tablet  Commonly known as:  LASIX  Take 1 tablet (40 mg total) by mouth daily.     GLUCERNA Liqd  Take 237 mLs by mouth daily.     hydrALAZINE 25 MG tablet  Commonly known as:  APRESOLINE  Take 3 tablets (75 mg total) by mouth every 8 (eight) hours.     insulin glargine 100 UNIT/ML injection  Commonly known as:  LANTUS  Inject 10 Units into the skin at bedtime.     insulin lispro 100 UNIT/ML KiwkPen  Commonly known as:  HUMALOG  Inject 0-12 Units into the skin 3 (three) times daily with meals.     Menthol-Zinc Oxide 0.44-20.625 % Oint  Apply 1  application topically 3 (three) times daily. Apply to buttocks and groin topically every shift for rash     metFORMIN 500 MG tablet  Commonly known as:  GLUCOPHAGE  Take 500 mg by mouth 2 (two) times daily with a meal. 9am, 6pm     polyethylene glycol packet  Commonly known as:  MIRALAX / GLYCOLAX  Take 17 g by mouth daily as needed for mild constipation.     primidone 50 MG tablet  Commonly known as:  MYSOLINE  Take 50 mg by mouth daily.     traMADol 50 MG tablet  Commonly known as:  ULTRAM  Take 1 tablet (50 mg total) by mouth every 6 (six) hours as needed for moderate pain.     traZODone 100 MG tablet  Commonly known as:  DESYREL  Take 100 mg by mouth at bedtime.        Disposition and follow-up:   Joyce [name] was discharged from [facility] in Stable condition.  At the hospital follow up visit please address:  1.  Improvement in shortness of breath, ensure follow up with Dr. Prescott Gum of cardiothoracic surgery.  2.  Labs /  imaging needed at time of follow-up: None.  3.  Pending labs/ test needing follow-up: None.  Follow-up Appointments: Follow-up Information    Follow up with VAN Wilber Oliphant, MD On 08/26/2014.   Specialty:  Cardiothoracic Surgery   Why:  PA/LAT CXR (to be taken at [place] which is in the same building as Dr. Lucianne Lei Trigt's office) on 08/26/2014 at 11:00 am;Appointment with Dr. Prescott Gum is at 12:00 pm   Contact information:   [place] [address] [phone]       Follow up with [facility].   Why:  HH-RN, PT/OT (pleurX cath to be drained M/W/F)   Contact information:   [phone]      Follow up with [place] [facility].   Why:  Home 02 arranged- portable tank to be delivered to room prior to discharge   Contact information:   [address] [place]  [address] [phone]       Follow up with Allie Dimmer, MD On 08/21/2014.    Specialty:  Internal Medicine   Why:  2:15 pm   Contact information:   [place] [address] [id number]       Discharge Instructions: Thank you for allowing [place] to be involved in your healthcare while you were hospitalized at [facility].  Please note that there have been changes to your home medications. --> PLEASE LOOK AT YOUR DISCHARGE MEDICATION LIST FOR DETAILS. Please call your PCP if you have any questions or concerns, or any difficulty getting any of your medications.  Please return to the ER if you have worsening of your symptoms or new severe symptoms arise.  We have arranged for a home health nurse to help drain your catheters. You will need them drained every day until the end of next week. After that, they will continue to drain the catheters every other day until you follow up with Dr. Prescott Gum. Discharge Instructions    Call MD for:  difficulty breathing, headache or visual disturbances    Complete by:  As directed      Call MD for:  extreme fatigue    Complete by:  As directed      Call MD for:  persistant dizziness or light-headedness    Complete by:  As directed      Call MD for:  persistant nausea and vomiting    Complete by:  As directed      Call MD for:  redness, tenderness, or signs of infection (pain, swelling, redness, odor or green/yellow discharge around incision site)    Complete by:  As directed      Call MD for:  severe uncontrolled pain    Complete by:  As directed      Call MD for:  temperature >100.4    Complete by:  As directed      Diet - low sodium heart healthy    Complete by:  As directed      Increase activity slowly    Complete by:  As directed            Consultations: Cardiothoracic Surgery.  Procedures Performed:  Dg Chest 1 View  07/30/2014   CLINICAL DATA:  Status post right-sided thoracentesis  EXAM: CHEST  1 VIEW  COMPARISON:  July 30, 2014 study obtained earlier in the day  FINDINGS: No  demonstrable pneumothorax. Right pleural effusion is slightly smaller. There is consolidation with small  effusion on the left. There is a small effusion on the right with mild atelectasis. Heart is mildly enlarged with pulmonary vascularity within normal limits. Patient is status post coronary artery bypass grafting. No adenopathy.  IMPRESSION: No pneumothorax. Less effusion on the right. Persistent left lower lobe consolidation with small effusion. Heart prominent but stable.   Electronically Signed   By: Lowella Grip M.D.   On: 07/30/2014 15:21   Dg Chest 1 View  07/28/2014   CLINICAL DATA:  Post left-sided thoracentesis.  EXAM: CHEST - 1 VIEW  COMPARISON:  07/28/2014 at 6:12 a.m.  FINDINGS: Decreased left pleural effusion post thoracentesis. No postoperative air leak.  Unchanged small to moderate right pleural effusion from earlier today. Bibasilar atelectasis is also unchanged. Stable heart size and mediastinal contours post CABG. No pulmonary edema noted in the upper lungs.  IMPRESSION: No postoperative pneumothorax.   Electronically Signed   By: Jorje Guild M.D.   On: 07/28/2014 13:53   Dg Chest 1 View  07/27/2014   CLINICAL DATA:  Right thoracentesis.  EXAM: CHEST - 1 VIEW  COMPARISON:  07/27/2014 at 7:33 a.m.  FINDINGS: Decreased right pleural effusion post thoracentesis. No air leak or re-expansion pulmonary edema. Unchanged large left pleural effusion with extensive atelectasis, reference chest CT from 1 day prior. No pulmonary edema.  Stable cardiomegaly and aortic contours. The patient is status post CABG.  IMPRESSION: No evidence of complication post right thoracentesis.   Electronically Signed   By: Jorje Guild M.D.   On: 07/27/2014 12:08   Dg Chest 2 View  08/11/2014   CLINICAL DATA:  Pleural effusions, CHF, diabetes.  EXAM: CHEST  2 VIEW  COMPARISON:  Portable chest x-ray of June 07, 2015  FINDINGS: The small caliber left-sided chest tube remains in position with its tip lying  posteriorly and inferiorly. No significant left pleural effusion is demonstrated. There spur left basilar atelectasis. On the right there is a moderate-sized effusion as well as middle and lower lobe atelectasis or infiltrate. The cardiac silhouette is top-normal to mildly enlarged. The pulmonary vascularity is not engorged. There are 7 intact sternal wires from previous CABG. The bony thorax exhibits no acute abnormality.  IMPRESSION: 1. Persistent moderate-sized right pleural effusion with is right basilar atelectasis or pneumonia. 2. On the left there is only a tiny pleural effusion remaining given the presence of the small caliber chest tube. There is also mild left lower lobe atelectasis.   Electronically Signed   By: David  Martinique   On: 08/11/2014 09:11   Dg Chest 2 View  08/07/2014   CLINICAL DATA:  Shortness of breath.  EXAM: CHEST  2 VIEW  COMPARISON:  None.  FINDINGS: Mediastinum hilar structures are normal. Prior CABG. Heart size normal. Bibasilar atelectasis and/or infiltrates with bilateral effusions. No acute bony abnormality. Prior cervical spine fusion.  IMPRESSION: 1. Bibasilar atelectasis and/or infiltrates with small bilateral effusions. No change from prior exam.  2.  Prior CABG.  Heart size stable.  No pulmonary venous congestion.   Electronically Signed   By: Marcello Moores  Register   On: 08/07/2014 08:07   Dg Chest 2 View  08/06/2014   CLINICAL DATA:  Shortness of breath.  EXAM: CHEST  2 VIEW  COMPARISON:  08/04/2014.  FINDINGS: Mediastinum and hilar structures are normal. Heart size stable. Prior CABG. Bibasilar pulmonary infiltrates and bilateral effusions are noted. Bibasilar infiltrates increased from prior exam. No pneumothorax. Prior cervical spine fusion.  IMPRESSION: 1. Interim increase in bibasilar pulmonary  infiltrates. 2. Persistent bilateral pleural effusions. 3.  Prior CABG.  Heart size stable.  Pulmonary vascularity normal.   Electronically Signed   By: Marcello Moores  Register   On:  08/06/2014 07:33   Dg Chest 2 View  08/04/2014   CLINICAL DATA:  Shortness of Breath  EXAM: CHEST  2 VIEW  COMPARISON:  August 03, 2014  FINDINGS: There is patchy consolidation in both lung bases with pleural effusions bilaterally. Lungs elsewhere clear. Heart is borderline prominent with pulmonary vascularity within normal limits. Patient is status post coronary artery bypass grafting. No adenopathy. No pneumothorax. There is postoperative change in the lower cervical spine.  IMPRESSION: Patchy bibasilar consolidation with pleural effusions bilaterally. No appreciable change from 1 day prior.   Electronically Signed   By: Lowella Grip III M.D.   On: 08/04/2014 08:17   Dg Chest 2 View  07/30/2014   CLINICAL DATA:  Short of breath, history of pleural effusions  EXAM: CHEST  2 VIEW  COMPARISON:  Chest x-ray of 07/29/2014  FINDINGS: Aeration of the lungs has improved slightly. Bibasilar opacities remain consistent with atelectasis and effusions. Pneumonia cannot be excluded particularly at the left lung base. Mild cardiomegaly is stable. Median sternotomy sutures are noted from CABG.  IMPRESSION: Slightly better aeration. Persistent bibasilar opacities consistent with atelectasis and effusions. Cannot exclude pneumonia particularly at the left lung base.   Electronically Signed   By: Ivar Drape M.D.   On: 07/30/2014 08:10   Dg Chest 2 View  07/29/2014   CLINICAL DATA:  F/u eval post open heart surgery and issues with pleural effusion - pt states no issues with SOB or CP today  EXAM: CHEST - 2 VIEW  COMPARISON:  the previous day's study  FINDINGS: Previous CABG. Persistent bilateral pleural effusions. Patchy airspace opacities in both lung bases, slightly increased on the left since prior study. Heart size within normal limits for technique. Cervical fixation hardware noted. Spurring in the lower thoracic spine.  IMPRESSION: 1. Persistent bilateral pleural effusions. 2. Worsening left lower lobe airspace  disease.   Electronically Signed   By: Arne Cleveland M.D.   On: 07/29/2014 08:40   Dg Chest 2 View  07/28/2014   CLINICAL DATA:  Difficulty breathing  EXAM: CHEST  2 VIEW  COMPARISON:  July 27, 2014  FINDINGS: There is bibasilar airspace consolidation with bilateral effusions. Lungs elsewhere clear. The heart size and pulmonary vascularity are normal. No adenopathy. Patient is status post coronary artery bypass grafting. There is postoperative change in the lower cervical spine region. There is degenerative change in the lower thoracic and upper lumbar spine regions.  IMPRESSION: Bibasilar lung consolidations and small pleural effusions bilaterally. No pneumothorax. There is slightly greater consolidation in the right base compared to 1 day prior. Left hemithorax appears essentially stable.   Electronically Signed   By: Lowella Grip M.D.   On: 07/28/2014 07:40   X-ray Chest Pa And Lateral  07/27/2014   CLINICAL DATA:  74 year old female with shortness of breath and clinical history of recent heart surgery.  EXAM: CHEST  2 VIEW  COMPARISON:  Prior chest x-ray and chest CT scan 07/26/2014  FINDINGS: Stable cardiac and mediastinal contours. Patient is status post median sternotomy with evidence of multivessel CABG. There has been no significant interval change in the appearance of the chest. Persistent bilateral layering pleural effusions and associated bibasilar atelectasis. No overt pulmonary edema. No pneumothorax. Incompletely imaged anterior cervical fusion hardware. No acute osseous abnormality.  IMPRESSION:  1. Stable bilateral layering pleural effusions and associated bibasilar atelectasis. 2. No evidence of pulmonary edema/CHF or other significant interval change.   Electronically Signed   By: Jacqulynn Cadet M.D.   On: 07/27/2014 07:55   Dg Chest 2 View  07/15/2014   CLINICAL DATA:  Subsequent encounter for postop CABG with slight cough and shortness of breath.  EXAM: CHEST  2 VIEW   COMPARISON:  06/18/2014  FINDINGS: There is persistent bibasilar atelectasis with small bilateral pleural effusions. The cardio pericardial silhouette is enlarged. Imaged bony structures of the thorax are intact. Patient is status post CABG.  IMPRESSION: Persistent but decreased bibasilar atelectasis with small bilateral pleural effusions.   Electronically Signed   By: Misty Stanley M.D.   On: 07/15/2014 11:36   Ct Angio Chest Pe W/cm &/or Wo Cm  07/26/2014   CLINICAL DATA:  Chest pain, shortness of breath.  EXAM: CT ANGIOGRAPHY CHEST WITH CONTRAST  TECHNIQUE: Multidetector CT imaging of the chest was performed using the standard protocol during bolus administration of intravenous contrast. Multiplanar CT image reconstructions and MIPs were obtained to evaluate the vascular anatomy.  CONTRAST:  36mL OMNIPAQUE IOHEXOL 350 MG/ML SOLN  COMPARISON:  Chest radiograph of same day.  FINDINGS: Large bilateral pleural effusions are noted with associated atelectasis of both lower lobes. No pneumothorax is noted. Mild opacity is noted in the left upper lobe concerning for subsegmental atelectasis. Status post coronary artery bypass graft. There is no evidence of thoracic aortic dissection. Ascending aorta is mildly dilated at 3.8 cm. There is no definite evidence of pulmonary embolus. No significant mediastinal mass or adenopathy is noted. No significant osseous abnormality is noted.  Review of the MIP images confirms the above findings.  IMPRESSION: No definite evidence of pulmonary embolus.  Large bilateral pleural effusions are noted with associated atelectasis of both lower lobes.  Mild dilatation of ascending thoracic aorta measured at 3.8 cm.   Electronically Signed   By: Sabino Dick M.D.   On: 07/26/2014 14:13   Dg Chest Port 1 View  08/12/2014   CLINICAL DATA:  PleurX catheter placement  EXAM: DG C-ARM 1-60 MIN - NRPT [facility]; PORTABLE CHEST - 1 VIEW  COMPARISON:  August 11, 2014  FINDINGS: There is a chest drain  in the right base with resolution of most of the right effusion noted on prior study. There is also a chest drain at the left base, stable. There is a rather minimal left effusion. There is subsegmental atelectasis in each lung base. Elsewhere lungs are clear. No pneumothorax. Heart is mildly enlarged with pulmonary vascularity within normal limits. No adenopathy. Patient is status post coronary artery bypass grafting.  IMPRESSION: There is a in chest range catheter in each basilar region. Currently minimal bilateral effusions and mild bibasilar atelectasis. Lungs elsewhere clear. Heart is mildly enlarged.   Electronically Signed   By: Lowella Grip III M.D.   On: 08/12/2014 09:44   Dg Chest Port 1 View  08/07/2014   CLINICAL DATA:  Postop for left pleural catheter placement.  EXAM: PORTABLE CHEST - 1 VIEW; DG C-ARM 1-60 MIN - NRPT [facility]  COMPARISON:  06/07/2015  FINDINGS: There is a radiopaque pleural drain at the left chest base. Improved aeration at the left lung base with residual atelectasis. There continues to be right basilar densities compatible with a moderate right pleural effusion and associated atelectasis. There is no evidence for a pneumothorax. Heart size remains upper limits of normal. Surgical changes compatible with  cervical spine surgery and CABG procedure.  IMPRESSION: Placement of a left pleural drain with improved aeration at the left lung base. No evidence for pneumothorax.  Right basilar densities are compatible with right pleural fluid and atelectasis.   Electronically Signed   By: Markus Daft M.D.   On: 08/07/2014 15:38   Dg Chest Port 1 View  07/26/2014   CLINICAL DATA:  SOB x 3 weeks; worsening greatly in the last 2 days. Hx of CABG June 16, 2014.  EXAM: PORTABLE CHEST - 1 VIEW  COMPARISON:  07/15/2014  FINDINGS: Bilateral effusions and lung base opacity, most likely atelectasis, have mildly increased from the prior exam. No pulmonary edema.  Changes from CABG surgery are  stable. Cardiac silhouette is mostly obscured by the lung base opacity. No mediastinal or hilar masses.  No pneumothorax.  IMPRESSION: 1. Mild increase in bilateral pleural effusions and associated lung base atelectasis. No pulmonary edema.   Electronically Signed   By: Lajean Manes M.D.   On: 07/26/2014 11:03   Dg C-arm 1-60 Min-no Report  08/12/2014   CLINICAL DATA: Pleurx Catheter insertion   C-ARM 1-60 MINUTES  Fluoroscopy was utilized by the requesting physician.  No radiographic  interpretation.    Dg C-arm 1-60 Min-no Report  08/07/2014   CLINICAL DATA:  Postop for left pleural catheter placement.  EXAM: PORTABLE CHEST - 1 VIEW; DG C-ARM 1-60 MIN - NRPT [facility]  COMPARISON:  06/07/2015  FINDINGS: There is a radiopaque pleural drain at the left chest base. Improved aeration at the left lung base with residual atelectasis. There continues to be right basilar densities compatible with a moderate right pleural effusion and associated atelectasis. There is no evidence for a pneumothorax. Heart size remains upper limits of normal. Surgical changes compatible with cervical spine surgery and CABG procedure.  IMPRESSION: Placement of a left pleural drain with improved aeration at the left lung base. No evidence for pneumothorax.  Right basilar densities are compatible with right pleural fluid and atelectasis.   Electronically Signed   By: Markus Daft M.D.   On: 08/07/2014 15:38   US Thoracentesis Asp Pleural Space W/img Guide  07/30/2014   CLINICAL DATA:  Recurrent pleural effusions. Request therapeutic thoracentesis  EXAM: ULTRASOUND GUIDED RIGHT THORACENTESIS  COMPARISON:  Previous thoracentesis performed 2/1(right) and 2/2(left)  PROCEDURE: An ultrasound guided thoracentesis was thoroughly discussed with the patient and questions answered. The benefits, risks, alternatives and complications were also discussed. The patient understands and wishes to proceed with the procedure. Written consent was obtained.   Ultrasound of bilateral chest demonstrates bilateral moderate pleural effusions. Right side is chosen for thoracentesis.  Ultrasound was performed to localize and mark an adequate pocket of fluid in the right chest. The area was then prepped and draped in the normal sterile fashion. 1% Lidocaine was used for local anesthesia. Under ultrasound guidance a 19 gauge Yueh catheter was introduced. Thoracentesis was performed. The catheter was removed and a dressing applied.  Complications:  None  FINDINGS: A total of approximately 1.3 L of clear yellow fluid was removed. A fluid sample was notsent for laboratory analysis.  IMPRESSION: Successful ultrasound guided right thoracentesis yielding 1.3 L of pleural fluid.  Read by: Ascencion Dike PA-C   Electronically Signed   By: Markus Daft M.D.   On: 07/30/2014 14:51   US Thoracentesis Asp Pleural Space W/img Guide  07/28/2014   CLINICAL DATA:  Shortness of breath, left-sided pleural effusion. Request diagnostic and therapeutic thoracentesis.  EXAM:  ULTRASOUND GUIDED LEFT THORACENTESIS  COMPARISON:  Right thoracentesis performed 07/27/2014  PROCEDURE: An ultrasound guided thoracentesis was thoroughly discussed with the patient and questions answered. The benefits, risks, alternatives and complications were also discussed. The patient understands and wishes to proceed with the procedure. Written consent was obtained.  Ultrasound was performed to localize and mark an adequate pocket of fluid in the left chest. The area was then prepped and draped in the normal sterile fashion. 1% Lidocaine was used for local anesthesia. Under ultrasound guidance a 19 gauge Yueh catheter was introduced. Thoracentesis was performed. The catheter was removed and a dressing applied.  COMPLICATIONS: None.  Postprocedural chest x-ray negative for pneumothorax.  The patient did develop significant cough reflex and procedure had to be terminated prematurely.  FINDINGS: A total of approximately 1 L of  clear, amber colored fluid was removed. A fluid sample wassent for laboratory analysis.  IMPRESSION: Successful ultrasound guided left thoracentesis yielding 1 L of pleural fluid.  Read by: Ascencion Dike PA-C   Electronically Signed   By: Markus Daft M.D.   On: 07/28/2014 14:02   US Thoracentesis Asp Pleural Space W/img Guide  07/27/2014   INDICATION: Symptomatic bilateral pleural effusions, request for thoracentesis.  EXAM: US THORACENTESIS ASP PLEURAL SPACE W/IMG GUIDE  COMPARISON:  07/26/14.  MEDICATIONS: None  COMPLICATIONS: None immediate  TECHNIQUE: Informed written consent was obtained from the patient after a discussion of the risks, benefits and alternatives to treatment. A timeout was performed prior to the initiation of the procedure.  Initial ultrasound scanning demonstrates a right pleural effusion. The lower chest was prepped and draped in the usual sterile fashion. 1% lidocaine was used for local anesthesia.  Under direct ultrasound guidance, a 19 gauge, 10-cm, Yueh catheter was introduced. An ultrasound image was saved for documentation purposes. The thoracentesis was performed. The catheter was removed and a dressing was applied. The patient tolerated the procedure well without immediate post procedural complication. The patient was escorted to have an upright chest radiograph.  FINDINGS: A total of approximately 1 liters of serous fluid was removed.  IMPRESSION: Successful ultrasound-guided right sided thoracentesis yielding 1 liters of pleural fluid.  Read By:  Tsosie Billing PA-C   Electronically Signed   By: Daryll Brod M.D.   On: 07/27/2014 14:32   Admission HPI:  74 yo female with DM II, HTN, CKD III presents, CAD s/p CABG 05/2014, recent pleural effusion thought to be post-thoracotomy pleural effusion s/p thoracentesis, discharged on 07/31/14 comes here from home with worsening SOB. Presented to [facility], CXR showing increased pleural effusion. SOB has improved with O2 per  patient. She is not able to lie down and not feeling comfortable because of the SOB. Patient also has b/l leg edema which started after discharge. She was discharged without lasix as her kidney function worsened with lasix. Denies any chest pain, fever, chills. Has dry cough with some runny nose. Denies diarrhea, n/v/ or any other complaint.   Hospital Course by problem list: Principal Problem:   Exudative pleural effusion Active Problems:   Chronic kidney disease (CKD), stage III (moderate)   HTN (hypertension)   Diabetes mellitus type 2 in obese   S/P CABG x 4   Diastolic CHF, chronic   Acute bacterial rhinosinusitis   #Post-thoracotomy pleural effusions She was recently hospitalized with bilateral pleural effusions, which were determined to be exudative and secondary to her previous CABG. She was discharged home on 07/29/2014 following therapeutic thoracentesis on ibuprofen to help  prevent recurrence of the infusions.  She presented with recurrent shortness of breath, and she was found to have recurrent bilateral effusions. Cardiothoracic surgery was consulted and attempted diuresis with Lasix and spironolactone along with prednisone to reduce inflammation, but minimal improvement was observed. Dr. Prescott Gum ultimately placed bilateral Pleurx catheters with improvement of her shortness of breath. She continued to have significant fluid output until discharge, so a home health nurse was arranged to drain the catheters daily for the next week and a half followed by every other day until her follow-up appointment with cardiothoracic surgery. She desaturated to 87% on room air with ambulation prior to discharge, so she was given home health oxygen. Home health PT and OT were also arranged.  #Lower extremity edema Her home Lasix was held at her previous discharge due to acute kidney injury from overdiuresis. She had been noted to have grade 1 diastolic dysfunction with normal ejection fraction on echo  during her previous hospitalization.  On presentation, she had bilateral 2+ pitting edema, and her creatinine had returned to baseline. She was restarted on her previous dose of Lasix 40 mg daily with resolution of her lower extremity edema, and this was continued on discharge.  #Acute bacterial rhinosinusitis She reported upper respiratory symptoms on presentation, and she was initially managed supportively with Flonase and saline nasal spray. However, approximately 5 days after admission, she developed an increase in the amount and purulence of her nasal discharge. She was treated with 5 days of Augmentin with resolution of her symptoms.  #Coronary artery disease s/p CABG She was continued on her home aspirin 325 mg daily and atorvastatin during the hospitalization. She may benefit from restarting an ACE inhibitor in the future now that her kidney function has improved.  #Type 2 diabetes She was maintained on sensitive sliding scale insulin with good blood sugar control during the hospitalization. Her home insulin regimen of Lantus 10 units daily at bedtime and Humalog sliding scale with meals was resumed at discharge.  Discharge Vitals:   BP 122/56 mmHg  Pulse 88  Temp(Src) 98.2 F (36.8 C) (Oral)  Resp 18  Ht 5\' 5"  (1.651 m)  Wt 183 lb 3.2 oz (83.1 kg)  BMI 30.49 kg/m2  SpO2 99%  Discharge Labs:  Results for orders placed or performed during the hospital encounter of 08/04/14 (from the past 24 hour(s))  Glucose, capillary     Status: Abnormal   Collection Time: 08/12/14 11:54 AM  Result Value Ref Range   Glucose-Capillary 128 (H) 70 - 99 mg/dL  Glucose, capillary     Status: Abnormal   Collection Time: 08/12/14  4:21 PM  Result Value Ref Range   Glucose-Capillary 146 (H) 70 - 99 mg/dL  Glucose, capillary     Status: Abnormal   Collection Time: 08/12/14  9:16 PM  Result Value Ref Range   Glucose-Capillary 220 (H) 70 - 99 mg/dL  Glucose, capillary     Status: Abnormal    Collection Time: 08/13/14  6:55 AM  Result Value Ref Range   Glucose-Capillary 142 (H) 70 - 99 mg/dL  Glucose, capillary     Status: Abnormal   Collection Time: 08/13/14 11:18 AM  Result Value Ref Range   Glucose-Capillary 189 (H) 70 - 99 mg/dL    Signed: Arman Filter, MD 08/13/2014, 11:41 AM    Services Ordered on Discharge: Home health RN, PT, and OT. Equipment Ordered on Discharge: Home oxygen.

## 2014-08-13 NOTE — Progress Notes (Signed)
Physical Therapy Treatment Patient Details Name: Joyce [name] MRN: [id number] DOB: 1941-06-26 Today's Date: 08/13/2014    History of Present Illness Joyce [name] is a 74 yo female with DM II, HTN, CKD III presents, CAD s/p CABG 05/2014. She was admitted in Jan for B post-thoracotomy pleural effusion. She had failed to respond to diuresis and developed Acute pre-renal failure. She was then treated with repeat thoracentesis, showing exudative effusions, and NSAIDS. She was discharged on 07/31/14. Since D/C, she had slowly worsening dyspnea and was too bad that she presented to ED and found to have reaccumulated B pleural effusions.     PT Comments    Patient is progressing well towards physical therapy goals, ambulating up to 225 feet with supervision. Safely completed stair training. SpO2 down to 88% on room air, improves with standing rest break and pursed lip breathing to 92% and greater. Patient will continue to benefit from skilled physical therapy services at home with HHPT to further improve independence with functional mobility.    Follow Up Recommendations  Home health PT     Equipment Recommendations  None recommended by PT    Recommendations for Other Services       Precautions / Restrictions Precautions Precautions: Sternal Precaution Comments: pt is knowledgeable in sternal precautions Restrictions Weight Bearing Restrictions: No Other Position/Activity Restrictions: sternal precautions.    Mobility  Bed Mobility               General bed mobility comments: Sitting EOB at start of therapy  Transfers Overall transfer level: Modified independent                  Ambulation/Gait Ambulation/Gait assistance: Supervision Ambulation Distance (Feet): 225 Feet Assistive device: Rolling walker (2 wheeled) Gait Pattern/deviations: Step-through pattern;Decreased stride length Gait velocity: decreased   General Gait Details: Ambulating on room air, with drop  to 88%, improves with pursed lip breathing to 92%. HR in 80s. States she feels confident with use of RW for support. No loss of balance noted. Cues to keep walker within base of support.   Stairs Stairs: Yes Stairs assistance: Supervision Stair Management: One rail Right;Forwards;Alternating pattern Number of Stairs: 3 General stair comments: Educated on safe stair navigation with use of single rail similar to home environtment. Able to complete without physical assist and states she feels comfortable with this task.  Wheelchair Mobility    Modified Rankin (Stroke Patients Only)       Balance                                    Cognition Arousal/Alertness: Awake/alert Behavior During Therapy: WFL for tasks assessed/performed Overall Cognitive Status: Within Functional Limits for tasks assessed                      Exercises General Exercises - Lower Extremity Ankle Circles/Pumps: AROM;Both;10 reps;Seated Long Arc Quad: Strengthening;Both;10 reps;Seated Hip Flexion/Marching: Strengthening;Both;10 reps;Seated    General Comments General comments (skin integrity, edema, etc.): placed on 1.5L supplemental O2 at end of therapy session with SpO2 94%      Pertinent Vitals/Pain Pain Assessment: 0-10 Pain Score:  ("doing great today" no value given) Pain Intervention(s): Monitored during session;Repositioned    Home Living                      Prior Function  PT Goals (current goals can now be found in the care plan section) Acute Rehab PT Goals Patient Stated Goal: to get stronger PT Goal Formulation: With patient Time For Goal Achievement: 08/19/14 Potential to Achieve Goals: Good Progress towards PT goals: Progressing toward goals    Frequency  Min 3X/week    PT Plan Current plan remains appropriate    Co-evaluation             End of Session Equipment Utilized During Treatment: Gait belt Activity Tolerance:  Patient tolerated treatment well Patient left: in bed;with call bell/phone within reach (sitting EOB eating lunch)     Time: [id number] PT Time Calculation (min) (ACUTE ONLY): 15 min  Charges:  $Gait Training: 8-22 mins                    G Codes:      Joyce [name] 08/22/2014, 12:47 PM Joyce [name] [place], [place]

## 2014-08-13 NOTE — Progress Notes (Signed)
  Date: 08/13/2014  Patient name: Joyce [name]  Medical record number: [id number]  Date of birth: 31-Aug-1940   This patient has been seen and the plan of care was discussed with the house staff. Please see their note for complete details. I concur with their findings with the following additions/corrections: Feels better. Less dyspnic. Walked to nurses desk last PM but was tired from procedure. Ready for D/C home today.  Bartholomew Crews, MD 08/13/2014, 11:13 AM

## 2014-08-13 NOTE — Progress Notes (Addendum)
      [address]       [place] [address]             [phone]      1 Day Post-Op Procedure(s) (LRB): INSERTION PLEURAL DRAINAGE CATHETER (Right)   Subjective:  Joyce [name] has no complaints.  She is hoping she can go home today.  Objective: Vital signs in last 24 hours: Temp:  [98.1 F (36.7 C)-98.4 F (36.9 C)] 98.2 F (36.8 C) (02/18 0659) Pulse Rate:  [86-103] 88 (02/18 0659) Cardiac Rhythm:  [-] Normal sinus rhythm (02/17 2020) Resp:  [5-22] 18 (02/18 0659) BP: (119-140)/(53-71) 122/56 mmHg (02/18 0659) SpO2:  [94 %-100 %] 99 % (02/18 0659) Weight:  [183 lb 3.2 oz (83.1 kg)] 183 lb 3.2 oz (83.1 kg) (02/18 0659)  Intake/Output from previous day: 02/17 0701 - 02/18 0700 In: 1126.7 [P.O.:360; I.V.:766.7] Out: 900 [Urine:900]  General appearance: alert, cooperative and no distress Heart: regular rate and rhythm Lungs: diminished breath sounds bibasilar Abdomen: soft, non-tender; bowel sounds normal; no masses,  no organomegaly Wound: clean and dry  Lab Results: No results for input(s): WBC, HGB, HCT, PLT in the last 72 hours. BMET:  Recent Labs  08/12/14 0548  NA 136  K 4.1  CL 103  CO2 28  GLUCOSE 146*  BUN 19  CREATININE 1.42*  CALCIUM 8.6    PT/INR: No results for input(s): LABPROT, INR in the last 72 hours. ABG    Component Value Date/Time   PHART 7.295* 06/15/2014 2013   HCO3 19.7* 06/15/2014 2013   TCO2 18 06/16/2014 1712   ACIDBASEDEF 6.0* 06/15/2014 2013   O2SAT 97.0 06/15/2014 2013   CBG (last 3)   Recent Labs  08/12/14 1621 08/12/14 2116 08/13/14 0655  GLUCAP 146* 220* 142*    Assessment/Plan: S/P Procedure(s) (LRB): INSERTION PLEURAL DRAINAGE CATHETER (Right)  1. Patient stable, both left and right pleur-x catheter in place- will need to drain catheter daily for the remainder of this week and all of next week.  Then she can proceed to every other day drainage   LOS: 9 days    Ahmed Prima,  ERIN 08/13/2014  patient examined and medical record reviewed,agree with above note. [name],Levelle Edelen 08/13/2014

## 2014-08-26 ENCOUNTER — Ambulatory Visit: Payer: Medicare Other | Admitting: Cardiothoracic Surgery

## 2014-08-26 ENCOUNTER — Ambulatory Visit (INDEPENDENT_AMBULATORY_CARE_PROVIDER_SITE_OTHER): Payer: Medicare Other | Admitting: Cardiothoracic Surgery

## 2014-08-26 ENCOUNTER — Encounter: Payer: Self-pay | Admitting: Cardiothoracic Surgery

## 2014-08-26 ENCOUNTER — Ambulatory Visit
Admission: RE | Admit: 2014-08-26 | Discharge: 2014-08-26 | Disposition: A | Discharge status: Home / Self Care | Payer: Medicare Other | Source: Ambulatory Visit | Attending: Cardiothoracic Surgery | Admitting: Cardiothoracic Surgery

## 2014-08-26 VITALS — BP 157/78 | HR 87 | Resp 16 | Ht 65.0 in | Wt 181.6 lb

## 2014-08-26 DIAGNOSIS — Z951 Presence of aortocoronary bypass graft: Secondary | ICD-10-CM

## 2014-08-26 DIAGNOSIS — J948 Other specified pleural conditions: Secondary | ICD-10-CM

## 2014-08-26 DIAGNOSIS — J90 Pleural effusion, not elsewhere classified: Secondary | ICD-10-CM

## 2014-08-26 NOTE — Progress Notes (Signed)
PCP is Allie Dimmer, MD Referring Provider is Bartholomew Crews, MD  Chief Complaint  Patient presents with  . Routine Post Op    4 week f/u with CXR, bilateral pleural effusions s/p  Placement of right PleurX catheter 08/12/14    HPI:the patient returns for her first postop visit after bilateral Pleurx catheter placement for bilateral pleural effusions status post CABG with chronic renal insufficiency and diastolic heart failure. Cytology pleural effusions is negative for malignancy. Home health nurses are draining every other day. Drainage is typically 250 cc each side. Fluid is clear yellow. No drainage around the catheter exit site.  I personally examined  and each catheter exit site, removed the insertion incision skin sutures, and drained Pleurx catheters in the office today. Exit sites are clean and dry. Insertion sites are healing. 250 mL is removed from each side.   Past Medical History  Diagnosis Date  . Hypertension   . Anginal pain   . Acute on chronic diastolic heart failure [id number]  . Pleural effusion 07/26/2014    Post-operative - bilateral   . Complication of anesthesia     "one of the RX's they use for anesthesia makes me talk crazy"   . High cholesterol   . Type II diabetes mellitus   . Pneumonia 1990's X 1  . Anemia   . Fibromyalgia   . Arthritis     "right hip; hands" (08/04/2014)  . Chronic lower back pain   . CHF (congestive heart failure)   . Chronic kidney disease (CKD), stage III (moderate)   . Bilateral pleural effusion 08/04/2014    Past Surgical History  Procedure Laterality Date  . Left heart catheterization with coronary angiogram N/A 06/11/2014    Procedure: LEFT HEART CATHETERIZATION WITH CORONARY ANGIOGRAM;  Surgeon: Blane Ohara, MD;  [facility] CATH LAB;  Service: Cardiovascular;  Laterality: N/A;  . Coronary artery bypass graft N/A 06/15/2014    Procedure: CORONARY ARTERY BYPASS GRAFTING (CABG);  Surgeon: Ivin Poot, MD;   Location: [facility];  Service: Open Heart Surgery;  Laterality: N/A;  . Intraoperative transesophageal echocardiogram N/A 06/15/2014    Procedure: INTRAOPERATIVE TRANSESOPHAGEAL ECHOCARDIOGRAM;  Surgeon: Ivin Poot, MD;  Location: [facility];  Service: Open Heart Surgery;  Laterality: N/A;  . Tonsillectomy  ~ 1950  . Cholecystectomy  ~ 2005  . Abdominal hysterectomy  1980's  . Cardiac catheterization    . Appendectomy  1970's  . Back surgery    . Lumbar disc surgery  1980's X 1; 1990's X  2000's X 1  . Anterior cervical decomp/discectomy fusion  2000's  . Chest tube insertion Left 08/07/2014    Procedure: INSERTION PLEURAL DRAINAGE CATHETER;  Surgeon: Ivin Poot, MD;  Location: [facility];  Service: Thoracic;  Laterality: Left;  . Chest tube insertion Right 08/12/2014    Procedure: INSERTION PLEURAL DRAINAGE CATHETER;  Surgeon: Ivin Poot, MD;  Location: [facility];  Service: Thoracic;  Laterality: Right;    No family history on file.  Social History History  Substance Use Topics  . Smoking status: Never Smoker   . Smokeless tobacco: Never Used  . Alcohol Use: No    Current Outpatient Prescriptions  Medication Sig Dispense Refill  . acetaminophen (TYLENOL) 325 MG tablet Take 650 mg by mouth every 6 (six) hours as needed for moderate pain (pain/fever).     Marland Kitchen amLODipine (NORVASC) 10 MG tablet Take 10 mg by mouth daily.   5  . aspirin EC 325  MG EC tablet Take 1 tablet (325 mg total) by mouth daily. 30 tablet 0  . atorvastatin (LIPITOR) 40 MG tablet Take 1 tablet (40 mg total) by mouth daily at 6 PM.    . ferrous [id number] C-folic acid (TRINSICON / FOLTRIN) capsule Take 1 capsule by mouth daily with breakfast. For one month then stop.    . furosemide (LASIX) 40 MG tablet Take 1 tablet (40 mg total) by mouth daily. 30 tablet 0  . GLUCERNA (GLUCERNA) LIQD Take 237 mLs by mouth daily.    . hydrALAZINE (APRESOLINE) 25 MG tablet Take 3 tablets (75 mg total) by mouth every 8 (eight)  hours. (Patient taking differently: Take 75 mg by mouth daily. )    . insulin glargine (LANTUS) 100 UNIT/ML injection Inject 10 Units into the skin at bedtime.    . insulin lispro (HUMALOG) 100 UNIT/ML KiwkPen Inject 0-12 Units into the skin 3 (three) times daily with meals.     . Menthol-Zinc Oxide 0.44-20.625 % OINT Apply 1 application topically 3 (three) times daily. Apply to buttocks and groin topically every shift for rash    . metFORMIN (GLUCOPHAGE) 500 MG tablet Take 500 mg by mouth 2 (two) times daily with a meal. 9am, 6pm    . polyethylene glycol (MIRALAX / GLYCOLAX) packet Take 17 g by mouth daily as needed for mild constipation. 14 each 2  . primidone (MYSOLINE) 50 MG tablet Take 50 mg by mouth daily.   5  . traMADol (ULTRAM) 50 MG tablet Take 1 tablet (50 mg total) by mouth every 6 (six) hours as needed for moderate pain. (Patient taking differently: Take 50 mg by mouth every 4 (four) hours as needed for moderate pain or severe pain. ) 30 tablet 0  . traZODone (DESYREL) 100 MG tablet Take 100 mg by mouth at bedtime.   0   No current facility-administered medications for this visit.    Allergies  Allergen Reactions  . Carvedilol Other (See Comments)    confusion  . Codeine Other (See Comments)    confusion  . Metoprolol Palpitations    Review of Systems   Improved shortness of breath after placement of Pleurx catheters. She's done using home oxygen. No recurrent angina after CABG Surgical incisions healing well no skin rash or lesions from adhesive dress No nausea vomiting or reduced appetite No syncope headache or change in vision   BP 157/78 mmHg  Pulse 87  Resp 16  Ht 5\' 5"  (1.651 m)  Wt 181 lb 9.6 oz (82.373 kg)  BMI 30.22 kg/m2  SpO2 97%   Physical Exam General alert and oriented, comfortable, accompanied by friend HEENT-normocephalic pupils equal conjunctiva pink Neck-no JVD, supple, no adenopathy Thorax-clear breath sounds bilaterally. Pleurx catheter  incisions healing well Cardiac-regular rhythm without murmur or rub Abdomen-soft obese nontender Extremities-no edema tenderness cyanosis Skin-no rash or cellulitis-inflammation from adhesive dressings over Pleurx catheters Neurologic-alert and oriented no focal motor deficit, normal gait  Diagnostic Tests: Chest x-ray-clear lung fields, Pleurx catheters in good position, no significant pleural effusions  Impression: Status post CABG with recurrent bilateral pleural effusions postop period Excellent management of bilateral pleural effusions with bilateral Pleurx catheters. Will reduce Pleurx catheter drainage schedule to Monday-Thursday or twice weekly -this will beginnext week by home health nurse.  Plan:return for Pleurx catheter check and chest x-ray in 4 weeks.

## 2014-09-02 ENCOUNTER — Ambulatory Visit: Payer: Medicare Other | Admitting: Cardiothoracic Surgery

## 2014-09-23 ENCOUNTER — Encounter: Payer: Self-pay | Admitting: Nurse Practitioner

## 2014-09-23 ENCOUNTER — Ambulatory Visit (INDEPENDENT_AMBULATORY_CARE_PROVIDER_SITE_OTHER): Payer: Medicare Other | Admitting: Nurse Practitioner

## 2014-09-23 VITALS — BP 136/70 | HR 60 | Ht 65.0 in | Wt 190.0 lb

## 2014-09-23 DIAGNOSIS — R0600 Dyspnea, unspecified: Secondary | ICD-10-CM

## 2014-09-23 DIAGNOSIS — Z951 Presence of aortocoronary bypass graft: Secondary | ICD-10-CM

## 2014-09-23 LAB — BASIC METABOLIC PANEL
BUN: 24 mg/dL — ABNORMAL HIGH (ref 6–23)
CO2: 30 mEq/L (ref 19–32)
Calcium: 9.6 mg/dL (ref 8.4–10.5)
Chloride: 102 mEq/L (ref 96–112)
Creatinine, Ser: 1.4 mg/dL — ABNORMAL HIGH (ref 0.40–1.20)
GFR: 39.32 mL/min — ABNORMAL LOW (ref >60.00)
Glucose, Bld: 111 mg/dL — ABNORMAL HIGH (ref 70–99)
Potassium: 4.8 mEq/L (ref 3.5–5.1)
Sodium: 135 mEq/L (ref 135–145)

## 2014-09-23 LAB — CBC
HCT: 33.9 % — ABNORMAL LOW (ref 36.0–46.0)
Hemoglobin: 11.3 g/dL — ABNORMAL LOW (ref 12.0–15.0)
MCHC: 33.4 g/dL (ref 30.0–36.0)
MCV: 82.5 fl (ref 78.0–100.0)
Platelets: 168 10*3/uL (ref 150.0–400.0)
RBC: 4.1 Mil/uL (ref 3.87–5.11)
RDW: 14.9 % (ref 11.5–15.5)
WBC: 5.3 10*3/uL (ref 4.0–10.5)

## 2014-09-23 MED ORDER — FUROSEMIDE 20 MG PO TABS: 20.0000 mg | ORAL_TABLET | Freq: Every day | ORAL | Status: DC

## 2014-09-23 NOTE — Patient Instructions (Signed)
We will be checking the following labs today BMET and CBC  Stay on your current medicines  Try to restrict your salt  I am putting you on Lasix 20 mg to take each day - this should help with your swelling  Keep up with your walking - work towards 30 to 45 minutes per day  I will see you in 4 weeks with fasting labs  Call the [place] office at [phone] if you have any questions, problems or concerns.

## 2014-09-23 NOTE — Progress Notes (Signed)
CARDIOLOGY OFFICE NOTE  Date:  09/23/2014    [name] Date of Birth: [birth year] Medical Record [id number]  PCP:  Allie Dimmer, MD  Cardiologist:  Martinique    Chief Complaint  Patient presents with  . Coronary Artery Disease    Post CABG - seen for Dr. Martinique.     History of Present Illness: Joyce [name] is a 74 y.o. female who presents today for a presumed post hospital visit - s/p CABG back in December. She has a history of DM II, HTN, CKD III,  CAD s/p CABG with LIMA to LAD, SVG to DX, SVG to ramus intermediate, and SVG to PD 05/2014, recent pleural effusion thought to be post-thoracotomy pleural effusion s/p thoracentesis, subsequent Pleurex placed and followed by Dr. Darcey Nora.   Her post op course since her CABG has been complicated by recurrent pleural effusion - necessitating pleurex cath, lower extremity edema, and URI.   She saw Dr. Darcey Nora earlier this month and is to see him again next week.   Comes back today. Here with her daughter. She actually has 2 pleurex tubes in place. Her tubes are still draining about 200/300 respectively still. She feels like she has done well from our standpoint. No chest pain. Walking some. More swelling over the past few days - sounds like she has gotten too much salt. No longer on her Lasix. Not able to start rehab due to her Pleurex tubes. She has seen ENT this week - has a stone in a salivary gland. To see them again in 2 weeks. Sugar is up a little - she admits she is not watching her diet as well as she should. Not dizzy or lightheaded.   Past Medical History  Diagnosis Date  . Hypertension   . Anginal pain   . Acute on chronic diastolic heart failure 123456  . Pleural effusion 07/26/2014    Post-operative - bilateral   . Complication of anesthesia     "one of the RX's they use for anesthesia makes me talk crazy"   . High cholesterol   . Type II diabetes mellitus   . Pneumonia 1990's X 1  . Anemia   .  Fibromyalgia   . Arthritis     "right hip; hands" (08/04/2014)  . Chronic lower back pain   . CHF (congestive heart failure)   . Chronic kidney disease (CKD), stage III (moderate)   . Bilateral pleural effusion 08/04/2014    Past Surgical History  Procedure Laterality Date  . Left heart catheterization with coronary angiogram N/A 06/11/2014    Procedure: LEFT HEART CATHETERIZATION WITH CORONARY ANGIOGRAM;  Surgeon: Blane Ohara, MD;  Location: [facility] CATH LAB;  Service: Cardiovascular;  Laterality: N/A;  . Coronary artery bypass graft N/A 06/15/2014    Procedure: CORONARY ARTERY BYPASS GRAFTING (CABG);  Surgeon: Ivin Poot, MD;  Location: [place];  Service: Open Heart Surgery;  Laterality: N/A;  . Intraoperative transesophageal echocardiogram N/A 06/15/2014    Procedure: INTRAOPERATIVE TRANSESOPHAGEAL ECHOCARDIOGRAM;  Surgeon: Ivin Poot, MD;  Location: [place];  Service: Open Heart Surgery;  Laterality: N/A;  . Tonsillectomy  ~ 1950  . Cholecystectomy  ~ 2005  . Abdominal hysterectomy  1980's  . Cardiac catheterization    . Appendectomy  1970's  . Back surgery    . Lumbar disc surgery  1980's X 1; 1990's X  2000's X 1  . Anterior cervical decomp/discectomy fusion  2000's  . Chest tube insertion Left  08/07/2014    Procedure: INSERTION PLEURAL DRAINAGE CATHETER;  Surgeon: Ivin Poot, MD;  Location: [facility];  Service: Thoracic;  Laterality: Left;  . Chest tube insertion Right 08/12/2014    Procedure: INSERTION PLEURAL DRAINAGE CATHETER;  Surgeon: Ivin Poot, MD;  Location: [facility] OR;  Service: Thoracic;  Laterality: Right;     Medications: Current Outpatient Prescriptions  Medication Sig Dispense Refill  . amLODipine (NORVASC) 10 MG tablet Take 10 mg by mouth daily.   5  . aspirin EC 325 MG EC tablet Take 1 tablet (325 mg total) by mouth daily. 30 tablet 0  . atorvastatin (LIPITOR) 40 MG tablet Take 1 tablet (40 mg total) by mouth daily at 6 PM.    . ferrous  [id number] C-folic acid (TRINSICON / FOLTRIN) capsule Take 1 capsule by mouth daily with breakfast. For one month then stop.    Marland Kitchen GLUCERNA (GLUCERNA) LIQD Take 237 mLs by mouth daily.    . hydrALAZINE (APRESOLINE) 25 MG tablet Take 3 tablets (75 mg total) by mouth every 8 (eight) hours. (Patient taking differently: Take 75 mg by mouth daily. )    . insulin glargine (LANTUS) 100 UNIT/ML injection Inject 10 Units into the skin at bedtime.    . insulin lispro (HUMALOG) 100 UNIT/ML KiwkPen Inject 0-12 Units into the skin 3 (three) times daily with meals.     . polyethylene glycol (MIRALAX / GLYCOLAX) packet Take 17 g by mouth daily as needed for mild constipation. 14 each 2  . primidone (MYSOLINE) 50 MG tablet Take 50 mg by mouth daily.   5  . traMADol (ULTRAM) 50 MG tablet Take 1 tablet (50 mg total) by mouth every 6 (six) hours as needed for moderate pain. (Patient taking differently: Take 50 mg by mouth every 4 (four) hours as needed for moderate pain or severe pain. ) 30 tablet 0  . traZODone (DESYREL) 100 MG tablet Take 100 mg by mouth at bedtime.   0  . furosemide (LASIX) 20 MG tablet Take 1 tablet (20 mg total) by mouth daily. 90 tablet 3   No current facility-administered medications for this visit.    Allergies: Allergies  Allergen Reactions  . Carvedilol Other (See Comments)    confusion  . Codeine Other (See Comments)    confusion  . Metoprolol Palpitations    Social History: The patient  reports that she has never smoked. She has never used smokeless tobacco. She reports that she does not drink alcohol or use illicit drugs.   Family History: The patient's family history includes Alcoholism in her brother and brother; Diabetes in her mother; Heart disease in her sister; Hypertension in her mother; Stroke in her father.   Review of Systems: Please see the history of present illness.   Otherwise, the review of systems is positive for dyspnea.  All other systems are  reviewed and negative.   Physical Exam: VS:  BP 136/70 mmHg  Pulse 60  Ht 5\' 5"  (1.651 m)  Wt 190 lb (86.183 kg)  BMI 31.62 kg/m2 .  BMI Body mass index is 31.62 kg/(m^2).   Her weight is up 9 pounds.   Wt Readings from Last 3 Encounters:  09/23/14 190 lb (86.183 kg)  08/26/14 181 lb 9.6 oz (82.373 kg)  08/13/14 183 lb 3.2 oz (83.1 kg)    General: Pleasant. Well developed, well nourished and in no acute distress.  HEENT: Normal. Neck: Supple, no JVD, carotid bruits, or masses noted.  Cardiac: Regular  rate and rhythm. No murmurs, rubs, or gallops. 1+ edema. Sternum looks ok. Respiratory:  Lungs are fairly clear to auscultation bilaterally with normal work of breathing. She has bilateral pleurex catheters in place.  GI: Soft and nontender.  MS: No deformity or atrophy. Gait and ROM intact. Skin: Warm and dry. Color is a little pale.  Neuro:  Strength and sensation are intact and no gross focal deficits noted.  Psych: Alert, appropriate and with normal affect.   LABORATORY DATA:  EKG:  EKG is not ordered today.   Lab Results  Component Value Date   WBC 3.4* 08/08/2014   HGB 9.1* 08/08/2014   HCT 28.0* 08/08/2014   PLT 144* 08/08/2014   GLUCOSE 146* 08/12/2014   CHOL 177 06/11/2014   TRIG 157* 06/11/2014   HDL 48 06/11/2014   LDLCALC 98 06/11/2014   ALT 15 08/04/2014   AST 20 08/04/2014   NA 136 08/12/2014   K 4.1 08/12/2014   CL 103 08/12/2014   CREATININE 1.42* 08/12/2014   BUN 19 08/12/2014   CO2 28 08/12/2014   TSH 3.700 06/13/2014   INR 1.05 08/04/2014   HGBA1C 5.9* 06/13/2014    BNP (last 3 results)  Recent Labs  07/26/14 1015 08/04/14 0112  BNP 97.0 159.8*    ProBNP (last 3 results) No results for input(s): PROBNP in the last 8760 hours.   Other Studies Reviewed Today:  Echo Study Conclusions from 07/2014  - Left ventricle: The cavity size was normal. Systolic function was normal. The estimated ejection fraction was in the range of  55% to 60%. Although no diagnostic regional wall motion abnormality was identified, this possibility cannot be completely excluded on the basis of this study. Doppler parameters are consistent with abnormal left ventricular relaxation (grade 1 diastolic dysfunction). - Ventricular septum: Septal motion showed paradox. - Atrial septum: No defect or patent foramen ovale was identified. - Pericardium, extracardiac: There was a left pleural effusion.  Assessment/Plan: 1. CAD - post CABG - 3 months out - doing well. Encouraged her to increase her walking. Recheck surveillance labs today. CV risk factor modification encouraged.   2. Recurrent pleural effusions - she has bilateral pleurex tubes in place - still draining significant amount - followed by Dr. Prescott Gum.  3. HTN - BP ok - she is off her HCTZ. She is on high dose Norvasc but this is not new.  4. DM  5. HLD - check labs on return.   Current medicines are reviewed with the patient today.  The patient does not have concerns regarding medicines other than what has been noted above.  The following changes have been made:  See above.  Labs/ tests ordered today include:    Orders Placed This Encounter  Procedures  . CBC  . Basic metabolic panel     Disposition:   FU with me in 4 weeks with fasting labs  Patient is agreeable to this plan and will call if any problems develop in the interim.   Signed: Burtis Junes, RN, ANP-C 09/23/2014 11:32 AM  [facility] [address] [place], [place]  [address] Phone: [phone] Fax: [phone]

## 2014-09-29 ENCOUNTER — Other Ambulatory Visit: Payer: Self-pay | Admitting: Cardiothoracic Surgery

## 2014-09-29 DIAGNOSIS — J90 Pleural effusion, not elsewhere classified: Secondary | ICD-10-CM

## 2014-09-30 ENCOUNTER — Ambulatory Visit
Admission: RE | Admit: 2014-09-30 | Discharge: 2014-09-30 | Disposition: A | Discharge status: Home / Self Care | Payer: Medicare Other | Source: Ambulatory Visit | Attending: Cardiothoracic Surgery | Admitting: Cardiothoracic Surgery

## 2014-09-30 ENCOUNTER — Encounter: Payer: Self-pay | Admitting: Cardiothoracic Surgery

## 2014-09-30 ENCOUNTER — Ambulatory Visit (INDEPENDENT_AMBULATORY_CARE_PROVIDER_SITE_OTHER): Payer: Medicare Other | Admitting: Cardiothoracic Surgery

## 2014-09-30 VITALS — BP 143/76 | HR 77 | Resp 16 | Ht 65.0 in | Wt 190.0 lb

## 2014-09-30 DIAGNOSIS — J948 Other specified pleural conditions: Secondary | ICD-10-CM

## 2014-09-30 DIAGNOSIS — J90 Pleural effusion, not elsewhere classified: Secondary | ICD-10-CM

## 2014-09-30 DIAGNOSIS — Z951 Presence of aortocoronary bypass graft: Secondary | ICD-10-CM

## 2014-09-30 NOTE — Progress Notes (Signed)
PCP is Allie Dimmer, MD Referring Provider is Bartholomew Crews, MD  Chief Complaint  Patient presents with  . Follow-up    S/P BILATERAL PLEURX...draining twice a week left avering 200cc and right 300cc....last drained 09/28/14    [id number] returns for routine Pleurx catheter visit. She has bilateral Pleurx catheters for bilateral serous pleural effusions after multivessel CABG last December. She is currently now on Lasix 20 mg daily as started by her cardiologist. Her catheter drainage schedule his twice weekly. The volumes are approximately 1-75-200 cc. We will reduce her catheter drainage schedule to once weekly and if this results in less than 100 cc 4 repeated drainage episodes we will consider removal catheters. Now that she is taking Lasix daily hopefully this will accelerate the process.  The catheter sites are clean and dry. Her with a silk sutures from each catheter. We drainage catheters in the office today applied sterile dressings Past Medical History  Diagnosis Date  . Hypertension   . Anginal pain   . Acute on chronic diastolic heart failure 123456  . Pleural effusion 07/26/2014    Post-operative - bilateral   . Complication of anesthesia     "one of the RX's they use for anesthesia makes me talk crazy"   . High cholesterol   . Type II diabetes mellitus   . Pneumonia 1990's X 1  . Anemia   . Fibromyalgia   . Arthritis     "right hip; hands" (08/04/2014)  . Chronic lower back pain   . CHF (congestive heart failure)   . Chronic kidney disease (CKD), stage III (moderate)   . Bilateral pleural effusion 08/04/2014    Past Surgical History  Procedure Laterality Date  . Left heart catheterization with coronary angiogram N/A 06/11/2014    Procedure: LEFT HEART CATHETERIZATION WITH CORONARY ANGIOGRAM;  Surgeon: Blane Ohara, MD;  Location: [facility] CATH LAB;  Service: Cardiovascular;  Laterality: N/A;  . Coronary artery bypass graft N/A 06/15/2014    Procedure: CORONARY  ARTERY BYPASS GRAFTING (CABG);  Surgeon: Ivin Poot, MD;  Location: [facility];  Service: Open Heart Surgery;  Laterality: N/A;  . Intraoperative transesophageal echocardiogram N/A 06/15/2014    Procedure: INTRAOPERATIVE TRANSESOPHAGEAL ECHOCARDIOGRAM;  Surgeon: Ivin Poot, MD;  Location: [facility];  Service: Open Heart Surgery;  Laterality: N/A;  . Tonsillectomy  ~ 1950  . Cholecystectomy  ~ 2005  . Abdominal hysterectomy  1980's  . Cardiac catheterization    . Appendectomy  1970's  . Back surgery    . Lumbar disc surgery  1980's X 1; 1990's X  2000's X 1  . Anterior cervical decomp/discectomy fusion  2000's  . Chest tube insertion Left 08/07/2014    Procedure: INSERTION PLEURAL DRAINAGE CATHETER;  Surgeon: Ivin Poot, MD;  Location: [facility];  Service: Thoracic;  Laterality: Left;  . Chest tube insertion Right 08/12/2014    Procedure: INSERTION PLEURAL DRAINAGE CATHETER;  Surgeon: Ivin Poot, MD;  Location[facility] OR;  Service: Thoracic;  Laterality: Right;    Family History  Problem Relation Age of Onset  . Stroke Father   . Diabetes Mother   . Hypertension Mother   . Alcoholism Brother   . Alcoholism Brother   . Heart disease Sister     Social History History  Substance Use Topics  . Smoking status: Never Smoker   . Smokeless tobacco: Never Used  . Alcohol Use: No    Current Outpatient Prescriptions  Medication Sig Dispense Refill  .  amLODipine (NORVASC) 10 MG tablet Take 10 mg by mouth daily.   5  . aspirin EC 325 MG EC tablet Take 1 tablet (325 mg total) by mouth daily. 30 tablet 0  . atorvastatin (LIPITOR) 40 MG tablet Take 1 tablet (40 mg total) by mouth daily at 6 PM.    . ferrous Q000111Q C-folic acid (TRINSICON / FOLTRIN) capsule Take 1 capsule by mouth daily with breakfast. For one month then stop.    . furosemide (LASIX) 20 MG tablet Take 1 tablet (20 mg total) by mouth daily. 90 tablet 3  . GLUCERNA (GLUCERNA) LIQD Take 237 mLs by mouth daily.     . hydrALAZINE (APRESOLINE) 25 MG tablet Take 3 tablets (75 mg total) by mouth every 8 (eight) hours. (Patient taking differently: Take 75 mg by mouth daily. )    . HYDROcodone-acetaminophen (NORCO/VICODIN) 5-325 MG per tablet Take 1 tablet by mouth every 6 (six) hours as needed for moderate pain.    Marland Kitchen insulin glargine (LANTUS) 100 UNIT/ML injection Inject 10 Units into the skin at bedtime.    . insulin lispro (HUMALOG) 100 UNIT/ML KiwkPen Inject 0-12 Units into the skin 3 (three) times daily with meals.     . polyethylene glycol (MIRALAX / GLYCOLAX) packet Take 17 g by mouth daily as needed for mild constipation. 14 each 2  . primidone (MYSOLINE) 50 MG tablet Take 50 mg by mouth daily.   5  . traMADol (ULTRAM) 50 MG tablet Take 1 tablet (50 mg total) by mouth every 6 (six) hours as needed for moderate pain. (Patient taking differently: Take 50 mg by mouth every 4 (four) hours as needed for moderate pain or severe pain. ) 30 tablet 0  . traZODone (DESYREL) 100 MG tablet Take 100 mg by mouth at bedtime.   0   No current facility-administered medications for this visit.    Allergies  Allergen Reactions  . Carvedilol Other (See Comments)    confusion  . Codeine Other (See Comments)    confusion  . Metoprolol Palpitations    Review of Systems   Minimal difficulty breathing Trying  to watch her salt intake No symptoms of angina  BP 143/76 mmHg  Pulse 77  Resp 16  Ht 5\' 5"  (1.651 m)  Wt 190 lb (86.183 kg)  BMI 31.62 kg/m2  SpO2 96% Physical Exam Alert and comfortable Lungs clear bilaterally Pleurx catheter incisions clean Minimal-trace pedal edema Heart rate regular  Diagnostic Tests: Chest x-ray clear, minimal pleural effusions, Pleurx catheters in good position  Impression: Significant improvement in the amount of pleural fluid removed by the catheters. We'll reduce her schedule tube drainage once weekly.  Plan:she returned in 4 days 6 weeks for review. If her catheter  drainage results and less than 100 cc per week for several weeks will consider removal catheters. She understands she should continue Lasix 20 mg daily long-term   Len Childs, MD [facility] [phone]

## 2014-10-22 ENCOUNTER — Other Ambulatory Visit: Payer: Self-pay | Admitting: Otolaryngology

## 2014-10-22 DIAGNOSIS — K1120 Sialoadenitis, unspecified: Secondary | ICD-10-CM

## 2014-10-23 ENCOUNTER — Ambulatory Visit (INDEPENDENT_AMBULATORY_CARE_PROVIDER_SITE_OTHER): Payer: Medicare Other | Admitting: Nurse Practitioner

## 2014-10-23 ENCOUNTER — Encounter: Payer: Self-pay | Admitting: Nurse Practitioner

## 2014-10-23 VITALS — BP 140/60 | HR 79 | Resp 18 | Ht 65.0 in | Wt 183.0 lb

## 2014-10-23 DIAGNOSIS — I10 Essential (primary) hypertension: Secondary | ICD-10-CM

## 2014-10-23 DIAGNOSIS — E785 Hyperlipidemia, unspecified: Secondary | ICD-10-CM

## 2014-10-23 DIAGNOSIS — Z951 Presence of aortocoronary bypass graft: Secondary | ICD-10-CM

## 2014-10-23 LAB — BASIC METABOLIC PANEL WITH GFR
BUN: 29 mg/dL — ABNORMAL HIGH (ref 6–23)
CO2: 29 meq/L (ref 19–32)
Calcium: 9.7 mg/dL (ref 8.4–10.5)
Chloride: 103 meq/L (ref 96–112)
Creatinine, Ser: 1.48 mg/dL — ABNORMAL HIGH (ref 0.40–1.20)
GFR: 36.87 mL/min — ABNORMAL LOW
Glucose, Bld: 143 mg/dL — ABNORMAL HIGH (ref 70–99)
Potassium: 4.4 meq/L (ref 3.5–5.1)
Sodium: 138 meq/L (ref 135–145)

## 2014-10-23 LAB — HEPATIC FUNCTION PANEL
ALT: 16 U/L (ref 0–35)
AST: 19 U/L (ref 0–37)
Albumin: 4 g/dL (ref 3.5–5.2)
Alkaline Phosphatase: 49 U/L (ref 39–117)
Bilirubin, Direct: 0.1 mg/dL (ref 0.0–0.3)
Total Bilirubin: 0.6 mg/dL (ref 0.2–1.2)
Total Protein: 6.7 g/dL (ref 6.0–8.3)

## 2014-10-23 LAB — LIPID PANEL
Cholesterol: 105 mg/dL (ref 0–200)
HDL: 47.2 mg/dL
LDL Cholesterol: 34 mg/dL (ref 0–99)
NonHDL: 57.8
Total CHOL/HDL Ratio: 2
Triglycerides: 121 mg/dL (ref 0.0–149.0)
VLDL: 24.2 mg/dL (ref 0.0–40.0)

## 2014-10-23 NOTE — Patient Instructions (Addendum)
We will be checking the following labs today - BMET, Lipids and HPF   Medication Instructions:    Continue with your current medicines.     Testing/Procedures To Be Arranged:  N/A  Follow-Up:   See Dr. Martinique in 3 months at [facility]    Other Special Instructions:   N/A  Call the [place] office at [phone] if you have any questions, problems or concerns.

## 2014-10-23 NOTE — Progress Notes (Addendum)
CARDIOLOGY OFFICE NOTE  Date:  10/23/2014    Joyce [name] Date of Birth: 19-Aug-1940 Medical Record [id number]  PCP:  Allie Dimmer, MD  Cardiologist:  Martinique    Chief Complaint  Patient presents with  . Coronary Artery Disease    Follow up visit - seen for Dr. Martinique     History of Present Illness: Joyce [name] is a 74 y.o. female who presents today for a one month check. She is seen for Dr. Martinique. She is s/p CABG back in December of 2015. 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 Darcey Nora.   Her post op course since her CABG has been complicated by recurrent pleural effusion - necessitating pleurex cath, lower extremity edema, and URI.   I saw her in the [facility]LEX last month - cardiac status was stable. She actually had 2 pleurex tubes in place. Her tubes were still draining about 200/300 respectively at that time.  She had seen ENT for a stone in a salivary gland and was to have follow up 2 weeks later.  Comes back today. Here alone today. Saw Dr. Prescott Gum earlier this month - drainage from her catheters was improving and he reduced her schedule tube drainage. She is getting out more. Has started rehab. Feels good. BP is good at home. Will be having a CT to further define this possible salivary gland. Needs BMET today as well. Tolerating her medicines.    Past Medical History  Diagnosis Date  . Hypertension   . Anginal pain   . Acute on chronic diastolic heart failure [id number]  . Pleural effusion 07/26/2014    Post-operative - bilateral   . Complication of anesthesia     "one of the RX's they use for anesthesia makes me talk crazy"   . High cholesterol   . Type II diabetes mellitus   . Pneumonia 1990's X 1  . Anemia   .  Fibromyalgia   . Arthritis     "right hip; hands" (08/04/2014)  . Chronic lower back pain   . CHF (congestive heart failure)   . Chronic kidney disease (CKD), stage III (moderate)   . Bilateral pleural effusion 08/04/2014    Past Surgical History  Procedure Laterality Date  . Left heart catheterization with coronary angiogram N/A 06/11/2014    Procedure: LEFT HEART CATHETERIZATION WITH CORONARY ANGIOGRAM;  Surgeon: Blane Ohara, MD;  Location: [facility] CATH LAB;  Service: Cardiovascular;  Laterality: N/A;  . Coronary artery bypass graft N/A 06/15/2014    Procedure: CORONARY ARTERY BYPASS GRAFTING (CABG);  Surgeon: Ivin Poot, MD;  Location: [place];  Service: Open Heart Surgery;  Laterality: N/A;  . Intraoperative transesophageal echocardiogram N/A 06/15/2014    Procedure: INTRAOPERATIVE TRANSESOPHAGEAL ECHOCARDIOGRAM;  Surgeon: Ivin Poot, MD;  Location: [place];  Service: Open Heart Surgery;  Laterality: N/A;  . Tonsillectomy  ~ 1950  . Cholecystectomy  ~ 2005  . Abdominal hysterectomy  1980's  . Cardiac catheterization    . Appendectomy  1970's  . Back surgery    . Lumbar disc surgery  1980's X 1; 1990's X  2000's X 1  . Anterior cervical decomp/discectomy fusion  2000's  . Chest tube insertion Left 08/07/2014    Procedure:  INSERTION PLEURAL DRAINAGE CATHETER;  Surgeon: Ivin Poot, MD;  Location: [place];  Service: Thoracic;  Laterality: Left;  . Chest tube insertion Right 08/12/2014    Procedure: INSERTION PLEURAL DRAINAGE CATHETER;  Surgeon: Ivin Poot, MD;  Location: [facility] OR;  Service: Thoracic;  Laterality: Right;     Medications: Current Outpatient Prescriptions  Medication Sig Dispense Refill  . amLODipine (NORVASC) 10 MG tablet Take 10 mg by mouth daily.   5  . aspirin EC 325 MG EC tablet Take 1 tablet (325 mg total) by mouth daily. 30 tablet 0  . atorvastatin (LIPITOR) 40 MG tablet Take 1 tablet (40 mg total) by mouth daily at 6 PM.    . B Complex-C-Folic Acid  (DIALYVITE PO) Take 1 tablet by mouth.    . Coconut Oil OIL by Does not apply route.    . ferrous Q000111Q C-folic acid (TRINSICON / FOLTRIN) capsule Take 1 capsule by mouth daily with breakfast. For one month then stop.    . furosemide (LASIX) 20 MG tablet Take 1 tablet (20 mg total) by mouth daily. 90 tablet 3  . GLUCERNA (GLUCERNA) LIQD Take 237 mLs by mouth daily.    . hydrALAZINE (APRESOLINE) 25 MG tablet Take 3 tablets (75 mg total) by mouth every 8 (eight) hours. (Patient taking differently: Take 75 mg by mouth 2 (two) times daily. )    . HYDROcodone-acetaminophen (NORCO/VICODIN) 5-325 MG per tablet Take 1 tablet by mouth every 6 (six) hours as needed for moderate pain.    Marland Kitchen insulin glargine (LANTUS) 100 UNIT/ML injection Inject 10 Units into the skin at bedtime.    . insulin lispro (HUMALOG) 100 UNIT/ML KiwkPen Inject 0-12 Units into the skin 3 (three) times daily with meals.     . polyethylene glycol (MIRALAX / GLYCOLAX) packet Take 17 g by mouth daily as needed for mild constipation. 14 each 2  . primidone (MYSOLINE) 50 MG tablet Take 50 mg by mouth daily.   5  . sodium chloride (OCEAN) 0.65 % nasal spray Place into the nose.    . traMADol (ULTRAM) 50 MG tablet Take 1 tablet (50 mg total) by mouth every 6 (six) hours as needed for moderate pain. (Patient taking differently: Take 50 mg by mouth every 4 (four) hours as needed for moderate pain or severe pain. ) 30 tablet 0  . traZODone (DESYREL) 100 MG tablet Take 100 mg by mouth at bedtime.   0   No current facility-administered medications for this visit.    Allergies: Allergies  Allergen Reactions  . Carvedilol Other (See Comments)    confusion  . Codeine Other (See Comments)    confusion  . Metoprolol Palpitations    Social History: The patient  reports that she has never smoked. She has never used smokeless tobacco. She reports that she does not drink alcohol or use illicit drugs.   Family History: The  patient's family history includes Alcoholism in her brother and brother; Diabetes in her mother; Heart disease in her sister; Hypertension in her mother; Stroke in her father.   Review of Systems: Please see the history of present illness.   Otherwise, the review of systems is positive for .   All other systems are reviewed and negative.   Physical Exam: VS:  BP 140/60 mmHg  Pulse 79  Resp 18  Ht 5\' 5"  (1.651 m)  Wt 183 lb (83.008 kg)  BMI 30.45 kg/m2  SpO2 98% .  BMI Body mass index  is 30.45 kg/(m^2).  Wt Readings from Last 3 Encounters:  10/23/14 183 lb (83.008 kg)  09/30/14 190 lb (86.183 kg)  09/23/14 190 lb (86.183 kg)    General: Pleasant. Well developed, well nourished and in no acute distress.  HEENT: Normal. Neck: Supple, no JVD, carotid bruits, or masses noted.  Cardiac: Regular rate and rhythm. No murmurs, rubs, or gallops. No edema.  Respiratory:  Lungs are clear to auscultation - decreased in both bases but moving air. She has normal work of breathing. Pleurex tubes in place.  GI: Soft and nontender.  MS: No deformity or atrophy. Gait and ROM intact. Skin: Warm and dry. Color is normal.  Neuro:  Strength and sensation are intact and no gross focal deficits noted.  Psych: Alert, appropriate and with normal affect.   LABORATORY DATA:  EKG:  EKG is not ordered today.   Lab Results  Component Value Date   WBC 5.3 09/23/2014   HGB 11.3* 09/23/2014   HCT 33.9* 09/23/2014   PLT 168.0 09/23/2014   GLUCOSE 111* 09/23/2014   CHOL 177 06/11/2014   TRIG 157* 06/11/2014   HDL 48 06/11/2014   LDLCALC 98 06/11/2014   ALT 15 08/04/2014   AST 20 08/04/2014   NA 135 09/23/2014   K 4.8 09/23/2014   CL 102 09/23/2014   CREATININE 1.40* 09/23/2014   BUN 24* 09/23/2014   CO2 30 09/23/2014   TSH 3.700 06/13/2014   INR 1.05 08/04/2014   HGBA1C 5.9* 06/13/2014    BNP (last 3 results)  Recent Labs  07/26/14 1015 08/04/14 0112  BNP 97.0 159.8*    ProBNP (last 3  results) No results for input(s): PROBNP in the last 8760 hours.   Other Studies Reviewed Today:  Echo Study Conclusions from 07/2014  - Left ventricle: The cavity size was normal. Systolic function was normal. The estimated ejection fraction was in the range of 55% to 60%. Although no diagnostic regional wall motion abnormality was identified, this possibility cannot be completely excluded on the basis of this study. Doppler parameters are consistent with abnormal left ventricular relaxation (grade 1 diastolic dysfunction). - Ventricular septum: Septal motion showed paradox. - Atrial septum: No defect or patent foramen ovale was identified. - Pericardium, extracardiac: There was a left pleural effusion.  Assessment/Plan: 1. CAD - post CABG - 4 months out - doing well. CV risk factor modification encouraged.   2. Recurrent pleural effusions - she has bilateral pleurex tubes in place  - followed by Dr. Prescott Gum.  3. HTN - BP stable on current regimen.   4. DM  5. HLD - checking labs today.   6. Possible salivary gland stone - for CT next week. Followed by ENT.  Current medicines are reviewed with the patient today.  The patient does not have concerns regarding medicines other than what has been noted above.  The following changes have been made:  See above.  Labs/ tests ordered today include:    Orders Placed This Encounter  Procedures  . Basic metabolic panel  . Hepatic function panel  . Lipid panel     Disposition:   FU with Dr. Martinique in 3 months.   Patient is agreeable to this plan and will call if any problems develop in the interim.   Signed: Burtis Junes, RN, ANP-C 10/23/2014 11:29 AM  [place] [address] [place], [place]  [address] Phone: [phone] Fax: [phone]  Addendum:  Labs noted - worsening kidney function  - will cut Lasix back to QOD - reviewed with Dr. Prescott Gum as well who is in agreement.

## 2014-10-28 ENCOUNTER — Other Ambulatory Visit: Payer: Self-pay | Admitting: *Deleted

## 2014-10-28 ENCOUNTER — Telehealth: Payer: Self-pay | Admitting: Nurse Practitioner

## 2014-10-28 DIAGNOSIS — N183 Chronic kidney disease, stage 3 unspecified: Secondary | ICD-10-CM

## 2014-10-28 NOTE — Telephone Encounter (Signed)
New problem   Pt was calling concerning having her kidney function redone.  There were no orders in system.Please call pt.

## 2014-10-29 ENCOUNTER — Inpatient Hospital Stay: Admission: RE | Admit: 2014-10-29 | Payer: Medicare Other | Source: Ambulatory Visit

## 2014-10-30 ENCOUNTER — Ambulatory Visit: Payer: Medicare Other | Admitting: Cardiology

## 2014-11-03 ENCOUNTER — Other Ambulatory Visit: Payer: Self-pay | Admitting: Cardiothoracic Surgery

## 2014-11-03 DIAGNOSIS — Z951 Presence of aortocoronary bypass graft: Secondary | ICD-10-CM

## 2014-11-04 ENCOUNTER — Encounter: Payer: Self-pay | Admitting: Cardiothoracic Surgery

## 2014-11-04 ENCOUNTER — Ambulatory Visit (INDEPENDENT_AMBULATORY_CARE_PROVIDER_SITE_OTHER): Payer: Medicare Other | Admitting: Cardiothoracic Surgery

## 2014-11-04 ENCOUNTER — Ambulatory Visit
Admission: RE | Admit: 2014-11-04 | Discharge: 2014-11-04 | Disposition: A | Discharge status: Home / Self Care | Payer: Medicare Other | Source: Ambulatory Visit | Attending: Cardiothoracic Surgery | Admitting: Cardiothoracic Surgery

## 2014-11-04 ENCOUNTER — Other Ambulatory Visit: Payer: Medicare Other

## 2014-11-04 VITALS — BP 150/77 | HR 66 | Resp 20 | Ht 65.0 in | Wt 187.0 lb

## 2014-11-04 DIAGNOSIS — Z951 Presence of aortocoronary bypass graft: Secondary | ICD-10-CM

## 2014-11-04 DIAGNOSIS — J90 Pleural effusion, not elsewhere classified: Secondary | ICD-10-CM

## 2014-11-04 NOTE — Progress Notes (Signed)
PCP is Allie Dimmer, MD Referring Provider is Bartholomew Crews, MD  Chief Complaint  Patient presents with  . Routine Post Op    6 week f/u with CXR, drains Pleurx's every Tuesday, yesterday- amt's L -200cc and R- 150cc's, taking lasix 20 mg QOD now due to kidney function     HPI: The patient returns for routine Pleurx catheter check. The patient has bilateral Pleurx catheters for bilateral pleural effusions-benign. They are  probably related to diastolic heart failure and low protein.she is draining the catheters just once a week in both sides are returning approximately 200 ML's. This is just enough to keep the catheters in place. She is also taking Lasix every other day as directed by her physicians. The catheter exit sites are clean and the fluid itself was clear.  Chest x-ray taken today shows Pleurx catheters to be in good position with no significant pleural effusion on either side.  Past Medical History  Diagnosis Date  . Hypertension   . Anginal pain   . Acute on chronic diastolic heart failure 123456  . Pleural effusion 07/26/2014    Post-operative - bilateral   . Complication of anesthesia     "one of the RX's they use for anesthesia makes me talk crazy"   . High cholesterol   . Type II diabetes mellitus   . Pneumonia 1990's X 1  . Anemia   . Fibromyalgia   . Arthritis     "right hip; hands" (08/04/2014)  . Chronic lower back pain   . CHF (congestive heart failure)   . Chronic kidney disease (CKD), stage III (moderate)   . Bilateral pleural effusion 08/04/2014    Past Surgical History  Procedure Laterality Date  . Left heart catheterization with coronary angiogram N/A 06/11/2014    Procedure: LEFT HEART CATHETERIZATION WITH CORONARY ANGIOGRAM;  Surgeon: Blane Ohara, MD;  Location: [facility] CATH LAB;  Service: Cardiovascular;  Laterality: N/A;  . Coronary artery bypass graft N/A 06/15/2014    Procedure: CORONARY ARTERY BYPASS GRAFTING (CABG);  Surgeon: Ivin Poot, MD;  Location: [place];  Service: Open Heart Surgery;  Laterality: N/A;  . Intraoperative transesophageal echocardiogram N/A 06/15/2014    Procedure: INTRAOPERATIVE TRANSESOPHAGEAL ECHOCARDIOGRAM;  Surgeon: Ivin Poot, MD;  Location: [place];  Service: Open Heart Surgery;  Laterality: N/A;  . Tonsillectomy  ~ 1950  . Cholecystectomy  ~ 2005  . Abdominal hysterectomy  1980's  . Cardiac catheterization    . Appendectomy  1970's  . Back surgery    . Lumbar disc surgery  1980's X 1; 1990's X  2000's X 1  . Anterior cervical decomp/discectomy fusion  2000's  . Chest tube insertion Left 08/07/2014    Procedure: INSERTION PLEURAL DRAINAGE CATHETER;  Surgeon: Ivin Poot, MD;  Location: [place];  Service: Thoracic;  Laterality: Left;  . Chest tube insertion Right 08/12/2014    Procedure: INSERTION PLEURAL DRAINAGE CATHETER;  Surgeon: Ivin Poot, MD;  Location: [facility] OR;  Service: Thoracic;  Laterality: Right;    Family History  Problem Relation Age of Onset  . Stroke Father   . Diabetes Mother   . Hypertension Mother   . Alcoholism Brother   . Alcoholism Brother   . Heart disease Sister     Social History History  Substance Use Topics  . Smoking status: Never Smoker   . Smokeless tobacco: Never Used  . Alcohol Use: No    Current Outpatient Prescriptions  Medication Sig Dispense  Refill  . amLODipine (NORVASC) 10 MG tablet Take 10 mg by mouth daily.   5  . aspirin EC 325 MG EC tablet Take 1 tablet (325 mg total) by mouth daily. 30 tablet 0  . atorvastatin (LIPITOR) 40 MG tablet Take 1 tablet (40 mg total) by mouth daily at 6 PM.    . B Complex-C-Folic Acid (DIALYVITE PO) Take 1 tablet by mouth.    . Coconut Oil OIL by Does not apply route.    . ferrous Q000111Q C-folic acid (TRINSICON / FOLTRIN) capsule Take 1 capsule by mouth daily with breakfast. For one month then stop.    . furosemide (LASIX) 20 MG tablet Take 1 tablet (20 mg total) by mouth daily. (Patient  taking differently: Take 20 mg by mouth every other day. ) 90 tablet 3  . GLUCERNA (GLUCERNA) LIQD Take 237 mLs by mouth daily.    . hydrALAZINE (APRESOLINE) 25 MG tablet Take 3 tablets (75 mg total) by mouth every 8 (eight) hours. (Patient taking differently: Take 75 mg by mouth 2 (two) times daily. )    . HYDROcodone-acetaminophen (NORCO/VICODIN) 5-325 MG per tablet Take 1 tablet by mouth every 6 (six) hours as needed for moderate pain.    Marland Kitchen insulin glargine (LANTUS) 100 UNIT/ML injection Inject 10 Units into the skin at bedtime.    . insulin lispro (HUMALOG) 100 UNIT/ML KiwkPen Inject 0-12 Units into the skin 3 (three) times daily with meals.     . polyethylene glycol (MIRALAX / GLYCOLAX) packet Take 17 g by mouth daily as needed for mild constipation. 14 each 2  . primidone (MYSOLINE) 50 MG tablet Take 50 mg by mouth daily.   5  . sodium chloride (OCEAN) 0.65 % nasal spray Place into the nose.    . traMADol (ULTRAM) 50 MG tablet Take 1 tablet (50 mg total) by mouth every 6 (six) hours as needed for moderate pain. (Patient taking differently: Take 50 mg by mouth every 4 (four) hours as needed for moderate pain or severe pain. ) 30 tablet 0  . traZODone (DESYREL) 100 MG tablet Take 100 mg by mouth at bedtime.   0   No current facility-administered medications for this visit.    Allergies  Allergen Reactions  . Carvedilol Other (See Comments)    confusion  . Codeine Other (See Comments)    confusion  . Metoprolol Palpitations    Review of Systems  No shortness of breath Improved strength No chest pain No fever  BP 150/77 mmHg  Pulse 66  Resp 20  Ht 5\' 5"  (1.651 m)  Wt 187 lb (84.823 kg)  BMI 31.12 kg/m2  SpO2 97% Physical Exam Alert and comfortable Breath sounds clear and equal bilaterally Heart rhythm regular Sternal incision well-healed Both Pleurx catheter dressing sites clean and dry  Diagnostic Tests: Chest x-ray personally reviewed showing no significant  reaccumulation of pleural effusion bilaterally  Impression: Good control of pleural effusion with Pleurx catheter strategy. Continue bilateral catheters. Return for review in 4-6 weeks with chest x-ray  Plan:return with chest x-ray in 4-6 weeks. Continue Pleurx catheter drainage weekly on each side   Len Childs, MD [facility] [phone]

## 2014-11-17 ENCOUNTER — Encounter: Payer: Self-pay | Admitting: Nurse Practitioner

## 2014-11-17 ENCOUNTER — Other Ambulatory Visit: Payer: Self-pay | Admitting: *Deleted

## 2014-11-17 ENCOUNTER — Ambulatory Visit
Admission: RE | Admit: 2014-11-17 | Discharge: 2014-11-17 | Disposition: A | Discharge status: Home / Self Care | Payer: Medicare Other | Source: Ambulatory Visit | Attending: Otolaryngology | Admitting: Otolaryngology

## 2014-11-17 DIAGNOSIS — N183 Chronic kidney disease, stage 3 unspecified: Secondary | ICD-10-CM

## 2014-11-17 DIAGNOSIS — K1120 Sialoadenitis, unspecified: Secondary | ICD-10-CM

## 2014-12-21 ENCOUNTER — Other Ambulatory Visit: Payer: Self-pay | Admitting: Cardiothoracic Surgery

## 2014-12-21 DIAGNOSIS — Z951 Presence of aortocoronary bypass graft: Secondary | ICD-10-CM

## 2014-12-23 ENCOUNTER — Ambulatory Visit
Admission: RE | Admit: 2014-12-23 | Discharge: 2014-12-23 | Disposition: A | Discharge status: Home / Self Care | Payer: Medicare Other | Source: Ambulatory Visit | Attending: Cardiothoracic Surgery | Admitting: Cardiothoracic Surgery

## 2014-12-23 ENCOUNTER — Ambulatory Visit (INDEPENDENT_AMBULATORY_CARE_PROVIDER_SITE_OTHER): Payer: Medicare Other | Admitting: Cardiothoracic Surgery

## 2014-12-23 ENCOUNTER — Other Ambulatory Visit (INDEPENDENT_AMBULATORY_CARE_PROVIDER_SITE_OTHER): Payer: Medicare Other | Admitting: *Deleted

## 2014-12-23 ENCOUNTER — Other Ambulatory Visit: Payer: Self-pay | Admitting: *Deleted

## 2014-12-23 ENCOUNTER — Other Ambulatory Visit: Payer: Medicare Other

## 2014-12-23 ENCOUNTER — Encounter: Payer: Self-pay | Admitting: Cardiothoracic Surgery

## 2014-12-23 VITALS — BP 190/82 | HR 76 | Resp 20 | Ht 65.0 in | Wt 185.0 lb

## 2014-12-23 DIAGNOSIS — J90 Pleural effusion, not elsewhere classified: Secondary | ICD-10-CM | POA: Diagnosis not present

## 2014-12-23 DIAGNOSIS — Z951 Presence of aortocoronary bypass graft: Secondary | ICD-10-CM

## 2014-12-23 DIAGNOSIS — N183 Chronic kidney disease, stage 3 unspecified: Secondary | ICD-10-CM

## 2014-12-23 LAB — BASIC METABOLIC PANEL
BUN: 40 mg/dL — ABNORMAL HIGH (ref 6–23)
CO2: 27 mEq/L (ref 19–32)
Calcium: 9.9 mg/dL (ref 8.4–10.5)
Chloride: 101 mEq/L (ref 96–112)
Creatinine, Ser: 1.78 mg/dL — ABNORMAL HIGH (ref 0.40–1.20)
GFR: 29.78 mL/min — ABNORMAL LOW (ref >60.00)
Glucose, Bld: 104 mg/dL — ABNORMAL HIGH (ref 70–99)
Potassium: 4.6 mEq/L (ref 3.5–5.1)
Sodium: 138 mEq/L (ref 135–145)

## 2014-12-23 NOTE — Progress Notes (Signed)
PCP is Allie Dimmer, MD Referring Provider is Bartholomew Crews, MD  Chief Complaint  Patient presents with  . Pleural Effusion    6 week f/u with CXR    [id number] Pleurx catheter check The patient has bilateral Pleurx catheters placed after recurrent pleural effusions after CABG. Patient has chronic kidney disease, hypertension, diastolic heart disease. The catheter drainage has significantly reduced bilaterally. The left catheter drains only 150 cc per week. Right catheter drained over 200 cc per week. We will proceed with removing the left catheter as outpatient in the hospital short stay area next week. We'll continue weekly drainage of the right catheter. She is taking Lasix 20 mg once every 1-2 days.   Past Medical History  Diagnosis Date  . Hypertension   . Anginal pain   . Acute on chronic diastolic heart failure 123456  . Pleural effusion 07/26/2014    Post-operative - bilateral   . Complication of anesthesia     "one of the RX's they use for anesthesia makes me talk crazy"   . High cholesterol   . Type II diabetes mellitus   . Pneumonia 1990's X 1  . Anemia   . Fibromyalgia   . Arthritis     "right hip; hands" (08/04/2014)  . Chronic lower back pain   . CHF (congestive heart failure)   . Chronic kidney disease (CKD), stage III (moderate)   . Bilateral pleural effusion 08/04/2014    Past Surgical History  Procedure Laterality Date  . Left heart catheterization with coronary angiogram N/A 06/11/2014    Procedure: LEFT HEART CATHETERIZATION WITH CORONARY ANGIOGRAM;  Surgeon: Blane Ohara, MD;  Location: [facility] CATH LAB;  Service: Cardiovascular;  Laterality: N/A;  . Coronary artery bypass graft N/A 06/15/2014    Procedure: CORONARY ARTERY BYPASS GRAFTING (CABG);  Surgeon: Ivin Poot, MD;  Location: [place];  Service: Open Heart Surgery;  Laterality: N/A;  . Intraoperative transesophageal echocardiogram N/A 06/15/2014    Procedure: INTRAOPERATIVE  TRANSESOPHAGEAL ECHOCARDIOGRAM;  Surgeon: Ivin Poot, MD;  Location: [place];  Service: Open Heart Surgery;  Laterality: N/A;  . Tonsillectomy  ~ 1950  . Cholecystectomy  ~ 2005  . Abdominal hysterectomy  1980's  . Cardiac catheterization    . Appendectomy  1970's  . Back surgery    . Lumbar disc surgery  1980's X 1; 1990's X  2000's X 1  . Anterior cervical decomp/discectomy fusion  2000's  . Chest tube insertion Left 08/07/2014    Procedure: INSERTION PLEURAL DRAINAGE CATHETER;  Surgeon: Ivin Poot, MD;  Location: [place];  Service: Thoracic;  Laterality: Left;  . Chest tube insertion Right 08/12/2014    Procedure: INSERTION PLEURAL DRAINAGE CATHETER;  Surgeon: Ivin Poot, MD;  Location: [facility] OR;  Service: Thoracic;  Laterality: Right;    Family History  Problem Relation Age of Onset  . Stroke Father   . Diabetes Mother   . Hypertension Mother   . Alcoholism Brother   . Alcoholism Brother   . Heart disease Sister     Social History History  Substance Use Topics  . Smoking status: Never Smoker   . Smokeless tobacco: Never Used  . Alcohol Use: No    Current Outpatient Prescriptions  Medication Sig Dispense Refill  . amLODipine (NORVASC) 10 MG tablet Take 10 mg by mouth daily.   5  . aspirin EC 325 MG EC tablet Take 1 tablet (325 mg total) by mouth daily. 30 tablet 0  .  atorvastatin (LIPITOR) 40 MG tablet Take 1 tablet (40 mg total) by mouth daily at 6 PM.    . B Complex-C-Folic Acid (DIALYVITE PO) Take 1 tablet by mouth.    . Coconut Oil OIL by Does not apply route.    . ferrous Q000111Q C-folic acid (TRINSICON / FOLTRIN) capsule Take 1 capsule by mouth daily with breakfast. For one month then stop.    . furosemide (LASIX) 20 MG tablet Take 1 tablet (20 mg total) by mouth daily. (Patient taking differently: Take 20 mg by mouth every other day. ) 90 tablet 3  . GLUCERNA (GLUCERNA) LIQD Take 237 mLs by mouth daily.    . hydrALAZINE (APRESOLINE) 25 MG tablet  Take 3 tablets (75 mg total) by mouth every 8 (eight) hours. (Patient taking differently: Take 75 mg by mouth 2 (two) times daily. )    . HYDROcodone-acetaminophen (NORCO/VICODIN) 5-325 MG per tablet Take 1 tablet by mouth every 6 (six) hours as needed for moderate pain.    Marland Kitchen insulin glargine (LANTUS) 100 UNIT/ML injection Inject 10 Units into the skin at bedtime.    . insulin lispro (HUMALOG) 100 UNIT/ML KiwkPen Inject 0-12 Units into the skin 3 (three) times daily with meals.     . polyethylene glycol (MIRALAX / GLYCOLAX) packet Take 17 g by mouth daily as needed for mild constipation. 14 each 2  . primidone (MYSOLINE) 50 MG tablet Take 50 mg by mouth daily.   5  . sodium chloride (OCEAN) 0.65 % nasal spray Place into the nose.    . traMADol (ULTRAM) 50 MG tablet Take 1 tablet (50 mg total) by mouth every 6 (six) hours as needed for moderate pain. (Patient taking differently: Take 50 mg by mouth every 4 (four) hours as needed for moderate pain or severe pain. ) 30 tablet 0  . traZODone (DESYREL) 100 MG tablet Take 100 mg by mouth at bedtime.   0   No current facility-administered medications for this visit.    Allergies  Allergen Reactions  . Carvedilol Other (See Comments)    confusion  . Codeine Other (See Comments)    confusion  . Metoprolol Palpitations    Review of Systems   Patient complaining of back pain and is afraid she may need back surgery Difficulty walking Sternal superficial scar tenderness, no symptoms of angina or CHF  BP 190/82 mmHg  Pulse 76  Resp 20  Ht 5\' 5"  (1.651 m)  Wt 185 lb (83.915 kg)  BMI 30.79 kg/m2  SpO2 98% Physical Exam Alert and comfortable Lungs clear 12 catheter sites clean and dry Heart rhythm regular Trace pedal edema  Diagnostic Tests:  Today's chest x-ray independently reviewed showing small bilateral pleural effusions Impression: Significant reduction pleural effusion following. We will stop draining the left side or remove the  catheter and continue drainage of right Pleurx catheter on a weekly basis  Plan: Returned to short stay procedure room next week for Pleurx catheter removal on July 7  Len Childs, MD [facility] [phone]

## 2015-01-06 ENCOUNTER — Other Ambulatory Visit: Payer: Self-pay | Admitting: *Deleted

## 2015-01-06 ENCOUNTER — Telehealth: Payer: Self-pay | Admitting: *Deleted

## 2015-01-06 NOTE — Telephone Encounter (Signed)
Patient called to cancel her pleurex removal that was scheduled for Thursday 7/14 @ 12pm.  Patient said she was having back issues that she was having to address so she will call back when she is ready to re-schedule.  Dr. Prescott Gum made aware.

## 2015-01-07 ENCOUNTER — Encounter (HOSPITAL_COMMUNITY): Admission: RE | Payer: Self-pay | Source: Ambulatory Visit

## 2015-01-07 ENCOUNTER — Ambulatory Visit (HOSPITAL_COMMUNITY): Admission: RE | Admit: 2015-01-07 | Payer: Medicare Other | Source: Ambulatory Visit | Admitting: Cardiothoracic Surgery

## 2015-01-07 SURGERY — REMOVAL, CLOSED DRAINAGE CATHETER SYSTEM, PLEURAL
Anesthesia: Monitor Anesthesia Care | Laterality: Left

## 2015-02-04 ENCOUNTER — Ambulatory Visit (INDEPENDENT_AMBULATORY_CARE_PROVIDER_SITE_OTHER): Payer: Medicare Other | Admitting: Cardiology

## 2015-02-04 ENCOUNTER — Encounter: Payer: Self-pay | Admitting: Cardiology

## 2015-02-04 VITALS — BP 142/64 | HR 72 | Ht 65.0 in | Wt 191.0 lb

## 2015-02-04 DIAGNOSIS — E119 Type 2 diabetes mellitus without complications: Secondary | ICD-10-CM

## 2015-02-04 DIAGNOSIS — J90 Pleural effusion, not elsewhere classified: Secondary | ICD-10-CM | POA: Diagnosis not present

## 2015-02-04 DIAGNOSIS — I214 Non-ST elevation (NSTEMI) myocardial infarction: Secondary | ICD-10-CM

## 2015-02-04 DIAGNOSIS — I10 Essential (primary) hypertension: Secondary | ICD-10-CM

## 2015-02-04 DIAGNOSIS — Z951 Presence of aortocoronary bypass graft: Secondary | ICD-10-CM

## 2015-02-04 DIAGNOSIS — I5032 Chronic diastolic (congestive) heart failure: Secondary | ICD-10-CM

## 2015-02-04 DIAGNOSIS — E669 Obesity, unspecified: Secondary | ICD-10-CM

## 2015-02-04 DIAGNOSIS — E1169 Type 2 diabetes mellitus with other specified complication: Secondary | ICD-10-CM

## 2015-02-04 DIAGNOSIS — N183 Chronic kidney disease, stage 3 unspecified: Secondary | ICD-10-CM

## 2015-02-04 NOTE — Progress Notes (Signed)
CARDIOLOGY OFFICE NOTE  Date:  02/04/2015    [name] Date of Birth: 31-Oct-1940 Medical Record [id number]  PCP:  Allie Dimmer, MD  Cardiologist:  Martin[place]    Chief Complaint  Patient presents with  . Follow-up    3 month/ just had back procedure done, nerve block, really sore, has deteriorating disc disease  . Shortness of Breath    painful breathing/ hurts in diaphragm     History of Present Illness: Joyce [name] is a 74 y.o. female who presents today follow up of CAD.  She is s/p CABG back in December of 2015. She has a history of DM II, HTN, CKD III, CAD s/p CABG with LIMA to LAD, SVG to DX, SVG to ramus intermediate, and SVG to PD 05/2014. 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 Darcey Nora.   On follow up today she is getting along fairly well. Still has PleurX catheters in place bilaterally. Each drained 175 cc after 17 days. She notes some discomfort with catheters. No SOB or angina. Sternal incision is healed. She almost completed cardiac Rehab but then developed increased back pain. Underwent nerve block for this yesterday. This has limited her activity. Reports renal started her on lisinopril last week. Scheduled to see primary soon with repeat renal function. BP control has improved.    Past Medical History  Diagnosis Date  . Hypertension   . Anginal pain   . Acute on chronic diastolic heart failure 123456  . Pleural effusion 07/26/2014    Post-operative - bilateral   . Complication of anesthesia     "one of the RX's they use for anesthesia makes me talk crazy"   . High cholesterol   . Type II diabetes mellitus   . Pneumonia 1990's X 1  . Anemia   . Fibromyalgia   . Arthritis     "right hip; hands" (08/04/2014)  . Chronic lower back pain   . CHF (congestive heart failure)   . Chronic kidney disease (CKD), stage III (moderate)   .  Bilateral pleural effusion 08/04/2014    Past Surgical History  Procedure Laterality Date  . Left heart catheterization with coronary angiogram N/A 06/11/2014    Procedure: LEFT HEART CATHETERIZATION WITH CORONARY ANGIOGRAM;  Surgeon: Blane Ohara, MD;  Location: [facility] CATH LAB;  Service: Cardiovascular;  Laterality: N/A;  . Coronary artery bypass graft N/A 06/15/2014    Procedure: CORONARY ARTERY BYPASS GRAFTING (CABG);  Surgeon: Ivin Poot, MD;  Location: [place];  Service: Open Heart Surgery;  Laterality: N/A;  . Intraoperative transesophageal echocardiogram N/A 06/15/2014    Procedure: INTRAOPERATIVE TRANSESOPHAGEAL ECHOCARDIOGRAM;  Surgeon: Ivin Poot, MD;  Location: [place];  Service: Open Heart Surgery;  Laterality: N/A;  . Tonsillectomy  ~ 1950  . Cholecystectomy  ~ 2005  . Abdominal hysterectomy  1980's  . Cardiac catheterization    . Appendectomy  1970's  . Back surgery    . Lumbar disc surgery  1980's X 1; 1990's X  2000's X 1  . Anterior cervical decomp/discectomy fusion  2000's  . Chest tube insertion Left 08/07/2014    Procedure: INSERTION PLEURAL DRAINAGE CATHETER;  Surgeon: Ivin Poot, MD;  Location: [place];  Service: Thoracic;  Laterality: Left;  . Chest tube insertion Right 08/12/2014    Procedure: INSERTION PLEURAL DRAINAGE CATHETER;  Surgeon: Ivin Poot, MD;  Location: [place];  Service: Thoracic;  Laterality: Right;     Medications: Current Outpatient Prescriptions  Medication Sig Dispense Refill  . ACCU-CHEK AVIVA PLUS test strip Inject 1 strip as directed 4 (four) times daily.  2  . amLODipine (NORVASC) 10 MG tablet Take 10 mg by mouth daily.   5  . aspirin EC 325 MG EC tablet Take 1 tablet (325 mg total) by mouth daily. 30 tablet 0  . atorvastatin (LIPITOR) 40 MG tablet Take 1 tablet (40 mg total) by mouth daily at 6 PM.    . B Complex-C-Folic Acid (DIALYVITE PO) Take 1 tablet by mouth.    . Coconut Oil OIL by Does not apply route.    . ferrous  Q000111Q C-folic acid (TRINSICON / FOLTRIN) capsule Take 1 capsule by mouth daily with breakfast. For one month then stop.    . furosemide (LASIX) 20 MG tablet Take 1 tablet (20 mg total) by mouth daily. (Patient taking differently: Take 20 mg by mouth every other day. ) 90 tablet 3  . GLUCERNA (GLUCERNA) LIQD Take 237 mLs by mouth daily.    . hydrALAZINE (APRESOLINE) 25 MG tablet Take 3 tablets (75 mg total) by mouth every 8 (eight) hours. (Patient taking differently: Take 75 mg by mouth 2 (two) times daily. )    . HYDROcodone-acetaminophen (NORCO/VICODIN) 5-325 MG per tablet Take 1 tablet by mouth every 6 (six) hours as needed for moderate pain.    Marland Kitchen insulin glargine (LANTUS) 100 UNIT/ML injection Inject 10 Units into the skin at bedtime.    . insulin lispro (HUMALOG) 100 UNIT/ML KiwkPen Inject 0-12 Units into the skin 3 (three) times daily with meals.     . Insulin Syringe-Needle U-100 (INSULIN SYRINGE .5CC/30GX5/16") 30G X 5/16" 0.5 ML MISC Inject 0.5 mLs as directed daily.    . Insulin Syringe-Needle U-100 (INSULIN SYRINGE .5CC/30GX5/16") 30G X 5/16" 0.5 ML MISC Inject 0.5 mLs as directed 3 (three) times daily.  3  . lisinopril (PRINIVIL,ZESTRIL) 20 MG tablet Take 20 mg by mouth daily.    . polyethylene glycol (MIRALAX / GLYCOLAX) packet Take 17 g by mouth daily as needed for mild constipation. 14 each 2  . primidone (MYSOLINE) 50 MG tablet Take 50 mg by mouth daily.   5  . sodium chloride (OCEAN) 0.65 % nasal spray Place into the nose.    . traMADol (ULTRAM) 50 MG tablet Take 1 tablet (50 mg total) by mouth every 6 (six) hours as needed for moderate pain. (Patient taking differently: Take 50 mg by mouth every 4 (four) hours as needed for moderate pain or severe pain. ) 30 tablet 0  . traZODone (DESYREL) 100 MG tablet Take 100 mg by mouth at bedtime.   0   No current facility-administered medications for this visit.    Allergies: Allergies  Allergen Reactions  . Carvedilol  Other (See Comments)    confusion  . Codeine Other (See Comments)    confusion  . Metoprolol Palpitations    Social History: The patient  reports that she has never smoked. She has never used smokeless tobacco. She reports that she does not drink alcohol or use illicit drugs.   Family History: The patient's family history includes Alcoholism in her brother and brother; Diabetes in her mother; Heart disease in her sister; Hypertension in her mother; Stroke in her father.   Review of Systems: Please see the history of present illness. Still has some salivary discomfort on the left after eating. Seen by ENT and  CT was negative.   All other systems are reviewed and negative.   Physical Exam: VS:  BP 142/64 mmHg  Pulse 72  Ht 5\' 5"  (1.651 m)  Wt 86.637 kg (191 lb)  BMI 31.78 kg/m2 .  BMI Body mass index is 31.78 kg/(m^2).  Wt Readings from Last 3 Encounters:  02/04/15 86.637 kg (191 lb)  12/23/14 83.915 kg (185 lb)  11/04/14 84.823 kg (187 lb)    General: Pleasant. Well developed, well nourished and in no acute distress.  HEENT: Normal. Neck: Supple, no JVD, carotid bruits, or masses noted.  Cardiac: Regular rate and rhythm. No murmurs, rubs, or gallops. No edema.  Respiratory:  Lungs are clear to auscultation - decreased in both bases but moving air. She has normal work of breathing. Pleurex tubes in place.  GI: Soft and nontender.  MS: No deformity or atrophy. Gait and ROM intact. Skin: Warm and dry. Color is normal.  Neuro:  Strength and sensation are intact and no gross focal deficits noted.  Psych: Alert, appropriate and with normal affect.   LABORATORY DATA:  EKG:  EKG is not ordered today.   Lab Results  Component Value Date   WBC 5.3 09/23/2014   HGB 11.3* 09/23/2014   HCT 33.9* 09/23/2014   PLT 168.0 09/23/2014   GLUCOSE 104* 12/23/2014   CHOL 105 10/23/2014   TRIG 121.0 10/23/2014   HDL 47.20 10/23/2014   LDLCALC 34 10/23/2014   ALT 16 10/23/2014   AST 19  10/23/2014   NA 138 12/23/2014   K 4.6 12/23/2014   CL 101 12/23/2014   CREATININE 1.78* 12/23/2014   BUN 40* 12/23/2014   CO2 27 12/23/2014   TSH 3.700 06/13/2014   INR 1.05 08/04/2014   HGBA1C 5.9* 06/13/2014    BNP (last 3 results)  Recent Labs  07/26/14 1015 08/04/14 0112  BNP 97.0 159.8*    ProBNP (last 3 results) No results for input(s): PROBNP in the last 8760 hours.   Other Studies Reviewed Today:  Echo Study Conclusions from 07/2014  - Left ventricle: The cavity size was normal. Systolic function was normal. The estimated ejection fraction was in the range of 55% to 60%. Although no diagnostic regional wall motion abnormality was identified, this possibility cannot be completely excluded on the basis of this study. Doppler parameters are consistent with abnormal left ventricular relaxation (grade 1 diastolic dysfunction). - Ventricular septum: Septal motion showed paradox. - Atrial septum: No defect or patent foramen ovale was identified. - Pericardium, extracardiac: There was a left pleural effusion.  Assessment/Plan: 1. CAD - post CABG -clinically doing well. CV risk factor modification encouraged.   2. Recurrent pleural effusions - she still has bilateral pleurex tubes in place  - followed by Dr. Prescott Gum.  3. HTN - BP improved on current regimen. Need to watch renal function closely on lisinopril.   4. DM  5. HLD - excellent control on lipitor.    Current medicines are reviewed with the patient today.  The patient does not have concerns regarding medicines other than what has been noted above.  The following changes have been made:  See above.  Labs/ tests ordered today include:    No orders of the defined types were placed in this encounter.     Disposition:   FU with Dr. Martinique in 6  months.   Patient is agreeable to this plan and will call if any problems develop in the interim.   Signed: [name] Martinique MD,  [facility]  02/04/2015

## 2015-02-04 NOTE — Patient Instructions (Signed)
Continue your current therapy  I will see you in 6 months.   

## 2015-02-08 ENCOUNTER — Other Ambulatory Visit: Payer: Self-pay | Admitting: Cardiothoracic Surgery

## 2015-02-08 DIAGNOSIS — J90 Pleural effusion, not elsewhere classified: Secondary | ICD-10-CM

## 2015-02-10 ENCOUNTER — Ambulatory Visit (INDEPENDENT_AMBULATORY_CARE_PROVIDER_SITE_OTHER): Payer: Medicare Other | Admitting: Cardiothoracic Surgery

## 2015-02-10 ENCOUNTER — Ambulatory Visit
Admission: RE | Admit: 2015-02-10 | Discharge: 2015-02-10 | Disposition: A | Discharge status: Home / Self Care | Payer: Medicare Other | Source: Ambulatory Visit | Attending: Cardiothoracic Surgery | Admitting: Cardiothoracic Surgery

## 2015-02-10 ENCOUNTER — Ambulatory Visit: Payer: Medicare Other | Admitting: Cardiothoracic Surgery

## 2015-02-10 ENCOUNTER — Encounter: Payer: Self-pay | Admitting: Cardiothoracic Surgery

## 2015-02-10 ENCOUNTER — Other Ambulatory Visit: Payer: Self-pay | Admitting: *Deleted

## 2015-02-10 VITALS — BP 161/73 | HR 67 | Resp 18 | Ht 65.0 in | Wt 191.0 lb

## 2015-02-10 DIAGNOSIS — Z951 Presence of aortocoronary bypass graft: Secondary | ICD-10-CM | POA: Diagnosis not present

## 2015-02-10 DIAGNOSIS — J90 Pleural effusion, not elsewhere classified: Secondary | ICD-10-CM | POA: Diagnosis not present

## 2015-02-10 MED ORDER — TALC 5 G PL SUSR
3.0000 g | INTRAPLEURAL | Status: AC
  Administered 2015-02-11: 3 g via INTRAPLEURAL
  Filled 2015-02-10: qty 3

## 2015-02-10 NOTE — Progress Notes (Signed)
PCP is Allie Dimmer, MD Referring Provider is Bartholomew Crews, MD  Chief Complaint  Patient presents with  . Follow-up    bilateral pleurX catheters    HPI: Routine follow for bilateral Pleurx catheters placed forrecurrent serous effusions. This follows multivessel CABG. She has diastolic heart failure. She has no symptoms of shortness of breath now. Pleurx drainage is minimal approximately 150 cc per week on each side. Chest x-ray today shows no significant reaccumulation of pleural effusions. We will plan on removing the Pleurx catheters in the hospital outpatient area.  Patient currently complaining about lower back pain and is seen a back specialist.  Past Medical History  Diagnosis Date  . Hypertension   . Anginal pain   . Acute on chronic diastolic heart failure 123456  . Pleural effusion 07/26/2014    Post-operative - bilateral   . Complication of anesthesia     "one of the RX's they use for anesthesia makes me talk crazy"   . High cholesterol   . Type II diabetes mellitus   . Pneumonia 1990's X 1  . Anemia   . Fibromyalgia   . Arthritis     "right hip; hands" (08/04/2014)  . Chronic lower back pain   . CHF (congestive heart failure)   . Chronic kidney disease (CKD), stage III (moderate)   . Bilateral pleural effusion 08/04/2014    Past Surgical History  Procedure Laterality Date  . Left heart catheterization with coronary angiogram N/A 06/11/2014    Procedure: LEFT HEART CATHETERIZATION WITH CORONARY ANGIOGRAM;  Surgeon: Blane Ohara, MD;  Location: [facility] CATH LAB;  Service: Cardiovascular;  Laterality: N/A;  . Coronary artery bypass graft N/A 06/15/2014    Procedure: CORONARY ARTERY BYPASS GRAFTING (CABG);  Surgeon: Ivin Poot, MD;  Location: [place];  Service: Open Heart Surgery;  Laterality: N/A;  . Intraoperative transesophageal echocardiogram N/A 06/15/2014    Procedure: INTRAOPERATIVE TRANSESOPHAGEAL ECHOCARDIOGRAM;  Surgeon: Ivin Poot, MD;   Location: [place];  Service: Open Heart Surgery;  Laterality: N/A;  . Tonsillectomy  ~ 1950  . Cholecystectomy  ~ 2005  . Abdominal hysterectomy  1980's  . Cardiac catheterization    . Appendectomy  1970's  . Back surgery    . Lumbar disc surgery  1980's X 1; 1990's X  2000's X 1  . Anterior cervical decomp/discectomy fusion  2000's  . Chest tube insertion Left 08/07/2014    Procedure: INSERTION PLEURAL DRAINAGE CATHETER;  Surgeon: Ivin Poot, MD;  Location: [place];  Service: Thoracic;  Laterality: Left;  . Chest tube insertion Right 08/12/2014    Procedure: INSERTION PLEURAL DRAINAGE CATHETER;  Surgeon: Ivin Poot, MD;  Location: [facility];  Service: Thoracic;  Laterality: Right;    Family History  Problem Relation Age of Onset  . Stroke Father   . Diabetes Mother   . Hypertension Mother   . Alcoholism Brother   . Alcoholism Brother   . Heart disease Sister     Social History Social History  Substance Use Topics  . Smoking status: Never Smoker   . Smokeless tobacco: Never Used  . Alcohol Use: No    Current Outpatient Prescriptions  Medication Sig Dispense Refill  . ACCU-CHEK AVIVA PLUS test strip Inject 1 strip as directed 4 (four) times daily.  2  . amLODipine (NORVASC) 10 MG tablet Take 10 mg by mouth daily.   5  . aspirin EC 325 MG EC tablet Take 1 tablet (325 mg total) by  mouth daily. 30 tablet 0  . atorvastatin (LIPITOR) 40 MG tablet Take 1 tablet (40 mg total) by mouth daily at 6 PM.    . B Complex-C-Folic Acid (DIALYVITE PO) Take 1 tablet by mouth.    . Coconut Oil OIL by Does not apply route.    . ferrous Q000111Q C-folic acid (TRINSICON / FOLTRIN) capsule Take 1 capsule by mouth daily with breakfast. For one month then stop.    . furosemide (LASIX) 20 MG tablet Take 1 tablet (20 mg total) by mouth daily. (Patient taking differently: Take 20 mg by mouth every other day. ) 90 tablet 3  . GLUCERNA (GLUCERNA) LIQD Take 237 mLs by mouth daily.    .  hydrALAZINE (APRESOLINE) 25 MG tablet Take 3 tablets (75 mg total) by mouth every 8 (eight) hours. (Patient taking differently: Take 75 mg by mouth 2 (two) times daily. )    . HYDROcodone-acetaminophen (NORCO/VICODIN) 5-325 MG per tablet Take 1 tablet by mouth every 6 (six) hours as needed for moderate pain.    Marland Kitchen insulin glargine (LANTUS) 100 UNIT/ML injection Inject 10 Units into the skin at bedtime.    . insulin lispro (HUMALOG) 100 UNIT/ML KiwkPen Inject 0-12 Units into the skin 3 (three) times daily with meals.     Marland Kitchen lisinopril (PRINIVIL,ZESTRIL) 5 MG tablet Take 5 mg by mouth daily.    . polyethylene glycol (MIRALAX / GLYCOLAX) packet Take 17 g by mouth daily as needed for mild constipation. 14 each 2  . primidone (MYSOLINE) 50 MG tablet Take 50 mg by mouth daily.   5  . sodium chloride (OCEAN) 0.65 % nasal spray Place into the nose.    . traMADol (ULTRAM) 50 MG tablet Take 1 tablet (50 mg total) by mouth every 6 (six) hours as needed for moderate pain. (Patient taking differently: Take 50 mg by mouth every 4 (four) hours as needed for moderate pain or severe pain. ) 30 tablet 0  . traZODone (DESYREL) 100 MG tablet Take 100 mg by mouth at bedtime.   0  . Insulin Syringe-Needle U-100 (INSULIN SYRINGE .5CC/30GX5/16") 30G X 5/16" 0.5 ML MISC Inject 0.5 mLs as directed daily.    . Insulin Syringe-Needle U-100 (INSULIN SYRINGE .5CC/30GX5/16") 30G X 5/16" 0.5 ML MISC Inject 0.5 mLs as directed 3 (three) times daily.  3   No current facility-administered medications for this visit.   Facility-Administered Medications Ordered in Other Visits  Medication Dose Route Frequency Provider Last Rate Last Dose  . talc 3 g in sodium chloride 0.9 % syringe  3 g Intrapleural To OR Ivin Poot, MD        Allergies  Allergen Reactions  . Carvedilol Other (See Comments)    confusion  . Codeine Other (See Comments)    confusion  . Metoprolol Palpitations    Review of Systems    Review of  Systems  General:  No weight loss   no fever   no decreased energy  no night sweats. No drainage from Pleurx catheter sites Cardiac: - Chest pain with exertion  -resting chest pain  - SOB with exertion   -Orthopnea                -  PND  -ankle edema  -syncope Pulmonary:  no dyspnea,no cough, no productive cough no home oxygen no hemoptysis GI: no difficulty swallowing  no GERD no jaundice  no melena  no hematemesis no        abdominal  pain GU:  no dysuria  no hematuria  no frequent UTI no BPH Vascular:  no claudication  No TIA  No varicose veins no DVT Neuro:  no sroke no seizures no TIA no head trauma no vision changes Musculoskeletal:positive for low back pain,  Skin: no rash  no skin ulceration  no skin cancer Endocrine: diabetes  no thyroid didease Hematologic: no easy bruising  no blood transfusions  no frequent epistaxis ENT : no painful teeth no dentures no loose teeth Psych : no anxiety  no depression  no psych hospitalizations        BP 161/73 mmHg  Pulse 67  Resp 18  Ht 5\' 5"  (1.651 m)  Wt 191 lb (86.637 kg)  BMI 31.78 kg/m2  SpO2 98% Physical Exam     Physical Exam  General: middle-aged female alert and comfortable no distressHEENT: Normocephalic pupils equal , dentition adequate Neck: Supple without JVD, adenopathy, or bruit Chest: Clear to auscultation, symmetrical breath sounds, no rhonchi, no tenderness             or deformity Cardiovascular: Regular rate and rhythm, no murmur, no gallop, peripheral pulses             palpable in all extremities Abdomen:  Soft, nontender, no palpable mass or organomegaly Extremities: Warm, well-perfused, no clubbing cyanosis edema or tenderness,              no venous stasis changes of the legs Rectal/GU: Deferred Neuro: Grossly non--focal and symmetrical throughout Skin: Clean and dry without rash or ulceration   Diagnostic Tests: Chest x-ray shows Pleurx catheters in good position No significant pleural effusion is  present  Impression: Resolution of pleural fluid drainage after several months of therapy with Pleurx catheters  Plan:remove Pleurx catheters in clinic later this week. We will inject the catheters with 2 g of talc each side. Prior to removal.   Len Childs, MD [facility] [phone]

## 2015-02-11 ENCOUNTER — Ambulatory Visit (HOSPITAL_COMMUNITY)
Admission: RE | Admit: 2015-02-11 | Discharge: 2015-02-11 | Disposition: A | Discharge status: Home / Self Care | Payer: Medicare Other | Source: Ambulatory Visit | Attending: Cardiothoracic Surgery | Admitting: Cardiothoracic Surgery

## 2015-02-11 ENCOUNTER — Encounter (HOSPITAL_COMMUNITY)
Admission: RE | Disposition: A | Discharge status: Home / Self Care | Payer: Self-pay | Source: Ambulatory Visit | Attending: Cardiothoracic Surgery

## 2015-02-11 DIAGNOSIS — Z4682 Encounter for fitting and adjustment of non-vascular catheter: Secondary | ICD-10-CM | POA: Insufficient documentation

## 2015-02-11 DIAGNOSIS — J90 Pleural effusion, not elsewhere classified: Secondary | ICD-10-CM | POA: Insufficient documentation

## 2015-02-11 HISTORY — PX: REMOVAL OF PLEURAL DRAINAGE CATHETER: SHX5080

## 2015-02-11 HISTORY — PX: TALC PLEURODESIS: SHX2506

## 2015-02-11 SURGERY — REMOVAL, CLOSED DRAINAGE CATHETER SYSTEM, PLEURAL
Anesthesia: Monitor Anesthesia Care | Laterality: Bilateral

## 2015-02-11 MED ORDER — LIDOCAINE HCL (PF) 1 % IJ SOLN
INTRAMUSCULAR | Status: AC
  Administered 2015-02-11: 12:00:00
  Filled 2015-02-11: qty 10

## 2015-02-11 MED ORDER — TALC 5 G PL SUSR
3.0000 g | Freq: Once | INTRAVENOUS | Status: AC
  Administered 2015-02-11: 3 g via INTRAPLEURAL
  Filled 2015-02-11: qty 3

## 2015-02-11 NOTE — Brief Op Note (Signed)
02/11/2015  12:53 PM  PATIENT:  Joyce [name]  74 y.o. female  PRE-OPERATIVE DIAGNOSIS:  Bilateral pleural effusions  POST-OPERATIVE DIAGNOSIS:  Bilateral pleural effusions   PROCEDURE:  Procedure(s): REMOVAL OF PLEURAL DRAINAGE CATHETER (Bilateral) TALC PLEURODESIS (Bilateral)  PHYSICIAN ASSISTANT: Suzzanne Cloud, PA-C   ANESTHESIA:   local    Procedure Note: 3 g sterile talc in slurry instilled in right and left PleuRx catheters.  Right and left PleuRx sites prepped and draped in sterile fashion and anesthetized with 3 ml 1% xylocaine on each side.  Cuffs freed with blunt dissection and PleuRx catheters removed without difficulty.  Areas closed with 3-0 nylon sutures.  Pt tolerated well.

## 2015-02-16 ENCOUNTER — Encounter (HOSPITAL_COMMUNITY): Payer: Self-pay | Admitting: Cardiothoracic Surgery

## 2015-02-23 ENCOUNTER — Other Ambulatory Visit: Payer: Self-pay | Admitting: Cardiothoracic Surgery

## 2015-02-23 DIAGNOSIS — Z951 Presence of aortocoronary bypass graft: Secondary | ICD-10-CM

## 2015-02-24 ENCOUNTER — Ambulatory Visit: Payer: Medicare Other | Admitting: Cardiothoracic Surgery

## 2015-03-10 ENCOUNTER — Ambulatory Visit: Payer: Medicare Other | Admitting: Cardiothoracic Surgery

## 2015-03-11 ENCOUNTER — Ambulatory Visit
Admission: RE | Admit: 2015-03-11 | Discharge: 2015-03-11 | Disposition: A | Discharge status: Home / Self Care | Payer: Medicare Other | Source: Ambulatory Visit | Attending: Cardiothoracic Surgery | Admitting: Cardiothoracic Surgery

## 2015-03-11 ENCOUNTER — Encounter: Payer: Self-pay | Admitting: Cardiothoracic Surgery

## 2015-03-11 ENCOUNTER — Ambulatory Visit (INDEPENDENT_AMBULATORY_CARE_PROVIDER_SITE_OTHER): Payer: Medicare Other | Admitting: Cardiothoracic Surgery

## 2015-03-11 VITALS — BP 140/68 | HR 79 | Resp 16 | Ht 65.0 in | Wt 184.0 lb

## 2015-03-11 DIAGNOSIS — Z951 Presence of aortocoronary bypass graft: Secondary | ICD-10-CM | POA: Diagnosis not present

## 2015-03-11 DIAGNOSIS — J90 Pleural effusion, not elsewhere classified: Secondary | ICD-10-CM | POA: Diagnosis not present

## 2015-03-11 DIAGNOSIS — Z9289 Personal history of other medical treatment: Secondary | ICD-10-CM

## 2015-03-11 DIAGNOSIS — Z9889 Other specified postprocedural states: Secondary | ICD-10-CM

## 2015-03-11 NOTE — Progress Notes (Signed)
PCP is Allie Dimmer, MD Referring Provider is Bartholomew Crews, MD  Chief Complaint  Patient presents with  . Pleural Effusion    2 week f/u with CXR s/p bilateral pleurX catheter removal 02/09/15  . 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 The patient had a rather severe post talc inflammatory response with weakness and pain and required hospitalization at [facility] and then 2 weeks at a skilled nursing facility at [facility]. She presents today for followup with a chest x-ray. She states she has dyspnea with exertion. No angina. Chest x-ray shows no significant pleural effusion on either side. She is taking Lasix 20 mg a day. This will be decreased to 20 mg every other day. On exam her breath sounds are clear and she is in a sinus rhythm.  Ambulating the patient in the hallway does not alter her room air saturation of 98%. She does get dyspnea with walking down the hallway.  The patient probably has some residual pulmonary inflammation from the talc injection placed to create adhesions in the pleural space as well as significant deconditioning that she has been dealing with multiple medical problems for the past 6-9 months following surgery.  I recommended to her a daily walk up 10-15 minutes and have encouraged her to establish a regular schedule at the local fitness center for aerobic exercise.   Past Medical History  Diagnosis Date  . Hypertension   . Anginal pain   . Acute on chronic diastolic heart failure 123456  . Pleural effusion 07/26/2014    Post-operative - bilateral   . Complication of anesthesia     "one of the RX's they use for anesthesia makes me talk crazy"   . High cholesterol   . Type II diabetes mellitus   . Pneumonia 1990's X 1  . Anemia   . Fibromyalgia   . Arthritis     "right  hip; hands" (08/04/2014)  . Chronic lower back pain   . CHF (congestive heart failure)   . Chronic kidney disease (CKD), stage III (moderate)   . Bilateral pleural effusion 08/04/2014    Past Surgical History  Procedure Laterality Date  . Left heart catheterization with coronary angiogram N/A 06/11/2014    Procedure: LEFT HEART CATHETERIZATION WITH CORONARY ANGIOGRAM;  Surgeon: Blane Ohara, MD;  Location: [facility] CATH LAB;  Service: Cardiovascular;  Laterality: N/A;  . Coronary artery bypass graft N/A 06/15/2014    Procedure: CORONARY ARTERY BYPASS GRAFTING (CABG);  Surgeon: Ivin Poot, MD;  Location: [place];  Service: Open Heart Surgery;  Laterality: N/A;  . Intraoperative transesophageal echocardiogram N/A 06/15/2014    Procedure: INTRAOPERATIVE TRANSESOPHAGEAL ECHOCARDIOGRAM;  Surgeon: Ivin Poot, MD;  Location: [place];  Service: Open Heart Surgery;  Laterality: N/A;  . Tonsillectomy  ~ 1950  . Cholecystectomy  ~ 2005  . Abdominal hysterectomy  1980's  . Cardiac catheterization    . Appendectomy  1970's  . Back surgery    . Lumbar disc surgery  1980's X 1; 1990's X  2000's X 1  . Anterior cervical decomp/discectomy fusion  2000's  . Chest tube insertion Left 08/07/2014    Procedure: INSERTION PLEURAL DRAINAGE CATHETER;  Surgeon: Ivin Poot, MD;  Location: [place];  Service: Thoracic;  Laterality: Left;  . Chest tube insertion Right 08/12/2014    Procedure:  INSERTION PLEURAL DRAINAGE CATHETER;  Surgeon: Ivin Poot, MD;  Location: [place];  Service: Thoracic;  Laterality: Right;  . Removal of pleural drainage catheter Bilateral 02/11/2015    Procedure: REMOVAL OF PLEURAL DRAINAGE CATHETER;  Surgeon: Ivin Poot, MD;  Location: [place];  Service: Thoracic;  Laterality: Bilateral;  . Talc pleurodesis Bilateral 02/11/2015    Procedure: Pietro Cassis;  Surgeon: Ivin Poot, MD;  Location: [facility] OR;  Service: Thoracic;  Laterality: Bilateral;    Family History  Problem  Relation Age of Onset  . Stroke Father   . Diabetes Mother   . Hypertension Mother   . Alcoholism Brother   . Alcoholism Brother   . Heart disease Sister     Social History Social History  Substance Use Topics  . Smoking status: Never Smoker   . Smokeless tobacco: Never Used  . Alcohol Use: No    Current Outpatient Prescriptions  Medication Sig Dispense Refill  . ACCU-CHEK AVIVA PLUS test strip Inject 1 strip as directed 4 (four) times daily.  2  . amLODipine (NORVASC) 10 MG tablet Take 10 mg by mouth daily.   5  . aspirin EC 325 MG EC tablet Take 1 tablet (325 mg total) by mouth daily. 30 tablet 0  . atorvastatin (LIPITOR) 40 MG tablet Take 1 tablet (40 mg total) by mouth daily at 6 PM.    . B Complex-C-Folic Acid (DIALYVITE PO) Take 1 tablet by mouth.    . Coconut Oil OIL by Does not apply route.    . ferrous Q000111Q C-folic acid (TRINSICON / FOLTRIN) capsule Take 1 capsule by mouth daily with breakfast. For one month then stop.    . furosemide (LASIX) 20 MG tablet Take 1 tablet (20 mg total) by mouth daily. (Patient taking differently: Take 20 mg by mouth every other day. ) 90 tablet 3  . GLUCERNA (GLUCERNA) LIQD Take 237 mLs by mouth daily.    . hydrALAZINE (APRESOLINE) 25 MG tablet Take 3 tablets (75 mg total) by mouth every 8 (eight) hours. (Patient taking differently: Take 75 mg by mouth 2 (two) times daily. )    . HYDROcodone-acetaminophen (NORCO/VICODIN) 5-325 MG per tablet Take 1 tablet by mouth every 6 (six) hours as needed for moderate pain.    Marland Kitchen insulin glargine (LANTUS) 100 UNIT/ML injection Inject 10 Units into the skin at bedtime.    . insulin lispro (HUMALOG) 100 UNIT/ML KiwkPen Inject 0-12 Units into the skin 3 (three) times daily with meals.     . Insulin Syringe-Needle U-100 (INSULIN SYRINGE .5CC/30GX5/16") 30G X 5/16" 0.5 ML MISC Inject 0.5 mLs as directed daily.    . Insulin Syringe-Needle U-100 (INSULIN SYRINGE .5CC/30GX5/16") 30G X 5/16" 0.5 ML  MISC Inject 0.5 mLs as directed 3 (three) times daily.  3  . lisinopril (PRINIVIL,ZESTRIL) 5 MG tablet Take 5 mg by mouth 2 (two) times daily.     . polyethylene glycol (MIRALAX / GLYCOLAX) packet Take 17 g by mouth daily as needed for mild constipation. 14 each 2  . primidone (MYSOLINE) 50 MG tablet Take 50 mg by mouth daily.   5  . sodium chloride (OCEAN) 0.65 % nasal spray Place into the nose.    . traMADol (ULTRAM) 50 MG tablet Take 1 tablet (50 mg total) by mouth every 6 (six) hours as needed for moderate pain. (Patient taking differently: Take 50 mg by mouth every 4 (four) hours as needed for moderate pain or severe pain. ) 30  tablet 0  . traZODone (DESYREL) 100 MG tablet Take 100 mg by mouth at bedtime.   0   No current facility-administered medications for this visit.    Allergies  Allergen Reactions  . Carvedilol Other (See Comments)    confusion  . Codeine Other (See Comments)    confusion  . Metoprolol Palpitations    Review of Systems         Review of Systems :  [ y ] = yes, [  ] = no        General :  Weight gain [   ]    Weight loss  [   ]  Fatigue [ yes ]  Fever [  ]  Chills  [  ]                                Weakness  [ yes ]           Cardiac :  Chest pain/ pressure [  ]  Resting SOB [  ] exertional SOB [ yes ]                        Orthopnea [  ]  Pedal edema  [  ]  Palpitations [  ] Syncope/presyncope [ ]                         Paroxysmal nocturnal dyspnea [  ]        Pulmonary : cough [  ]  wheezing [  ]  Hemoptysis [  ] Sputum [  ] Snoring [  ]recent upper respiratory and nasal congestion-sinusitis                              Pneumothorax [  ]  Sleep apnea [  ]       GI : Vomiting [  ]  Dysphagia [  ]  Melena  [  ]  Abdominal pain [  ] BRBPR [  ]              Heart burn [  ]  Constipation [  ] Diarrhea  [  ] Colonoscopy [  ]       GU : Hematuria [  ]  Dysuria [  ]  Nocturia [  ] UTI's [  ]       Vascular : Claudication [  ]  Rest pain [  ]  DVT [  ] Vein  stripping [  ] leg ulcers [  ]                          TIA [  ] Stroke [  ]  Varicose veins [  ]       NEURO :  Headaches  [  ] Seizures [  ] Vision changes [  ] Paresthesias [  ]       Musculoskeletal :  Arthritis [  ] Gout  [  ]  Back pain [  ]  Joint pain [  ]       Skin :  Rash [  ]  Melanoma [  ]        Heme : Bleeding problems [  ]Clotting Disorders [  ] Anemia [  ]  Blood Transfusion [ ]        Endocrine : Diabetes [ yes ] Thyroid Disorder  [  ]       Psych : Depression [  ]  Anxiety [  ]  Psych hospitalizations [  ]                                               BP 140/68 mmHg  Pulse 79  Resp 16  Ht 5\' 5"  (1.651 m)  Wt 184 lb (83.462 kg)  BMI 30.62 kg/m2  SpO2 98% Physical Exam     Physical Exam  General: very pleasant comfortable Caucasian female no acute distress HEENT: Normocephalic pupils equal , dentition adequate Neck: Supple without JVD, adenopathy, or bruit Chest: Clear to auscultation, symmetrical breath sounds, no rhonchi, no tenderness             or deformity Cardiovascular: Regular rate and rhythm, no murmur, no gallop, peripheral pulses             palpable in all extremities Abdomen:  Soft, nontender, no palpable mass or organomegaly Extremities: Warm, well-perfused, no clubbing cyanosis edema or tenderness,              no venous stasis changes of the legs Rectal/GU: Deferred Neuro: Grossly non--focal and symmetrical throughout Skin: Clean and dry without rash or ulceration   Diagnostic Tests: Chest x-ray reviewed showing COPD but no evidence of pleural effusion or heart failure  Impression: No reaching relation of pleural effusion after removal bilateral Pleurx catheters after talc injection.  I believe her shortness of breath should improve with reconditioning and time.  Plan:return withchest x-ray in one month.reduce Lasix to every other day.   Len Childs, MD [facility] [phone]

## 2015-03-17 ENCOUNTER — Ambulatory Visit: Payer: Medicare Other | Admitting: Cardiothoracic Surgery

## 2015-04-07 ENCOUNTER — Ambulatory Visit (INDEPENDENT_AMBULATORY_CARE_PROVIDER_SITE_OTHER): Payer: Medicare Other | Admitting: Cardiothoracic Surgery

## 2015-04-07 ENCOUNTER — Ambulatory Visit
Admission: RE | Admit: 2015-04-07 | Discharge: 2015-04-07 | Disposition: A | Discharge status: Home / Self Care | Payer: Medicare Other | Source: Ambulatory Visit | Attending: Cardiothoracic Surgery | Admitting: Cardiothoracic Surgery

## 2015-04-07 ENCOUNTER — Other Ambulatory Visit: Payer: Self-pay | Admitting: Cardiothoracic Surgery

## 2015-04-07 VITALS — BP 145/74 | HR 77 | Resp 19 | Ht 65.0 in | Wt 185.0 lb

## 2015-04-07 DIAGNOSIS — I214 Non-ST elevation (NSTEMI) myocardial infarction: Secondary | ICD-10-CM

## 2015-04-07 DIAGNOSIS — J90 Pleural effusion, not elsewhere classified: Secondary | ICD-10-CM | POA: Diagnosis not present

## 2015-04-07 NOTE — Progress Notes (Signed)
PCP is Allie Dimmer, MD Referring Provider is Bartholomew Crews, MD  Chief Complaint  Patient presents with  . Follow-up    1 month f/u on bilateral pleural effusions s/p pleurex removal    HPI:   Past Medical History  Diagnosis Date  . Hypertension   . Anginal pain   . Acute on chronic diastolic heart failure [id number]  . Pleural effusion 07/26/2014    Post-operative - bilateral   . Complication of anesthesia     "one of the RX's they use for anesthesia makes me talk crazy"   . High cholesterol   . Type II diabetes mellitus   . Pneumonia 1990's X 1  . Anemia   . Fibromyalgia   . Arthritis     "right hip; hands" (08/04/2014)  . Chronic lower back pain   . CHF (congestive heart failure)   . Chronic kidney disease (CKD), stage III (moderate)   . Bilateral pleural effusion 08/04/2014    Past Surgical History  Procedure Laterality Date  . Left heart catheterization with coronary angiogram N/A 06/11/2014    Procedure: LEFT HEART CATHETERIZATION WITH CORONARY ANGIOGRAM;  Surgeon: Blane Ohara, MD;  Location: [facility] CATH LAB;  Service: Cardiovascular;  Laterality: N/A;  . Coronary artery bypass graft N/A 06/15/2014    Procedure: CORONARY ARTERY BYPASS GRAFTING (CABG);  Surgeon: Ivin Poot, MD;  Location: [place];  Service: Open Heart Surgery;  Laterality: N/A;  . Intraoperative transesophageal echocardiogram N/A 06/15/2014    Procedure: INTRAOPERATIVE TRANSESOPHAGEAL ECHOCARDIOGRAM;  Surgeon: Ivin Poot, MD;  Location: [place];  Service: Open Heart Surgery;  Laterality: N/A;  . Tonsillectomy  ~ 1950  . Cholecystectomy  ~ 2005  . Abdominal hysterectomy  1980's  . Cardiac catheterization    . Appendectomy  1970's  . Back surgery    . Lumbar disc surgery  1980's X 1; 1990's X  2000's X 1  . Anterior cervical decomp/discectomy fusion  2000's  . Chest tube insertion Left 08/07/2014    Procedure: INSERTION PLEURAL DRAINAGE CATHETER;  Surgeon: Ivin Poot, MD;  Location:  [place];  Service: Thoracic;  Laterality: Left;  . Chest tube insertion Right 08/12/2014    Procedure: INSERTION PLEURAL DRAINAGE CATHETER;  Surgeon: Ivin Poot, MD;  Location: [place];  Service: Thoracic;  Laterality: Right;  . Removal of pleural drainage catheter Bilateral 02/11/2015    Procedure: REMOVAL OF PLEURAL DRAINAGE CATHETER;  Surgeon: Ivin Poot, MD;  Location: [place];  Service: Thoracic;  Laterality: Bilateral;  . Talc pleurodesis Bilateral 02/11/2015    Procedure: Pietro Cassis;  Surgeon: Ivin Poot, MD;  Location: [facility] OR;  Service: Thoracic;  Laterality: Bilateral;    Family History  Problem Relation Age of Onset  . Stroke Father   . Diabetes Mother   . Hypertension Mother   . Alcoholism Brother   . Alcoholism Brother   . Heart disease Sister     Social History Social History  Substance Use Topics  . Smoking status: Never Smoker   . Smokeless tobacco: Never Used  . Alcohol Use: No    Current Outpatient Prescriptions  Medication Sig Dispense Refill  . ACCU-CHEK AVIVA PLUS test strip Inject 1 strip as directed 4 (four) times daily.  2  . amLODipine (NORVASC) 10 MG tablet Take 10 mg by mouth daily.   5  . aspirin EC 325 MG EC tablet Take 1 tablet (325 mg total) by mouth daily. 30 tablet 0  .  atorvastatin (LIPITOR) 40 MG tablet Take 1 tablet (40 mg total) by mouth daily at 6 PM.    . B Complex-C-Folic Acid (DIALYVITE PO) Take 1 tablet by mouth.    . Coconut Oil OIL by Does not apply route.    . ferrous Q000111Q C-folic acid (TRINSICON / FOLTRIN) capsule Take 1 capsule by mouth daily with breakfast. For one month then stop.    . furosemide (LASIX) 20 MG tablet Take 1 tablet (20 mg total) by mouth daily. (Patient taking differently: Take 20 mg by mouth every other day. ) 90 tablet 3  . GLUCERNA (GLUCERNA) LIQD Take 237 mLs by mouth daily.    . hydrALAZINE (APRESOLINE) 25 MG tablet Take 3 tablets (75 mg total) by mouth every 8 (eight) hours. (Patient  taking differently: Take 75 mg by mouth 2 (two) times daily. )    . HYDROcodone-acetaminophen (NORCO/VICODIN) 5-325 MG per tablet Take 1 tablet by mouth every 6 (six) hours as needed for moderate pain.    Marland Kitchen insulin glargine (LANTUS) 100 UNIT/ML injection Inject 10 Units into the skin at bedtime.    . insulin lispro (HUMALOG) 100 UNIT/ML KiwkPen Inject 0-12 Units into the skin 3 (three) times daily with meals.     . Insulin Syringe-Needle U-100 (INSULIN SYRINGE .5CC/30GX5/16") 30G X 5/16" 0.5 ML MISC Inject 0.5 mLs as directed daily.    . Insulin Syringe-Needle U-100 (INSULIN SYRINGE .5CC/30GX5/16") 30G X 5/16" 0.5 ML MISC Inject 0.5 mLs as directed 3 (three) times daily.  3  . lisinopril (PRINIVIL,ZESTRIL) 5 MG tablet Take 5 mg by mouth 2 (two) times daily.     . polyethylene glycol (MIRALAX / GLYCOLAX) packet Take 17 g by mouth daily as needed for mild constipation. 14 each 2  . primidone (MYSOLINE) 50 MG tablet Take 50 mg by mouth daily.   5  . sodium chloride (OCEAN) 0.65 % nasal spray Place into the nose.    . traMADol (ULTRAM) 50 MG tablet Take 1 tablet (50 mg total) by mouth every 6 (six) hours as needed for moderate pain. (Patient taking differently: Take 50 mg by mouth every 4 (four) hours as needed for moderate pain or severe pain. ) 30 tablet 0  . traZODone (DESYREL) 100 MG tablet Take 100 mg by mouth at bedtime.   0   No current facility-administered medications for this visit.    Allergies  Allergen Reactions  . Carvedilol Other (See Comments)    confusion  . Codeine Other (See Comments)    confusion  . Metoprolol Palpitations    Review of Systems   No edema She is walking mailbox everyday with a better exercise tolerance No angina She has received her influenza vaccine        Review of Systems :  [ y ] = yes, [  ] = no        General :  Weight gain [   ]    Weight loss  [   ]  Fatigue [  ]  Fever [  ]  Chills  [  ]                                Weakness  [  ]            Cardiac :  Chest pain/ pressure [  ]  Resting SOB [  ] exertional SOB [ Improving ]  Orthopnea [  ]  Pedal edema  [  ]  Palpitations [  ] Syncope/presyncope [ ]                         Paroxysmal nocturnal dyspnea [  ]        Pulmonary : cough [  ]  wheezing [  ]  Hemoptysis [  ] Sputum [  ] Snoring [  ]                              Pneumothorax [  ]  Sleep apnea [  ]       GI : Vomiting [  ]  Dysphagia [  ]  Melena  [  ]  Abdominal pain [  ] BRBPR [  ]              Heart burn [  ]  Constipation [  ] Diarrhea  [  ] Colonoscopy [  ]       GU : Hematuria [  ]  Dysuria [  ]  Nocturia [  ] UTI's [  ]       Vascular : Claudication [  ]  Rest pain [  ]  DVT [  ] Vein stripping [  ] leg ulcers [  ]                          TIA [  ] Stroke [  ]  Varicose veins [  ]       NEURO :  Headaches  [  ] Seizures [  ] Vision changes [  ] Paresthesias [  ]       Musculoskeletal :  Arthritis [  ] Gout  [  ]  Back pain [place]  ]  Joint pain [  [place]ara ]       Skin :  Rash [  ]  Melanoma [  ]        Heme : Bleeding problems [  ]Clotting Disorders [  ] Anemia [  ]Blood Transfusion [ ]        Endocrine : Diabetes [ yes ] Thyroid Disorder  [  ]       Psych : Depression [  ]  Anxiety [  ]  Psych hospitalizations [  ]                                               BP 145/74 mmHg  Pulse 77  Resp 19  Ht 5\' 5"  (1.651 m)  Wt 185 lb (83.915 kg)  BMI 30.79 kg/m2  SpO2 97% Physical Exam      Physical Exam  General: alert comfortable middle-aged Caucasian female HEENT: Normocephalic pupils equal , dentition adequate Neck: Supple without JVD, adenopathy, or bruit Chest: Clear to auscultation, symmetrical breath sounds, no rhonchi, no tenderness             or deformity Cardiovascular: Regular rate and rhythm, no murmur, no gallop, peripheral pulses             palpable in all extremities Abdomen:  Soft, nontender, no palpable mass or organomegaly Extremities: Warm, well-perfused, no  clubbing cyanosis edema or tenderness,  no venous stasis changes of the legs Rectal/GU: Deferred Neuro: Grossly non--focal and symmetrical throughout Skin: Clean and dry without rash or ulceration  Diagnostic Tests: Chest x-ray personally reviewed The shows mild interstitial edema We will leave her on Lasix 20 mg every other day-as her last creatinine was 1.7  Impression: Resolved bilateral pleural effusions following Pleurx catheter treatment  Plan: Return as needed. She'll continue her cardiology care through [facility] in [facility], MD [facility] [phone]

## 2015-06-09 ENCOUNTER — Encounter: Payer: Self-pay | Admitting: Cardiology

## 2015-06-09 ENCOUNTER — Ambulatory Visit (INDEPENDENT_AMBULATORY_CARE_PROVIDER_SITE_OTHER): Payer: Medicare Other | Admitting: Cardiology

## 2015-06-09 VITALS — BP 148/72 | HR 69 | Ht 65.0 in | Wt 193.0 lb

## 2015-06-09 DIAGNOSIS — I10 Essential (primary) hypertension: Secondary | ICD-10-CM

## 2015-06-09 DIAGNOSIS — E785 Hyperlipidemia, unspecified: Secondary | ICD-10-CM

## 2015-06-09 DIAGNOSIS — J90 Pleural effusion, not elsewhere classified: Secondary | ICD-10-CM

## 2015-06-09 DIAGNOSIS — N183 Chronic kidney disease, stage 3 unspecified: Secondary | ICD-10-CM

## 2015-06-09 DIAGNOSIS — I2510 Atherosclerotic heart disease of native coronary artery without angina pectoris: Secondary | ICD-10-CM

## 2015-06-09 DIAGNOSIS — I5032 Chronic diastolic (congestive) heart failure: Secondary | ICD-10-CM

## 2015-06-09 NOTE — Progress Notes (Signed)
Cardiology Office Note  Date: 06/09/2015   ID: Joyce [name], DOB 1941-04-24, MRN [id number]  PCP: Allie Dimmer, MD  Primary Cardiologist: Rozann Lesches, MD   Chief Complaint  Patient presents with  . Coronary Artery Disease    History of Present Illness: Joyce [name] is a 74 y.o. female former patient of Dr. Martinique, now establishing follow-up with me in the [place] office. She was last seen by Dr. Martinique in August. I reviewed extensive records and updated her chart.  She is here today with her daughter. Overall, she states that she has been feeling well. She did complete cardiac rehabilitation in [place] following CABG. She is not exercising regularly now, but does plan to join a gym with her daughter in January to be more consistent with her exercise. Her weight has been increasing, she attributes this to diet inconsistencies.  We reviewed her medications. From a cardiac perspective she is on aspirin, Lipitor, Norvasc, Lasix which she takes every other day, hydralazine, and lisinopril. Most recent lipid panel from October is reviewed below. She has had aggressively controlled lipids with low LDL.  She follows with Dr. Jenean Lindau for primary care, has seen her for nearly 30 years.  She does not report any angina symptoms and describes NYHA class II dyspnea, no orthopnea or PND. ECG today shows normal sinus rhythm.  Past Medical History  Diagnosis Date  . Essential hypertension   . CAD (coronary artery disease)     Multivessel status post CABG 05/2014 -  LIMA to LAD, SVG to diagonal, SVG to OM, SVG to PDA  . Hyperlipidemia   . Type II diabetes mellitus ([place])   . History of pneumonia   . Anemia   . Fibromyalgia   . Arthritis   . Chronic lower back pain   . Chronic kidney disease (CKD), stage III (moderate)   . Bilateral pleural effusion     Postoperative, status post Pleurx catheter and talc treatment - Dr. Prescott Gum    Past Surgical History  Procedure  Laterality Date  . Left heart catheterization with coronary angiogram N/A 06/11/2014    Procedure: LEFT HEART CATHETERIZATION WITH CORONARY ANGIOGRAM;  Surgeon: Blane Ohara, MD;  Location: [facility] CATH LAB;  Service: Cardiovascular;  Laterality: N/A;  . Coronary artery bypass graft N/A 06/15/2014    Procedure: CORONARY ARTERY BYPASS GRAFTING (CABG);  Surgeon: Ivin Poot, MD;  Location: [facility];  Service: Open Heart Surgery;  Laterality: N/A;  . Intraoperative transesophageal echocardiogram N/A 06/15/2014    Procedure: INTRAOPERATIVE TRANSESOPHAGEAL ECHOCARDIOGRAM;  Surgeon: Ivin Poot, MD;  Location: [facility];  Service: Open Heart Surgery;  Laterality: N/A;  . Tonsillectomy  ~ 1950  . Cholecystectomy  ~ 2005  . Abdominal hysterectomy  1980's  . Appendectomy  1970's  . Back surgery    . Lumbar disc surgery  1980's X 1; 1990's X  2000's X 1  . Anterior cervical decomp/discectomy fusion  2000's  . Chest tube insertion Left 08/07/2014    Procedure: INSERTION PLEURAL DRAINAGE CATHETER;  Surgeon: Ivin Poot, MD;  Location: [facility];  Service: Thoracic;  Laterality: Left;  . Chest tube insertion Right 08/12/2014    Procedure: INSERTION PLEURAL DRAINAGE CATHETER;  Surgeon: Ivin Poot, MD;  Location: [facility];  Service: Thoracic;  Laterality: Right;  . Removal of pleural drainage catheter Bilateral 02/11/2015    Procedure: REMOVAL OF PLEURAL DRAINAGE CATHETER;  Surgeon: Ivin Poot, MD;  Location: [facility];  Service:  Thoracic;  Laterality: Bilateral;  . Talc pleurodesis Bilateral 02/11/2015    Procedure: Pietro Cassis;  Surgeon: Ivin Poot, MD;  Location: [facility];  Service: Thoracic;  Laterality: Bilateral;    Current Outpatient Prescriptions  Medication Sig Dispense Refill  . ACCU-CHEK AVIVA PLUS test strip Inject 1 strip as directed 4 (four) times daily.  2  . amLODipine (NORVASC) 10 MG tablet Take 10 mg by mouth daily.   5  . aspirin EC 325 MG EC tablet Take 1 tablet (325 mg total)  by mouth daily. 30 tablet 0  . atorvastatin (LIPITOR) 40 MG tablet Take 1 tablet (40 mg total) by mouth daily at 6 PM.    . B Complex-C-Folic Acid (DIALYVITE PO) Take 1 tablet by mouth.    . Coconut Oil OIL by Does not apply route.    . ferrous [id number] C-folic acid (TRINSICON / FOLTRIN) capsule Take 1 capsule by mouth daily with breakfast. For one month then stop.    . furosemide (LASIX) 20 MG tablet Take 20 mg by mouth every other day.    Marland Kitchen GLUCERNA (GLUCERNA) LIQD Take 237 mLs by mouth daily.    . hydrALAZINE (APRESOLINE) 25 MG tablet Take 75 mg by mouth 3 (three) times daily.    Marland Kitchen HYDROcodone-acetaminophen (NORCO/VICODIN) 5-325 MG per tablet Take 1 tablet by mouth every 6 (six) hours as needed for moderate pain.    Marland Kitchen insulin glargine (LANTUS) 100 UNIT/ML injection Inject 10 Units into the skin at bedtime.    . insulin lispro (HUMALOG) 100 UNIT/ML KiwkPen Inject 0-12 Units into the skin 3 (three) times daily with meals.     . Insulin Syringe-Needle U-100 (INSULIN SYRINGE .5CC/30GX5/16") 30G X 5/16" 0.5 ML MISC Inject 0.5 mLs as directed daily.    . Insulin Syringe-Needle U-100 (INSULIN SYRINGE .5CC/30GX5/16") 30G X 5/16" 0.5 ML MISC Inject 0.5 mLs as directed 3 (three) times daily.  3  . lisinopril (PRINIVIL,ZESTRIL) 5 MG tablet Take 5 mg by mouth daily.     . polyethylene glycol (MIRALAX / GLYCOLAX) packet Take 17 g by mouth daily as needed for mild constipation. 14 each 2  . primidone (MYSOLINE) 50 MG tablet Take 50 mg by mouth daily.   5  . traMADol (ULTRAM) 50 MG tablet Take 1 tablet (50 mg total) by mouth every 6 (six) hours as needed for moderate pain. (Patient taking differently: Take 50 mg by mouth every 4 (four) hours as needed for moderate pain or severe pain. ) 30 tablet 0  . traZODone (DESYREL) 100 MG tablet Take 100 mg by mouth at bedtime.   0   No current facility-administered medications for this visit.   Allergies:  Carvedilol; Codeine; and Metoprolol   Social  History: The patient  reports that she has never smoked. She has never used smokeless tobacco. She reports that she does not drink alcohol or use illicit drugs.   ROS:  Please see the history of present illness. Otherwise, complete review of systems is positive for mild fatigue.  All other systems are reviewed and negative.   Physical Exam: VS:  BP 148/72 mmHg  Pulse 69  Ht 5\' 5"  (1.651 m)  Wt 193 lb (87.544 kg)  BMI 32.12 kg/m2  SpO2 96%, BMI Body mass index is 32.12 kg/(m^2).  Wt Readings from Last 3 Encounters:  06/09/15 193 lb (87.544 kg)  04/07/15 185 lb (83.915 kg)  03/11/15 184 lb (83.462 kg)    General: Overweight woman, appears comfortable at rest.  HEENT: Conjunctiva and lids normal, oropharynx clear. Neck: Supple, no elevated JVP or carotid bruits, no thyromegaly. Lungs: Clear to auscultation, diminished basilar breath sounds, nonlabored breathing at rest. Cardiac: Regular rate and rhythm, no S3, 2/6 systolic murmur, no pericardial rub. Abdomen: Soft, nontender, bowel sounds present, no guarding or rebound. Extremities: No pitting edema, distal pulses 2+. Skin: Warm and dry. Musculoskeletal: No kyphosis. Neuropsychiatric: Alert and oriented x3, affect grossly appropriate.  ECG: Tracing from 07/26/2014 showed normal sinus rhythm with nonspecific T-wave changes and decreased R wave progression.  Recent Labwork: 06/13/2014: TSH 3.700 07/28/2014: Magnesium 2.0 08/04/2014: B Natriuretic Peptide 159.8* 09/23/2014: Hemoglobin 11.3*; Platelets 168.0 10/23/2014: ALT 16; AST 19 12/23/2014: BUN 40*; Creatinine, Ser 1.78*; Potassium 4.6; Sodium 138     Component Value Date/Time   CHOL 105 10/23/2014 1149   TRIG 121.0 10/23/2014 1149   HDL 47.20 10/23/2014 1149   CHOLHDL 2 10/23/2014 1149   VLDL 24.2 10/23/2014 1149   LDLCALC 34 10/23/2014 1149  May 2016: Cholesterol 109, triglycerides 100, HDL 47, LDL 42 , potassium 4.3, BUN 28, creatinine 1.5 , AST 24, ALT 21  Other Studies  Reviewed Today:   Echocardiogram 07/27/2014: Study Conclusions  - Left ventricle: The cavity size was normal. Systolic function was normal. The estimated ejection fraction was in the range of 55% to 60%. Although no diagnostic regional wall motion abnormality was identified, this possibility cannot be completely excluded on the basis of this study. Doppler parameters are consistent with abnormal left ventricular relaxation (grade 1 diastolic dysfunction). - Ventricular septum: Septal motion showed paradox. - Atrial septum: No defect or patent foramen ovale was identified. - Pericardium, extracardiac: There was a left pleural effusion.  Chest x-ray 04/07/2015: FINDINGS: Prior CABG. Cardiomegaly. Mild basilar infiltrates and small pleural effusions consistent congestive heart failure. No pneumothorax. Prior cervical spine fusion  IMPRESSION: Prior CABG. Cardiomegaly with mild basilar pulmonary edema and small pleural effusions.  Assessment and Plan:  1. Multivessel CAD status post CABG in December 2015 as outlined above, preserved LVEF by echocardiogram. She is doing recently well at this time on medical therapy, did complete cardiac rehabilitation in [place]. I recommended getting back to a regular exercise plan and we discussed some options today. She states that she will be joining a gym with her daughter in January.  2. Aggressively controlled lipids as outlined above, tolerating Lipitor.  3. Essential hypertension, no changes made to current regimen. Systolic blood pressure in the 140s today.  4. History of postoperative bilateral pleural effusions status post Pleurx catheter drainage and ultimately talc treatments. She was managed by Dr. Prescott Gum. Significant improvement noted as of October.  5. CKD stage 3, creatinine 1.5 in October.  6. Acute on chronic anemia, hemoglobin 9.9 in October. Patient aware of this and is following with Dr. Jenean Lindau.  Current  medicines were reviewed with the patient today.   Orders Placed This Encounter  Procedures  . EKG 12-Lead    Disposition: FU with me in 6 months.   Signed, Satira Sark, MD, [facility] 06/09/2015 11:43 AM    [facility] [place], [place], [place] [address] Phone: [phone]; Fax: [phone]

## 2015-06-09 NOTE — Patient Instructions (Signed)
Continue all current medications. Your physician wants you to follow up in: 6 months.  You will receive a reminder letter in the mail one-two months in advance.  If you don't receive a letter, please call our office to schedule the follow up appointment   

## 2015-08-24 ENCOUNTER — Ambulatory Visit (INDEPENDENT_AMBULATORY_CARE_PROVIDER_SITE_OTHER): Payer: Medicare Other | Admitting: Cardiology

## 2015-08-24 ENCOUNTER — Ambulatory Visit (HOSPITAL_COMMUNITY)
Admission: RE | Admit: 2015-08-24 | Discharge: 2015-08-24 | Disposition: A | Discharge status: Home / Self Care | Payer: Medicare Other | Source: Ambulatory Visit | Attending: Cardiology | Admitting: Cardiology

## 2015-08-24 ENCOUNTER — Encounter: Payer: Self-pay | Admitting: Cardiology

## 2015-08-24 VITALS — BP 184/78 | HR 95 | Ht 64.0 in | Wt 196.0 lb

## 2015-08-24 DIAGNOSIS — N183 Chronic kidney disease, stage 3 unspecified: Secondary | ICD-10-CM

## 2015-08-24 DIAGNOSIS — R0602 Shortness of breath: Secondary | ICD-10-CM

## 2015-08-24 DIAGNOSIS — I2510 Atherosclerotic heart disease of native coronary artery without angina pectoris: Secondary | ICD-10-CM

## 2015-08-24 DIAGNOSIS — J90 Pleural effusion, not elsewhere classified: Secondary | ICD-10-CM | POA: Insufficient documentation

## 2015-08-24 DIAGNOSIS — J449 Chronic obstructive pulmonary disease, unspecified: Secondary | ICD-10-CM | POA: Insufficient documentation

## 2015-08-24 DIAGNOSIS — I10 Essential (primary) hypertension: Secondary | ICD-10-CM

## 2015-08-24 MED ORDER — FUROSEMIDE 20 MG PO TABS: 20.0000 mg | ORAL_TABLET | Freq: Every day | ORAL | Status: DC

## 2015-08-24 NOTE — Patient Instructions (Signed)
Your physician recommends that you schedule a follow-up appointment with Dr. Domenic Polite  Your physician recommends that you continue on your current medications as directed. Please refer to the Current Medication list given to you today.  Your physician has recommended you make the following change in your medication:   Lasix 20 mg Daily   Your physician has requested that you have an echocardiogram. Echocardiography is a painless test that uses sound waves to create images of your heart. It provides your doctor with information about the size and shape of your heart and how well your heart's chambers and valves are working. This procedure takes approximately one hour. There are no restrictions for this procedure.  Please have Chest X-Ray done today.  Thank you for choosing [facility]!

## 2015-08-24 NOTE — Progress Notes (Signed)
Cardiology Office Note  Date: 08/24/2015   ID: Joyce [name], DOB 1940-07-29, MRN [id number]  PCP: Allie Dimmer, MD  Primary Cardiologist: Rozann Lesches, MD   Chief Complaint  Patient presents with  . Shortness of Breath  . Coronary Artery Disease    History of Present Illness: Joyce [name] is a 75 y.o. female that I met for the first time in December 2016. She called to arrange a follow-up visit sooner than anticipated due to a feeling of worsening shortness of breath over the last month. She states that she has had no significant changes in her medical therapy other than an increase in hydralazine for better blood pressure control. She does not report any angina symptoms, no palpitations. Her weight has fluctuated by only a few pounds, she continues to take Lasix 20 mg every other day. By our scales her weight is up about 3 pounds.  She continues to follow with her nephrologist and also with Dr. Jenean Lindau for primary care. She states that she continues on iron supplements and had a recent stable hemoglobin level. She also reports having a pending evaluation with a pulmonologist for her shortness of breath. In addition she describes a pleuritic discomfort when she takes a deep breath, usually in her lower thorax. History includes bilateral pleural effusions status post prior Pleurx catheter and talc treatment by Dr. Prescott Gum after she underwent CABG.  I reviewed her last chest x-ray from October 2016 which is reported below. She had small bilateral pleural effusions at that time. Echocardiography from 1 year ago showed LVEF 55-60% with mild diastolic dysfunction.  I reviewed her medications. Current cardiac regimen includes aspirin, Norvasc, Lipitor, hydralazine, Lasix every other day, and lisinopril.  Past Medical History  Diagnosis Date  . Essential hypertension   . CAD (coronary artery disease)     Multivessel status post CABG 05/2014 -  LIMA to LAD, SVG to diagonal, SVG  to OM, SVG to PDA  . Hyperlipidemia   . Type II diabetes mellitus ([place])   . History of pneumonia   . Anemia   . Fibromyalgia   . Arthritis   . Chronic lower back pain   . Chronic kidney disease (CKD), stage III (moderate)   . Bilateral pleural effusion     Postoperative, status post Pleurx catheter and talc treatment - Dr. Prescott Gum    Past Surgical History  Procedure Laterality Date  . Left heart catheterization with coronary angiogram N/A 06/11/2014    Procedure: LEFT HEART CATHETERIZATION WITH CORONARY ANGIOGRAM;  Surgeon: Blane Ohara, MD;  Location: [facility] CATH LAB;  Service: Cardiovascular;  Laterality: N/A;  . Coronary artery bypass graft N/A 06/15/2014    Procedure: CORONARY ARTERY BYPASS GRAFTING (CABG);  Surgeon: Ivin Poot, MD;  Location: [facility];  Service: Open Heart Surgery;  Laterality: N/A;  . Intraoperative transesophageal echocardiogram N/A 06/15/2014    Procedure: INTRAOPERATIVE TRANSESOPHAGEAL ECHOCARDIOGRAM;  Surgeon: Ivin Poot, MD;  Location: [facility];  Service: Open Heart Surgery;  Laterality: N/A;  . Tonsillectomy  ~ 1950  . Cholecystectomy  ~ 2005  . Abdominal hysterectomy  1980's  . Appendectomy  1970's  . Back surgery    . Lumbar disc surgery  1980's X 1; 1990's X  2000's X 1  . Anterior cervical decomp/discectomy fusion  2000's  . Chest tube insertion Left 08/07/2014    Procedure: INSERTION PLEURAL DRAINAGE CATHETER;  Surgeon: Ivin Poot, MD;  Location: [facility];  Service: Thoracic;  Laterality: Left;  . Chest tube insertion Right 08/12/2014    Procedure: INSERTION PLEURAL DRAINAGE CATHETER;  Surgeon: Ivin Poot, MD;  Location: [facility];  Service: Thoracic;  Laterality: Right;  . Removal of pleural drainage catheter Bilateral 02/11/2015    Procedure: REMOVAL OF PLEURAL DRAINAGE CATHETER;  Surgeon: Ivin Poot, MD;  Location: [facility];  Service: Thoracic;  Laterality: Bilateral;  . Talc pleurodesis Bilateral 02/11/2015    Procedure: Pietro Cassis;  Surgeon: Ivin Poot, MD;  Location: [facility];  Service: Thoracic;  Laterality: Bilateral;    Current Outpatient Prescriptions  Medication Sig Dispense Refill  . ACCU-CHEK AVIVA PLUS test strip Inject 1 strip as directed 4 (four) times daily.  2  . amLODipine (NORVASC) 10 MG tablet Take 10 mg by mouth daily.   5  . aspirin EC 325 MG EC tablet Take 1 tablet (325 mg total) by mouth daily. 30 tablet 0  . atorvastatin (LIPITOR) 40 MG tablet Take 1 tablet (40 mg total) by mouth daily at 6 PM.    . B Complex-C-Folic Acid (DIALYVITE PO) Take 1 tablet by mouth.    . Coconut Oil OIL by Does not apply route.    . ferrous [id number] C-folic acid (TRINSICON / FOLTRIN) capsule Take 1 capsule by mouth daily with breakfast. For one month then stop.    Marland Kitchen GLUCERNA (GLUCERNA) LIQD Take 237 mLs by mouth daily.    . hydrALAZINE (APRESOLINE) 25 MG tablet Take 100 mg by mouth 2 (two) times daily.     Marland Kitchen HYDROcodone-acetaminophen (NORCO/VICODIN) 5-325 MG per tablet Take 1 tablet by mouth every 6 (six) hours as needed for moderate pain.    Marland Kitchen insulin glargine (LANTUS) 100 UNIT/ML injection Inject 10 Units into the skin at bedtime.    . insulin lispro (HUMALOG) 100 UNIT/ML KiwkPen Inject 0-12 Units into the skin 3 (three) times daily with meals.     . Insulin Syringe-Needle U-100 (INSULIN SYRINGE .5CC/30GX5/16") 30G X 5/16" 0.5 ML MISC Inject 0.5 mLs as directed daily.    . Insulin Syringe-Needle U-100 (INSULIN SYRINGE .5CC/30GX5/16") 30G X 5/16" 0.5 ML MISC Inject 0.5 mLs as directed 3 (three) times daily.  3  . lisinopril (PRINIVIL,ZESTRIL) 5 MG tablet Take 5 mg by mouth daily.     . polyethylene glycol (MIRALAX / GLYCOLAX) packet Take 17 g by mouth daily as needed for mild constipation. 14 each 2  . primidone (MYSOLINE) 50 MG tablet Take 50 mg by mouth daily.   5  . traMADol (ULTRAM) 50 MG tablet Take 1 tablet (50 mg total) by mouth every 6 (six) hours as needed for moderate pain. (Patient  taking differently: Take 50 mg by mouth every 4 (four) hours as needed for moderate pain or severe pain. ) 30 tablet 0  . traZODone (DESYREL) 100 MG tablet Take 100 mg by mouth at bedtime.   0  . furosemide (LASIX) 20 MG tablet Take 1 tablet (20 mg total) by mouth daily. 90 tablet 3   No current facility-administered medications for this visit.   Allergies:  Carvedilol; Codeine; and Metoprolol   Social History: The patient  reports that she has never smoked. She has never used smokeless tobacco. She reports that she does not drink alcohol or use illicit drugs.   ROS:  Please see the history of present illness. Otherwise, complete review of systems is positive for fatigue. No cough, fevers or chills. All other systems are reviewed and negative.   Physical Exam:  VS:  BP 184/78 mmHg  Pulse 95  Ht '5\' 4"'$  (1.626 m)  Wt 196 lb (88.905 kg)  BMI 33.63 kg/m2  SpO2 97%, BMI Body mass index is 33.63 kg/(m^2).  Wt Readings from Last 3 Encounters:  08/24/15 196 lb (88.905 kg)  06/09/15 193 lb (87.544 kg)  04/07/15 185 lb (83.915 kg)    General: Overweight woman, appears comfortable at rest. HEENT: Conjunctiva and lids normal, oropharynx clear. Neck: Supple, no elevated JVP or carotid bruits, no thyromegaly. Lungs: Diminished mid to basilar breath sounds, nonlabored breathing at rest. Cardiac: Regular rate and rhythm, no S3, 2/6 systolic murmur, no pericardial rub. Abdomen: Soft, nontender, bowel sounds present, no guarding or rebound. Extremities: No pitting edema, distal pulses 2+. Skin: Warm and dry. Musculoskeletal: No kyphosis. Neuropsychiatric: Alert and oriented x3, affect grossly appropriate.  ECG: I personally reviewed the prior tracing from December 2016 which showed normal sinus rhythm.  Recent Labwork:  October 2016: Hemoglobin 9.9, platelets 180, potassium 5.2, BUN 33, creatinine 1.5, AST 16, ALT 12, cholesterol 98, triglycerides 57, HDL 48, LDL 38  Other Studies Reviewed  Today:  Echocardiogram 07/27/2014: Study Conclusions  - Left ventricle: The cavity size was normal. Systolic function was normal. The estimated ejection fraction was in the range of 55% to 60%. Although no diagnostic regional wall motion abnormality was identified, this possibility cannot be completely excluded on the basis of this study. Doppler parameters are consistent with abnormal left ventricular relaxation (grade 1 diastolic dysfunction). - Ventricular septum: Septal motion showed paradox. - Atrial septum: No defect or patent foramen ovale was identified. - Pericardium, extracardiac: There was a left pleural effusion.  Chest x-ray 04/07/2015: FINDINGS: Prior CABG. Cardiomegaly. Mild basilar infiltrates and small pleural effusions consistent congestive heart failure. No pneumothorax. Prior cervical spine fusion  IMPRESSION: Prior CABG. Cardiomegaly with mild basilar pulmonary edema and small pleural effusions.  Assessment and Plan:  1. Worsening shortness of breath over the last month. History includes postoperative pleural effusions after CABG requiring Pleurx catheter and talc treatment. Her last chest x-ray in October 2016 showed small pleural effusions. She has been taking Lasix every other day, for now this will be increased to daily dosing at 20 mg. We will obtain a follow-up PA and lateral chest x-ray and also echocardiogram to reassess cardiac structure and function.  2. Multivessel CAD status post CABG in December 2015. No reported angina symptoms on medical therapy.  3. Essential hypertension, blood pressure trend has been up. Hydralazine dose has been adjusted since last visit. We are also advancing her diuretic.  4. CKD stage III, last creatinine 1.5. She continues to follow with nephrology.  Current medicines were reviewed with the patient today.   Orders Placed This Encounter  Procedures  . DG Chest 2 View  . Echocardiogram    Disposition:  Call with results. Keep next scheduled visit for now.   Signed, Satira Sark, MD, [facility] 08/24/2015 1:56 PM    [facility] [address], [place], [place] [address] Phone: [phone]; Fax: [phone]

## 2015-08-25 ENCOUNTER — Ambulatory Visit (HOSPITAL_COMMUNITY)
Admission: RE | Admit: 2015-08-25 | Discharge: 2015-08-25 | Disposition: A | Discharge status: Home / Self Care | Payer: Medicare Other | Source: Ambulatory Visit | Attending: Cardiology | Admitting: Cardiology

## 2015-08-25 DIAGNOSIS — R0602 Shortness of breath: Secondary | ICD-10-CM

## 2015-08-25 DIAGNOSIS — I1310 Hypertensive heart and chronic kidney disease without heart failure, with stage 1 through stage 4 chronic kidney disease, or unspecified chronic kidney disease: Secondary | ICD-10-CM | POA: Insufficient documentation

## 2015-08-25 DIAGNOSIS — J90 Pleural effusion, not elsewhere classified: Secondary | ICD-10-CM

## 2015-08-25 DIAGNOSIS — I371 Nonrheumatic pulmonary valve insufficiency: Secondary | ICD-10-CM | POA: Diagnosis not present

## 2015-08-25 DIAGNOSIS — I358 Other nonrheumatic aortic valve disorders: Secondary | ICD-10-CM | POA: Diagnosis not present

## 2015-08-25 DIAGNOSIS — I340 Nonrheumatic mitral (valve) insufficiency: Secondary | ICD-10-CM | POA: Insufficient documentation

## 2015-08-25 DIAGNOSIS — I071 Rheumatic tricuspid insufficiency: Secondary | ICD-10-CM | POA: Diagnosis not present

## 2015-08-25 DIAGNOSIS — Z951 Presence of aortocoronary bypass graft: Secondary | ICD-10-CM | POA: Insufficient documentation

## 2015-08-25 DIAGNOSIS — N183 Chronic kidney disease, stage 3 (moderate): Secondary | ICD-10-CM | POA: Diagnosis not present

## 2015-08-25 DIAGNOSIS — E1122 Type 2 diabetes mellitus with diabetic chronic kidney disease: Secondary | ICD-10-CM | POA: Diagnosis not present

## 2015-11-07 ENCOUNTER — Other Ambulatory Visit: Payer: Self-pay | Admitting: Nurse Practitioner

## 2015-11-17 DIAGNOSIS — E669 Obesity, unspecified: Secondary | ICD-10-CM | POA: Insufficient documentation

## 2015-12-08 ENCOUNTER — Ambulatory Visit (INDEPENDENT_AMBULATORY_CARE_PROVIDER_SITE_OTHER): Payer: Medicare Other | Admitting: Cardiology

## 2015-12-08 ENCOUNTER — Encounter: Payer: Self-pay | Admitting: Cardiology

## 2015-12-08 VITALS — BP 150/64 | HR 69 | Ht 65.0 in | Wt 199.0 lb

## 2015-12-08 DIAGNOSIS — I5032 Chronic diastolic (congestive) heart failure: Secondary | ICD-10-CM

## 2015-12-08 DIAGNOSIS — I2510 Atherosclerotic heart disease of native coronary artery without angina pectoris: Secondary | ICD-10-CM | POA: Diagnosis not present

## 2015-12-08 DIAGNOSIS — I10 Essential (primary) hypertension: Secondary | ICD-10-CM | POA: Diagnosis not present

## 2015-12-08 NOTE — Patient Instructions (Signed)
Your physician recommends that you continue on your current medications as directed. Please refer to the Current Medication list given to you today. Your physician recommends that you schedule a follow-up appointment in: 6 months. You will receive a reminder letter in the mail in about 4 months reminding you to call and schedule your appointment. If you don't receive this letter, please contact our office. 

## 2015-12-08 NOTE — Progress Notes (Signed)
Cardiology Office Note  Date: 12/08/2015   ID: Joyce [name], DOB June 05, 1941, MRN [id number]  PCP: Allie Dimmer, MD  Primary Cardiologist: Rozann Lesches, MD   Chief Complaint  Patient presents with  . Coronary Artery Disease    History of Present Illness: Joyce [name] is a 75 y.o. female last seen in February. She presents for a routine follow-up visit. She does not report any angina symptoms or nitroglycerin use. She has had functional limitations related to chronic back pain and it sounds like she is due to have an orthopedic consultation soon regarding lumbar disc disease.  She underwent a follow-up echocardiogram and chest x-ray earlier this year, results outlined below. She has been on daily Lasix for management of diastolic dysfunction. She did not have any significant reaccumulation of pleural fluid, stable small bilateral pleural effusions.  We went over her home blood pressure checks. Hydralazine has been increased to 3 times a day from twice a day since last encounter.  Past Medical History  Diagnosis Date  . Essential hypertension   . CAD (coronary artery disease)     Multivessel status post CABG 05/2014 -  LIMA to LAD, SVG to diagonal, SVG to OM, SVG to PDA  . Hyperlipidemia   . Type II diabetes mellitus ([place])   . History of pneumonia   . Anemia   . Fibromyalgia   . Arthritis   . Chronic lower back pain   . Chronic kidney disease (CKD), stage III (moderate)   . Bilateral pleural effusion     Postoperative, status post Pleurx catheter and talc treatment - Dr. Prescott Gum    Past Surgical History  Procedure Laterality Date  . Left heart catheterization with coronary angiogram N/A 06/11/2014    Procedure: LEFT HEART CATHETERIZATION WITH CORONARY ANGIOGRAM;  Surgeon: Blane Ohara, MD;  Location: [facility] CATH LAB;  Service: Cardiovascular;  Laterality: N/A;  . Coronary artery bypass graft N/A 06/15/2014    Procedure: CORONARY ARTERY BYPASS GRAFTING (CABG);   Surgeon: Ivin Poot, MD;  Location: [place];  Service: Open Heart Surgery;  Laterality: N/A;  . Intraoperative transesophageal echocardiogram N/A 06/15/2014    Procedure: INTRAOPERATIVE TRANSESOPHAGEAL ECHOCARDIOGRAM;  Surgeon: Ivin Poot, MD;  Location: [place];  Service: Open Heart Surgery;  Laterality: N/A;  . Tonsillectomy  ~ 1950  . Cholecystectomy  ~ 2005  . Abdominal hysterectomy  1980's  . Appendectomy  1970's  . Back surgery    . Lumbar disc surgery  1980's X 1; 1990's X  2000's X 1  . Anterior cervical decomp/discectomy fusion  2000's  . Chest tube insertion Left 08/07/2014    Procedure: INSERTION PLEURAL DRAINAGE CATHETER;  Surgeon: Ivin Poot, MD;  Location: [place];  Service: Thoracic;  Laterality: Left;  . Chest tube insertion Right 08/12/2014    Procedure: INSERTION PLEURAL DRAINAGE CATHETER;  Surgeon: Ivin Poot, MD;  Location: [place];  Service: Thoracic;  Laterality: Right;  . Removal of pleural drainage catheter Bilateral 02/11/2015    Procedure: REMOVAL OF PLEURAL DRAINAGE CATHETER;  Surgeon: Ivin Poot, MD;  Location: [place];  Service: Thoracic;  Laterality: Bilateral;  . Talc pleurodesis Bilateral 02/11/2015    Procedure: Pietro Cassis;  Surgeon: Ivin Poot, MD;  Location: [place];  Service: Thoracic;  Laterality: Bilateral;    Current Outpatient Prescriptions  Medication Sig Dispense Refill  . ACCU-CHEK AVIVA PLUS test strip Inject 1 strip as directed 4 (four) times daily.  2  .  amLODipine (NORVASC) 10 MG tablet Take 10 mg by mouth daily.   5  . aspirin EC 325 MG EC tablet Take 1 tablet (325 mg total) by mouth daily. 30 tablet 0  . atorvastatin (LIPITOR) 40 MG tablet Take 1 tablet (40 mg total) by mouth daily at 6 PM.    . B Complex-C-Folic Acid (DIALYVITE PO) Take 1 tablet by mouth.    . Coconut Oil OIL by Does not apply route.    . ferrous [id number] C-folic acid (TRINSICON / FOLTRIN) capsule Take 1 capsule by mouth daily with  breakfast. For one month then stop.    . furosemide (LASIX) 20 MG tablet Take 1 tablet (20 mg total) by mouth daily. 90 tablet 3  . GLUCERNA (GLUCERNA) LIQD Take 237 mLs by mouth daily.    . hydrALAZINE (APRESOLINE) 100 MG tablet Take 100 mg by mouth 3 (three) times daily.    Marland Kitchen HYDROcodone-acetaminophen (NORCO/VICODIN) 5-325 MG per tablet Take 1 tablet by mouth every 6 (six) hours as needed for moderate pain.    Marland Kitchen insulin glargine (LANTUS) 100 UNIT/ML injection Inject 10 Units into the skin at bedtime.    . insulin lispro (HUMALOG) 100 UNIT/ML KiwkPen Inject 0-12 Units into the skin 3 (three) times daily with meals.     . Insulin Syringe-Needle U-100 (INSULIN SYRINGE .5CC/30GX5/16") 30G X 5/16" 0.5 ML MISC Inject 0.5 mLs as directed daily.    . Insulin Syringe-Needle U-100 (INSULIN SYRINGE .5CC/30GX5/16") 30G X 5/16" 0.5 ML MISC Inject 0.5 mLs as directed 3 (three) times daily.  3  . lisinopril (PRINIVIL,ZESTRIL) 5 MG tablet Take 5 mg by mouth daily.     . polyethylene glycol (MIRALAX / GLYCOLAX) packet Take 17 g by mouth daily as needed for mild constipation. 14 each 2  . primidone (MYSOLINE) 50 MG tablet Take 50 mg by mouth daily.   5  . traMADol (ULTRAM) 50 MG tablet Take 1 tablet (50 mg total) by mouth every 6 (six) hours as needed for moderate pain. (Patient taking differently: Take 50 mg by mouth every 4 (four) hours as needed for moderate pain or severe pain. ) 30 tablet 0  . traZODone (DESYREL) 100 MG tablet Take 100 mg by mouth at bedtime.   0   No current facility-administered medications for this visit.   Allergies:  Carvedilol; Codeine; and Metoprolol   Social History: The patient  reports that she has never smoked. She has never used smokeless tobacco. She reports that she does not drink alcohol or use illicit drugs.   ROS:  Please see the history of present illness. Otherwise, complete review of systems is positive for chronic back pain.  All other systems are reviewed and  negative.   Physical Exam: VS:  BP 150/64 mmHg  Pulse 69  Ht 5\' 5"  (1.651 m)  Wt 199 lb (90.266 kg)  BMI 33.12 kg/m2  SpO2 95%, BMI Body mass index is 33.12 kg/(m^2).  Wt Readings from Last 3 Encounters:  12/08/15 199 lb (90.266 kg)  08/24/15 196 lb (88.905 kg)  06/09/15 193 lb (87.544 kg)    General: Overweight woman, appears comfortable at rest. HEENT: Conjunctiva and lids normal, oropharynx clear. Neck: Supple, no elevated JVP or carotid bruits, no thyromegaly. Lungs: Diminished mid to basilar breath sounds, nonlabored breathing at rest. Cardiac: Regular rate and rhythm, no S3, 2/6 systolic murmur, no pericardial rub. Abdomen: Soft, nontender, bowel sounds present, no guarding or rebound. Extremities: No pitting edema, distal pulses 2+.  ECG:  I personally reviewed the prior tracing from 06/09/2015 which showed normal sinus rhythm.  Recent Labwork: 12/23/2014: BUN 40*; Creatinine, Ser 1.78*; Potassium 4.6; Sodium 138     Component Value Date/Time   CHOL 105 10/23/2014 1149   TRIG 121.0 10/23/2014 1149   HDL 47.20 10/23/2014 1149   CHOLHDL 2 10/23/2014 1149   VLDL 24.2 10/23/2014 1149   LDLCALC 34 10/23/2014 1149    Other Studies Reviewed Today:  Echocardiogram 08/25/2015: Study Conclusions  - Left ventricle: The cavity size was normal. Wall thickness was  increased in a pattern of mild LVH. Systolic function was  vigorous. The estimated ejection fraction was in the range of 65%  to 70%. Wall motion was normal; there were no regional wall  motion abnormalities. Doppler parameters are consistent with  abnormal left ventricular relaxation (grade 1 diastolic  dysfunction). Doppler parameters are consistent with high  ventricular filling pressure. - Aortic valve: Mildly calcified annulus. Trileaflet; mildly  calcified leaflets. Cusp separation was normal. Mean gradient  (S): 9 mm Hg. Peak gradient (S): 18 mm Hg. Valve area (VTI): 1.74  cm^2. - Mitral valve:  There was trivial regurgitation. - Left atrium: The atrium was mildly dilated. - Right atrium: Central venous pressure (est): 3 mm Hg. - Tricuspid valve: There was trivial regurgitation. - Pulmonic valve: There was mild regurgitation. - Pulmonary arteries: Systolic pressure could not be accurately  estimated. - Pericardium, extracardiac: There was no pericardial effusion.  Impressions:  - Mild LVH with LVEF 65-70%. Grade 1 diastolic dysfunction with  increased LV filling pressure. Mild left atrial enlargement.  Trivial mitral regurgitation. Sclerotic aortic valve without  stenosis. Mild pulmonic regurgitation.  Chest x-ray 08/24/2015: FINDINGS: There is hyperinflation of the lungs compatible with COPD. Heart is borderline in size. Small bilateral pleural effusions. No confluent airspace opacities. Prior CABG.  IMPRESSION: COPD. Small bilateral pleural effusions.  Assessment and Plan:  1. Multivessel CAD status post CABG in December 2015. No active angina symptoms. Continue medical therapy and observation.  2. Mild diastolic dysfunction with LVEF 65-70%. She continues on daily Lasix.  3. Essential hypertension, on hydralazine, Norvasc, and lisinopril.  Current medicines were reviewed with the patient today.  Disposition: FU with me in 6 months.   Signed, Satira Sark, MD, [facility] 12/08/2015 10:21 AM    [facility] [place], [place], [place] [address] Phone: [phone]; Fax: [phone]

## 2015-12-27 ENCOUNTER — Other Ambulatory Visit: Payer: Self-pay | Admitting: Orthopedic Surgery

## 2015-12-27 DIAGNOSIS — M48061 Spinal stenosis, lumbar region without neurogenic claudication: Secondary | ICD-10-CM

## 2016-01-02 ENCOUNTER — Ambulatory Visit
Admission: RE | Admit: 2016-01-02 | Discharge: 2016-01-02 | Disposition: A | Discharge status: Home / Self Care | Payer: Medicare Other | Source: Ambulatory Visit | Attending: Orthopedic Surgery | Admitting: Orthopedic Surgery

## 2016-01-02 DIAGNOSIS — M48061 Spinal stenosis, lumbar region without neurogenic claudication: Secondary | ICD-10-CM

## 2016-01-25 ENCOUNTER — Telehealth: Payer: Self-pay | Admitting: Cardiology

## 2016-01-25 MED ORDER — ASPIRIN EC 81 MG PO TBEC: 81.0000 mg | DELAYED_RELEASE_TABLET | Freq: Every day | ORAL | Status: DC

## 2016-01-25 NOTE — Telephone Encounter (Signed)
Just on left forearm, about half way up the arm.  Noticed Saturday.  Stated that she has had on both arms, but no place else.  Never had before now.  No soreness, warm to touch, or any bruising to that area.  Is sure that she did not hit arm on anything.

## 2016-01-25 NOTE — Telephone Encounter (Signed)
That would be an unusual place for her to see an isolated reaction to aspirin. Having said that, she could certainly cut her aspirin dose back from a cardiac perspective, 81 mg daily would be reasonable. If symptoms persist, she should check with her PCP to make sure that there is not something else going on.

## 2016-01-25 NOTE — Telephone Encounter (Signed)
Joyce [name] called stating that she thinks the ASA 325 mg is causing blood vessels to break. She is wanting to know if she can cut the dosage in half.  Patient is requested to be called after 1pm.

## 2016-01-25 NOTE — Telephone Encounter (Signed)
Patient notified and verbalized understanding. 

## 2016-07-01 IMAGING — CT CT ANGIO CHEST
2 of 9 series · 18 of 46 positions shown · IV contrast (Omni 300)
Comparison: Chest radiograph of same day.

CLINICAL DATA: Chest pain, shortness of breath.

EXAM:
CT ANGIOGRAPHY CHEST WITH CONTRAST
TECHNIQUE: Multidetector CT imaging of the chest was performed using the
standard protocol during bolus administration of intravenous
contrast. Multiplanar CT image reconstructions and MIPs were
obtained to evaluate the vascular anatomy.
CONTRAST:  80mL OMNIPAQUE IOHEXOL 350 MG/ML SOLN

[Series 5: thins · axial · 0.67mm/px · z∈[+1152,+1363]mm · 15 of 239 slices shown]
[im 14/239  lung]
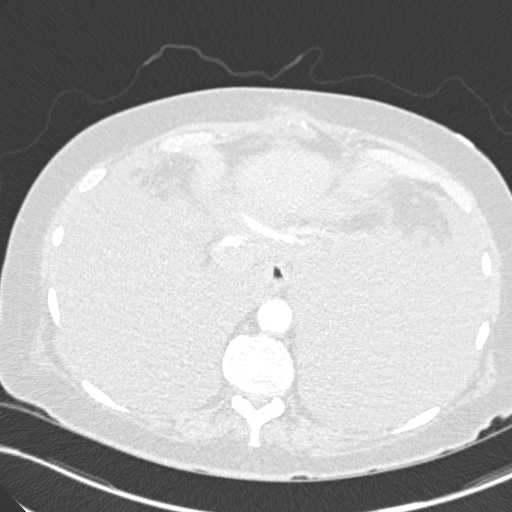
[im 27/239  soft-tissue]
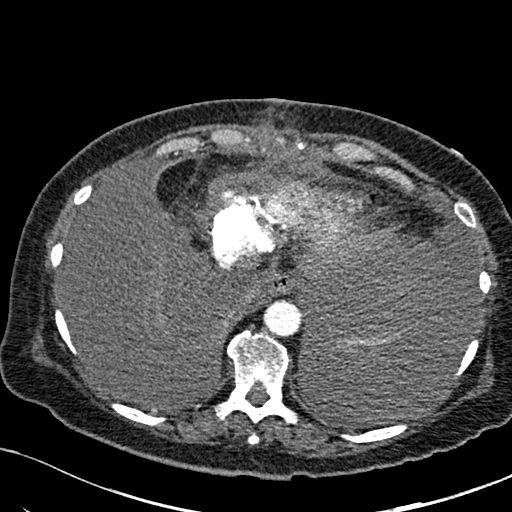
[im 40/239  lung]
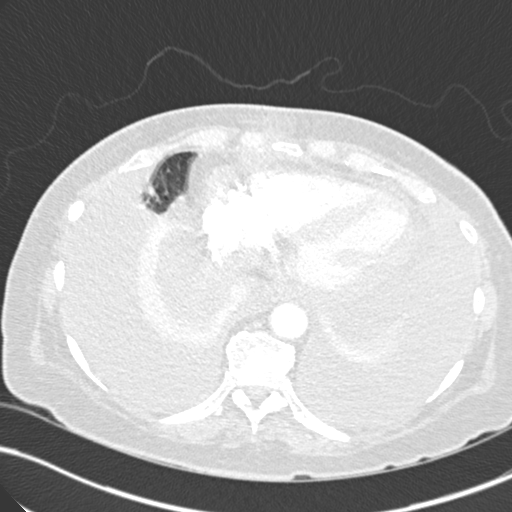
[im 53/239  soft-tissue]
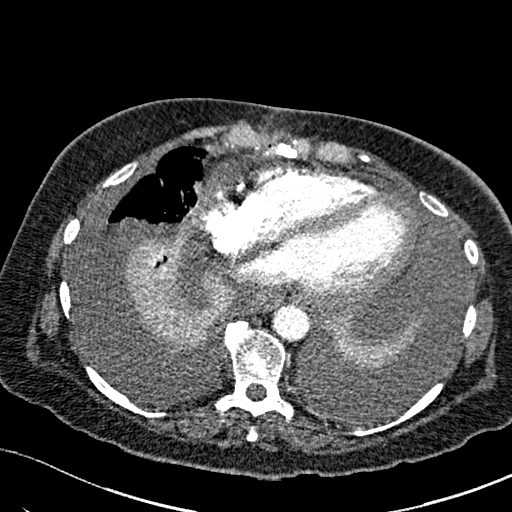
[im 80/239  lung]
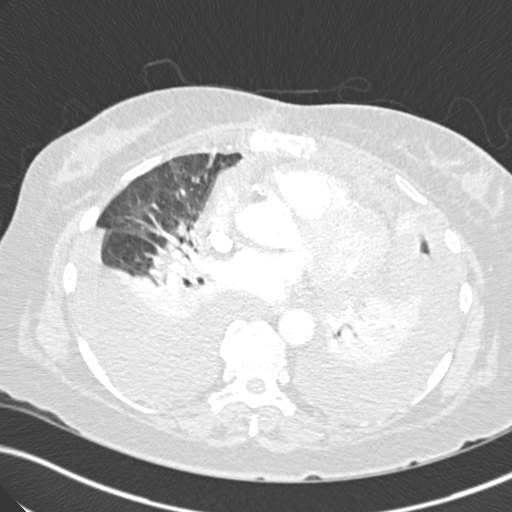
[im 93/239  soft-tissue]
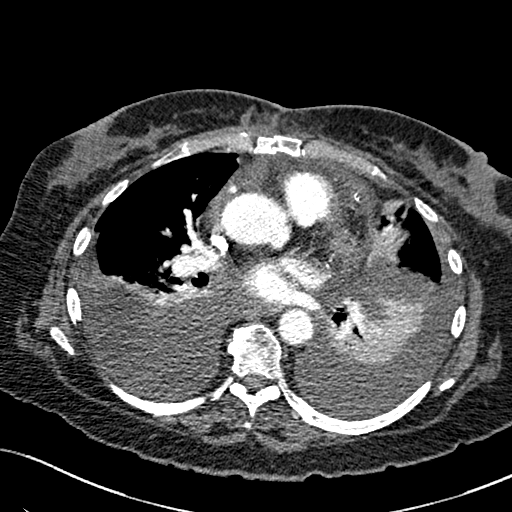
[im 106/239  lung]
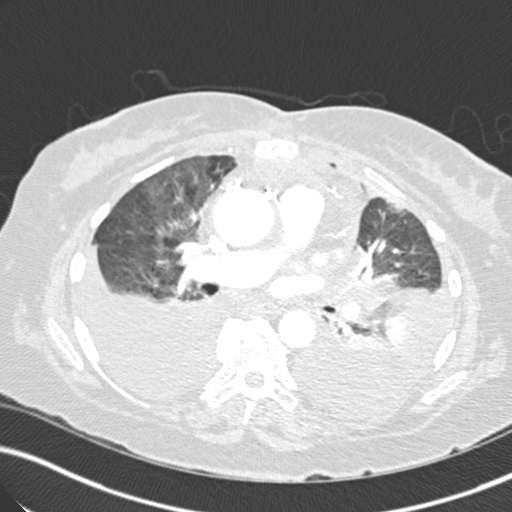
[im 120/239  soft-tissue]
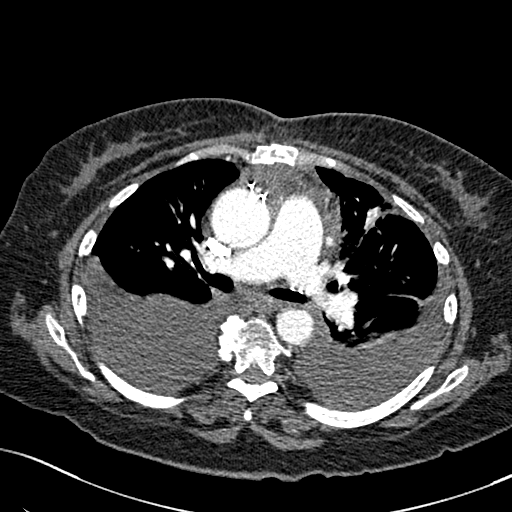
[im 133/239  lung]
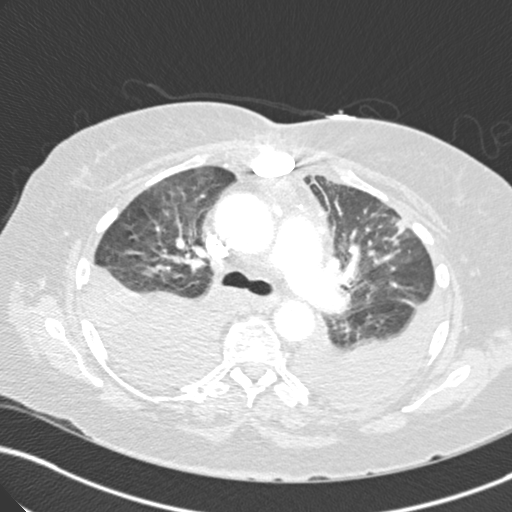
[im 146/239  soft-tissue]
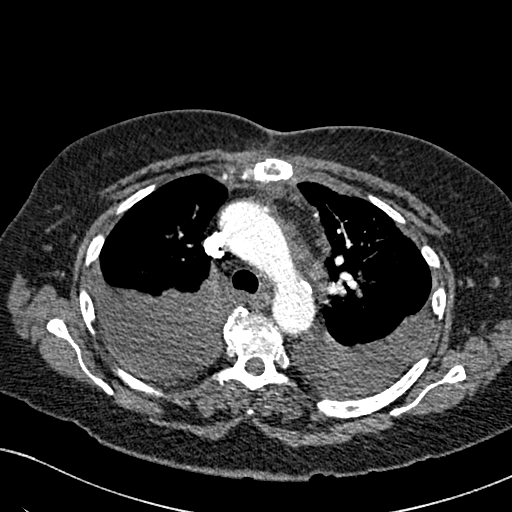
[im 159/239  lung]
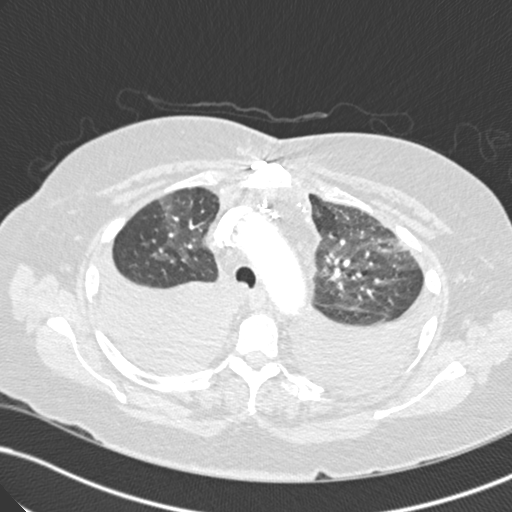
[im 186/239  soft-tissue]
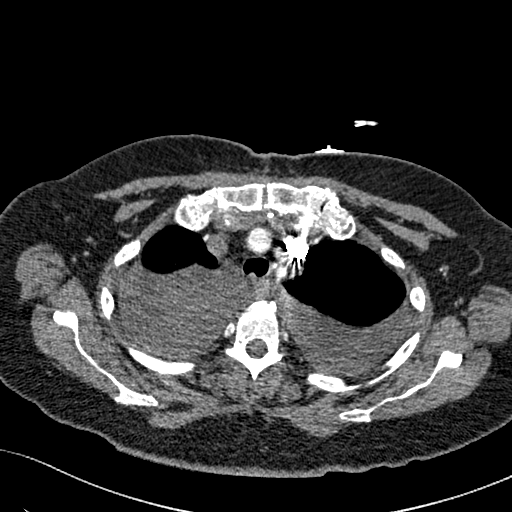
[im 199/239  lung]
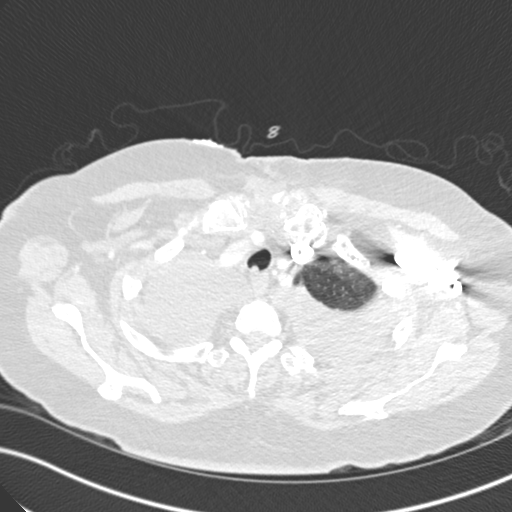
[im 212/239  soft-tissue]
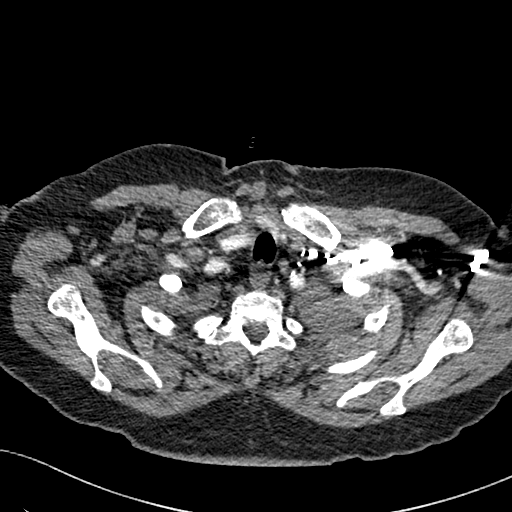
[im 225/239  lung]
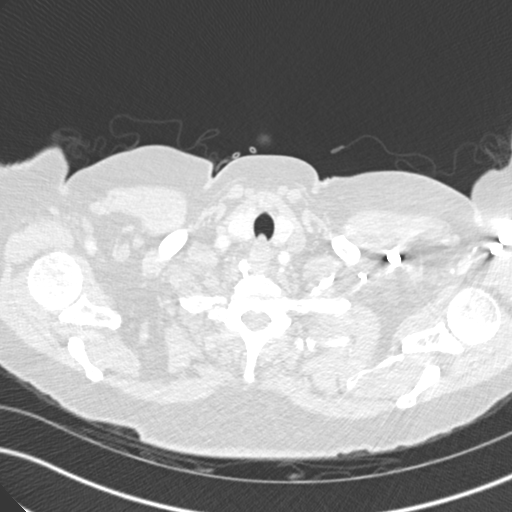

[Series 7: coronal mpr · coronal · 0.49mm/px · 3 of 126 slices shown]
[im 32/126  soft-tissue]
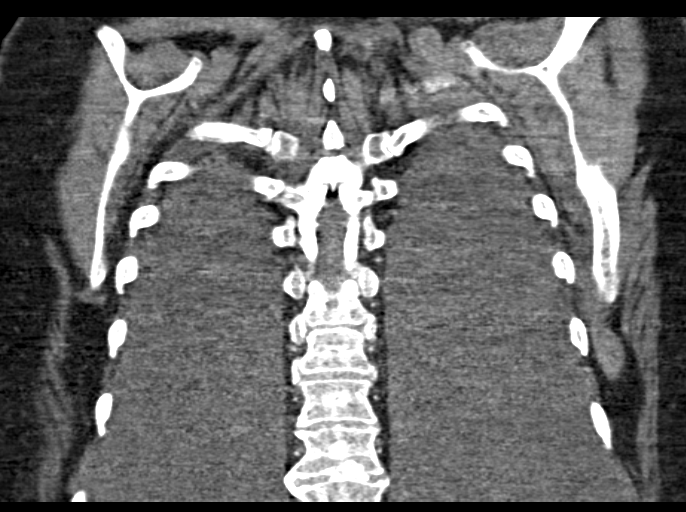
[im 63/126  soft-tissue]
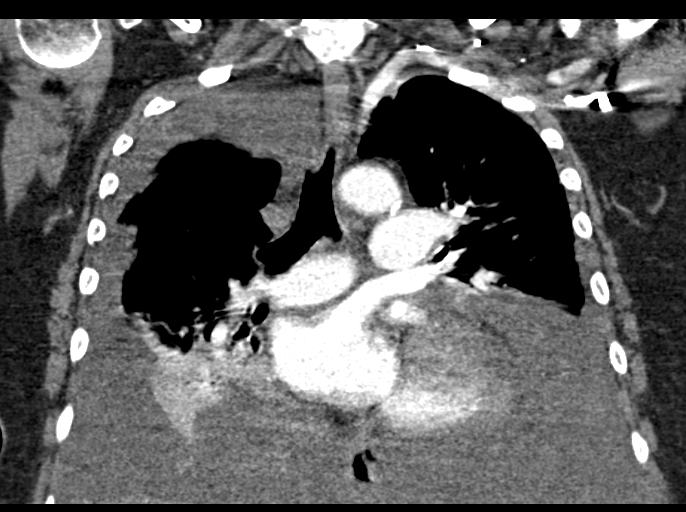
[im 94/126  soft-tissue]
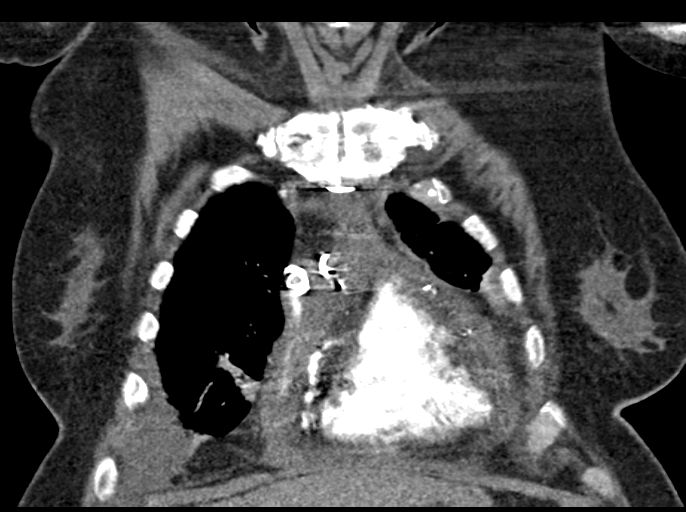

[18 of 46 positions shown; findings below may reference images not displayed]

FINDINGS: Large bilateral pleural effusions are noted with associated
atelectasis of both lower lobes. No pneumothorax is noted. Mild
opacity is noted in the left upper lobe concerning for subsegmental
atelectasis. Status post coronary artery bypass graft. There is no
evidence of thoracic aortic dissection. Ascending aorta is mildly
dilated at 3.8 cm. There is no definite evidence of pulmonary
embolus. No significant mediastinal mass or adenopathy is noted. No
significant osseous abnormality is noted.

Review of the MIP images confirms the above findings.
IMPRESSION: No definite evidence of pulmonary embolus.

Large bilateral pleural effusions are noted with associated
atelectasis of both lower lobes.

Mild dilatation of ascending thoracic aorta measured at 3.8 cm.

## 2016-07-11 ENCOUNTER — Encounter: Payer: Self-pay | Admitting: Cardiology

## 2016-07-11 ENCOUNTER — Ambulatory Visit (INDEPENDENT_AMBULATORY_CARE_PROVIDER_SITE_OTHER): Payer: Medicare Other | Admitting: Cardiology

## 2016-07-11 VITALS — BP 163/73 | HR 73 | Ht 64.5 in | Wt 204.4 lb

## 2016-07-11 DIAGNOSIS — E782 Mixed hyperlipidemia: Secondary | ICD-10-CM

## 2016-07-11 DIAGNOSIS — I10 Essential (primary) hypertension: Secondary | ICD-10-CM

## 2016-07-11 DIAGNOSIS — I2510 Atherosclerotic heart disease of native coronary artery without angina pectoris: Secondary | ICD-10-CM | POA: Diagnosis not present

## 2016-07-11 DIAGNOSIS — E1165 Type 2 diabetes mellitus with hyperglycemia: Secondary | ICD-10-CM

## 2016-07-11 DIAGNOSIS — E1159 Type 2 diabetes mellitus with other circulatory complications: Secondary | ICD-10-CM | POA: Diagnosis not present

## 2016-07-11 NOTE — Patient Instructions (Signed)

## 2016-07-11 NOTE — Progress Notes (Signed)
Cardiology Office Note  Date: 07/11/2016   ID: Joyce [name], DOB 1940/10/23, MRN [id number]  PCP: Allie Dimmer, MD  Primary Cardiologist: Rozann Lesches, MD   Chief Complaint  Patient presents with  . Coronary Artery Disease    History of Present Illness: Joyce [name] is a 76 y.o. female last seen in June 2017. She is here today with her daughter for a follow-up visit. She does not report any interval angina symptoms or unusual shortness of breath with activity. She has had some recent left lower leg pain, but states that it has resolved. She does not report recurring leg discomfort with activity to suggest claudication. Also has a history of spinal stenosis.  Echocardiogram from March of last year is reviewed below. We discussed the results today.  She had recent lab work with Dr. Jenean Lindau as outlined below. Glucose and lipids were not as well controlled.  I reviewed her ECG today which shows sinus rhythm with nonspecific T-wave changes.  Past Medical History:  Diagnosis Date  . Anemia   . Arthritis   . Bilateral pleural effusion    Postoperative, status post Pleurx catheter and talc treatment - Dr. Prescott Gum  . 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 Chronic kidney disease (CKD), stage III (moderate)   . Chronic lower back pain   . Essential hypertension   . Fibromyalgia   . History of pneumonia   . Hyperlipidemia   . Type II diabetes mellitus ([place])     Past Surgical History:  Procedure Laterality Date  . ABDOMINAL HYSTERECTOMY  1980's  . ANTERIOR CERVICAL DECOMP/DISCECTOMY FUSION  2000's  . APPENDECTOMY  1970's  . BACK SURGERY    . CHEST TUBE INSERTION Left 08/07/2014   Procedure: INSERTION PLEURAL DRAINAGE CATHETER;  Surgeon: Ivin Poot, MD;  Location: [place];  Service: Thoracic;  Laterality: Left;  . CHEST TUBE INSERTION Right 08/12/2014   Procedure: INSERTION  PLEURAL DRAINAGE CATHETER;  Surgeon: Ivin Poot, MD;  Location: [place];  Service: Thoracic;  Laterality: Right;  . CHOLECYSTECTOMY  ~ 2005  . CORONARY ARTERY BYPASS GRAFT N/A 06/15/2014   Procedure: CORONARY ARTERY BYPASS GRAFTING (CABG);  Surgeon: Ivin Poot, MD;  Location: [place];  Service: Open Heart Surgery;  Laterality: N/A;  . INTRAOPERATIVE TRANSESOPHAGEAL ECHOCARDIOGRAM N/A 06/15/2014   Procedure: INTRAOPERATIVE TRANSESOPHAGEAL ECHOCARDIOGRAM;  Surgeon: Ivin Poot, MD;  Location: [place];  Service: Open Heart Surgery;  Laterality: N/A;  . LEFT HEART CATHETERIZATION WITH CORONARY ANGIOGRAM N/A 06/11/2014   Procedure: LEFT HEART CATHETERIZATION WITH CORONARY ANGIOGRAM;  Surgeon: Blane Ohara, MD;  Location: [facility] CATH LAB;  Service: Cardiovascular;  Laterality: N/A;  . LUMBAR DISC SURGERY  1980's X 1; 1990's X  2000's X 1  . REMOVAL OF PLEURAL DRAINAGE CATHETER Bilateral 02/11/2015   Procedure: REMOVAL OF PLEURAL DRAINAGE CATHETER;  Surgeon: Ivin Poot, MD;  Location: [place];  Service: Thoracic;  Laterality: Bilateral;  . TALC PLEURODESIS Bilateral 02/11/2015   Procedure: Pietro Cassis;  Surgeon: Ivin Poot, MD;  Location: [place];  Service: Thoracic;  Laterality: Bilateral;  . TONSILLECTOMY  ~ 1950    Current Outpatient Prescriptions  Medication Sig Dispense Refill  . ACCU-CHEK AVIVA PLUS test strip Inject 1 strip as directed 4 (four) times daily.  2  . amLODipine (NORVASC) 10 MG tablet Take 10 mg  by mouth daily.   5  . aspirin EC 81 MG tablet Take 1 tablet (81 mg total) by mouth daily.    Marland Kitchen atorvastatin (LIPITOR) 40 MG tablet Take 1 tablet (40 mg total) by mouth daily at 6 PM.    . B Complex-C-Folic Acid (DIALYVITE PO) Take 1 tablet by mouth.    . furosemide (LASIX) 40 MG tablet Take 40 mg by mouth daily.    Marland Kitchen GLUCERNA (GLUCERNA) LIQD Take 237 mLs by mouth daily.    . hydrALAZINE (APRESOLINE) 100 MG tablet Take 100 mg by mouth 3 (three) times daily.    Marland Kitchen  HYDROcodone-acetaminophen (NORCO/VICODIN) 5-325 MG per tablet Take 1 tablet by mouth every 6 (six) hours as needed for moderate pain.    Marland Kitchen insulin glargine (LANTUS) 100 UNIT/ML injection Inject 10-40 Units into the skin at bedtime.     . insulin lispro (HUMALOG) 100 UNIT/ML KiwkPen Inject 0-12 Units into the skin 3 (three) times daily with meals.     . Insulin Syringe-Needle U-100 (INSULIN SYRINGE .5CC/30GX5/16") 30G X 5/16" 0.5 ML MISC Inject 0.5 mLs as directed daily.    . Insulin Syringe-Needle U-100 (INSULIN SYRINGE .5CC/30GX5/16") 30G X 5/16" 0.5 ML MISC Inject 0.5 mLs as directed 3 (three) times daily.  3  . lisinopril (PRINIVIL,ZESTRIL) 5 MG tablet Take 5 mg by mouth daily.     Marland Kitchen MISC NATURAL PRODUCT OP Take by mouth daily. TerraZyme    . MISC NATURAL PRODUCTS PO Take by mouth daily. EO Mega    . MISC NATURAL PRODUCTS PO Take by mouth daily. Alpha CRS    . MISC NATURAL PRODUCTS PO Take by mouth daily. Micro Plex VM    . MISC NATURAL PRODUCTS PO Take by mouth daily. Deep Blue Polyphenol Complex    . polyethylene glycol (MIRALAX / GLYCOLAX) packet Take 17 g by mouth daily as needed for mild constipation. 14 each 2  . primidone (MYSOLINE) 50 MG tablet Take 50 mg by mouth daily.   5  . traMADol (ULTRAM) 50 MG tablet Take 1 tablet (50 mg total) by mouth every 6 (six) hours as needed for moderate pain. (Patient taking differently: Take 50 mg by mouth every 4 (four) hours as needed for moderate pain or severe pain. ) 30 tablet 0   No current facility-administered medications for this visit.    Allergies:  Carvedilol; Codeine; and Metoprolol   Social History: The patient  reports that she has never smoked. She has never used smokeless tobacco. She reports that she does not drink alcohol or use drugs.   ROS:  Please see the history of present illness. Otherwise, complete review of systems is positive for none.  All other systems are reviewed and negative.   Physical Exam: VS:  BP (!) 163/73    Pulse 73   Ht 5' 4.5" (1.638 m)   Wt 204 lb 6.4 oz (92.7 kg)   SpO2 97%   BMI 34.54 kg/m , BMI Body mass index is 34.54 kg/m.  Wt Readings from Last 3 Encounters:  07/11/16 204 lb 6.4 oz (92.7 kg)  12/08/15 199 lb (90.3 kg)  08/24/15 196 lb (88.9 kg)    General: Overweight woman, appears comfortable at rest. HEENT: Conjunctiva and lids normal, oropharynx clear. Neck: Supple, no elevated JVP or carotid bruits, no thyromegaly. Lungs: Diminished mid to basilar breath sounds, nonlabored breathing at rest. Cardiac: Regular rate and rhythm, no S3, 2/6 systolic murmur, no pericardial rub. Abdomen: Soft, nontender, bowel sounds present,  no guarding or rebound. Extremities: No pitting edema, distal pulses 2+. Skin: Warm and dry. Musculoskeletal: No kyphosis. Neuropsychiatric: Alert and oriented 3, affect appropriate.  ECG: I personally reviewed the tracing from 06/09/2015 which showed normal sinus rhythm.  Recent Labwork:    Component Value Date/Time   CHOL 105 10/23/2014 1149   TRIG 121.0 10/23/2014 1149   HDL 47.20 10/23/2014 1149   CHOLHDL 2 10/23/2014 1149   VLDL 24.2 10/23/2014 1149   LDLCALC 34 10/23/2014 1149  June 2016: BUN 40, creatinine 1.8, potassium 4.6, sodium 138 January 2018: Cholesterol 174, triglycerides 214, HDL 46, LDL 85, AST 22, ALT 16, potassium 4.7, BUN 29, creatinine 1.7, hemoglobin 11.5, platelets 196  Other Studies Reviewed Today:  Echocardiogram 08/25/2015: Study Conclusions  - Left ventricle: The cavity size was normal. Wall thickness was  increased in a pattern of mild LVH. Systolic function was  vigorous. The estimated ejection fraction was in the range of 65%  to 70%. Wall motion was normal; there were no regional wall  motion abnormalities. Doppler parameters are consistent with  abnormal left ventricular relaxation (grade 1 diastolic  dysfunction). Doppler parameters are consistent with high  ventricular filling pressure. - Aortic  valve: Mildly calcified annulus. Trileaflet; mildly  calcified leaflets. Cusp separation was normal. Mean gradient  (S): 9 mm Hg. Peak gradient (S): 18 mm Hg. Valve area (VTI): 1.74  cm^2. - Mitral valve: There was trivial regurgitation. - Left atrium: The atrium was mildly dilated. - Right atrium: Central venous pressure (est): 3 mm Hg. - Tricuspid valve: There was trivial regurgitation. - Pulmonic valve: There was mild regurgitation. - Pulmonary arteries: Systolic pressure could not be accurately  estimated. - Pericardium, extracardiac: There was no pericardial effusion.  Impressions:  - Mild LVH with LVEF 65-70%. Grade 1 diastolic dysfunction with  increased LV filling pressure. Mild left atrial enlargement.  Trivial mitral regurgitation. Sclerotic aortic valve without  stenosis. Mild pulmonic regurgitation.  Assessment and Plan:  1. Multivessel CAD status post CABG in December 2015. She is not reporting any angina symptoms on medical therapy and we will continue with observation. ECG today shows sinus rhythm with nonspecific T-wave changes.  2. Essential hypertension, blood pressure elevated today. She reports compliance with her medications. She has gained weight over the holidays. Discussed diet and sodium restriction.  3. Less optimal lipid control, possibly diet related. She reports compliance with her Lipitor.  4. Type 2 diabetes mellitus, A1c up to 6.9 from 5.5 at recent check. Continues to follow with Dr. Jenean Lindau.  Current medicines were reviewed with the patient today.   Orders Placed This Encounter  Procedures  . EKG 12-Lead    Disposition: Follow-up in 6 months.  Signed, Satira Sark, MD, [facility] 07/11/2016 3:39 PM    [facility] [place], [place], [place] [address] Phone: [phone]; Fax: [phone]

## 2016-07-14 ENCOUNTER — Encounter: Payer: Self-pay | Admitting: *Deleted

## 2016-07-14 ENCOUNTER — Telehealth: Payer: Self-pay | Admitting: Cardiology

## 2016-07-14 NOTE — Telephone Encounter (Signed)
Letter completed & patient aware to pick up today.

## 2016-07-14 NOTE — Telephone Encounter (Signed)
Joyce [name] needs a note for her daughter Joyce [name] stating that she Came to the office with her mother on 07/11/16 for her work.  Please call patient when note is complete.

## 2016-10-02 NOTE — Progress Notes (Signed)
Cardiology Office Note  Date: 10/03/2016   ID: Joyce [name], DOB 1941-01-06, MRN [id number]  PCP: Allie Dimmer, MD  Primary Cardiologist: Rozann Lesches, MD   Chief Complaint  Patient presents with  . Preoperative cardiac evaluation    History of Present Illness: Joyce [name] is a 76 y.o. female last seen in January. She is referred to the office by Dr. Carloyn Manner for preoperative consultation prior to lumbar spine surgery. At this time I do not have any details regarding timing of operation or planned anesthesia, although presumably this is under general anesthesia and will be done at [facility].   She is here with her daughter today. She has had progressive problems with lower back pain and most recently sciatica pain on the left with decreased mobility. From a cardiac perspective she does not endorse any angina symptoms on medical therapy, has not used nitroglycerin. 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 I reviewed her ECG from earlier this year.  Current cardiac regimen includes aspirin, Norvasc, Lipitor, Lasix, hydralazine, and lisinopril. I reviewed her most recent lab work from January. She continues to follow with Dr. Jenean Lindau.  Past Medical History:  Diagnosis Date  . Anemia   . Arthritis   . Bilateral pleural effusion    Postoperative, status post Pleurx catheter and talc treatment - Dr. Prescott Gum  . 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 Chronic kidney disease (CKD), stage III (moderate)   . Chronic lower back pain   . Essential hypertension   . Fibromyalgia   . History of pneumonia   . Hyperlipidemia   . Type II diabetes mellitus ([place])     Past Surgical History:  Procedure Laterality Date  . ABDOMINAL HYSTERECTOMY  1980's  . ANTERIOR CERVICAL DECOMP/DISCECTOMY FUSION  2000's  . APPENDECTOMY  1970's  .  BACK SURGERY    . CHEST TUBE INSERTION Left 08/07/2014   Procedure: INSERTION PLEURAL DRAINAGE CATHETER;  Surgeon: Ivin Poot, MD;  Location: [place];  Service: Thoracic;  Laterality: Left;  . CHEST TUBE INSERTION Right 08/12/2014   Procedure: INSERTION PLEURAL DRAINAGE CATHETER;  Surgeon: Ivin Poot, MD;  Location: [place];  Service: Thoracic;  Laterality: Right;  . CHOLECYSTECTOMY  ~ 2005  . CORONARY ARTERY BYPASS GRAFT N/A 06/15/2014   Procedure: CORONARY ARTERY BYPASS GRAFTING (CABG);  Surgeon: Ivin Poot, MD;  Location: [place];  Service: Open Heart Surgery;  Laterality: N/A;  . INTRAOPERATIVE TRANSESOPHAGEAL ECHOCARDIOGRAM N/A 06/15/2014   Procedure: INTRAOPERATIVE TRANSESOPHAGEAL ECHOCARDIOGRAM;  Surgeon: Ivin Poot, MD;  Location: [place];  Service: Open Heart Surgery;  Laterality: N/A;  . LEFT HEART CATHETERIZATION WITH CORONARY ANGIOGRAM N/A 06/11/2014   Procedure: LEFT HEART CATHETERIZATION WITH CORONARY ANGIOGRAM;  Surgeon: Blane Ohara, MD;  Location: [facility] CATH LAB;  Service: Cardiovascular;  Laterality: N/A;  . LUMBAR DISC SURGERY  1980's X 1; 1990's X  2000's X 1  . REMOVAL OF PLEURAL DRAINAGE CATHETER Bilateral 02/11/2015   Procedure: REMOVAL OF PLEURAL DRAINAGE CATHETER;  Surgeon: Ivin Poot, MD;  Location: [place];  Service: Thoracic;  Laterality: Bilateral;  . TALC PLEURODESIS Bilateral 02/11/2015   Procedure: Pietro Cassis;  Surgeon: Ivin Poot, MD;  Location: [place];  Service: Thoracic;  Laterality: Bilateral;  . TONSILLECTOMY  ~ 1950  Current Outpatient Prescriptions  Medication Sig Dispense Refill  . ACCU-CHEK AVIVA PLUS test strip Inject 1 strip as directed 4 (four) times daily.  2  . amLODipine (NORVASC) 10 MG tablet Take 10 mg by mouth daily.   5  . aspirin EC 81 MG tablet Take 1 tablet (81 mg total) by mouth daily.    Marland Kitchen atorvastatin (LIPITOR) 40 MG tablet Take 1 tablet (40 mg total) by mouth daily at 6 PM.    . B Complex-C-Folic Acid  (DIALYVITE PO) Take 1 tablet by mouth.    . furosemide (LASIX) 20 MG tablet Take 20 mg by mouth.    Marland Kitchen GLUCERNA (GLUCERNA) LIQD Take 237 mLs by mouth daily.    . hydrALAZINE (APRESOLINE) 100 MG tablet Take 100 mg by mouth 3 (three) times daily.    Marland Kitchen HYDROcodone-acetaminophen (NORCO/VICODIN) 5-325 MG per tablet Take 1 tablet by mouth every 6 (six) hours as needed for moderate pain.    Marland Kitchen insulin glargine (LANTUS) 100 UNIT/ML injection Inject 10-40 Units into the skin at bedtime.     . insulin lispro (HUMALOG) 100 UNIT/ML KiwkPen Inject 0-12 Units into the skin 3 (three) times daily with meals.     . Insulin Syringe-Needle U-100 (INSULIN SYRINGE .5CC/30GX5/16") 30G X 5/16" 0.5 ML MISC Inject 0.5 mLs as directed daily.    . Insulin Syringe-Needle U-100 (INSULIN SYRINGE .5CC/30GX5/16") 30G X 5/16" 0.5 ML MISC Inject 0.5 mLs as directed 3 (three) times daily.  3  . lisinopril (PRINIVIL,ZESTRIL) 5 MG tablet Take 5 mg by mouth daily.     Marland Kitchen MISC NATURAL PRODUCT OP Take by mouth daily. TerraZyme    . MISC NATURAL PRODUCTS PO Take by mouth daily. EO Mega    . MISC NATURAL PRODUCTS PO Take by mouth daily. Alpha CRS    . MISC NATURAL PRODUCTS PO Take by mouth daily. Micro Plex VM    . MISC NATURAL PRODUCTS PO Take by mouth daily. Deep Blue Polyphenol Complex    . polyethylene glycol (MIRALAX / GLYCOLAX) packet Take 17 g by mouth daily as needed for mild constipation. 14 each 2  . primidone (MYSOLINE) 50 MG tablet Take 50 mg by mouth daily.   5  . traMADol (ULTRAM) 50 MG tablet Take 1 tablet (50 mg total) by mouth every 6 (six) hours as needed for moderate pain. (Patient taking differently: Take 50 mg by mouth every 4 (four) hours as needed for moderate pain or severe pain. ) 30 tablet 0   No current facility-administered medications for this visit.    Allergies:  Carvedilol; Codeine; and Metoprolol   Social History: The patient  reports that she has never smoked. She has never used smokeless tobacco. She  reports that she does not drink alcohol or use drugs.   ROS:  Please see the history of present illness. Otherwise, complete review of systems is positive for chronic back pain.  All other systems are reviewed and negative.   Physical Exam: VS:  BP (!) 155/85 (BP Location: Left Arm, Patient Position: Sitting, Cuff Size: Normal)   Pulse 78   Ht 5\' 5"  (1.651 m)   Wt 204 lb (92.5 kg)   SpO2 99%   BMI 33.95 kg/m , BMI Body mass index is 33.95 kg/m.  Wt Readings from Last 3 Encounters:  10/03/16 204 lb (92.5 kg)  07/11/16 204 lb 6.4 oz (92.7 kg)  12/08/15 199 lb (90.3 kg)    General: Overweight woman, appears comfortable at rest. HEENT: Conjunctiva  and lids normal, oropharynx clear. Neck: Supple, no elevated JVP or carotid bruits, no thyromegaly. Lungs: Diminished mid to basilar breath sounds, nonlabored breathing at rest. Cardiac: Regular rate and rhythm, no S3, 2/6 systolic murmur, no pericardial rub. Abdomen: Soft, nontender, bowel sounds present. Extremities: No pitting edema, distal pulses 2+. Skin: Warm and dry. Musculoskeletal: No kyphosis. Neuropsychiatric: Alert and oriented 3, affect appropriate.  ECG: I personally reviewed the tracing from 07/11/2016 which showed sinus rhythm with nonspecific T-wave changes.  Recent Labwork:    Component Value Date/Time   CHOL 105 10/23/2014 1149   TRIG 121.0 10/23/2014 1149   HDL 47.20 10/23/2014 1149   CHOLHDL 2 10/23/2014 1149   VLDL 24.2 10/23/2014 1149   LDLCALC 34 10/23/2014 1149  January 2018: Potassium 4.7, BUN 29, creatinine 1.7, hemoglobin 11.5, platelets 196, AST 22, ALT 16, cholesterol 174, triglycerides 214, HDL 46, LDL 85, TSH 3.38  Other Studies Reviewed Today:  Echocardiogram 08/25/2015: Study Conclusions  - Left ventricle: The cavity size was normal. Wall thickness was   increased in a pattern of mild LVH. Systolic function was   vigorous. The estimated ejection fraction was in the range of 65%   to 70%. Wall  motion was normal; there were no regional wall   motion abnormalities. Doppler parameters are consistent with   abnormal left ventricular relaxation (grade 1 diastolic   dysfunction). Doppler parameters are consistent with high   ventricular filling pressure. - Aortic valve: Mildly calcified annulus. Trileaflet; mildly   calcified leaflets. Cusp separation was normal. Mean gradient   (S): 9 mm Hg. Peak gradient (S): 18 mm Hg. Valve area (VTI): 1.74   cm^2. - Mitral valve: There was trivial regurgitation. - Left atrium: The atrium was mildly dilated. - Right atrium: Central venous pressure (est): 3 mm Hg. - Tricuspid valve: There was trivial regurgitation. - Pulmonic valve: There was mild regurgitation. - Pulmonary arteries: Systolic pressure could not be accurately   estimated. - Pericardium, extracardiac: There was no pericardial effusion.  Impressions:  - Mild LVH with LVEF 65-70%. Grade 1 diastolic dysfunction with   increased LV filling pressure. Mild left atrial enlargement.   Trivial mitral regurgitation. Sclerotic aortic valve without   stenosis. Mild pulmonic regurgitation.  Assessment and Plan:  1. 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 She is functionally limited by progressive lower back pain and left-sided sciatica. ECG from January was nonspecific, and she reports compliance with medical therapy. She has undergone coronary revascularization within 3 years, should be able to proceed with lumbar spine surgery under general anesthesia at overall intermediate perioperative cardiac risk.  2. Essential hypertension, no changes made present regimen. I reviewed her recent lab work. Keep follow-up with Dr. Jenean Lindau.  3. Hyperlipidemia, continues on Lipitor. LDL 85.  4. CKD stage III, last creatinine 1.7. She follows with Dr. Jenean Lindau.  Current  medicines were reviewed with the patient today.  Disposition: Follow-up in 6 months.  Signed, Satira Sark, MD, [facility] 10/03/2016 9:49 AM    [facility] [place], [place], [place] [address] Phone: [phone]; Fax: [phone]

## 2016-10-03 ENCOUNTER — Ambulatory Visit (INDEPENDENT_AMBULATORY_CARE_PROVIDER_SITE_OTHER): Payer: Medicare Other | Admitting: Cardiology

## 2016-10-03 ENCOUNTER — Encounter: Payer: Self-pay | Admitting: *Deleted

## 2016-10-03 ENCOUNTER — Encounter: Payer: Self-pay | Admitting: Cardiology

## 2016-10-03 VITALS — BP 155/85 | HR 78 | Ht 65.0 in | Wt 204.0 lb

## 2016-10-03 DIAGNOSIS — E782 Mixed hyperlipidemia: Secondary | ICD-10-CM | POA: Diagnosis not present

## 2016-10-03 DIAGNOSIS — Z01810 Encounter for preprocedural cardiovascular examination: Secondary | ICD-10-CM | POA: Diagnosis not present

## 2016-10-03 DIAGNOSIS — N183 Chronic kidney disease, stage 3 unspecified: Secondary | ICD-10-CM

## 2016-10-03 DIAGNOSIS — I2510 Atherosclerotic heart disease of native coronary artery without angina pectoris: Secondary | ICD-10-CM

## 2016-10-03 DIAGNOSIS — I10 Essential (primary) hypertension: Secondary | ICD-10-CM | POA: Diagnosis not present

## 2016-10-03 NOTE — Patient Instructions (Signed)

## 2016-10-06 ENCOUNTER — Encounter: Payer: Medicare Other | Admitting: Cardiology

## 2017-04-03 ENCOUNTER — Encounter: Payer: Self-pay | Admitting: Cardiology

## 2017-04-03 ENCOUNTER — Ambulatory Visit (INDEPENDENT_AMBULATORY_CARE_PROVIDER_SITE_OTHER): Payer: Medicare Other | Admitting: Cardiology

## 2017-04-03 VITALS — BP 158/62 | HR 72 | Ht 65.0 in | Wt 209.0 lb

## 2017-04-03 DIAGNOSIS — Z23 Encounter for immunization: Secondary | ICD-10-CM

## 2017-04-03 DIAGNOSIS — E782 Mixed hyperlipidemia: Secondary | ICD-10-CM | POA: Diagnosis not present

## 2017-04-03 DIAGNOSIS — I2510 Atherosclerotic heart disease of native coronary artery without angina pectoris: Secondary | ICD-10-CM

## 2017-04-03 DIAGNOSIS — R0602 Shortness of breath: Secondary | ICD-10-CM | POA: Diagnosis not present

## 2017-04-03 DIAGNOSIS — N183 Chronic kidney disease, stage 3 unspecified: Secondary | ICD-10-CM

## 2017-04-03 NOTE — Progress Notes (Signed)
Cardiology Office Note  Date: 04/03/2017   ID: Joyce [name], DOB 1941/03/01, MRN [id number]  PCP: Allie Dimmer, MD  Primary Cardiologist: Rozann Lesches, MD   Chief Complaint  Patient presents with  . Coronary Artery Disease    History of Present Illness: Joyce [name] is a 76 y.o. female last seen in April. She presents for a routine follow-up visit. Since last encounter she underwent spine surgery with Dr. Carloyn Manner. She states that her back pain is much better, but still not resolved and still represents a limitation to her activity. She does not report any angina symptoms, no palpitations or syncope.  She also tells me that she has had significant trouble with dyspnea on exertion in the high humidity, a feeling of chest congestion. She reports being seen by a pulmonologist in [place] and was not comfortable with their assessment. It sounds like she may have had PFTs but I am not entirely certain as no records are available for review. She is requesting referral to pulmonary in [place].  I reviewed her recent lab work obtained by Dr. Jenean Lindau. Current cardiac regimen includes aspirin, Norvasc, Lipitor, Lasix, hydralazine and lisinopril. Recent LDL was 49. She continues to show evidence of CKD stage 3, creatinine 1.7.  Her last echocardiogram was in March 2017 as outlined below.  Past Medical History:  Diagnosis Date  . Anemia   . Arthritis   . Bilateral pleural effusion    Postoperative, status post Pleurx catheter and talc treatment - Dr. Prescott Gum  . 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 Chronic kidney disease (CKD), stage III (moderate) ([facility])   . Chronic lower back pain   . Essential hypertension   . Fibromyalgia   . History of pneumonia   . Hyperlipidemia   . Type II diabetes mellitus ([place])     Past Surgical History:  Procedure Laterality Date  . ABDOMINAL  HYSTERECTOMY  1980's  . ANTERIOR CERVICAL DECOMP/DISCECTOMY FUSION  2000's  . APPENDECTOMY  1970's  . BACK SURGERY    . CHEST TUBE INSERTION Left 08/07/2014   Procedure: INSERTION PLEURAL DRAINAGE CATHETER;  Surgeon: Ivin Poot, MD;  Location: [place];  Service: Thoracic;  Laterality: Left;  . CHEST TUBE INSERTION Right 08/12/2014   Procedure: INSERTION PLEURAL DRAINAGE CATHETER;  Surgeon: Ivin Poot, MD;  Location: [place];  Service: Thoracic;  Laterality: Right;  . CHOLECYSTECTOMY  ~ 2005  . CORONARY ARTERY BYPASS GRAFT N/A 06/15/2014   Procedure: CORONARY ARTERY BYPASS GRAFTING (CABG);  Surgeon: Ivin Poot, MD;  Location: [place];  Service: Open Heart Surgery;  Laterality: N/A;  . INTRAOPERATIVE TRANSESOPHAGEAL ECHOCARDIOGRAM N/A 06/15/2014   Procedure: INTRAOPERATIVE TRANSESOPHAGEAL ECHOCARDIOGRAM;  Surgeon: Ivin Poot, MD;  Location: [place];  Service: Open Heart Surgery;  Laterality: N/A;  . LEFT HEART CATHETERIZATION WITH CORONARY ANGIOGRAM N/A 06/11/2014   Procedure: LEFT HEART CATHETERIZATION WITH CORONARY ANGIOGRAM;  Surgeon: Blane Ohara, MD;  [facility] CATH LAB;  Service: Cardiovascular;  Laterality: N/A;  . LUMBAR DISC SURGERY  1980's X 1; 1990's X  2000's X 1  . REMOVAL OF PLEURAL DRAINAGE CATHETER Bilateral 02/11/2015   Procedure: REMOVAL OF PLEURAL DRAINAGE CATHETER;  Surgeon: Ivin Poot, MD;  Location: [place];  Service: Thoracic;  Laterality: Bilateral;  . TALC PLEURODESIS Bilateral 02/11/2015   Procedure: Pietro Cassis;  Surgeon: Collier Salina  Prescott Gum, MD;  Location[facility];  Service: Thoracic;  Laterality: Bilateral;  . TONSILLECTOMY  ~ 1950    Current Outpatient Prescriptions  Medication Sig Dispense Refill  . ACCU-CHEK AVIVA PLUS test strip Inject 1 strip as directed 4 (four) times daily.  2  . amLODipine (NORVASC) 10 MG tablet Take 10 mg by mouth daily.   5  . aspirin EC 81 MG tablet Take 1 tablet (81 mg total) by mouth daily.    Marland Kitchen atorvastatin  (LIPITOR) 40 MG tablet Take 1 tablet (40 mg total) by mouth daily at 6 PM.    . B Complex-C-Folic Acid (DIALYVITE PO) Take 1 tablet by mouth.    . Cholecalciferol (VITAMIN D3) 2000 units TABS Take by mouth daily.    . furosemide (LASIX) 20 MG tablet Take 20 mg by mouth.    Marland Kitchen GLUCERNA (GLUCERNA) LIQD Take 237 mLs by mouth daily.    . hydrALAZINE (APRESOLINE) 100 MG tablet Take 100 mg by mouth 3 (three) times daily.    . insulin glargine (LANTUS) 100 UNIT/ML injection Inject 10-40 Units into the skin at bedtime.     . insulin lispro (HUMALOG) 100 UNIT/ML KiwkPen Inject 0-12 Units into the skin 3 (three) times daily with meals.     . Insulin Syringe-Needle U-100 (INSULIN SYRINGE .5CC/30GX5/16") 30G X 5/16" 0.5 ML MISC Inject 0.5 mLs as directed daily.    . Insulin Syringe-Needle U-100 (INSULIN SYRINGE .5CC/30GX5/16") 30G X 5/16" 0.5 ML MISC Inject 0.5 mLs as directed 3 (three) times daily.  3  . lisinopril (PRINIVIL,ZESTRIL) 5 MG tablet Take 5 mg by mouth daily.     Marland Kitchen MISC NATURAL PRODUCT OP Take by mouth daily. TerraZyme    . MISC NATURAL PRODUCTS PO Take by mouth daily. EO Mega    . MISC NATURAL PRODUCTS PO Take by mouth daily. Alpha CRS    . MISC NATURAL PRODUCTS PO Take by mouth daily. Micro Plex VM    . MISC NATURAL PRODUCTS PO Take by mouth daily. Deep Blue Polyphenol Complex    . polyethylene glycol (MIRALAX / GLYCOLAX) packet Take 17 g by mouth daily as needed for mild constipation. 14 each 2  . primidone (MYSOLINE) 50 MG tablet Take 50 mg by mouth daily.   5  . traMADol (ULTRAM) 50 MG tablet Take 1 tablet (50 mg total) by mouth every 6 (six) hours as needed for moderate pain. (Patient taking differently: Take 50 mg by mouth every 4 (four) hours as needed for moderate pain or severe pain. ) 30 tablet 0   No current facility-administered medications for this visit.    Allergies:  Carvedilol; Codeine; and Metoprolol   Social History: The patient  reports that she has never smoked. She has  never used smokeless tobacco. She reports that she does not drink alcohol or use drugs.   ROS:  Please see the history of present illness. Otherwise, complete review of systems is positive for chronic back pain, improved but not resolved. No fevers or chills, no productive cough, no orthopnea or PND. All other systems are reviewed and negative.   Physical Exam: VS:  BP (!) 158/62   Pulse 72   Ht 5\' 5"  (1.651 m)   Wt 209 lb (94.8 kg)   SpO2 98%   BMI 34.78 kg/m , BMI Body mass index is 34.78 kg/m.  Wt Readings from Last 3 Encounters:  04/03/17 209 lb (94.8 kg)  10/03/16 204 lb (92.5 kg)  07/11/16 204 lb  6.4 oz (92.7 kg)    General: Obese woman, appears comfortable at rest. HEENT: Conjunctiva and lids normal, oropharynx clear. Neck: Supple, no elevated JVP or carotid bruits, no thyromegaly. Lungs: No wheezing, nonlabored breathing at rest. Cardiac: Regular rate and rhythm, no S3, 2/6 systolic murmur, no pericardial rub. Abdomen: Soft, nontender, bowel sounds present, no guarding or rebound. Extremities: No pitting edema, distal pulses 2+. Skin: Warm and dry. Musculoskeletal: No kyphosis. Neuropsychiatric: Alert and oriented x3, affect grossly appropriate.  ECG: I personally reviewed the tracing from 07/11/2016 which showed sinus rhythm with nonspecific T-wave changes.  Recent Labwork:    Component Value Date/Time   CHOL 105 10/23/2014 1149   TRIG 121.0 10/23/2014 1149   HDL 47.20 10/23/2014 1149   CHOLHDL 2 10/23/2014 1149   VLDL 24.2 10/23/2014 1149   LDLCALC 34 10/23/2014 1149  August 2018: Hemoglobin 10.8, platelets 153, potassium 4.3, BUN 37, creatinine 1.75, hemoglobin A1c 6.3, AST 21, ALT 15, cholesterol 117, HDL 39, triglycerides 141, LDL 49, TSH 3.55  Other Studies Reviewed Today:  Echocardiogram 08/25/2015: Study Conclusions  - Left ventricle: The cavity size was normal. Wall thickness was   increased in a pattern of mild LVH. Systolic function was   vigorous.  The estimated ejection fraction was in the range of 65%   to 70%. Wall motion was normal; there were no regional wall   motion abnormalities. Doppler parameters are consistent with   abnormal left ventricular relaxation (grade 1 diastolic   dysfunction). Doppler parameters are consistent with high   ventricular filling pressure. - Aortic valve: Mildly calcified annulus. Trileaflet; mildly   calcified leaflets. Cusp separation was normal. Mean gradient   (S): 9 mm Hg. Peak gradient (S): 18 mm Hg. Valve area (VTI): 1.74   cm^2. - Mitral valve: There was trivial regurgitation. - Left atrium: The atrium was mildly dilated. - Right atrium: Central venous pressure (est): 3 mm Hg. - Tricuspid valve: There was trivial regurgitation. - Pulmonic valve: There was mild regurgitation. - Pulmonary arteries: Systolic pressure could not be accurately   estimated. - Pericardium, extracardiac: There was no pericardial effusion.  Impressions:  - Mild LVH with LVEF 65-70%. Grade 1 diastolic dysfunction with   increased LV filling pressure. Mild left atrial enlargement.   Trivial mitral regurgitation. Sclerotic aortic valve without   stenosis. Mild pulmonic regurgitation.  Assessment and Plan:  1. Multivessel CAD status post CABG in December 2015. She does not report angina symptoms on medical therapy. LVEF was 65-70% with mild diastolic dysfunction by echocardiogram in March of last year. Plan to continue current cardiac regimen and observation.  2. Dyspnea on exertion, mainly triggered by high humidity and heat. No definite wheezing but a feeling of chest congestion. She states that she saw a pulmonologist in [place], and is requesting referral to pulmonary in [place] for a second opinion. It sounds like she may have had PFTs but I do not have these records. She does have a history of postoperative pleural effusions requiring Pleurx catheter drainage after her CABG. We will obtain an  echocardiogram to ensure no change in LVEF as well.  3. Hyperlipidemia, continues on Lipitor with recent LDL 49. LFTs normal.  4. CKD stage 3, creatinine 1.7.  Current medicines were reviewed with the patient today.   Orders Placed This Encounter  Procedures  . Ambulatory referral to Pulmonology  . ECHOCARDIOGRAM COMPLETE    Disposition: Follow-up in 6 months.  Signed, Satira Sark, MD, [facility] 04/03/2017 11:15 AM  [facility] at [place], [place], [place] [address] Phone: [phone]; Fax: [phone]

## 2017-04-03 NOTE — Patient Instructions (Signed)
Medication Instructions:  Your physician recommends that you continue on your current medications as directed. Please refer to the Current Medication list given to you today.  Labwork: NONE  Testing/Procedures: Your physician has requested that you have an echocardiogram. Echocardiography is a painless test that uses sound waves to create images of your heart. It provides your doctor with information about the size and shape of your heart and how well your heart's chambers and valves are working. This procedure takes approximately one hour. There are no restrictions for this procedure.  Follow-Up: Your physician wants you to follow-up in: 6 MONTHS WITH DR. Domenic Polite You will receive a reminder letter in the mail two months in advance. If you don't receive a letter, please call our office to schedule the follow-up appointment.  Any Other Special Instructions Will Be Listed Below (If Applicable). You have been referred to [facility]   If you need a refill on your cardiac medications before your next appointment, please call your pharmacy.

## 2017-04-05 ENCOUNTER — Other Ambulatory Visit: Payer: Medicare Other

## 2017-04-12 ENCOUNTER — Encounter: Payer: Self-pay | Admitting: Emergency Medicine

## 2017-04-12 ENCOUNTER — Encounter: Payer: Self-pay | Admitting: *Deleted

## 2017-04-12 ENCOUNTER — Ambulatory Visit (INDEPENDENT_AMBULATORY_CARE_PROVIDER_SITE_OTHER): Payer: Medicare Other | Admitting: Emergency Medicine

## 2017-04-12 ENCOUNTER — Ambulatory Visit (INDEPENDENT_AMBULATORY_CARE_PROVIDER_SITE_OTHER)
Admission: RE | Admit: 2017-04-12 | Discharge: 2017-04-12 | Disposition: A | Discharge status: Home / Self Care | Payer: Medicare Other | Source: Ambulatory Visit | Attending: Emergency Medicine | Admitting: Emergency Medicine

## 2017-04-12 VITALS — BP 150/82 | HR 80 | Ht 65.0 in | Wt 208.0 lb

## 2017-04-12 DIAGNOSIS — R0609 Other forms of dyspnea: Secondary | ICD-10-CM

## 2017-04-12 DIAGNOSIS — I2510 Atherosclerotic heart disease of native coronary artery without angina pectoris: Secondary | ICD-10-CM

## 2017-04-12 NOTE — Patient Instructions (Signed)
Your walking oximetry today did not show any low oxygen levels.  We will repeat your pulmonary function testing to compare to 2015 We will perform a chest x-ray today We will follow your echocardiogram results Follow with Dr Lamonte Sakai next available with full PFT same day.

## 2017-04-12 NOTE — Assessment & Plan Note (Signed)
Exertional dyspnea, etiology unclear and she had normal airflows, normal pulmonary function 06/12/14. Need to rule out reaccumulation of pleural fluid. Check chest x-ray today. We will repeat her pulmonary function testing to compare with her prior. If any evidence of obstructive lung disease then we will initiate bronchodilators. She has an echocardiogram ordered, await that result. There could be a significant component of deconditioning here. Her dyspnea started postop CABG, she has also been limited more recently by back arthritis and a back surgery.   Your walking oximetry today did not show any low oxygen levels.  We will repeat your pulmonary function testing to compare to 2015 We will perform a chest x-ray today We will follow your echocardiogram results Follow with Dr Lamonte Sakai next available with full PFT same day.

## 2017-04-12 NOTE — Progress Notes (Signed)
Subjective:    Patient ID: Joyce [name], female    DOB: Mar 21, 1941, 76 y.o.   MRN: [id number]   HPI 76 year old never smoker, history of coronary disease/CABG, diabetes, hypertension, talc and bilateral Pleurx for pleural effusion following his cardiac surgery. Underwent spine surgery in the last year that limits her functional capacity to some degree, done in 09/2016. She is followed by Dr. Domenic Polite with cardiology in [place]. She's managed medically without any clear evidence for angina or active coronary disease. She is referred today for eval of exertional SOB that has been present since 2015. She is able to walk at a slow pace, has to stop after about 30 minutes. Quite limited on flat ground at rapid pace. Humidity causes her to be SOB as well, even while still. She is having exertional chest tightness, no wheeze or stridor. Her exercise is limited, due to back and breathing. She has a dry cough, non-productive cough, wakes her at night. Walking oximetry today on room air reassuring although she did have a short walk distance.  She had PFT 06/12/14 that I have reviewed > normal airflows, normal volumes and DLCO.    Review of Systems  Constitutional: Negative for fever and unexpected weight change.  HENT: Positive for congestion. Negative for dental problem, ear pain, nosebleeds, postnasal drip, rhinorrhea, sinus pressure, sneezing, sore throat and trouble swallowing.   Eyes: Negative for redness and itching.  Respiratory: Positive for cough and shortness of breath. Negative for chest tightness and wheezing.   Cardiovascular: Negative for palpitations and leg swelling.  Gastrointestinal: Negative for nausea and vomiting.  Genitourinary: Negative for dysuria.  Musculoskeletal: Negative for joint swelling.  Skin: Negative for rash.  Neurological: Negative for headaches.  Hematological: Does not bruise/bleed easily.  Psychiatric/Behavioral: Negative for dysphoric mood. The patient is not  nervous/anxious.     Past Medical History:  Diagnosis Date  . Anemia   . Arthritis   . Bilateral pleural effusion    Postoperative, status post Pleurx catheter and talc treatment - Dr. Prescott Gum  . 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 Chronic kidney disease (CKD), stage III (moderate) (HCC)   . Chronic lower back pain   . Essential hypertension   . Fibromyalgia   . History of pneumonia   . Hyperlipidemia   . Type II diabetes mellitus (HCC)      Family History  Problem Relation Age of Onset  . Stroke Father   . Diabetes Mother   . Hypertension Mother   . Alcoholism Brother   . Heart disease Sister   . Alcoholism Brother      Social History   Social History  . Marital status: Widowed    Spouse name: N/A  . Number of children: N/A  . Years of education: N/A   Occupational History  . Not on file.   Social History Main Topics  . Smoking status: Never Smoker  . Smokeless tobacco: Never Used  . Alcohol use No  . Drug use: No  . Sexual activity: No   Other Topics Concern  . Not on file   Social History Narrative  . 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   Has lived [place]  Allergies  Allergen Reactions  . Carvedilol Other (See Comments)    confusion  . Codeine Other (See Comments)  confusion  . Metoprolol Palpitations     Outpatient Medications Prior to Visit  Medication Sig Dispense Refill  . ACCU-CHEK AVIVA PLUS test strip Inject 1 strip as directed 4 (four) times daily.  2  . amLODipine (NORVASC) 10 MG tablet Take 10 mg by mouth daily.   5  . aspirin EC 81 MG tablet Take 1 tablet (81 mg total) by mouth daily.    Marland Kitchen atorvastatin (LIPITOR) 40 MG tablet Take 1 tablet (40 mg total) by mouth daily at 6 PM.    . B Complex-C-Folic Acid (DIALYVITE PO) Take 1 tablet by mouth.    . Cholecalciferol (VITAMIN D3) 2000 units TABS Take by  mouth daily.    . furosemide (LASIX) 20 MG tablet Take 20 mg by mouth.    Marland Kitchen GLUCERNA (GLUCERNA) LIQD Take 237 mLs by mouth daily.    . hydrALAZINE (APRESOLINE) 100 MG tablet Take 100 mg by mouth 3 (three) times daily.    . insulin glargine (LANTUS) 100 UNIT/ML injection Inject 10-40 Units into the skin at bedtime.     . insulin lispro (HUMALOG) 100 UNIT/ML KiwkPen Inject 0-12 Units into the skin 3 (three) times daily with meals.     . Insulin Syringe-Needle U-100 (INSULIN SYRINGE .5CC/30GX5/16") 30G X 5/16" 0.5 ML MISC Inject 0.5 mLs as directed daily.    . Insulin Syringe-Needle U-100 (INSULIN SYRINGE .5CC/30GX5/16") 30G X 5/16" 0.5 ML MISC Inject 0.5 mLs as directed 3 (three) times daily.  3  . lisinopril (PRINIVIL,ZESTRIL) 5 MG tablet Take 5 mg by mouth daily.     Marland Kitchen MISC NATURAL PRODUCT OP Take by mouth daily. TerraZyme    . MISC NATURAL PRODUCTS PO Take by mouth daily. EO Mega    . MISC NATURAL PRODUCTS PO Take by mouth daily. Alpha CRS    . MISC NATURAL PRODUCTS PO Take by mouth daily. Micro Plex VM    . MISC NATURAL PRODUCTS PO Take by mouth daily. Deep Blue Polyphenol Complex    . polyethylene glycol (MIRALAX / GLYCOLAX) packet Take 17 g by mouth daily as needed for mild constipation. 14 each 2  . primidone (MYSOLINE) 50 MG tablet Take 50 mg by mouth daily.   5  . traMADol (ULTRAM) 50 MG tablet Take 1 tablet (50 mg total) by mouth every 6 (six) hours as needed for moderate pain. (Patient taking differently: Take 50 mg by mouth every 4 (four) hours as needed for moderate pain or severe pain. ) 30 tablet 0   No facility-administered medications prior to visit.         Objective:   Physical Exam Vitals:   04/12/17 1414 04/12/17 1416  BP:  (!) 150/82  Pulse:  80  SpO2:  96%  Weight: 208 lb (94.3 kg)   Height: 5\' 5"  (1.651 m)    Gen: Pleasant, well-nourished, in no distress,  normal affect  ENT: No lesions,  mouth clear,  oropharynx clear, no postnasal drip  Neck: No JVD, no  Stridor  Lungs: No use of accessory muscles, Good air movement, no wheezing or crackles, no basilar dullness  Cardiovascular: RRR, heart sounds normal, no murmur or gallops, no peripheral edema  Musculoskeletal: No deformities, no cyanosis or clubbing  Neuro: alert, non focal  Skin: Warm, no lesions or rashes      Assessment & Plan:  Dyspnea Exertional dyspnea, etiology unclear and she had normal airflows, normal pulmonary function 06/12/14. Need to rule out reaccumulation of pleural fluid. Check chest x-ray today.  We will repeat her pulmonary function testing to compare with her prior. If any evidence of obstructive lung disease then we will initiate bronchodilators. She has an echocardiogram ordered, await that result. There could be a significant component of deconditioning here. Her dyspnea started postop CABG, she has also been limited more recently by back arthritis and a back surgery.   Your walking oximetry today did not show any low oxygen levels.  We will repeat your pulmonary function testing to compare to 2015 We will perform a chest x-ray today We will follow your echocardiogram results Follow with Dr Lamonte Sakai next available with full PFT same day.   Baltazar Apo, MD, PhD 04/12/2017, 3:11 PM [facility] [phone] or if no answer [phone]

## 2017-04-18 ENCOUNTER — Ambulatory Visit (INDEPENDENT_AMBULATORY_CARE_PROVIDER_SITE_OTHER): Payer: Medicare Other

## 2017-04-18 ENCOUNTER — Other Ambulatory Visit: Payer: Self-pay

## 2017-04-18 DIAGNOSIS — R0602 Shortness of breath: Secondary | ICD-10-CM | POA: Diagnosis not present

## 2017-04-19 ENCOUNTER — Telehealth: Payer: Self-pay

## 2017-04-19 NOTE — Telephone Encounter (Signed)
LMTCB

## 2017-04-19 NOTE — Telephone Encounter (Signed)
-----   Message from Merlene Laughter, LPN sent at 25/83/4621  3:21 PM EDT -----   ----- Message ----- From: Satira Sark, MD Sent: 04/18/2017   2:36 PM To: Merlene Laughter, LPN  Results reviewed. Study shows stable LVEF at 65-70% with mild diastolic dysfunction. Overall reassuring. A copy of this test should be forwarded to Allie Dimmer, MD.

## 2017-04-20 NOTE — Telephone Encounter (Signed)
Patient notified. Routed to PCP 

## 2017-05-30 ENCOUNTER — Encounter: Payer: Self-pay | Admitting: *Deleted

## 2017-05-30 ENCOUNTER — Ambulatory Visit (INDEPENDENT_AMBULATORY_CARE_PROVIDER_SITE_OTHER): Payer: Medicare Other | Admitting: Emergency Medicine

## 2017-05-30 ENCOUNTER — Encounter: Payer: Self-pay | Admitting: Emergency Medicine

## 2017-05-30 DIAGNOSIS — R0609 Other forms of dyspnea: Secondary | ICD-10-CM

## 2017-05-30 DIAGNOSIS — I2510 Atherosclerotic heart disease of native coronary artery without angina pectoris: Secondary | ICD-10-CM | POA: Diagnosis not present

## 2017-05-30 LAB — PULMONARY FUNCTION TEST
DL/VA % pred: 94 %
DL/VA: 4.66 ml/min/mmHg/L
DLCO cor % pred: 64 %
DLCO cor: 16.57 ml/min/mmHg
DLCO unc % pred: 62 %
DLCO unc: 16.09 ml/min/mmHg
FEF 25-75 Post: 1.85 L/sec
FEF 25-75 Pre: 1.38 L/sec
FEF2575-%Change-Post: 34 %
FEF2575-%Pred-Post: 105 %
FEF2575-%Pred-Pre: 78 %
FEV1-%Change-Post: 5 %
FEV1-%Pred-Post: 83 %
FEV1-%Pred-Pre: 78 %
FEV1-Post: 1.86 L
FEV1-Pre: 1.76 L
FEV1FVC-%Change-Post: 4 %
FEV1FVC-%Pred-Pre: 102 %
FEV6-%Change-Post: 0 %
FEV6-%Pred-Post: 81 %
FEV6-%Pred-Pre: 81 %
FEV6-Post: 2.31 L
FEV6-Pre: 2.29 L
FEV6FVC-%Pred-Post: 105 %
FEV6FVC-%Pred-Pre: 105 %
FVC-%Change-Post: 0 %
FVC-%Pred-Post: 77 %
FVC-%Pred-Pre: 77 %
FVC-Post: 2.31 L
FVC-Pre: 2.29 L
Post FEV1/FVC ratio: 80 %
Post FEV6/FVC ratio: 100 %
Pre FEV1/FVC ratio: 77 %
Pre FEV6/FVC Ratio: 100 %
RV % pred: 73 %
RV: 1.71 L
TLC % pred: 75 %
TLC: 3.9 L

## 2017-05-30 MED ORDER — TIOTROPIUM BROMIDE-OLODATEROL 2.5-2.5 MCG/ACT IN AERS: 2.0000 | INHALATION_SPRAY | Freq: Every day | RESPIRATORY_TRACT | 0 refills | Status: DC

## 2017-05-30 NOTE — Progress Notes (Signed)
Subjective:    Patient ID: Joyce [name], female    DOB: March 11, 1941, 76 y.o.   MRN: [id number]   Shortness of Breath  Pertinent negatives include no ear pain, fever, headaches, leg swelling, rash, rhinorrhea, sore throat, vomiting or wheezing.   76 year old never smoker, history of coronary disease/CABG, diabetes, hypertension, talc and bilateral Pleurx for pleural effusion following his cardiac surgery. Underwent spine surgery in the last year that limits her functional capacity to some degree, done in 09/2016. She is followed by Dr. Domenic Polite with cardiology in [place]. She's managed medically without any clear evidence for angina or active coronary disease. She is referred today for eval of exertional SOB that has been present since 2015. She is able to walk at a slow pace, has to stop after about 30 minutes. Quite limited on flat ground at rapid pace. Humidity causes her to be SOB as well, even while still. She is having exertional chest tightness, no wheeze or stridor. Her exercise is limited, due to back and breathing. She has a dry cough, non-productive cough, wakes her at night. Walking oximetry today on room air reassuring although she did have a short walk distance.  She had PFT 06/12/14 that I have reviewed > normal airflows, normal volumes and DLCO.   ROV 05/30/17 --this is a follow-up visit for progressive exertional shortness of breath.  We performed pulmonary function testing today that I have reviewed.  This shows evidence for restriction and probably some superimposed obstruction without a bronchodilator response.  Lung volumes are restricted her diffusion capacity is decreased but corrects to the normal range when adjusted for alveolar volume.  An echocardiogram was done on 04/18/17 that showed stable left ventricular ejection fraction 95-62%, mild diastolic dysfunction without significant change.  Chest x-ray done here 04/12/17 was reviewed and showed mild hyperinflation and bibasilar  pleural thickening without any significant infiltrates or pulmonary edema.    Review of Systems  Constitutional: Negative for fever and unexpected weight change.  HENT: Positive for congestion. Negative for dental problem, ear pain, nosebleeds, postnasal drip, rhinorrhea, sinus pressure, sneezing, sore throat and trouble swallowing.   Eyes: Negative for redness and itching.  Respiratory: Positive for cough and shortness of breath. Negative for chest tightness and wheezing.   Cardiovascular: Negative for palpitations and leg swelling.  Gastrointestinal: Negative for nausea and vomiting.  Genitourinary: Negative for dysuria.  Musculoskeletal: Negative for joint swelling.  Skin: Negative for rash.  Neurological: Negative for headaches.  Hematological: Does not bruise/bleed easily.  Psychiatric/Behavioral: Negative for dysphoric mood. The patient is not nervous/anxious.     Past Medical History:  Diagnosis Date  . Anemia   . Arthritis   . Bilateral pleural effusion    Postoperative, status post Pleurx catheter and talc treatment - Dr. Prescott Gum  . 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 Chronic kidney disease (CKD), stage III (moderate) (HCC)   . Chronic lower back pain   . Essential hypertension   . Fibromyalgia   . History of pneumonia   . Hyperlipidemia   . Type II diabetes mellitus (HCC)      Family History  Problem Relation Age of Onset  . Stroke Father   . Diabetes Mother   . Hypertension Mother   . Alcoholism Brother   . Heart disease Sister   . Alcoholism Brother      Social  History   Socioeconomic History  . Marital status: Widowed    Spouse name: Not on file  . Number of children: Not on file  . Years of education: Not on file  . Highest education level: Not on file  Social Needs  . Financial resource strain: Not on file  . Food insecurity - worry: Not on file  . Food  insecurity - inability: Not on file  . Transportation needs - medical: Not on file  . Transportation needs - non-medical: Not on file  Occupational History  . Not on file  Tobacco Use  . Smoking status: Never Smoker  . Smokeless tobacco: Never Used  Substance and Sexual Activity  . Alcohol use: No    Alcohol/week: 0.0 oz  . Drug use: No  . Sexual activity: No  Other Topics Concern  . Not on file  Social History Narrative  . 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   Has lived [place]  Allergies  Allergen Reactions  . Carvedilol Other (See Comments)    confusion  . Codeine Other (See Comments)    confusion  . Metoprolol Palpitations     Outpatient Medications Prior to Visit  Medication Sig Dispense Refill  . ACCU-CHEK AVIVA PLUS test strip Inject 1 strip as directed 4 (four) times daily.  2  . amLODipine (NORVASC) 10 MG tablet Take 10 mg by mouth daily.   5  . aspirin EC 81 MG tablet Take 1 tablet (81 mg total) by mouth daily.    Marland Kitchen atorvastatin (LIPITOR) 40 MG tablet Take 1 tablet (40 mg total) by mouth daily at 6 PM.    . B Complex-C-Folic Acid (DIALYVITE PO) Take 1 tablet by mouth.    . Cholecalciferol (VITAMIN D3) 2000 units TABS Take by mouth daily.    . Ferrous Sulfate (IRON) 28 MG TABS Take 1 tablet by mouth daily.    . furosemide (LASIX) 20 MG tablet Take 20 mg by mouth.    Marland Kitchen GLUCERNA (GLUCERNA) LIQD Take 237 mLs by mouth daily.    . hydrALAZINE (APRESOLINE) 100 MG tablet Take 100 mg by mouth 3 (three) times daily.    . insulin glargine (LANTUS) 100 UNIT/ML injection Inject 10-40 Units into the skin at bedtime.     . insulin lispro (HUMALOG) 100 UNIT/ML KiwkPen Inject 0-12 Units into the skin 3 (three) times daily with meals.     . Insulin Syringe-Needle U-100 (INSULIN SYRINGE .5CC/30GX5/16") 30G X 5/16" 0.5 ML MISC Inject 0.5 mLs as directed daily.    . Insulin Syringe-Needle U-100 (INSULIN SYRINGE .5CC/30GX5/16") 30G X 5/16" 0.5 ML MISC  Inject 0.5 mLs as directed 3 (three) times daily.  3  . lisinopril (PRINIVIL,ZESTRIL) 5 MG tablet Take 5 mg by mouth daily.     Marland Kitchen MISC NATURAL PRODUCT OP Take by mouth daily. TerraZyme    . MISC NATURAL PRODUCTS PO Take by mouth daily. EO Mega    . MISC NATURAL PRODUCTS PO Take by mouth daily. Alpha CRS    . MISC NATURAL PRODUCTS PO Take by mouth daily. Micro Plex VM    . MISC NATURAL PRODUCTS PO Take by mouth daily. Deep Blue Polyphenol Complex    . polyethylene glycol (MIRALAX / GLYCOLAX) packet Take 17 g by mouth daily as needed for mild constipation. 14 each 2  . primidone (MYSOLINE) 50 MG tablet Take 50 mg by mouth daily.   5  . traMADol (ULTRAM) 50 MG  tablet Take 1 tablet (50 mg total) by mouth every 6 (six) hours as needed for moderate pain. (Patient taking differently: Take 50 mg by mouth every 4 (four) hours as needed for moderate pain or severe pain. ) 30 tablet 0  . vitamin C (ASCORBIC ACID) 500 MG tablet Take 500 mg by mouth daily.     No facility-administered medications prior to visit.         Objective:   Physical Exam Vitals:   05/30/17 1552 05/30/17 1559  BP:  (!) 150/70  Pulse:  89  SpO2:  95%  Weight: 208 lb (94.3 kg)   Height: 5\' 5"  (1.651 m)    Gen: Pleasant, well-nourished, in no distress,  normal affect  ENT: No lesions,  mouth clear,  oropharynx clear, no postnasal drip  Neck: No JVD, no Stridor  Lungs: No use of accessory muscles, Good air movement, no wheezing or crackles, no basilar dullness  Cardiovascular: RRR, heart sounds normal, no murmur or gallops, no peripheral edema  Musculoskeletal: No deformities, no cyanosis or clubbing  Neuro: alert, non focal  Skin: Warm, no lesions or rashes      Assessment & Plan:  Dyspnea Multi-Factorial with evidence for obstruction likely due to fixed asthma in a never smoker, also restriction likely due to body habitus and to her history of talc pleurodeses.  We will try starting Stiolto to see if she  benefits.  Also talked with her about increasing her exercise, walking slowly and carefully.  She does have some limitations on her activity due to back surgery.  Follow-up in 6-8 weeks to assess her progress  Baltazar Apo, MD, PhD 05/30/2017, 4:31 PM [facility] [phone] or if no answer [phone]

## 2017-05-30 NOTE — Patient Instructions (Signed)
We will start Stiolto 2 puffs once a day for the next month.  Please keep track of whether you believe this medication is helpful.  If so then we will likely plan to continue it Slowly and carefully start to work on increasing your walking and your exercise tolerance. Follow with Dr Lamonte Sakai 6-8 weeks to assess your status on the medication.

## 2017-05-30 NOTE — Assessment & Plan Note (Signed)
Multi-Factorial with evidence for obstruction likely due to fixed asthma in a never smoker, also restriction likely due to body habitus and to her history of talc pleurodeses.  We will try starting Stiolto to see if she benefits.  Also talked with her about increasing her exercise, walking slowly and carefully.  She does have some limitations on her activity due to back surgery.  Follow-up in 6-8 weeks to assess her progress

## 2017-05-30 NOTE — Progress Notes (Signed)
PFT done today. 

## 2017-06-28 ENCOUNTER — Telehealth: Payer: Self-pay | Admitting: Emergency Medicine

## 2017-06-28 MED ORDER — TIOTROPIUM BROMIDE-OLODATEROL 2.5-2.5 MCG/ACT IN AERS: 2.0000 | INHALATION_SPRAY | Freq: Every day | RESPIRATORY_TRACT | 3 refills | Status: DC

## 2017-06-28 NOTE — Telephone Encounter (Signed)
Called and spoke with pt. Pt states that Stiolto is effective and request Rx. Rx has been sent to preferred pharmacy. Nothing further is needed.

## 2017-07-17 ENCOUNTER — Ambulatory Visit (INDEPENDENT_AMBULATORY_CARE_PROVIDER_SITE_OTHER): Payer: Medicare Other | Admitting: Emergency Medicine

## 2017-07-17 ENCOUNTER — Encounter: Payer: Self-pay | Admitting: *Deleted

## 2017-07-17 ENCOUNTER — Encounter: Payer: Self-pay | Admitting: Emergency Medicine

## 2017-07-17 DIAGNOSIS — R0609 Other forms of dyspnea: Secondary | ICD-10-CM

## 2017-07-17 DIAGNOSIS — J449 Chronic obstructive pulmonary disease, unspecified: Secondary | ICD-10-CM

## 2017-07-17 MED ORDER — ALBUTEROL SULFATE HFA 108 (90 BASE) MCG/ACT IN AERS: 2.0000 | INHALATION_SPRAY | RESPIRATORY_TRACT | 5 refills | Status: DC | PRN

## 2017-07-17 NOTE — Assessment & Plan Note (Signed)
Suspect fixed asthma given never smoking status.  She benefited from [employer].  We will continue this and teach her how to use an albuterol HFA.  Follow in 1 year or sooner if needed

## 2017-07-17 NOTE — Progress Notes (Signed)
Subjective:    Patient ID: Joyce [name], female    DOB: 07-19-1940, 77 y.o.   MRN: [id number]   Shortness of Breath  Pertinent negatives include no ear pain, fever, headaches, leg swelling, rash, rhinorrhea, sore throat, vomiting or wheezing.   77 year old never smoker, history of coronary disease/CABG, diabetes, hypertension, talc and bilateral Pleurx for pleural effusion following his cardiac surgery. Underwent spine surgery in the last year that limits her functional capacity to some degree, done in 09/2016. She is followed by Dr. Domenic Polite with cardiology in [place]. She's managed medically without any clear evidence for angina or active coronary disease. She is referred today for eval of exertional SOB that has been present since 2015. She is able to walk at a slow pace, has to stop after about 30 minutes. Quite limited on flat ground at rapid pace. Humidity causes her to be SOB as well, even while still. She is having exertional chest tightness, no wheeze or stridor. Her exercise is limited, due to back and breathing. She has a dry cough, non-productive cough, wakes her at night. Walking oximetry today on room air reassuring although she did have a short walk distance.  She had PFT 06/12/14 that I have reviewed > normal airflows, normal volumes and DLCO.   ROV 05/30/17 --this is a follow-up visit for progressive exertional shortness of breath.  We performed pulmonary function testing today that I have reviewed.  This shows evidence for restriction and probably some superimposed obstruction without a bronchodilator response.  Lung volumes are restricted her diffusion capacity is decreased but corrects to the normal range when adjusted for alveolar volume.  An echocardiogram was done on 04/18/17 that showed stable left ventricular ejection fraction 67-61%, mild diastolic dysfunction without significant change.  Chest x-ray done here 04/12/17 was reviewed and showed mild hyperinflation and bibasilar  pleural thickening without any significant infiltrates or pulmonary edema.  ROV 07/17/17 --history of coronary disease, CABG, diabetes, hypertension.  This is a follow-up visit for exertional dyspnea in the setting combined restriction and obstruction.  Also some bibasilar pleural thickening status post pleural effusions and pleurodesis following CABG. She believes that her breathing as been better since starting Stiolto - tolerating more exertion. No cough, no sore throat.    Review of Systems  Constitutional: Negative for fever and unexpected weight change.  HENT: Negative for congestion, dental problem, ear pain, nosebleeds, postnasal drip, rhinorrhea, sinus pressure, sneezing, sore throat and trouble swallowing.   Eyes: Negative for redness and itching.  Respiratory: Negative for cough, chest tightness, shortness of breath and wheezing.   Cardiovascular: Negative for palpitations and leg swelling.  Gastrointestinal: Negative for nausea and vomiting.  Genitourinary: Negative for dysuria.  Musculoskeletal: Negative for joint swelling.  Skin: Negative for rash.  Neurological: Negative for headaches.  Hematological: Does not bruise/bleed easily.  Psychiatric/Behavioral: Negative for dysphoric mood. The patient is not nervous/anxious.      Past Medical History:  Diagnosis Date  . Anemia   . Arthritis   . Bilateral pleural effusion    Postoperative, status post Pleurx catheter and talc treatment - Dr. Prescott Gum  . 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 Chronic kidney disease (CKD), stage III (moderate) (HCC)   . Chronic lower back pain   . Essential hypertension   . Fibromyalgia   . History of pneumonia   . Hyperlipidemia   .  Type II diabetes mellitus (HCC)      Family History  Problem Relation Age of Onset  . Stroke Father   . Diabetes Mother   . Hypertension Mother   . Alcoholism Brother    . Heart disease Sister   . Alcoholism Brother      Social History   Socioeconomic History  . Marital status: Widowed    Spouse name: Not on file  . Number of children: Not on file  . Years of education: Not on file  . Highest education level: Not on file  Social Needs  . Financial resource strain: Not on file  . Food insecurity - worry: Not on file  . Food insecurity - inability: Not on file  . Transportation needs - medical: Not on file  . Transportation needs - non-medical: Not on file  Occupational History  . Not on file  Tobacco Use  . Smoking status: Never Smoker  . Smokeless tobacco: Never Used  Substance and Sexual Activity  . Alcohol use: No    Alcohol/week: 0.0 oz  . Drug use: No  . Sexual activity: No  Other Topics Concern  . Not on file  Social History Narrative  . 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   Has lived [place]  Allergies  Allergen Reactions  . Carvedilol Other (See Comments)    confusion  . Codeine Other (See Comments)    confusion  . Metoprolol Palpitations     Outpatient Medications Prior to Visit  Medication Sig Dispense Refill  . ACCU-CHEK AVIVA PLUS test strip Inject 1 strip as directed 4 (four) times daily.  2  . amLODipine (NORVASC) 10 MG tablet Take 10 mg by mouth daily.   5  . aspirin EC 81 MG tablet Take 1 tablet (81 mg total) by mouth daily.    Marland Kitchen atorvastatin (LIPITOR) 40 MG tablet Take 1 tablet (40 mg total) by mouth daily at 6 PM.    . B Complex-C-Folic Acid (DIALYVITE PO) Take 1 tablet by mouth.    . Cholecalciferol (VITAMIN D3) 2000 units TABS Take by mouth daily.    . Ferrous Sulfate (IRON) 28 MG TABS Take 1 tablet by mouth daily.    . furosemide (LASIX) 20 MG tablet Take 20 mg by mouth.    Marland Kitchen GLUCERNA (GLUCERNA) LIQD Take 237 mLs by mouth daily.    . hydrALAZINE (APRESOLINE) 100 MG tablet Take 100 mg by mouth 3 (three) times daily.    . insulin glargine (LANTUS) 100 UNIT/ML injection Inject  10-40 Units into the skin at bedtime.     . insulin lispro (HUMALOG) 100 UNIT/ML KiwkPen Inject 0-12 Units into the skin 3 (three) times daily with meals.     . Insulin Syringe-Needle U-100 (INSULIN SYRINGE .5CC/30GX5/16") 30G X 5/16" 0.5 ML MISC Inject 0.5 mLs as directed daily.    . Insulin Syringe-Needle U-100 (INSULIN SYRINGE .5CC/30GX5/16") 30G X 5/16" 0.5 ML MISC Inject 0.5 mLs as directed 3 (three) times daily.  3  . lisinopril (PRINIVIL,ZESTRIL) 5 MG tablet Take 5 mg by mouth daily.     Marland Kitchen MISC NATURAL PRODUCT OP Take by mouth daily. TerraZyme    . MISC NATURAL PRODUCTS PO Take by mouth daily. EO Mega    . MISC NATURAL PRODUCTS PO Take by mouth daily. Alpha CRS    . MISC NATURAL PRODUCTS PO Take by mouth daily. Micro Plex VM    . MISC NATURAL PRODUCTS PO  Take by mouth daily. Deep Blue Polyphenol Complex    . polyethylene glycol (MIRALAX / GLYCOLAX) packet Take 17 g by mouth daily as needed for mild constipation. 14 each 2  . primidone (MYSOLINE) 50 MG tablet Take 50 mg by mouth daily.   5  . Tiotropium Bromide-Olodaterol (STIOLTO RESPIMAT) 2.5-2.5 MCG/ACT AERS Inhale 2 puffs into the lungs daily. 1 Inhaler 3  . traMADol (ULTRAM) 50 MG tablet Take 1 tablet (50 mg total) by mouth every 6 (six) hours as needed for moderate pain. (Patient taking differently: Take 50 mg by mouth every 4 (four) hours as needed for moderate pain or severe pain. ) 30 tablet 0  . vitamin C (ASCORBIC ACID) 500 MG tablet Take 500 mg by mouth daily.     No facility-administered medications prior to visit.         Objective:   Physical Exam Vitals:   07/17/17 1435  BP: (!) 160/80  Pulse: 68  SpO2: 98%  Weight: 207 lb 6.4 oz (94.1 kg)  Height: 5\' 5"  (1.651 m)   Gen: Pleasant, well-nourished, in no distress,  normal affect  ENT: No lesions,  mouth clear,  oropharynx clear, no postnasal drip  Neck: No JVD, no Stridor  Lungs: No use of accessory muscles, Good air movement, no wheezing or crackles, no  basilar dullness  Cardiovascular: RRR, heart sounds normal, no murmur or gallops, no peripheral edema  Musculoskeletal: No deformities, no cyanosis or clubbing  Neuro: alert, non focal  Skin: Warm, no lesions or rashes      Assessment & Plan:  Dyspnea Due to mixed obstruction and restriction.  She benefited from the initiation of Stiolto.  Suspect some degree of fixed asthma.  She needs to learn how to use an albuterol inhaler and we will teach her today.  Follow up 1 year  COPD with asthma ([place]) Suspect fixed asthma given never smoking status.  She benefited from [employer].  We will continue this and teach her how to use an albuterol HFA.  Follow in 1 year or sooner if needed  Baltazar Apo, MD, PhD 07/17/2017, 3:02 PM [facility] [phone] or if no answer [phone]

## 2017-07-17 NOTE — Assessment & Plan Note (Signed)
Due to mixed obstruction and restriction.  She benefited from the initiation of Stiolto.  Suspect some degree of fixed asthma.  She needs to learn how to use an albuterol inhaler and we will teach her today.  Follow up 1 year

## 2017-07-17 NOTE — Patient Instructions (Addendum)
Please continue Stiolto 2 puffs once a day. We will show he had to use an albuterol inhaler today.  Please keep this available to use 2 puffs if needed for shortness of breath, wheezing, chest tightness. Follow with Dr Lamonte Sakai in 12 months or sooner if you have any problems

## 2017-10-02 NOTE — Progress Notes (Signed)
Cardiology Office Note  Date: 10/05/2017   ID: KESLEY MULLENS, DOB 18-Feb-1941, MRN [id number]  PCP: Allie Dimmer, MD  Primary Cardiologist: Rozann Lesches, MD   Chief Complaint  Patient presents with  . Coronary Artery Disease    History of Present Illness: Joyce [name] is a 77 y.o. female last seen in October 2018.  She presents for a follow-up visit.  States that she has had no angina symptoms.  Recent head congestion in the pollen season.  No progressive wheezing or cough however.  I reviewed her most recent lab work from Dr. Jenean Lindau, outlined below.  Updated echocardiogram from October 2018 showed stable LVEF at 65-70% with mild diastolic dysfunction.  She is now following with Dr. Lamonte Sakai in the Pulmonary clinic.  I reviewed the note from January.  I personally reviewed her ECG today which shows normal sinus rhythm with lead motion artifact and nonspecific ST-T changes.  Current cardiac medications include aspirin, Norvasc, Lipitor, Lasix, hydralazine, and lisinopril.  Past Medical History:  Diagnosis Date  . Anemia   . Arthritis   . Bilateral pleural effusion    Postoperative, status post Pleurx catheter and talc treatment - Dr. Prescott Gum  . 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 Chronic kidney disease (CKD), stage III (moderate) (HCC)   . Chronic lower back pain   . Essential hypertension   . Fibromyalgia   . History of pneumonia   . Hyperlipidemia   . Type II diabetes mellitus ([place])     Past Surgical History:  Procedure Laterality Date  . ABDOMINAL HYSTERECTOMY  1980's  . ANTERIOR CERVICAL DECOMP/DISCECTOMY FUSION  2000's  . APPENDECTOMY  1970's  . BACK SURGERY    . CHEST TUBE INSERTION Left 08/07/2014   Procedure: INSERTION PLEURAL DRAINAGE CATHETER;  Surgeon: Ivin Poot, MD;  Location: [facility];  Service: Thoracic;  Laterality: Left;  . CHEST TUBE  INSERTION Right 08/12/2014   Procedure: INSERTION PLEURAL DRAINAGE CATHETER;  Surgeon: Ivin Poot, MD;  Location: [facility];  Service: Thoracic;  Laterality: Right;  . CHOLECYSTECTOMY  ~ 2005  . CORONARY ARTERY BYPASS GRAFT N/A 06/15/2014   Procedure: CORONARY ARTERY BYPASS GRAFTING (CABG);  Surgeon: Ivin Poot, MD;  Location: [facility];  Service: Open Heart Surgery;  Laterality: N/A;  . INTRAOPERATIVE TRANSESOPHAGEAL ECHOCARDIOGRAM N/A 06/15/2014   Procedure: INTRAOPERATIVE TRANSESOPHAGEAL ECHOCARDIOGRAM;  Surgeon: Ivin Poot, MD;  Location: [facility];  Service: Open Heart Surgery;  Laterality: N/A;  . LEFT HEART CATHETERIZATION WITH CORONARY ANGIOGRAM N/A 06/11/2014   Procedure: LEFT HEART CATHETERIZATION WITH CORONARY ANGIOGRAM;  Surgeon: Blane Ohara, MD;  Location: [facility] CATH LAB;  Service: Cardiovascular;  Laterality: N/A;  . LUMBAR DISC SURGERY  1980's X 1; 1990's X  2000's X 1  . REMOVAL OF PLEURAL DRAINAGE CATHETER Bilateral 02/11/2015   Procedure: REMOVAL OF PLEURAL DRAINAGE CATHETER;  Surgeon: Ivin Poot, MD;  Location: [facility];  Service: Thoracic;  Laterality: Bilateral;  . TALC PLEURODESIS Bilateral 02/11/2015   Procedure: Pietro Cassis;  Surgeon: Ivin Poot, MD;  Location: [facility];  Service: Thoracic;  Laterality: Bilateral;  . TONSILLECTOMY  ~ 1950    Current Outpatient Medications  Medication Sig Dispense Refill  . ACCU-CHEK AVIVA PLUS test strip Inject 1 strip as directed 4 (four) times daily.  2  . albuterol (PROVENTIL HFA;VENTOLIN HFA) 108 (  90 Base) MCG/ACT inhaler Inhale 2 puffs into the lungs every 4 (four) hours as needed for wheezing or shortness of breath. 1 Inhaler 5  . amLODipine (NORVASC) 10 MG tablet Take 10 mg by mouth daily.   5  . aspirin EC 81 MG tablet Take 1 tablet (81 mg total) by mouth daily.    Marland Kitchen atorvastatin (LIPITOR) 40 MG tablet Take 1 tablet (40 mg total) by mouth daily at 6 PM.    . B Complex-C-Folic Acid (DIALYVITE PO) Take 1 tablet by  mouth.    . Cholecalciferol (VITAMIN D3) 2000 units TABS Take by mouth daily.    . Ferrous Sulfate (IRON) 28 MG TABS Take 1 tablet by mouth daily.    . furosemide (LASIX) 20 MG tablet Take 20 mg by mouth.    Marland Kitchen GLUCERNA (GLUCERNA) LIQD Take 237 mLs by mouth daily.    . hydrALAZINE (APRESOLINE) 100 MG tablet Take 100 mg by mouth 3 (three) times daily.    . insulin glargine (LANTUS) 100 UNIT/ML injection Inject 10-40 Units into the skin at bedtime.     . insulin lispro (HUMALOG) 100 UNIT/ML KiwkPen Inject 0-12 Units into the skin 3 (three) times daily with meals.     . Insulin Syringe-Needle U-100 (INSULIN SYRINGE .5CC/30GX5/16") 30G X 5/16" 0.5 ML MISC Inject 0.5 mLs as directed daily.    . Insulin Syringe-Needle U-100 (INSULIN SYRINGE .5CC/30GX5/16") 30G X 5/16" 0.5 ML MISC Inject 0.5 mLs as directed 3 (three) times daily.  3  . lisinopril (PRINIVIL,ZESTRIL) 5 MG tablet Take 5 mg by mouth daily.     Marland Kitchen MISC NATURAL PRODUCT OP Take by mouth daily. TerraZyme    . MISC NATURAL PRODUCTS PO Take by mouth daily. EO Mega    . MISC NATURAL PRODUCTS PO Take by mouth daily. Alpha CRS    . MISC NATURAL PRODUCTS PO Take by mouth daily. Micro Plex VM    . MISC NATURAL PRODUCTS PO Take by mouth daily. Deep Blue Polyphenol Complex    . polyethylene glycol (MIRALAX / GLYCOLAX) packet Take 17 g by mouth daily as needed for mild constipation. 14 each 2  . primidone (MYSOLINE) 50 MG tablet Take 50 mg by mouth daily.   5  . Tiotropium Bromide-Olodaterol (STIOLTO RESPIMAT) 2.5-2.5 MCG/ACT AERS Inhale 2 puffs into the lungs daily. 1 Inhaler 3  . traMADol (ULTRAM) 50 MG tablet Take 1 tablet (50 mg total) by mouth every 6 (six) hours as needed for moderate pain. (Patient taking differently: Take 50 mg by mouth every 4 (four) hours as needed for moderate pain or severe pain. ) 30 tablet 0  . vitamin C (ASCORBIC ACID) 500 MG tablet Take 500 mg by mouth daily.     No current facility-administered medications for this  visit.    Allergies:  Carvedilol; Codeine; and Metoprolol   Social History: The patient  reports that she has never smoked. She has never used smokeless tobacco. She reports that she does not drink alcohol or use drugs.   ROS:  Please see the history of present illness. Otherwise, complete review of systems is positive for right hip pain.  All other systems are reviewed and negative.   Physical Exam: VS:  BP (!) 165/64   Pulse 75   Ht 5\' 5"  (1.651 m)   Wt 205 lb (93 kg)   SpO2 97%   BMI 34.11 kg/m , BMI Body mass index is 34.11 kg/m.  Wt Readings from Last 3 Encounters:  10/05/17 205 lb (93 kg)  07/17/17 207 lb 6.4 oz (94.1 kg)  05/30/17 208 lb (94.3 kg)    General: Patient appears comfortable at rest. HEENT: Conjunctiva and lids normal, oropharynx clear with moist mucosa. Neck: Supple, no elevated JVP or carotid bruits, no thyromegaly. Lungs: Coarse breath sounds without wheezing, nonlabored breathing at rest. Cardiac: Regular rate and rhythm, no S3, 2/6 systolic murmur. Abdomen: Soft, nontender, bowel sounds present. Extremities: No pitting edema, distal pulses 2+. Skin: Warm and dry. Musculoskeletal: No kyphosis. Neuropsychiatric: Alert and oriented x3, affect grossly appropriate.  ECG: I personally reviewed the tracing from 07/11/2016 which showed sinus rhythm with nonspecific T wave changes.  Recent Labwork:  January 2019: Hemoglobin 10.6, platelets 139, potassium 5.3, BUN 30, creatinine 1.68, hemoglobin A1c 6.2, AST 19, ALT 13, cholesterol 148, triglycerides 137, HDL 39, LDL 82  Other Studies Reviewed Today:  Echocardiogram 04/18/2017: Study Conclusions  - Left ventricle: The cavity size was normal. Wall thickness was   increased in a pattern of mild LVH. Systolic function was   vigorous. The estimated ejection fraction was in the range of 65%   to 70%. Doppler parameters are consistent with abnormal left   ventricular relaxation (grade 1 diastolic  dysfunction). - Aortic valve: Mildly calcified annulus. Normal thickness   leaflets. Valve area (VTI): 4.05 cm^2. Valve area (Vmax): 2.74   cm^2. Valve area (Vmean): 2.77 cm^2. - Atrial septum: No defect or patent foramen ovale was identified. - Systemic veins: IVC is small, suggesting low RA pressure and   hypovolemia. - Technically difficult study.  Assessment and Plan:  1.  Multivessel CAD status post CABG in December 2015.  She remains clinically stable without active angina and normal LVEF by follow-up echocardiogram.  No changes made to current regimen.  ECG reviewed.  2.  Asthma with mixed obstruction and restriction.  She continues to follow with Dr. Lamonte Sakai in the Pulmonary clinic.  3.  Mixed hyperlipidemia, continues on Lipitor.  Last LDL 82.  4.  CKD stage 3, creatinine 1.68.  Current medicines were reviewed with the patient today.   Orders Placed This Encounter  Procedures  . EKG 12-Lead    Disposition: Follow-up in 6 months.  Signed, Satira Sark, MD, [facility] 10/05/2017 2:42 PM    [facility] [place], [place], [place] [address] Phone: [phone]; Fax: [phone]

## 2017-10-05 ENCOUNTER — Ambulatory Visit (INDEPENDENT_AMBULATORY_CARE_PROVIDER_SITE_OTHER): Payer: Medicare Other | Admitting: Cardiology

## 2017-10-05 ENCOUNTER — Encounter: Payer: Self-pay | Admitting: Cardiology

## 2017-10-05 VITALS — BP 165/64 | HR 75 | Ht 65.0 in | Wt 205.0 lb

## 2017-10-05 DIAGNOSIS — N183 Chronic kidney disease, stage 3 unspecified: Secondary | ICD-10-CM

## 2017-10-05 DIAGNOSIS — E782 Mixed hyperlipidemia: Secondary | ICD-10-CM | POA: Diagnosis not present

## 2017-10-05 DIAGNOSIS — J45909 Unspecified asthma, uncomplicated: Secondary | ICD-10-CM | POA: Diagnosis not present

## 2017-10-05 DIAGNOSIS — I2510 Atherosclerotic heart disease of native coronary artery without angina pectoris: Secondary | ICD-10-CM | POA: Diagnosis not present

## 2017-10-05 NOTE — Patient Instructions (Signed)

## 2017-11-24 ENCOUNTER — Other Ambulatory Visit: Payer: Self-pay | Admitting: Emergency Medicine

## 2017-12-20 ENCOUNTER — Other Ambulatory Visit: Payer: Self-pay | Admitting: *Deleted

## 2017-12-20 MED ORDER — TIOTROPIUM BROMIDE-OLODATEROL 2.5-2.5 MCG/ACT IN AERS: 2.0000 | INHALATION_SPRAY | Freq: Every day | RESPIRATORY_TRACT | 5 refills | Status: DC

## 2018-04-11 ENCOUNTER — Telehealth: Payer: Self-pay | Admitting: Emergency Medicine

## 2018-04-11 NOTE — Telephone Encounter (Signed)
PA initiated for Stiolto Respimat 2.66mcg Aerosol today on [email or web address] KeyKatherine Basset - PA Case ID: [id number] - Rx #: [id number]   [employer] is reviewing your PA request.  Typically an electronic response will be received within 72 hours.  To check for an update later, open this request from your dashboard. You may close this dialog and return to your dashboard to perform other tasks.  Routing message to [place] to f/u with patient.

## 2018-04-12 ENCOUNTER — Other Ambulatory Visit: Payer: Self-pay

## 2018-04-12 NOTE — Telephone Encounter (Addendum)
Per CMM- PA for Stiolto is not needed, as medication is preferred. Spoke to [employer], who states Stiolto is covered and Rx has been picked up by pt.  Nothing further is needed.

## 2018-04-12 NOTE — Telephone Encounter (Signed)
Checked Cover My Meds for this PA determination. Received a message that this medication or product is on the pt's plan's list of covered drugs. Prior authorization is not required at this time.

## 2018-05-17 ENCOUNTER — Encounter (HOSPITAL_COMMUNITY): Payer: Self-pay | Admitting: Student

## 2018-05-17 ENCOUNTER — Ambulatory Visit: Payer: Medicare Other | Admitting: Cardiology

## 2018-05-17 ENCOUNTER — Inpatient Hospital Stay (HOSPITAL_COMMUNITY)
Admission: AD | Admit: 2018-05-17 | Discharge: 2018-05-26 | DRG: 246 | Disposition: A | Discharge status: Home / Self Care | Payer: Medicare Other | Source: Other Acute Inpatient Hospital | Attending: Cardiology | Admitting: Cardiology

## 2018-05-17 ENCOUNTER — Telehealth: Payer: Self-pay | Admitting: Cardiology

## 2018-05-17 DIAGNOSIS — Z833 Family history of diabetes mellitus: Secondary | ICD-10-CM

## 2018-05-17 DIAGNOSIS — Z9089 Acquired absence of other organs: Secondary | ICD-10-CM

## 2018-05-17 DIAGNOSIS — Z955 Presence of coronary angioplasty implant and graft: Secondary | ICD-10-CM

## 2018-05-17 DIAGNOSIS — I509 Heart failure, unspecified: Secondary | ICD-10-CM | POA: Diagnosis not present

## 2018-05-17 DIAGNOSIS — Z823 Family history of stroke: Secondary | ICD-10-CM | POA: Diagnosis not present

## 2018-05-17 DIAGNOSIS — E1122 Type 2 diabetes mellitus with diabetic chronic kidney disease: Secondary | ICD-10-CM | POA: Diagnosis not present

## 2018-05-17 DIAGNOSIS — N183 Chronic kidney disease, stage 3 (moderate): Secondary | ICD-10-CM | POA: Diagnosis present

## 2018-05-17 DIAGNOSIS — Z811 Family history of alcohol abuse and dependence: Secondary | ICD-10-CM

## 2018-05-17 DIAGNOSIS — M1990 Unspecified osteoarthritis, unspecified site: Secondary | ICD-10-CM | POA: Diagnosis present

## 2018-05-17 DIAGNOSIS — I25119 Atherosclerotic heart disease of native coronary artery with unspecified angina pectoris: Secondary | ICD-10-CM | POA: Diagnosis not present

## 2018-05-17 DIAGNOSIS — J449 Chronic obstructive pulmonary disease, unspecified: Secondary | ICD-10-CM | POA: Diagnosis not present

## 2018-05-17 DIAGNOSIS — I2510 Atherosclerotic heart disease of native coronary artery without angina pectoris: Secondary | ICD-10-CM | POA: Diagnosis not present

## 2018-05-17 DIAGNOSIS — Z8249 Family history of ischemic heart disease and other diseases of the circulatory system: Secondary | ICD-10-CM | POA: Diagnosis not present

## 2018-05-17 DIAGNOSIS — Z794 Long term (current) use of insulin: Secondary | ICD-10-CM | POA: Diagnosis not present

## 2018-05-17 DIAGNOSIS — I724 Aneurysm of artery of lower extremity: Secondary | ICD-10-CM | POA: Diagnosis not present

## 2018-05-17 DIAGNOSIS — Z951 Presence of aortocoronary bypass graft: Secondary | ICD-10-CM

## 2018-05-17 DIAGNOSIS — T148XXA Other injury of unspecified body region, initial encounter: Secondary | ICD-10-CM | POA: Diagnosis not present

## 2018-05-17 DIAGNOSIS — D649 Anemia, unspecified: Secondary | ICD-10-CM | POA: Diagnosis not present

## 2018-05-17 DIAGNOSIS — I25110 Atherosclerotic heart disease of native coronary artery with unstable angina pectoris: Secondary | ICD-10-CM | POA: Diagnosis not present

## 2018-05-17 DIAGNOSIS — I10 Essential (primary) hypertension: Secondary | ICD-10-CM | POA: Diagnosis present

## 2018-05-17 DIAGNOSIS — E1169 Type 2 diabetes mellitus with other specified complication: Secondary | ICD-10-CM | POA: Diagnosis not present

## 2018-05-17 DIAGNOSIS — D62 Acute posthemorrhagic anemia: Secondary | ICD-10-CM | POA: Diagnosis not present

## 2018-05-17 DIAGNOSIS — E785 Hyperlipidemia, unspecified: Secondary | ICD-10-CM | POA: Diagnosis present

## 2018-05-17 DIAGNOSIS — Z90710 Acquired absence of both cervix and uterus: Secondary | ICD-10-CM

## 2018-05-17 DIAGNOSIS — D500 Iron deficiency anemia secondary to blood loss (chronic): Secondary | ICD-10-CM | POA: Diagnosis not present

## 2018-05-17 DIAGNOSIS — M797 Fibromyalgia: Secondary | ICD-10-CM | POA: Diagnosis not present

## 2018-05-17 DIAGNOSIS — Z9049 Acquired absence of other specified parts of digestive tract: Secondary | ICD-10-CM

## 2018-05-17 DIAGNOSIS — Z79899 Other long term (current) drug therapy: Secondary | ICD-10-CM | POA: Diagnosis not present

## 2018-05-17 DIAGNOSIS — I130 Hypertensive heart and chronic kidney disease with heart failure and stage 1 through stage 4 chronic kidney disease, or unspecified chronic kidney disease: Secondary | ICD-10-CM | POA: Diagnosis present

## 2018-05-17 DIAGNOSIS — Z8701 Personal history of pneumonia (recurrent): Secondary | ICD-10-CM | POA: Diagnosis not present

## 2018-05-17 DIAGNOSIS — Z6833 Body mass index (BMI) 33.0-33.9, adult: Secondary | ICD-10-CM

## 2018-05-17 DIAGNOSIS — Z885 Allergy status to narcotic agent status: Secondary | ICD-10-CM

## 2018-05-17 DIAGNOSIS — E669 Obesity, unspecified: Secondary | ICD-10-CM | POA: Diagnosis not present

## 2018-05-17 DIAGNOSIS — I213 ST elevation (STEMI) myocardial infarction of unspecified site: Secondary | ICD-10-CM | POA: Diagnosis not present

## 2018-05-17 DIAGNOSIS — N185 Chronic kidney disease, stage 5: Secondary | ICD-10-CM | POA: Diagnosis present

## 2018-05-17 DIAGNOSIS — R079 Chest pain, unspecified: Secondary | ICD-10-CM | POA: Diagnosis not present

## 2018-05-17 DIAGNOSIS — Z7982 Long term (current) use of aspirin: Secondary | ICD-10-CM

## 2018-05-17 DIAGNOSIS — I214 Non-ST elevation (NSTEMI) myocardial infarction: Secondary | ICD-10-CM | POA: Diagnosis present

## 2018-05-17 DIAGNOSIS — Z888 Allergy status to other drugs, medicaments and biological substances status: Secondary | ICD-10-CM

## 2018-05-17 LAB — GLUCOSE, CAPILLARY
GLUCOSE-CAPILLARY: 73 mg/dL (ref 70–99)
Glucose-Capillary: 182 mg/dL — ABNORMAL HIGH (ref 70–99)

## 2018-05-17 LAB — TROPONIN I: Troponin I: 0.31 ng/mL (ref <0.03)

## 2018-05-17 LAB — PROTIME-INR
INR: 1.13
Prothrombin Time: 14.4 seconds (ref 11.4–15.2)

## 2018-05-17 MED ORDER — TRAMADOL HCL 50 MG PO TABS
50.0000 mg | ORAL_TABLET | Freq: Four times a day (QID) | ORAL | Status: DC | PRN
  Administered 2018-05-20 – 2018-05-22 (×3): 50 mg via ORAL
  Filled 2018-05-17 (×3): qty 1

## 2018-05-17 MED ORDER — HEPARIN BOLUS VIA INFUSION
4000.0000 [IU] | Freq: Once | INTRAVENOUS | Status: AC
  Administered 2018-05-17: 4000 [IU] via INTRAVENOUS
  Filled 2018-05-17: qty 4000

## 2018-05-17 MED ORDER — INSULIN ASPART 100 UNIT/ML ~~LOC~~ SOLN
0.0000 [IU] | Freq: Three times a day (TID) | SUBCUTANEOUS | Status: DC
  Administered 2018-05-18: 3 [IU] via SUBCUTANEOUS
  Administered 2018-05-18: 4 [IU] via SUBCUTANEOUS
  Administered 2018-05-19: 3 [IU] via SUBCUTANEOUS
  Administered 2018-05-19 – 2018-05-20 (×3): 4 [IU] via SUBCUTANEOUS
  Administered 2018-05-21 – 2018-05-23 (×2): 3 [IU] via SUBCUTANEOUS
  Administered 2018-05-24: 4 [IU] via SUBCUTANEOUS
  Administered 2018-05-24 – 2018-05-26 (×2): 3 [IU] via SUBCUTANEOUS

## 2018-05-17 MED ORDER — AMLODIPINE BESYLATE 10 MG PO TABS
10.0000 mg | ORAL_TABLET | Freq: Every day | ORAL | Status: DC
  Administered 2018-05-18 – 2018-05-26 (×9): 10 mg via ORAL
  Filled 2018-05-17 (×9): qty 1

## 2018-05-17 MED ORDER — ONDANSETRON HCL 4 MG/2ML IJ SOLN: 4.0000 mg | Freq: Four times a day (QID) | INTRAMUSCULAR | Status: DC | PRN

## 2018-05-17 MED ORDER — FERROUS SULFATE 325 (65 FE) MG PO TABS
325.0000 mg | ORAL_TABLET | Freq: Every day | ORAL | Status: DC
  Administered 2018-05-18 – 2018-05-26 (×9): 325 mg via ORAL
  Filled 2018-05-17 (×9): qty 1

## 2018-05-17 MED ORDER — TICAGRELOR 90 MG PO TABS
90.0000 mg | ORAL_TABLET | Freq: Two times a day (BID) | ORAL | Status: DC
  Administered 2018-05-18 – 2018-05-20 (×5): 90 mg via ORAL
  Filled 2018-05-17 (×5): qty 1

## 2018-05-17 MED ORDER — TICAGRELOR 90 MG PO TABS
180.0000 mg | ORAL_TABLET | Freq: Once | ORAL | Status: AC
  Administered 2018-05-17: 180 mg via ORAL
  Filled 2018-05-17: qty 2

## 2018-05-17 MED ORDER — HYDRALAZINE HCL 50 MG PO TABS
100.0000 mg | ORAL_TABLET | Freq: Three times a day (TID) | ORAL | Status: DC
  Administered 2018-05-17 – 2018-05-26 (×25): 100 mg via ORAL
  Filled 2018-05-17 (×25): qty 2

## 2018-05-17 MED ORDER — ACETAMINOPHEN 325 MG PO TABS: 650.0000 mg | ORAL_TABLET | ORAL | Status: DC | PRN

## 2018-05-17 MED ORDER — ASPIRIN EC 81 MG PO TBEC
81.0000 mg | DELAYED_RELEASE_TABLET | Freq: Every day | ORAL | Status: DC
  Administered 2018-05-18 – 2018-05-19 (×2): 81 mg via ORAL
  Filled 2018-05-17 (×3): qty 1

## 2018-05-17 MED ORDER — INSULIN GLARGINE 100 UNIT/ML ~~LOC~~ SOLN
80.0000 [IU] | Freq: Every day | SUBCUTANEOUS | Status: DC
  Administered 2018-05-18: 80 [IU] via SUBCUTANEOUS
  Administered 2018-05-19: 40 [IU] via SUBCUTANEOUS
  Administered 2018-05-20 – 2018-05-21 (×2): 80 [IU] via SUBCUTANEOUS
  Filled 2018-05-17 (×9): qty 0.8

## 2018-05-17 MED ORDER — ALBUTEROL SULFATE (2.5 MG/3ML) 0.083% IN NEBU: 2.5000 mg | INHALATION_SOLUTION | RESPIRATORY_TRACT | Status: DC | PRN

## 2018-05-17 MED ORDER — LISINOPRIL 10 MG PO TABS
10.0000 mg | ORAL_TABLET | Freq: Every day | ORAL | Status: DC
  Administered 2018-05-17 – 2018-05-20 (×4): 10 mg via ORAL
  Filled 2018-05-17 (×4): qty 1

## 2018-05-17 MED ORDER — ARFORMOTEROL TARTRATE 15 MCG/2ML IN NEBU
15.0000 ug | INHALATION_SOLUTION | Freq: Two times a day (BID) | RESPIRATORY_TRACT | Status: DC
  Administered 2018-05-17 – 2018-05-26 (×16): 15 ug via RESPIRATORY_TRACT
  Filled 2018-05-17 (×19): qty 2

## 2018-05-17 MED ORDER — FUROSEMIDE 10 MG/ML IJ SOLN: 40.0000 mg | Freq: Once | INTRAMUSCULAR | Status: DC

## 2018-05-17 MED ORDER — NITROGLYCERIN 0.4 MG SL SUBL
0.4000 mg | SUBLINGUAL_TABLET | SUBLINGUAL | Status: DC | PRN
  Administered 2018-05-18 – 2018-05-19 (×5): 0.4 mg via SUBLINGUAL
  Filled 2018-05-17 (×3): qty 1

## 2018-05-17 MED ORDER — HEPARIN (PORCINE) 25000 UT/250ML-% IV SOLN
1500.0000 [IU]/h | INTRAVENOUS | Status: DC
  Administered 2018-05-17: 900 [IU]/h via INTRAVENOUS
  Administered 2018-05-18: 1150 [IU]/h via INTRAVENOUS
  Administered 2018-05-19: 1350 [IU]/h via INTRAVENOUS
  Administered 2018-05-20: 1500 [IU]/h via INTRAVENOUS
  Filled 2018-05-17 (×4): qty 250

## 2018-05-17 MED ORDER — FUROSEMIDE 10 MG/ML IJ SOLN
40.0000 mg | Freq: Once | INTRAMUSCULAR | Status: AC
  Administered 2018-05-17: 40 mg via INTRAVENOUS
  Filled 2018-05-17: qty 4

## 2018-05-17 MED ORDER — ATORVASTATIN CALCIUM 40 MG PO TABS
40.0000 mg | ORAL_TABLET | Freq: Every day | ORAL | Status: DC
  Administered 2018-05-17 – 2018-05-25 (×8): 40 mg via ORAL
  Filled 2018-05-17 (×9): qty 1

## 2018-05-17 MED ORDER — VITAMIN C 500 MG PO TABS
500.0000 mg | ORAL_TABLET | Freq: Every day | ORAL | Status: DC
  Administered 2018-05-18 – 2018-05-26 (×8): 500 mg via ORAL
  Filled 2018-05-17 (×8): qty 1

## 2018-05-17 MED ORDER — UMECLIDINIUM BROMIDE 62.5 MCG/INH IN AEPB
1.0000 | INHALATION_SPRAY | Freq: Every day | RESPIRATORY_TRACT | Status: DC
  Administered 2018-05-18 – 2018-05-26 (×6): 1 via RESPIRATORY_TRACT
  Filled 2018-05-17 (×3): qty 7

## 2018-05-17 MED ORDER — POLYETHYLENE GLYCOL 3350 17 G PO PACK
17.0000 g | PACK | Freq: Every day | ORAL | Status: DC | PRN
  Administered 2018-05-24: 17 g via ORAL
  Filled 2018-05-17: qty 1

## 2018-05-17 MED ORDER — PRIMIDONE 50 MG PO TABS
50.0000 mg | ORAL_TABLET | Freq: Every day | ORAL | Status: DC
  Administered 2018-05-18 – 2018-05-26 (×8): 50 mg via ORAL
  Filled 2018-05-17 (×9): qty 1

## 2018-05-17 NOTE — Progress Notes (Signed)
Spoke with [employer] from cards tonight. We will substitute the Stiolto with Brovana BID + Incruse 1 puff qday.   Onnie Boer, PharmD, BCIDP, AAHIVP, CPP Infectious Disease Pharmacist 05/17/2018 7:31 PM

## 2018-05-17 NOTE — Telephone Encounter (Signed)
I returned phone call to Dr. Felton Clinton at [facility].  Joyce [name] presented there to the ER describing onset of sudden chest pressure reminiscent of previous angina.  Reportedly initial troponin T level was mildly elevated although ECG showed no acute changes.  Patient had been scheduled for routine visit in the office with me later this afternoon.  At this time plan is for Dr. Felton Clinton to contact our service at [facility] regarding transfer for further evaluation.

## 2018-05-17 NOTE — H&P (Addendum)
Cardiology Admission History and Physical:   [name] ID: Joyce [name] MRN: [id number]; DOB: 01-13-1941   Admission date: 05/17/2018  Primary Care Provider: Allie Dimmer, MD Primary Cardiologist: Rozann Lesches, MD  Primary Electrophysiologist:  None   Chief Complaint:  Chest Pain/ NSTEMI  [name] Profile:   Joyce [name] is a 77 y.o. female with a history of multivessel CAD s/p CABG x4 in 2015, hypertension, hyperlipidemia, type 2 diabetes mellitus, and CKD stage III who is being transferred from [facility] to [facility] for NSTEMI.   History of Present Illness:   Joyce [name] is a 77 year old female with a history of multivessel CAD s/p CABG x4 in 2015, hypertension, hyperlipidemia, type 2 diabetes mellitus, and CKD stage III who presented to [facility] ED for evaluation of chest pain that started this morning. [name] reports onset of substernal pressure with radiation to left arm and up left jaw at rest this morning around 8:30am. [name] states it felt like "something was sitting on her chest." [name] reports associated shorntess of breath and palpitations but no diaphoresis, nausea/vomiting, or lightheadedness/dizziness. Symptoms resolved and have not returned since receiving several medications in [facility] ED. [name] denies any exertional chest pain. However, she does report dyspnea on exertion and easily fatigability during PT session over Joyce last 2 weeks which she has never noticed before as well as some recent orthopnea and lower extremity edema. She currently takse Lasix 40mg  four days a week and 20mg  three days a week. [name] denies any recent fevers or illness. She denies any tobacco or alcohol use. She does have a family history of heart disease on father's side of family with paternal grandfather and three of [name]'s paternal uncles having heart attacks.   Records from [facility]: EKG showed no acute ischemic changes compared to prior tracings. Troponin T  minimally elevated at 0.03. Chest x-ray showed no acute findings. D-dimer elevated at 0.58. NT-Pro BNP elevated at 768.9. Na 134, K 4.5, Glucose 370, SCr 1.89. [name] received Nitroglycerin, Aspirin, Fentanyl, and Morphine in [facility] ED. [name] also started on Heparin in [facility] ED.   Past Medical History:  Diagnosis Date  . Anemia   . Arthritis   . Bilateral pleural effusion    Postoperative, status post Pleurx catheter and talc treatment - Dr. Prescott Gum  . 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 Chronic kidney disease (CKD), stage III (moderate) (HCC)   . Chronic lower back pain   . Essential hypertension   . Fibromyalgia   . History of pneumonia   . Hyperlipidemia   . Type II diabetes mellitus ([name])     Past Surgical History:  Procedure Laterality Date  . ABDOMINAL HYSTERECTOMY  1980's  . ANTERIOR CERVICAL DECOMP/DISCECTOMY FUSION  2000's  . APPENDECTOMY  1970's  . BACK SURGERY    . CHEST TUBE INSERTION Left 08/07/2014   Procedure: INSERTION PLEURAL DRAINAGE CATHETER;  Surgeon: Ivin Poot, MD;  Location: [place];  Service: Thoracic;  Laterality: Left;  . CHEST TUBE INSERTION Right 08/12/2014   Procedure: INSERTION PLEURAL DRAINAGE CATHETER;  Surgeon: Ivin Poot, MD;  Location: [place];  Service: Thoracic;  Laterality: Right;  . CHOLECYSTECTOMY  ~ 2005  . CORONARY ARTERY BYPASS GRAFT N/A 06/15/2014   Procedure: CORONARY ARTERY BYPASS GRAFTING (CABG);  Surgeon: Ivin Poot, MD;  Location: [place];  Service:  Open Heart Surgery;  Laterality: N/A;  . INTRAOPERATIVE TRANSESOPHAGEAL ECHOCARDIOGRAM N/A 06/15/2014   Procedure: INTRAOPERATIVE TRANSESOPHAGEAL ECHOCARDIOGRAM;  Surgeon: Ivin Poot, MD;  Location: [place];  Service: Open Heart Surgery;  Laterality: N/A;  . LEFT HEART CATHETERIZATION WITH CORONARY ANGIOGRAM N/A 06/11/2014   Procedure: LEFT HEART CATHETERIZATION WITH CORONARY ANGIOGRAM;   Surgeon: Blane Ohara, MD;  Location: [facility] CATH LAB;  Service: Cardiovascular;  Laterality: N/A;  . LUMBAR DISC SURGERY  1980's X 1; 1990's X  2000's X 1  . REMOVAL OF PLEURAL DRAINAGE CATHETER Bilateral 02/11/2015   Procedure: REMOVAL OF PLEURAL DRAINAGE CATHETER;  Surgeon: Ivin Poot, MD;  Location: [place];  Service: Thoracic;  Laterality: Bilateral;  . TALC PLEURODESIS Bilateral 02/11/2015   Procedure: Pietro Cassis;  Surgeon: Ivin Poot, MD;  Location: [place];  Service: Thoracic;  Laterality: Bilateral;  . TONSILLECTOMY  ~ 1950     Medications Prior to Admission: Prior to Admission medications   Medication Sig Start Date End Date Taking? Authorizing Provider  ACCU-CHEK AVIVA PLUS test strip Inject 1 strip as directed 4 (four) times daily. 11/16/14   [provider]  albuterol (PROVENTIL HFA;VENTOLIN HFA) 108 (90 Base) MCG/ACT inhaler Inhale 2 puffs into Joyce lungs every 4 (four) hours as needed for wheezing or shortness of breath. 07/17/17   Byrum, Rose Fillers, MD  amLODipine (NORVASC) 10 MG tablet Take 10 mg by mouth daily.  05/28/14   [provider]  aspirin EC 81 MG tablet Take 1 tablet (81 mg total) by mouth daily. 01/25/16   Satira Sark, MD  atorvastatin (LIPITOR) 40 MG tablet Take 1 tablet (40 mg total) by mouth daily at 6 PM. 06/22/14   Nani Skillern, PA-C  B Complex-C-Folic Acid (DIALYVITE PO) Take 1 tablet by mouth. 09/22/14   [provider]  Cholecalciferol (VITAMIN D3) 2000 units TABS Take by mouth daily.    [provider]  Ferrous Sulfate (IRON) 28 MG TABS Take 1 tablet by mouth daily.    [provider]  furosemide (LASIX) 20 MG tablet Take 20 mg by mouth.    [provider]  GLUCERNA (GLUCERNA) LIQD Take 237 mLs by mouth daily.    [provider]  hydrALAZINE (APRESOLINE) 100 MG tablet Take 100 mg by mouth 3 (three) times daily.    [provider]  insulin glargine (LANTUS) 100 UNIT/ML  injection Inject 10-40 Units into Joyce skin at bedtime.     [provider]  insulin lispro (HUMALOG) 100 UNIT/ML KiwkPen Inject 0-12 Units into Joyce skin 3 (three) times daily with meals.     [provider]  Insulin Syringe-Needle U-100 (INSULIN SYRINGE .5CC/30GX5/16") 30G X 5/16" 0.5 ML MISC Inject 0.5 mLs as directed daily. 01/18/15   [provider]  Insulin Syringe-Needle U-100 (INSULIN SYRINGE .5CC/30GX5/16") 30G X 5/16" 0.5 ML MISC Inject 0.5 mLs as directed 3 (three) times daily. 01/18/15   [provider]  lisinopril (PRINIVIL,ZESTRIL) 5 MG tablet Take 5 mg by mouth daily.     [provider]  MISC NATURAL PRODUCT OP Take by mouth daily. TerraZyme    [provider]  MISC NATURAL PRODUCTS PO Take by mouth daily. EO Mega    [provider]  MISC NATURAL PRODUCTS PO Take by mouth daily. Alpha CRS    [provider]  MISC NATURAL PRODUCTS PO Take by mouth daily. Micro Plex VM    [provider]  MISC NATURAL PRODUCTS  PO Take by mouth daily. Deep Blue Polyphenol Complex    [provider]  polyethylene glycol (MIRALAX / GLYCOLAX) packet Take 17 g by mouth daily as needed for mild constipation. 08/13/14   Moding, Langley Gauss, MD  primidone (MYSOLINE) 50 MG tablet Take 50 mg by mouth daily.  05/28/14   [provider]  Tiotropium Bromide-Olodaterol (STIOLTO RESPIMAT) 2.5-2.5 MCG/ACT AERS Take 2 puffs by mouth daily. 12/20/17   Collene Gobble, MD  traMADol (ULTRAM) 50 MG tablet Take 1 tablet (50 mg total) by mouth every 6 (six) hours as needed for moderate pain. [name] taking differently: Take 50 mg by mouth every 4 (four) hours as needed for moderate pain or severe pain.  06/22/14   Nani Skillern, PA-C  vitamin C (ASCORBIC ACID) 500 MG tablet Take 500 mg by mouth daily.    [provider]     Allergies:    Allergies  Allergen Reactions  . Carvedilol Other (See Comments)    confusion    . Codeine Other (See Comments)    confusion  . Metoprolol Palpitations    Social History:   Social History   Socioeconomic History  . Marital status: Widowed    Spouse name: Not on file  . Number of children: Not on file  . Years of education: Not on file  . Highest education level: Not on file  Occupational History  . Not on file  Social Needs  . Financial resource strain: Not on file  . Food insecurity:    Worry: Not on file    Inability: Not on file  . Transportation needs:    Medical: Not on file    Non-medical: Not on file  Tobacco Use  . Smoking status: Never Smoker  . Smokeless tobacco: Never Used  Substance and Sexual Activity  . Alcohol use: No    Alcohol/week: 0.0 standard drinks  . Drug use: No  . Sexual activity: Never  Lifestyle  . Physical activity:    Days per week: Not on file    Minutes per session: Not on file  . Stress: Not on file  Relationships  . Social connections:    Talks on phone: Not on file    Gets together: Not on file    Attends religious service: Not on file    Active member of club or organization: Not on file    Attends meetings of clubs or organizations: Not on file    Relationship status: Not on file  . Intimate partner violence:    Fear of current or ex partner: Not on file    Emotionally abused: Not on file    Physically abused: Not on file    Forced sexual activity: Not on file  Other Topics Concern  . Not on file  Social History Narrative  . Not on file    Family History:   Joyce [name]'s family history includes Alcoholism in her brother and brother; Diabetes in her mother; Heart disease in her sister; Hypertension in her mother; Stroke in her father.    ROS:  Review of Systems  Constitutional: Negative for chills, diaphoresis and fever.  HENT: Negative for congestion.   Respiratory: Positive for cough and shortness of breath. Negative for sputum production.   Cardiovascular: Positive for chest pain, palpitations,  orthopnea and leg swelling. Negative for PND.  Gastrointestinal: Negative for nausea and vomiting.  Musculoskeletal: Negative for myalgias.  Neurological: Negative for dizziness, tingling and weakness.  Psychiatric/Behavioral: Negative for  suicidal ideas.  All other systems reviewed and are negative.   Physical Exam/Data:   Vitals:   05/17/18 1720  BP: (!) 148/68  Pulse: 68  Temp: 98.7 F (37.1 C)  TempSrc: Oral  Weight: 89.8 kg  Height: 5\' 5"  (1.651 m)   No intake or output data in Joyce 24 hours ending 05/17/18 1812 Filed Weights   05/17/18 1720  Weight: 89.8 kg   Body mass index is 32.95 kg/m.  General:  77 year old obese Caucasian female resting comfortably in no acute distress. HEENT: Head normocephalic atraumatic. Sclera clear with no signs of icterus. Neck: Supple. Mild JVD. Vascular: Distal pedal pusles and radial pulses 2+ and equal bilaterally.   Cardiac:  RRR. Distinct S1 and S2. No murmurs, gallops, or rubs. Lungs:  No increased work of breathing. Clear to auscultation bilaterally. No wheezing, rhonchi or rales.  Abd: Soft, nontender, no hepatomegaly. Bowel sounds present. Ext: No lower extremity edema. Musculoskeletal:  No deformities. BUE and BLE strength normal and equal. Skin: Warm and dry  Neuro:  No focal abnormalities noted. Psych:  Normal affect. Pleasant and cooperative.    EKG:  Joyce ECG that was done was personally reviewed and demonstrates sinus rhythm with no acute ischemic changes.   Relevant CV Studies:  Echocardiogram 04/18/2017: Study Conclusions - Left ventricle: Joyce cavity size was normal. Wall thickness was   increased in a pattern of mild LVH. Systolic function was   vigorous. Joyce estimated ejection fraction was in Joyce range of 65%   to 70%. Doppler parameters are consistent with abnormal left   ventricular relaxation (grade 1 diastolic dysfunction). - Aortic valve: Mildly calcified annulus. Normal thickness   leaflets. Valve area  (VTI): 4.05 cm^2. Valve area (Vmax): 2.74   cm^2. Valve area (Vmean): 2.77 cm^2. - Atrial septum: No defect or patent foramen ovale was identified. - Systemic veins: IVC is small, suggesting low RA pressure and   hypovolemia. - Technically difficult study. _______________  Laboratory Data:  ChemistryNo results for input(s): NA, K, CL, CO2, GLUCOSE, BUN, CREATININE, CALCIUM, GFRNONAA, GFRAA, ANIONGAP in Joyce last 168 hours.  No results for input(s): PROT, ALBUMIN, AST, ALT, ALKPHOS, BILITOT in Joyce last 168 hours. HematologyNo results for input(s): WBC, RBC, HGB, HCT, MCV, MCH, MCHC, RDW, PLT in Joyce last 168 hours. Cardiac EnzymesNo results for input(s): TROPONINI in Joyce last 168 hours. No results for input(s): TROPIPOC in Joyce last 168 hours.  BNPNo results for input(s): BNP, PROBNP in Joyce last 168 hours.  DDimer No results for input(s): DDIMER in Joyce last 168 hours.  Radiology/Studies:  No results found.  Assessment and Plan:   1.  NSTEMI - [name] presented to [facility] for evaluation of chest pain and was found to have a NSTEMI. - Records from [place]: EKG showed no acute ischemic changes. Troponin T minimally elevated at 0.03. Chest x-ray showed no acute findings. D-dimer elevated at 0.56. NT Pro BNP elevated at 768.9.  - Will recheck EKG. - Will cycle Troponin.  - Will check Echo.  - Will start Heparin drip. - Given CABG, will give loading dose of Brilinta 180mg  tonight and then transition to 90mg  twice daily tomorrow per MD. - Continue Aspirin and statin. [name] reports she is unable to tolerate beta-blockers.  - [name] describes some recent symptoms consistent with heart failure including dyspnea on exertion, orthopnea, and lower extremity edema. [name] has some mild lower extremity edema on exam but does not appear significantly volume overloaded. Will give one  dose of IV Lasix 40mg  tomorrow morning and then can likely transition to PO 40mg  daily.  - Will plan for left  heart catheterization on Monday 05/20/2018. Orders placed.   2. Hypertension  - BP currently elevated with systolic BP in Joyce 086'P. [name] reports this is not abnormal for her. - Continue home medications  - Amlodipine 10mg  daily.  - Lisinopril 10mg  daily. Pending BP in Joyce morning, may need to increase note.  - Hydralazine 100mg  three times daily. - Will continue to monitor.    3. Hyperlipidemia  - Continue Lipitor 40mg  daily.  4. Type 2 Diabetes Mellitus - [name] on 100 units of basal Lantus daily at home. Will order 80 units and start sliding scale insulin.   5. CKD Stage III -  SCr 1.89 at [facility].  - Continue to monitor renal function closely.  Severity of Illness: Joyce appropriate [name] status for this [name] is INPATIENT. Inpatient status is judged to be reasonable and necessary in order to provide Joyce required intensity of service to ensure Joyce [name]'s safety. Joyce [name]'s presenting symptoms, physical exam findings, and initial radiographic and laboratory data in Joyce context of their chronic comorbidities is felt to place them at high risk for further clinical deterioration. Furthermore, it is not anticipated that Joyce [name] will be medically stable for discharge from Joyce hospital within 2 midnights of admission. Joyce following factors support Joyce [name] status of inpatient.   " Joyce [name]'s presenting symptoms include unstable angina. " Joyce physical exam findings are relatively unremarkable. " Joyce initial radiographic and laboratory data are worrisome because of elevated troponin and BNP. " Joyce chronic co-morbidities include hypertension, hyperlipidemia, and diabetes.   * I certify that at Joyce point of admission it is my clinical judgment that Joyce [name] will require inpatient hospital care spanning beyond 2 midnights from Joyce point of admission due to high intensity of service, high risk for further deterioration and high frequency of surveillance required.*     For questions or updates, please contact [place] Please consult [email or web address] for contact info under        Signed, Eppie Gibson  05/17/2018 6:12 PM   Cardiology Attending  [name] seen and examined. Agree with Joyce findings as noted above. Joyce [name] has known CAD, s/p CABG who presents with mostly typical anginal symptoms. She has ruled out for MI, and her chest and neck pressure relieved with slntg. She has NTG paste placed. She is on IV heparin.  We will plan to perform left and right heart cath on Monday, sooner if she becomes unstable.  Mikle Bosworth.D.

## 2018-05-17 NOTE — Progress Notes (Deleted)
Cardiology Office Note  Date: 05/17/2018   ID: Joyce [name], DOB 02-Apr-1941, MRN [id number]  PCP: Allie Dimmer, MD  Primary Cardiologist: Rozann Lesches, MD   No chief complaint on file.   History of Present Illness: Joyce [name] is a 77 y.o. female last seen in April.  Past Medical History:  Diagnosis Date  . Anemia   . Arthritis   . Bilateral pleural effusion    Postoperative, status post Pleurx catheter and talc treatment - Dr. Prescott Gum  . 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 Chronic kidney disease (CKD), stage III (moderate) (HCC)   . Chronic lower back pain   . Essential hypertension   . Fibromyalgia   . History of pneumonia   . Hyperlipidemia   . Type II diabetes mellitus ([place])     Past Surgical History:  Procedure Laterality Date  . ABDOMINAL HYSTERECTOMY  1980's  . ANTERIOR CERVICAL DECOMP/DISCECTOMY FUSION  2000's  . APPENDECTOMY  1970's  . BACK SURGERY    . CHEST TUBE INSERTION Left 08/07/2014   Procedure: INSERTION PLEURAL DRAINAGE CATHETER;  Surgeon: Ivin Poot, MD;  Location: [place];  Service: Thoracic;  Laterality: Left;  . CHEST TUBE INSERTION Right 08/12/2014   Procedure: INSERTION PLEURAL DRAINAGE CATHETER;  Surgeon: Ivin Poot, MD;  Location: [place];  Service: Thoracic;  Laterality: Right;  . CHOLECYSTECTOMY  ~ 2005  . CORONARY ARTERY BYPASS GRAFT N/A 06/15/2014   Procedure: CORONARY ARTERY BYPASS GRAFTING (CABG);  Surgeon: Ivin Poot, MD;  Location: [place];  Service: Open Heart Surgery;  Laterality: N/A;  . INTRAOPERATIVE TRANSESOPHAGEAL ECHOCARDIOGRAM N/A 06/15/2014   Procedure: INTRAOPERATIVE TRANSESOPHAGEAL ECHOCARDIOGRAM;  Surgeon: Ivin Poot, MD;  Location: [place];  Service: Open Heart Surgery;  Laterality: N/A;  . LEFT HEART CATHETERIZATION WITH CORONARY ANGIOGRAM N/A 06/11/2014   Procedure: LEFT HEART CATHETERIZATION WITH  CORONARY ANGIOGRAM;  Surgeon: Blane Ohara, MD;  [facility];  Service: Cardiovascular;  Laterality: N/A;  . LUMBAR DISC SURGERY  1980's X 1; 1990's X  2000's X 1  . REMOVAL OF PLEURAL DRAINAGE CATHETER Bilateral 02/11/2015   Procedure: REMOVAL OF PLEURAL DRAINAGE CATHETER;  Surgeon: Ivin Poot, MD;  Location: [place];  Service: Thoracic;  Laterality: Bilateral;  . TALC PLEURODESIS Bilateral 02/11/2015   Procedure: Pietro Cassis;  Surgeon: Ivin Poot, MD;  Location: [place];  Service: Thoracic;  Laterality: Bilateral;  . TONSILLECTOMY  ~ 1950    Current Outpatient Medications  Medication Sig Dispense Refill  . ACCU-CHEK AVIVA PLUS test strip Inject 1 strip as directed 4 (four) times daily.  2  . albuterol (PROVENTIL HFA;VENTOLIN HFA) 108 (90 Base) MCG/ACT inhaler Inhale 2 puffs into the lungs every 4 (four) hours as needed for wheezing or shortness of breath. 1 Inhaler 5  . amLODipine (NORVASC) 10 MG tablet Take 10 mg by mouth daily.   5  . aspirin EC 81 MG tablet Take 1 tablet (81 mg total) by mouth daily.    Marland Kitchen atorvastatin (LIPITOR) 40 MG tablet Take 1 tablet (40 mg total) by mouth daily at 6 PM.    . B Complex-C-Folic Acid (DIALYVITE PO) Take 1 tablet by mouth.    . Cholecalciferol (VITAMIN D3) 2000 units TABS Take by mouth daily.    . Ferrous Sulfate (IRON) 28 MG TABS Take 1  tablet by mouth daily.    . furosemide (LASIX) 20 MG tablet Take 20 mg by mouth.    Marland Kitchen GLUCERNA (GLUCERNA) LIQD Take 237 mLs by mouth daily.    . hydrALAZINE (APRESOLINE) 100 MG tablet Take 100 mg by mouth 3 (three) times daily.    . insulin glargine (LANTUS) 100 UNIT/ML injection Inject 10-40 Units into the skin at bedtime.     . insulin lispro (HUMALOG) 100 UNIT/ML KiwkPen Inject 0-12 Units into the skin 3 (three) times daily with meals.     . Insulin Syringe-Needle U-100 (INSULIN SYRINGE .5CC/30GX5/16") 30G X 5/16" 0.5 ML MISC Inject 0.5 mLs as directed daily.    . Insulin Syringe-Needle U-100  (INSULIN SYRINGE .5CC/30GX5/16") 30G X 5/16" 0.5 ML MISC Inject 0.5 mLs as directed 3 (three) times daily.  3  . lisinopril (PRINIVIL,ZESTRIL) 5 MG tablet Take 5 mg by mouth daily.     Marland Kitchen MISC NATURAL PRODUCT OP Take by mouth daily. TerraZyme    . MISC NATURAL PRODUCTS PO Take by mouth daily. EO Mega    . MISC NATURAL PRODUCTS PO Take by mouth daily. Alpha CRS    . MISC NATURAL PRODUCTS PO Take by mouth daily. Micro Plex VM    . MISC NATURAL PRODUCTS PO Take by mouth daily. Deep Blue Polyphenol Complex    . polyethylene glycol (MIRALAX / GLYCOLAX) packet Take 17 g by mouth daily as needed for mild constipation. 14 each 2  . primidone (MYSOLINE) 50 MG tablet Take 50 mg by mouth daily.   5  . Tiotropium Bromide-Olodaterol (STIOLTO RESPIMAT) 2.5-2.5 MCG/ACT AERS Take 2 puffs by mouth daily. 1 Inhaler 5  . traMADol (ULTRAM) 50 MG tablet Take 1 tablet (50 mg total) by mouth every 6 (six) hours as needed for moderate pain. (Patient taking differently: Take 50 mg by mouth every 4 (four) hours as needed for moderate pain or severe pain. ) 30 tablet 0  . vitamin C (ASCORBIC ACID) 500 MG tablet Take 500 mg by mouth daily.     No current facility-administered medications for this visit.    Allergies:  Carvedilol; Codeine; and Metoprolol   Social History: The patient  reports that she has never smoked. She has never used smokeless tobacco. She reports that she does not drink alcohol or use drugs.   Family History: The patient's family history includes Alcoholism in her brother and brother; Diabetes in her mother; Heart disease in her sister; Hypertension in her mother; Stroke in her father.   ROS:  Please see the history of present illness. Otherwise, complete review of systems is positive for {NONE DEFAULTED:18576::"none"}.  All other systems are reviewed and negative.   Physical Exam: VS:  There were no vitals taken for this visit., BMI There is no height or weight on file to calculate BMI.  Wt  Readings from Last 3 Encounters:  10/05/17 205 lb (93 kg)  07/17/17 207 lb 6.4 oz (94.1 kg)  05/30/17 208 lb (94.3 kg)    General: Patient appears comfortable at rest. HEENT: Conjunctiva and lids normal, oropharynx clear with moist mucosa. Neck: Supple, no elevated JVP or carotid bruits, no thyromegaly. Lungs: Clear to auscultation, nonlabored breathing at rest. Cardiac: Regular rate and rhythm, no S3 or significant systolic murmur, no pericardial rub. Abdomen: Soft, nontender, no hepatomegaly, bowel sounds present, no guarding or rebound. Extremities: No pitting edema, distal pulses 2+. Skin: Warm and dry. Musculoskeletal: No kyphosis. Neuropsychiatric: Alert and oriented x3, affect grossly appropriate.  ECG:  I personally reviewed the tracing from 10/05/2017 which showed sinus rhythm with lead motion artifact and nonspecific ST-T changes.  Recent Labwork:  October 2019: Hemoglobin 11, platelets 151, potassium 4.9, BUN 38, creatinine 1.71, hemoglobin A1c 5.6, AST 15, ALT 11, cholesterol 137, triglycerides 169, HDL 41, LDL 62  Other Studies Reviewed Today:  Echocardiogram 04/18/2017: Study Conclusions  - Left ventricle: The cavity size was normal. Wall thickness was increased in a pattern of mild LVH. Systolic function was vigorous. The estimated ejection fraction was in the range of 65% to 70%. Doppler parameters are consistent with abnormal left ventricular relaxation (grade 1 diastolic dysfunction). - Aortic valve: Mildly calcified annulus. Normal thickness leaflets. Valve area (VTI): 4.05 cm^2. Valve area (Vmax): 2.74 cm^2. Valve area (Vmean): 2.77 cm^2. - Atrial septum: No defect or patent foramen ovale was identified. - Systemic veins: IVC is small, suggesting low RA pressure and hypovolemia. - Technically difficult study.  Assessment and Plan:    Current medicines were reviewed with the patient today.  No orders of the defined types were placed in  this encounter.   Disposition:  Signed, Satira Sark, MD, [facility] : [facility] 05/17/2018 11:14 AM    [facility] [place], [place], [place] [address] Phone: [phone]; Fax: [phone]

## 2018-05-17 NOTE — Progress Notes (Signed)
ANTICOAGULATION CONSULT NOTE - Initial Consult  Pharmacy Consult for heparin  Indication: chest pain/ACS  Allergies  Allergen Reactions  . Carvedilol Other (See Comments)    confusion  . Codeine Other (See Comments)    confusion  . Metoprolol Palpitations    Patient Measurements: Height: 5\' 5"  (165.1 cm) Weight: 198 lb (89.8 kg) IBW/kg (Calculated) : 57 Heparin Dosing Weight: 77kg  Vital Signs: Temp: 98.7 F (37.1 C) (11/22 1720) Temp Source: Oral (11/22 1720) BP: 148/68 (11/22 1720) Pulse Rate: 68 (11/22 1720)  Labs: No results for input(s): HGB, HCT, PLT, APTT, LABPROT, INR, HEPARINUNFRC, HEPRLOWMOCWT, CREATININE, CKTOTAL, CKMB, TROPONINI in the last 72 hours.  CrCl cannot be calculated (Patient's most recent lab result is older than the maximum 21 days allowed.).   Medical History: Past Medical History:  Diagnosis Date  . Anemia   . Arthritis   . Bilateral pleural effusion    Postoperative, status post Pleurx catheter and talc treatment - Dr. Prescott Gum  . 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 Chronic kidney disease (CKD), stage III (moderate) (HCC)   . Chronic lower back pain   . Essential hypertension   . Fibromyalgia   . History of pneumonia   . Hyperlipidemia   . Type II diabetes mellitus (HCC)     Medications:  Medications Prior to Admission  Medication Sig Dispense Refill Last Dose  . ACCU-CHEK AVIVA PLUS test strip Inject 1 strip as directed 4 (four) times daily.  2 Taking  . albuterol (PROVENTIL HFA;VENTOLIN HFA) 108 (90 Base) MCG/ACT inhaler Inhale 2 puffs into the lungs every 4 (four) hours as needed for wheezing or shortness of breath. 1 Inhaler 5 Taking  . amLODipine (NORVASC) 10 MG tablet Take 10 mg by mouth daily.   5 Taking  . aspirin EC 81 MG tablet Take 1 tablet (81 mg total) by mouth daily.   Taking  . atorvastatin (LIPITOR) 40 MG tablet Take 1 tablet  (40 mg total) by mouth daily at 6 PM.   Taking  . B Complex-C-Folic Acid (DIALYVITE PO) Take 1 tablet by mouth.   Taking  . Cholecalciferol (VITAMIN D3) 2000 units TABS Take by mouth daily.   Taking  . Ferrous Sulfate (IRON) 28 MG TABS Take 1 tablet by mouth daily.   Taking  . furosemide (LASIX) 20 MG tablet Take 20 mg by mouth.   Taking  . GLUCERNA (GLUCERNA) LIQD Take 237 mLs by mouth daily.   Taking  . hydrALAZINE (APRESOLINE) 100 MG tablet Take 100 mg by mouth 3 (three) times daily.   Taking  . insulin glargine (LANTUS) 100 UNIT/ML injection Inject 10-40 Units into the skin at bedtime.    Taking  . insulin lispro (HUMALOG) 100 UNIT/ML KiwkPen Inject 0-12 Units into the skin 3 (three) times daily with meals.    Taking  . Insulin Syringe-Needle U-100 (INSULIN SYRINGE .5CC/30GX5/16") 30G X 5/16" 0.5 ML MISC Inject 0.5 mLs as directed daily.   Taking  . Insulin Syringe-Needle U-100 (INSULIN SYRINGE .5CC/30GX5/16") 30G X 5/16" 0.5 ML MISC Inject 0.5 mLs as directed 3 (three) times daily.  3 Taking  . lisinopril (PRINIVIL,ZESTRIL) 5 MG tablet Take 5 mg by mouth daily.    Taking  . MISC NATURAL PRODUCT OP Take by mouth daily. TerraZyme   Taking  . MISC NATURAL PRODUCTS PO Take by mouth daily. EO  Mega   Taking  . MISC NATURAL PRODUCTS PO Take by mouth daily. Alpha CRS   Taking  . MISC NATURAL PRODUCTS PO Take by mouth daily. Micro Plex VM   Taking  . MISC NATURAL PRODUCTS PO Take by mouth daily. Deep Blue Polyphenol Complex   Taking  . polyethylene glycol (MIRALAX / GLYCOLAX) packet Take 17 g by mouth daily as needed for mild constipation. 14 each 2 Taking  . primidone (MYSOLINE) 50 MG tablet Take 50 mg by mouth daily.   5 Taking  . Tiotropium Bromide-Olodaterol (STIOLTO RESPIMAT) 2.5-2.5 MCG/ACT AERS Take 2 puffs by mouth daily. 1 Inhaler 5   . traMADol (ULTRAM) 50 MG tablet Take 1 tablet (50 mg total) by mouth every 6 (six) hours as needed for moderate pain. (Patient taking differently: Take 50 mg  by mouth every 4 (four) hours as needed for moderate pain or severe pain. ) 30 tablet 0 Taking  . vitamin C (ASCORBIC ACID) 500 MG tablet Take 500 mg by mouth daily.   Taking   Scheduled:  . [START ON 05/18/2018] insulin aspart  0-20 Units Subcutaneous TID WC  . insulin glargine  80 Units Subcutaneous QHS    Assessment: 77 yo female with CP to start heparin per pharmacy protocol. No anticoagulants noted PTA.  Goal of Therapy:  Heparin level 0.3-0.7 units/ml Monitor platelets by anticoagulation protocol: Yes   Plan:  -Heparin bolus 4000 units IV followed by 900 units/hr (~ 12 units/kg/hr) -Heparin level in 8 hours and daily wth CBC daily  Hildred Laser, PharmD Clinical Pharmacist **Pharmacist phone directory can now be found on [email or web address] ([email or web address]).  Listed under [place].

## 2018-05-17 NOTE — Telephone Encounter (Signed)
Dr. Felton Clinton (ER Doctor) at [facility] called requesting to speak with Dr. Domenic Polite. Joyce [name] is in the ER. She has appointment today at 3:20pm. Please call [phone].

## 2018-05-18 ENCOUNTER — Other Ambulatory Visit (HOSPITAL_COMMUNITY): Payer: Medicare Other

## 2018-05-18 LAB — BASIC METABOLIC PANEL
Anion gap: 10 (ref 5–15)
Anion gap: 9 (ref 5–15)
BUN: 41 mg/dL — AB (ref 8–23)
BUN: 39 mg/dL — ABNORMAL HIGH (ref 8–23)
CO2: 21 mmol/L — AB (ref 22–32)
CO2: 22 mmol/L (ref 22–32)
CREATININE: 2.11 mg/dL — AB (ref 0.44–1.00)
Calcium: 8.6 mg/dL — ABNORMAL LOW (ref 8.9–10.3)
Calcium: 8.8 mg/dL — ABNORMAL LOW (ref 8.9–10.3)
Chloride: 106 mmol/L (ref 98–111)
Chloride: 105 mmol/L (ref 98–111)
Creatinine, Ser: 2.2 mg/dL — ABNORMAL HIGH (ref 0.44–1.00)
GFR calc Af Amer: 25 mL/min — ABNORMAL LOW (ref >60)
GFR calc non Af Amer: 22 mL/min — ABNORMAL LOW (ref >60)
GFR calc non Af Amer: 21 mL/min — ABNORMAL LOW (ref >60)
GFR, EST AFRICAN AMERICAN: 24 mL/min — AB (ref >60)
Glucose, Bld: 119 mg/dL — ABNORMAL HIGH (ref 70–99)
Glucose, Bld: 104 mg/dL — ABNORMAL HIGH (ref 70–99)
POTASSIUM: 4.4 mmol/L (ref 3.5–5.1)
POTASSIUM: 4.3 mmol/L (ref 3.5–5.1)
SODIUM: 136 mmol/L (ref 135–145)
Sodium: 137 mmol/L (ref 135–145)

## 2018-05-18 LAB — HEPARIN LEVEL (UNFRACTIONATED)
HEPARIN UNFRACTIONATED: 0.28 [IU]/mL — AB (ref 0.30–0.70)
HEPARIN UNFRACTIONATED: 0.17 [IU]/mL — AB (ref 0.30–0.70)
Heparin Unfractionated: 0.17 IU/mL — ABNORMAL LOW (ref 0.30–0.70)

## 2018-05-18 LAB — CBC WITH DIFFERENTIAL/PLATELET
ABS IMMATURE GRANULOCYTES: 0 10*3/uL (ref 0.00–0.07)
BASOS ABS: 0 10*3/uL (ref 0.0–0.1)
Basophils Relative: 0 %
EOS PCT: 2 %
Eosinophils Absolute: 0.1 10*3/uL (ref 0.0–0.5)
HEMATOCRIT: 27.8 % — AB (ref 36.0–46.0)
HEMOGLOBIN: 8.9 g/dL — AB (ref 12.0–15.0)
Immature Granulocytes: 0 %
LYMPHS ABS: 1.7 10*3/uL (ref 0.7–4.0)
Lymphocytes Relative: 40 %
MCH: 28.4 pg (ref 26.0–34.0)
MCHC: 32 g/dL (ref 30.0–36.0)
MCV: 88.8 fL (ref 80.0–100.0)
MONO ABS: 0.4 10*3/uL (ref 0.1–1.0)
Monocytes Relative: 9 %
NEUTROS ABS: 2.1 10*3/uL (ref 1.7–7.7)
NRBC: 0 % (ref 0.0–0.2)
Neutrophils Relative %: 49 %
Platelets: 122 10*3/uL — ABNORMAL LOW (ref 150–400)
RBC: 3.13 MIL/uL — AB (ref 3.87–5.11)
RDW: 13.1 % (ref 11.5–15.5)
WBC: 4.3 10*3/uL (ref 4.0–10.5)

## 2018-05-18 LAB — CBC
HCT: 29.8 % — ABNORMAL LOW (ref 36.0–46.0)
Hemoglobin: 9.2 g/dL — ABNORMAL LOW (ref 12.0–15.0)
MCH: 28 pg (ref 26.0–34.0)
MCHC: 30.9 g/dL (ref 30.0–36.0)
MCV: 90.6 fL (ref 80.0–100.0)
NRBC: 0 % (ref 0.0–0.2)
Platelets: 120 10*3/uL — ABNORMAL LOW (ref 150–400)
RBC: 3.29 MIL/uL — ABNORMAL LOW (ref 3.87–5.11)
RDW: 13.1 % (ref 11.5–15.5)
WBC: 3.6 10*3/uL — AB (ref 4.0–10.5)

## 2018-05-18 LAB — GLUCOSE, CAPILLARY
Glucose-Capillary: 157 mg/dL — ABNORMAL HIGH (ref 70–99)
Glucose-Capillary: 141 mg/dL — ABNORMAL HIGH (ref 70–99)
Glucose-Capillary: 157 mg/dL — ABNORMAL HIGH (ref 70–99)
Glucose-Capillary: 133 mg/dL — ABNORMAL HIGH (ref 70–99)

## 2018-05-18 LAB — LIPID PANEL
CHOL/HDL RATIO: 2.7 ratio
Cholesterol: 111 mg/dL (ref 0–200)
HDL: 41 mg/dL (ref >40)
LDL CALC: 47 mg/dL (ref 0–99)
Triglycerides: 116 mg/dL (ref <150)
VLDL: 23 mg/dL (ref 0–40)

## 2018-05-18 LAB — TROPONIN I
TROPONIN I: 0.31 ng/mL — AB (ref <0.03)
Troponin I: 0.2 ng/mL (ref <0.03)

## 2018-05-18 NOTE — Progress Notes (Signed)
Progress Note  Patient Name: Joyce [name] Date of Encounter: 05/18/2018  Primary Cardiologist: Rozann Lesches, MD   Subjective   Had chest pain this morning, relieved with ntg  Inpatient Medications    Scheduled Meds: . amLODipine  10 mg Oral Daily  . arformoterol  15 mcg Nebulization BID  . aspirin EC  81 mg Oral Daily  . atorvastatin  40 mg Oral q1800  . ferrous sulfate  325 mg Oral Daily  . hydrALAZINE  100 mg Oral Q8H  . insulin aspart  0-20 Units Subcutaneous TID WC  . insulin glargine  80 Units Subcutaneous QHS  . lisinopril  10 mg Oral Daily  . primidone  50 mg Oral Daily  . ticagrelor  90 mg Oral BID  . umeclidinium bromide  1 puff Inhalation Daily  . vitamin C  500 mg Oral Daily   Continuous Infusions: . heparin 1,000 Units/hr (05/18/18 0531)   PRN Meds: acetaminophen, albuterol, nitroGLYCERIN, ondansetron (ZOFRAN) IV, polyethylene glycol, traMADol   Vital Signs    Vitals:   05/17/18 2132 05/18/18 0431 05/18/18 0706 05/18/18 0836  BP: (!) 123/58 (!) 140/51 (!) 138/59   Pulse: 65 71    Resp:  18    Temp: 98.4 F (36.9 C) 98.7 F (37.1 C)    TempSrc: Oral Oral    SpO2: 100%  99% 100%  Weight:  91.2 kg    Height:        Intake/Output Summary (Last 24 hours) at 05/18/2018 1150 Last data filed at 05/18/2018 0433 Gross per 24 hour  Intake 0 ml  Output 1100 ml  Net -1100 ml   Filed Weights   05/17/18 1720 05/18/18 0431  Weight: 89.8 kg 91.2 kg    Telemetry    nsr - Personally Reviewed  ECG    nsr - Personally Reviewed  Physical Exam   GEN: No acute distress.   Neck: No JVD Cardiac: RRR, no murmurs, rubs, or gallops.  Respiratory: Clear to auscultation bilaterally. GI: Soft, nontender, non-distended  MS: No edema; No deformity. Neuro:  Nonfocal  Psych: Normal affect   Labs    Chemistry Recent Labs  Lab 05/18/18 0104 05/18/18 0542  NA 137 136  K 4.4 4.3  CL 106 105  CO2 21* 22  GLUCOSE 119* 104*  BUN 41* 39*    CREATININE 2.11* 2.20*  CALCIUM 8.6* 8.8*  GFRNONAA 22* 21*  GFRAA 25* 24*  ANIONGAP 10 9     Hematology Recent Labs  Lab 05/18/18 0104 05/18/18 0542  WBC 4.3 3.6*  RBC 3.13* 3.29*  HGB 8.9* 9.2*  HCT 27.8* 29.8*  MCV 88.8 90.6  MCH 28.4 28.0  MCHC 32.0 30.9  RDW 13.1 13.1  PLT 122* 120*    Cardiac Enzymes Recent Labs  Lab 05/17/18 1925 05/18/18 0104 05/18/18 0542  TROPONINI 0.31* 0.31* 0.20*   No results for input(s): TROPIPOC in the last 168 hours.   BNPNo results for input(s): BNP, PROBNP in the last 168 hours.   DDimer No results for input(s): DDIMER in the last 168 hours.   Radiology    No results found.  Cardiac Studies   none  Patient Profile     76 y.o. female admitted with USA/NSTEMI, now pain free  Assessment & Plan    1. USA/NSTEMI - she is now pain free. Continue IV heparin and ntg paste. Left heart cath on Monday.  For questions or updates, please contact [place] Please consult [email or web address] for  contact info under Cardiology/STEMI.      Signed, Cristopher Peru, MD  05/18/2018, 11:50 AM  Patient ID: Joyce [name], female   DOB: 23-Apr-1941, 77 y.o.   MRN: [id number]

## 2018-05-18 NOTE — Progress Notes (Signed)
[place] for Heparin  Indication: chest pain/ACS  Allergies  Allergen Reactions  . Carvedilol Other (See Comments)    confusion  . Codeine Other (See Comments)    confusion  . Metoprolol Palpitations    Patient Measurements: Height: 5\' 5"  (165.1 cm) Weight: 201 lb (91.2 kg) IBW/kg (Calculated) : 57 Heparin Dosing Weight: 77kg  Vital Signs: Temp: 98.7 F (37.1 C) (11/23 0431) Temp Source: Oral (11/23 1225) BP: 138/50 (11/23 1225) Pulse Rate: 80 (11/23 1225)  Labs: Recent Labs    05/17/18 1925 05/17/18 2105 05/18/18 0104 05/18/18 0418 05/18/18 0542 05/18/18 1342  HGB  --   --  8.9*  --  9.2*  --   HCT  --   --  27.8*  --  29.8*  --   PLT  --   --  122*  --  120*  --   LABPROT  --  14.4  --   --   --   --   INR  --  1.13  --   --   --   --   HEPARINUNFRC  --   --   --  0.28*  --  0.17*  CREATININE  --   --  2.11*  --  2.20*  --   TROPONINI 0.31*  --  0.31*  --  0.20*  --     Estimated Creatinine Clearance: 24.7 mL/min (A) (by C-G formula based on SCr of 2.2 mg/dL (H)).   Medical History: Past Medical History:  Diagnosis Date  . Anemia   . Arthritis   . Bilateral pleural effusion    Postoperative, status post Pleurx catheter and talc treatment - Dr. Prescott Gum  . 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 Chronic kidney disease (CKD), stage III (moderate) (HCC)   . Chronic lower back pain   . Essential hypertension   . Fibromyalgia   . History of pneumonia   . Hyperlipidemia   . Type II diabetes mellitus (HCC)     Medications:  Medications Prior to Admission  Medication Sig Dispense Refill Last Dose  . ACCU-CHEK AVIVA PLUS test strip Inject 1 strip as directed 4 (four) times daily.  2 Taking  . albuterol (PROVENTIL HFA;VENTOLIN HFA) 108 (90 Base) MCG/ACT inhaler Inhale 2 puffs into the lungs every 4 (four) hours as needed for  wheezing or shortness of breath. 1 Inhaler 5 Taking  . amLODipine (NORVASC) 10 MG tablet Take 10 mg by mouth daily.   5 Taking  . aspirin EC 81 MG tablet Take 1 tablet (81 mg total) by mouth daily.   Taking  . atorvastatin (LIPITOR) 40 MG tablet Take 1 tablet (40 mg total) by mouth daily at 6 PM.   Taking  . B Complex-C-Folic Acid (DIALYVITE PO) Take 1 tablet by mouth.   Taking  . Cholecalciferol (VITAMIN D3) 2000 units TABS Take by mouth daily.   Taking  . Ferrous Sulfate (IRON) 28 MG TABS Take 1 tablet by mouth daily.   Taking  . furosemide (LASIX) 20 MG tablet Take 20 mg by mouth.   Taking  . GLUCERNA (GLUCERNA) LIQD Take 237 mLs by mouth daily.   Taking  . hydrALAZINE (APRESOLINE) 100 MG tablet Take 100 mg by mouth 3 (three) times daily.   Taking  . insulin glargine (LANTUS) 100 UNIT/ML injection Inject 10-40  Units into the skin at bedtime.    Taking  . insulin lispro (HUMALOG) 100 UNIT/ML KiwkPen Inject 0-12 Units into the skin 3 (three) times daily with meals.    Taking  . Insulin Syringe-Needle U-100 (INSULIN SYRINGE .5CC/30GX5/16") 30G X 5/16" 0.5 ML MISC Inject 0.5 mLs as directed daily.   Taking  . Insulin Syringe-Needle U-100 (INSULIN SYRINGE .5CC/30GX5/16") 30G X 5/16" 0.5 ML MISC Inject 0.5 mLs as directed 3 (three) times daily.  3 Taking  . lisinopril (PRINIVIL,ZESTRIL) 5 MG tablet Take 5 mg by mouth daily.    Taking  . MISC NATURAL PRODUCT OP Take by mouth daily. TerraZyme   Taking  . MISC NATURAL PRODUCTS PO Take by mouth daily. EO Mega   Taking  . MISC NATURAL PRODUCTS PO Take by mouth daily. Alpha CRS   Taking  . MISC NATURAL PRODUCTS PO Take by mouth daily. Micro Plex VM   Taking  . MISC NATURAL PRODUCTS PO Take by mouth daily. Deep Blue Polyphenol Complex   Taking  . polyethylene glycol (MIRALAX / GLYCOLAX) packet Take 17 g by mouth daily as needed for mild constipation. 14 each 2 Taking  . primidone (MYSOLINE) 50 MG tablet Take 50 mg by mouth daily.   5 Taking  . Tiotropium  Bromide-Olodaterol (STIOLTO RESPIMAT) 2.5-2.5 MCG/ACT AERS Take 2 puffs by mouth daily. 1 Inhaler 5   . traMADol (ULTRAM) 50 MG tablet Take 1 tablet (50 mg total) by mouth every 6 (six) hours as needed for moderate pain. (Patient taking differently: Take 50 mg by mouth every 4 (four) hours as needed for moderate pain or severe pain. ) 30 tablet 0 Taking  . vitamin C (ASCORBIC ACID) 500 MG tablet Take 500 mg by mouth daily.   Taking   Scheduled:  . amLODipine  10 mg Oral Daily  . arformoterol  15 mcg Nebulization BID  . aspirin EC  81 mg Oral Daily  . atorvastatin  40 mg Oral q1800  . ferrous sulfate  325 mg Oral Daily  . hydrALAZINE  100 mg Oral Q8H  . insulin aspart  0-20 Units Subcutaneous TID WC  . insulin glargine  80 Units Subcutaneous QHS  . lisinopril  10 mg Oral Daily  . primidone  50 mg Oral Daily  . ticagrelor  90 mg Oral BID  . umeclidinium bromide  1 puff Inhalation Daily  . vitamin C  500 mg Oral Daily    Assessment: 77 yo female with CP to start heparin per pharmacy protocol. No anticoagulants noted PTA.  Heparin level came back subtherapeutic at 0.17, on 1000 units/hr. Hgb 9.2, plt 120. Trop 0.31>0.2. No s/sx of bleeding. No infusion issues.   Goal of Therapy:  Heparin level 0.3-0.7 units/ml Monitor platelets by anticoagulation protocol: Yes   Plan:  -Increase heparin to 1150 units/hr -Re-check heparin level in 8 hours -Plan for [facility] on Monday   Antonietta Jewel, PharmD Clinical Pharmacist  Pager: [phone] Phone: [phone]

## 2018-05-18 NOTE — Progress Notes (Signed)
0700hrs pt c/o chest pain of 8 radiating to neck.  Nitro tab 0.4mg  given x1 with relief.    1700hrs pt c/o chest pain of 4 radiating to neck.  Nitro tab 0.4mg  given x1 with relief of no pain in 39min.  BP remained stable.

## 2018-05-18 NOTE — Progress Notes (Addendum)
[place] for Heparin  Indication: chest pain/ACS  Allergies  Allergen Reactions  . Carvedilol Other (See Comments)    confusion  . Codeine Other (See Comments)    confusion  . Metoprolol Palpitations    Patient Measurements: Height: 5\' 5"  (165.1 cm) Weight: 201 lb (91.2 kg) IBW/kg (Calculated) : 57 Heparin Dosing Weight: 77kg  Vital Signs: Temp: 98.7 F (37.1 C) (11/23 0431) Temp Source: Oral (11/23 0431) BP: 140/51 (11/23 0431) Pulse Rate: 71 (11/23 0431)  Labs: Recent Labs    05/17/18 1925 05/17/18 2105 05/18/18 0104 05/18/18 0418  HGB  --   --  8.9*  --   HCT  --   --  27.8*  --   PLT  --   --  122*  --   LABPROT  --  14.4  --   --   INR  --  1.13  --   --   HEPARINUNFRC  --   --   --  0.28*  CREATININE  --   --  2.11*  --   TROPONINI 0.31*  --  0.31*  --     Estimated Creatinine Clearance: 25.7 mL/min (A) (by C-G formula based on SCr of 2.11 mg/dL (H)).   Medical History: Past Medical History:  Diagnosis Date  . Anemia   . Arthritis   . Bilateral pleural effusion    Postoperative, status post Pleurx catheter and talc treatment - Dr. Prescott Gum  . 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 Chronic kidney disease (CKD), stage III (moderate) (HCC)   . Chronic lower back pain   . Essential hypertension   . Fibromyalgia   . History of pneumonia   . Hyperlipidemia   . Type II diabetes mellitus (HCC)     Medications:  Medications Prior to Admission  Medication Sig Dispense Refill Last Dose  . ACCU-CHEK AVIVA PLUS test strip Inject 1 strip as directed 4 (four) times daily.  2 Taking  . albuterol (PROVENTIL HFA;VENTOLIN HFA) 108 (90 Base) MCG/ACT inhaler Inhale 2 puffs into the lungs every 4 (four) hours as needed for wheezing or shortness of breath. 1 Inhaler 5 Taking  . amLODipine (NORVASC) 10 MG tablet Take 10 mg by mouth daily.    5 Taking  . aspirin EC 81 MG tablet Take 1 tablet (81 mg total) by mouth daily.   Taking  . atorvastatin (LIPITOR) 40 MG tablet Take 1 tablet (40 mg total) by mouth daily at 6 PM.   Taking  . B Complex-C-Folic Acid (DIALYVITE PO) Take 1 tablet by mouth.   Taking  . Cholecalciferol (VITAMIN D3) 2000 units TABS Take by mouth daily.   Taking  . Ferrous Sulfate (IRON) 28 MG TABS Take 1 tablet by mouth daily.   Taking  . furosemide (LASIX) 20 MG tablet Take 20 mg by mouth.   Taking  . GLUCERNA (GLUCERNA) LIQD Take 237 mLs by mouth daily.   Taking  . hydrALAZINE (APRESOLINE) 100 MG tablet Take 100 mg by mouth 3 (three) times daily.   Taking  . insulin glargine (LANTUS) 100 UNIT/ML injection Inject 10-40 Units into the skin at bedtime.    Taking  . insulin lispro (HUMALOG) 100 UNIT/ML KiwkPen Inject 0-12 Units into the skin 3 (three) times daily with meals.    Taking  . Insulin Syringe-Needle U-100 (INSULIN  SYRINGE .5CC/30GX5/16") 30G X 5/16" 0.5 ML MISC Inject 0.5 mLs as directed daily.   Taking  . Insulin Syringe-Needle U-100 (INSULIN SYRINGE .5CC/30GX5/16") 30G X 5/16" 0.5 ML MISC Inject 0.5 mLs as directed 3 (three) times daily.  3 Taking  . lisinopril (PRINIVIL,ZESTRIL) 5 MG tablet Take 5 mg by mouth daily.    Taking  . MISC NATURAL PRODUCT OP Take by mouth daily. TerraZyme   Taking  . MISC NATURAL PRODUCTS PO Take by mouth daily. EO Mega   Taking  . MISC NATURAL PRODUCTS PO Take by mouth daily. Alpha CRS   Taking  . MISC NATURAL PRODUCTS PO Take by mouth daily. Micro Plex VM   Taking  . MISC NATURAL PRODUCTS PO Take by mouth daily. Deep Blue Polyphenol Complex   Taking  . polyethylene glycol (MIRALAX / GLYCOLAX) packet Take 17 g by mouth daily as needed for mild constipation. 14 each 2 Taking  . primidone (MYSOLINE) 50 MG tablet Take 50 mg by mouth daily.   5 Taking  . Tiotropium Bromide-Olodaterol (STIOLTO RESPIMAT) 2.5-2.5 MCG/ACT AERS Take 2 puffs by mouth daily. 1 Inhaler 5   . traMADol  (ULTRAM) 50 MG tablet Take 1 tablet (50 mg total) by mouth every 6 (six) hours as needed for moderate pain. (Patient taking differently: Take 50 mg by mouth every 4 (four) hours as needed for moderate pain or severe pain. ) 30 tablet 0 Taking  . vitamin C (ASCORBIC ACID) 500 MG tablet Take 500 mg by mouth daily.   Taking   Scheduled:  . amLODipine  10 mg Oral Daily  . arformoterol  15 mcg Nebulization BID  . aspirin EC  81 mg Oral Daily  . atorvastatin  40 mg Oral q1800  . ferrous sulfate  325 mg Oral Daily  . hydrALAZINE  100 mg Oral Q8H  . insulin aspart  0-20 Units Subcutaneous TID WC  . insulin glargine  80 Units Subcutaneous QHS  . lisinopril  10 mg Oral Daily  . primidone  50 mg Oral Daily  . ticagrelor  90 mg Oral BID  . umeclidinium bromide  1 puff Inhalation Daily  . vitamin C  500 mg Oral Daily    Assessment: 77 yo female with CP to start heparin per pharmacy protocol. No anticoagulants noted PTA.  11/23 AM update: heparin level just below goal this AM, no issues per RN.   Goal of Therapy:  Heparin level 0.3-0.7 units/ml Monitor platelets by anticoagulation protocol: Yes   Plan:  -Inc heparin to 1000 units/hr -Re-check heparin level in 6-8 hours  Narda Bonds, PharmD, [place] Pharmacist Phone: [phone]

## 2018-05-19 ENCOUNTER — Inpatient Hospital Stay (HOSPITAL_COMMUNITY): Payer: Medicare Other

## 2018-05-19 ENCOUNTER — Other Ambulatory Visit: Payer: Self-pay

## 2018-05-19 DIAGNOSIS — R079 Chest pain, unspecified: Secondary | ICD-10-CM

## 2018-05-19 LAB — GLUCOSE, CAPILLARY
GLUCOSE-CAPILLARY: 156 mg/dL — AB (ref 70–99)
GLUCOSE-CAPILLARY: 143 mg/dL — AB (ref 70–99)
GLUCOSE-CAPILLARY: 169 mg/dL — AB (ref 70–99)
GLUCOSE-CAPILLARY: 186 mg/dL — AB (ref 70–99)

## 2018-05-19 LAB — CBC
HCT: 26.1 % — ABNORMAL LOW (ref 36.0–46.0)
HEMOGLOBIN: 8.1 g/dL — AB (ref 12.0–15.0)
MCH: 27.6 pg (ref 26.0–34.0)
MCHC: 31 g/dL (ref 30.0–36.0)
MCV: 88.8 fL (ref 80.0–100.0)
Platelets: 100 10*3/uL — ABNORMAL LOW (ref 150–400)
RBC: 2.94 MIL/uL — AB (ref 3.87–5.11)
RDW: 12.8 % (ref 11.5–15.5)
WBC: 3.2 10*3/uL — ABNORMAL LOW (ref 4.0–10.5)
nRBC: 0 % (ref 0.0–0.2)

## 2018-05-19 LAB — ECHOCARDIOGRAM COMPLETE
Height: 65 in
WEIGHTICAEL: 3244.8 [oz_av]

## 2018-05-19 LAB — HEPARIN LEVEL (UNFRACTIONATED)
HEPARIN UNFRACTIONATED: 0.32 [IU]/mL (ref 0.30–0.70)
HEPARIN UNFRACTIONATED: 0.21 [IU]/mL — AB (ref 0.30–0.70)

## 2018-05-19 MED ORDER — SODIUM CHLORIDE 0.9% FLUSH: 3.0000 mL | INTRAVENOUS | Status: DC | PRN

## 2018-05-19 MED ORDER — SODIUM CHLORIDE 0.9% FLUSH: 3.0000 mL | Freq: Two times a day (BID) | INTRAVENOUS | Status: DC

## 2018-05-19 MED ORDER — NITROGLYCERIN IN D5W 200-5 MCG/ML-% IV SOLN
2.0000 ug/min | INTRAVENOUS | Status: DC
  Administered 2018-05-19: 10 ug/min via INTRAVENOUS
  Administered 2018-05-21: 20 ug/min via INTRAVENOUS
  Filled 2018-05-19 (×2): qty 250

## 2018-05-19 MED ORDER — SODIUM CHLORIDE 0.9 % IV SOLN
INTRAVENOUS | Status: DC
  Administered 2018-05-20: 01:00:00 via INTRAVENOUS

## 2018-05-19 MED ORDER — SODIUM CHLORIDE 0.9 % IV SOLN: 250.0000 mL | INTRAVENOUS | Status: DC | PRN

## 2018-05-19 MED ORDER — ASPIRIN 81 MG PO CHEW
81.0000 mg | CHEWABLE_TABLET | ORAL | Status: AC
  Administered 2018-05-20: 81 mg via ORAL
  Filled 2018-05-19: qty 1

## 2018-05-19 NOTE — Progress Notes (Signed)
Patient had 3 episodes, within a short period of time, of 7/10 Chest pain that radiated to arm and jaw. Was relieved by SL nitro. BP stable after nitro was given. EKG obtained. Patient placed on oxygen. Notified cardiology fellow on call. Orders for nitro gtt to be started.

## 2018-05-19 NOTE — Progress Notes (Signed)
[place] for Heparin  Indication: chest pain/ACS  Allergies  Allergen Reactions  . Carvedilol Other (See Comments)    confusion  . Codeine Other (See Comments)    confusion  . Metoprolol Palpitations    Patient Measurements: Height: 5\' 5"  (165.1 cm) Weight: 202 lb 12.8 oz (92 kg) IBW/kg (Calculated) : 57 Heparin Dosing Weight: 77kg  Vital Signs: Temp: 98.7 F (37.1 C) (11/24 0338) Temp Source: Oral (11/24 0338) BP: 155/65 (11/24 0817) Pulse Rate: 70 (11/24 0338)  Labs: Recent Labs    05/17/18 1925 05/17/18 2105  05/18/18 0104  05/18/18 0542 05/18/18 1342 05/18/18 2305 05/19/18 0323 05/19/18 0836  HGB  --   --    < > 8.9*  --  9.2*  --   --  8.1*  --   HCT  --   --   --  27.8*  --  29.8*  --   --  26.1*  --   PLT  --   --   --  122*  --  120*  --   --  100*  --   LABPROT  --  14.4  --   --   --   --   --   --   --   --   INR  --  1.13  --   --   --   --   --   --   --   --   HEPARINUNFRC  --   --   --   --    < >  --  0.17* 0.17*  --  0.32  CREATININE  --   --   --  2.11*  --  2.20*  --   --   --   --   TROPONINI 0.31*  --   --  0.31*  --  0.20*  --   --   --   --    < > = values in this interval not displayed.    Estimated Creatinine Clearance: 24.8 mL/min (A) (by C-G formula based on SCr of 2.2 mg/dL (H)).   Medical History: Past Medical History:  Diagnosis Date  . Anemia   . Arthritis   . Bilateral pleural effusion    Postoperative, status post Pleurx catheter and talc treatment - Dr. Prescott Gum  . 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 Chronic kidney disease (CKD), stage III (moderate) (HCC)   . Chronic lower back pain   . Essential hypertension   . Fibromyalgia   . History of pneumonia   . Hyperlipidemia   . Type II diabetes mellitus (HCC)     Medications:  Medications Prior to Admission  Medication Sig Dispense  Refill Last Dose  . ACCU-CHEK AVIVA PLUS test strip Inject 1 strip as directed 4 (four) times daily.  2 Taking  . albuterol (PROVENTIL HFA;VENTOLIN HFA) 108 (90 Base) MCG/ACT inhaler Inhale 2 puffs into the lungs every 4 (four) hours as needed for wheezing or shortness of breath. 1 Inhaler 5 Taking  . amLODipine (NORVASC) 10 MG tablet Take 10 mg by mouth daily.   5 Taking  . aspirin EC 81 MG tablet Take 1 tablet (81 mg total) by mouth daily.   Taking  . atorvastatin (LIPITOR) 40 MG tablet Take 1 tablet (40 mg total) by mouth daily at 6  PM.   Taking  . B Complex-C-Folic Acid (DIALYVITE PO) Take 1 tablet by mouth.   Taking  . Cholecalciferol (VITAMIN D3) 2000 units TABS Take by mouth daily.   Taking  . Ferrous Sulfate (IRON) 28 MG TABS Take 1 tablet by mouth daily.   Taking  . furosemide (LASIX) 20 MG tablet Take 20 mg by mouth.   Taking  . GLUCERNA (GLUCERNA) LIQD Take 237 mLs by mouth daily.   Taking  . hydrALAZINE (APRESOLINE) 100 MG tablet Take 100 mg by mouth 3 (three) times daily.   Taking  . insulin glargine (LANTUS) 100 UNIT/ML injection Inject 10-40 Units into the skin at bedtime.    Taking  . insulin lispro (HUMALOG) 100 UNIT/ML KiwkPen Inject 0-12 Units into the skin 3 (three) times daily with meals.    Taking  . Insulin Syringe-Needle U-100 (INSULIN SYRINGE .5CC/30GX5/16") 30G X 5/16" 0.5 ML MISC Inject 0.5 mLs as directed daily.   Taking  . Insulin Syringe-Needle U-100 (INSULIN SYRINGE .5CC/30GX5/16") 30G X 5/16" 0.5 ML MISC Inject 0.5 mLs as directed 3 (three) times daily.  3 Taking  . lisinopril (PRINIVIL,ZESTRIL) 5 MG tablet Take 5 mg by mouth daily.    Taking  . MISC NATURAL PRODUCT OP Take by mouth daily. TerraZyme   Taking  . MISC NATURAL PRODUCTS PO Take by mouth daily. EO Mega   Taking  . MISC NATURAL PRODUCTS PO Take by mouth daily. Alpha CRS   Taking  . MISC NATURAL PRODUCTS PO Take by mouth daily. Micro Plex VM   Taking  . MISC NATURAL PRODUCTS PO Take by mouth daily. Deep  Blue Polyphenol Complex   Taking  . polyethylene glycol (MIRALAX / GLYCOLAX) packet Take 17 g by mouth daily as needed for mild constipation. 14 each 2 Taking  . primidone (MYSOLINE) 50 MG tablet Take 50 mg by mouth daily.   5 Taking  . Tiotropium Bromide-Olodaterol (STIOLTO RESPIMAT) 2.5-2.5 MCG/ACT AERS Take 2 puffs by mouth daily. 1 Inhaler 5   . traMADol (ULTRAM) 50 MG tablet Take 1 tablet (50 mg total) by mouth every 6 (six) hours as needed for moderate pain. (Patient taking differently: Take 50 mg by mouth every 4 (four) hours as needed for moderate pain or severe pain. ) 30 tablet 0 Taking  . vitamin C (ASCORBIC ACID) 500 MG tablet Take 500 mg by mouth daily.   Taking   Scheduled:  . amLODipine  10 mg Oral Daily  . arformoterol  15 mcg Nebulization BID  . aspirin EC  81 mg Oral Daily  . atorvastatin  40 mg Oral q1800  . ferrous sulfate  325 mg Oral Daily  . hydrALAZINE  100 mg Oral Q8H  . insulin aspart  0-20 Units Subcutaneous TID WC  . insulin glargine  80 Units Subcutaneous QHS  . lisinopril  10 mg Oral Daily  . primidone  50 mg Oral Daily  . ticagrelor  90 mg Oral BID  . umeclidinium bromide  1 puff Inhalation Daily  . vitamin C  500 mg Oral Daily    Assessment: 77 yo female with CP to start heparin per pharmacy protocol for chest pain/ACS. No anticoagulants noted PTA.  Heparin level therapeutic at 0.32   Goal of Therapy:  Heparin level 0.3-0.7 units/ml Monitor platelets by anticoagulation protocol: Yes   Plan:  Continue heparin 1350 units/hr Re-checka confirmatory heparin level in 8 hours Monitor hep level, CBCs, and s/s of bleeding   Gwenlyn Found, Pharm D  PGY1 Pharmacy Resident  Phone [phone] 05/19/2018   9:49 AM

## 2018-05-19 NOTE — Progress Notes (Signed)
*  PRELIMINARY RESULTS* Echocardiogram 2D Echocardiogram has been performed.  Samuel Germany 05/19/2018, 2:54 PM

## 2018-05-19 NOTE — Progress Notes (Signed)
Progress Note  Patient Name: Joyce [name] Date of Encounter: 05/19/2018  Primary Cardiologist: Rozann Lesches, MD   Subjective   No additional chest pain.   Inpatient Medications    Scheduled Meds: . amLODipine  10 mg Oral Daily  . arformoterol  15 mcg Nebulization BID  . aspirin EC  81 mg Oral Daily  . atorvastatin  40 mg Oral q1800  . ferrous sulfate  325 mg Oral Daily  . hydrALAZINE  100 mg Oral Q8H  . insulin aspart  0-20 Units Subcutaneous TID WC  . insulin glargine  80 Units Subcutaneous QHS  . lisinopril  10 mg Oral Daily  . primidone  50 mg Oral Daily  . ticagrelor  90 mg Oral BID  . umeclidinium bromide  1 puff Inhalation Daily  . vitamin C  500 mg Oral Daily   Continuous Infusions: . heparin 1,350 Units/hr (05/19/18 0200)   PRN Meds: acetaminophen, albuterol, nitroGLYCERIN, ondansetron (ZOFRAN) IV, polyethylene glycol, traMADol   Vital Signs    Vitals:   05/19/18 0338 05/19/18 0817 05/19/18 0855 05/19/18 0856  BP: (!) 149/51 (!) 155/65    Pulse: 70     Resp: 18     Temp: 98.7 F (37.1 C)     TempSrc: Oral     SpO2: 99%  98% 98%  Weight: 92 kg     Height:        Intake/Output Summary (Last 24 hours) at 05/19/2018 1123 Last data filed at 05/19/2018 1121 Gross per 24 hour  Intake 598.07 ml  Output 2450 ml  Net -1851.93 ml   Filed Weights   05/17/18 1720 05/18/18 0431 05/19/18 0338  Weight: 89.8 kg 91.2 kg 92 kg    Telemetry    nsr - Personally Reviewed  ECG    nsr - Personally Reviewed  Physical Exam   GEN: overweight acute distress.   Neck: No JVD Cardiac: RRR, no murmurs, rubs, or gallops.  Respiratory: Clear to auscultation bilaterally. GI: Soft, nontender, non-distended  MS: No edema; No deformity. Neuro:  Nonfocal  Psych: Normal affect   Labs    Chemistry Recent Labs  Lab 05/18/18 0104 05/18/18 0542  NA 137 136  K 4.4 4.3  CL 106 105  CO2 21* 22  GLUCOSE 119* 104*  BUN 41* 39*  CREATININE 2.11* 2.20*    CALCIUM 8.6* 8.8*  GFRNONAA 22* 21*  GFRAA 25* 24*  ANIONGAP 10 9     Hematology Recent Labs  Lab 05/18/18 0104 05/18/18 0542 05/19/18 0323  WBC 4.3 3.6* 3.2*  RBC 3.13* 3.29* 2.94*  HGB 8.9* 9.2* 8.1*  HCT 27.8* 29.8* 26.1*  MCV 88.8 90.6 88.8  MCH 28.4 28.0 27.6  MCHC 32.0 30.9 31.0  RDW 13.1 13.1 12.8  PLT 122* 120* 100*    Cardiac Enzymes Recent Labs  Lab 05/17/18 1925 05/18/18 0104 05/18/18 0542  TROPONINI 0.31* 0.31* 0.20*   No results for input(s): TROPIPOC in the last 168 hours.   BNPNo results for input(s): BNP, PROBNP in the last 168 hours.   DDimer No results for input(s): DDIMER in the last 168 hours.   Radiology    No results found.  Cardiac Studies   none  Patient Profile     77 y.o. female admitted with [place], now pain free with multiple cardiac risk factors.  Assessment & Plan    1. [place] - her troponin is only minimally elevated but her symptoms are mostly typical. She will undergo left  heart cath with possible PCI. 2. Dyslipidemia - her lipid panel looks good on statin therapy.  For questions or updates, please contact [place] Please consult [email or web address] for contact info under Cardiology/STEMI.      Signed, Cristopher Peru, MD  05/19/2018, 11:23 AM  Patient ID: Joyce [name], female   DOB: [birth year], 77 y.o.   MRN: [id number]

## 2018-05-19 NOTE — Progress Notes (Signed)
[place] for Heparin  Indication: chest pain/ACS  Allergies  Allergen Reactions  . Carvedilol Other (See Comments)    confusion  . Codeine Other (See Comments)    confusion  . Metoprolol Palpitations    Patient Measurements: Height: 5\' 5"  (165.1 cm) Weight: 201 lb (91.2 kg) IBW/kg (Calculated) : 57 Heparin Dosing Weight: 77kg  Vital Signs: Temp: 98.6 F (37 C) (11/23 2107) Temp Source: Oral (11/23 2107) BP: 119/65 (11/23 2107) Pulse Rate: 69 (11/23 2107)  Labs: Recent Labs    05/17/18 1925 05/17/18 2105 05/18/18 0104 05/18/18 0418 05/18/18 0542 05/18/18 1342 05/18/18 2305  HGB  --   --  8.9*  --  9.2*  --   --   HCT  --   --  27.8*  --  29.8*  --   --   PLT  --   --  122*  --  120*  --   --   LABPROT  --  14.4  --   --   --   --   --   INR  --  1.13  --   --   --   --   --   HEPARINUNFRC  --   --   --  0.28*  --  0.17* 0.17*  CREATININE  --   --  2.11*  --  2.20*  --   --   TROPONINI 0.31*  --  0.31*  --  0.20*  --   --     Estimated Creatinine Clearance: 24.7 mL/min (A) (by C-G formula based on SCr of 2.2 mg/dL (H)).   Medical History: Past Medical History:  Diagnosis Date  . Anemia   . Arthritis   . Bilateral pleural effusion    Postoperative, status post Pleurx catheter and talc treatment - Dr. Prescott Gum  . 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 Chronic kidney disease (CKD), stage III (moderate) (HCC)   . Chronic lower back pain   . Essential hypertension   . Fibromyalgia   . History of pneumonia   . Hyperlipidemia   . Type II diabetes mellitus (HCC)     Medications:  Medications Prior to Admission  Medication Sig Dispense Refill Last Dose  . ACCU-CHEK AVIVA PLUS test strip Inject 1 strip as directed 4 (four) times daily.  2 Taking  . albuterol (PROVENTIL HFA;VENTOLIN HFA) 108 (90 Base) MCG/ACT inhaler Inhale 2 puffs  into the lungs every 4 (four) hours as needed for wheezing or shortness of breath. 1 Inhaler 5 Taking  . amLODipine (NORVASC) 10 MG tablet Take 10 mg by mouth daily.   5 Taking  . aspirin EC 81 MG tablet Take 1 tablet (81 mg total) by mouth daily.   Taking  . atorvastatin (LIPITOR) 40 MG tablet Take 1 tablet (40 mg total) by mouth daily at 6 PM.   Taking  . B Complex-C-Folic Acid (DIALYVITE PO) Take 1 tablet by mouth.   Taking  . Cholecalciferol (VITAMIN D3) 2000 units TABS Take by mouth daily.   Taking  . Ferrous Sulfate (IRON) 28 MG TABS Take 1 tablet by mouth daily.   Taking  . furosemide (LASIX) 20 MG tablet Take 20 mg by mouth.   Taking  . GLUCERNA (GLUCERNA) LIQD Take 237 mLs by mouth daily.   Taking  . hydrALAZINE (APRESOLINE) 100  MG tablet Take 100 mg by mouth 3 (three) times daily.   Taking  . insulin glargine (LANTUS) 100 UNIT/ML injection Inject 10-40 Units into the skin at bedtime.    Taking  . insulin lispro (HUMALOG) 100 UNIT/ML KiwkPen Inject 0-12 Units into the skin 3 (three) times daily with meals.    Taking  . Insulin Syringe-Needle U-100 (INSULIN SYRINGE .5CC/30GX5/16") 30G X 5/16" 0.5 ML MISC Inject 0.5 mLs as directed daily.   Taking  . Insulin Syringe-Needle U-100 (INSULIN SYRINGE .5CC/30GX5/16") 30G X 5/16" 0.5 ML MISC Inject 0.5 mLs as directed 3 (three) times daily.  3 Taking  . lisinopril (PRINIVIL,ZESTRIL) 5 MG tablet Take 5 mg by mouth daily.    Taking  . MISC NATURAL PRODUCT OP Take by mouth daily. TerraZyme   Taking  . MISC NATURAL PRODUCTS PO Take by mouth daily. EO Mega   Taking  . MISC NATURAL PRODUCTS PO Take by mouth daily. Alpha CRS   Taking  . MISC NATURAL PRODUCTS PO Take by mouth daily. Micro Plex VM   Taking  . MISC NATURAL PRODUCTS PO Take by mouth daily. Deep Blue Polyphenol Complex   Taking  . polyethylene glycol (MIRALAX / GLYCOLAX) packet Take 17 g by mouth daily as needed for mild constipation. 14 each 2 Taking  . primidone (MYSOLINE) 50 MG tablet  Take 50 mg by mouth daily.   5 Taking  . Tiotropium Bromide-Olodaterol (STIOLTO RESPIMAT) 2.5-2.5 MCG/ACT AERS Take 2 puffs by mouth daily. 1 Inhaler 5   . traMADol (ULTRAM) 50 MG tablet Take 1 tablet (50 mg total) by mouth every 6 (six) hours as needed for moderate pain. (Patient taking differently: Take 50 mg by mouth every 4 (four) hours as needed for moderate pain or severe pain. ) 30 tablet 0 Taking  . vitamin C (ASCORBIC ACID) 500 MG tablet Take 500 mg by mouth daily.   Taking   Scheduled:  . amLODipine  10 mg Oral Daily  . arformoterol  15 mcg Nebulization BID  . aspirin EC  81 mg Oral Daily  . atorvastatin  40 mg Oral q1800  . ferrous sulfate  325 mg Oral Daily  . hydrALAZINE  100 mg Oral Q8H  . insulin aspart  0-20 Units Subcutaneous TID WC  . insulin glargine  80 Units Subcutaneous QHS  . lisinopril  10 mg Oral Daily  . primidone  50 mg Oral Daily  . ticagrelor  90 mg Oral BID  . umeclidinium bromide  1 puff Inhalation Daily  . vitamin C  500 mg Oral Daily    Assessment: 77 yo female with CP to start heparin per pharmacy protocol. No anticoagulants noted PTA.  11/24 AM update: heparin level low tonight, no issues per RN.   Goal of Therapy:  Heparin level 0.3-0.7 units/ml Monitor platelets by anticoagulation protocol: Yes   Plan:  -Inc heparin to 1350 units/hr -Re-check heparin level in 6-8 hours  Narda Bonds, PharmD, [place] Pharmacist Phone: [phone]

## 2018-05-19 NOTE — Progress Notes (Signed)
[place] for Heparin  Indication: chest pain/ACS  Allergies  Allergen Reactions  . Carvedilol Other (See Comments)    confusion  . Codeine Other (See Comments)    confusion  . Metoprolol Palpitations    Patient Measurements: Height: 5\' 5"  (165.1 cm) Weight: 202 lb 12.8 oz (92 kg) IBW/kg (Calculated) : 57 Heparin Dosing Weight: 77kg  Vital Signs: Temp: 98.5 F (36.9 C) (11/24 1344) Temp Source: Oral (11/24 1344) BP: 138/53 (11/24 1852) Pulse Rate: 65 (11/24 1344)  Labs: Recent Labs    05/17/18 1925 05/17/18 2105  05/18/18 0104  05/18/18 0542  05/18/18 2305 05/19/18 0323 05/19/18 0836 05/19/18 1824  HGB  --   --    < > 8.9*  --  9.2*  --   --  8.1*  --   --   HCT  --   --   --  27.8*  --  29.8*  --   --  26.1*  --   --   PLT  --   --   --  122*  --  120*  --   --  100*  --   --   LABPROT  --  14.4  --   --   --   --   --   --   --   --   --   INR  --  1.13  --   --   --   --   --   --   --   --   --   HEPARINUNFRC  --   --   --   --    < >  --    < > 0.17*  --  0.32 0.21*  CREATININE  --   --   --  2.11*  --  2.20*  --   --   --   --   --   TROPONINI 0.31*  --   --  0.31*  --  0.20*  --   --   --   --   --    < > = values in this interval not displayed.    Estimated Creatinine Clearance: 24.8 mL/min (A) (by C-G formula based on SCr of 2.2 mg/dL (H)).   Medical History: Past Medical History:  Diagnosis Date  . Anemia   . Arthritis   . Bilateral pleural effusion    Postoperative, status post Pleurx catheter and talc treatment - Dr. Prescott Gum  . 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 Chronic kidney disease (CKD), stage III (moderate) (HCC)   . Chronic lower back pain   . Essential hypertension   . Fibromyalgia   . History of pneumonia   . Hyperlipidemia   . Type II diabetes mellitus (HCC)     Medications:  Medications Prior  to Admission  Medication Sig Dispense Refill Last Dose  . albuterol (PROVENTIL HFA;VENTOLIN HFA) 108 (90 Base) MCG/ACT inhaler Inhale 2 puffs into the lungs every 4 (four) hours as needed for wheezing or shortness of breath. 1 Inhaler 5 Past Week at Unknown time  . amLODipine (NORVASC) 10 MG tablet Take 10 mg by mouth daily.   5 Past Week at Unknown time  . aspirin EC 81 MG tablet Take 1 tablet (81 mg total) by mouth daily.   Past Week at Unknown  time  . atorvastatin (LIPITOR) 40 MG tablet Take 1 tablet (40 mg total) by mouth daily at 6 PM.   Past Week at Unknown time  . B Complex-C-Folic Acid (DIALYVITE PO) Take 1 tablet by mouth daily.    Past Week at Unknown time  . busPIRone (BUSPAR) 5 MG tablet Take 5 mg by mouth 3 (three) times daily.   Past Week at Unknown time  . Cholecalciferol (VITAMIN D3) 2000 units TABS Take 2,000 Units by mouth daily.    Past Week at Unknown time  . Ferrous Sulfate (IRON) 28 MG TABS Take 28 mg by mouth daily.    Past Week at Unknown time  . furosemide (LASIX) 20 MG tablet Take 20-40 mg by mouth See admin instructions. Take 1 tablet (40mg ) on Sun Tues Thurs Sat, then on all other days Mon Wed Fri take 1/2 tablet (20mg )   Past Week at Unknown time  . GLUCERNA (GLUCERNA) LIQD Take 237 mLs by mouth daily.   Past Week at Unknown time  . hydrALAZINE (APRESOLINE) 100 MG tablet Take 100 mg by mouth 3 (three) times daily.   Past Week at Unknown time  . insulin glargine (LANTUS) 100 UNIT/ML injection Inject 40 Units into the skin at bedtime.    Past Week at Unknown time  . insulin lispro (HUMALOG) 100 UNIT/ML KiwkPen Inject 8 Units into the skin 3 (three) times daily with meals.    Past Week at Unknown time  . lisinopril (PRINIVIL,ZESTRIL) 5 MG tablet Take 5 mg by mouth daily.    Past Week at Unknown time  . polyethylene glycol (MIRALAX / GLYCOLAX) packet Take 17 g by mouth daily as needed for mild constipation. 14 each 2 unknown  . primidone (MYSOLINE) 50 MG tablet Take 50 mg by  mouth daily.   5 Past Week at Unknown time  . Tiotropium Bromide-Olodaterol (STIOLTO RESPIMAT) 2.5-2.5 MCG/ACT AERS Take 2 puffs by mouth daily. 1 Inhaler 5 Past Week at Unknown time  . traMADol (ULTRAM) 50 MG tablet Take 1 tablet (50 mg total) by mouth every 6 (six) hours as needed for moderate pain. (Patient taking differently: Take 50 mg by mouth every 4 (four) hours as needed for moderate pain or severe pain. ) 30 tablet 0 Past Week at Unknown time  . vitamin C (ASCORBIC ACID) 500 MG tablet Take 500 mg by mouth daily.   Past Week at Unknown time  . ACCU-CHEK AVIVA PLUS test strip Inject 1 strip as directed 4 (four) times daily.  2 Taking  . Insulin Syringe-Needle U-100 (INSULIN SYRINGE .5CC/30GX5/16") 30G X 5/16" 0.5 ML MISC Inject 0.5 mLs as directed daily.   Taking  . Insulin Syringe-Needle U-100 (INSULIN SYRINGE .5CC/30GX5/16") 30G X 5/16" 0.5 ML MISC Inject 0.5 mLs as directed 3 (three) times daily.  3 Taking   Scheduled:  . amLODipine  10 mg Oral Daily  . arformoterol  15 mcg Nebulization BID  . [START ON 05/20/2018] aspirin  81 mg Oral Pre-Cath  . aspirin EC  81 mg Oral Daily  . atorvastatin  40 mg Oral q1800  . ferrous sulfate  325 mg Oral Daily  . hydrALAZINE  100 mg Oral Q8H  . insulin aspart  0-20 Units Subcutaneous TID WC  . insulin glargine  80 Units Subcutaneous QHS  . lisinopril  10 mg Oral Daily  . primidone  50 mg Oral Daily  . sodium chloride flush  3 mL Intravenous Q12H  . ticagrelor  90 mg Oral BID  .  umeclidinium bromide  1 puff Inhalation Daily  . vitamin C  500 mg Oral Daily    Assessment: 77 yo female with CP to start heparin per pharmacy protocol for chest pain/ACS. No anticoagulants noted PTA.  Heparin level came back subtherapeutic at 0.21, on 1350 units/hr. Hgb 8.1 (down slightly), plt 100. No s/sx of bleeding. No infusion issues.   Goal of Therapy:  Heparin level 0.3-0.7 units/ml Monitor platelets by anticoagulation protocol: Yes   Plan:  Increase  heparin 1500 units/hr Re-check heparin level in 8 hours Monitor hep level, CBCs, and s/s of bleeding   Antonietta Jewel, PharmD Clinical Pharmacist  Pager: [phone] Phone: [phone] 05/19/2018   7:13 PM

## 2018-05-20 ENCOUNTER — Encounter (HOSPITAL_COMMUNITY)
Admission: AD | Disposition: A | Discharge status: Home / Self Care | Payer: Self-pay | Source: Other Acute Inpatient Hospital | Attending: Cardiology

## 2018-05-20 DIAGNOSIS — D649 Anemia, unspecified: Secondary | ICD-10-CM

## 2018-05-20 DIAGNOSIS — I2510 Atherosclerotic heart disease of native coronary artery without angina pectoris: Secondary | ICD-10-CM

## 2018-05-20 HISTORY — PX: LEFT HEART CATH AND CORS/GRAFTS ANGIOGRAPHY: CATH118250

## 2018-05-20 LAB — CBC
HEMATOCRIT: 26.4 % — AB (ref 36.0–46.0)
Hemoglobin: 8.3 g/dL — ABNORMAL LOW (ref 12.0–15.0)
MCH: 28.1 pg (ref 26.0–34.0)
MCHC: 31.4 g/dL (ref 30.0–36.0)
MCV: 89.5 fL (ref 80.0–100.0)
Platelets: 101 10*3/uL — ABNORMAL LOW (ref 150–400)
RBC: 2.95 MIL/uL — ABNORMAL LOW (ref 3.87–5.11)
RDW: 12.8 % (ref 11.5–15.5)
WBC: 2.9 10*3/uL — AB (ref 4.0–10.5)
nRBC: 0 % (ref 0.0–0.2)

## 2018-05-20 LAB — MRSA PCR SCREENING: MRSA by PCR: NEGATIVE

## 2018-05-20 LAB — BASIC METABOLIC PANEL
Anion gap: 6 (ref 5–15)
BUN: 42 mg/dL — AB (ref 8–23)
CO2: 22 mmol/L (ref 22–32)
CREATININE: 1.9 mg/dL — AB (ref 0.44–1.00)
Calcium: 8.7 mg/dL — ABNORMAL LOW (ref 8.9–10.3)
Chloride: 111 mmol/L (ref 98–111)
GFR calc Af Amer: 29 mL/min — ABNORMAL LOW (ref >60)
GFR, EST NON AFRICAN AMERICAN: 25 mL/min — AB (ref >60)
Glucose, Bld: 161 mg/dL — ABNORMAL HIGH (ref 70–99)
Potassium: 4.7 mmol/L (ref 3.5–5.1)
SODIUM: 139 mmol/L (ref 135–145)

## 2018-05-20 LAB — GLUCOSE, CAPILLARY
GLUCOSE-CAPILLARY: 175 mg/dL — AB (ref 70–99)
Glucose-Capillary: 124 mg/dL — ABNORMAL HIGH (ref 70–99)
Glucose-Capillary: 108 mg/dL — ABNORMAL HIGH (ref 70–99)

## 2018-05-20 LAB — OCCULT BLOOD X 1 CARD TO LAB, STOOL: Fecal Occult Bld: NEGATIVE

## 2018-05-20 LAB — HEPARIN LEVEL (UNFRACTIONATED): HEPARIN UNFRACTIONATED: 0.41 [IU]/mL (ref 0.30–0.70)

## 2018-05-20 SURGERY — LEFT HEART CATH AND CORS/GRAFTS ANGIOGRAPHY
Anesthesia: LOCAL

## 2018-05-20 MED ORDER — ONDANSETRON HCL 4 MG/2ML IJ SOLN: 4.0000 mg | Freq: Four times a day (QID) | INTRAMUSCULAR | Status: DC | PRN

## 2018-05-20 MED ORDER — ALPRAZOLAM 0.5 MG PO TABS
0.5000 mg | ORAL_TABLET | Freq: Three times a day (TID) | ORAL | Status: DC | PRN
  Administered 2018-05-20 – 2018-05-24 (×4): 0.5 mg via ORAL
  Filled 2018-05-20 (×4): qty 1

## 2018-05-20 MED ORDER — LIDOCAINE HCL (PF) 1 % IJ SOLN
INTRAMUSCULAR | Status: AC
  Filled 2018-05-20: qty 30

## 2018-05-20 MED ORDER — MIDAZOLAM HCL 2 MG/2ML IJ SOLN
INTRAMUSCULAR | Status: AC
  Filled 2018-05-20: qty 2

## 2018-05-20 MED ORDER — HEPARIN (PORCINE) IN NACL 1000-0.9 UT/500ML-% IV SOLN
INTRAVENOUS | Status: AC
  Filled 2018-05-20: qty 1000

## 2018-05-20 MED ORDER — FLUTICASONE PROPIONATE 50 MCG/ACT NA SUSP
2.0000 | Freq: Every day | NASAL | Status: DC
  Administered 2018-05-20 – 2018-05-26 (×6): 2 via NASAL
  Filled 2018-05-20: qty 16

## 2018-05-20 MED ORDER — FENTANYL CITRATE (PF) 100 MCG/2ML IJ SOLN
INTRAMUSCULAR | Status: AC
  Filled 2018-05-20: qty 2

## 2018-05-20 MED ORDER — SODIUM CHLORIDE 0.9 % IV SOLN: 250.0000 mL | INTRAVENOUS | Status: DC | PRN

## 2018-05-20 MED ORDER — FENTANYL CITRATE (PF) 100 MCG/2ML IJ SOLN
INTRAMUSCULAR | Status: DC | PRN
  Administered 2018-05-20: 25 ug via INTRAVENOUS

## 2018-05-20 MED ORDER — HYDRALAZINE HCL 20 MG/ML IJ SOLN
INTRAMUSCULAR | Status: DC | PRN
  Administered 2018-05-20: 10 mg via INTRAVENOUS

## 2018-05-20 MED ORDER — FUROSEMIDE 10 MG/ML IJ SOLN
INTRAMUSCULAR | Status: AC
  Filled 2018-05-20: qty 4

## 2018-05-20 MED ORDER — LIDOCAINE HCL (PF) 1 % IJ SOLN
INTRAMUSCULAR | Status: DC | PRN
  Administered 2018-05-20: 15 mL

## 2018-05-20 MED ORDER — SODIUM CHLORIDE 0.9% FLUSH: 3.0000 mL | INTRAVENOUS | Status: DC | PRN

## 2018-05-20 MED ORDER — FUROSEMIDE 10 MG/ML IJ SOLN
INTRAMUSCULAR | Status: DC | PRN
  Administered 2018-05-20: 40 mg via INTRAVENOUS

## 2018-05-20 MED ORDER — MIDAZOLAM HCL 2 MG/2ML IJ SOLN
INTRAMUSCULAR | Status: DC | PRN
  Administered 2018-05-20: 1 mg via INTRAVENOUS

## 2018-05-20 MED ORDER — HEPARIN (PORCINE) IN NACL 1000-0.9 UT/500ML-% IV SOLN
INTRAVENOUS | Status: DC | PRN
  Administered 2018-05-20 (×2): 500 mL

## 2018-05-20 MED ORDER — SODIUM CHLORIDE 0.9% FLUSH
3.0000 mL | Freq: Two times a day (BID) | INTRAVENOUS | Status: DC
  Administered 2018-05-20 – 2018-05-21 (×2): 3 mL via INTRAVENOUS

## 2018-05-20 MED ORDER — HYDRALAZINE HCL 20 MG/ML IJ SOLN
INTRAMUSCULAR | Status: AC
  Filled 2018-05-20: qty 1

## 2018-05-20 MED ORDER — ACETAMINOPHEN 325 MG PO TABS: 650.0000 mg | ORAL_TABLET | ORAL | Status: DC | PRN

## 2018-05-20 MED ORDER — SODIUM CHLORIDE 0.9 % IV SOLN
INTRAVENOUS | Status: DC
  Administered 2018-05-20: 10:00:00 via INTRAVENOUS

## 2018-05-20 MED ORDER — CLOPIDOGREL BISULFATE 75 MG PO TABS
75.0000 mg | ORAL_TABLET | Freq: Every day | ORAL | Status: DC
  Administered 2018-05-21 – 2018-05-22 (×2): 75 mg via ORAL
  Filled 2018-05-20 (×2): qty 1

## 2018-05-20 MED ORDER — ASPIRIN 81 MG PO CHEW
81.0000 mg | CHEWABLE_TABLET | Freq: Every day | ORAL | Status: DC
  Administered 2018-05-21 – 2018-05-26 (×6): 81 mg via ORAL
  Filled 2018-05-20 (×7): qty 1

## 2018-05-20 MED ORDER — IOHEXOL 350 MG/ML SOLN
INTRAVENOUS | Status: DC | PRN
  Administered 2018-05-20: 100 mL

## 2018-05-20 MED ORDER — HEPARIN (PORCINE) 25000 UT/250ML-% IV SOLN
1500.0000 [IU]/h | INTRAVENOUS | Status: DC
  Administered 2018-05-20 – 2018-05-22 (×3): 1500 [IU]/h via INTRAVENOUS
  Filled 2018-05-20 (×2): qty 250

## 2018-05-20 MED ORDER — TICAGRELOR 90 MG PO TABS
90.0000 mg | ORAL_TABLET | Freq: Two times a day (BID) | ORAL | Status: AC
  Administered 2018-05-20: 90 mg via ORAL
  Filled 2018-05-20: qty 1

## 2018-05-20 MED ORDER — SODIUM CHLORIDE 0.9 % IV SOLN: INTRAVENOUS | Status: AC

## 2018-05-20 SURGICAL SUPPLY — 10 items
CATH INFINITI 5 FR LCB (CATHETERS) ×1 IMPLANT
CATH INFINITI 5FR AL1 (CATHETERS) ×1 IMPLANT
CATH INFINITI 5FR MULTPACK ANG (CATHETERS) ×1 IMPLANT
CLOSURE MYNX CONTROL 5F (Vascular Products) ×1 IMPLANT
KIT HEART LEFT (KITS) ×2 IMPLANT
PACK CARDIAC CATHETERIZATION (CUSTOM PROCEDURE TRAY) ×2 IMPLANT
SHEATH PINNACLE 5F 10CM (SHEATH) ×1 IMPLANT
TRANSDUCER W/STOPCOCK (MISCELLANEOUS) ×2 IMPLANT
TUBING CIL FLEX 10 FLL-RA (TUBING) ×2 IMPLANT
WIRE EMERALD 3MM-J .035X150CM (WIRE) ×1 IMPLANT

## 2018-05-20 NOTE — Progress Notes (Signed)
[place] for Heparin  Indication: chest pain/ACS  Allergies  Allergen Reactions  . Carvedilol Other (See Comments)    confusion  . Codeine Other (See Comments)    confusion  . Metoprolol Palpitations    Patient Measurements: Height: 5\' 5"  (165.1 cm) Weight: 201 lb 12.8 oz (91.5 kg) IBW/kg (Calculated) : 57 Heparin Dosing Weight: 77kg  Vital Signs: Temp: 98.5 F (36.9 C) (11/25 0818) Temp Source: Oral (11/25 0818) BP: 156/61 (11/25 0818) Pulse Rate: 69 (11/25 0818)  Labs: Recent Labs    05/17/18 1925 05/17/18 2105  05/18/18 0104  05/18/18 0542  05/19/18 0323 05/19/18 0836 05/19/18 1824 05/20/18 0417  HGB  --   --    < > 8.9*  --  9.2*  --  8.1*  --   --  8.3*  HCT  --   --    < > 27.8*  --  29.8*  --  26.1*  --   --  26.4*  PLT  --   --    < > 122*  --  120*  --  100*  --   --  101*  LABPROT  --  14.4  --   --   --   --   --   --   --   --   --   INR  --  1.13  --   --   --   --   --   --   --   --   --   HEPARINUNFRC  --   --   --   --    < >  --    < >  --  0.32 0.21* 0.41  CREATININE  --   --   --  2.11*  --  2.20*  --   --   --   --  1.90*  TROPONINI 0.31*  --   --  0.31*  --  0.20*  --   --   --   --   --    < > = values in this interval not displayed.    Estimated Creatinine Clearance: 28.6 mL/min (A) (by C-G formula based on SCr of 1.9 mg/dL (H)).   Medical History: Past Medical History:  Diagnosis Date  . Anemia   . Arthritis   . Bilateral pleural effusion    Postoperative, status post Pleurx catheter and talc treatment - Dr. Prescott Gum  . 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 Chronic kidney disease (CKD), stage III (moderate) (HCC)   . Chronic lower back pain   . Essential hypertension   . Fibromyalgia   . History of pneumonia   . Hyperlipidemia   . Type II diabetes mellitus (HCC)     Medications:  Medications  Prior to Admission  Medication Sig Dispense Refill Last Dose  . albuterol (PROVENTIL HFA;VENTOLIN HFA) 108 (90 Base) MCG/ACT inhaler Inhale 2 puffs into the lungs every 4 (four) hours as needed for wheezing or shortness of breath. 1 Inhaler 5 Past Week at Unknown time  . amLODipine (NORVASC) 10 MG tablet Take 10 mg by mouth daily.   5 Past Week at Unknown time  . aspirin EC 81 MG tablet Take 1 tablet (81 mg total) by mouth daily.   Past Week at Unknown time  . atorvastatin (LIPITOR) 40  MG tablet Take 1 tablet (40 mg total) by mouth daily at 6 PM.   Past Week at Unknown time  . B Complex-C-Folic Acid (DIALYVITE PO) Take 1 tablet by mouth daily.    Past Week at Unknown time  . busPIRone (BUSPAR) 5 MG tablet Take 5 mg by mouth 3 (three) times daily.   Past Week at Unknown time  . Cholecalciferol (VITAMIN D3) 2000 units TABS Take 2,000 Units by mouth daily.    Past Week at Unknown time  . Ferrous Sulfate (IRON) 28 MG TABS Take 28 mg by mouth daily.    Past Week at Unknown time  . furosemide (LASIX) 20 MG tablet Take 20-40 mg by mouth See admin instructions. Take 1 tablet (40mg ) on Sun Tues Thurs Sat, then on all other days Mon Wed Fri take 1/2 tablet (20mg )   Past Week at Unknown time  . GLUCERNA (GLUCERNA) LIQD Take 237 mLs by mouth daily.   Past Week at Unknown time  . hydrALAZINE (APRESOLINE) 100 MG tablet Take 100 mg by mouth 3 (three) times daily.   Past Week at Unknown time  . insulin glargine (LANTUS) 100 UNIT/ML injection Inject 40 Units into the skin at bedtime.    Past Week at Unknown time  . insulin lispro (HUMALOG) 100 UNIT/ML KiwkPen Inject 8 Units into the skin 3 (three) times daily with meals.    Past Week at Unknown time  . lisinopril (PRINIVIL,ZESTRIL) 5 MG tablet Take 5 mg by mouth daily.    Past Week at Unknown time  . polyethylene glycol (MIRALAX / GLYCOLAX) packet Take 17 g by mouth daily as needed for mild constipation. 14 each 2 unknown  . primidone (MYSOLINE) 50 MG tablet Take 50  mg by mouth daily.   5 Past Week at Unknown time  . Tiotropium Bromide-Olodaterol (STIOLTO RESPIMAT) 2.5-2.5 MCG/ACT AERS Take 2 puffs by mouth daily. 1 Inhaler 5 Past Week at Unknown time  . traMADol (ULTRAM) 50 MG tablet Take 1 tablet (50 mg total) by mouth every 6 (six) hours as needed for moderate pain. (Patient taking differently: Take 50 mg by mouth every 4 (four) hours as needed for moderate pain or severe pain. ) 30 tablet 0 Past Week at Unknown time  . vitamin C (ASCORBIC ACID) 500 MG tablet Take 500 mg by mouth daily.   Past Week at Unknown time  . ACCU-CHEK AVIVA PLUS test strip Inject 1 strip as directed 4 (four) times daily.  2 Taking  . Insulin Syringe-Needle U-100 (INSULIN SYRINGE .5CC/30GX5/16") 30G X 5/16" 0.5 ML MISC Inject 0.5 mLs as directed daily.   Taking  . Insulin Syringe-Needle U-100 (INSULIN SYRINGE .5CC/30GX5/16") 30G X 5/16" 0.5 ML MISC Inject 0.5 mLs as directed 3 (three) times daily.  3 Taking   Scheduled:  . amLODipine  10 mg Oral Daily  . arformoterol  15 mcg Nebulization BID  . aspirin EC  81 mg Oral Daily  . atorvastatin  40 mg Oral q1800  . ferrous sulfate  325 mg Oral Daily  . hydrALAZINE  100 mg Oral Q8H  . insulin aspart  0-20 Units Subcutaneous TID WC  . insulin glargine  80 Units Subcutaneous QHS  . primidone  50 mg Oral Daily  . sodium chloride flush  3 mL Intravenous Q12H  . ticagrelor  90 mg Oral BID  . umeclidinium bromide  1 puff Inhalation Daily  . vitamin C  500 mg Oral Daily    Assessment: 77 yo female  with CP to start heparin per pharmacy protocol for chest pain/ACS. No anticoagulants noted PTA. Plans noted for cath -Hg= 8.3, plt= 101 (hx low plt)  Goal of Therapy:  Heparin level 0.3-0.7 units/ml Monitor platelets by anticoagulation protocol: Yes   Plan:  -No heparin changes needed -Daily CBC and heparin level  Hildred Laser, PharmD Clinical Pharmacist **Pharmacist phone directory can now be found on [email or web address] ([email or web address]).  Listed  under [place].

## 2018-05-20 NOTE — Care Management (Signed)
#     5.   S/W  [facility]   @   [facility]  # [phone]   TICAGRELOR  : NONE FORMULARY   BRILINTA  90 MG BID COVER- YES CO-PAY- $ 20.71 TIER-- 3 DRUG PRIOR APPROVAL- NO  PREFERRED PHARMACY: YES [facility], [facility], [facility] AND [facility]

## 2018-05-20 NOTE — Progress Notes (Signed)
[id number] MI education began with pt and son who voiced understanding. Reviewed NTG use, MI restrictions, gave diabetic and heart healthy diets, discussed importance of brilitna and discussed CRP 2. Will refer to [facility] after pt has her procedure. Will follow up tomorrow. Graylon Good RN BSN 05/20/2018 11:44 AM

## 2018-05-20 NOTE — Progress Notes (Signed)
Report is being called and pt has been placed on transport monitor.

## 2018-05-20 NOTE — Interval H&P Note (Signed)
Cath Lab Visit (complete for each Cath Lab visit)  Clinical Evaluation Leading to the Procedure:   ACS: Yes.    Non-ACS:    Anginal Classification: CCS III  Anti-ischemic medical therapy: Minimal Therapy (1 class of medications)  Non-Invasive Test Results: No non-invasive testing performed  Prior CABG: Previous CABG      History and Physical Interval Note:  05/20/2018 4:27 PM  Joyce [name]  has presented today for surgery, with the diagnosis of NSTEMI  The various methods of treatment have been discussed with the patient and family. After consideration of risks, benefits and other options for treatment, the patient has consented to  Procedure(s): LEFT HEART CATH AND CORONARY ANGIOGRAPHY (N/A) as a surgical intervention .  The patient's history has been reviewed, patient examined, no change in status, stable for surgery.  I have reviewed the patient's chart and labs.  Questions were answered to the patient's satisfaction.     Quay Burow

## 2018-05-20 NOTE — H&P (View-Only) (Signed)
Progress Note  Patient Name: Joyce [name] Date of Encounter: 05/20/2018  Primary Cardiologist:   Rozann Lesches, MD   Subjective   Recurrent chest pain last night relieved with SLNTG.  Currently pain free.    Inpatient Medications    Scheduled Meds: . amLODipine  10 mg Oral Daily  . arformoterol  15 mcg Nebulization BID  . aspirin EC  81 mg Oral Daily  . atorvastatin  40 mg Oral q1800  . ferrous sulfate  325 mg Oral Daily  . hydrALAZINE  100 mg Oral Q8H  . insulin aspart  0-20 Units Subcutaneous TID WC  . insulin glargine  80 Units Subcutaneous QHS  . lisinopril  10 mg Oral Daily  . primidone  50 mg Oral Daily  . sodium chloride flush  3 mL Intravenous Q12H  . ticagrelor  90 mg Oral BID  . umeclidinium bromide  1 puff Inhalation Daily  . vitamin C  500 mg Oral Daily   Continuous Infusions: . sodium chloride    . sodium chloride 100 mL/hr at 05/20/18 0410  . heparin 1,500 Units/hr (05/20/18 0410)  . nitroGLYCERIN 15 mcg/min (05/20/18 0708)   PRN Meds: sodium chloride, acetaminophen, albuterol, nitroGLYCERIN, ondansetron (ZOFRAN) IV, polyethylene glycol, sodium chloride flush, traMADol   Vital Signs    Vitals:   05/20/18 0021 05/20/18 0446 05/20/18 0738 05/20/18 0818  BP: 116/69   (!) 156/61  Pulse: 62   69  Resp: 18     Temp:    98.5 F (36.9 C)  TempSrc:    Oral  SpO2: 98%  99% 99%  Weight:  91.5 kg    Height:        Intake/Output Summary (Last 24 hours) at 05/20/2018 0829 Last data filed at 05/20/2018 0817 Gross per 24 hour  Intake 1256.11 ml  Output 2600 ml  Net -1343.89 ml   Filed Weights   05/18/18 0431 05/19/18 0338 05/20/18 0446  Weight: 91.2 kg 92 kg 91.5 kg    Telemetry    NSR - Personally Reviewed  ECG    NA - Personally Reviewed  Physical Exam   GEN: No acute distress.   Neck: No  JVD Cardiac: RRR, soft apical systolic murmurs, rubs, or gallops.  Respiratory: Clear  to auscultation bilaterally. GI: Soft, nontender,  non-distended  MS: No  edema; No deformity. Neuro:  Nonfocal  Psych: Normal affect   Labs    Chemistry Recent Labs  Lab 05/18/18 0104 05/18/18 0542 05/20/18 0417  NA 137 136 139  K 4.4 4.3 4.7  CL 106 105 111  CO2 21* 22 22  GLUCOSE 119* 104* 161*  BUN 41* 39* 42*  CREATININE 2.11* 2.20* 1.90*  CALCIUM 8.6* 8.8* 8.7*  GFRNONAA 22* 21* 25*  GFRAA 25* 24* 29*  ANIONGAP 10 9 6      Hematology Recent Labs  Lab 05/18/18 0542 05/19/18 0323 05/20/18 0417  WBC 3.6* 3.2* 2.9*  RBC 3.29* 2.94* 2.95*  HGB 9.2* 8.1* 8.3*  HCT 29.8* 26.1* 26.4*  MCV 90.6 88.8 89.5  MCH 28.0 27.6 28.1  MCHC 30.9 31.0 31.4  RDW 13.1 12.8 12.8  PLT 120* 100* 101*    Cardiac Enzymes Recent Labs  Lab 05/17/18 1925 05/18/18 0104 05/18/18 0542  TROPONINI 0.31* 0.31* 0.20*   No results for input(s): TROPIPOC in the last 168 hours.   BNPNo results for input(s): BNP, PROBNP in the last 168 hours.   DDimer No results for input(s): DDIMER in the last  168 hours.   Radiology    No results found.  Cardiac Studies   NA  Patient Profile     77 y.o. female with a history of multivessel CAD s/p CABG x4 in 2015, hypertension, hyperlipidemia, type 2 diabetes mellitus, and CKD stage III who was transferred from [facility] to [facility] for NSTEMI.   Assessment & Plan    NSTEMI:  Cath today pending result below.    Pt is currently on Brilinta Started this admission.     ANEMIA:    Hgb was 11.1 on Nov 1.  However, she does not report any active bleeding.  I will check a stool guaiac.  She could have the cath if guaiac is negative given the fact that she continues to have symptoms and trop is elevated.   CKD III:   Creat is improved today.  This is chronically elevated.  OK to cath with reduced dye.    Appropriately hydrated.    HTN:   BP is elevated.  Hold ACE inhibitor for now.    DYSLIPIDEMIA:   LDL is at goal on moderate dose statin.  continue current therapy.     DM:   Continue  current therapy.  She reports that the last A1C was 5.6.   For questions or updates, please contact [place] Please consult [email or web address] for contact info under Cardiology/STEMI.   Signed, Minus Breeding, MD  05/20/2018, 8:29 AM

## 2018-05-20 NOTE — Progress Notes (Signed)
Pharmacy note  There is drug interaction with Brilinta and primidone (increase Brilinta metabolism and a decreased antiplatelet effect)  Plan -Spoke with Dr. Percival Spanish, will change to plavix beginning 11/26  Hildred Laser, PharmD Clinical Pharmacist **Pharmacist phone directory can now be found on [email or web address] ([email or web address]).  Listed under [place].   e

## 2018-05-20 NOTE — Progress Notes (Signed)
Progress Note  Patient Name: Joyce [name] Date of Encounter: 05/20/2018  Primary Cardiologist:   Rozann Lesches, MD   Subjective   Recurrent chest pain last night relieved with SLNTG.  Currently pain free.    Inpatient Medications    Scheduled Meds: . amLODipine  10 mg Oral Daily  . arformoterol  15 mcg Nebulization BID  . aspirin EC  81 mg Oral Daily  . atorvastatin  40 mg Oral q1800  . ferrous sulfate  325 mg Oral Daily  . hydrALAZINE  100 mg Oral Q8H  . insulin aspart  0-20 Units Subcutaneous TID WC  . insulin glargine  80 Units Subcutaneous QHS  . lisinopril  10 mg Oral Daily  . primidone  50 mg Oral Daily  . sodium chloride flush  3 mL Intravenous Q12H  . ticagrelor  90 mg Oral BID  . umeclidinium bromide  1 puff Inhalation Daily  . vitamin C  500 mg Oral Daily   Continuous Infusions: . sodium chloride    . sodium chloride 100 mL/hr at 05/20/18 0410  . heparin 1,500 Units/hr (05/20/18 0410)  . nitroGLYCERIN 15 mcg/min (05/20/18 0708)   PRN Meds: sodium chloride, acetaminophen, albuterol, nitroGLYCERIN, ondansetron (ZOFRAN) IV, polyethylene glycol, sodium chloride flush, traMADol   Vital Signs    Vitals:   05/20/18 0021 05/20/18 0446 05/20/18 0738 05/20/18 0818  BP: 116/69   (!) 156/61  Pulse: 62   69  Resp: 18     Temp:    98.5 F (36.9 C)  TempSrc:    Oral  SpO2: 98%  99% 99%  Weight:  91.5 kg    Height:        Intake/Output Summary (Last 24 hours) at 05/20/2018 0829 Last data filed at 05/20/2018 0817 Gross per 24 hour  Intake 1256.11 ml  Output 2600 ml  Net -1343.89 ml   Filed Weights   05/18/18 0431 05/19/18 0338 05/20/18 0446  Weight: 91.2 kg 92 kg 91.5 kg    Telemetry    NSR - Personally Reviewed  ECG    NA - Personally Reviewed  Physical Exam   GEN: No acute distress.   Neck: No  JVD Cardiac: RRR, soft apical systolic murmurs, rubs, or gallops.  Respiratory: Clear  to auscultation bilaterally. GI: Soft, nontender,  non-distended  MS: No  edema; No deformity. Neuro:  Nonfocal  Psych: Normal affect   Labs    Chemistry Recent Labs  Lab 05/18/18 0104 05/18/18 0542 05/20/18 0417  NA 137 136 139  K 4.4 4.3 4.7  CL 106 105 111  CO2 21* 22 22  GLUCOSE 119* 104* 161*  BUN 41* 39* 42*  CREATININE 2.11* 2.20* 1.90*  CALCIUM 8.6* 8.8* 8.7*  GFRNONAA 22* 21* 25*  GFRAA 25* 24* 29*  ANIONGAP 10 9 6      Hematology Recent Labs  Lab 05/18/18 0542 05/19/18 0323 05/20/18 0417  WBC 3.6* 3.2* 2.9*  RBC 3.29* 2.94* 2.95*  HGB 9.2* 8.1* 8.3*  HCT 29.8* 26.1* 26.4*  MCV 90.6 88.8 89.5  MCH 28.0 27.6 28.1  MCHC 30.9 31.0 31.4  RDW 13.1 12.8 12.8  PLT 120* 100* 101*    Cardiac Enzymes Recent Labs  Lab 05/17/18 1925 05/18/18 0104 05/18/18 0542  TROPONINI 0.31* 0.31* 0.20*   No results for input(s): TROPIPOC in the last 168 hours.   BNPNo results for input(s): BNP, PROBNP in the last 168 hours.   DDimer No results for input(s): DDIMER in the last  168 hours.   Radiology    No results found.  Cardiac Studies   NA  Patient Profile     77 y.o. female with a history of multivessel CAD s/p CABG x4 in 2015, hypertension, hyperlipidemia, type 2 diabetes mellitus, and CKD stage III who was transferred from [facility] to [facility] for NSTEMI.   Assessment & Plan    NSTEMI:  Cath today pending result below.    Pt is currently on Brilinta Started this admission.     ANEMIA:    Hgb was 11.1 on Nov 1.  However, she does not report any active bleeding.  I will check a stool guaiac.  She could have the cath if guaiac is negative given the fact that she continues to have symptoms and trop is elevated.   CKD III:   Creat is improved today.  This is chronically elevated.  OK to cath with reduced dye.    Appropriately hydrated.    HTN:   BP is elevated.  Hold ACE inhibitor for now.    DYSLIPIDEMIA:   LDL is at goal on moderate dose statin.  continue current therapy.     DM:   Continue  current therapy.  She reports that the last A1C was 5.6.   For questions or updates, please contact [place] Please consult [email or web address] for contact info under Cardiology/STEMI.   Signed, Minus Breeding, MD  05/20/2018, 8:29 AM

## 2018-05-20 NOTE — Progress Notes (Signed)
ANTICOAGULATION CONSULT NOTE   Pharmacy Consult for Heparin  Indication: Occluded grafts - staged intervention  Allergies  Allergen Reactions  . Carvedilol Other (See Comments)    confusion  . Codeine Other (See Comments)    confusion  . Metoprolol Palpitations    Patient Measurements: Height: 5\' 5"  (165.1 cm) Weight: 201 lb 12.8 oz (91.5 kg) IBW/kg (Calculated) : 57 Heparin Dosing Weight: 77kg  Vital Signs: Temp: 98.3 F (36.8 C) (11/25 1151) Temp Source: Oral (11/25 1151) BP: 197/67 (11/25 1750) Pulse Rate: 80 (11/25 1750)  Labs: Recent Labs    05/17/18 1925 05/17/18 2105  05/18/18 0104  05/18/18 0542  05/19/18 0323 05/19/18 0836 05/19/18 1824 05/20/18 0417  HGB  --   --    < > 8.9*  --  9.2*  --  8.1*  --   --  8.3*  HCT  --   --    < > 27.8*  --  29.8*  --  26.1*  --   --  26.4*  PLT  --   --    < > 122*  --  120*  --  100*  --   --  101*  LABPROT  --  14.4  --   --   --   --   --   --   --   --   --   INR  --  1.13  --   --   --   --   --   --   --   --   --   HEPARINUNFRC  --   --   --   --    < >  --    < >  --  0.32 0.21* 0.41  CREATININE  --   --   --  2.11*  --  2.20*  --   --   --   --  1.90*  TROPONINI 0.31*  --   --  0.31*  --  0.20*  --   --   --   --   --    < > = values in this interval not displayed.    Estimated Creatinine Clearance: 28.6 mL/min (A) (by C-G formula based on SCr of 1.9 mg/dL (H)).  Assessment: 77 yo female s/p cath which showed occluded vein grafts. MD to do staged intervention. Pharmacy consulted to restart heparin 4 hours post sheath removal. Sheath removed ~1710.  Pt with therapeutic heparin level on 1500 units/hr this a.m.   Goal of Therapy:  Heparin level 0.3-0.7 units/ml Monitor platelets by anticoagulation protocol: Yes   Plan:  - At 2115, restart heparin 1500 units/hr - F/u 8 hr heparin level - Daily heparin level and CBC  Sherlon Handing, PharmD, BCPS Clinical pharmacist  **Pharmacist phone directory can now be  found on [email or web address] ([email or web address]).  Listed under [place]. 05/20/2018 6:22 PM

## 2018-05-21 ENCOUNTER — Encounter (HOSPITAL_COMMUNITY): Payer: Self-pay | Admitting: Cardiovascular Disease

## 2018-05-21 LAB — GLUCOSE, CAPILLARY
GLUCOSE-CAPILLARY: 94 mg/dL (ref 70–99)
GLUCOSE-CAPILLARY: 142 mg/dL — AB (ref 70–99)
Glucose-Capillary: 189 mg/dL — ABNORMAL HIGH (ref 70–99)
Glucose-Capillary: 95 mg/dL (ref 70–99)
Glucose-Capillary: 145 mg/dL — ABNORMAL HIGH (ref 70–99)

## 2018-05-21 LAB — BASIC METABOLIC PANEL
Anion gap: 6 (ref 5–15)
BUN: 34 mg/dL — ABNORMAL HIGH (ref 8–23)
CALCIUM: 8.7 mg/dL — AB (ref 8.9–10.3)
CO2: 22 mmol/L (ref 22–32)
CREATININE: 1.72 mg/dL — AB (ref 0.44–1.00)
Chloride: 111 mmol/L (ref 98–111)
GFR calc non Af Amer: 29 mL/min — ABNORMAL LOW (ref >60)
GFR, EST AFRICAN AMERICAN: 33 mL/min — AB (ref >60)
Glucose, Bld: 104 mg/dL — ABNORMAL HIGH (ref 70–99)
Potassium: 4.2 mmol/L (ref 3.5–5.1)
SODIUM: 139 mmol/L (ref 135–145)

## 2018-05-21 LAB — CBC
HEMATOCRIT: 26.2 % — AB (ref 36.0–46.0)
Hemoglobin: 8.1 g/dL — ABNORMAL LOW (ref 12.0–15.0)
MCH: 28 pg (ref 26.0–34.0)
MCHC: 30.9 g/dL (ref 30.0–36.0)
MCV: 90.7 fL (ref 80.0–100.0)
PLATELETS: 109 10*3/uL — AB (ref 150–400)
RBC: 2.89 MIL/uL — ABNORMAL LOW (ref 3.87–5.11)
RDW: 12.9 % (ref 11.5–15.5)
WBC: 3.6 10*3/uL — ABNORMAL LOW (ref 4.0–10.5)
nRBC: 0 % (ref 0.0–0.2)

## 2018-05-21 LAB — HEPARIN LEVEL (UNFRACTIONATED)
Heparin Unfractionated: 0.4 IU/mL (ref 0.30–0.70)
Heparin Unfractionated: 0.43 IU/mL (ref 0.30–0.70)

## 2018-05-21 MED ORDER — SODIUM CHLORIDE 0.9 % WEIGHT BASED INFUSION: 1.0000 mL/kg/h | INTRAVENOUS | Status: DC

## 2018-05-21 MED ORDER — SODIUM CHLORIDE 0.9% FLUSH
3.0000 mL | Freq: Two times a day (BID) | INTRAVENOUS | Status: DC
  Administered 2018-05-21: 3 mL via INTRAVENOUS

## 2018-05-21 MED ORDER — SODIUM CHLORIDE 0.9 % IV SOLN: 250.0000 mL | INTRAVENOUS | Status: DC | PRN

## 2018-05-21 MED ORDER — SODIUM CHLORIDE 0.9 % WEIGHT BASED INFUSION: 3.0000 mL/kg/h | INTRAVENOUS | Status: DC

## 2018-05-21 MED ORDER — SODIUM CHLORIDE 0.9 % WEIGHT BASED INFUSION
3.0000 mL/kg/h | INTRAVENOUS | Status: DC
  Administered 2018-05-22: 3 mL/kg/h via INTRAVENOUS

## 2018-05-21 MED ORDER — SODIUM CHLORIDE 0.9% FLUSH: 3.0000 mL | INTRAVENOUS | Status: DC | PRN

## 2018-05-21 NOTE — Progress Notes (Signed)
Patient consented for PCI in the morning. Orders for 0.9%NS based on previous weight of 91.5kg. Patient's new weight is 92.2kg. Text paged cardiology and received verbal instructions to base NS administration on new weight. Will continue to monitor. Lajoyce Corners, RN

## 2018-05-21 NOTE — H&P (View-Only) (Signed)
Progress Note  Patient Name: Joyce [name] Date of Encounter: 05/21/2018  Primary Cardiologist:   Rozann Lesches, MD   Subjective   No pain.  No SOB.   Inpatient Medications    Scheduled Meds: . amLODipine  10 mg Oral Daily  . arformoterol  15 mcg Nebulization BID  . aspirin  81 mg Oral Daily  . atorvastatin  40 mg Oral q1800  . clopidogrel  75 mg Oral Daily  . ferrous sulfate  325 mg Oral Daily  . fluticasone  2 spray Each Nare Daily  . hydrALAZINE  100 mg Oral Q8H  . insulin aspart  0-20 Units Subcutaneous TID WC  . insulin glargine  80 Units Subcutaneous QHS  . primidone  50 mg Oral Daily  . sodium chloride flush  3 mL Intravenous Q12H  . umeclidinium bromide  1 puff Inhalation Daily  . vitamin C  500 mg Oral Daily   Continuous Infusions: . sodium chloride Stopped (05/20/18 1824)  . sodium chloride    . heparin 1,500 Units/hr (05/21/18 0942)  . nitroGLYCERIN 20 mcg/min (05/21/18 0800)   PRN Meds: sodium chloride, acetaminophen, albuterol, ALPRAZolam, nitroGLYCERIN, ondansetron (ZOFRAN) IV, polyethylene glycol, sodium chloride flush, traMADol   Vital Signs    Vitals:   05/21/18 0628 05/21/18 0700 05/21/18 0800 05/21/18 0900  BP:  (!) 141/58 (!) 148/102 140/73  Pulse: 62 71 77 67  Resp: 15 17 17 19   Temp:  97.7 F (36.5 C)    TempSrc:  Oral    SpO2: 97% 95% 96% 98%  Weight:      Height:        Intake/Output Summary (Last 24 hours) at 05/21/2018 1025 Last data filed at 05/21/2018 0800 Gross per 24 hour  Intake 1048.27 ml  Output 900 ml  Net 148.27 ml   Filed Weights   05/18/18 0431 05/19/18 0338 05/20/18 0446  Weight: 91.2 kg 92 kg 91.5 kg    Telemetry    NSR - Personally Reviewed  ECG    NA - Personally Reviewed  Physical Exam   GEN: No acute distress.   Neck: No  JVD Cardiac: RRR, no murmurs, rubs, or gallops.  Respiratory: Clear  to auscultation bilaterally. GI: Soft, nontender, non-distended  MS: No  edema; No deformity.   Right groin without hematoma or echymosis Neuro:  Nonfocal  Psych: Normal affect   Labs    Chemistry Recent Labs  Lab 05/18/18 0542 05/20/18 0417 05/21/18 0538  NA 136 139 139  K 4.3 4.7 4.2  CL 105 111 111  CO2 22 22 22   GLUCOSE 104* 161* 104*  BUN 39* 42* 34*  CREATININE 2.20* 1.90* 1.72*  CALCIUM 8.8* 8.7* 8.7*  GFRNONAA 21* 25* 29*  GFRAA 24* 29* 33*  ANIONGAP 9 6 6      Hematology Recent Labs  Lab 05/19/18 0323 05/20/18 0417 05/21/18 0538  WBC 3.2* 2.9* 3.6*  RBC 2.94* 2.95* 2.89*  HGB 8.1* 8.3* 8.1*  HCT 26.1* 26.4* 26.2*  MCV 88.8 89.5 90.7  MCH 27.6 28.1 28.0  MCHC 31.0 31.4 30.9  RDW 12.8 12.8 12.9  PLT 100* 101* 109*    Cardiac Enzymes Recent Labs  Lab 05/17/18 1925 05/18/18 0104 05/18/18 0542  TROPONINI 0.31* 0.31* 0.20*   No results for input(s): TROPIPOC in the last 168 hours.   BNPNo results for input(s): BNP, PROBNP in the last 168 hours.   DDimer No results for input(s): DDIMER in the last 168 hours.  Radiology    No results found.  Cardiac Studies   Cath   Prox LAD to Mid LAD lesion is 100% stenosed.  Origin lesion is 100% stenosed.  Origin to Prox Graft lesion is 100% stenosed.  Origin to Prox Graft lesion is 100% stenosed.  Ramus lesion is 95% stenosed.  Prox Cx to Mid Cx lesion is 90% stenosed.  Prox LAD lesion is 80% stenosed.  Prox RCA lesion is 90% stenosed.  Mid RCA lesion is 99% stenosed.  Dist RCA lesion is 60% stenosed.   Diagnostic Diagram          Patient Profile     77 y.o. female with a history of multivessel CAD s/p CABG x4 in 2015, hypertension, hyperlipidemia, type 2 diabetes mellitus, and CKD stage III who was transferred from [facility] to [facility] for NSTEMI.  Status post cath with results as above.   Assessment & Plan    NSTEMI:   Cath with results as above.  I reviewed these.  Plan PCI of RCA in the AM.  Possible intervention of the RI but I will discuss further with Dr.  Tamala Julian who is on to do the procedure tomorrow. Continue current therapy.    ANEMIA:    Hgb was 11.1 on Nov 1.   Guaiac negative yesterday with stable Hgb.  We will need to follow.     CKD III:    Creat is stable.  Check again in the AM.     HTN:   BP is  OK.  Continue to hold ACE inhibitor.   DYSLIPIDEMIA:   LDL is at goal on moderate dose statin. given the extent of disease I will increase to high dose.   DM:     She reports that the last A1C was 5.6.   No change in therapy.    For questions or updates, please contact [place] Please consult [email or web address] for contact info under Cardiology/STEMI.   Signed, Minus Breeding, MD  05/21/2018, 10:25 AM

## 2018-05-21 NOTE — Progress Notes (Signed)
Progress Note  Patient Name: Joyce [name] Date of Encounter: 05/21/2018  Primary Cardiologist:   Rozann Lesches, MD   Subjective   No pain.  No SOB.   Inpatient Medications    Scheduled Meds: . amLODipine  10 mg Oral Daily  . arformoterol  15 mcg Nebulization BID  . aspirin  81 mg Oral Daily  . atorvastatin  40 mg Oral q1800  . clopidogrel  75 mg Oral Daily  . ferrous sulfate  325 mg Oral Daily  . fluticasone  2 spray Each Nare Daily  . hydrALAZINE  100 mg Oral Q8H  . insulin aspart  0-20 Units Subcutaneous TID WC  . insulin glargine  80 Units Subcutaneous QHS  . primidone  50 mg Oral Daily  . sodium chloride flush  3 mL Intravenous Q12H  . umeclidinium bromide  1 puff Inhalation Daily  . vitamin C  500 mg Oral Daily   Continuous Infusions: . sodium chloride Stopped (05/20/18 1824)  . sodium chloride    . heparin 1,500 Units/hr (05/21/18 0942)  . nitroGLYCERIN 20 mcg/min (05/21/18 0800)   PRN Meds: sodium chloride, acetaminophen, albuterol, ALPRAZolam, nitroGLYCERIN, ondansetron (ZOFRAN) IV, polyethylene glycol, sodium chloride flush, traMADol   Vital Signs    Vitals:   05/21/18 0628 05/21/18 0700 05/21/18 0800 05/21/18 0900  BP:  (!) 141/58 (!) 148/102 140/73  Pulse: 62 71 77 67  Resp: 15 17 17 19   Temp:  97.7 F (36.5 C)    TempSrc:  Oral    SpO2: 97% 95% 96% 98%  Weight:      Height:        Intake/Output Summary (Last 24 hours) at 05/21/2018 1025 Last data filed at 05/21/2018 0800 Gross per 24 hour  Intake 1048.27 ml  Output 900 ml  Net 148.27 ml   Filed Weights   05/18/18 0431 05/19/18 0338 05/20/18 0446  Weight: 91.2 kg 92 kg 91.5 kg    Telemetry    NSR - Personally Reviewed  ECG    NA - Personally Reviewed  Physical Exam   GEN: No acute distress.   Neck: No  JVD Cardiac: RRR, no murmurs, rubs, or gallops.  Respiratory: Clear  to auscultation bilaterally. GI: Soft, nontender, non-distended  MS: No  edema; No deformity.   Right groin without hematoma or echymosis Neuro:  Nonfocal  Psych: Normal affect   Labs    Chemistry Recent Labs  Lab 05/18/18 0542 05/20/18 0417 05/21/18 0538  NA 136 139 139  K 4.3 4.7 4.2  CL 105 111 111  CO2 22 22 22   GLUCOSE 104* 161* 104*  BUN 39* 42* 34*  CREATININE 2.20* 1.90* 1.72*  CALCIUM 8.8* 8.7* 8.7*  GFRNONAA 21* 25* 29*  GFRAA 24* 29* 33*  ANIONGAP 9 6 6      Hematology Recent Labs  Lab 05/19/18 0323 05/20/18 0417 05/21/18 0538  WBC 3.2* 2.9* 3.6*  RBC 2.94* 2.95* 2.89*  HGB 8.1* 8.3* 8.1*  HCT 26.1* 26.4* 26.2*  MCV 88.8 89.5 90.7  MCH 27.6 28.1 28.0  MCHC 31.0 31.4 30.9  RDW 12.8 12.8 12.9  PLT 100* 101* 109*    Cardiac Enzymes Recent Labs  Lab 05/17/18 1925 05/18/18 0104 05/18/18 0542  TROPONINI 0.31* 0.31* 0.20*   No results for input(s): TROPIPOC in the last 168 hours.   BNPNo results for input(s): BNP, PROBNP in the last 168 hours.   DDimer No results for input(s): DDIMER in the last 168 hours.  Radiology    No results found.  Cardiac Studies   Cath   Prox LAD to Mid LAD lesion is 100% stenosed.  Origin lesion is 100% stenosed.  Origin to Prox Graft lesion is 100% stenosed.  Origin to Prox Graft lesion is 100% stenosed.  Ramus lesion is 95% stenosed.  Prox Cx to Mid Cx lesion is 90% stenosed.  Prox LAD lesion is 80% stenosed.  Prox RCA lesion is 90% stenosed.  Mid RCA lesion is 99% stenosed.  Dist RCA lesion is 60% stenosed.   Diagnostic Diagram          Patient Profile     77 y.o. female with a history of multivessel CAD s/p CABG x4 in 2015, hypertension, hyperlipidemia, type 2 diabetes mellitus, and CKD stage III who was transferred from [facility] to [facility] for NSTEMI.  Status post cath with results as above.   Assessment & Plan    NSTEMI:   Cath with results as above.  I reviewed these.  Plan PCI of RCA in the AM.  Possible intervention of the RI but I will discuss further with Dr.  Tamala Julian who is on to do the procedure tomorrow. Continue current therapy.    ANEMIA:    Hgb was 11.1 on Nov 1.   Guaiac negative yesterday with stable Hgb.  We will need to follow.     CKD III:    Creat is stable.  Check again in the AM.     HTN:   BP is  OK.  Continue to hold ACE inhibitor.   DYSLIPIDEMIA:   LDL is at goal on moderate dose statin. given the extent of disease I will increase to high dose.   DM:     She reports that the last A1C was 5.6.   No change in therapy.    For questions or updates, please contact [place] Please consult [email or web address] for contact info under Cardiology/STEMI.   Signed, Minus Breeding, MD  05/21/2018, 10:25 AM

## 2018-05-21 NOTE — Progress Notes (Addendum)
1130-1150 Gave pt walking instructions for home since she is going for PCI tomorrow and cardiac rehab is not here on Thursday. Referring to [facility] CRP 2. If pt here on Friday, will follow up for ambulation.  Needs to see case manager re brilinta .Marland KitchenGraylon Good RN BSN 05/21/2018 11:50 AM

## 2018-05-21 NOTE — Progress Notes (Addendum)
Patient arrived to 4E room 04 at this time. Telemetry applied and CCMD notified. V/s and assessment done. CHG bath given. Patient oriented to room and how to call nurse with any needs.   Emelda Fear, RN

## 2018-05-21 NOTE — Progress Notes (Signed)
ANTICOAGULATION CONSULT NOTE   Pharmacy Consult for Heparin  Indication: Occluded grafts - staged intervention  Allergies  Allergen Reactions  . Carvedilol Other (See Comments)    confusion  . Codeine Other (See Comments)    confusion  . Metoprolol Palpitations    Patient Measurements: Height: 5\' 5"  (165.1 cm) Weight: 201 lb 12.8 oz (91.5 kg) IBW/kg (Calculated) : 57 Heparin Dosing Weight: 77kg  Vital Signs: Temp: 98.7 F (37.1 C) (11/26 0400) Temp Source: Oral (11/26 0400) BP: 144/68 (11/26 0602) Pulse Rate: 62 (11/26 0628)  Labs: Recent Labs    05/19/18 0323  05/19/18 1824 05/20/18 0417 05/21/18 0538  HGB 8.1*  --   --  8.3*  --   HCT 26.1*  --   --  26.4*  --   PLT 100*  --   --  101*  --   HEPARINUNFRC  --    < > 0.21* 0.41 0.40  CREATININE  --   --   --  1.90*  --    < > = values in this interval not displayed.    Estimated Creatinine Clearance: 28.6 mL/min (A) (by C-G formula based on SCr of 1.9 mg/dL (H)).  Assessment: 77 yo female s/p cath which showed occluded vein grafts. MD to do staged intervention. Pharmacy consulted to restart heparin 4 hours post sheath removal. Sheath removed ~1710.   11/26 AM update: heparin level therapeutic x 1 after re-start s/p cath  Goal of Therapy:  Heparin level 0.3-0.7 units/ml Monitor platelets by anticoagulation protocol: Yes   Plan:  -Cont heparin at 1500 units/hr -1200 confirmatory heparin level  Narda Bonds, PharmD, BCPS Clinical Pharmacist Phone: [phone]

## 2018-05-21 NOTE — Progress Notes (Signed)
ANTICOAGULATION CONSULT NOTE   Pharmacy Consult for Heparin  Indication: Occluded grafts - staged intervention  Allergies  Allergen Reactions  . Carvedilol Other (See Comments)    confusion  . Codeine Other (See Comments)    confusion  . Metoprolol Palpitations    Patient Measurements: Height: 5\' 5"  (165.1 cm) Weight: 201 lb 12.8 oz (91.5 kg) IBW/kg (Calculated) : 57 Heparin Dosing Weight: 77kg  Vital Signs: Temp: 98.8 F (37.1 C) (11/26 1200) Temp Source: Oral (11/26 1200) BP: 140/73 (11/26 0900) Pulse Rate: 73 (11/26 1200)  Labs: Recent Labs    05/19/18 0323  05/20/18 0417 05/21/18 0538 05/21/18 1224  HGB 8.1*  --  8.3* 8.1*  --   HCT 26.1*  --  26.4* 26.2*  --   PLT 100*  --  101* 109*  --   HEPARINUNFRC  --    < > 0.41 0.40 0.43  CREATININE  --   --  1.90* 1.72*  --    < > = values in this interval not displayed.    Estimated Creatinine Clearance: 31.6 mL/min (A) (by C-G formula based on SCr of 1.72 mg/dL (H)).  Assessment: 77 yo female s/p cath which showed occluded vein grafts. MD to do staged intervention. Pharmacy consulted to restart heparin 4 hours post sheath removal. Sheath removed ~1710.  Heparin level remains therapeutic this afternoon.  CBC stable, no bleeding or complications noted.  Goal of Therapy:  Heparin level 0.3-0.7 units/ml Monitor platelets by anticoagulation protocol: Yes   Plan:  - Continue heparin 1500 units/hr - Daily heparin level and CBC  [place] Crane, Roylene Reason, [facility] Clinical Pharmacist Phone [phone]  05/21/2018 1:22 PM

## 2018-05-21 NOTE — Progress Notes (Addendum)
Patient up to bathroom and c/o chest pain 6/10 that radiated to jaw and shoulder. EKG done. Showed NSR. Before EKG complete, patient stated that chest pain was gone. BP 172/66, HR 74 and 95%  O2 room air. Dr. Marlou Porch paged and new orders received. Will continue to monitor.  Emelda Fear, RN

## 2018-05-22 ENCOUNTER — Inpatient Hospital Stay (HOSPITAL_COMMUNITY)
Admission: AD | Disposition: A | Discharge status: Home / Self Care | Payer: Self-pay | Source: Other Acute Inpatient Hospital | Attending: Cardiology

## 2018-05-22 DIAGNOSIS — N183 Chronic kidney disease, stage 3 (moderate): Secondary | ICD-10-CM

## 2018-05-22 DIAGNOSIS — E785 Hyperlipidemia, unspecified: Secondary | ICD-10-CM

## 2018-05-22 HISTORY — PX: CORONARY ATHERECTOMY: CATH118238

## 2018-05-22 HISTORY — PX: CORONARY STENT INTERVENTION: CATH118234

## 2018-05-22 LAB — RETICULOCYTES
Immature Retic Fract: 10.3 % (ref 2.3–15.9)
RBC.: 2.63 MIL/uL — ABNORMAL LOW (ref 3.87–5.11)
Retic Count, Absolute: 80.7 10*3/uL (ref 19.0–186.0)
Retic Ct Pct: 3.1 % (ref 0.4–3.1)

## 2018-05-22 LAB — GLUCOSE, CAPILLARY
GLUCOSE-CAPILLARY: 64 mg/dL — AB (ref 70–99)
GLUCOSE-CAPILLARY: 92 mg/dL (ref 70–99)
GLUCOSE-CAPILLARY: 67 mg/dL — AB (ref 70–99)
GLUCOSE-CAPILLARY: 111 mg/dL — AB (ref 70–99)
Glucose-Capillary: 90 mg/dL (ref 70–99)
Glucose-Capillary: 94 mg/dL (ref 70–99)

## 2018-05-22 LAB — CBC
HCT: 25.9 % — ABNORMAL LOW (ref 36.0–46.0)
HEMATOCRIT: 26.6 % — AB (ref 36.0–46.0)
HEMOGLOBIN: 8 g/dL — AB (ref 12.0–15.0)
HEMOGLOBIN: 8.5 g/dL — AB (ref 12.0–15.0)
MCH: 27.5 pg (ref 26.0–34.0)
MCH: 28.7 pg (ref 26.0–34.0)
MCHC: 30.9 g/dL (ref 30.0–36.0)
MCHC: 32 g/dL (ref 30.0–36.0)
MCV: 89 fL (ref 80.0–100.0)
MCV: 89.9 fL (ref 80.0–100.0)
NRBC: 0 % (ref 0.0–0.2)
Platelets: 111 10*3/uL — ABNORMAL LOW (ref 150–400)
Platelets: 104 10*3/uL — ABNORMAL LOW (ref 150–400)
RBC: 2.91 MIL/uL — AB (ref 3.87–5.11)
RBC: 2.96 MIL/uL — ABNORMAL LOW (ref 3.87–5.11)
RDW: 12.8 % (ref 11.5–15.5)
RDW: 12.9 % (ref 11.5–15.5)
WBC: 4.4 10*3/uL (ref 4.0–10.5)
WBC: 3.9 10*3/uL — ABNORMAL LOW (ref 4.0–10.5)
nRBC: 0 % (ref 0.0–0.2)

## 2018-05-22 LAB — BASIC METABOLIC PANEL
Anion gap: 6 (ref 5–15)
BUN: 33 mg/dL — AB (ref 8–23)
CALCIUM: 8.9 mg/dL (ref 8.9–10.3)
CO2: 21 mmol/L — ABNORMAL LOW (ref 22–32)
CREATININE: 1.76 mg/dL — AB (ref 0.44–1.00)
Chloride: 112 mmol/L — ABNORMAL HIGH (ref 98–111)
GFR calc non Af Amer: 28 mL/min — ABNORMAL LOW (ref >60)
GFR, EST AFRICAN AMERICAN: 32 mL/min — AB (ref >60)
Glucose, Bld: 82 mg/dL (ref 70–99)
Potassium: 4.2 mmol/L (ref 3.5–5.1)
SODIUM: 139 mmol/L (ref 135–145)

## 2018-05-22 LAB — HEPARIN LEVEL (UNFRACTIONATED): HEPARIN UNFRACTIONATED: 0.43 [IU]/mL (ref 0.30–0.70)

## 2018-05-22 LAB — POCT ACTIVATED CLOTTING TIME
ACTIVATED CLOTTING TIME: 164 s
Activated Clotting Time: 340 seconds
Activated Clotting Time: 235 seconds
Activated Clotting Time: 202 seconds

## 2018-05-22 SURGERY — CORONARY ATHERECTOMY
Anesthesia: LOCAL

## 2018-05-22 MED ORDER — MIDAZOLAM HCL 2 MG/2ML IJ SOLN
INTRAMUSCULAR | Status: AC
  Filled 2018-05-22: qty 2

## 2018-05-22 MED ORDER — FENTANYL CITRATE (PF) 100 MCG/2ML IJ SOLN
INTRAMUSCULAR | Status: DC | PRN
  Administered 2018-05-22: 50 ug via INTRAVENOUS
  Administered 2018-05-22: 25 ug via INTRAVENOUS

## 2018-05-22 MED ORDER — HEPARIN SODIUM (PORCINE) 5000 UNIT/ML IJ SOLN
5000.0000 [IU] | Freq: Three times a day (TID) | INTRAMUSCULAR | Status: DC
  Administered 2018-05-23 – 2018-05-26 (×8): 5000 [IU] via SUBCUTANEOUS
  Filled 2018-05-22 (×9): qty 1

## 2018-05-22 MED ORDER — SODIUM CHLORIDE 0.9% FLUSH: 3.0000 mL | INTRAVENOUS | Status: DC | PRN

## 2018-05-22 MED ORDER — CLOPIDOGREL BISULFATE 75 MG PO TABS
ORAL_TABLET | ORAL | Status: AC
  Filled 2018-05-22: qty 3

## 2018-05-22 MED ORDER — SODIUM CHLORIDE 0.9 % IV SOLN
INTRAVENOUS | Status: DC | PRN
  Administered 2018-05-22: 1.75 mg/kg/h via INTRAVENOUS

## 2018-05-22 MED ORDER — FENTANYL CITRATE (PF) 100 MCG/2ML IJ SOLN
INTRAMUSCULAR | Status: AC
  Filled 2018-05-22: qty 2

## 2018-05-22 MED ORDER — LIDOCAINE HCL (PF) 1 % IJ SOLN
INTRAMUSCULAR | Status: DC | PRN
  Administered 2018-05-22 (×2): 15 mL

## 2018-05-22 MED ORDER — SODIUM CHLORIDE 0.9% FLUSH
3.0000 mL | Freq: Two times a day (BID) | INTRAVENOUS | Status: DC
  Administered 2018-05-23 – 2018-05-25 (×5): 3 mL via INTRAVENOUS

## 2018-05-22 MED ORDER — CLOPIDOGREL BISULFATE 300 MG PO TABS
ORAL_TABLET | ORAL | Status: DC | PRN
  Administered 2018-05-22: 225 mg via ORAL

## 2018-05-22 MED ORDER — LIDOCAINE HCL (PF) 1 % IJ SOLN
INTRAMUSCULAR | Status: AC
  Filled 2018-05-22: qty 30

## 2018-05-22 MED ORDER — NITROGLYCERIN IN D5W 200-5 MCG/ML-% IV SOLN
60.0000 ug/min | INTRAVENOUS | Status: DC
  Administered 2018-05-22: 14:00:00 60 ug/min via INTRAVENOUS

## 2018-05-22 MED ORDER — SODIUM CHLORIDE 0.9 % IV SOLN: 250.0000 mL | INTRAVENOUS | Status: DC | PRN

## 2018-05-22 MED ORDER — ACETAMINOPHEN 325 MG PO TABS: 650.0000 mg | ORAL_TABLET | ORAL | Status: DC | PRN

## 2018-05-22 MED ORDER — HEPARIN (PORCINE) IN NACL 1000-0.9 UT/500ML-% IV SOLN
INTRAVENOUS | Status: AC
  Filled 2018-05-22: qty 1000

## 2018-05-22 MED ORDER — HYDROMORPHONE HCL 1 MG/ML IJ SOLN
INTRAMUSCULAR | Status: AC
  Filled 2018-05-22: qty 0.5

## 2018-05-22 MED ORDER — HYDRALAZINE HCL 20 MG/ML IJ SOLN: 5.0000 mg | INTRAMUSCULAR | Status: AC | PRN

## 2018-05-22 MED ORDER — INSULIN GLARGINE 100 UNIT/ML ~~LOC~~ SOLN
40.0000 [IU] | Freq: Every day | SUBCUTANEOUS | Status: DC
  Administered 2018-05-22 – 2018-05-24 (×3): 40 [IU] via SUBCUTANEOUS
  Administered 2018-05-25: 30 [IU] via SUBCUTANEOUS
  Filled 2018-05-22 (×4): qty 0.4

## 2018-05-22 MED ORDER — DEXTROSE 50 % IV SOLN
INTRAVENOUS | Status: AC
  Administered 2018-05-22: 25 mL
  Filled 2018-05-22: qty 50

## 2018-05-22 MED ORDER — ONDANSETRON HCL 4 MG/2ML IJ SOLN: 4.0000 mg | Freq: Four times a day (QID) | INTRAMUSCULAR | Status: DC | PRN

## 2018-05-22 MED ORDER — HYDROMORPHONE HCL 1 MG/ML IJ SOLN
INTRAMUSCULAR | Status: DC | PRN
  Administered 2018-05-22 (×4): 0.5 mg via INTRAVENOUS

## 2018-05-22 MED ORDER — NITROGLYCERIN 1 MG/10 ML FOR IR/CATH LAB
INTRA_ARTERIAL | Status: DC | PRN
  Administered 2018-05-22 (×3): 100 ug via INTRACORONARY

## 2018-05-22 MED ORDER — BIVALIRUDIN TRIFLUOROACETATE 250 MG IV SOLR
INTRAVENOUS | Status: AC
  Filled 2018-05-22: qty 250

## 2018-05-22 MED ORDER — ASPIRIN 81 MG PO CHEW: 81.0000 mg | CHEWABLE_TABLET | Freq: Every day | ORAL | Status: DC

## 2018-05-22 MED ORDER — HEART ATTACK BOUNCING BOOK
Freq: Once | Status: AC
  Administered 2018-05-22: 23:00:00 1
  Filled 2018-05-22: qty 1

## 2018-05-22 MED ORDER — BIVALIRUDIN BOLUS VIA INFUSION - CUPID
INTRAVENOUS | Status: DC | PRN
  Administered 2018-05-22: 69.15 mg via INTRAVENOUS

## 2018-05-22 MED ORDER — SODIUM CHLORIDE 0.9 % IV SOLN
INTRAVENOUS | Status: AC | PRN
  Administered 2018-05-22 (×2): 10 mL/h via INTRAVENOUS

## 2018-05-22 MED ORDER — ANGIOPLASTY BOOK
Freq: Once | Status: DC
  Filled 2018-05-22: qty 1

## 2018-05-22 MED ORDER — HEPARIN (PORCINE) IN NACL 1000-0.9 UT/500ML-% IV SOLN
INTRAVENOUS | Status: AC
  Filled 2018-05-22: qty 500

## 2018-05-22 MED ORDER — CLOPIDOGREL BISULFATE 75 MG PO TABS
75.0000 mg | ORAL_TABLET | Freq: Every day | ORAL | Status: DC
  Administered 2018-05-23 – 2018-05-26 (×4): 75 mg via ORAL
  Filled 2018-05-22 (×4): qty 1

## 2018-05-22 MED ORDER — LABETALOL HCL 5 MG/ML IV SOLN: 10.0000 mg | INTRAVENOUS | Status: AC | PRN

## 2018-05-22 MED ORDER — SODIUM CHLORIDE 0.9 % WEIGHT BASED INFUSION
1.0000 mL/kg/h | INTRAVENOUS | Status: AC
  Administered 2018-05-22: 1 mL/kg/h via INTRAVENOUS

## 2018-05-22 MED ORDER — VIPERSLIDE LUBRICANT OPTIME
TOPICAL | Status: DC | PRN
  Administered 2018-05-22: 10:00:00 via SURGICAL_CAVITY

## 2018-05-22 MED ORDER — NITROGLYCERIN 1 MG/10 ML FOR IR/CATH LAB
INTRA_ARTERIAL | Status: AC
  Filled 2018-05-22: qty 10

## 2018-05-22 MED ORDER — MIDAZOLAM HCL 2 MG/2ML IJ SOLN
INTRAMUSCULAR | Status: DC | PRN
  Administered 2018-05-22: 1 mg via INTRAVENOUS
  Administered 2018-05-22: 0.5 mg via INTRAVENOUS
  Administered 2018-05-22: 1 mg via INTRAVENOUS
  Administered 2018-05-22: 0.5 mg via INTRAVENOUS

## 2018-05-22 SURGICAL SUPPLY — 29 items
BALLN SAPPHIRE 2.75X15 (BALLOONS) ×2
BALLN SAPPHIRE ~~LOC~~ 3.0X18 (BALLOONS) ×1 IMPLANT
BALLN ~~LOC~~ EMERGE MR 3.75X15 (BALLOONS) ×2
BALLOON SAPPHIRE 2.75X15 (BALLOONS) IMPLANT
BALLOON ~~LOC~~ EMERGE MR 3.75X15 (BALLOONS) IMPLANT
CABLE ADAPT CONN TEMP 6FT (ADAPTER) ×1 IMPLANT
CATH S G BIP PACING (SET/KITS/TRAYS/PACK) ×1 IMPLANT
CATH TELEPORT (CATHETERS) ×1 IMPLANT
CATH VISTA GUIDE 6FR JR4 (CATHETERS) ×2 IMPLANT
CROWN DIAMONDBACK CLASSIC 1.25 (BURR) ×1 IMPLANT
ELECT DEFIB PAD ADLT CADENCE (PAD) ×1 IMPLANT
GUIDELINER 6F (CATHETERS) ×1 IMPLANT
HOVERMATT SINGLE USE (MISCELLANEOUS) ×1 IMPLANT
KIT ENCORE 26 ADVANTAGE (KITS) ×1 IMPLANT
KIT HEART LEFT (KITS) ×2 IMPLANT
LUBRICANT VIPERSLIDE CORONARY (MISCELLANEOUS) ×2 IMPLANT
PACK CARDIAC CATHETERIZATION (CUSTOM PROCEDURE TRAY) ×2 IMPLANT
SHEATH PINNACLE 6F 10CM (SHEATH) ×2 IMPLANT
SHEATH PROBE COVER 6X72 (BAG) ×1 IMPLANT
SLEEVE REPOSITIONING LENGTH 30 (MISCELLANEOUS) ×1 IMPLANT
STENT RESOLUTE ONYX 2.75X34 (Permanent Stent) ×1 IMPLANT
STENT RESOLUTE ONYX 3.5X22 (Permanent Stent) ×1 IMPLANT
TRANSDUCER W/STOPCOCK (MISCELLANEOUS) ×2 IMPLANT
TUBING CIL FLEX 10 FLL-RA (TUBING) ×2 IMPLANT
WIRE ASAHI PROWATER 300CM (WIRE) ×1 IMPLANT
WIRE EMERALD 3MM-J .035X150CM (WIRE) ×1 IMPLANT
WIRE HI TORQ VERSACORE-J 145CM (WIRE) ×1 IMPLANT
WIRE MINAMO 190 (WIRE) ×1 IMPLANT
WIRE VIPERWIRE COR FLEX .012 (WIRE) ×1 IMPLANT

## 2018-05-22 NOTE — Progress Notes (Addendum)
Patient c/o pressure in the middle of her chest that is a 3/10. Said her teeth were hurting. EKG performed. EKG stated sinus rhythm with PACs - nonspecific ST and T wave abnormalities. Patient BP 172/71. Nitroglycerin drip titrated up to 7.59ml/hr. Patient stated, after EKG, that pressure was easing off and is now a 1/10. Will continue to monitor. Lajoyce Corners, RN

## 2018-05-22 NOTE — Care Management Note (Addendum)
Case Management Note  Patient Details  Name: Joyce [name] MRN: [id number] Date of Birth: 21-Aug-1940  Subjective/Objective:  Pt presented as a transfer from [facility] for Nstemi. PTA from home. Plan to return home once stable on Brilinta. 30 day free card provided and placed in white patient belongings bag. Pt is aware of co pay.               Action/Plan: No home needs identified at this time.   Expected Discharge Date:  05/21/18               Expected Discharge Plan:  Home/Self Care  In-House Referral:  NA  Discharge planning Services  CM Consult  Post Acute Care Choice:  NA Choice offered to:  NA  DME Arranged:  N/A DME Agency:  NA  HH Arranged:  NA HH Agency:  NA  Status of Service:  Completed, signed off  If discussed at [place] of Stay Meetings, dates discussed:    Additional Comments: [phone] 05-23-18 Maral Lampe, RN,BSN [phone] Patient will now be on Plavix. Will not need Brilinta. CM will continue to monitor for additional needs.  Bethena Roys, RN 05/22/2018, 3:48 PM

## 2018-05-22 NOTE — Progress Notes (Signed)
Progress Note  Patient Name: Joyce [name] Date of Encounter: 05/22/2018  Primary Cardiologist:   Rozann Lesches, MD   Subjective   No chest pain.  Breathing OK.    Inpatient Medications    Scheduled Meds: . amLODipine  10 mg Oral Daily  . arformoterol  15 mcg Nebulization BID  . aspirin  81 mg Oral Daily  . atorvastatin  40 mg Oral q1800  . [START ON 05/23/2018] clopidogrel  75 mg Oral Q breakfast  . ferrous sulfate  325 mg Oral Daily  . fluticasone  2 spray Each Nare Daily  . heparin  5,000 Units Subcutaneous Q8H  . hydrALAZINE  100 mg Oral Q8H  . insulin aspart  0-20 Units Subcutaneous TID WC  . insulin glargine  80 Units Subcutaneous QHS  . primidone  50 mg Oral Daily  . sodium chloride flush  3 mL Intravenous Q12H  . sodium chloride flush  3 mL Intravenous Q12H  . umeclidinium bromide  1 puff Inhalation Daily  . vitamin C  500 mg Oral Daily   Continuous Infusions: . sodium chloride Stopped (05/20/18 1824)  . sodium chloride    . sodium chloride    . sodium chloride 1 mL/kg/hr (05/22/18 1224)  . nitroGLYCERIN 60 mcg/min (05/22/18 1354)   PRN Meds: sodium chloride, sodium chloride, acetaminophen, albuterol, ALPRAZolam, hydrALAZINE, labetalol, nitroGLYCERIN, ondansetron (ZOFRAN) IV, polyethylene glycol, sodium chloride flush, sodium chloride flush, traMADol   Vital Signs    Vitals:   05/22/18 1255 05/22/18 1310 05/22/18 1325 05/22/18 1425  BP: (!) 147/60 138/63 (!) 150/62 (!) 154/67  Pulse: 76 74 76 77  Resp: 15 13 15 13   Temp:      TempSrc:      SpO2: 98% 99% 97% 96%  Weight:      Height:        Intake/Output Summary (Last 24 hours) at 05/22/2018 1507 Last data filed at 05/22/2018 4315 Gross per 24 hour  Intake 658.2 ml  Output 1600 ml  Net -941.8 ml   Filed Weights   05/19/18 0338 05/20/18 0446 05/21/18 2207  Weight: 92 kg 91.5 kg 92.2 kg    Telemetry    NSR - Personally Reviewed  ECG    NA - Personally Reviewed  Physical Exam    GEN: No  acute distress.   Neck: No  JVD Cardiac: RRR, no murmurs, rubs, or gallops.  Respiratory: Clear   to auscultation bilaterally. GI: Soft, nontender, non-distended, normal bowel sounds  MS:  No edema; No deformity.  Arterial sheath in place. Neuro:   Nonfocal  Psych: Oriented and appropriate   Labs    Chemistry Recent Labs  Lab 05/20/18 0417 05/21/18 0538 05/22/18 0357  NA 139 139 139  K 4.7 4.2 4.2  CL 111 111 112*  CO2 22 22 21*  GLUCOSE 161* 104* 82  BUN 42* 34* 33*  CREATININE 1.90* 1.72* 1.76*  CALCIUM 8.7* 8.7* 8.9  GFRNONAA 25* 29* 28*  GFRAA 29* 33* 32*  ANIONGAP 6 6 6      Hematology Recent Labs  Lab 05/20/18 0417 05/21/18 0538 05/22/18 0339  WBC 2.9* 3.6* 4.4  RBC 2.95* 2.89* 2.91*  HGB 8.3* 8.1* 8.0*  HCT 26.4* 26.2* 25.9*  MCV 89.5 90.7 89.0  MCH 28.1 28.0 27.5  MCHC 31.4 30.9 30.9  RDW 12.8 12.9 12.8  PLT 101* 109* 111*    Cardiac Enzymes Recent Labs  Lab 05/17/18 1925 05/18/18 0104 05/18/18 0542  TROPONINI 0.31* 0.31*  0.20*   No results for input(s): TROPIPOC in the last 168 hours.   BNPNo results for input(s): BNP, PROBNP in the last 168 hours.   DDimer No results for input(s): DDIMER in the last 168 hours.   Radiology    No results found.  Cardiac Studies   Cath   Prox LAD to Mid LAD lesion is 100% stenosed.  Origin lesion is 100% stenosed.  Origin to Prox Graft lesion is 100% stenosed.  Origin to Prox Graft lesion is 100% stenosed.  Ramus lesion is 95% stenosed.  Prox Cx to Mid Cx lesion is 90% stenosed.  Prox LAD lesion is 80% stenosed.  Prox RCA lesion is 90% stenosed.  Mid RCA lesion is 99% stenosed.  Dist RCA lesion is 60% stenosed.   Diagnostic Diagram       Post-Intervention Diagram        Patient Profile     77 y.o. female with a history of multivessel CAD s/p CABG x4 in 2015, hypertension, hyperlipidemia, type 2 diabetes mellitus, and CKD stage III who was transferred from [facility] to [facility] for NSTEMI.  Status post cath with results as above.   Assessment & Plan    NSTEMI:   Cath with results as above.   Stented today.  Home in one to two days.  Need to follow creat post procedure.   ANEMIA:    Hgb was 8.0 and stable.  Will follow as an outpatient.  I will send iron studies with AM labs  CKD III:    Creat is stable.  Repeat BMET in the AM.   HTN:   Resume ACE inhibitor before discharge.   DYSLIPIDEMIA:   Increased to high dose statin.     DM:     She reports that the last A1C was 5.6.   No change in therapy.     For questions or updates, please contact [place] Please consult [email or web address] for contact info under Cardiology/STEMI.   Signed, Minus Breeding, MD  05/22/2018, 3:07 PM

## 2018-05-22 NOTE — CV Procedure (Signed)
   Staged orbital atherectomy and stent of the proximal mid and distal RCA via femoral approach using ultrasound guidance.  Prophylactic insertion of pacemaker via right femoral.  Successful proximal 22 x 3.5 Onyx postdilated to 3.75 with TIMI III flow after low and high speed orbital atherectomy.  Stenting of the proximal vessel followed stenting of the mid and distal vessel.  Successful mid and distal RCA stent using a 34 x 2.75 Onyx postdilated to 3.0.  Delivery of the distal stent required Guide catheter extender -Guide-liner.  Left femoral approach.  Hemostasis will be achieved by manual compression.  No immediate complications.  Coexisting clinical problems include significant back, hip, and generalized pain syndrome requiring significant amounts of opiates and benzodiazepine.

## 2018-05-22 NOTE — Progress Notes (Signed)
Site area: left groin ( venous sheath)  Site Prior to Removal:  Level 0  Pressure Applied For 10 MINUTES    Minutes Beginning at 1630  Manual:   Yes.    Patient Status During Pull:  WNL  Post Pull Groin Site:  Level 0  Post Pull Instructions Given:  Yes.    Post Pull Pulses Present:  Yes.    Dressing Applied:  Yes.    Comments:  Tolerated procedure well

## 2018-05-22 NOTE — Progress Notes (Signed)
Hypoglycemic Event  CBG: 63  Treatment: 46ml D50 IV (patient NPO)   Symptoms: none  Follow-up CBG: Time: 0655 CBG Result:92  Possible Reasons for Event: Patient given Lantus 80 units per order at bedtime  Comments/MD notified:will notify MD about reducing Lantus to 40 units HS (what patient takes at home)  Will continue to monitor.  Lajoyce Corners

## 2018-05-22 NOTE — Interval H&P Note (Signed)
Cath Lab Visit (complete for each Cath Lab visit)  Clinical Evaluation Leading to the Procedure:   ACS: Yes.    Non-ACS:    Anginal Classification: CCS IV  Anti-ischemic medical therapy: Maximal Therapy (2 or more classes of medications)  Non-Invasive Test Results: No non-invasive testing performed  Prior CABG: Previous CABG      History and Physical Interval Note:  05/22/2018 8:17 AM  Joyce [name]  has presented today for surgery, with the diagnosis of cad  The various methods of treatment have been discussed with the patient and family. After consideration of risks, benefits and other options for treatment, the patient has consented to  Procedure(s): CORONARY ATHERECTOMY (N/A) as a surgical intervention .  The patient's history has been reviewed, patient examined, no change in status, stable for surgery.  I have reviewed the patient's chart and labs.  Questions were answered to the patient's satisfaction.    The patient and family (daughter) were appraised of her elevated risk for procedural complications because of risk factors that include severe anemia, potential need for transfusion, CKD stage III/IV which increases the risk of acute kidney injury, and severe calcified CAD which will require atherectomy prior to stent implantation.  Downstream risk could include stent thrombosis.  She would be a candidate for shorten dual antiplatelet therapy given anemia and high bleeding risk.   Joyce [name] III

## 2018-05-22 NOTE — Progress Notes (Signed)
Site area: left groin ( arterial sheath)  Site Prior to Removal:  Level 0  Pressure Applied For 20 MINUTES    Minutes Beginning at 1635  Manual:   Yes.    Patient Status During Pull:  WNL  Post Pull Groin Site:  Level 0  Post Pull Instructions Given:  Yes.    Post Pull Pulses Present:  Yes.    Dressing Applied:  Yes.    Comments:  Tolerated procedure well

## 2018-05-23 ENCOUNTER — Inpatient Hospital Stay (HOSPITAL_COMMUNITY): Payer: Medicare Other

## 2018-05-23 DIAGNOSIS — D62 Acute posthemorrhagic anemia: Secondary | ICD-10-CM | POA: Diagnosis not present

## 2018-05-23 DIAGNOSIS — I10 Essential (primary) hypertension: Secondary | ICD-10-CM

## 2018-05-23 DIAGNOSIS — T148XXA Other injury of unspecified body region, initial encounter: Secondary | ICD-10-CM

## 2018-05-23 DIAGNOSIS — J449 Chronic obstructive pulmonary disease, unspecified: Secondary | ICD-10-CM

## 2018-05-23 DIAGNOSIS — I724 Aneurysm of artery of lower extremity: Secondary | ICD-10-CM | POA: Diagnosis present

## 2018-05-23 DIAGNOSIS — E669 Obesity, unspecified: Secondary | ICD-10-CM

## 2018-05-23 DIAGNOSIS — I25110 Atherosclerotic heart disease of native coronary artery with unstable angina pectoris: Secondary | ICD-10-CM

## 2018-05-23 DIAGNOSIS — Z955 Presence of coronary angioplasty implant and graft: Secondary | ICD-10-CM

## 2018-05-23 DIAGNOSIS — E1169 Type 2 diabetes mellitus with other specified complication: Secondary | ICD-10-CM

## 2018-05-23 LAB — CBC WITH DIFFERENTIAL/PLATELET
Abs Immature Granulocytes: 0.01 10*3/uL (ref 0.00–0.07)
Basophils Absolute: 0 10*3/uL (ref 0.0–0.1)
Basophils Relative: 0 %
Eosinophils Absolute: 0 10*3/uL (ref 0.0–0.5)
Eosinophils Relative: 1 %
HCT: 24.8 % — ABNORMAL LOW (ref 36.0–46.0)
Hemoglobin: 8 g/dL — ABNORMAL LOW (ref 12.0–15.0)
Immature Granulocytes: 0 %
Lymphocytes Relative: 24 %
Lymphs Abs: 0.9 10*3/uL (ref 0.7–4.0)
MCH: 28 pg (ref 26.0–34.0)
MCHC: 32.3 g/dL (ref 30.0–36.0)
MCV: 86.7 fL (ref 80.0–100.0)
Monocytes Absolute: 0.3 10*3/uL (ref 0.1–1.0)
Monocytes Relative: 9 %
NEUTROS ABS: 2.5 10*3/uL (ref 1.7–7.7)
NEUTROS PCT: 66 %
Platelets: 97 10*3/uL — ABNORMAL LOW (ref 150–400)
RBC: 2.86 MIL/uL — AB (ref 3.87–5.11)
RDW: 13.2 % (ref 11.5–15.5)
WBC: 3.8 10*3/uL — ABNORMAL LOW (ref 4.0–10.5)
nRBC: 0 % (ref 0.0–0.2)

## 2018-05-23 LAB — BASIC METABOLIC PANEL
Anion gap: 6 (ref 5–15)
BUN: 25 mg/dL — ABNORMAL HIGH (ref 8–23)
CHLORIDE: 111 mmol/L (ref 98–111)
CO2: 19 mmol/L — ABNORMAL LOW (ref 22–32)
Calcium: 8.3 mg/dL — ABNORMAL LOW (ref 8.9–10.3)
Creatinine, Ser: 1.64 mg/dL — ABNORMAL HIGH (ref 0.44–1.00)
GFR calc Af Amer: 35 mL/min — ABNORMAL LOW (ref >60)
GFR calc non Af Amer: 30 mL/min — ABNORMAL LOW (ref >60)
GLUCOSE: 115 mg/dL — AB (ref 70–99)
POTASSIUM: 4.1 mmol/L (ref 3.5–5.1)
SODIUM: 136 mmol/L (ref 135–145)

## 2018-05-23 LAB — GLUCOSE, CAPILLARY
Glucose-Capillary: 106 mg/dL — ABNORMAL HIGH (ref 70–99)
Glucose-Capillary: 141 mg/dL — ABNORMAL HIGH (ref 70–99)
Glucose-Capillary: 87 mg/dL (ref 70–99)
Glucose-Capillary: 114 mg/dL — ABNORMAL HIGH (ref 70–99)

## 2018-05-23 LAB — IRON AND TIBC
IRON: 27 ug/dL — AB (ref 28–170)
Saturation Ratios: 13 % (ref 10.4–31.8)
TIBC: 213 ug/dL — ABNORMAL LOW (ref 250–450)
UIBC: 186 ug/dL

## 2018-05-23 LAB — CBC
HEMATOCRIT: 21.5 % — AB (ref 36.0–46.0)
Hemoglobin: 7 g/dL — ABNORMAL LOW (ref 12.0–15.0)
MCH: 29 pg (ref 26.0–34.0)
MCHC: 32.6 g/dL (ref 30.0–36.0)
MCV: 89.2 fL (ref 80.0–100.0)
NRBC: 0 % (ref 0.0–0.2)
PLATELETS: 96 10*3/uL — AB (ref 150–400)
RBC: 2.41 MIL/uL — ABNORMAL LOW (ref 3.87–5.11)
RDW: 13 % (ref 11.5–15.5)
WBC: 3.4 10*3/uL — AB (ref 4.0–10.5)

## 2018-05-23 LAB — FOLATE: FOLATE: 9.7 ng/mL (ref >5.9)

## 2018-05-23 LAB — VITAMIN B12: Vitamin B-12: 175 pg/mL — ABNORMAL LOW (ref 180–914)

## 2018-05-23 LAB — FERRITIN: FERRITIN: 58 ng/mL (ref 11–307)

## 2018-05-23 LAB — PREPARE RBC (CROSSMATCH)

## 2018-05-23 MED ORDER — LISINOPRIL 5 MG PO TABS
5.0000 mg | ORAL_TABLET | Freq: Every day | ORAL | Status: DC
  Administered 2018-05-23 – 2018-05-26 (×4): 5 mg via ORAL
  Filled 2018-05-23 (×4): qty 1

## 2018-05-23 MED ORDER — SODIUM CHLORIDE 0.9% IV SOLUTION: Freq: Once | INTRAVENOUS | Status: DC

## 2018-05-23 NOTE — Progress Notes (Signed)
Called in the room by the pt which c/o feeling wet after voiding. Pt is on purewick. Bedrest was up at 21:00. Assessed pt's Left femoral site & noted bleeding with small hematoma. Manual pressure applied for 15 mins. Hematoma & bleeding controlled. Pressure dressing applied. V/S stable. Left femoral remains level "1" bruise & soft. Will continue to monitor pt.

## 2018-05-23 NOTE — Progress Notes (Signed)
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 The pseudo neck is long measuring 0.4 cm in width and 1.84 cm in length.  Landry Mellow, RDMS, RVT

## 2018-05-23 NOTE — Progress Notes (Signed)
Progress Note  Patient Name: Joyce [name] Date of Encounter: 05/23/2018  Primary Cardiologist: Rozann Lesches, MD   Subjective   Resting comfortably in bed, just wants to be all set up.  No chest pain or dyspnea.  Only notes discomfort in her left groin with manual pressure on the access site, but not having pain at rest.   Inpatient Medications    Scheduled Meds: . sodium chloride   Intravenous Once  . amLODipine  10 mg Oral Daily  . angioplasty book   Does not apply Once  . arformoterol  15 mcg Nebulization BID  . aspirin  81 mg Oral Daily  . atorvastatin  40 mg Oral q1800  . clopidogrel  75 mg Oral Q breakfast  . ferrous sulfate  325 mg Oral Daily  . fluticasone  2 spray Each Nare Daily  . heparin  5,000 Units Subcutaneous Q8H  . hydrALAZINE  100 mg Oral Q8H  . insulin aspart  0-20 Units Subcutaneous TID WC  . insulin glargine  40 Units Subcutaneous QHS  . lisinopril  5 mg Oral Daily  . primidone  50 mg Oral Daily  . sodium chloride flush  3 mL Intravenous Q12H  . umeclidinium bromide  1 puff Inhalation Daily  . vitamin C  500 mg Oral Daily   Continuous Infusions: . sodium chloride    . sodium chloride 1 mL/kg/hr (05/23/18 0200)  . nitroGLYCERIN Stopped (05/22/18 2030)   PRN Meds: sodium chloride, acetaminophen, albuterol, ALPRAZolam, nitroGLYCERIN, ondansetron (ZOFRAN) IV, polyethylene glycol, sodium chloride flush, traMADol   Vital Signs    Vitals:   05/23/18 0945 05/23/18 0954 05/23/18 1000 05/23/18 1038  BP:  (!) 152/54  (!) 159/61  Pulse:  81    Resp: 19 (!) 22 (!) 21 17  Temp:  99.1 F (37.3 C)  98.9 F (37.2 C)  TempSrc:  Oral  Oral  SpO2:    98%  Weight:      Height:        Intake/Output Summary (Last 24 hours) at 05/23/2018 1129 Last data filed at 05/23/2018 0954 Gross per 24 hour  Intake 3236.38 ml  Output 1400 ml  Net 1836.38 ml   Filed Weights   05/20/18 0446 05/21/18 2207 05/23/18 0630  Weight: 91.5 kg 92.2 kg 92 kg     Telemetry    Normal sinus rhythm-70s.  Personally Reviewed  ECG    Normal sinus rhythm, rate 77 bpm.  Borderline QRS abnormality, but otherwise normal.- Personally Reviewed  Physical Exam   GEN: No acute distress.   Neck: No JVD or bruit Cardiac: RRR, normal S1-S2.  2/6 SEM at RUSB.  No other M/R/G. Respiratory: Clear to auscultation bilaterally.  Nonlabored GI: Soft, nontender, non-distended; NABS MS: No edema; No deformity.  Left groin site painful to touch with mild bruising.  Small pseudoaneurysm palpated. Neuro:  Nonfocal  Psych: Normal affect   Labs    Chemistry Recent Labs  Lab 05/21/18 0538 05/22/18 0357 05/23/18 0225  NA 139 139 136  K 4.2 4.2 4.1  CL 111 112* 111  CO2 22 21* 19*  GLUCOSE 104* 82 115*  BUN 34* 33* 25*  CREATININE 1.72* 1.76* 1.64*  CALCIUM 8.7* 8.9 8.3*  GFRNONAA 29* 28* 30*  GFRAA 33* 32* 35*  ANIONGAP 6 6 6      Hematology Recent Labs  Lab 05/22/18 0339 05/22/18 1537 05/22/18 1849 05/23/18 0225  WBC 4.4 3.9*  --  3.4*  RBC 2.91* 2.96* 2.63*  2.41*  HGB 8.0* 8.5*  --  7.0*  HCT 25.9* 26.6*  --  21.5*  MCV 89.0 89.9  --  89.2  MCH 27.5 28.7  --  29.0  MCHC 30.9 32.0  --  32.6  RDW 12.8 12.9  --  13.0  PLT 111* 104*  --  96*    Cardiac Enzymes Recent Labs  Lab 05/17/18 1925 05/18/18 0104 05/18/18 0542  TROPONINI 0.31* 0.31* 0.20*   No results for input(s): TROPIPOC in the last 168 hours.   BNPNo results for input(s): BNP, PROBNP in the last 168 hours.   DDimer No results for input(s): DDIMER in the last 168 hours.   Radiology    Vas [place] Groin Pseudoaneurysm  Result Date: 05/23/2018  ARTERIAL PSEUDOANEURYSM  Exam: Left groin History: S/p catheterization. Performing Technologist: Landry Mellow RDMS, RVT  Examination Guidelines: A complete evaluation includes B-mode imaging, spectral Doppler, color Doppler, and power Doppler as needed of all accessible portions of each vessel. Bilateral testing is considered an  integral part of a complete examination. Limited examinations for reoccurring indications may be performed as noted. +-----------+----------+---------+------+----------+ Left DuplexPSV (cm/s)Waveform PlaqueComment(s) +-----------+----------+---------+------+----------+ CFA           255    triphasic                 +-----------+----------+---------+------+----------+ Left Vein comments: Patent femoral vein by Color Doppler  Summary: Left groin: Appears to be a small pseudoaneurysm measuring 1.89 cm off the left common femoral artery. The pseudo neck is long measuring 0.4 cm in width and 1.84 cm in length. Diagnosing physician: Servando Snare MD Electronically signed by Servando Snare MD on 05/23/2018 at 10:29:56 AM.   --------------------------------------------------------------------------------    Final     Cardiac Studies    2D Echo 05/19/2017: EF improved to 60-65% with moderate LVH.  Mild baseline inferior hypokinesis.  GR 1 DD.  Mild MR with mildly thickened leaflets.  Mild biatrial enlargement.  Aortic sclerosis without stenosis.  LEFT HEART CATH AND CORS/GRAFTS ANGIOGRAPHY (05/20/2018)  80% pLAD -> 100% CTO of p-m LAD, 95% RI, 90% p-m Cx. pRCA 90% (calcified), mRCA 99%, dRCA 60%.  Patent LIMA-mLAD, occluded SVG-D1, SVG-RI, SVG-RPDA   Staged CORONARY ATHERECTOMY WITH STENT INTERVENTION 05/22/2018  Complex with orbital atherectomy followed by stenting of the proximal mid and distal RCA.  MRCA99% & dRCA 70% -> 0%: 2.75 x 34 mm Onyx DES --> postdil - 3.0 mm; TIMI grade III flow.  85% pRCA --> 0%  3.19mm x 22 mm Onyx DES --> postdil 3.75 mm; TIMI grade III flow.  RECOMMENDATIONS: Aspirin and Plavix dual antiplatelet therapy for at least 30 days and preferably for 3 months at which point Plavix monotherapy can be used to complete a 90-month duration of therapy.      -->            L Femoral Artery Doppler (05/21/18): Left groin: Appears to be a small pseudoaneurysm measuring  1.89 cm off the left common femoral artery. The pseudo neck is long measuring 0.4 cm in width and 1.84 cm in length   Patient Profile     77 y.o. female with a history of multivessel CAD s/p CABG x4 in 2015, hypertension, hyperlipidemia, type 2 diabetes mellitus, and CKD stage III [facility].  Status post cath with results as above --> now status post staged atherectomy based to say DES PCI of native RCA reviewed above.  Assessment & Plan  Principal Problem:   NSTEMI (non-ST elevated myocardial infarction) ([place]) Active Problems:   Femoral artery pseudo-aneurysm, left (HCC)   Acute on chronic blood loss anemia   Coronary artery disease involving native coronary artery of native heart with unstable angina pectoris (HCC)   S/P right coronary artery (RCA) stent placement   Essential hypertension   Chronic kidney disease (CKD), stage III (moderate) (HCC)   Diabetes mellitus type 2 in obese (HCC)   S/P CABG x 4   COPD with asthma (HCC)  Principal Problem:   NSTEMI (non-ST elevated myocardial infarction) / S/P CABG x 4/ Coronary artery disease involving native coronary artery of native heart with unstable angina pectoris /   S/P right coronary artery (RCA) stent placement  Diagnostic cath revealed occluded vein grafts.  Now status post staged PCI of the RCA.  Now on high-dose statin along with aspirin plus Plavix. -->  Will attempt aspirin plus Plavix for 6 months.  With the extent of stent, recommend that we continue Plavix beyond that up to 12-24 months but stop aspirin at 6 months.  (Would be okay to hold Plavix for procedures after 6 months).  Not currently on beta-blocker -would consider starting low-dose beta-blocker tomorrow pending blood pressure  Currently on calcium channel blocker and restarting ACE inhibitor due to hypotension.    Femoral artery pseudo-aneurysm, left (HCC) -post-sheath removal complication with painful  left groin, pseudoaneurysm found on ultrasound.  Discussed with Dr. Donzetta Matters, he recommends manual pressure being held for attempted closure.  If that not successful, would ask vascular surgery to perform thrombin injection.  Continue bedrest for now.  Will likely need pain management during pressure holding.   Acute on chronic blood loss anemia -drop in hemoglobin to 7.0 today.  Is already received 1 unit PRBC transfusion.  Repeat CBC this afternoon.  Low threshold for 1 more unit to ensure hemoglobin greater than 8.    Essential hypertension -ACE inhibitor temporally help per peri-procedure.  Will be starting some today.    Will start beta-blocker this evening.    Chronic kidney disease (CKD), stage III (moderate) --stable creatinine post cath.  Continue to monitor.  Okay to restart ACE inhibitor.    Diabetes mellitus type 2 in obese --on Lantus plus sliding scale insulin.  Would transition back to home regimen prior to discharge.      COPD with asthma ([place]) -stable.  Nebulizer ordered.  Would use beta 1 selective beta-blocker.   Disposition: Unfortunately because of Thanksgiving, staff not available to perform a compression today of pseudoaneurysm.  Will attempt tomorrow.  Depending on how well that goes if there is closure of the pseudoaneurysm, could potentially discharge tomorrow if hemoglobin and creatinine stable.  But otherwise would anticipate discharge on Saturday.    For questions or updates, please contact [place] Please consult [email or web address] for contact info under        Signed, Glenetta Hew, MD  05/23/2018, 11:29 AM

## 2018-05-24 ENCOUNTER — Encounter (HOSPITAL_COMMUNITY): Payer: Self-pay | Admitting: Interventional Cardiology

## 2018-05-24 ENCOUNTER — Inpatient Hospital Stay (HOSPITAL_COMMUNITY): Payer: Medicare Other

## 2018-05-24 DIAGNOSIS — I724 Aneurysm of artery of lower extremity: Secondary | ICD-10-CM

## 2018-05-24 LAB — CBC
HCT: 25.7 % — ABNORMAL LOW (ref 36.0–46.0)
Hemoglobin: 8.1 g/dL — ABNORMAL LOW (ref 12.0–15.0)
MCH: 27.6 pg (ref 26.0–34.0)
MCHC: 31.5 g/dL (ref 30.0–36.0)
MCV: 87.7 fL (ref 80.0–100.0)
Platelets: 97 10*3/uL — ABNORMAL LOW (ref 150–400)
RBC: 2.93 MIL/uL — ABNORMAL LOW (ref 3.87–5.11)
RDW: 13.3 % (ref 11.5–15.5)
WBC: 3.9 10*3/uL — ABNORMAL LOW (ref 4.0–10.5)
nRBC: 0 % (ref 0.0–0.2)

## 2018-05-24 LAB — GLUCOSE, CAPILLARY
Glucose-Capillary: 185 mg/dL — ABNORMAL HIGH (ref 70–99)
Glucose-Capillary: 85 mg/dL (ref 70–99)
Glucose-Capillary: 134 mg/dL — ABNORMAL HIGH (ref 70–99)
Glucose-Capillary: 143 mg/dL — ABNORMAL HIGH (ref 70–99)

## 2018-05-24 LAB — BASIC METABOLIC PANEL
Anion gap: 5 (ref 5–15)
BUN: 25 mg/dL — ABNORMAL HIGH (ref 8–23)
CO2: 19 mmol/L — ABNORMAL LOW (ref 22–32)
CREATININE: 1.8 mg/dL — AB (ref 0.44–1.00)
Calcium: 8.5 mg/dL — ABNORMAL LOW (ref 8.9–10.3)
Chloride: 111 mmol/L (ref 98–111)
GFR calc Af Amer: 31 mL/min — ABNORMAL LOW (ref >60)
GFR, EST NON AFRICAN AMERICAN: 27 mL/min — AB (ref >60)
Glucose, Bld: 116 mg/dL — ABNORMAL HIGH (ref 70–99)
Potassium: 3.9 mmol/L (ref 3.5–5.1)
Sodium: 135 mmol/L (ref 135–145)

## 2018-05-24 MED ORDER — HYDRALAZINE HCL 20 MG/ML IJ SOLN
INTRAMUSCULAR | Status: AC
  Filled 2018-05-24: qty 1

## 2018-05-24 MED ORDER — MORPHINE SULFATE (PF) 2 MG/ML IV SOLN
2.0000 mg | INTRAVENOUS | Status: DC | PRN
  Administered 2018-05-24: 4 mg via INTRAVENOUS
  Filled 2018-05-24: qty 1
  Filled 2018-05-24: qty 2

## 2018-05-24 MED ORDER — NALOXONE HCL 0.4 MG/ML IJ SOLN
INTRAMUSCULAR | Status: AC
  Filled 2018-05-24: qty 1

## 2018-05-24 MED ORDER — HYDROCODONE-ACETAMINOPHEN 5-325 MG PO TABS
1.0000 | ORAL_TABLET | ORAL | Status: DC | PRN
  Administered 2018-05-24 – 2018-05-25 (×2): 2 via ORAL
  Filled 2018-05-24 (×2): qty 2

## 2018-05-24 MED ORDER — HYDRALAZINE HCL 20 MG/ML IJ SOLN
5.0000 mg | Freq: Once | INTRAMUSCULAR | Status: AC
  Administered 2018-05-24: 5 mg via INTRAVENOUS

## 2018-05-24 MED FILL — Lidocaine HCl Local Preservative Free (PF) Inj 1%: INTRAMUSCULAR | Qty: 30 | Status: AC

## 2018-05-24 MED FILL — Heparin Sod (Porcine)-NaCl IV Soln 1000 Unit/500ML-0.9%: INTRAVENOUS | Qty: 1000 | Status: AC

## 2018-05-24 MED FILL — Heparin Sod (Porcine)-NaCl IV Soln 1000 Unit/500ML-0.9%: INTRAVENOUS | Qty: 500 | Status: AC

## 2018-05-24 NOTE — Progress Notes (Signed)
Progress Note  Patient Name: Joyce [name] Date of Encounter: 05/24/2018  Primary Cardiologist:   Rozann Lesches, MD   Subjective   She feels fine today. She has had no chest pain and no SOB.   Inpatient Medications    Scheduled Meds: . sodium chloride   Intravenous Once  . amLODipine  10 mg Oral Daily  . angioplasty book   Does not apply Once  . arformoterol  15 mcg Nebulization BID  . aspirin  81 mg Oral Daily  . atorvastatin  40 mg Oral q1800  . clopidogrel  75 mg Oral Q breakfast  . ferrous sulfate  325 mg Oral Daily  . fluticasone  2 spray Each Nare Daily  . heparin  5,000 Units Subcutaneous Q8H  . hydrALAZINE  100 mg Oral Q8H  . insulin aspart  0-20 Units Subcutaneous TID WC  . insulin glargine  40 Units Subcutaneous QHS  . lisinopril  5 mg Oral Daily  . primidone  50 mg Oral Daily  . sodium chloride flush  3 mL Intravenous Q12H  . umeclidinium bromide  1 puff Inhalation Daily  . vitamin C  500 mg Oral Daily   Continuous Infusions: . sodium chloride    . nitroGLYCERIN Stopped (05/22/18 2030)   PRN Meds: sodium chloride, acetaminophen, albuterol, ALPRAZolam, nitroGLYCERIN, ondansetron (ZOFRAN) IV, polyethylene glycol, sodium chloride flush, traMADol   Vital Signs    Vitals:   05/23/18 1038 05/23/18 2057 05/23/18 2103 05/24/18 0558  BP: (!) 159/61 (!) 167/67 (!) 167/67 (!) 169/69  Pulse:  72 71   Resp: 17 16 14    Temp: 98.9 F (37.2 C) 99.1 F (37.3 C)    TempSrc: Oral Oral    SpO2: 98% 97% 98%   Weight:      Height:        Intake/Output Summary (Last 24 hours) at 05/24/2018 0803 Last data filed at 05/23/2018 2100 Gross per 24 hour  Intake 555 ml  Output 1100 ml  Net -545 ml   Filed Weights   05/20/18 0446 05/21/18 2207 05/23/18 0630  Weight: 91.5 kg 92.2 kg 92 kg    Telemetry    NSR - Personally Reviewed  ECG    NA - Personally Reviewed  Physical Exam   GEN: No  acute distress.   Neck: No  JVD Cardiac: RRR, soft apical  systolic murmur, no diastolic murmurs, rubs, or gallops.  Respiratory: Clear  to auscultation bilaterally. GI: Soft, nontender, non-distended, normal bowel sounds  MS:  No  edema; No deformity.  Left femoral with soft bruit and tenderness to palpation.  Neuro:   Nonfocal  Psych: Oriented and appropriate    Labs    Chemistry Recent Labs  Lab 05/22/18 0357 05/23/18 0225 05/24/18 0415  NA 139 136 135  K 4.2 4.1 3.9  CL 112* 111 111  CO2 21* 19* 19*  GLUCOSE 82 115* 116*  BUN 33* 25* 25*  CREATININE 1.76* 1.64* 1.80*  CALCIUM 8.9 8.3* 8.5*  GFRNONAA 28* 30* 27*  GFRAA 32* 35* 31*  ANIONGAP 6 6 5      Hematology Recent Labs  Lab 05/23/18 0225 05/23/18 1507 05/24/18 0415  WBC 3.4* 3.8* 3.9*  RBC 2.41* 2.86* 2.93*  HGB 7.0* 8.0* 8.1*  HCT 21.5* 24.8* 25.7*  MCV 89.2 86.7 87.7  MCH 29.0 28.0 27.6  MCHC 32.6 32.3 31.5  RDW 13.0 13.2 13.3  PLT 96* 97* 97*    Cardiac Enzymes Recent Labs  Lab 05/17/18  1925 05/18/18 0104 05/18/18 0542  TROPONINI 0.31* 0.31* 0.20*   No results for input(s): TROPIPOC in the last 168 hours.   BNPNo results for input(s): BNP, PROBNP in the last 168 hours.   DDimer No results for input(s): DDIMER in the last 168 hours.   Radiology    Vas [place] Groin Pseudoaneurysm  Result Date: 05/23/2018  ARTERIAL PSEUDOANEURYSM  Exam: Left groin History: S/p catheterization. Performing Technologist: Landry Mellow RDMS, RVT  Examination Guidelines: A complete evaluation includes B-mode imaging, spectral Doppler, color Doppler, and power Doppler as needed of all accessible portions of each vessel. Bilateral testing is considered an integral part of a complete examination. Limited examinations for reoccurring indications may be performed as noted. +-----------+----------+---------+------+----------+ Left DuplexPSV (cm/s)Waveform PlaqueComment(s) +-----------+----------+---------+------+----------+ CFA           255    triphasic                  +-----------+----------+---------+------+----------+ Left Vein comments: Patent femoral vein by Color Doppler  Summary: Left groin: Appears to be a small pseudoaneurysm measuring 1.89 cm off the left common femoral artery. The pseudo neck is long measuring 0.4 cm in width and 1.84 cm in length. Diagnosing physician: Servando Snare MD Electronically signed by Servando Snare MD on 05/23/2018 at 10:29:56 AM.   --------------------------------------------------------------------------------    Final     Cardiac Studies   Cath   Prox LAD to Mid LAD lesion is 100% stenosed.  Origin lesion is 100% stenosed.  Origin to Prox Graft lesion is 100% stenosed.  Origin to Prox Graft lesion is 100% stenosed.  Ramus lesion is 95% stenosed.  Prox Cx to Mid Cx lesion is 90% stenosed.  Prox LAD lesion is 80% stenosed.  Prox RCA lesion is 90% stenosed.  Mid RCA lesion is 99% stenosed.  Dist RCA lesion is 60% stenosed.   Diagnostic Diagram       Post-Intervention Diagram        Patient Profile     77 y.o. female with a history of multivessel CAD s/p CABG x4 in 2015, hypertension, hyperlipidemia, type 2 diabetes mellitus, and CKD stage III who was transferred from [facility] to [facility] for NSTEMI.  Status post cath with results as above.   Now with anemia requiring transfusion an pseudoaneurysm requiring compression.   Assessment & Plan    NSTEMI:   Cath with results as above.  Continue medical management.  She has a history of not being able to tolerate beta blockers  PSEUDOANEURYSM:  She will have an attempt at compressing this today.   ANEMIA:   Hgb drifted down to 7 and she now has had a transfusion.  She has mildly low iron but has not had active GI bleeding and was guaiac negative prior to PCI. Continue to follow.  She will need out patient follow.    CKD III:    Creat is elevated but stable.   HTN:   Continuing to hold ACE inhibitor.    DYSLIPIDEMIA:   Increased to high  dose statin this admission. .     DM:     She reports that the last A1C was 5.6.  No change in therapy.       For questions or updates, please contact [place] Please consult [email or web address] for contact info under Cardiology/STEMI.   Signed, Minus Breeding, MD  05/24/2018, 8:03 AM

## 2018-05-24 NOTE — Consult Note (Addendum)
Hospital Consult    Reason for Consult:  Pseudoaneurysm left groin Requesting Physician:  Ellyn Hack MRN #:  [id number]  History of Present Illness: This is a 77 y.o. female who had a hx of CABG x 4 in 2015.  She recently had chest pain with NSTEMI and underwent diagnostic angiography on 05/20/18.  She underwent coronary atherectomy on 05/22/18 via left femoral approach.  Yesterday, she was noted to have some bleeding and small hematoma in the left groin.  A left groin limited arterial duplex reveals pseudoaneurysm measuring 1.89 of the left common femoral artery with the neck measuring 0.4cm in width and 1.84cm in length. She underwent compression of the PSA and was partially reduced in size but the pseudo neck remains in size from prior to compression.  Vascular surgery is consulted for management.   Past Medical History:  Diagnosis Date  . Anemia   . Arthritis   . Bilateral pleural effusion    Postoperative, status post Pleurx catheter and talc treatment - Dr. Prescott Gum  . 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 Chronic kidney disease (CKD), stage III (moderate) (HCC)   . Chronic lower back pain   . Essential hypertension   . Fibromyalgia   . History of pneumonia   . Hyperlipidemia   . Type II diabetes mellitus ([place])     Past Surgical History:  Procedure Laterality Date  . ABDOMINAL HYSTERECTOMY  1980's  . ANTERIOR CERVICAL DECOMP/DISCECTOMY FUSION  2000's  . APPENDECTOMY  1970's  . BACK SURGERY    . CHEST TUBE INSERTION Left 08/07/2014   Procedure: INSERTION PLEURAL DRAINAGE CATHETER;  Surgeon: Ivin Poot, MD;  Location: [place];  Service: Thoracic;  Laterality: Left;  . CHEST TUBE INSERTION Right 08/12/2014   Procedure: INSERTION PLEURAL DRAINAGE CATHETER;  Surgeon: Ivin Poot, MD;  Location: [place];  Service: Thoracic;  Laterality: Right;  . CHOLECYSTECTOMY  ~ 2005  . CORONARY ARTERY  BYPASS GRAFT N/A 06/15/2014   Procedure: CORONARY ARTERY BYPASS GRAFTING (CABG);  Surgeon: Ivin Poot, MD;  Location: [place];  Service: Open Heart Surgery;  Laterality: N/A;  . CORONARY ATHERECTOMY N/A 05/22/2018   Procedure: CORONARY ATHERECTOMY;  Surgeon: Belva Crome, MD;  Location: [place] [facility];  Service: Cardiovascular;  Laterality: N/A;  . CORONARY STENT INTERVENTION N/A 05/22/2018   Procedure: CORONARY STENT INTERVENTION;  Surgeon: Belva Crome, MD;  Location: [place] [facility];  Service: Cardiovascular;  Laterality: N/A;  rca   . INTRAOPERATIVE TRANSESOPHAGEAL ECHOCARDIOGRAM N/A 06/15/2014   Procedure: INTRAOPERATIVE TRANSESOPHAGEAL ECHOCARDIOGRAM;  Surgeon: Ivin Poot, MD;  Location: [place];  Service: Open Heart Surgery;  Laterality: N/A;  . LEFT HEART CATH AND CORS/GRAFTS ANGIOGRAPHY N/A 05/20/2018   Procedure: LEFT HEART CATH AND CORS/GRAFTS ANGIOGRAPHY;  Surgeon: Lorretta Harp, MD;  Location: [place] [facility];  Service: Cardiovascular;  Laterality: N/A;  . LEFT HEART CATHETERIZATION WITH CORONARY ANGIOGRAM N/A 06/11/2014   Procedure: LEFT HEART CATHETERIZATION WITH CORONARY ANGIOGRAM;  Surgeon: Blane Ohara, MD;  Location: [facility];  Service: Cardiovascular;  Laterality: N/A;  . LUMBAR DISC SURGERY  1980's X 1; 1990's X  2000's X 1  . REMOVAL OF PLEURAL DRAINAGE CATHETER Bilateral 02/11/2015   Procedure: REMOVAL OF PLEURAL DRAINAGE CATHETER;  Surgeon: Ivin Poot, MD;  Location: [place];  Service: Thoracic;  Laterality: Bilateral;  .  TALC PLEURODESIS Bilateral 02/11/2015   Procedure: Pietro Cassis;  Surgeon: Ivin Poot, MD;  Location: [place];  Service: Thoracic;  Laterality: Bilateral;  . TONSILLECTOMY  ~ 1950    Allergies  Allergen Reactions  . Carvedilol Other (See Comments)    confusion  . Codeine Other (See Comments)    confusion  . Metoprolol Palpitations    Prior to Admission medications   Medication Sig Start Date End Date  Taking? Authorizing Provider  albuterol (PROVENTIL HFA;VENTOLIN HFA) 108 (90 Base) MCG/ACT inhaler Inhale 2 puffs into the lungs every 4 (four) hours as needed for wheezing or shortness of breath. 07/17/17  Yes Byrum, Rose Fillers, MD  amLODipine (NORVASC) 10 MG tablet Take 10 mg by mouth daily.  05/28/14  Yes [provider]  aspirin EC 81 MG tablet Take 1 tablet (81 mg total) by mouth daily. 01/25/16  Yes Satira Sark, MD  atorvastatin (LIPITOR) 40 MG tablet Take 1 tablet (40 mg total) by mouth daily at 6 PM. 06/22/14  Yes Lars Pinks M, PA-C  B Complex-C-Folic Acid (DIALYVITE PO) Take 1 tablet by mouth daily.  09/22/14  Yes [provider]  busPIRone (BUSPAR) 5 MG tablet Take 5 mg by mouth 3 (three) times daily. 04/29/18  Yes [provider]  Cholecalciferol (VITAMIN D3) 2000 units TABS Take 2,000 Units by mouth daily.    Yes [provider]  Ferrous Sulfate (IRON) 28 MG TABS Take 28 mg by mouth daily.    Yes [provider]  furosemide (LASIX) 20 MG tablet Take 20-40 mg by mouth See admin instructions. Take 1 tablet (40mg ) on Sun Tues Thurs Sat, then on all other days Mon Wed Fri take 1/2 tablet (20mg )   Yes [provider]  GLUCERNA (GLUCERNA) LIQD Take 237 mLs by mouth daily.   Yes [provider]  hydrALAZINE (APRESOLINE) 100 MG tablet Take 100 mg by mouth 3 (three) times daily.   Yes [provider]  insulin glargine (LANTUS) 100 UNIT/ML injection Inject 40 Units into the skin at bedtime.    Yes [provider]  insulin lispro (HUMALOG) 100 UNIT/ML KiwkPen Inject 8 Units into the skin 3 (three) times daily with meals.    Yes [provider]  lisinopril (PRINIVIL,ZESTRIL) 5 MG tablet Take 5 mg by mouth daily.    Yes [provider]  polyethylene glycol (MIRALAX / GLYCOLAX) packet Take 17 g by mouth daily as needed for mild constipation. 08/13/14  Yes Moding, Langley Gauss, MD  primidone (MYSOLINE)  50 MG tablet Take 50 mg by mouth daily.  05/28/14  Yes [provider]  Tiotropium Bromide-Olodaterol (STIOLTO RESPIMAT) 2.5-2.5 MCG/ACT AERS Take 2 puffs by mouth daily. 12/20/17  Yes Collene Gobble, MD  traMADol (ULTRAM) 50 MG tablet Take 1 tablet (50 mg total) by mouth every 6 (six) hours as needed for moderate pain. Patient taking differently: Take 50 mg by mouth every 4 (four) hours as needed for moderate pain or severe pain.  06/22/14  Yes Lars Pinks M, PA-C  vitamin C (ASCORBIC ACID) 500 MG tablet Take 500 mg by mouth daily.   Yes [provider]  ACCU-CHEK AVIVA PLUS test strip Inject 1 strip as directed 4 (four) times daily. 11/16/14   [provider]  Insulin Syringe-Needle U-100 (INSULIN SYRINGE .5CC/30GX5/16") 30G X 5/16" 0.5 ML MISC Inject 0.5 mLs as directed daily. 01/18/15   [provider]  Insulin Syringe-Needle U-100 (INSULIN SYRINGE .5CC/30GX5/16") 30G X 5/16"  0.5 ML MISC Inject 0.5 mLs as directed 3 (three) times daily. 01/18/15   [provider]    Social History   Socioeconomic History  . Marital status: Widowed    Spouse name: Not on file  . Number of children: Not on file  . Years of education: Not on file  . Highest education level: Not on file  Occupational History  . Not on file  Social Needs  . Financial resource strain: Not on file  . Food insecurity:    Worry: Not on file    Inability: Not on file  . Transportation needs:    Medical: Not on file    Non-medical: Not on file  Tobacco Use  . Smoking status: Never Smoker  . Smokeless tobacco: Never Used  Substance and Sexual Activity  . Alcohol use: No    Alcohol/week: 0.0 standard drinks  . Drug use: No  . Sexual activity: Never  Lifestyle  . Physical activity:    Days per week: Not on file    Minutes per session: Not on file  . Stress: Not on file  Relationships  . Social connections:    Talks on phone: Not on file    Gets together: Not on file      Attends religious service: Not on file    Active member of club or organization: Not on file    Attends meetings of clubs or organizations: Not on file    Relationship status: Not on file  . Intimate partner violence:    Fear of current or ex partner: Not on file    Emotionally abused: Not on file    Physically abused: Not on file    Forced sexual activity: Not on file  Other Topics Concern  . Not on file  Social History Narrative  . Not on file     Family History  Problem Relation Age of Onset  . Stroke Father   . Diabetes Mother   . Hypertension Mother   . Alcoholism Brother   . Heart disease Sister   . Alcoholism Brother   . CAD Paternal Grandmother        heart attack  . CAD Paternal Uncle        heart attack    ROS: [x]  Positive   [ ]  Negative   [ ]  All sytems reviewed and are negative  Cardiac: [x]  chest pain/pressure  Vascular: [x]  pain in left groin   Pulmonary: []  productive cough []  asthma/wheezing []  home O2  Neurologic: []  weakness in []  arms []  legs []  numbness in []  arms []  legs []  hx of CVA []  mini stroke [] difficulty speaking or slurred speech []  temporary loss of vision in one eye []  dizziness  Hematologic: []  hx of cancer []  bleeding problems []  problems with blood clotting easily  Endocrine:   [x]  diabetes []  thyroid disease  GI []  vomiting blood []  blood in stool  GU: [x]  CKD/renal failure []  HD--[]  M/W/F or []  T/T/S []  burning with urination []  blood in urine  Psychiatric: []  anxiety []  depression  Musculoskeletal: [x]  arthritis []  joint pain  Integumentary: []  rashes []  ulcers  Constitutional: []  fever []  chills   Physical Examination  Vitals:   05/24/18 0558 05/24/18 0827  BP: (!) 169/69   Pulse:    Resp:  20  Temp:    SpO2:  98%   Body mass index is 33.75 kg/m.  General:  WDWN in NAD Gait: Not observed HENT: WNL, normocephalic  Pulmonary: normal non-labored breathing Cardiac:  regular Abdomen:  soft, NT/ND, no masses Skin: without rashes Vascular Exam/Pulses: Pulsatile mass left groin that is painful to palpation. Musculoskeletal: no muscle wasting or atrophy  Neurologic: A&O X 3;  No focal weakness or paresthesias are detected; speech is fluent/normal Psychiatric:  The pt has Normal affect.   CBC    Component Value Date/Time   WBC 3.9 (L) 05/24/2018 0415   RBC 2.93 (L) 05/24/2018 0415   HGB 8.1 (L) 05/24/2018 0415   HCT 25.7 (L) 05/24/2018 0415   PLT 97 (L) 05/24/2018 0415   MCV 87.7 05/24/2018 0415   MCH 27.6 05/24/2018 0415   MCHC 31.5 05/24/2018 0415   RDW 13.3 05/24/2018 0415   LYMPHSABS 0.9 05/23/2018 1507   MONOABS 0.3 05/23/2018 1507   EOSABS 0.0 05/23/2018 1507   BASOSABS 0.0 05/23/2018 1507    BMET    Component Value Date/Time   NA 135 05/24/2018 0415   K 3.9 05/24/2018 0415   CL 111 05/24/2018 0415   CO2 19 (L) 05/24/2018 0415   GLUCOSE 116 (H) 05/24/2018 0415   BUN 25 (H) 05/24/2018 0415   CREATININE 1.80 (H) 05/24/2018 0415   CALCIUM 8.5 (L) 05/24/2018 0415   GFRNONAA 27 (L) 05/24/2018 0415   GFRAA 31 (L) 05/24/2018 0415    COAGS: Lab Results  Component Value Date   INR 1.13 05/17/2018   INR 1.05 08/04/2014   INR 1.10 07/27/2014     Non-Invasive Vascular Imaging:   See HPI  Statin:  Yes.   Beta Blocker:  No. Aspirin:  Yes.   ACEI:  Yes.   ARB:  No. CCB use:  Yes Other antiplatelets/anticoagulants:  Yes.   Plavix   ASSESSMENT/PLAN: This is a 77 y.o. female s/p cardiac catheterization now with left femoral psa.     -will plan for thrombin injection at bedside tomorrow morning. Pt may eat today and npo after MN.  Pt and family member in agreement.  Dr. Donzetta Matters has spoken to the vascular lab to coordinate.    Leontine Locket, PA-C [facility] [phone]   I have interviewed and patient with PA and agree with assessment and plan above. Ok to eat today and will plan thrombin injection  tomorrow.  I have correlated with PV lab for approximate 10 AM.  Should be okay for discharge as long as she tolerates well tomorrow.  Brandon C. Donzetta Matters, MD [facility] [place] Office: [phone] Pager: [phone]

## 2018-05-24 NOTE — Care Management Important Message (Signed)
Important Message  Patient Details  Name: Joyce [name] MRN: [id number] Date of Birth: 1940/10/04   Medicare Important Message Given:  Yes. CMA tablet currently not working, therefore patient is unable to sign IM. CMA informed patient of Medicare rights and answered a few questions. CMA left IM with patient.     Denis Carreon 05/24/2018, 12:03 PM

## 2018-05-24 NOTE — Progress Notes (Signed)
   Patient underwent attempted compression closure of a left femoral pseudoaneurysm with the vascular ultrasound tech at ~11am today. She was premedicated with vicodin 5mg -325mg  2 tabs and required an additional 6mg  IV morphine throughout the procedure with adequate pain control. She was also given IV hydralazine 5mg  for BP control. Unfortunately complete resolution of the pseudoaneurysm was not achieved after 30 minute attempt - at the end of the study it was noted to be half the size. Dr. Donzetta Matters contacted to review images to determine if next steps will be thrombin injection vs conservative monitoring with follow-up ultrasound. He will see this afternoon.   Will keep NPO for now. Continue to monitor vitals closely given narcotic burden in the past hour.   Abigail Butts, PA-C 05/24/18

## 2018-05-24 NOTE — Progress Notes (Signed)
Left groin pseudoaneurysm compression.   After 30 minutes total compression, the pseudoaneurysm reduced partailly in size. The pseudo neck remains in size from prior to compression.  The neck measures approximately 0.3 cm wide and 1.8 cm long.     Landry Mellow, RDMS, RVT Lita Mains, RMDS, RVT

## 2018-05-25 ENCOUNTER — Inpatient Hospital Stay (HOSPITAL_COMMUNITY): Payer: Medicare Other

## 2018-05-25 DIAGNOSIS — I724 Aneurysm of artery of lower extremity: Secondary | ICD-10-CM

## 2018-05-25 LAB — BASIC METABOLIC PANEL
Anion gap: 10 (ref 5–15)
BUN: 30 mg/dL — ABNORMAL HIGH (ref 8–23)
CO2: 19 mmol/L — AB (ref 22–32)
Calcium: 8.5 mg/dL — ABNORMAL LOW (ref 8.9–10.3)
Chloride: 107 mmol/L (ref 98–111)
Creatinine, Ser: 1.8 mg/dL — ABNORMAL HIGH (ref 0.44–1.00)
GFR calc Af Amer: 31 mL/min — ABNORMAL LOW (ref >60)
GFR calc non Af Amer: 27 mL/min — ABNORMAL LOW (ref >60)
Glucose, Bld: 133 mg/dL — ABNORMAL HIGH (ref 70–99)
Potassium: 4.5 mmol/L (ref 3.5–5.1)
Sodium: 136 mmol/L (ref 135–145)

## 2018-05-25 LAB — CBC
HCT: 28.7 % — ABNORMAL LOW (ref 36.0–46.0)
Hemoglobin: 9.2 g/dL — ABNORMAL LOW (ref 12.0–15.0)
MCH: 28.6 pg (ref 26.0–34.0)
MCHC: 32.1 g/dL (ref 30.0–36.0)
MCV: 89.1 fL (ref 80.0–100.0)
Platelets: 118 10*3/uL — ABNORMAL LOW (ref 150–400)
RBC: 3.22 MIL/uL — ABNORMAL LOW (ref 3.87–5.11)
RDW: 13.3 % (ref 11.5–15.5)
WBC: 4.8 10*3/uL (ref 4.0–10.5)
nRBC: 0 % (ref 0.0–0.2)

## 2018-05-25 LAB — CBC WITH DIFFERENTIAL/PLATELET
Abs Immature Granulocytes: 0.02 10*3/uL (ref 0.00–0.07)
Basophils Absolute: 0 10*3/uL (ref 0.0–0.1)
Basophils Relative: 1 %
Eosinophils Absolute: 0.1 10*3/uL (ref 0.0–0.5)
Eosinophils Relative: 3 %
HEMATOCRIT: 24.6 % — AB (ref 36.0–46.0)
HEMOGLOBIN: 7.7 g/dL — AB (ref 12.0–15.0)
Immature Granulocytes: 1 %
LYMPHS ABS: 1 10*3/uL (ref 0.7–4.0)
Lymphocytes Relative: 25 %
MCH: 27.5 pg (ref 26.0–34.0)
MCHC: 31.3 g/dL (ref 30.0–36.0)
MCV: 87.9 fL (ref 80.0–100.0)
Monocytes Absolute: 0.4 10*3/uL (ref 0.1–1.0)
Monocytes Relative: 9 %
Neutro Abs: 2.6 10*3/uL (ref 1.7–7.7)
Neutrophils Relative %: 61 %
Platelets: 108 10*3/uL — ABNORMAL LOW (ref 150–400)
RBC: 2.8 MIL/uL — ABNORMAL LOW (ref 3.87–5.11)
RDW: 13.4 % (ref 11.5–15.5)
WBC: 4.1 10*3/uL (ref 4.0–10.5)
nRBC: 0 % (ref 0.0–0.2)

## 2018-05-25 LAB — GLUCOSE, CAPILLARY
Glucose-Capillary: 120 mg/dL — ABNORMAL HIGH (ref 70–99)
Glucose-Capillary: 84 mg/dL (ref 70–99)
Glucose-Capillary: 117 mg/dL — ABNORMAL HIGH (ref 70–99)
Glucose-Capillary: 144 mg/dL — ABNORMAL HIGH (ref 70–99)

## 2018-05-25 LAB — TYPE AND SCREEN
ABO/RH(D): O POS
Antibody Screen: NEGATIVE

## 2018-05-25 LAB — PREPARE RBC (CROSSMATCH)

## 2018-05-25 MED ORDER — LIDOCAINE-EPINEPHRINE 1 %-1:100000 IJ SOLN
20.0000 mL | Freq: Once | INTRAMUSCULAR | Status: DC
  Filled 2018-05-25: qty 20

## 2018-05-25 MED ORDER — THROMBIN FOR PERCUTANEOUS TREATMENT OF PSEUDOANEURYSM (5000UNITS/10ML)
Freq: Once | PERCUTANEOUS | Status: AC
  Administered 2018-05-25: 14:00:00 via PERCUTANEOUS
  Filled 2018-05-25: qty 1

## 2018-05-25 MED ORDER — THROMBIN 20000 UNITS EX KIT
20000.0000 [IU] | PACK | Freq: Once | CUTANEOUS | Status: DC
  Filled 2018-05-25: qty 1

## 2018-05-25 MED ORDER — SODIUM CHLORIDE 0.9% IV SOLUTION: Freq: Once | INTRAVENOUS | Status: DC

## 2018-05-25 MED ORDER — ISOSORBIDE MONONITRATE ER 30 MG PO TB24
15.0000 mg | ORAL_TABLET | Freq: Every day | ORAL | Status: DC
  Administered 2018-05-25: 15 mg via ORAL
  Filled 2018-05-25: qty 1

## 2018-05-25 NOTE — Progress Notes (Signed)
Progress Note  Patient Name: Joyce [name] Date of Encounter: 05/25/2018  Primary Cardiologist: Dr. Satira Sark  Subjective   No active chest pain, had some "soreness" in her chest last night.  No shortness of breath or palpitations.  No abdominal pain or leg pain.  Inpatient Medications    Scheduled Meds: . sodium chloride   Intravenous Once  . amLODipine  10 mg Oral Daily  . angioplasty book   Does not apply Once  . arformoterol  15 mcg Nebulization BID  . aspirin  81 mg Oral Daily  . atorvastatin  40 mg Oral q1800  . clopidogrel  75 mg Oral Q breakfast  . ferrous sulfate  325 mg Oral Daily  . fluticasone  2 spray Each Nare Daily  . heparin  5,000 Units Subcutaneous Q8H  . hydrALAZINE  100 mg Oral Q8H  . insulin aspart  0-20 Units Subcutaneous TID WC  . insulin glargine  40 Units Subcutaneous QHS  . lidocaine-EPINEPHrine  20 mL Intradermal Once  . lisinopril  5 mg Oral Daily  . primidone  50 mg Oral Daily  . sodium chloride flush  3 mL Intravenous Q12H  . thrombin 5,000 units for percutaneous treatment of pseudoaneurysm   Percutaneous Once  . umeclidinium bromide  1 puff Inhalation Daily  . vitamin C  500 mg Oral Daily   Continuous Infusions: . sodium chloride    . nitroGLYCERIN Stopped (05/22/18 2030)   PRN Meds: sodium chloride, acetaminophen, albuterol, ALPRAZolam, HYDROcodone-acetaminophen, morphine injection, nitroGLYCERIN, ondansetron (ZOFRAN) IV, polyethylene glycol, sodium chloride flush, traMADol   Vital Signs    Vitals:   05/24/18 1900 05/24/18 2237 05/25/18 0436 05/25/18 0640  BP: (!) 156/47 (!) 159/62 (!) 158/60 (!) 163/70  Pulse:      Resp:      Temp:      TempSrc:      SpO2:      Weight:   92.2 kg   Height:        Intake/Output Summary (Last 24 hours) at 05/25/2018 0837 Last data filed at 05/24/2018 2152 Gross per 24 hour  Intake -  Output 850 ml  Net -850 ml   Filed Weights   05/21/18 2207 05/23/18 0630 05/25/18 0436    Weight: 92.2 kg 92 kg 92.2 kg    Telemetry    Sinus rhythm.  Personally reviewed.  Physical Exam   GEN:  Obese woman.  No acute distress.   Neck: No JVD. Cardiac: RRR, no gallop.  Respiratory: Nonlabored. Clear to auscultation bilaterally. GI: Soft, nontender, bowel sounds present. MS: No edema; No deformity.  Left femoral bruit. Neuro:  Nonfocal. Psych: Alert and oriented x 3. Normal affect.  Labs    Chemistry Recent Labs  Lab 05/23/18 0225 05/24/18 0415 05/25/18 0322  NA 136 135 136  K 4.1 3.9 4.5  CL 111 111 107  CO2 19* 19* 19*  GLUCOSE 115* 116* 133*  BUN 25* 25* 30*  CREATININE 1.64* 1.80* 1.80*  CALCIUM 8.3* 8.5* 8.5*  GFRNONAA 30* 27* 27*  GFRAA 35* 31* 31*  ANIONGAP 6 5 10      Hematology Recent Labs  Lab 05/23/18 1507 05/24/18 0415 05/25/18 0322  WBC 3.8* 3.9* 4.1  RBC 2.86* 2.93* 2.80*  HGB 8.0* 8.1* 7.7*  HCT 24.8* 25.7* 24.6*  MCV 86.7 87.7 87.9  MCH 28.0 27.6 27.5  MCHC 32.3 31.5 31.3  RDW 13.2 13.3 13.4  PLT 97* 97* 108*    Radiology  Vas [place] Groin Pseudoaneurysm  Result Date: 05/23/2018  ARTERIAL PSEUDOANEURYSM  Exam: Left groin History: S/p catheterization. Performing Technologist: Landry Mellow RDMS, RVT  Examination Guidelines: A complete evaluation includes B-mode imaging, spectral Doppler, color Doppler, and power Doppler as needed of all accessible portions of each vessel. Bilateral testing is considered an integral part of a complete examination. Limited examinations for reoccurring indications may be performed as noted. +-----------+----------+---------+------+----------+ Left DuplexPSV (cm/s)Waveform PlaqueComment(s) +-----------+----------+---------+------+----------+ CFA           255    triphasic                 +-----------+----------+---------+------+----------+ Left Vein comments: Patent femoral vein by Color Doppler  Summary: Left groin: Appears to be a small pseudoaneurysm measuring 1.89 cm off the left common  femoral artery. The pseudo neck is long measuring 0.4 cm in width and 1.84 cm in length. Diagnosing physician: Servando Snare MD Electronically signed by Servando Snare MD on 05/23/2018 at 10:29:56 AM.   --------------------------------------------------------------------------------    Final    Arterial Pseudoaneurysm Compression  Result Date: 05/24/2018  ARTERIAL PSEUDOANEURYSM  Exam: Left groin History: Pseudoaneurysm with long neck. Limitations: body habitus, increased patient pain, high blood pressure 155/80 Performing Technologist: BYNUM, [place], C Supporting Technologist: Landry Mellow RDMS, RVT  Examination Guidelines: A complete evaluation includes B-mode imaging, spectral Doppler, color Doppler, and power Doppler as needed of all accessible portions of each vessel. Bilateral testing is considered an integral part of a complete examination. Limited examinations for reoccurring indications may be performed as noted. +-----------+----------+--------+------+----------+ Left DuplexPSV (cm/s)WaveformPlaqueComment(s) +-----------+----------+--------+------+----------+ CFA           120    biphasic                 +-----------+----------+--------+------+----------+ Left Vein comments: patent CFV  Findings: The neck measures approximately 0.3 cm wide and 1.8 cm long.  Summary: After 30 minutes total compression, the pseudoaneurysm reduced partailly in size. The pseudo neck remains in size from prior to compression. Diagnosing physician: Servando Snare MD Electronically signed by Servando Snare MD on 05/24/2018 at 3:41:58 PM.   --------------------------------------------------------------------------------    Final     Cardiac Studies   PCI 05/22/2018:  Complex with orbital atherectomy followed by stenting of the proximal mid and distal RCA.  The mid and distal lesions were reduced from 99 and 70% stenosis to 0 and 0% stenosis using a 2.75 x 34 mm Onyx postdilated to 3.0 mm in diameter with TIMI  grade III flow.  The proximal 85% RCA with TIMI grade III flow was reduced to 0% using a 22 x 3.5 mm Onyx postdilated to 3.75 mm in diameter with resultant TIMI grade III flow.  RECOMMENDATIONS:   Aspirin and Plavix dual antiplatelet therapy for at least 30 days and preferably for 3 months at which point Plavix monotherapy can be used to complete a 55-month duration of therapy.  Recommend uninterrupted dual antiplatelet therapy with Aspirin 81mg  daily and Clopidogrel 75mg  daily for a minimum of 6 months (stable ischemic heart disease - Class I recommendation).  Cardiac catheterization 05/20/2018:  Prox LAD to Mid LAD lesion is 100% stenosed.  Origin lesion is 100% stenosed.  Origin to Prox Graft lesion is 100% stenosed.  Origin to Prox Graft lesion is 100% stenosed.  Ramus lesion is 95% stenosed.  Prox Cx to Mid Cx lesion is 90% stenosed.  Prox LAD lesion is 80% stenosed.  Prox RCA lesion is 90% stenosed.  Mid RCA lesion is 99% stenosed.  Dist RCA lesion is 60% stenosed.  Echocardiogram 05/19/2018: Study Conclusions  - Left ventricle: The cavity size was normal. Wall thickness was   increased in a pattern of moderate LVH. Systolic function was   normal. The estimated ejection fraction was in the range of 60%   to 65%. Possible basal inferior hypokinesis. Doppler parameters   are consistent with abnormal left ventricular relaxation (grade 1   diastolic dysfunction). The E/e&' ratio is between 8-15,   suggesting indeterminate LV filling pressure. - Mitral valve: Mildly thickened leaflets . There was mild   regurgitation. - Left atrium: The atrium was mildly dilated. - Right atrium: The atrium was mildly dilated. - Inferior vena cava: The vessel was normal in size. The   respirophasic diameter changes were in the normal range (= 50%),   consistent with normal central venous pressure.  Impressions:  - Compared to a prior study in 2018, the LVEF is lower at  60-65%   with moderate LVH and mild biatrial enlargment. There is basal   inferior hypokinesis.  Patient Profile     77 y.o. female with a history of multivessel CAD s/p CABG x4 in 2015, hypertension, hyperlipidemia, type 2 diabetes mellitus, and CKD stage III [facility]  Status post cath with results as above.   Now with anemia requiring transfusion an pseudoaneurysm requiring compression.  Assessment & Plan    1.  NSTEMI, peak troponin I 0.31.  2.  Multivessel CAD status post CABG with recently documented graft disease and status post DES x2 to the proximal, mid, and distal RCA on November 27.  On dual antiplatelet therapy.  3.  Left femoral pseudoaneurysm status post compression and pending thrombin injection today.  She is being followed by Dr. Donzetta Matters.  4.  Acute on chronic blood loss anemia.  She is status post 1 unit PRBCs, current hemoglobin 7.7 down from 8.1.  5.  CKD stage 3, creatinine 1.8.  6.  Hyperlipidemia, on statin therapy.  7.  Essential hypertension, currently on Norvasc, hydralazine, and lisinopril.  8.  Type 2 diabetes mellitus, on insulin.  Patient for thrombin injection of left femoral pseudoaneurysm today.  Also plan additional 1 unit PRBC transfusion with drift down in hemoglobin and recent ACS with intermittent angina.  Continue remaining cardiac regimen including dual antiplatelet therapy.  She has an intolerance to beta-blockers.  Add Imdur to hydralazine.  Possibly increase activity later today and potential discharge tomorrow.  Signed, Rozann Lesches, MD  05/25/2018, 8:37 AM

## 2018-05-25 NOTE — Progress Notes (Signed)
Hgb 7.7 Paged Cards on call with results

## 2018-05-25 NOTE — Op Note (Signed)
    Patient name: Joyce [name] MRN: [id number] DOB: 1940-12-13 Sex: female  05/22/2018 Pre-operative Diagnosis: Left femoral pseudoaneurysm Post-operative diagnosis:  Same Surgeon:  Erlene Quan C. Donzetta Matters, MD Assistant: Leontine Locket, PA Procedure Performed:  Duplex directed thrombin injection left femoral pseudoaneurysm  Indications: 77 year old female is undergone cardiac cath via left femoral approach.  She has persistent common femoral pseudoaneurysm despite compression.  She is now indicated for duplex directed thrombin injection.  Findings: Pseudoaneurysm was actually quite small measuring less than 1 cm.  After injection of thrombin there is no further pseudoaneurysm with triphasic waveforms in the SFA distally as well as a palpable posterior tibial pulse at the ankle.   Procedure:  The patient was identified as [name] and informed consent was obtained.  Timeout was called.  We injected 1% lidocaine overlying the palpable femoral pulse to a total of 20 cc.  The area had been sterilely prepped and draped.  Duplex was used to identify what was very small residual pseudoaneurysm.  This was cannulated with a spinal needle and a total of 3 cc of thrombin was injected.  She tolerated the procedure well.  At completion there were triphasic waveforms in the SFA distally as well as palpable posterior tibial pulse.  Patient can ambulate in 1 hour she is okay for discharge from vascular standpoint can follow-up on an as-needed basis.   [name] C. Donzetta Matters, MD [facility] [place] Office: [phone] Pager: [phone]  \

## 2018-05-25 NOTE — Progress Notes (Addendum)
  Progress Note    05/25/2018 8:44 AM Hospital Day 8  Subjective:  Resting comfortably.  Didn't sleep well last night and sleepy this morning   Vitals:   05/25/18 0436 05/25/18 0640  BP: (!) 158/60 (!) 163/70  Pulse:    Resp:    Temp:    SpO2:      Physical Exam: Cardiac:  regular Lungs:  Non labored Extremities:  Left foot with easily palpable PT pulse  CBC    Component Value Date/Time   WBC 4.1 05/25/2018 0322   RBC 2.80 (L) 05/25/2018 0322   HGB 7.7 (L) 05/25/2018 0322   HCT 24.6 (L) 05/25/2018 0322   PLT 108 (L) 05/25/2018 0322   MCV 87.9 05/25/2018 0322   MCH 27.5 05/25/2018 0322   MCHC 31.3 05/25/2018 0322   RDW 13.4 05/25/2018 0322   LYMPHSABS 1.0 05/25/2018 0322   MONOABS 0.4 05/25/2018 0322   EOSABS 0.1 05/25/2018 0322   BASOSABS 0.0 05/25/2018 0322    BMET    Component Value Date/Time   NA 136 05/25/2018 0322   K 4.5 05/25/2018 0322   CL 107 05/25/2018 0322   CO2 19 (L) 05/25/2018 0322   GLUCOSE 133 (H) 05/25/2018 0322   BUN 30 (H) 05/25/2018 0322   CREATININE 1.80 (H) 05/25/2018 0322   CALCIUM 8.5 (L) 05/25/2018 0322   GFRNONAA 27 (L) 05/25/2018 0322   GFRAA 31 (L) 05/25/2018 0322    INR    Component Value Date/Time   INR 1.13 05/17/2018 2105     Intake/Output Summary (Last 24 hours) at 05/25/2018 0844 Last data filed at 05/24/2018 2152 Gross per 24 hour  Intake -  Output 850 ml  Net -850 ml     Assessment/Plan:  77 y.o. female with left femoral artery psa s/p cardiac catheterization Hospital Day 8  -plan for thrombin injection of psa later this morning -pt with hgb of 7.7 this am-defer to primary team  Leontine Locket, PA-C [facility] [phone] 05/25/2018 8:44 AM   I have interviewed and examined patient with PA and agree with assessment and plan above. Persistent pulsatile mass in left groin. I have discussed risks and benefits of thrombin injection and she agrees to proceed.   Karilynn Carranza C. Donzetta Matters,  MD [facility] [place] Office: [phone] Pager: [phone]

## 2018-05-26 DIAGNOSIS — D500 Iron deficiency anemia secondary to blood loss (chronic): Secondary | ICD-10-CM

## 2018-05-26 DIAGNOSIS — I25119 Atherosclerotic heart disease of native coronary artery with unspecified angina pectoris: Secondary | ICD-10-CM

## 2018-05-26 LAB — CBC
HCT: 27.8 % — ABNORMAL LOW (ref 36.0–46.0)
Hemoglobin: 8.9 g/dL — ABNORMAL LOW (ref 12.0–15.0)
MCH: 28.4 pg (ref 26.0–34.0)
MCHC: 32 g/dL (ref 30.0–36.0)
MCV: 88.8 fL (ref 80.0–100.0)
Platelets: 116 10*3/uL — ABNORMAL LOW (ref 150–400)
RBC: 3.13 MIL/uL — ABNORMAL LOW (ref 3.87–5.11)
RDW: 13.4 % (ref 11.5–15.5)
WBC: 3.9 10*3/uL — ABNORMAL LOW (ref 4.0–10.5)
nRBC: 0 % (ref 0.0–0.2)

## 2018-05-26 LAB — TYPE AND SCREEN
ABO/RH(D): O POS
Antibody Screen: NEGATIVE
UNIT DIVISION: 0
Unit division: 0

## 2018-05-26 LAB — BPAM RBC
Blood Product Expiration Date: 201912272359
Blood Product Expiration Date: 201912272359
ISSUE DATE / TIME: 201911280650
ISSUE DATE / TIME: 201911301036
UNIT TYPE AND RH: 5100
UNIT TYPE AND RH: 5100

## 2018-05-26 LAB — BASIC METABOLIC PANEL
Anion gap: 7 (ref 5–15)
BUN: 38 mg/dL — ABNORMAL HIGH (ref 8–23)
CHLORIDE: 105 mmol/L (ref 98–111)
CO2: 22 mmol/L (ref 22–32)
Calcium: 8.5 mg/dL — ABNORMAL LOW (ref 8.9–10.3)
Creatinine, Ser: 2.03 mg/dL — ABNORMAL HIGH (ref 0.44–1.00)
GFR calc Af Amer: 27 mL/min — ABNORMAL LOW (ref >60)
GFR calc non Af Amer: 23 mL/min — ABNORMAL LOW (ref >60)
Glucose, Bld: 130 mg/dL — ABNORMAL HIGH (ref 70–99)
Potassium: 4.8 mmol/L (ref 3.5–5.1)
Sodium: 134 mmol/L — ABNORMAL LOW (ref 135–145)

## 2018-05-26 LAB — GLUCOSE, CAPILLARY
GLUCOSE-CAPILLARY: 112 mg/dL — AB (ref 70–99)
Glucose-Capillary: 149 mg/dL — ABNORMAL HIGH (ref 70–99)

## 2018-05-26 MED ORDER — ISOSORBIDE MONONITRATE ER 30 MG PO TB24: 30.0000 mg | ORAL_TABLET | Freq: Every day | ORAL | 3 refills | Status: DC

## 2018-05-26 MED ORDER — NITROGLYCERIN 0.4 MG SL SUBL: 0.4000 mg | SUBLINGUAL_TABLET | SUBLINGUAL | 3 refills | Status: DC | PRN

## 2018-05-26 MED ORDER — CLOPIDOGREL BISULFATE 75 MG PO TABS: 75.0000 mg | ORAL_TABLET | Freq: Every day | ORAL | 3 refills | Status: DC

## 2018-05-26 MED ORDER — ISOSORBIDE MONONITRATE ER 30 MG PO TB24
30.0000 mg | ORAL_TABLET | Freq: Every day | ORAL | Status: DC
  Administered 2018-05-26: 30 mg via ORAL
  Filled 2018-05-26: qty 1

## 2018-05-26 MED ORDER — FERROUS SULFATE 325 (65 FE) MG PO TABS: 325.0000 mg | ORAL_TABLET | Freq: Every day | ORAL | 0 refills | Status: DC

## 2018-05-26 NOTE — Progress Notes (Signed)
Progress Note  Patient Name: Joyce [name] Date of Encounter: 05/26/2018  Primary Cardiologist: Dr. Satira Sark  Subjective   No chest pain or shortness of breath.  Appetite somewhat limited.  No leg or abdominal pain.  Inpatient Medications    Scheduled Meds: . sodium chloride   Intravenous Once  . sodium chloride   Intravenous Once  . amLODipine  10 mg Oral Daily  . angioplasty book   Does not apply Once  . arformoterol  15 mcg Nebulization BID  . aspirin  81 mg Oral Daily  . atorvastatin  40 mg Oral q1800  . clopidogrel  75 mg Oral Q breakfast  . ferrous sulfate  325 mg Oral Daily  . fluticasone  2 spray Each Nare Daily  . heparin  5,000 Units Subcutaneous Q8H  . hydrALAZINE  100 mg Oral Q8H  . insulin aspart  0-20 Units Subcutaneous TID WC  . insulin glargine  40 Units Subcutaneous QHS  . isosorbide mononitrate  15 mg Oral Daily  . lidocaine-EPINEPHrine  20 mL Intradermal Once  . lisinopril  5 mg Oral Daily  . primidone  50 mg Oral Daily  . sodium chloride flush  3 mL Intravenous Q12H  . thrombin 5,000 units for percutaneous treatment of pseudoaneurysm   Percutaneous Once  . umeclidinium bromide  1 puff Inhalation Daily  . vitamin C  500 mg Oral Daily   Continuous Infusions: . sodium chloride     PRN Meds: sodium chloride, acetaminophen, albuterol, ALPRAZolam, HYDROcodone-acetaminophen, morphine injection, nitroGLYCERIN, ondansetron (ZOFRAN) IV, polyethylene glycol, sodium chloride flush, traMADol   Vital Signs    Vitals:   05/25/18 1346 05/25/18 2116 05/25/18 2147 05/26/18 0537  BP:  (!) 154/65  (!) 157/67  Pulse: 71   66  Resp:    17  Temp: 98.2 F (36.8 C) 98.6 F (37 C)  98.5 F (36.9 C)  TempSrc: Oral Oral  Oral  SpO2: 96%  98% 97%  Weight:    91.9 kg  Height:        Intake/Output Summary (Last 24 hours) at 05/26/2018 0737 Last data filed at 05/25/2018 2121 Gross per 24 hour  Intake 466.33 ml  Output 1500 ml  Net -1033.67 ml    Filed Weights   05/23/18 0630 05/25/18 0436 05/26/18 0537  Weight: 92 kg 92.2 kg 91.9 kg    Telemetry    Sinus rhythm.  Personally reviewed.  Physical Exam   GEN:  Obese woman.  No acute distress.   Neck: No JVD. Cardiac: RRR, no gallop.  Respiratory: Nonlabored. Clear to auscultation bilaterally. GI: Soft, ecchymoses at injection sites. MS: No edema; improvement of prior left femoral bruit. Neuro:  Nonfocal. Psych: Alert and oriented x 3. Normal affect.  Labs    Chemistry Recent Labs  Lab 05/24/18 0415 05/25/18 0322 05/26/18 0427  NA 135 136 134*  K 3.9 4.5 4.8  CL 111 107 105  CO2 19* 19* 22  GLUCOSE 116* 133* 130*  BUN 25* 30* 38*  CREATININE 1.80* 1.80* 2.03*  CALCIUM 8.5* 8.5* 8.5*  GFRNONAA 27* 27* 23*  GFRAA 31* 31* 27*  ANIONGAP 5 10 7      Hematology Recent Labs  Lab 05/25/18 0322 05/25/18 2104 05/26/18 0427  WBC 4.1 4.8 3.9*  RBC 2.80* 3.22* 3.13*  HGB 7.7* 9.2* 8.9*  HCT 24.6* 28.7* 27.8*  MCV 87.9 89.1 88.8  MCH 27.5 28.6 28.4  MCHC 31.3 32.1 32.0  RDW 13.4 13.3 13.4  PLT 108* 118* 116*    Radiology    Arterial Pseudoaneurysm Compression  Result Date: 05/24/2018  ARTERIAL PSEUDOANEURYSM  Exam: Left groin History: Pseudoaneurysm with long neck. Limitations: body habitus, increased patient pain, high blood pressure 155/80 Performing Technologist: BYNUM, CHARLOT[place], C Supporting Technologist: Landry Mellow RDMS, RVT  Examination Guidelines: A complete evaluation includes B-mode imaging, spectral Doppler, color Doppler, and power Doppler as needed of all accessible portions of each vessel. Bilateral testing is considered an integral part of a complete examination. Limited examinations for reoccurring indications may be performed as noted. +-----------+----------+--------+------+----------+ Left DuplexPSV (cm/s)WaveformPlaqueComment(s) +-----------+----------+--------+------+----------+ CFA           120    biphasic                  +-----------+----------+--------+------+----------+ Left Vein comments: patent CFV  Findings: The neck measures approximately 0.3 cm wide and 1.8 cm long.  Summary: After 30 minutes total compression, the pseudoaneurysm reduced partailly in size. The pseudo neck remains in size from prior to compression. Diagnosing physician: Servando Snare MD Electronically signed by Servando Snare MD on 05/24/2018 at 3:41:58 PM.   --------------------------------------------------------------------------------    Final     Cardiac Studies   PCI 05/22/2018:  Complex with orbital atherectomy followed by stenting of the proximal mid and distal RCA.  The mid and distal lesions were reduced from 99 and 70% stenosis to 0 and 0% stenosis using a 2.75 x 34 mm Onyx postdilated to 3.0 mm in diameter with TIMI grade III flow.  The proximal 85% RCA with TIMI grade III flow was reduced to 0% using a 22 x 3.5 mm Onyx postdilated to 3.75 mm in diameter with resultant TIMI grade III flow.  RECOMMENDATIONS:   Aspirin and Plavix dual antiplatelet therapy for at least 30 days and preferably for 3 months at which point Plavix monotherapy can be used to complete a 6-month duration of therapy.  Recommend uninterrupted dual antiplatelet therapy with Aspirin 81mg  daily and Clopidogrel 75mg  dailyfor a minimum of 6 months (stable ischemic heart disease - Class I recommendation).  Cardiac catheterization 05/20/2018:  Prox LAD to Mid LAD lesion is 100% stenosed.  Origin lesion is 100% stenosed.  Origin to Prox Graft lesion is 100% stenosed.  Origin to Prox Graft lesion is 100% stenosed.  Ramus lesion is 95% stenosed.  Prox Cx to Mid Cx lesion is 90% stenosed.  Prox LAD lesion is 80% stenosed.  Prox RCA lesion is 90% stenosed.  Mid RCA lesion is 99% stenosed.  Dist RCA lesion is 60% stenosed.  Echocardiogram 05/19/2018: Study Conclusions  - Left ventricle: The cavity size was normal. Wall thickness  was increased in a pattern of moderate LVH. Systolic function was normal. The estimated ejection fraction was in the range of 60% to 65%. Possible basal inferior hypokinesis. Doppler parameters are consistent with abnormal left ventricular relaxation (grade 1 diastolic dysfunction). The E/e&' ratio is between 8-15, suggesting indeterminate LV filling pressure. - Mitral valve: Mildly thickened leaflets . There was mild regurgitation. - Left atrium: The atrium was mildly dilated. - Right atrium: The atrium was mildly dilated. - Inferior vena cava: The vessel was normal in size. The respirophasic diameter changes were in the normal range (= 50%), consistent with normal central venous pressure.  Impressions:  - Compared to a prior study in 2018, the LVEF is lower at 60-65% with moderate LVH and mild biatrial enlargment. There is basal inferior hypokinesis.  Patient Profile     77 y.o. female  with a history of multivessel CAD s/p CABG x4 in 2015, hypertension, hyperlipidemia, type 2 diabetes mellitus, and CKD stage III [facility]. Status post cath with results as above. Now with anemia requiring transfusion an pseudoaneurysm requiring compression.  Assessment & Plan    1.  NSTEMI, peak troponin I 0.31.  2.  Multivessel CAD status post CABG with recently documented graft disease, now status post DES x2 to the proximal, mid, and distal RCA on November 27.  No active angina at this time, continues on medical therapy including dual antiplatelet regimen.  3.  Acute on chronic blood loss anemia.  Received additional unit of PRBCs yesterday, hemoglobin up to 9.2 and subsequently 8.9.  4.  Left femoral pseudoaneurysm status post compression and ultimately thrombin injection with resolution document yesterday.  Evaluated by Dr. Donzetta Matters.  5.  CKD stage 3, creatinine 2.0.  6.  Hyperlipidemia, continues on statin  therapy.  7.  Essential hypertension.  8.  Type 2 diabetes mellitus.  Discussed with patient.  She feels like she is ready to go home today.  Will need to increase activity slowly at home, she is using a walker.  Also arrange follow-up office visit either in [place] or [place] within the next 2 weeks.  She will need a CBC and BMET for that visit.  Discharge on current doses of cardiac medications, Imdur is a recent addition.  Also suggest iron supplement.  Signed, Rozann Lesches, MD  05/26/2018, 7:37 AM

## 2018-05-26 NOTE — Discharge Instructions (Signed)
Heart Attack A heart attack (myocardial infarction, MI) causes damage to the heart that cannot be fixed. A heart attack often happens when a blood clot or other blockage cuts blood flow to the heart. When this happens, certain areas of the heart begin to die. This causes the pain you feel during a heart attack. Follow these instructions at home:  Take medicine as told by your doctor. You may need medicine to: ? Keep your blood from clotting too easily. ? Control your blood pressure. ? Lower your cholesterol. ? Control abnormal heart rhythms.  Change certain behaviors as told by your doctor. This may include: ? Quitting smoking. ? Being active. ? Eating a heart-healthy diet. Ask your doctor for help with this diet. ? Keeping a healthy weight. ? Keeping your diabetes under control. ? Lessening stress. ? Limiting how much alcohol you drink. Do not take these medicines unless your doctor says that you can:  Nonsteroidal anti-inflammatory drugs (NSAIDs). These include: ? Ibuprofen. ? Naproxen. ? Celecoxib.  Vitamin supplements that have vitamin A, vitamin E, or both.  Hormone therapy that contains estrogen with or without progestin.  Get help right away if:  You have sudden chest discomfort.  You have sudden discomfort in your: ? Arms. ? Back. ? Neck. ? Jaw.  You have shortness of breath at any time.  You have sudden sweating or clammy skin.  You feel sick to your stomach (nauseous) or throw up (vomit).  You suddenly get light-headed or dizzy.  You feel your heart beating fast or skipping beats. These symptoms may be an emergency. Do not wait to see if the symptoms will go away. Get medical help right away. Call your local emergency services (911 in the [place] Do not drive yourself to the hospital. This information is not intended to replace advice given to you by your health care provider. Make sure you discuss any questions you have with your health care  provider. Document Released: 12/12/2011 Document Revised: 11/18/2015 Document Reviewed: 08/15/2013 Elsevier Interactive Patient Education  2017 [employer]  

## 2018-05-26 NOTE — Discharge Summary (Signed)
Discharge Summary    Patient ID: Joyce [name] MRN: [id number]; DOB: 1940/11/19  Admit date: 05/17/2018 Discharge date: 05/26/2018  Primary Care Provider: Allie Dimmer, MD  Primary Cardiologist: Rozann Lesches, MD  Primary Electrophysiologist:  None   Discharge Diagnoses    Principal Problem:   NSTEMI (non-ST elevated myocardial infarction) [facility]) Active Problems:   Chronic kidney disease (CKD), stage III (moderate) ([place])   Essential hypertension   Diabetes mellitus type 2 in obese ([place])   S/P CABG x 4   COPD with asthma ([place])   Femoral artery pseudo-aneurysm, left ([place])   Acute on chronic blood loss anemia   Coronary artery disease involving native coronary artery of native heart with unstable angina pectoris (HCC)   S/P right coronary artery (RCA) stent placement   Allergies Allergies  Allergen Reactions  . Carvedilol Other (See Comments)    confusion  . Codeine Other (See Comments)    confusion  . Metoprolol Palpitations    Diagnostic Studies/Procedures    Echo 05/19/2018 LV EF: 60% -   65% Study Conclusions  - Left ventricle: The cavity size was normal. Wall thickness was   increased in a pattern of moderate LVH. Systolic function was   normal. The estimated ejection fraction was in the range of 60%   to 65%. Possible basal inferior hypokinesis. Doppler parameters   are consistent with abnormal left ventricular relaxation (grade 1   diastolic dysfunction). The E/e&' ratio is between 8-15,   suggesting indeterminate LV filling pressure. - Mitral valve: Mildly thickened leaflets . There was mild   regurgitation. - Left atrium: The atrium was mildly dilated. - Right atrium: The atrium was mildly dilated. - Inferior vena cava: The vessel was normal in size. The   respirophasic diameter changes were in the normal range (= 50%),   consistent with normal central venous pressure.  Impressions:  - Compared to a prior study in 2018, the LVEF is lower at  60-65%   with moderate LVH and mild biatrial enlargment. There is basal   inferior hypokinesis.   Cath 05/20/2018  Prox LAD to Mid LAD lesion is 100% stenosed.  Origin lesion is 100% stenosed.  Origin to Prox Graft lesion is 100% stenosed.  Origin to Prox Graft lesion is 100% stenosed.  Ramus lesion is 95% stenosed.  Prox Cx to Mid Cx lesion is 90% stenosed.  Prox LAD lesion is 80% stenosed.  Prox RCA lesion is 90% stenosed.  Mid RCA lesion is 99% stenosed.  Dist RCA lesion is 60% stenosed.   Cath 05/22/2018  Complex with orbital atherectomy followed by stenting of the proximal mid and distal RCA.  The mid and distal lesions were reduced from 99 and 70% stenosis to 0 and 0% stenosis using a 2.75 x 34 mm Onyx postdilated to 3.0 mm in diameter with TIMI grade III flow.  The proximal 85% RCA with TIMI grade III flow was reduced to 0% using a 22 x 3.5 mm Onyx postdilated to 3.75 mm in diameter with resultant TIMI grade III flow.  RECOMMENDATIONS:   Aspirin and Plavix dual antiplatelet therapy for at least 30 days and preferably for 3 months at which point Plavix monotherapy can be used to complete a 102-month duration of therapy.  Recommend uninterrupted dual antiplatelet therapy with Aspirin 81mg  daily and Clopidogrel 75mg  daily for a minimum of 6 months (stable ischemic heart disease - Class I recommendation). _____________   History of Present Illness     Joyce [name] is  a 77 year old female with a history of multivessel CAD s/p CABG x4 in 2015, hypertension, hyperlipidemia, type 2 diabetes mellitus, and CKD stage III who presented to [facility] ED for evaluation of chest pain that started this morning. Patient reports onset of substernal pressure with radiation to left arm and up left jaw at rest this morning around 8:30am. Patient states it felt like "something was sitting on her chest." Patient reports associated shorntess of breath and palpitations but no  diaphoresis, nausea/vomiting, or lightheadedness/dizziness. Symptoms resolved and have not returned since receiving several medications in the ED. Patient denies any exertional chest pain. However, she does report dyspnea on exertion and easily fatigability during PT session over the last 2 weeks which she has never noticed before as well as some recent orthopnea and lower extremity edema. She currently takse Lasix 40mg  four days a week and 20mg  three days a week. Patient denies any recent fevers or illness. She denies any tobacco or alcohol use. She does have a family history of heart disease on father's side of family with paternal grandfather and three of patient's paternal uncles having heart attacks.   Hospital Course     Consultants: Dr. Donzetta Matters of Vascular surgery  Patient was admitted under cardiology service.  She was seen initially felt to be mildly volume overloaded and was given a single dose of IV Lasix.  Lipid panel obtained on 05/18/2018 showed total cholesterol 111, HDL 41, LDL 47, triglyceride 116.  Troponin peaked at 0.31 before trending back down.  Echocardiogram obtained on 05/19/2018 showed EF 60 to 65%, moderate LVH, grade 1 DD, basal inferior hypokinesis.  Initial cardiac catheterization performed on 05/20/2018 showed that all of her vein grafts is occluded, patent LIMA to LAD.  High-grade calcified proximal to distal dominant RCA stenosis along with high-grade mid ramus stenosis proximal to the occluded vein graft.  Staged PCI was performed on 05/22/2018 and mid to distal RCA lesion was treated with 2.75 x 34 mm onyx DES, proximal RCA lesion was also treated with a 3.5 x 22 mm Onyx DES.  Postprocedure, patient has been continued on aspirin Plavix.  It is recommended for the patient to continue on dual antiplatelet therapy for at least 30 days but preferably for at least 3 months at which point Plavix monotherapy can be used to complete a 80-month duration of the therapy.  Post procedure  course was complicated by the development of a left femoral artery pseudoaneurysm.  Unfortunately, pseudoaneurysm compression was only partially successful.  Vascular surgery was consulted and the patient underwent direct thrombin injection.  Postprocedure, there is no further pseudoaneurysm, patient has triphasic waveform in the SFA distally.  Patient was seen in the morning of 05/26/2018, at which time she was doing well from cardiology perspective.  She denies any chest pain or shortness of breath.  During this admission, patient has received packed red blood cell transfusion for hemoglbin down to 7.7. Hemoglobin on discharge is 8.9.  I will arrange 2 weeks follow-up in our [place] or [place] office, she will require CBC and basic metabolic panel on follow-up.  _____________  Discharge Vitals Blood pressure (!) 157/67, pulse 66, temperature 98.5 F (36.9 C), temperature source Oral, resp. rate 17, height 5\' 5"  (1.651 m), weight 91.9 kg, SpO2 97 %.  Filed Weights   05/23/18 0630 05/25/18 0436 05/26/18 0537  Weight: 92 kg 92.2 kg 91.9 kg    Labs & Radiologic Studies    CBC Recent Labs  05/23/18 1507  05/25/18 0322 05/25/18 2104 05/26/18 0427  WBC 3.8*   < > 4.1 4.8 3.9*  NEUTROABS 2.5  --  2.6  --   --   HGB 8.0*   < > 7.7* 9.2* 8.9*  HCT 24.8*   < > 24.6* 28.7* 27.8*  MCV 86.7   < > 87.9 89.1 88.8  PLT 97*   < > 108* 118* 116*   < > = values in this interval not displayed.   Basic Metabolic Panel Recent Labs    05/25/18 0322 05/26/18 0427  NA 136 134*  K 4.5 4.8  CL 107 105  CO2 19* 22  GLUCOSE 133* 130*  BUN 30* 38*  CREATININE 1.80* 2.03*  CALCIUM 8.5* 8.5*   Liver Function Tests No results for input(s): AST, ALT, ALKPHOS, BILITOT, PROT, ALBUMIN in the last 72 hours. No results for input(s): LIPASE, AMYLASE in the last 72 hours. Cardiac Enzymes No results for input(s): CKTOTAL, CKMB, CKMBINDEX, TROPONINI in the last 72 hours. BNP Invalid input(s):  POCBNP D-Dimer No results for input(s): DDIMER in the last 72 hours. Hemoglobin A1C No results for input(s): HGBA1C in the last 72 hours. Fasting Lipid Panel No results for input(s): CHOL, HDL, LDLCALC, TRIG, CHOLHDL, LDLDIRECT in the last 72 hours. Thyroid Function Tests No results for input(s): TSH, T4TOTAL, T3FREE, THYROIDAB in the last 72 hours.  Invalid input(s): FREET3 _____________  [place] Groin Pseudoaneurysm  Result Date: 05/23/2018  ARTERIAL PSEUDOANEURYSM  Exam: Left groin History: S/p catheterization. Performing Technologist: Landry Mellow RDMS, RVT  Examination Guidelines: A complete evaluation includes B-mode imaging, spectral Doppler, color Doppler, and power Doppler as needed of all accessible portions of each vessel. Bilateral testing is considered an integral part of a complete examination. Limited examinations for reoccurring indications may be performed as noted. +-----------+----------+---------+------+----------+ Left DuplexPSV (cm/s)Waveform PlaqueComment(s) +-----------+----------+---------+------+----------+ CFA           255    triphasic                 +-----------+----------+---------+------+----------+ Left Vein comments: Patent femoral vein by Color Doppler  Summary: Left groin: Appears to be a small pseudoaneurysm measuring 1.89 cm off the left common femoral artery. The pseudo neck is long measuring 0.4 cm in width and 1.84 cm in length. Diagnosing physician: Servando Snare MD Electronically signed by Servando Snare MD on 05/23/2018 at 10:29:56 AM.   --------------------------------------------------------------------------------    Final    Arterial Pseudoaneurysm Compression  Result Date: 05/24/2018  ARTERIAL PSEUDOANEURYSM  Exam: Left groin History: Pseudoaneurysm with long neck. Limitations: body habitus, increased patient pain, high blood pressure 155/80 Performing Technologist: BYNUM, [place], C Supporting Technologist: Landry Mellow RDMS, RVT   Examination Guidelines: A complete evaluation includes B-mode imaging, spectral Doppler, color Doppler, and power Doppler as needed of all accessible portions of each vessel. Bilateral testing is considered an integral part of a complete examination. Limited examinations for reoccurring indications may be performed as noted. +-----------+----------+--------+------+----------+ Left DuplexPSV (cm/s)WaveformPlaqueComment(s) +-----------+----------+--------+------+----------+ CFA           120    biphasic                 +-----------+----------+--------+------+----------+ Left Vein comments: patent CFV  Findings: The neck measures approximately 0.3 cm wide and 1.8 cm long.  Summary: After 30 minutes total compression, the pseudoaneurysm reduced partailly in size. The pseudo neck remains in size from prior to compression. Diagnosing physician: Servando Snare MD Electronically signed by Servando Snare MD on 05/24/2018  at 3:41:58 PM.   --------------------------------------------------------------------------------    Final    Disposition   Pt is being discharged home today in good condition.  Follow-up Plans & Appointments    Follow-up Information    Satira Sark, MD Follow up.   Specialty:  Cardiology Why:  Our scheduler will contact you to arrange a 1-2 weeks followup in our [facility] or [place] office, please give [place] a call if you do not hear from [place] in 3 business days. Thank you.  Contact information: [place] [address] [phone]        Allie Dimmer, MD. Schedule an appointment as soon as possible for a visit.   Specialty:  Internal Medicine Contact information: [place] [address] [phone]          Discharge Instructions    Amb Referral to Cardiac Rehabilitation   Complete by:  As directed    Referring to [facility] CRP 2   Diagnosis:  NSTEMI      Discharge Medications   Allergies as of 05/26/2018       Reactions   Carvedilol Other (See Comments)   confusion   Codeine Other (See Comments)   confusion   Metoprolol Palpitations      Medication List    TAKE these medications   ACCU-CHEK AVIVA PLUS test strip Generic drug:  glucose blood Inject 1 strip as directed 4 (four) times daily.   albuterol 108 (90 Base) MCG/ACT inhaler Commonly known as:  PROVENTIL HFA;VENTOLIN HFA Inhale 2 puffs into the lungs every 4 (four) hours as needed for wheezing or shortness of breath.   amLODipine 10 MG tablet Commonly known as:  NORVASC Take 10 mg by mouth daily.   aspirin EC 81 MG tablet Take 1 tablet (81 mg total) by mouth daily.   atorvastatin 40 MG tablet Commonly known as:  LIPITOR Take 1 tablet (40 mg total) by mouth daily at 6 PM.   busPIRone 5 MG tablet Commonly known as:  BUSPAR Take 5 mg by mouth 3 (three) times daily.   clopidogrel 75 MG tablet Commonly known as:  PLAVIX Take 1 tablet (75 mg total) by mouth daily with breakfast.   DIALYVITE PO Take 1 tablet by mouth daily.   ferrous sulfate 325 (65 FE) MG tablet Take 1 tablet (325 mg total) by mouth daily. What changed:    medication strength  how much to take   furosemide 20 MG tablet Commonly known as:  LASIX Take 20-40 mg by mouth See admin instructions. Take 1 tablet (40mg ) on Sun Tues Thurs Sat, then on all other days Mon Wed Fri take 1/2 tablet (20mg )   GLUCERNA Liqd Take 237 mLs by mouth daily.   hydrALAZINE 100 MG tablet Commonly known as:  APRESOLINE Take 100 mg by mouth 3 (three) times daily.   insulin glargine 100 UNIT/ML injection Commonly known as:  LANTUS Inject 40 Units into the skin at bedtime.   insulin lispro 100 UNIT/ML KiwkPen Commonly known as:  HUMALOG Inject 8 Units into the skin 3 (three) times daily with meals.   INSULIN SYRINGE .5CC/30GX5/16" 30G X 5/16" 0.5 ML Misc Inject 0.5 mLs as directed daily.   INSULIN SYRINGE .5CC/30GX5/16" 30G X 5/16" 0.5 ML Misc Inject 0.5 mLs as  directed 3 (three) times daily.   isosorbide mononitrate 30 MG 24 hr tablet Commonly known as:  IMDUR Take 1 tablet (30 mg total) by mouth daily.   lisinopril 5 MG  tablet Commonly known as:  PRINIVIL,ZESTRIL Take 5 mg by mouth daily.   nitroGLYCERIN 0.4 MG SL tablet Commonly known as:  NITROSTAT Place 1 tablet (0.4 mg total) under the tongue every 5 (five) minutes x 3 doses as needed for chest pain.   polyethylene glycol packet Commonly known as:  MIRALAX / GLYCOLAX Take 17 g by mouth daily as needed for mild constipation.   primidone 50 MG tablet Commonly known as:  MYSOLINE Take 50 mg by mouth daily.   Tiotropium Bromide-Olodaterol 2.5-2.5 MCG/ACT Aers Take 2 puffs by mouth daily.   traMADol 50 MG tablet Commonly known as:  ULTRAM Take 1 tablet (50 mg total) by mouth every 6 (six) hours as needed for moderate pain. What changed:    when to take this  reasons to take this   vitamin C 500 MG tablet Commonly known as:  ASCORBIC ACID Take 500 mg by mouth daily.   Vitamin D3 50 MCG (2000 UT) Tabs Take 2,000 Units by mouth daily.        Acute coronary syndrome (MI, NSTEMI, STEMI, etc) this admission?: Yes.     AHA/ACC Clinical Performance & Quality Measures: 1. Aspirin prescribed? - Yes 2. ADP Receptor Inhibitor (Plavix/Clopidogrel, Brilinta/Ticagrelor or Effient/Prasugrel) prescribed (includes medically managed patients)? - Yes 3. Beta Blocker prescribed? - No - Intolerant - cause confusion, see under allergies 4. High Intensity Statin (Lipitor 40-80mg  or Crestor 20-40mg ) prescribed? - Yes 5. EF assessed during THIS hospitalization? - Yes 6. For EF <40%, was ACEI/ARB prescribed? - Not Applicable (EF >/= 06%) 7. For EF <40%, Aldosterone Antagonist (Spironolactone or Eplerenone) prescribed? - Not Applicable (EF >/= 00%) 8. Cardiac Rehab Phase II ordered (Included Medically managed Patients)? - Yes     Outstanding Labs/Studies   CBC and BMET on  followup   Duration of Discharge Encounter   Greater than 30 minutes including physician time.  Hilbert Corrigan, PA 05/26/2018, 8:20 AM

## 2018-05-26 NOTE — Progress Notes (Signed)
Pt discharged in stable condition with family to private vehicle. No further questions with discharge instructions

## 2018-05-29 ENCOUNTER — Emergency Department (EMERGENCY_DEPARTMENT_HOSPITAL): Payer: Medicare Other

## 2018-05-29 ENCOUNTER — Telehealth: Payer: Self-pay | Admitting: *Deleted

## 2018-05-29 ENCOUNTER — Other Ambulatory Visit: Payer: Self-pay

## 2018-05-29 ENCOUNTER — Emergency Department (HOSPITAL_COMMUNITY)
Admission: EM | Admit: 2018-05-29 | Discharge: 2018-05-29 | Disposition: A | Discharge status: Home / Self Care | Payer: Medicare Other | Attending: Emergency Medicine | Admitting: Emergency Medicine

## 2018-05-29 ENCOUNTER — Encounter (HOSPITAL_COMMUNITY): Payer: Self-pay

## 2018-05-29 DIAGNOSIS — S301XXA Contusion of abdominal wall, initial encounter: Secondary | ICD-10-CM

## 2018-05-29 DIAGNOSIS — Z951 Presence of aortocoronary bypass graft: Secondary | ICD-10-CM | POA: Insufficient documentation

## 2018-05-29 DIAGNOSIS — Z794 Long term (current) use of insulin: Secondary | ICD-10-CM | POA: Diagnosis not present

## 2018-05-29 DIAGNOSIS — N183 Chronic kidney disease, stage 3 (moderate): Secondary | ICD-10-CM | POA: Insufficient documentation

## 2018-05-29 DIAGNOSIS — R2242 Localized swelling, mass and lump, left lower limb: Secondary | ICD-10-CM | POA: Diagnosis present

## 2018-05-29 DIAGNOSIS — E1122 Type 2 diabetes mellitus with diabetic chronic kidney disease: Secondary | ICD-10-CM | POA: Insufficient documentation

## 2018-05-29 DIAGNOSIS — Z79899 Other long term (current) drug therapy: Secondary | ICD-10-CM | POA: Insufficient documentation

## 2018-05-29 DIAGNOSIS — I2510 Atherosclerotic heart disease of native coronary artery without angina pectoris: Secondary | ICD-10-CM | POA: Insufficient documentation

## 2018-05-29 DIAGNOSIS — I97630 Postprocedural hematoma of a circulatory system organ or structure following a cardiac catheterization: Secondary | ICD-10-CM | POA: Diagnosis not present

## 2018-05-29 DIAGNOSIS — I252 Old myocardial infarction: Secondary | ICD-10-CM | POA: Diagnosis not present

## 2018-05-29 DIAGNOSIS — L7632 Postprocedural hematoma of skin and subcutaneous tissue following other procedure: Secondary | ICD-10-CM | POA: Insufficient documentation

## 2018-05-29 DIAGNOSIS — I129 Hypertensive chronic kidney disease with stage 1 through stage 4 chronic kidney disease, or unspecified chronic kidney disease: Secondary | ICD-10-CM | POA: Insufficient documentation

## 2018-05-29 LAB — CBC WITH DIFFERENTIAL/PLATELET
Abs Immature Granulocytes: 0.02 10*3/uL (ref 0.00–0.07)
Basophils Absolute: 0 10*3/uL (ref 0.0–0.1)
Basophils Relative: 0 %
Eosinophils Absolute: 0.1 10*3/uL (ref 0.0–0.5)
Eosinophils Relative: 2 %
HCT: 30.7 % — ABNORMAL LOW (ref 36.0–46.0)
Hemoglobin: 9.7 g/dL — ABNORMAL LOW (ref 12.0–15.0)
Immature Granulocytes: 1 %
Lymphocytes Relative: 25 %
Lymphs Abs: 1 10*3/uL (ref 0.7–4.0)
MCH: 28.5 pg (ref 26.0–34.0)
MCHC: 31.6 g/dL (ref 30.0–36.0)
MCV: 90.3 fL (ref 80.0–100.0)
Monocytes Absolute: 0.4 10*3/uL (ref 0.1–1.0)
Monocytes Relative: 10 %
Neutro Abs: 2.4 10*3/uL (ref 1.7–7.7)
Neutrophils Relative %: 62 %
Platelets: 169 10*3/uL (ref 150–400)
RBC: 3.4 MIL/uL — ABNORMAL LOW (ref 3.87–5.11)
RDW: 13.1 % (ref 11.5–15.5)
WBC: 3.9 10*3/uL — ABNORMAL LOW (ref 4.0–10.5)
nRBC: 0 % (ref 0.0–0.2)

## 2018-05-29 LAB — BASIC METABOLIC PANEL
Anion gap: 10 (ref 5–15)
BUN: 36 mg/dL — ABNORMAL HIGH (ref 8–23)
CO2: 20 mmol/L — ABNORMAL LOW (ref 22–32)
Calcium: 8.9 mg/dL (ref 8.9–10.3)
Chloride: 108 mmol/L (ref 98–111)
Creatinine, Ser: 1.76 mg/dL — ABNORMAL HIGH (ref 0.44–1.00)
GFR calc Af Amer: 32 mL/min — ABNORMAL LOW (ref >60)
GFR calc non Af Amer: 28 mL/min — ABNORMAL LOW (ref >60)
Glucose, Bld: 128 mg/dL — ABNORMAL HIGH (ref 70–99)
Potassium: 5.1 mmol/L (ref 3.5–5.1)
Sodium: 138 mmol/L (ref 135–145)

## 2018-05-29 NOTE — Telephone Encounter (Signed)
Pt says she was d/c'd from [facility] yesterday from cath and blood clot - says the blood clot is back wanted to know if she needed to see Dr Domenic Polite - advised pt she should return to [facility] ED for further evaluation - pt agreeable

## 2018-05-29 NOTE — Discharge Instructions (Signed)
Return to the emergency department for increased swelling, redness, chest pain, or other new and concerning symptoms.  Follow-up with your cardiologist and primary doctor as previously arranged.

## 2018-05-29 NOTE — ED Triage Notes (Signed)
Per pt: "I got a big lump in my left leg. I noticed it this morning when I got out of the shower. I just had one broke up.". Pt has bruising to groin from recent heart cath. Pt had a pseudoaneurysm. Legs are equally pale, pulses are present bilaterally. Pt is able to move foot/toes, PMS is intact bilaterally.

## 2018-05-29 NOTE — ED Provider Notes (Signed)
[place] EMERGENCY DEPARTMENT Provider Note   CSN: [id number] Arrival date & time: 05/29/18  1113     History   Chief Complaint Chief Complaint  Patient presents with  . Groin Swelling    HPI Joyce [name] is a 77 y.o. female.  Patient is a 77 year old female with past medical history of coronary artery disease with CABG and 2015.  She recently underwent a heart cath complicated by a postprocedural pseudoaneurysm in the left groin.  Compression was partially successful and the patient required thrombin injection.  She was discharged 3 days ago.  This morning while showering, she noticed that the large lump returned.  She denies to me she is experiencing any pain, fever.  She denies any chest pain or difficulty breathing.  The history is provided by the patient.    Past Medical History:  Diagnosis Date  . Anemia   . Arthritis   . Bilateral pleural effusion    Postoperative, status post Pleurx catheter and talc treatment - Dr. Prescott Gum  . 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 Chronic kidney disease (CKD), stage III (moderate) (HCC)   . Chronic lower back pain   . Essential hypertension   . Fibromyalgia   . History of pneumonia   . Hyperlipidemia   . Type II diabetes mellitus [facility])     Patient Active Problem List   Diagnosis Date Noted  . 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 Acute on chronic blood loss anemia 05/23/2018  . Coronary artery disease involving native coronary artery of native heart with unstable angina pectoris ([place]) 05/23/2018  . S/P right coronary artery (RCA) stent placement 05/23/2018  . 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 Acute bacterial rhinosinusitis 08/13/2014  . Bilateral pleural effusion 08/04/2014  . Dyspnea   . Leukopenia 07/28/2014  . Normocytic anemia 07/27/2014  . Exudative pleural effusion   . Diastolic  CHF, chronic ([place]) 07/26/2014  . 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 NSTEMI (non-ST elevated myocardial infarction) ([place]) 06/11/2014  . Chronic kidney disease (CKD), stage III (moderate) ([place]) 06/11/2014  . Essential hypertension 06/11/2014  . Diabetes mellitus type 2 in obese ([place]) 06/11/2014    Past Surgical History:  Procedure Laterality Date  . ABDOMINAL HYSTERECTOMY  1980's  . ANTERIOR CERVICAL DECOMP/DISCECTOMY FUSION  2000's  . APPENDECTOMY  1970's  . BACK SURGERY    . CHEST TUBE INSERTION Left 08/07/2014   Procedure: INSERTION PLEURAL DRAINAGE CATHETER;  Surgeon: Ivin Poot, MD;  Location: [place];  Service: Thoracic;  Laterality: Left;  . CHEST TUBE INSERTION Right 08/12/2014   Procedure: INSERTION PLEURAL DRAINAGE CATHETER;  Surgeon: Ivin Poot, MD;  Location: [place];  Service: Thoracic;  Laterality: Right;  . CHOLECYSTECTOMY  ~ 2005  . CORONARY ARTERY BYPASS GRAFT N/A 06/15/2014   Procedure: CORONARY ARTERY BYPASS GRAFTING (CABG);  Surgeon: Ivin Poot, MD;  Location: [place];  Service: Open Heart Surgery;  Laterality: N/A;  . CORONARY ATHERECTOMY N/A 05/22/2018   Procedure: CORONARY ATHERECTOMY;  Surgeon: Belva Crome, MD;  Location: [place] CV LAB;  Service: Cardiovascular;  Laterality: N/A;  . CORONARY STENT INTERVENTION N/A 05/22/2018   Procedure: CORONARY STENT INTERVENTION;  Surgeon: Belva Crome, MD;  Location: [place] CV LAB;  Service: Cardiovascular;  Laterality: N/A;  rca   . INTRAOPERATIVE TRANSESOPHAGEAL ECHOCARDIOGRAM  N/A 06/15/2014   Procedure: INTRAOPERATIVE TRANSESOPHAGEAL ECHOCARDIOGRAM;  Surgeon: Ivin Poot, MD;  Location: [place];  Service: Open Heart Surgery;  Laterality: N/A;  . LEFT HEART CATH AND CORS/GRAFTS ANGIOGRAPHY N/A 05/20/2018   Procedure: LEFT HEART CATH AND CORS/GRAFTS ANGIOGRAPHY;  Surgeon: Lorretta Harp, MD;  Location: [place] CV LAB;  Service: Cardiovascular;  Laterality: N/A;  . LEFT HEART CATHETERIZATION WITH CORONARY  ANGIOGRAM N/A 06/11/2014   Procedure: LEFT HEART CATHETERIZATION WITH CORONARY ANGIOGRAM;  Surgeon: Blane Ohara, MD;  Location: [facility] CATH LAB;  Service: Cardiovascular;  Laterality: N/A;  . LUMBAR DISC SURGERY  1980's X 1; 1990's X  2000's X 1  . REMOVAL OF PLEURAL DRAINAGE CATHETER Bilateral 02/11/2015   Procedure: REMOVAL OF PLEURAL DRAINAGE CATHETER;  Surgeon: Ivin Poot, MD;  Location: [place];  Service: Thoracic;  Laterality: Bilateral;  . TALC PLEURODESIS Bilateral 02/11/2015   Procedure: Pietro Cassis;  Surgeon: Ivin Poot, MD;  Location: [place];  Service: Thoracic;  Laterality: Bilateral;  . TONSILLECTOMY  ~ 1950     OB History   None      Home Medications    Prior to Admission medications   Medication Sig Start Date End Date Taking? Authorizing Provider  ACCU-CHEK AVIVA PLUS test strip Inject 1 strip as directed 4 (four) times daily. 11/16/14   [provider]  albuterol (PROVENTIL HFA;VENTOLIN HFA) 108 (90 Base) MCG/ACT inhaler Inhale 2 puffs into the lungs every 4 (four) hours as needed for wheezing or shortness of breath. 07/17/17   Byrum, Rose Fillers, MD  amLODipine (NORVASC) 10 MG tablet Take 10 mg by mouth daily.  05/28/14   [provider]  aspirin EC 81 MG tablet Take 1 tablet (81 mg total) by mouth daily. 01/25/16   Satira Sark, MD  atorvastatin (LIPITOR) 40 MG tablet Take 1 tablet (40 mg total) by mouth daily at 6 PM. 06/22/14   Nani Skillern, PA-C  B Complex-C-Folic Acid (DIALYVITE PO) Take 1 tablet by mouth daily.  09/22/14   [provider]  busPIRone (BUSPAR) 5 MG tablet Take 5 mg by mouth 3 (three) times daily. 04/29/18   [provider]  Cholecalciferol (VITAMIN D3) 2000 units TABS Take 2,000 Units by mouth daily.     [provider]  clopidogrel (PLAVIX) 75 MG tablet Take 1 tablet (75 mg total) by mouth daily with breakfast. 05/26/18   Almyra Deforest, PA  ferrous sulfate 325 (65 FE) MG tablet Take 1 tablet  (325 mg total) by mouth daily. 05/26/18   Almyra Deforest, PA  furosemide (LASIX) 20 MG tablet Take 20-40 mg by mouth See admin instructions. Take 1 tablet (40mg ) on Sun Tues Thurs Sat, then on all other days Mon Wed Fri take 1/2 tablet (20mg )    [provider]  GLUCERNA (GLUCERNA) LIQD Take 237 mLs by mouth daily.    [provider]  hydrALAZINE (APRESOLINE) 100 MG tablet Take 100 mg by mouth 3 (three) times daily.    [provider]  insulin glargine (LANTUS) 100 UNIT/ML injection Inject 40 Units into the skin at bedtime.     [provider]  insulin lispro (HUMALOG) 100 UNIT/ML KiwkPen Inject 8 Units into the skin 3 (three) times daily with meals.     [provider]  Insulin Syringe-Needle U-100 (INSULIN SYRINGE .5CC/30GX5/16") 30G X 5/16" 0.5 ML MISC Inject 0.5 mLs as directed daily. 01/18/15   [provider]  Insulin Syringe-Needle U-100 (INSULIN  SYRINGE .5CC/30GX5/16") 30G X 5/16" 0.5 ML MISC Inject 0.5 mLs as directed 3 (three) times daily. 01/18/15   [provider]  isosorbide mononitrate (IMDUR) 30 MG 24 hr tablet Take 1 tablet (30 mg total) by mouth daily. 05/26/18   Almyra Deforest, PA  lisinopril (PRINIVIL,ZESTRIL) 5 MG tablet Take 5 mg by mouth daily.     [provider]  nitroGLYCERIN (NITROSTAT) 0.4 MG SL tablet Place 1 tablet (0.4 mg total) under the tongue every 5 (five) minutes x 3 doses as needed for chest pain. 05/26/18   Almyra Deforest, PA  polyethylene glycol (MIRALAX / GLYCOLAX) packet Take 17 g by mouth daily as needed for mild constipation. 08/13/14   Moding, Langley Gauss, MD  primidone (MYSOLINE) 50 MG tablet Take 50 mg by mouth daily.  05/28/14   [provider]  Tiotropium Bromide-Olodaterol (STIOLTO RESPIMAT) 2.5-2.5 MCG/ACT AERS Take 2 puffs by mouth daily. 12/20/17   Collene Gobble, MD  traMADol (ULTRAM) 50 MG tablet Take 1 tablet (50 mg total) by mouth every 6 (six) hours as needed for moderate pain. Patient  taking differently: Take 50 mg by mouth every 4 (four) hours as needed for moderate pain or severe pain.  06/22/14   Nani Skillern, PA-C  vitamin C (ASCORBIC ACID) 500 MG tablet Take 500 mg by mouth daily.    [provider]    Family History Family History  Problem Relation Age of Onset  . Stroke Father   . Diabetes Mother   . Hypertension Mother   . Alcoholism Brother   . Heart disease Sister   . Alcoholism Brother   . CAD Paternal Grandmother        heart attack  . CAD Paternal Uncle        heart attack    Social History Social History   Tobacco Use  . Smoking status: Never Smoker  . Smokeless tobacco: Never Used  Substance Use Topics  . Alcohol use: No    Alcohol/week: 0.0 standard drinks  . Drug use: No     Allergies   Carvedilol; Codeine; and Metoprolol   Review of Systems Review of Systems  All other systems reviewed and are negative.    Physical Exam Updated Vital Signs BP (!) 153/62   Pulse 63   Temp 98.2 F (36.8 C) (Oral)   Resp 16   SpO2 100%   Physical Exam  Constitutional: She is oriented to person, place, and time. She appears well-developed and well-nourished. No distress.  HENT:  Head: Normocephalic and atraumatic.  Neck: Normal range of motion. Neck supple.  Cardiovascular: Normal rate and regular rhythm. Exam reveals no gallop and no friction rub.  No murmur heard. There is bruising and ecchymosis in the left groin.  There is a swollen lump that is palpable.  It is not pulsatile.  DP and PT pulses are palpable distally and motor and sensation are intact throughout the entire leg.  Pulmonary/Chest: Effort normal and breath sounds normal. No respiratory distress. She has no wheezes.  Abdominal: Soft. Bowel sounds are normal. She exhibits no distension. There is no tenderness.  Musculoskeletal: Normal range of motion.  Neurological: She is alert and oriented to person, place, and time.  Skin: Skin is warm and dry. She is  not diaphoretic.  Nursing note and vitals reviewed.    ED Treatments / Results  Labs (all labs ordered are listed, but only abnormal results are displayed) Labs Reviewed  BASIC METABOLIC PANEL  CBC WITH DIFFERENTIAL/PLATELET    EKG None  Radiology No results found.  Procedures Procedures (including critical care time)  Medications Ordered in ED Medications - No data to display   Initial Impression / Assessment and Plan / ED Course  I have reviewed the triage vital signs and the nursing notes.  Pertinent labs & imaging results that were available during my care of the patient were reviewed by me and considered in my medical decision making (see chart for details).  Patient presents here with complaints of swelling in the left groin.  She recently had a heart catheterization complicated by a pseudo-aneurysm.  Her ultrasound today reveals no recurrence of the pseudoaneurysm, just 2 small hematomas in the left groin.  These findings were discussed with Dr. Donnetta Hutching from vascular surgery.  He states the patient is fine to go home with outpatient follow-up as previously scheduled.  Final Clinical Impressions(s) / ED Diagnoses   Final diagnoses:  None    ED Discharge Orders    None       Veryl Speak, MD 05/29/18 1341

## 2018-05-29 NOTE — Progress Notes (Signed)
Left groin limited arterial duplex        has been completed. Preliminary results can be found under CV proc through chart review. Landry Mellow, RDMS, RVT

## 2018-06-03 NOTE — Progress Notes (Signed)
Cardiology Office Note   Date:  06/04/2018   ID:  Joyce [name], DOB 1941/06/21, MRN [id number]  PCP:  Allie Dimmer, MD  Cardiologist: Dr. Domenic [facility])  Chief Complaint  Patient presents with  . Hospitalization Follow-up  . Coronary Artery Disease     History of Present Illness: Joyce [name] is a 77 y.o. female who presents for post hospital follow up after admission for chest pain. Originally seen at [facility] for chest pain and transferred to [facility] for fluid overload. She was given a single dose of lasix. Troponin peaked at 0.31. The patient had cardiac cath on 05/20/2018 which revealed that all of the vein grafts were occluded, LIMA to LAD was patent.   High-grade calcified proximal to distal dominant RCA stenosis along with high-grade mid ramus stenosis proximal to the occluded vein graft.  Staged PCI was performed on 05/22/2018 and mid to distal RCA lesion was treated with 2.75 x 34 mm onyx DES,   She is friendly and very talkative. She has not yet started cardiac rehab as this is planned for [facility]. She denies any further chest pain, jaw pain or dyspnea. She has gotten more active. She is medically compliant.   Past Medical History:  Diagnosis Date  . Anemia   . Arthritis   . Bilateral pleural effusion    Postoperative, status post Pleurx catheter and talc treatment - Dr. Prescott Gum  . 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 Chronic kidney disease (CKD), stage III (moderate) (HCC)   . Chronic lower back pain   . Essential hypertension   . Fibromyalgia   . History of pneumonia   . Hyperlipidemia   . Type II diabetes mellitus ([place])     Past Surgical History:  Procedure Laterality Date  . ABDOMINAL HYSTERECTOMY  1980's  . ANTERIOR CERVICAL DECOMP/DISCECTOMY FUSION  2000's  . APPENDECTOMY  1970's  . BACK SURGERY    . CHEST TUBE INSERTION Left 08/07/2014   Procedure: INSERTION PLEURAL DRAINAGE CATHETER;  Surgeon: Ivin Poot, MD;  Location: [place];  Service: Thoracic;  Laterality: Left;  . CHEST TUBE INSERTION Right 08/12/2014   Procedure: INSERTION PLEURAL DRAINAGE CATHETER;  Surgeon: Ivin Poot, MD;  Location: [place];  Service: Thoracic;  Laterality: Right;  . CHOLECYSTECTOMY  ~ 2005  . CORONARY ARTERY BYPASS GRAFT N/A 06/15/2014   Procedure: CORONARY ARTERY BYPASS GRAFTING (CABG);  Surgeon: Ivin Poot, MD;  Location: [place];  Service: Open Heart Surgery;  Laterality: N/A;  . CORONARY ATHERECTOMY N/A 05/22/2018   Procedure: CORONARY ATHERECTOMY;  Surgeon: Belva Crome, MD;  Location: [place] CV LAB;  Service: Cardiovascular;  Laterality: N/A;  . CORONARY STENT INTERVENTION N/A 05/22/2018   Procedure: CORONARY STENT INTERVENTION;  Surgeon: Belva Crome, MD;  Location: [place] CV LAB;  Service: Cardiovascular;  Laterality: N/A;  rca   . INTRAOPERATIVE TRANSESOPHAGEAL ECHOCARDIOGRAM N/A 06/15/2014   Procedure: INTRAOPERATIVE TRANSESOPHAGEAL ECHOCARDIOGRAM;  Surgeon: Ivin Poot, MD;  Location: [place];  Service: Open Heart Surgery;  Laterality: N/A;  . LEFT HEART CATH AND CORS/GRAFTS ANGIOGRAPHY N/A 05/20/2018   Procedure: LEFT HEART CATH AND CORS/GRAFTS ANGIOGRAPHY;  Surgeon: Lorretta Harp, MD;  Location: [place] CV LAB;  Service: Cardiovascular;  Laterality: N/A;  . LEFT HEART CATHETERIZATION WITH CORONARY ANGIOGRAM N/A 06/11/2014   Procedure: LEFT HEART CATHETERIZATION WITH  CORONARY ANGIOGRAM;  Surgeon: Blane Ohara, MD;  Location[facility] CATH LAB;  Service: Cardiovascular;  Laterality: N/A;  . LUMBAR DISC SURGERY  1980's X 1; 1990's X  2000's X 1  . REMOVAL OF PLEURAL DRAINAGE CATHETER Bilateral 02/11/2015   Procedure: REMOVAL OF PLEURAL DRAINAGE CATHETER;  Surgeon: Ivin Poot, MD;  Location: [place];  Service: Thoracic;  Laterality: Bilateral;  . TALC PLEURODESIS Bilateral 02/11/2015   Procedure: Pietro Cassis;  Surgeon: Ivin Poot, MD;  Location: [place];  Service: Thoracic;  Laterality: Bilateral;  . TONSILLECTOMY  ~ 1950     Current Outpatient Medications  Medication Sig Dispense Refill  . ACCU-CHEK AVIVA PLUS test strip Inject 1 strip as directed 4 (four) times daily.  2  . albuterol (PROVENTIL HFA;VENTOLIN HFA) 108 (90 Base) MCG/ACT inhaler Inhale 2 puffs into the lungs every 4 (four) hours as needed for wheezing or shortness of breath. 1 Inhaler 5  . amLODipine (NORVASC) 10 MG tablet Take 10 mg by mouth daily.   5  . aspirin EC 81 MG tablet Take 1 tablet (81 mg total) by mouth daily.    Marland Kitchen atorvastatin (LIPITOR) 40 MG tablet Take 1 tablet (40 mg total) by mouth daily at 6 PM.    . B Complex-C-Folic Acid (DIALYVITE PO) Take 1 tablet by mouth daily.     . busPIRone (BUSPAR) 5 MG tablet Take 5 mg by mouth 3 (three) times daily.    . Cholecalciferol (VITAMIN D3) 2000 units TABS Take 2,000 Units by mouth daily.     . clopidogrel (PLAVIX) 75 MG tablet Take 1 tablet (75 mg total) by mouth daily with breakfast. 90 tablet 3  . ferrous sulfate 325 (65 FE) MG tablet Take 1 tablet (325 mg total) by mouth daily. 60 tablet 0  . fluticasone (FLONASE) 50 MCG/ACT nasal spray Place into both nostrils as directed.    . furosemide (LASIX) 20 MG tablet Take 20-40 mg by mouth See admin instructions. Take 1 tablet (40mg ) on Sun Tues Thurs Sat, then on all other days Mon Wed Fri take 1/2 tablet (20mg )    . GLUCERNA (GLUCERNA) LIQD Take 237 mLs by mouth daily.    . hydrALAZINE (APRESOLINE) 100 MG tablet Take 100 mg by mouth 3 (three) times daily.    . insulin glargine (LANTUS) 100 UNIT/ML injection Inject 40 Units into the skin at bedtime.     . insulin lispro (HUMALOG) 100 UNIT/ML KiwkPen Inject 8 Units into the skin 3 (three) times daily with meals.     . Insulin Syringe-Needle U-100 (INSULIN SYRINGE .5CC/30GX5/16") 30G X 5/16" 0.5 ML MISC Inject 0.5 mLs as directed daily.    . Insulin Syringe-Needle  U-100 (INSULIN SYRINGE .5CC/30GX5/16") 30G X 5/16" 0.5 ML MISC Inject 0.5 mLs as directed 3 (three) times daily.  3  . isosorbide mononitrate (IMDUR) 30 MG 24 hr tablet Take 1 tablet (30 mg total) by mouth daily. 90 tablet 3  . lisinopril (PRINIVIL,ZESTRIL) 5 MG tablet Take 5 mg by mouth daily.     . nitroGLYCERIN (NITROSTAT) 0.4 MG SL tablet Place 1 tablet (0.4 mg total) under the tongue every 5 (five) minutes x 3 doses as needed for chest pain. 25 tablet 3  . polyethylene glycol (MIRALAX / GLYCOLAX) packet Take 17 g by mouth daily as needed for mild constipation. 14 each 2  . primidone (MYSOLINE) 50 MG tablet Take 50 mg by mouth daily.   5  . Tiotropium Bromide-Olodaterol (STIOLTO RESPIMAT)  2.5-2.5 MCG/ACT AERS Take 2 puffs by mouth daily. 1 Inhaler 5  . traMADol (ULTRAM) 50 MG tablet Take 1 tablet (50 mg total) by mouth every 6 (six) hours as needed for moderate pain. (Patient taking differently: Take 50 mg by mouth every 4 (four) hours as needed for moderate pain or severe pain. ) 30 tablet 0  . vitamin C (ASCORBIC ACID) 500 MG tablet Take 500 mg by mouth daily.     No current facility-administered medications for this visit.     Allergies:   Carvedilol; Codeine; and Metoprolol    Social History:  The patient  reports that she has never smoked. She has never used smokeless tobacco. She reports that she does not drink alcohol or use drugs.   Family History:  The patient's family history includes Alcoholism in her brother and brother; CAD in her paternal grandmother and paternal uncle; Diabetes in her mother; Heart disease in her sister; Hypertension in her mother; Stroke in her father.    ROS: All other systems are reviewed and negative. Unless otherwise mentioned in H&P    PHYSICAL EXAM: VS:  BP (!) 158/64   Pulse 73   Ht 5\' 5"  (1.651 m)   Wt 198 lb 9.6 oz (90.1 kg)   SpO2 97%   BMI 33.05 kg/m  , BMI Body mass index is 33.05 kg/m. GEN: Well nourished, well developed, in no acute  distress HEENT: normal Neck: no JVD, soft left carotid bruits, or masses Cardiac: IRRR; 1/6 systolic murmurs, rubs, or gallops,no edema  Respiratory:  Clear to auscultation bilaterally, normal work of breathing GI: soft, nontender, nondistended, + BS MS: no deformity or atrophy. Left cath insertion site has old ecchymosis no hematoma.  Skin: warm and dry, no rash Neuro:  Strength and sensation are intact Psych: euthymic mood, full affect   EKG:  NSR with frequent PAC's. Rate of 71 bpm.   Recent Labs: 05/29/2018: BUN 36; Creatinine, Ser 1.76; Hemoglobin 9.7; Platelets 169; Potassium 5.1; Sodium 138    Lipid Panel    Component Value Date/Time   CHOL 111 05/18/2018 0418   TRIG 116 05/18/2018 0418   HDL 41 05/18/2018 0418   CHOLHDL 2.7 05/18/2018 0418   VLDL 23 05/18/2018 0418   LDLCALC 47 05/18/2018 0418      Wt Readings from Last 3 Encounters:  06/04/18 198 lb 9.6 oz (90.1 kg)  05/26/18 202 lb 9.6 oz (91.9 kg)  10/05/17 205 lb (93 kg)      Other studies Reviewed: Echo 05/19/2018 LV EF: 60% - 65% Study Conclusions  - Left ventricle: The cavity size was normal. Wall thickness was increased in a pattern of moderate LVH. Systolic function was normal. The estimated ejection fraction was in the range of 60% to 65%. Possible basal inferior hypokinesis. Doppler parameters are consistent with abnormal left ventricular relaxation (grade 1 diastolic dysfunction). The E/e&' ratio is between 8-15, suggesting indeterminate LV filling pressure. - Mitral valve: Mildly thickened leaflets . There was mild regurgitation. - Left atrium: The atrium was mildly dilated. - Right atrium: The atrium was mildly dilated. - Inferior vena cava: The vessel was normal in size. The respirophasic diameter changes were in the normal range (= 50%), consistent with normal central venous pressure.  Impressions:  - Compared to a prior study in 2018, the LVEF is lower at  60-65% with moderate LVH and mild biatrial enlargment. There is basal inferior hypokinesis.   Cath 05/20/2018  Prox LAD to Mid LAD lesion is  100% stenosed.  Origin lesion is 100% stenosed.  Origin to Prox Graft lesion is 100% stenosed.  Origin to Prox Graft lesion is 100% stenosed.  Ramus lesion is 95% stenosed.  Prox Cx to Mid Cx lesion is 90% stenosed.  Prox LAD lesion is 80% stenosed.  Prox RCA lesion is 90% stenosed.  Mid RCA lesion is 99% stenosed.  Dist RCA lesion is 60% stenosed.   Cath 05/22/2018  Complex with orbital atherectomy followed by stenting of the proximal mid and distal RCA.  The mid and distal lesions were reduced from 99 and 70% stenosis to 0 and 0% stenosis using a 2.75 x 34 mm Onyx postdilated to 3.0 mm in diameter with TIMI grade III flow.  The proximal 85% RCA with TIMI grade III flow was reduced to 0% using a 22 x 3.5 mm Onyx postdilated to 3.75 mm in diameter with resultant TIMI grade III flow.  RECOMMENDATIONS:   Aspirin and Plavix dual antiplatelet therapy for at least 30 days and preferably for 3 months at which point Plavix monotherapy can be used to complete a 74-month duration of therapy.  Recommend uninterrupted dual antiplatelet therapy with Aspirin 81mg  daily and Clopidogrel 75mg  dailyfor a minimum of 6 months (stable ischemic heart disease - Class I recommendation).  ASSESSMENT AND PLAN:  1.  CAD: S/P cardiac cath in the setting of accelerated angina with high grade stenosis of the proximal and distal RCA with staged PCI with DES. She will stay on ASA until February 2020 ad then change to monotherapy on Plavix until April of 2020.   2. Hypertension: BP is elevated on first vital signs. Recheck in the exam room was lower at 140/78. Although not optimal will watch this a little longer before adjusting medications.   3. Hypercholesterolemia: Goal of LDL is < 70. Continue atorvastatin. She reports that her PCP manages this.    4. Type II Diabetes: Insulin dependent. Consider adding Januvia for cardioprotection.   5.Obesity: Weight loss and increased exercise for overall health and secondary prevention.   Current medicines are reviewed at length with the patient today.    Labs/ tests ordered today include:  None Phill Myron. West Pugh, ANP, [facility]   06/04/2018 10:27 AM    [facility] [address] Office [phone] Fax [phone]

## 2018-06-04 ENCOUNTER — Encounter: Payer: Self-pay | Admitting: Adult Health

## 2018-06-04 ENCOUNTER — Ambulatory Visit (INDEPENDENT_AMBULATORY_CARE_PROVIDER_SITE_OTHER): Payer: Medicare Other | Admitting: Adult Health

## 2018-06-04 VITALS — BP 158/64 | HR 73 | Ht 65.0 in | Wt 198.6 lb

## 2018-06-04 DIAGNOSIS — I10 Essential (primary) hypertension: Secondary | ICD-10-CM

## 2018-06-04 DIAGNOSIS — E7800 Pure hypercholesterolemia, unspecified: Secondary | ICD-10-CM

## 2018-06-04 DIAGNOSIS — I2510 Atherosclerotic heart disease of native coronary artery without angina pectoris: Secondary | ICD-10-CM | POA: Diagnosis not present

## 2018-06-04 DIAGNOSIS — I25119 Atherosclerotic heart disease of native coronary artery with unspecified angina pectoris: Secondary | ICD-10-CM | POA: Diagnosis not present

## 2018-06-04 NOTE — Patient Instructions (Signed)
Medication Instructions:  NO CHANGES- Your physician recommends that you continue on your current medications as directed. Please refer to the Current Medication list given to you today.  If you need a refill on your cardiac medications before your next appointment, please call your pharmacy.  Labwork: If you have labs (blood work) drawn today and your tests are completely normal, you will receive your results only by: Marland Kitchen MyChart Message (if you have MyChart) OR . A paper copy in the mail If you have any lab test that is abnormal or we need to change your treatment, we will call you to review the results.  Special Instructions: STOP ASPIRIN IN February  STOP PLAVIX IN APRIL  Follow-Up: You will need a follow up appointment in 3 MONTHS-IN [place] ONLY.  You may see  DR CMDOWELL,  Jory Sims, DNP, AACC or one of the following Advanced Practice Providers on your designated Care Team. At [facility], you and your health needs are our priority.  As part of our continuing mission to provide you with exceptional heart care, we have created designated Provider Care Teams.  These Care Teams include your primary Cardiologist (physician) and Advanced Practice Providers (APPs -  Physician Assistants and Nurse Practitioners) who all work together to provide you with the care you need, when you need it.

## 2018-07-18 ENCOUNTER — Other Ambulatory Visit (HOSPITAL_COMMUNITY): Payer: Self-pay | Admitting: Physician Assistant

## 2018-08-02 ENCOUNTER — Ambulatory Visit: Payer: Medicare Other | Admitting: Emergency Medicine

## 2018-08-05 ENCOUNTER — Other Ambulatory Visit: Payer: Self-pay | Admitting: Emergency Medicine

## 2018-08-08 DIAGNOSIS — M797 Fibromyalgia: Secondary | ICD-10-CM | POA: Insufficient documentation

## 2018-08-15 ENCOUNTER — Other Ambulatory Visit: Payer: Self-pay | Admitting: *Deleted

## 2018-08-15 MED ORDER — TIOTROPIUM BROMIDE-OLODATEROL 2.5-2.5 MCG/ACT IN AERS: 2.0000 | INHALATION_SPRAY | Freq: Every day | RESPIRATORY_TRACT | 0 refills | Status: DC

## 2018-08-16 ENCOUNTER — Encounter: Payer: Self-pay | Admitting: Emergency Medicine

## 2018-08-16 ENCOUNTER — Ambulatory Visit (INDEPENDENT_AMBULATORY_CARE_PROVIDER_SITE_OTHER): Payer: Medicare Other | Admitting: Emergency Medicine

## 2018-08-16 DIAGNOSIS — J4489 Other specified chronic obstructive pulmonary disease: Secondary | ICD-10-CM

## 2018-08-16 DIAGNOSIS — J449 Chronic obstructive pulmonary disease, unspecified: Secondary | ICD-10-CM

## 2018-08-16 NOTE — Patient Instructions (Signed)
We will continue Stiolto 2 puffs once daily. Keep your albuterol available use 2 puffs if needed for shortness of breath, chest tightness, wheezing. Please call our office if you have any flares of shortness of breath, increased cough, increased mucus production. Get the flu shot in the fall Your pneumonia shot is up-to-date. Follow with Dr. Lamonte Sakai in 1 year or sooner if you have any problems.

## 2018-08-16 NOTE — Progress Notes (Signed)
   Subjective:    Patient ID: Joyce [name], female    DOB: 23-May-1941, 78 y.o.   MRN: [id number]   Shortness of Breath  Pertinent negatives include no ear pain, fever, headaches, leg swelling, rash, rhinorrhea, sore throat, vomiting or wheezing.   ROV 08/16/2018 --Joyce [name] is a pleasant 78 year old woman who has a history of mixed restrictive and obstructive lung disease.  She underwent bilateral talc pleurodesis following persistent effusions following CABG in 09/2016.  We have been managing her on Stiolto.  She follows with cardiology, was admitted in November 2019 with chest discomfort, catheterization revealed patent LIMA to LAD but her vein grafts were occluded. She underwent stenting.  She reports today that she is feeling better although remains tired. She notes that she is benefiting from the [place] - she ran out last week. Has albuterol that she never needs it. No cough or wheeze. She has had the flu shot. Her PNA vaccine is up to date.    Review of Systems  Constitutional: Negative for fever and unexpected weight change.  HENT: Negative for congestion, dental problem, ear pain, nosebleeds, postnasal drip, rhinorrhea, sinus pressure, sneezing, sore throat and trouble swallowing.   Eyes: Negative for redness and itching.  Respiratory: Negative for cough, chest tightness, shortness of breath and wheezing.   Cardiovascular: Negative for palpitations and leg swelling.  Gastrointestinal: Negative for nausea and vomiting.  Genitourinary: Negative for dysuria.  Musculoskeletal: Negative for joint swelling.  Skin: Negative for rash.  Neurological: Negative for headaches.  Hematological: Does not bruise/bleed easily.  Psychiatric/Behavioral: Negative for dysphoric mood. The patient is not nervous/anxious.         Objective:   Physical Exam Vitals:   08/16/18 1332  BP: (!) 156/60  Pulse: 62  SpO2: 97%  Weight: 198 lb 6.4 oz (90 kg)  Height: 5\' 5"  (1.651 m)   Gen: Pleasant,  well-nourished, in no distress,  normal affect  ENT: No lesions,  mouth clear,  oropharynx clear, no postnasal drip  Neck: No JVD, no Stridor  Lungs: No use of accessory muscles, Good air movement, no wheezing or crackles, no basilar dullness  Cardiovascular: RRR, heart sounds normal, no murmur or gallops, no peripheral edema  Musculoskeletal: No deformities, no cyanosis or clubbing  Neuro: alert, non focal  Skin: Warm, no lesions or rashes      Assessment & Plan:  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  Stable at this time.  She has benefited from [employer] and we will continue.  We will continue Stiolto 2 puffs once daily. Keep your albuterol available use 2 puffs if needed for shortness of breath, chest tightness, wheezing. Please call our office if you have any flares of shortness of breath, increased cough, increased mucus production. Get the flu shot in the fall Your pneumonia shot is up-to-date. Follow with Dr. Lamonte Sakai in 1 year or sooner if you have any problems.  Baltazar Apo, MD, PhD 08/16/2018, 1:50 PM [facility] [phone] or if no answer [phone]

## 2018-08-16 NOTE — Assessment & Plan Note (Signed)
COPD probably on the basis of chronic fixed asthma.  Stable at this time.  She has benefited from [employer] and we will continue.  We will continue Stiolto 2 puffs once daily. Keep your albuterol available use 2 puffs if needed for shortness of breath, chest tightness, wheezing. Please call our office if you have any flares of shortness of breath, increased cough, increased mucus production. Get the flu shot in the fall Your pneumonia shot is up-to-date. Follow with Dr. Lamonte Sakai in 1 year or sooner if you have any problems.

## 2018-09-03 ENCOUNTER — Ambulatory Visit (INDEPENDENT_AMBULATORY_CARE_PROVIDER_SITE_OTHER): Payer: Medicare Other | Admitting: Cardiology

## 2018-09-03 ENCOUNTER — Encounter: Payer: Self-pay | Admitting: Cardiology

## 2018-09-03 VITALS — BP 160/62 | HR 62 | Ht 65.0 in | Wt 198.0 lb

## 2018-09-03 DIAGNOSIS — I25119 Atherosclerotic heart disease of native coronary artery with unspecified angina pectoris: Secondary | ICD-10-CM | POA: Diagnosis not present

## 2018-09-03 DIAGNOSIS — D696 Thrombocytopenia, unspecified: Secondary | ICD-10-CM

## 2018-09-03 DIAGNOSIS — I10 Essential (primary) hypertension: Secondary | ICD-10-CM

## 2018-09-03 DIAGNOSIS — E782 Mixed hyperlipidemia: Secondary | ICD-10-CM

## 2018-09-03 DIAGNOSIS — N183 Chronic kidney disease, stage 3 unspecified: Secondary | ICD-10-CM

## 2018-09-03 DIAGNOSIS — D649 Anemia, unspecified: Secondary | ICD-10-CM | POA: Diagnosis not present

## 2018-09-03 MED ORDER — ISOSORBIDE MONONITRATE ER 60 MG PO TB24: 60.0000 mg | ORAL_TABLET | Freq: Every day | ORAL | 3 refills | Status: DC

## 2018-09-03 NOTE — Patient Instructions (Addendum)
Medication Instructions:   Your physician has recommended you make the following change in your medication:   Increase imdur (isosorbide mononitrate) to 60 mg by mouth daily  Continue all other medications the same  Labwork:  NONE  Testing/Procedures:  NONE  Follow-Up:  Your physician recommends that you schedule a follow-up appointment in: 6 months. You will receive a reminder letter in the mail in about 4 months reminding you to call and schedule your appointment. If you don't receive this letter, please contact our office.  Any Other Special Instructions Will Be Listed Below (If Applicable).  You have been referred to a hematologist at [facility].  If you need a refill on your cardiac medications before your next appointment, please call your pharmacy.

## 2018-09-03 NOTE — Progress Notes (Signed)
Cardiology Office Note  Date: 09/03/2018   ID: Joyce [name], DOB [birth year], MRN [id number]  PCP: Allie Dimmer, MD  Primary Cardiologist: Rozann Lesches, MD   Chief Complaint  Patient presents with  . Coronary Artery Disease    History of Present Illness: Joyce [name] is a 78 y.o. female last seen by Ms. West Pugh in December 2019.  She is here for a follow-up visit.  She states that she has been enjoying cardiac rehabilitation, does feel fatigued at the end of her sessions and has had general lack of energy.  No obvious angina symptoms.  I reviewed her blood pressure recordings which have been up with exercise.  I reviewed her recent lab work from PCP as outlined below.  Her medications.  She is on multimodal therapy for treatment of hypertension in the setting of CKD stage III.  Last creatinine was 1.9.  She has been on low-dose lisinopril.  I also note that she is chronically anemic, MCV normal most recently.  She also has fluctuating platelet count with thrombocytopenia over time.  No reported history of GI bleeding or heme positive stools.  She has not undergone a formal hematology consultation.  I also wonder how much her anemia impacts her fatigue.  Past Medical History:  Diagnosis Date  . Anemia   . Arthritis   . Bilateral pleural effusion    Postoperative, status post Pleurx catheter and talc treatment - Dr. Prescott Gum  . 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 Chronic kidney disease (CKD), stage III (moderate) (HCC)   . Chronic lower back pain   . Essential hypertension   . Fibromyalgia   . History of pneumonia   . Hyperlipidemia   . Type II diabetes mellitus ([place])     Past Surgical History:  Procedure Laterality Date  . ABDOMINAL HYSTERECTOMY  1980's  . ANTERIOR CERVICAL DECOMP/DISCECTOMY FUSION  2000's  . APPENDECTOMY  1970's  . BACK SURGERY    . CHEST TUBE  INSERTION Left 08/07/2014   Procedure: INSERTION PLEURAL DRAINAGE CATHETER;  Surgeon: Ivin Poot, MD;  Location: [place];  Service: Thoracic;  Laterality: Left;  . CHEST TUBE INSERTION Right 08/12/2014   Procedure: INSERTION PLEURAL DRAINAGE CATHETER;  Surgeon: Ivin Poot, MD;  Location: [place];  Service: Thoracic;  Laterality: Right;  . CHOLECYSTECTOMY  ~ 2005  . CORONARY ARTERY BYPASS GRAFT N/A 06/15/2014   Procedure: CORONARY ARTERY BYPASS GRAFTING (CABG);  Surgeon: Ivin Poot, MD;  Location: [place];  Service: Open Heart Surgery;  Laterality: N/A;  . CORONARY ATHERECTOMY N/A 05/22/2018   Procedure: CORONARY ATHERECTOMY;  Surgeon: Belva Crome, MD;  Location: [facility] CV LAB;  Service: Cardiovascular;  Laterality: N/A;  . CORONARY STENT INTERVENTION N/A 05/22/2018   Procedure: CORONARY STENT INTERVENTION;  Surgeon: Belva Crome, MD;  Location: [place] CV LAB;  Service: Cardiovascular;  Laterality: N/A;  rca   . INTRAOPERATIVE TRANSESOPHAGEAL ECHOCARDIOGRAM N/A 06/15/2014   Procedure: INTRAOPERATIVE TRANSESOPHAGEAL ECHOCARDIOGRAM;  Surgeon: Ivin Poot, MD;  Location: [place];  Service: Open Heart Surgery;  Laterality: N/A;  . LEFT HEART CATH AND CORS/GRAFTS ANGIOGRAPHY N/A 05/20/2018   Procedure: LEFT HEART CATH AND CORS/GRAFTS ANGIOGRAPHY;  Surgeon: Lorretta Harp, MD;  Location: [place] CV LAB;  Service: Cardiovascular;  Laterality: N/A;  . LEFT HEART CATHETERIZATION WITH CORONARY ANGIOGRAM  N/A 06/11/2014   Procedure: LEFT HEART CATHETERIZATION WITH CORONARY ANGIOGRAM;  Surgeon: Blane Ohara, MD;  Location: [facility] CATH LAB;  Service: Cardiovascular;  Laterality: N/A;  . LUMBAR DISC SURGERY  1980's X 1; 1990's X  2000's X 1  . REMOVAL OF PLEURAL DRAINAGE CATHETER Bilateral 02/11/2015   Procedure: REMOVAL OF PLEURAL DRAINAGE CATHETER;  Surgeon: Ivin Poot, MD;  Location: [place];  Service: Thoracic;  Laterality: Bilateral;  . TALC PLEURODESIS Bilateral 02/11/2015     Procedure: Pietro Cassis;  Surgeon: Ivin Poot, MD;  Location: [place];  Service: Thoracic;  Laterality: Bilateral;  . TONSILLECTOMY  ~ 1950    Current Outpatient Medications  Medication Sig Dispense Refill  . ACCU-CHEK AVIVA PLUS test strip Inject 1 strip as directed 4 (four) times daily.  2  . acetaminophen (TYLENOL) 325 MG tablet Take 650 mg by mouth every 4 (four) hours as needed.    Marland Kitchen albuterol (PROVENTIL HFA;VENTOLIN HFA) 108 (90 Base) MCG/ACT inhaler Inhale 2 puffs into the lungs every 4 (four) hours as needed for wheezing or shortness of breath. 1 Inhaler 5  . amLODipine (NORVASC) 10 MG tablet Take 10 mg by mouth daily.   5  . atorvastatin (LIPITOR) 40 MG tablet Take 1 tablet (40 mg total) by mouth daily at 6 PM.    . B Complex-C-Folic Acid (DIALYVITE PO) Take 1 tablet by mouth daily.     . busPIRone (BUSPAR) 5 MG tablet Take 5 mg by mouth 3 (three) times daily.    . Cholecalciferol (VITAMIN D3) 2000 units TABS Take 2,000 Units by mouth daily.     . clopidogrel (PLAVIX) 75 MG tablet Take 1 tablet (75 mg total) by mouth daily with breakfast. 90 tablet 3  . ferrous sulfate 325 (65 FE) MG tablet TAKE ONE TABLET BY MOUTH DAILY 60 tablet 0  . fluticasone (FLONASE) 50 MCG/ACT nasal spray Place into both nostrils as directed.    . furosemide (LASIX) 20 MG tablet Take 20-40 mg by mouth See admin instructions. Take 1 tablet (40mg ) on Sun Tues Thurs Sat, then on all other days Mon Wed Fri take 1/2 tablet (20mg )    . GLUCERNA (GLUCERNA) LIQD Take 237 mLs by mouth daily.    . hydrALAZINE (APRESOLINE) 100 MG tablet Take 100 mg by mouth 3 (three) times daily.    . insulin glargine (LANTUS) 100 UNIT/ML injection Inject 40 Units into the skin at bedtime.     . insulin lispro (HUMALOG) 100 UNIT/ML KiwkPen Inject 8 Units into the skin 3 (three) times daily with meals.     . Insulin Syringe-Needle U-100 (INSULIN SYRINGE .5CC/30GX5/16") 30G X 5/16" 0.5 ML MISC Inject 0.5 mLs as directed daily.     . Insulin Syringe-Needle U-100 (INSULIN SYRINGE .5CC/30GX5/16") 30G X 5/16" 0.5 ML MISC Inject 0.5 mLs as directed 3 (three) times daily.  3  . lisinopril (PRINIVIL,ZESTRIL) 5 MG tablet Take 5 mg by mouth daily.     . nitroGLYCERIN (NITROSTAT) 0.4 MG SL tablet Place 1 tablet (0.4 mg total) under the tongue every 5 (five) minutes x 3 doses as needed for chest pain. 25 tablet 3  . polyethylene glycol (MIRALAX / GLYCOLAX) packet Take 17 g by mouth daily as needed for mild constipation. 14 each 2  . primidone (MYSOLINE) 50 MG tablet Take 50 mg by mouth daily.   5  . Tiotropium Bromide-Olodaterol (STIOLTO RESPIMAT) 2.5-2.5 MCG/ACT AERS Take 2 puffs by mouth daily. 1 Inhaler 0  .  traMADol (ULTRAM) 50 MG tablet Take 1 tablet (50 mg total) by mouth every 6 (six) hours as needed for moderate pain. (Patient taking differently: Take 50 mg by mouth every 4 (four) hours as needed for moderate pain or severe pain. ) 30 tablet 0  . vitamin C (ASCORBIC ACID) 500 MG tablet Take 500 mg by mouth daily.    . isosorbide mononitrate (IMDUR) 60 MG 24 hr tablet Take 1 tablet (60 mg total) by mouth daily. 90 tablet 3   No current facility-administered medications for this visit.    Allergies:  Carvedilol; Codeine; and Metoprolol   Social History: The patient  reports that she has never smoked. She has never used smokeless tobacco. She reports that she does not drink alcohol or use drugs.   ROS:  Please see the history of present illness. Otherwise, complete review of systems is positive for none.  All other systems are reviewed and negative.   Physical Exam: VS:  BP (!) 160/62   Pulse 62   Ht 5\' 5"  (1.651 m)   Wt 198 lb (89.8 kg)   SpO2 96%   BMI 32.95 kg/m , BMI Body mass index is 32.95 kg/m.  Wt Readings from Last 3 Encounters:  09/03/18 198 lb (89.8 kg)  08/16/18 198 lb 6.4 oz (90 kg)  06/04/18 198 lb 9.6 oz (90.1 kg)    General: Patient appears comfortable at rest. HEENT: Conjunctiva and lids normal,  oropharynx clear. Neck: Supple, no elevated JVP or carotid bruits, no thyromegaly. Lungs: Clear to auscultation, nonlabored breathing at rest. Cardiac: Regular rate and rhythm, no S3, 2/6 systolic murmur. Abdomen: Soft, nontender, bowel sounds present. Extremities: No pitting edema, distal pulses 2+. Skin: Warm and dry. Musculoskeletal: No kyphosis. Neuropsychiatric: Alert and oriented x3, affect grossly appropriate.  ECG: I personally reviewed the tracing from 06/04/2018 which showed normal sinus rhythm with PACs.  Recent Labwork: 05/29/2018: BUN 36; Creatinine, Ser 1.76; Hemoglobin 9.7; Platelets 169; Potassium 5.1; Sodium 138     Component Value Date/Time   CHOL 111 05/18/2018 0418   TRIG 116 05/18/2018 0418   HDL 41 05/18/2018 0418   CHOLHDL 2.7 05/18/2018 0418   VLDL 23 05/18/2018 0418   LDLCALC 47 05/18/2018 0418  February 2020: Hemoglobin 9.9, platelets 129, potassium 4.6, BUN 44, creatinine 1.91, hemoglobin A1c 5.2%, AST 15, ALT 11, cholesterol 118, triglycerides 79, HDL 49, LDL 53  Other Studies Reviewed Today:  Echocardiogram 05/19/2018: Study Conclusions  - Left ventricle: The cavity size was normal. Wall thickness was   increased in a pattern of moderate LVH. Systolic function was   normal. The estimated ejection fraction was in the range of 60%   to 65%. Possible basal inferior hypokinesis. Doppler parameters   are consistent with abnormal left ventricular relaxation (grade 1   diastolic dysfunction). The E/e&' ratio is between 8-15,   suggesting indeterminate LV filling pressure. - Mitral valve: Mildly thickened leaflets . There was mild   regurgitation. - Left atrium: The atrium was mildly dilated. - Right atrium: The atrium was mildly dilated. - Inferior vena cava: The vessel was normal in size. The   respirophasic diameter changes were in the normal range (= 50%),   consistent with normal central venous pressure.  Impressions:  - Compared to a prior  study in 2018, the LVEF is lower at 60-65%   with moderate LVH and mild biatrial enlargment. There is basal   inferior hypokinesis.  Cardiac catheterization 05/20/2018:  Prox LAD to Mid LAD  lesion is 100% stenosed.  Origin lesion is 100% stenosed.  Origin to Prox Graft lesion is 100% stenosed.  Origin to Prox Graft lesion is 100% stenosed.  Ramus lesion is 95% stenosed.  Prox Cx to Mid Cx lesion is 90% stenosed.  Prox LAD lesion is 80% stenosed.  Prox RCA lesion is 90% stenosed.  Mid RCA lesion is 99% stenosed.  Dist RCA lesion is 60% stenosed.  IMPRESSION: Joyce [name] is now 4 years post coronary artery bypass grafting x4.  All of her vein grafts are occluded at the aorta.  LIMA is patent to LAD.  She has high-grade calcified proximal mid and distal dominant RCA stenosis along with a high-grade mid ramus branch stenosis just proximal to the occluded vein graft.  I used 100 cc of contrast and and decided to stage her intervention given her chronic renal insufficiency.  I performed a femoral angiogram and Mynx closed her right common femoral puncture site.  I did give her 40 mg of IV Lasix because of an elevated LVEDP of 19 as well as hydralazine IV for retention.  She left the lab in stable condition.Marland Kitchen  PCI 05/22/2018:  Complex with orbital atherectomy followed by stenting of the proximal mid and distal RCA.  The mid and distal lesions were reduced from 99 and 70% stenosis to 0 and 0% stenosis using a 2.75 x 34 mm Onyx postdilated to 3.0 mm in diameter with TIMI grade III flow.  The proximal 85% RCA with TIMI grade III flow was reduced to 0% using a 22 x 3.5 mm Onyx postdilated to 3.75 mm in diameter with resultant TIMI grade III flow.  RECOMMENDATIONS:   Aspirin and Plavix dual antiplatelet therapy for at least 30 days and preferably for 3 months at which point Plavix monotherapy can be used to complete a 67-month duration of therapy.  Assessment and Plan:  1.  Status  post CABG in 2015 with subsequently documented vein graft failure.  She underwent atherectomy and stent intervention to the mid to distal RCA in November 2019 as outlined above.  No clear-cut angina at this time but lack of energy and fatigue.  I reviewed her current cardiac regimen listed above.  We will increase Imdur to 60 mg a day which may further help with blood pressure, somewhat reluctant to push lisinopril higher due to her renal insufficiency, but this may eventually be a consideration.  2.  Chronic anemia and also recurring thrombocytopenia.  She has not undergone prior hematology consultation this will be arranged.  She is on iron supplements at this time.  No reported history of GI bleeding or change in stools.  3.  Essential hypertension, on hydralazine, lisinopril, Norvasc and Imdur.  4.  Mixed hyperlipidemia, on Lipitor.  LDL was 53 by recent follow-up lab work per PCP.  5.  CKD stage III, creatinine 1.9.  Current medicines were reviewed with the patient today.   Orders Placed This Encounter  Procedures  . Ambulatory referral to Hematology    Disposition: Follow-up in 6 months.  Signed, Satira Sark, MD, [facility] 09/03/2018 10:19 AM    [facility] [facility] [place], [place], [place] [address] Phone: [phone]; Fax: [phone]

## 2018-09-12 ENCOUNTER — Ambulatory Visit (HOSPITAL_COMMUNITY): Payer: Medicare Other | Admitting: Hematology

## 2018-09-24 ENCOUNTER — Other Ambulatory Visit: Payer: Self-pay | Admitting: Physician Assistant

## 2018-10-03 ENCOUNTER — Ambulatory Visit (HOSPITAL_COMMUNITY): Payer: Medicare Other | Admitting: Hematology

## 2018-11-21 ENCOUNTER — Ambulatory Visit (HOSPITAL_COMMUNITY): Payer: Medicare Other | Admitting: Hematology

## 2018-12-30 ENCOUNTER — Telehealth: Payer: Self-pay | Admitting: Emergency Medicine

## 2018-12-30 MED ORDER — ALBUTEROL SULFATE HFA 108 (90 BASE) MCG/ACT IN AERS: 2.0000 | INHALATION_SPRAY | RESPIRATORY_TRACT | 5 refills | Status: DC | PRN

## 2018-12-30 NOTE — Telephone Encounter (Signed)
Refill of albuterol inhaler has been sent to pt's preferred pharmacy. Called and spoke with pt letting her know this had been done and pt verbalized understanding. Nothing further needed.

## 2019-01-07 ENCOUNTER — Ambulatory Visit (INDEPENDENT_AMBULATORY_CARE_PROVIDER_SITE_OTHER): Payer: Medicare Other | Admitting: Emergency Medicine

## 2019-01-07 ENCOUNTER — Other Ambulatory Visit: Payer: Self-pay

## 2019-01-07 ENCOUNTER — Encounter: Payer: Self-pay | Admitting: Emergency Medicine

## 2019-01-07 DIAGNOSIS — J449 Chronic obstructive pulmonary disease, unspecified: Secondary | ICD-10-CM

## 2019-01-07 DIAGNOSIS — I25119 Atherosclerotic heart disease of native coronary artery with unspecified angina pectoris: Secondary | ICD-10-CM

## 2019-01-07 MED ORDER — STIOLTO RESPIMAT 2.5-2.5 MCG/ACT IN AERS: 2.0000 | INHALATION_SPRAY | Freq: Every day | RESPIRATORY_TRACT | 11 refills | Status: DC

## 2019-01-07 NOTE — Assessment & Plan Note (Signed)
We will continue Stiolto 2 puffs once daily.  We will refill this today. Continue albuterol 2 puffs if needed for shortness of breath, chest tightness, wheezing. The flu shot this fall Pneumonia shot is up-to-date Follow with Dr. Lamonte Sakai in 12 months or sooner if you have any problems

## 2019-01-07 NOTE — Progress Notes (Signed)
   Subjective:    Patient ID: Joyce [name], female    DOB: [birth year], 78 y.o.   MRN: [id number]   Shortness of Breath Pertinent negatives include no ear pain, fever, headaches, leg swelling, rash, rhinorrhea, sore throat, vomiting or wheezing.   ROV 08/16/2018 --Joyce [name] is a pleasant 78 year old woman who has a history of mixed restrictive and obstructive lung disease.  She underwent bilateral talc pleurodesis following persistent effusions following CABG in 09/2016.  We have been managing her on Stiolto.  She follows with cardiology, was admitted in November 2019 with chest discomfort, catheterization revealed patent LIMA to LAD but her vein grafts were occluded. She underwent stenting.  She reports today that she is feeling better although remains tired. She notes that she is benefiting from the [place] - she ran out last week. Has albuterol that she never needs it. No cough or wheeze. She has had the flu shot. Her PNA vaccine is up to date.   ROV 01/07/2019 --78 year old woman with a history of coronary disease/CABG, talc pleurodesis bilaterally for persistent associated effusions, COPD / fixed asthma which has been managed on Stiolto. She has albuterol, uses rarely when she exerts. No flares since last time - no abx or pred. She has occasional fatigue, has been noted to have anemia, planning to see Heme.  Exercise tolerance is moderate. She doesn't do much in the heat. She can do housework. She is able to shop for groceries.    Review of Systems  Constitutional: Negative for fever and unexpected weight change.  HENT: Negative for congestion, dental problem, ear pain, nosebleeds, postnasal drip, rhinorrhea, sinus pressure, sneezing, sore throat and trouble swallowing.   Eyes: Negative for redness and itching.  Respiratory: Negative for cough, chest tightness, shortness of breath and wheezing.   Cardiovascular: Negative for palpitations and leg swelling.  Gastrointestinal: Negative for  nausea and vomiting.  Genitourinary: Negative for dysuria.  Musculoskeletal: Negative for joint swelling.  Skin: Negative for rash.  Neurological: Negative for headaches.  Hematological: Does not bruise/bleed easily.  Psychiatric/Behavioral: Negative for dysphoric mood. The patient is not nervous/anxious.         Objective:   Physical Exam Vitals:   01/07/19 1540  BP: 110/60  Pulse: 84  SpO2: 96%  Weight: 88.5 kg  Height: 5\' 5"  (1.651 m)   Gen: Pleasant, well-nourished, in no distress,  normal affect  ENT: No lesions,  mouth clear,  oropharynx clear, no postnasal drip  Neck: No JVD, no Stridor  Lungs: No use of accessory muscles, Good air movement, no wheezing or crackles, no basilar dullness  Cardiovascular: RRR, 3/6 syst M w intact S2  Musculoskeletal: No deformities, no cyanosis or clubbing  Neuro: alert, non focal  Skin: Warm, no lesions or rashes      Assessment & Plan:  COPD with asthma (HCC) We will continue Stiolto 2 puffs once daily.  We will refill this today. Continue albuterol 2 puffs if needed for shortness of breath, chest tightness, wheezing. The flu shot this fall Pneumonia shot is up-to-date Follow with Dr. Lamonte Sakai in 12 months or sooner if you have any problems  Baltazar Apo, MD, PhD 01/10/2019, 3:24 PM [facility] [phone] or if no answer [phone]

## 2019-01-07 NOTE — Patient Instructions (Addendum)
We will continue Stiolto 2 puffs once daily.  We will refill this today. Continue albuterol 2 puffs if needed for shortness of breath, chest tightness, wheezing. The flu shot this fall Pneumonia shot is up-to-date Follow with Dr. Lamonte Sakai in 12 months or sooner if you have any problems.

## 2019-01-30 ENCOUNTER — Encounter (HOSPITAL_COMMUNITY): Payer: Self-pay | Admitting: Surgery

## 2019-01-30 ENCOUNTER — Encounter (HOSPITAL_COMMUNITY): Payer: Self-pay | Admitting: Hematology

## 2019-01-30 ENCOUNTER — Other Ambulatory Visit: Payer: Self-pay

## 2019-01-30 ENCOUNTER — Inpatient Hospital Stay (HOSPITAL_COMMUNITY): Payer: Medicare Other | Attending: Hematology | Admitting: Hematology

## 2019-01-30 ENCOUNTER — Inpatient Hospital Stay (HOSPITAL_COMMUNITY): Payer: Medicare Other

## 2019-01-30 DIAGNOSIS — N183 Chronic kidney disease, stage 3 (moderate): Secondary | ICD-10-CM | POA: Diagnosis not present

## 2019-01-30 DIAGNOSIS — Z79899 Other long term (current) drug therapy: Secondary | ICD-10-CM | POA: Insufficient documentation

## 2019-01-30 DIAGNOSIS — E119 Type 2 diabetes mellitus without complications: Secondary | ICD-10-CM

## 2019-01-30 DIAGNOSIS — Z794 Long term (current) use of insulin: Secondary | ICD-10-CM | POA: Diagnosis not present

## 2019-01-30 DIAGNOSIS — I252 Old myocardial infarction: Secondary | ICD-10-CM | POA: Insufficient documentation

## 2019-01-30 DIAGNOSIS — D649 Anemia, unspecified: Secondary | ICD-10-CM | POA: Diagnosis present

## 2019-01-30 DIAGNOSIS — I10 Essential (primary) hypertension: Secondary | ICD-10-CM | POA: Insufficient documentation

## 2019-01-30 LAB — CBC WITH DIFFERENTIAL/PLATELET
Abs Immature Granulocytes: 0.03 10*3/uL (ref 0.00–0.07)
Basophils Absolute: 0 10*3/uL (ref 0.0–0.1)
Basophils Relative: 0 %
Eosinophils Absolute: 0.1 10*3/uL (ref 0.0–0.5)
Eosinophils Relative: 1 %
HCT: 31.7 % — ABNORMAL LOW (ref 36.0–46.0)
Hemoglobin: 10.2 g/dL — ABNORMAL LOW (ref 12.0–15.0)
Immature Granulocytes: 1 %
Lymphocytes Relative: 17 %
Lymphs Abs: 1 10*3/uL (ref 0.7–4.0)
MCH: 29.1 pg (ref 26.0–34.0)
MCHC: 32.2 g/dL (ref 30.0–36.0)
MCV: 90.3 fL (ref 80.0–100.0)
Monocytes Absolute: 0.4 10*3/uL (ref 0.1–1.0)
Monocytes Relative: 7 %
Neutro Abs: 4.7 10*3/uL (ref 1.7–7.7)
Neutrophils Relative %: 74 %
Platelets: 148 10*3/uL — ABNORMAL LOW (ref 150–400)
RBC: 3.51 MIL/uL — ABNORMAL LOW (ref 3.87–5.11)
RDW: 13 % (ref 11.5–15.5)
WBC: 6.3 10*3/uL (ref 4.0–10.5)
nRBC: 0 % (ref 0.0–0.2)

## 2019-01-30 LAB — COMPREHENSIVE METABOLIC PANEL
ALT: 15 U/L (ref 0–44)
AST: 16 U/L (ref 15–41)
Albumin: 4.1 g/dL (ref 3.5–5.0)
Alkaline Phosphatase: 54 U/L (ref 38–126)
Anion gap: 4 — ABNORMAL LOW (ref 5–15)
BUN: 37 mg/dL — ABNORMAL HIGH (ref 8–23)
CO2: 27 mmol/L (ref 22–32)
Calcium: 8.7 mg/dL — ABNORMAL LOW (ref 8.9–10.3)
Chloride: 103 mmol/L (ref 98–111)
Creatinine, Ser: 1.86 mg/dL — ABNORMAL HIGH (ref 0.44–1.00)
GFR calc Af Amer: 30 mL/min — ABNORMAL LOW (ref >60)
GFR calc non Af Amer: 26 mL/min — ABNORMAL LOW (ref >60)
Glucose, Bld: 78 mg/dL (ref 70–99)
Potassium: 4.6 mmol/L (ref 3.5–5.1)
Sodium: 134 mmol/L — ABNORMAL LOW (ref 135–145)
Total Bilirubin: 0.5 mg/dL (ref 0.3–1.2)
Total Protein: 6.5 g/dL (ref 6.5–8.1)

## 2019-01-30 LAB — FOLATE: Folate: 6.4 ng/mL (ref >5.9)

## 2019-01-30 LAB — RETICULOCYTES
Immature Retic Fract: 8.1 % (ref 2.3–15.9)
RBC.: 3.51 MIL/uL — ABNORMAL LOW (ref 3.87–5.11)
Retic Count, Absolute: 94.4 10*3/uL (ref 19.0–186.0)
Retic Ct Pct: 2.7 % (ref 0.4–3.1)

## 2019-01-30 LAB — TSH: TSH: 4.584 u[IU]/mL — ABNORMAL HIGH (ref 0.350–4.500)

## 2019-01-30 LAB — LACTATE DEHYDROGENASE: LDH: 133 U/L (ref 98–192)

## 2019-01-30 LAB — IRON AND TIBC
Iron: 65 ug/dL (ref 28–170)
Saturation Ratios: 24 % (ref 10.4–31.8)
TIBC: 272 ug/dL (ref 250–450)
UIBC: 207 ug/dL

## 2019-01-30 LAB — FERRITIN: Ferritin: 93 ng/mL (ref 11–307)

## 2019-01-30 LAB — VITAMIN B12: Vitamin B-12: 588 pg/mL (ref 180–914)

## 2019-01-30 NOTE — Patient Instructions (Addendum)
[facility] at [facility] Discharge Instructions  You were seen today by Dr. Katragadda. He went over your history, family history and how you've been feeling lately. He will have blood work drawn today. He will see you back in 3 weeks for follow up.   Thank you for choosing [facility] at [facility] to provide your oncology and hematology care.  To afford each patient quality time with our provider, please arrive at least 15 minutes before your scheduled appointment time.   If you have a lab appointment with the Cancer Center please come in thru the  Main Entrance and check in at the main information desk  You need to re-schedule your appointment should you arrive 10 or more minutes late.  We strive to give you quality time with our providers, and arriving late affects you and other patients whose appointments are after yours.  Also, if you no show three or more times for appointments you may be dismissed from the clinic at the providers discretion.     Again, thank you for choosing [facility].  Our hope is that these requests will decrease the amount of time that you wait before being seen by our physicians.       _____________________________________________________________  Should you have questions after your visit to [facility], please contact our office at [phone] between the hours of 8:00 a.m. and 4:30 p.m.  Voicemails left after 4:00 p.m. will not be returned until the following business day.  For prescription refill requests, have your pharmacy contact our office and allow 72 hours.    Cancer Center Support Programs:   > Cancer Support Group  2nd Tuesday of the month 1pm-2pm, Journey Room    

## 2019-01-30 NOTE — Progress Notes (Signed)
CONSULT NOTE  Patient Care Team: Allie Dimmer, MD as PCP - General (Internal Medicine) Satira Sark, MD as PCP - Cardiology (Cardiology)  CHIEF COMPLAINTS/PURPOSE OF CONSULTATION:  Normocytic anemia.  HISTORY OF PRESENTING ILLNESS:  Joyce [name] 78 y.o. female is seen in consultation today at the request of Dr. Domenic Polite for further work-up and management of normocytic anemia.  Most recent CBC on 11/15/2018 shows hemoglobin of 10.3 with MCV of 87.  She reportedly had 2 units of blood transfusion when she had an MI in 2019 November.  She denies any bleeding per rectum or melena.  She is taking iron tablet daily since November 2019.  She has some constipation managed with prune juice.  Denies any chest pain on exertion or shortness of breath on minimal exertion.  Denies any presyncopal episodes or palpitations.  No epistaxis, or hematuria.  Denies any over-the-counter NSAID ingestion.  She is currently on Plavix 75 mg daily.  Her last colonoscopy was in 2014, done in [place] by Dr. Matt Holmes.  She denies any pica.  No prior history of malignancy.  She is seen today with her daughter.  She lives at home by herself and is able to do all IADLs and ADLs.  She used to work in a Theatre manager and and eating well.  Never smoker.  Family history significant for sister with Hodgkin's disease.  Appetite is reported as 100% and energy levels are low.   MEDICAL HISTORY:  Past Medical History:  Diagnosis Date  . Anemia   . Arthritis   . Bilateral pleural effusion    Postoperative, status post Pleurx catheter and talc treatment - Dr. Prescott Gum  . 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 Chronic kidney disease (CKD), stage III (moderate) (HCC)   . Chronic lower back pain   . Essential hypertension   . History of pneumonia   . Hyperlipidemia   . Myocardial infarction ([place])   . Neuromuscular disorder  ([place])   . Type II diabetes mellitus ([place])     SURGICAL HISTORY: Past Surgical History:  Procedure Laterality Date  . ABDOMINAL HYSTERECTOMY  1980's  . ANTERIOR CERVICAL DECOMP/DISCECTOMY FUSION  2000's  . APPENDECTOMY  1970's  . BACK SURGERY    . CHEST TUBE INSERTION Left 08/07/2014   Procedure: INSERTION PLEURAL DRAINAGE CATHETER;  Surgeon: Ivin Poot, MD;  Location: [place];  Service: Thoracic;  Laterality: Left;  . CHEST TUBE INSERTION Right 08/12/2014   Procedure: INSERTION PLEURAL DRAINAGE CATHETER;  Surgeon: Ivin Poot, MD;  Location: [place];  Service: Thoracic;  Laterality: Right;  . CHOLECYSTECTOMY  ~ 2005  . CORONARY ARTERY BYPASS GRAFT N/A 06/15/2014   Procedure: CORONARY ARTERY BYPASS GRAFTING (CABG);  Surgeon: Ivin Poot, MD;  Location: [place];  Service: Open Heart Surgery;  Laterality: N/A;  . CORONARY ATHERECTOMY N/A 05/22/2018   Procedure: CORONARY ATHERECTOMY;  Surgeon: Belva Crome, MD;  Location: [place] [facility];  Service: Cardiovascular;  Laterality: N/A;  . CORONARY STENT INTERVENTION N/A 05/22/2018   Procedure: CORONARY STENT INTERVENTION;  Surgeon: Belva Crome, MD;  Location: [place] [facility];  Service: Cardiovascular;  Laterality: N/A;  rca   . INTRAOPERATIVE TRANSESOPHAGEAL ECHOCARDIOGRAM N/A 06/15/2014   Procedure: INTRAOPERATIVE TRANSESOPHAGEAL ECHOCARDIOGRAM;  Surgeon: Ivin Poot, MD;  Location: [place];  Service: Open Heart Surgery;  Laterality: N/A;  .  LEFT HEART CATH AND CORS/GRAFTS ANGIOGRAPHY N/A 05/20/2018   Procedure: LEFT HEART CATH AND CORS/GRAFTS ANGIOGRAPHY;  Surgeon: Lorretta Harp, MD;  Location: [place] [facility];  Service: Cardiovascular;  Laterality: N/A;  . LEFT HEART CATHETERIZATION WITH CORONARY ANGIOGRAM N/A 06/11/2014   Procedure: LEFT HEART CATHETERIZATION WITH CORONARY ANGIOGRAM;  Surgeon: Blane Ohara, MD;  Location: [facility];  Service: Cardiovascular;  Laterality: N/A;  . LUMBAR DISC SURGERY  1980's X 1;  1990's X  2000's X 1  . REMOVAL OF PLEURAL DRAINAGE CATHETER Bilateral 02/11/2015   Procedure: REMOVAL OF PLEURAL DRAINAGE CATHETER;  Surgeon: Ivin Poot, MD;  Location: [place];  Service: Thoracic;  Laterality: Bilateral;  . TALC PLEURODESIS Bilateral 02/11/2015   Procedure: Pietro Cassis;  Surgeon: Ivin Poot, MD;  Location: [place];  Service: Thoracic;  Laterality: Bilateral;  . TONSILLECTOMY  ~ 1950    SOCIAL HISTORY: Social History   Socioeconomic History  . Marital status: Widowed    Spouse name: Not on file  . Number of children: 2  . Years of education: Not on file  . Highest education level: Not on file  Occupational History  . Occupation: retired  Scientific laboratory technician  . Financial resource strain: Not on file  . Food insecurity    Worry: Not on file    Inability: Not on file  . Transportation needs    Medical: Not on file    Non-medical: Not on file  Tobacco Use  . Smoking status: Never Smoker  . Smokeless tobacco: Never Used  Substance and Sexual Activity  . Alcohol use: No    Alcohol/week: 0.0 standard drinks  . Drug use: No  . Sexual activity: Not on file  Lifestyle  . Physical activity    Days per week: Not on file    Minutes per session: Not on file  . Stress: Not on file  Relationships  . Social Herbalist on phone: Not on file    Gets together: Not on file    Attends religious service: Not on file    Active member of club or organization: Not on file    Attends meetings of clubs or organizations: Not on file    Relationship status: Not on file  . Intimate partner violence    Fear of current or ex partner: Not on file    Emotionally abused: Not on file    Physically abused: Not on file    Forced sexual activity: Not on file  Other Topics Concern  . Not on file  Social History Narrative  . Not on file    FAMILY HISTORY: Family History  Problem Relation Age of Onset  . Stroke Father   . Diabetes Mother   . Hypertension Mother   .  Alcoholism Brother   . Gout Brother   . Arthritis Brother   . Alcoholism Brother   . Heart disease Sister   . Heart attack Sister   . Cancer Sister        hodkins lymphoma  . CAD Paternal Grandmother        heart attack  . CAD Paternal Uncle        heart attack  . Heart attack Paternal Grandfather   . Osteoporosis Sister     ALLERGIES:  is allergic to carvedilol; codeine; and metoprolol.  MEDICATIONS:  Current Outpatient Medications  Medication Sig Dispense Refill  . ACCU-CHEK AVIVA PLUS test strip Inject 1 strip as directed  4 (four) times daily.  2  . acetaminophen (TYLENOL) 325 MG tablet Take 650 mg by mouth every 4 (four) hours as needed.    Marland Kitchen albuterol (VENTOLIN HFA) 108 (90 Base) MCG/ACT inhaler Inhale 2 puffs into the lungs every 4 (four) hours as needed for wheezing or shortness of breath. 8 g 5  . amLODipine (NORVASC) 10 MG tablet Take 10 mg by mouth daily.   5  . atorvastatin (LIPITOR) 40 MG tablet Take 1 tablet (40 mg total) by mouth daily at 6 PM.    . busPIRone (BUSPAR) 5 MG tablet Take 5 mg by mouth as needed. Pt is supposed to take TID but takes prn    . Cholecalciferol (VITAMIN D3) 2000 units TABS Take 2,000 Units by mouth daily.     . clopidogrel (PLAVIX) 75 MG tablet Take 1 tablet (75 mg total) by mouth daily with breakfast. 90 tablet 3  . ferrous sulfate 325 (65 FE) MG tablet TAKE ONE TABLET BY MOUTH DAILY 60 tablet 6  . fluticasone (FLONASE) 50 MCG/ACT nasal spray Place 1 spray into both nostrils daily.     . furosemide (LASIX) 40 MG tablet Take 40 mg by mouth daily.    Marland Kitchen GLUCERNA (GLUCERNA) LIQD Take 237 mLs by mouth daily.    . hydrALAZINE (APRESOLINE) 100 MG tablet Take 100 mg by mouth 3 (three) times daily.    . insulin glargine (LANTUS) 100 UNIT/ML injection Inject 40 Units into the skin at bedtime.     . insulin lispro (HUMALOG) 100 UNIT/ML KiwkPen Inject 8 Units into the skin 3 (three) times daily with meals.     . Insulin Syringe-Needle U-100 (INSULIN  SYRINGE .5CC/30GX5/16") 30G X 5/16" 0.5 ML MISC Inject 0.5 mLs as directed 3 (three) times daily.  3  . isosorbide mononitrate (IMDUR) 60 MG 24 hr tablet Take 1 tablet (60 mg total) by mouth daily. 90 tablet 3  . lisinopril (PRINIVIL,ZESTRIL) 5 MG tablet Take 5 mg by mouth daily.     . nitroGLYCERIN (NITROSTAT) 0.4 MG SL tablet Place 1 tablet (0.4 mg total) under the tongue every 5 (five) minutes x 3 doses as needed for chest pain. 25 tablet 3  . polyethylene glycol (MIRALAX / GLYCOLAX) packet Take 17 g by mouth daily as needed for mild constipation. 14 each 2  . primidone (MYSOLINE) 50 MG tablet Take 50 mg by mouth daily.   5  . Tiotropium Bromide-Olodaterol (STIOLTO RESPIMAT) 2.5-2.5 MCG/ACT AERS Take 2 puffs by mouth daily. 4 g 11  . traMADol (ULTRAM) 50 MG tablet Take 1 tablet (50 mg total) by mouth every 6 (six) hours as needed for moderate pain. (Patient taking differently: Take 50 mg by mouth every 4 (four) hours as needed for moderate pain or severe pain. ) 30 tablet 0  . vitamin C (ASCORBIC ACID) 500 MG tablet Take 500 mg by mouth daily.     No current facility-administered medications for this visit.     REVIEW OF SYSTEMS:   Constitutional: Denies fevers, chills or abnormal night sweats.  Positive for fatigue. Eyes: Denies blurriness of vision, double vision or watery eyes Ears, nose, mouth, throat, and face: Denies mucositis or sore throat Respiratory: Denies cough, dyspnea or wheezes Cardiovascular: Denies palpitation, chest discomfort or lower extremity swelling Gastrointestinal: Denies any nausea or vomiting.  Positive for constipation. Skin: Denies abnormal skin rashes Lymphatics: Denies new lymphadenopathy or easy bruising Neurological:Denies numbness, tingling or new weaknesses Behavioral/Psych: Mood is stable, no new changes  All other systems were reviewed with the patient and are negative.  PHYSICAL EXAMINATION: ECOG PERFORMANCE STATUS: 1 - Symptomatic but completely  ambulatory  Vitals:   01/30/19 1323  BP: (!) 154/50  Pulse: 67  Resp: 18  Temp: 97.8 F (36.6 C)  SpO2: 99%   Filed Weights   01/30/19 1323  Weight: 198 lb 9.6 oz (90.1 kg)    GENERAL:alert, no distress and comfortable SKIN: skin color, texture, turgor are normal, no rashes or significant lesions EYES: normal, conjunctiva are pink and non-injected, sclera clear OROPHARYNX:no exudate, no erythema and lips, buccal mucosa, and tongue normal  NECK: supple, thyroid normal size, non-tender, without nodularity LYMPH:  no palpable lymphadenopathy in the cervical, axillary or inguinal LUNGS: clear to auscultation and percussion with normal breathing effort HEART: regular rate & rhythm and no murmurs and no lower extremity edema ABDOMEN: Soft, nontender.  Midline hernia present. Musculoskeletal:no cyanosis of digits and no clubbing  PSYCH: alert & oriented x 3 with fluent speech NEURO: no focal motor/sensory deficits  LABORATORY DATA:  I have reviewed the data as listed  RADIOGRAPHIC STUDIES: I have personally reviewed the radiological images as listed and agreed with the findings in the report.  ASSESSMENT & PLAN:  Normocytic anemia 1.  Normocytic anemia: -Normocytic anemia since 2015.  Patient with history of chronic kidney disease since 2012. - Most recent CBC on 11/15/2018 with a hemoglobin of 10.3 and MCV of 87.  White count and platelet count were normal. -Patient has been taking iron tablet daily since November 2019.  She does report mild constipation and takes prune juice. -She denies any easy bruising or bleeding.  However she is on Plavix 75 mg daily. -Last colonoscopy 6 years ago in [place] by Dr. Matt Holmes, apparently normal. -Last blood transfusion 2 units of PRBC in #2019 when patient had MI. - Normocytic anemia likely from combination of chronic kidney disease and relative iron deficiency. -We will check CBC today, reticulocyte count, LDH and haptoglobin.  Will check  for nutritional deficiencies including ferritin, iron panel, U03, folic acid, methylmalonic acid and copper levels.  We will also check TSH level.  We will check stool for occult blood. -If iron levels are low, will consider parenteral iron therapy. -We will see her back in 2 to 3 weeks to discuss results.     All questions were answered. The patient knows to call the clinic with any problems, questions or concerns.      Derek Jack, MD 01/30/19 2:24 PM

## 2019-01-30 NOTE — Assessment & Plan Note (Addendum)
1.  Normocytic anemia: -Normocytic anemia since 2015.  Patient with history of chronic kidney disease since 2012. - Most recent CBC on 11/15/2018 with a hemoglobin of 10.3 and MCV of 87.  White count and platelet count were normal. -Patient has been taking iron tablet daily since November 2019.  She does report mild constipation and takes prune juice. -She denies any easy bruising or bleeding.  However she is on Plavix 75 mg daily. -Last colonoscopy 6 years ago in [place] by Dr. Matt Holmes, apparently normal. -Last blood transfusion 2 units of PRBC in #2019 when patient had MI. - Normocytic anemia likely from combination of chronic kidney disease and relative iron deficiency. -We will check CBC today, reticulocyte count, LDH and haptoglobin.  Will check for nutritional deficiencies including ferritin, iron panel, I31, folic acid, methylmalonic acid and copper levels.  We will also check TSH level.  We will check stool for occult blood. -If iron levels are low, will consider parenteral iron therapy. -We will see her back in 2 to 3 weeks to discuss results.

## 2019-01-31 LAB — HAPTOGLOBIN: Haptoglobin: 149 mg/dL (ref 42–346)

## 2019-02-01 LAB — METHYLMALONIC ACID, SERUM: Methylmalonic Acid, Quantitative: 256 nmol/L (ref 0–378)

## 2019-02-01 LAB — COPPER, SERUM: Copper: 99 ug/dL (ref 72–166)

## 2019-02-03 LAB — PROTEIN ELECTROPHORESIS, SERUM
A/G Ratio: 1.7 (ref 0.7–1.7)
Albumin ELP: 3.8 g/dL (ref 2.9–4.4)
Alpha-1-Globulin: 0.2 g/dL (ref 0.0–0.4)
Alpha-2-Globulin: 0.8 g/dL (ref 0.4–1.0)
Beta Globulin: 0.7 g/dL (ref 0.7–1.3)
Gamma Globulin: 0.6 g/dL (ref 0.4–1.8)
Globulin, Total: 2.3 g/dL (ref 2.2–3.9)
Total Protein ELP: 6.1 g/dL (ref 6.0–8.5)

## 2019-02-06 ENCOUNTER — Other Ambulatory Visit (HOSPITAL_COMMUNITY): Payer: Self-pay | Admitting: Emergency Medicine

## 2019-02-06 DIAGNOSIS — D649 Anemia, unspecified: Secondary | ICD-10-CM

## 2019-02-06 LAB — OCCULT BLOOD X 1 CARD TO LAB, STOOL
Fecal Occult Bld: NEGATIVE
Fecal Occult Bld: NEGATIVE
Fecal Occult Bld: NEGATIVE

## 2019-02-26 ENCOUNTER — Ambulatory Visit (HOSPITAL_COMMUNITY): Payer: Medicare Other | Admitting: Hematology

## 2019-02-27 ENCOUNTER — Ambulatory Visit (HOSPITAL_COMMUNITY): Payer: Medicare Other | Admitting: Hematology

## 2019-03-13 ENCOUNTER — Other Ambulatory Visit: Payer: Self-pay

## 2019-03-13 ENCOUNTER — Inpatient Hospital Stay (HOSPITAL_COMMUNITY): Payer: Medicare Other | Attending: Hematology | Admitting: Hematology

## 2019-03-13 ENCOUNTER — Encounter (HOSPITAL_COMMUNITY): Payer: Self-pay | Admitting: Hematology

## 2019-03-13 VITALS — BP 180/48 | HR 71 | Temp 97.1°F | Resp 18 | Wt 201.7 lb

## 2019-03-13 DIAGNOSIS — Z79899 Other long term (current) drug therapy: Secondary | ICD-10-CM | POA: Diagnosis not present

## 2019-03-13 DIAGNOSIS — Z90710 Acquired absence of both cervix and uterus: Secondary | ICD-10-CM | POA: Diagnosis not present

## 2019-03-13 DIAGNOSIS — N183 Chronic kidney disease, stage 3 (moderate): Secondary | ICD-10-CM | POA: Diagnosis not present

## 2019-03-13 DIAGNOSIS — E119 Type 2 diabetes mellitus without complications: Secondary | ICD-10-CM | POA: Diagnosis not present

## 2019-03-13 DIAGNOSIS — Z794 Long term (current) use of insulin: Secondary | ICD-10-CM | POA: Insufficient documentation

## 2019-03-13 DIAGNOSIS — Z7902 Long term (current) use of antithrombotics/antiplatelets: Secondary | ICD-10-CM | POA: Diagnosis not present

## 2019-03-13 DIAGNOSIS — I10 Essential (primary) hypertension: Secondary | ICD-10-CM | POA: Insufficient documentation

## 2019-03-13 DIAGNOSIS — D649 Anemia, unspecified: Secondary | ICD-10-CM | POA: Insufficient documentation

## 2019-03-13 DIAGNOSIS — I252 Old myocardial infarction: Secondary | ICD-10-CM | POA: Insufficient documentation

## 2019-03-13 NOTE — Progress Notes (Signed)
[place], [place] [address]   CLINIC:  Medical Oncology/Hematology  PCP:  Allie Dimmer, [place] [address] [phone]   REASON FOR VISIT:  Follow-up for anemia.  CURRENT THERAPY: Iron tablet daily.   INTERVAL HISTORY:  Joyce [name] 78 y.o. female seen for follow-up of normocytic anemia.  Denies any bleeding per rectum or melena.  She is on Plavix 75 mg daily.  Last blood transfusion was in November 2019 when she had MI.  Denies any fevers, night sweats or weight loss in the last 6 months.  However complains of feeling very tired all the time.  She also has problems with her hip arthritis and wants to have it replaced.    REVIEW OF SYSTEMS:  Review of Systems  Constitutional: Positive for fatigue.  All other systems reviewed and are negative.    PAST MEDICAL/SURGICAL HISTORY:  Past Medical History:  Diagnosis Date  . Anemia   . Arthritis   . Bilateral pleural effusion    Postoperative, status post Pleurx catheter and talc treatment - Dr. Prescott Gum  . 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 Chronic kidney disease (CKD), stage III (moderate) (HCC)   . Chronic lower back pain   . Essential hypertension   . History of pneumonia   . Hyperlipidemia   . Myocardial infarction ([facility])   . Neuromuscular disorder ([facility])   . Type II diabetes mellitus ([facility])    Past Surgical History:  Procedure Laterality Date  . ABDOMINAL HYSTERECTOMY  1980's  . ANTERIOR CERVICAL DECOMP/DISCECTOMY FUSION  2000's  . APPENDECTOMY  1970's  . BACK SURGERY    . CHEST TUBE INSERTION Left 08/07/2014   Procedure: INSERTION PLEURAL DRAINAGE CATHETER;  Surgeon: Ivin Poot, MD;  Location: [facility];  Service: Thoracic;  Laterality: Left;  . CHEST TUBE INSERTION Right 08/12/2014   Procedure: INSERTION PLEURAL DRAINAGE CATHETER;  Surgeon: Ivin Poot, MD;  Location: [facility];  Service: Thoracic;  Laterality: Right;  . CHOLECYSTECTOMY  ~ 2005  . CORONARY ARTERY BYPASS GRAFT N/A 06/15/2014   Procedure: CORONARY ARTERY BYPASS GRAFTING (CABG);  Surgeon: Ivin Poot, MD;  Location: [facility];  Service: Open Heart Surgery;  Laterality: N/A;  . CORONARY ATHERECTOMY N/A 05/22/2018   Procedure: CORONARY ATHERECTOMY;  Surgeon: Belva Crome, MD;  Location: [facility];  Service: Cardiovascular;  Laterality: N/A;  . CORONARY STENT INTERVENTION N/A 05/22/2018   Procedure: CORONARY STENT INTERVENTION;  Surgeon: Belva Crome, MD;  Location: [facility];  Service: Cardiovascular;  Laterality: N/A;  rca   . INTRAOPERATIVE TRANSESOPHAGEAL ECHOCARDIOGRAM N/A 06/15/2014   Procedure: INTRAOPERATIVE TRANSESOPHAGEAL ECHOCARDIOGRAM;  Surgeon: Ivin Poot, MD;  Location: [facility];  Service: Open Heart Surgery;  Laterality: N/A;  . LEFT HEART CATH AND CORS/GRAFTS ANGIOGRAPHY N/A 05/20/2018   Procedure: LEFT HEART CATH AND CORS/GRAFTS ANGIOGRAPHY;  Surgeon: Lorretta Harp, MD;  Location: [facility];  Service: Cardiovascular;  Laterality: N/A;  . LEFT HEART CATHETERIZATION WITH CORONARY ANGIOGRAM N/A 06/11/2014   Procedure: LEFT HEART CATHETERIZATION WITH CORONARY ANGIOGRAM;  Surgeon: Blane Ohara, MD;  [facility];  Service: Cardiovascular;  Laterality: N/A;  . LUMBAR DISC SURGERY  1980's X 1; 1990's X  2000's X 1  . REMOVAL OF PLEURAL DRAINAGE CATHETER Bilateral 02/11/2015  Procedure: REMOVAL OF PLEURAL DRAINAGE CATHETER;  Surgeon: Ivin Poot, MD;  Location: [facility];  Service: Thoracic;  Laterality: Bilateral;  . TALC PLEURODESIS Bilateral 02/11/2015   Procedure: Pietro Cassis;  Surgeon: Ivin Poot, MD;  Location: [facility];  Service: Thoracic;  Laterality: Bilateral;  . TONSILLECTOMY  ~ 1950     SOCIAL HISTORY:  Social History   Socioeconomic History  . Marital status: Widowed    Spouse name: Not on file   . Number of children: 2  . Years of education: Not on file  . Highest education level: Not on file  Occupational History  . Occupation: retired  Scientific laboratory technician  . Financial resource strain: Not on file  . Food insecurity    Worry: Not on file    Inability: Not on file  . Transportation needs    Medical: Not on file    Non-medical: Not on file  Tobacco Use  . Smoking status: Never Smoker  . Smokeless tobacco: Never Used  Substance and Sexual Activity  . Alcohol use: No    Alcohol/week: 0.0 standard drinks  . Drug use: No  . Sexual activity: Not on file  Lifestyle  . Physical activity    Days per week: Not on file    Minutes per session: Not on file  . Stress: Not on file  Relationships  . Social Herbalist on phone: Not on file    Gets together: Not on file    Attends religious service: Not on file    Active member of club or organization: Not on file    Attends meetings of clubs or organizations: Not on file    Relationship status: Not on file  . Intimate partner violence    Fear of current or ex partner: Not on file    Emotionally abused: Not on file    Physically abused: Not on file    Forced sexual activity: Not on file  Other Topics Concern  . Not on file  Social History Narrative  . Not on file    FAMILY HISTORY:  Family History  Problem Relation Age of Onset  . Stroke Father   . Diabetes Mother   . Hypertension Mother   . Alcoholism Brother   . Gout Brother   . Arthritis Brother   . Alcoholism Brother   . Heart disease Sister   . Heart attack Sister   . Cancer Sister        hodkins lymphoma  . CAD Paternal Grandmother        heart attack  . CAD Paternal Uncle        heart attack  . Heart attack Paternal Grandfather   . Osteoporosis Sister     CURRENT MEDICATIONS:  Outpatient Encounter Medications as of 03/13/2019  Medication Sig  . ACCU-CHEK AVIVA PLUS test strip Inject 1 strip as directed 4 (four) times daily.  Marland Kitchen acetaminophen  (TYLENOL) 325 MG tablet Take 650 mg by mouth every 4 (four) hours as needed.  Marland Kitchen albuterol (VENTOLIN HFA) 108 (90 Base) MCG/ACT inhaler Inhale 2 puffs into the lungs every 4 (four) hours as needed for wheezing or shortness of breath.  Marland Kitchen amLODipine (NORVASC) 10 MG tablet Take 10 mg by mouth daily.   Marland Kitchen atorvastatin (LIPITOR) 40 MG tablet Take 1 tablet (40 mg total) by mouth daily at 6 PM.  . busPIRone (BUSPAR) 5 MG tablet Take 5 mg by mouth as needed. Pt is supposed to take  TID but takes prn  . Cholecalciferol (VITAMIN D3) 2000 units TABS Take 2,000 Units by mouth daily.   . clopidogrel (PLAVIX) 75 MG tablet Take 1 tablet (75 mg total) by mouth daily with breakfast.  . ferrous sulfate 325 (65 FE) MG tablet TAKE ONE TABLET BY MOUTH DAILY  . fluticasone (FLONASE) 50 MCG/ACT nasal spray Place 1 spray into both nostrils daily.   . furosemide (LASIX) 40 MG tablet Take 40 mg by mouth daily.  Marland Kitchen GLUCERNA (GLUCERNA) LIQD Take 237 mLs by mouth daily.  . hydrALAZINE (APRESOLINE) 100 MG tablet Take 100 mg by mouth 3 (three) times daily.  . insulin glargine (LANTUS) 100 UNIT/ML injection Inject 40 Units into the skin at bedtime.   . insulin lispro (HUMALOG) 100 UNIT/ML KiwkPen Inject 8 Units into the skin 3 (three) times daily with meals.   . Insulin Syringe-Needle U-100 (INSULIN SYRINGE .5CC/30GX5/16") 30G X 5/16" 0.5 ML MISC Inject 0.5 mLs as directed 3 (three) times daily.  . isosorbide mononitrate (IMDUR) 60 MG 24 hr tablet Take 1 tablet (60 mg total) by mouth daily.  Marland Kitchen lisinopril (PRINIVIL,ZESTRIL) 5 MG tablet Take 5 mg by mouth daily.   . nitroGLYCERIN (NITROSTAT) 0.4 MG SL tablet Place 1 tablet (0.4 mg total) under the tongue every 5 (five) minutes x 3 doses as needed for chest pain.  . polyethylene glycol (MIRALAX / GLYCOLAX) packet Take 17 g by mouth daily as needed for mild constipation.  . primidone (MYSOLINE) 50 MG tablet Take 50 mg by mouth daily.   . Tiotropium Bromide-Olodaterol (STIOLTO RESPIMAT)  2.5-2.5 MCG/ACT AERS Take 2 puffs by mouth daily.  . traMADol (ULTRAM) 50 MG tablet Take 1 tablet (50 mg total) by mouth every 6 (six) hours as needed for moderate pain. (Patient taking differently: Take 50 mg by mouth every 4 (four) hours as needed for moderate pain or severe pain. )  . vitamin C (ASCORBIC ACID) 500 MG tablet Take 500 mg by mouth daily.   No facility-administered encounter medications on file as of 03/13/2019.     ALLERGIES:  Allergies  Allergen Reactions  . Carvedilol Other (See Comments)    confusion  . Codeine Other (See Comments)    confusion  . Metoprolol Palpitations     PHYSICAL EXAM:  ECOG Performance status: 1  Vitals:   03/13/19 1604  BP: (!) 180/48  Pulse: 71  Resp: 18  Temp: (!) 97.1 F (36.2 C)  SpO2: 97%   Filed Weights   03/13/19 1604  Weight: 201 lb 11.2 oz (91.5 kg)    Physical Exam Vitals signs reviewed.  Constitutional:      Appearance: Normal appearance.  Cardiovascular:     Rate and Rhythm: Normal rate and regular rhythm.     Heart sounds: Normal heart sounds.  Pulmonary:     Effort: Pulmonary effort is normal.     Breath sounds: Normal breath sounds.  Abdominal:     General: There is no distension.     Palpations: Abdomen is soft. There is no mass.  Musculoskeletal:        General: No swelling.  Skin:    General: Skin is warm.  Neurological:     General: No focal deficit present.     Mental Status: She is alert and oriented to person, place, and time.  Psychiatric:        Mood and Affect: Mood normal.        Behavior: Behavior normal.      LABORATORY  DATA:  I have reviewed the labs as listed.  CBC    Component Value Date/Time   WBC 6.3 01/30/2019 1441   RBC 3.51 (L) 01/30/2019 1441   RBC 3.51 (L) 01/30/2019 1441   HGB 10.2 (L) 01/30/2019 1441   HCT 31.7 (L) 01/30/2019 1441   PLT 148 (L) 01/30/2019 1441   MCV 90.3 01/30/2019 1441   MCH 29.1 01/30/2019 1441   MCHC 32.2 01/30/2019 1441   RDW 13.0  01/30/2019 1441   LYMPHSABS 1.0 01/30/2019 1441   MONOABS 0.4 01/30/2019 1441   EOSABS 0.1 01/30/2019 1441   BASOSABS 0.0 01/30/2019 1441   CMP Latest Ref Rng & Units 01/30/2019 05/29/2018 05/26/2018  Glucose 70 - 99 mg/dL 78 128(H) 130(H)  BUN 8 - 23 mg/dL 37(H) 36(H) 38(H)  Creatinine 0.44 - 1.00 mg/dL 1.86(H) 1.76(H) 2.03(H)  Sodium 135 - 145 mmol/L 134(L) 138 134(L)  Potassium 3.5 - 5.1 mmol/L 4.6 5.1 4.8  Chloride 98 - 111 mmol/L 103 108 105  CO2 22 - 32 mmol/L 27 20(L) 22  Calcium 8.9 - 10.3 mg/dL 8.7(L) 8.9 8.5(L)  Total Protein 6.5 - 8.1 g/dL 6.5 - -  Total Bilirubin 0.3 - 1.2 mg/dL 0.5 - -  Alkaline Phos 38 - 126 U/L 54 - -  AST 15 - 41 U/L 16 - -  ALT 0 - 44 U/L 15 - -       DIAGNOSTIC IMAGING:  I have independently reviewed the scans and discussed with the patient.     ASSESSMENT & PLAN:   Normocytic anemia 1.  Normocytic anemia: - Normocytic anemia since 2015.  CKD since 2012. - She has been taking iron tablet daily since #2019.  She reports mild constipation and takes prune juice. - Last colonoscopy 6 years ago in [place] by Dr. Matt Holmes, apparently normal. - Denies any easy bruising or bleeding.  She is on Plavix 75 mg daily. - Last blood transfusion 2 units of PRBC in November 2019 when patient had MI. - We reviewed blood work from 01/30/2019.  Hemoglobin is 10.2.  Ferritin is 93.  Percent saturation is 24.  L07 and folic acid were normal.  Creatinine is stable around 1.86.  SPEP is negative. - Stool for occult blood x3 is negative. -She reports severe tiredness.  I have recommended parenteral iron therapy with Feraheme weekly x2. -We discussed about side effects including rare chance of anaphylactic reactions in detail. - We will schedule her for Feraheme.  We will see her back in 3 months with repeat CBC, ferritin and iron panel.  Total time spent is 25 minutes with more than 50% of the time spent face-to-face discussing lab results, treatment plan,  counseling and coordination of care.    Orders placed this encounter:  Orders Placed This Encounter  Procedures  . CBC with Differential/Platelet  . Comprehensive metabolic panel  . Iron and TIBC  . Ferritin      Derek Jack, MD [facility] [phone]

## 2019-03-13 NOTE — Assessment & Plan Note (Addendum)
1.  Normocytic anemia: - Normocytic anemia since 2015.  CKD since 2012. - She has been taking iron tablet daily since November 2019.  She reports mild constipation and takes prune juice. - Last colonoscopy 6 years ago in [place] by Dr. Matt Holmes, apparently normal. - Denies any easy bruising or bleeding.  She is on Plavix 75 mg daily. - Last blood transfusion 2 units of PRBC in November 2019 when patient had MI. - We reviewed blood work from 01/30/2019.  Hemoglobin is 10.2.  Ferritin is 93.  Percent saturation is 24.  Q59 and folic acid were normal.  Creatinine is stable around 1.86.  SPEP is negative. - Stool for occult blood x3 is negative. -She reports severe tiredness.  I have recommended parenteral iron therapy with Feraheme weekly x2. -We discussed about side effects including rare chance of anaphylactic reactions in detail. - We will schedule her for Feraheme.  We will see her back in 3 months with repeat CBC, ferritin and iron panel.

## 2019-03-18 ENCOUNTER — Inpatient Hospital Stay (HOSPITAL_COMMUNITY): Payer: Medicare Other

## 2019-03-18 ENCOUNTER — Other Ambulatory Visit: Payer: Self-pay

## 2019-03-18 VITALS — BP 155/59 | HR 67 | Temp 97.6°F | Resp 18

## 2019-03-18 DIAGNOSIS — D649 Anemia, unspecified: Secondary | ICD-10-CM

## 2019-03-18 DIAGNOSIS — N183 Chronic kidney disease, stage 3 unspecified: Secondary | ICD-10-CM

## 2019-03-18 MED ORDER — SODIUM CHLORIDE 0.9 % IV SOLN
Freq: Once | INTRAVENOUS | Status: AC
  Administered 2019-03-18: 14:00:00 via INTRAVENOUS

## 2019-03-18 MED ORDER — SODIUM CHLORIDE 0.9 % IV SOLN
510.0000 mg | Freq: Once | INTRAVENOUS | Status: AC
  Administered 2019-03-18: 15:00:00 510 mg via INTRAVENOUS
  Filled 2019-03-18: qty 510

## 2019-03-25 ENCOUNTER — Encounter (HOSPITAL_COMMUNITY): Payer: Self-pay

## 2019-03-25 ENCOUNTER — Other Ambulatory Visit: Payer: Self-pay

## 2019-03-25 ENCOUNTER — Inpatient Hospital Stay (HOSPITAL_COMMUNITY): Payer: Medicare Other

## 2019-03-25 VITALS — BP 148/49 | HR 63 | Temp 97.3°F | Resp 18

## 2019-03-25 DIAGNOSIS — D649 Anemia, unspecified: Secondary | ICD-10-CM | POA: Diagnosis not present

## 2019-03-25 DIAGNOSIS — N183 Chronic kidney disease, stage 3 unspecified: Secondary | ICD-10-CM

## 2019-03-25 MED ORDER — SODIUM CHLORIDE 0.9 % IV SOLN
510.0000 mg | Freq: Once | INTRAVENOUS | Status: AC
  Administered 2019-03-25: 510 mg via INTRAVENOUS
  Filled 2019-03-25: qty 510

## 2019-03-25 MED ORDER — SODIUM CHLORIDE 0.9 % IV SOLN
Freq: Once | INTRAVENOUS | Status: AC
  Administered 2019-03-25: 14:00:00 via INTRAVENOUS

## 2019-03-25 NOTE — Progress Notes (Signed)
Feraheme given today per MD orders. Tolerated infusion without adverse affects. Vital signs stable. No complaints at this time. Discharged from clinic ambulatory. F/U with [facility] as scheduled.  

## 2019-03-25 NOTE — Patient Instructions (Signed)
[facility] at [facility]  Discharge Instructions:   _______________________________________________________________  Thank you for choosing [facility] at [facility] to provide your oncology and hematology care.  To afford each patient quality time with our providers, please arrive at least 15 minutes before your scheduled appointment.  You need to re-schedule your appointment if you arrive 10 or more minutes late.  We strive to give you quality time with our providers, and arriving late affects you and other patients whose appointments are after yours.  Also, if you no show three or more times for appointments you may be dismissed from the clinic.  Again, thank you for choosing [facility] at [facility]. Our hope is that these requests will allow you access to exceptional care and in a timely manner. _______________________________________________________________  If you have questions after your visit, please contact our office at [phone] between the hours of 8:30 a.m. and 5:00 p.m. Voicemails left after 4:30 p.m. will not be returned until the following business day. _______________________________________________________________  For prescription refill requests, have your pharmacy contact our office. _______________________________________________________________  Recommendations made by the consultant and any test results will be sent to your referring physician. _______________________________________________________________ 

## 2019-03-28 ENCOUNTER — Telehealth: Payer: Self-pay | Admitting: Cardiology

## 2019-03-28 NOTE — Telephone Encounter (Signed)
Virtual Visit Pre-Appointment Phone Call  "(Name), I am calling you today to discuss your upcoming appointment. We are currently trying to limit exposure to the virus that causes COVID-19 by seeing patients at home rather than in the office."  1. "What is the BEST phone number to call the day of the visit?" - include this in appointment notes  2. Do you have or have access to (through a family member/friend) a smartphone with video capability that we can use for your visit?" a. If yes - list this number in appt notes as cell (if different from BEST phone #) and list the appointment type as a VIDEO visit in appointment notes b. If no - list the appointment type as a PHONE visit in appointment notes  3. Confirm consent - "In the setting of the current Covid19 crisis, you are scheduled for a (phone or video) visit with your provider on (date) at (time).  Just as we do with many in-office visits, in order for you to participate in this visit, we must obtain consent.  If you'd like, I can send this to your mychart (if signed up) or email for you to review.  Otherwise, I can obtain your verbal consent now.  All virtual visits are billed to your insurance company just like a normal visit would be.  By agreeing to a virtual visit, we'd like you to understand that the technology does not allow for your provider to perform an examination, and thus may limit your provider's ability to fully assess your condition. If your provider identifies any concerns that need to be evaluated in person, we will make arrangements to do so.  Finally, though the technology is pretty good, we cannot assure that it will always work on either your or our end, and in the setting of a video visit, we may have to convert it to a phone-only visit.  In either situation, we cannot ensure that we have a secure connection.  Are you willing to proceed?" STAFF: Did the patient verbally acknowledge consent to telehealth visit? Document  YES/NO here: yes  4. Advise patient to be prepared - "Two hours prior to your appointment, go ahead and check your blood pressure, pulse, oxygen saturation, and your weight (if you have the equipment to check those) and write them all down. When your visit starts, your provider will ask you for this information. If you have an Apple Watch or Kardia device, please plan to have heart rate information ready on the day of your appointment. Please have a pen and paper handy nearby the day of the visit as well."  5. Give patient instructions for MyChart download to smartphone OR [email or web address] as below if video visit (depending on what platform provider is using)  6. Inform patient they will receive a phone call 15 minutes prior to their appointment time (may be from unknown caller ID) so they should be prepared to answer    TELEPHONE CALL NOTE  Joyce [name] has been deemed a candidate for a follow-up tele-health visit to limit community exposure during the Covid-19 pandemic. I spoke with the patient via phone to ensure availability of phone/video source, confirm preferred email & phone number, and discuss instructions and expectations.  I reminded Joyce [name] to be prepared with any vital sign and/or heart rhythm information that could potentially be obtained via home monitoring, at the time of her visit. I reminded Joyce [name] to expect a phone call prior to  her visit.  Joyce [name] 03/28/2019 9:45 AM   INSTRUCTIONS FOR DOWNLOADING THE MYCHART APP TO SMARTPHONE  - The patient must first make sure to have activated MyChart and know their login information - If Apple, go to [employer] and type in MyChart in the search bar and download the app. If Android, ask patient to go to [employer] and type in [place] in the search bar and download the app. The app is free but as with any other app downloads, their phone may require them to verify saved payment information or  Apple/Android password.  - The patient will need to then log into the app with their MyChart username and password, and select [facility] as their healthcare provider to link the account. When it is time for your visit, go to the MyChart app, find appointments, and click Begin Video Visit. Be sure to Select Allow for your device to access the Microphone and Camera for your visit. You will then be connected, and your provider will be with you shortly.  **If they have any issues connecting, or need assistance please contact MyChart service desk [phone])**  **If using a computer, in order to ensure the best quality for their visit they will need to use either of the following Internet Browsers: [employer], or Google Chrome**  IF USING DOXIMITY or DOXY.ME - The patient will receive a link just prior to their visit by text.     FULL LENGTH CONSENT FOR TELE-HEALTH VISIT   I hereby voluntarily request, consent and authorize [facility] and its employed or contracted physicians, physician assistants, nurse practitioners or other licensed health care professionals (the Practitioner), to provide me with telemedicine health care services (the Services") as deemed necessary by the treating Practitioner. I acknowledge and consent to receive the Services by the Practitioner via telemedicine. I understand that the telemedicine visit will involve communicating with the Practitioner through live audiovisual communication technology and the disclosure of certain medical information by electronic transmission. I acknowledge that I have been given the opportunity to request an in-person assessment or other available alternative prior to the telemedicine visit and am voluntarily participating in the telemedicine visit.  I understand that I have the right to withhold or withdraw my consent to the use of telemedicine in the course of my care at any time, without affecting my right to future care  or treatment, and that the Practitioner or I may terminate the telemedicine visit at any time. I understand that I have the right to inspect all information obtained and/or recorded in the course of the telemedicine visit and may receive copies of available information for a reasonable fee.  I understand that some of the potential risks of receiving the Services via telemedicine include:   Delay or interruption in medical evaluation due to technological equipment failure or disruption;  Information transmitted may not be sufficient (e.g. poor resolution of images) to allow for appropriate medical decision making by the Practitioner; and/or   In rare instances, security protocols could fail, causing a breach of personal health information.  Furthermore, I acknowledge that it is my responsibility to provide information about my medical history, conditions and care that is complete and accurate to the best of my ability. I acknowledge that Practitioner's advice, recommendations, and/or decision may be based on factors not within their control, such as incomplete or inaccurate data provided by me or distortions of diagnostic images or specimens that may result from electronic transmissions. I  understand that the practice of medicine is not an exact science and that Practitioner makes no warranties or guarantees regarding treatment outcomes. I acknowledge that I will receive a copy of this consent concurrently upon execution via email to the email address I last provided but may also request a printed copy by calling the office of [facility].    I understand that my insurance will be billed for this visit.   I have read or had this consent read to me.  I understand the contents of this consent, which adequately explains the benefits and risks of the Services being provided via telemedicine.   I have been provided ample opportunity to ask questions regarding this consent and the Services and have had  my questions answered to my satisfaction.  I give my informed consent for the services to be provided through the use of telemedicine in my medical care  By participating in this telemedicine visit I agree to the above.

## 2019-04-02 ENCOUNTER — Telehealth: Payer: Medicare Other | Admitting: Cardiology

## 2019-04-16 ENCOUNTER — Encounter: Payer: Self-pay | Admitting: Cardiology

## 2019-04-16 ENCOUNTER — Telehealth: Payer: Medicare Other | Admitting: Cardiology

## 2019-04-16 ENCOUNTER — Telehealth (INDEPENDENT_AMBULATORY_CARE_PROVIDER_SITE_OTHER): Payer: Medicare Other | Admitting: Cardiology

## 2019-04-16 VITALS — HR 79 | Ht 65.0 in | Wt 199.2 lb

## 2019-04-16 DIAGNOSIS — E782 Mixed hyperlipidemia: Secondary | ICD-10-CM

## 2019-04-16 DIAGNOSIS — N1832 Chronic kidney disease, stage 3b: Secondary | ICD-10-CM | POA: Diagnosis not present

## 2019-04-16 DIAGNOSIS — I10 Essential (primary) hypertension: Secondary | ICD-10-CM

## 2019-04-16 DIAGNOSIS — I25119 Atherosclerotic heart disease of native coronary artery with unspecified angina pectoris: Secondary | ICD-10-CM

## 2019-04-16 NOTE — Progress Notes (Signed)
Virtual Visit via Telephone Note   This visit type was conducted due to national recommendations for restrictions regarding the COVID-19 Pandemic (e.g. social distancing) in an effort to limit this patient's exposure and mitigate transmission in our community.  Due to her co-morbid illnesses, this patient is at least at moderate risk for complications without adequate follow up.  This format is felt to be most appropriate for this patient at this time.  The patient did not have access to video technology/had technical difficulties with video requiring transitioning to audio format only (telephone).  All issues noted in this document were discussed and addressed.  No physical exam could be performed with this format.  Please refer to the patient's chart for her  consent to telehealth for [facility]   Date:  04/16/2019   ID:  Joyce [name], DOB [birth year], MRN [id number]  Patient Location: Home Provider Location: Home  PCP:  Allie Dimmer, MD  Cardiologist:  Rozann Lesches, MD Electrophysiologist:  None   Evaluation Performed:  Follow-Up Visit  Chief Complaint:   Cardiac follow-up  History of Present Illness:    Joyce [name] is a 78 y.o. female last seen in March.  We spoke by phone today.  She tells me that she has been doing fairly well overall.  She had one episode of angina a few months ago, this has not been recurrent.  She is also now following with hematology for treatment of normocytic anemia and iron deficiency, stools heme-negative.  She has been treated with Feraheme and reports much better energy, her last hemoglobin was up to 10.2.  I reviewed her interval lab work as outlined below.  She reports compliance with her cardiac medications.  Last LDL was 49.  The patient does not have symptoms concerning for COVID-19 infection (fever, chills, cough, or new shortness of breath).    Past Medical History:  Diagnosis Date  . Anemia   . Arthritis   . Bilateral  pleural effusion    Postoperative, status post Pleurx catheter and talc treatment - Dr. Prescott Gum  . 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 Chronic kidney disease (CKD), stage III (moderate)   . Chronic lower back pain   . Essential hypertension   . History of pneumonia   . Hyperlipidemia   . Myocardial infarction ([place])   . Neuromuscular disorder ([place])   . Type II diabetes mellitus ([place])    Past Surgical History:  Procedure Laterality Date  . ABDOMINAL HYSTERECTOMY  1980's  . ANTERIOR CERVICAL DECOMP/DISCECTOMY FUSION  2000's  . APPENDECTOMY  1970's  . BACK SURGERY    . CHEST TUBE INSERTION Left 08/07/2014   Procedure: INSERTION PLEURAL DRAINAGE CATHETER;  Surgeon: Ivin Poot, MD;  Location: [place];  Service: Thoracic;  Laterality: Left;  . CHEST TUBE INSERTION Right 08/12/2014   Procedure: INSERTION PLEURAL DRAINAGE CATHETER;  Surgeon: Ivin Poot, MD;  Location: [place];  Service: Thoracic;  Laterality: Right;  . CHOLECYSTECTOMY  ~ 2005  . CORONARY ARTERY BYPASS GRAFT N/A 06/15/2014   Procedure: CORONARY ARTERY BYPASS GRAFTING (CABG);  Surgeon: Ivin Poot, MD;  Location: [place];  Service: Open Heart Surgery;  Laterality: N/A;  . CORONARY ATHERECTOMY N/A 05/22/2018   Procedure: CORONARY ATHERECTOMY;  Surgeon: Belva Crome, MD;  Location: [place] CV LAB;  Service: Cardiovascular;  Laterality: N/A;  .  CORONARY STENT INTERVENTION N/A 05/22/2018   Procedure: CORONARY STENT INTERVENTION;  Surgeon: Belva Crome, MD;  Location: [place] CV LAB;  Service: Cardiovascular;  Laterality: N/A;  rca   . INTRAOPERATIVE TRANSESOPHAGEAL ECHOCARDIOGRAM N/A 06/15/2014   Procedure: INTRAOPERATIVE TRANSESOPHAGEAL ECHOCARDIOGRAM;  Surgeon: Ivin Poot, MD;  Location: [place];  Service: Open Heart Surgery;  Laterality: N/A;  . LEFT HEART CATH AND CORS/GRAFTS ANGIOGRAPHY N/A 05/20/2018    Procedure: LEFT HEART CATH AND CORS/GRAFTS ANGIOGRAPHY;  Surgeon: Lorretta Harp, MD;  Location: [place] CV LAB;  Service: Cardiovascular;  Laterality: N/A;  . LEFT HEART CATHETERIZATION WITH CORONARY ANGIOGRAM N/A 06/11/2014   Procedure: LEFT HEART CATHETERIZATION WITH CORONARY ANGIOGRAM;  Surgeon: Blane Ohara, MD;  Location: [facility] C[facility];  Service: Cardiovascular;  Laterality: N/A;  . LUMBAR DISC SURGERY  1980's X 1; 1990's X  2000's X 1  . REMOVAL OF PLEURAL DRAINAGE CATHETER Bilateral 02/11/2015   Procedure: REMOVAL OF PLEURAL DRAINAGE CATHETER;  Surgeon: Ivin Poot, MD;  Location: [place];  Service: Thoracic;  Laterality: Bilateral;  . TALC PLEURODESIS Bilateral 02/11/2015   Procedure: Pietro Cassis;  Surgeon: Ivin Poot, MD;  Location: [place];  Service: Thoracic;  Laterality: Bilateral;  . TONSILLECTOMY  ~ 1950     Current Meds  Medication Sig  . ACCU-CHEK AVIVA PLUS test strip Inject 1 strip as directed 4 (four) times daily.  Marland Kitchen acetaminophen (TYLENOL) 325 MG tablet Take 650 mg by mouth every 4 (four) hours as needed.  Marland Kitchen albuterol (VENTOLIN HFA) 108 (90 Base) MCG/ACT inhaler Inhale 2 puffs into the lungs every 4 (four) hours as needed for wheezing or shortness of breath.  Marland Kitchen amLODipine (NORVASC) 10 MG tablet Take 10 mg by mouth daily.   Marland Kitchen atorvastatin (LIPITOR) 40 MG tablet TAKE ONE TABLET BY MOUTH DAILY  . busPIRone (BUSPAR) 5 MG tablet Take 5 mg by mouth as needed. Pt is supposed to take TID but takes prn  . Cholecalciferol (VITAMIN D3) 2000 units TABS Take 2,000 Units by mouth daily.   . clopidogrel (PLAVIX) 75 MG tablet Take 1 tablet (75 mg total) by mouth daily with breakfast.  . ferrous sulfate 325 (65 FE) MG tablet TAKE ONE TABLET BY MOUTH DAILY  . fluticasone (FLONASE) 50 MCG/ACT nasal spray Place 1 spray into both nostrils daily.   . furosemide (LASIX) 40 MG tablet Take 40 mg by mouth daily.  Marland Kitchen GLUCERNA (GLUCERNA) LIQD Take 237 mLs by mouth daily.  .  hydrALAZINE (APRESOLINE) 100 MG tablet Take 100 mg by mouth 3 (three) times daily.  . insulin glargine (LANTUS) 100 UNIT/ML injection Inject 40 Units into the skin at bedtime.   . insulin lispro (HUMALOG) 100 UNIT/ML KiwkPen Inject 8 Units into the skin 3 (three) times daily with meals.   . Insulin Syringe-Needle U-100 (INSULIN SYRINGE .5CC/30GX5/16") 30G X 5/16" 0.5 ML MISC Inject 0.5 mLs as directed 3 (three) times daily.  . isosorbide mononitrate (IMDUR) 60 MG 24 hr tablet Take 1 tablet (60 mg total) by mouth daily.  Marland Kitchen lisinopril (PRINIVIL,ZESTRIL) 5 MG tablet Take 5 mg by mouth daily.   . nitroGLYCERIN (NITROSTAT) 0.4 MG SL tablet Place 1 tablet (0.4 mg total) under the tongue every 5 (five) minutes x 3 doses as needed for chest pain.  . polyethylene glycol (MIRALAX / GLYCOLAX) packet Take 17 g by mouth daily as needed for mild constipation.  . primidone (MYSOLINE) 50 MG tablet Take 50 mg by mouth daily.   Marland Kitchen  Tiotropium Bromide-Olodaterol (STIOLTO RESPIMAT) 2.5-2.5 MCG/ACT AERS Take 2 puffs by mouth daily.  . traMADol (ULTRAM) 50 MG tablet TAKE TWO TABLETS BY MOUTH EVERY 8 HOURS AS NEEDED FOR PAIN  . vitamin C (ASCORBIC ACID) 500 MG tablet Take 500 mg by mouth daily.     Allergies:   Carvedilol, Codeine, and Metoprolol   Social History   Tobacco Use  . Smoking status: Never Smoker  . Smokeless tobacco: Never Used  Substance Use Topics  . Alcohol use: No    Alcohol/week: 0.0 standard drinks  . Drug use: No     Family Hx: The patient's family history includes Alcoholism in her brother and brother; Arthritis in her brother; CAD in her paternal grandmother and paternal uncle; Cancer in her sister; Diabetes in her mother; Gout in her brother; Heart attack in her paternal grandfather and sister; Heart disease in her sister; Hypertension in her mother; Osteoporosis in her sister; Stroke in her father.  ROS:   Please see the history of present illness. Chronic hip pain. All other systems  reviewed and are negative.   Prior CV studies:   The following studies were reviewed today:  Echocardiogram 05/19/2018: Study Conclusions  - Left ventricle: The cavity size was normal. Wall thickness was increased in a pattern of moderate LVH. Systolic function was normal. The estimated ejection fraction was in the range of 60% to 65%. Possible basal inferior hypokinesis. Doppler parameters are consistent with abnormal left ventricular relaxation (grade 1 diastolic dysfunction). The E/e&' ratio is between 8-15, suggesting indeterminate LV filling pressure. - Mitral valve: Mildly thickened leaflets . There was mild regurgitation. - Left atrium: The atrium was mildly dilated. - Right atrium: The atrium was mildly dilated. - Inferior vena cava: The vessel was normal in size. The respirophasic diameter changes were in the normal range (= 50%), consistent with normal central venous pressure.  Impressions:  - Compared to a prior study in 2018, the LVEF is lower at 60-65% with moderate LVH and mild biatrial enlargment. There is basal inferior hypokinesis.  Cardiac catheterization 05/20/2018:  Prox LAD to Mid LAD lesion is 100% stenosed.  Origin lesion is 100% stenosed.  Origin to Prox Graft lesion is 100% stenosed.  Origin to Prox Graft lesion is 100% stenosed.  Ramus lesion is 95% stenosed.  Prox Cx to Mid Cx lesion is 90% stenosed.  Prox LAD lesion is 80% stenosed.  Prox RCA lesion is 90% stenosed.  Mid RCA lesion is 99% stenosed.  Dist RCA lesion is 60% stenosed.  IMPRESSION:Joyce [name] is now 4 years post coronary artery bypass grafting x4. All of her vein grafts are occluded at the aorta. LIMA is patent to LAD. She has high-grade calcified proximal mid and distal dominant RCA stenosis along with a high-grade mid ramus branch stenosis just proximal to the occluded vein graft. I used 100 cc of contrast and and decided to stage her  intervention given her chronic renal insufficiency. I performed a femoral angiogram and Mynx closed her right common femoral puncture site. I did give her 40 mg of IV Lasix because of an elevated LVEDP of 19 as well as hydralazine IV for retention. She left the lab in stable condition.Marland Kitchen  PCI 05/22/2018:  Complex with orbital atherectomy followed by stenting of the proximal mid and distal RCA.  The mid and distal lesions were reduced from 99 and 70% stenosis to 0 and 0% stenosis using a 2.75 x 34 mm Onyx postdilated to 3.0 mm in diameter with  TIMI grade III flow.  The proximal 85% RCA with TIMI grade III flow was reduced to 0% using a 22 x 3.5 mm Onyx postdilated to 3.75 mm in diameter with resultant TIMI grade III flow.  RECOMMENDATIONS:   Aspirin and Plavix dual antiplatelet therapy for at least 30 days and preferably for 3 months at which point Plavix monotherapy can be used to complete a 50-month duration of therapy.  Labs/Other Tests and Data Reviewed:    EKG:  An ECG dated 06/04/2018 was personally reviewed today and demonstrated:  Sinus rhythm with PACs.  Recent Labs: 01/30/2019: ALT 15; BUN 37; Creatinine, Ser 1.86; Hemoglobin 10.2; Platelets 148; Potassium 4.6; Sodium 134; TSH 4.584  August 2020: Potassium 4.6, BUN 37, creatinine 1.86, hemoglobin 10.2, platelets 148 16, ALT 08 Nov 2018: Cholesterol 122, HDL 46, triglycerides 136, LDL 49  Wt Readings from Last 3 Encounters:  04/16/19 199 lb 3.2 oz (90.4 kg)  03/13/19 201 lb 11.2 oz (91.5 kg)  01/30/19 198 lb 9.6 oz (90.1 kg)     Objective:    Vital Signs:  Pulse 79   Ht 5\' 5"  (1.651 m)   Wt 199 lb 3.2 oz (90.4 kg)   SpO2 97%   BMI 33.15 kg/m    Patient spoke in full sentences, not short of breath. No audible wheezing or coughing.  ASSESSMENT & PLAN:    1.  CAD status post CABG in 2015 with subsequent graft failure and ultimately atherectomy with stent intervention to the mid to distal RCA in November 2019.  She  reports no progressive angina and we will plan to continue medical therapy and observation.  No change to current regimen.  2.  Mixed hyperlipidemia, she continues on Lipitor with recent LDL 49.  3.  Normocytic anemia with iron deficiency, heme-negative stools.  She has done well with parenteral iron and feels much better.  Keep follow-up with hematology.  4.  Essential hypertension, no changes made to current regimen.  5.  CKD stage III, last creatinine 1.86.  COVID-19 Education: The signs and symptoms of COVID-19 were discussed with the patient and how to seek care for testing (follow up with PCP or arrange E-visit).  The importance of social distancing was discussed today.  Time:   Today, I have spent 11 minutes with the patient with telehealth technology discussing the above problems.     Medication Adjustments/Labs and Tests Ordered: Current medicines are reviewed at length with the patient today.  Concerns regarding medicines are outlined above.   Tests Ordered: No orders of the defined types were placed in this encounter.   Medication Changes: No orders of the defined types were placed in this encounter.   Follow Up:  In Person 6 months in the [place] office.  Signed, Rozann Lesches, MD  04/16/2019 11:39 AM    [place]

## 2019-04-16 NOTE — Patient Instructions (Addendum)

## 2019-05-07 ENCOUNTER — Other Ambulatory Visit: Payer: Self-pay | Admitting: Physician Assistant

## 2019-05-07 NOTE — Telephone Encounter (Signed)
Please review for refill. Thanks!  

## 2019-05-13 ENCOUNTER — Telehealth: Payer: Self-pay | Admitting: Cardiology

## 2019-05-13 NOTE — Telephone Encounter (Signed)
Contacted patient and advised that her cardiac medications shouldn't be causing constipation to the extent of needing a GI referral from our office. Advised to contact her PCP again about his matter. Verbalized understanding of plan.

## 2019-05-13 NOTE — Telephone Encounter (Signed)
Patient called stating that she is having issues with constipation. Wanting to know if any medications could be causing this. She has spoken to her PCP was told to eat more fiber. Patient is wanting to know if we can refer her to a gastroenterologist.

## 2019-05-16 DIAGNOSIS — K5909 Other constipation: Secondary | ICD-10-CM | POA: Insufficient documentation

## 2019-06-04 ENCOUNTER — Inpatient Hospital Stay (HOSPITAL_COMMUNITY): Payer: Medicare Other | Attending: Hematology

## 2019-06-04 ENCOUNTER — Other Ambulatory Visit: Payer: Self-pay

## 2019-06-04 DIAGNOSIS — Z8261 Family history of arthritis: Secondary | ICD-10-CM | POA: Insufficient documentation

## 2019-06-04 DIAGNOSIS — Z7902 Long term (current) use of antithrombotics/antiplatelets: Secondary | ICD-10-CM | POA: Diagnosis not present

## 2019-06-04 DIAGNOSIS — Z79899 Other long term (current) drug therapy: Secondary | ICD-10-CM | POA: Diagnosis not present

## 2019-06-04 DIAGNOSIS — Z794 Long term (current) use of insulin: Secondary | ICD-10-CM | POA: Insufficient documentation

## 2019-06-04 DIAGNOSIS — E119 Type 2 diabetes mellitus without complications: Secondary | ICD-10-CM | POA: Diagnosis not present

## 2019-06-04 DIAGNOSIS — Z833 Family history of diabetes mellitus: Secondary | ICD-10-CM | POA: Insufficient documentation

## 2019-06-04 DIAGNOSIS — Z90710 Acquired absence of both cervix and uterus: Secondary | ICD-10-CM | POA: Diagnosis not present

## 2019-06-04 DIAGNOSIS — I252 Old myocardial infarction: Secondary | ICD-10-CM | POA: Diagnosis not present

## 2019-06-04 DIAGNOSIS — N183 Chronic kidney disease, stage 3 unspecified: Secondary | ICD-10-CM | POA: Diagnosis not present

## 2019-06-04 DIAGNOSIS — D649 Anemia, unspecified: Secondary | ICD-10-CM

## 2019-06-04 DIAGNOSIS — Z8249 Family history of ischemic heart disease and other diseases of the circulatory system: Secondary | ICD-10-CM | POA: Diagnosis not present

## 2019-06-04 DIAGNOSIS — I10 Essential (primary) hypertension: Secondary | ICD-10-CM | POA: Insufficient documentation

## 2019-06-04 LAB — COMPREHENSIVE METABOLIC PANEL
ALT: 19 U/L (ref 0–44)
AST: 23 U/L (ref 15–41)
Albumin: 3.8 g/dL (ref 3.5–5.0)
Alkaline Phosphatase: 55 U/L (ref 38–126)
Anion gap: 12 (ref 5–15)
BUN: 41 mg/dL — ABNORMAL HIGH (ref 8–23)
CO2: 21 mmol/L — ABNORMAL LOW (ref 22–32)
Calcium: 8.9 mg/dL (ref 8.9–10.3)
Chloride: 103 mmol/L (ref 98–111)
Creatinine, Ser: 1.89 mg/dL — ABNORMAL HIGH (ref 0.44–1.00)
GFR calc Af Amer: 29 mL/min — ABNORMAL LOW (ref >60)
GFR calc non Af Amer: 25 mL/min — ABNORMAL LOW (ref >60)
Glucose, Bld: 103 mg/dL — ABNORMAL HIGH (ref 70–99)
Potassium: 4.9 mmol/L (ref 3.5–5.1)
Sodium: 136 mmol/L (ref 135–145)
Total Bilirubin: 0.6 mg/dL (ref 0.3–1.2)
Total Protein: 6.6 g/dL (ref 6.5–8.1)

## 2019-06-04 LAB — CBC WITH DIFFERENTIAL/PLATELET
Abs Immature Granulocytes: 0.01 K/uL (ref 0.00–0.07)
Basophils Absolute: 0 K/uL (ref 0.0–0.1)
Basophils Relative: 0 %
Eosinophils Absolute: 0.1 K/uL (ref 0.0–0.5)
Eosinophils Relative: 2 %
HCT: 30.9 % — ABNORMAL LOW (ref 36.0–46.0)
Hemoglobin: 10 g/dL — ABNORMAL LOW (ref 12.0–15.0)
Immature Granulocytes: 0 %
Lymphocytes Relative: 29 %
Lymphs Abs: 1.4 K/uL (ref 0.7–4.0)
MCH: 30.1 pg (ref 26.0–34.0)
MCHC: 32.4 g/dL (ref 30.0–36.0)
MCV: 93.1 fL (ref 80.0–100.0)
Monocytes Absolute: 0.5 K/uL (ref 0.1–1.0)
Monocytes Relative: 9 %
Neutro Abs: 3 K/uL (ref 1.7–7.7)
Neutrophils Relative %: 60 %
Platelets: 162 K/uL (ref 150–400)
RBC: 3.32 MIL/uL — ABNORMAL LOW (ref 3.87–5.11)
RDW: 13 % (ref 11.5–15.5)
WBC: 5 K/uL (ref 4.0–10.5)
nRBC: 0 % (ref 0.0–0.2)

## 2019-06-04 LAB — FERRITIN: Ferritin: 268 ng/mL (ref 11–307)

## 2019-06-04 LAB — IRON AND TIBC
Iron: 68 ug/dL (ref 28–170)
Saturation Ratios: 27 % (ref 10.4–31.8)
TIBC: 250 ug/dL (ref 250–450)
UIBC: 182 ug/dL

## 2019-06-11 ENCOUNTER — Inpatient Hospital Stay (HOSPITAL_BASED_OUTPATIENT_CLINIC_OR_DEPARTMENT_OTHER): Payer: Medicare Other | Admitting: Nurse Practitioner

## 2019-06-11 DIAGNOSIS — D649 Anemia, unspecified: Secondary | ICD-10-CM

## 2019-06-11 NOTE — Assessment & Plan Note (Signed)
1.  Normocytic anemia: -Normocytic anemia since 2015.  CKD since 2012. -She is taking oral iron tablets daily since November 2019.  She does report constipation but is treating it with suppositories and prune juice. -Her last colonoscopy was 6 years ago in [place] with Dr. Matt Holmes, and was apparently normal per patient.  She has an appointment set up with Dr. Laural Golden in January. 2021. -She denies any easy bruising or bleeding.  She is on Plavix 75 mg daily. -Last blood transfusion of 2 units PRBC in November 2019 when the patient had an MI. -Patient was worked up and found P49 and folic acid were normal.  SPEP is negative.  Stool for occult blood x3 is negative. -Labs done on 06/04/2019 showed hemoglobin 10.0, ferritin 268, percent saturation 27.  Creatinine is stable at 1.89. -She does report severe tiredness. -I do not recommend any IV iron at this time. -We will see her back in 6 weeks with repeat labs.

## 2019-06-11 NOTE — Progress Notes (Signed)
[address] [place], [place] [address]   CLINIC:  Medical Oncology/Hematology  PCP:  Allie Dimmer, [address] [place] [address] [phone]   REASON FOR VISIT: Follow-up for normocytic anemia  CURRENT THERAPY: Intermittent iron infusions   INTERVAL HISTORY:  Joyce [name] 77 y.o. female returns for routine follow-up for normocytic anemia.  Patient reports she is doing well since her last appointment.  She still has intermittent fatigue.  She denies any bright red bleeding per rectum or melena.  She denies any easy bruising or bleeding. Denies any nausea, vomiting, or diarrhea. Denies any new pains. Had not noticed any recent bleeding such as epistaxis, hematuria or hematochezia. Denies recent chest pain on exertion, shortness of breath on minimal exertion, pre-syncopal episodes, or palpitations. Denies any numbness or tingling in hands or feet. Denies any recent fevers, infections, or recent hospitalizations. Patient reports appetite at 100% and energy level at 75%.  She is eating well maintain her weight at this time.     REVIEW OF SYSTEMS:  Review of Systems  Constitutional: Positive for fatigue.  All other systems reviewed and are negative.    PAST MEDICAL/SURGICAL HISTORY:  Past Medical History:  Diagnosis Date  . Anemia   . Arthritis   . Bilateral pleural effusion    Postoperative, status post Pleurx catheter and talc treatment - Dr. Prescott Gum  . 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 Chronic kidney disease (CKD), stage III (moderate)   . Chronic lower back pain   . Essential hypertension   . History of pneumonia   . Hyperlipidemia   . Myocardial infarction ([place])   . Neuromuscular disorder ([place])   . Type II diabetes mellitus ([place])    Past Surgical History:  Procedure Laterality Date  . ABDOMINAL HYSTERECTOMY  1980's  . ANTERIOR  CERVICAL DECOMP/DISCECTOMY FUSION  2000's  . APPENDECTOMY  1970's  . BACK SURGERY    . CHEST TUBE INSERTION Left 08/07/2014   Procedure: INSERTION PLEURAL DRAINAGE CATHETER;  Surgeon: Ivin Poot, MD;  Location: [facility];  Service: Thoracic;  Laterality: Left;  . CHEST TUBE INSERTION Right 08/12/2014   Procedure: INSERTION PLEURAL DRAINAGE CATHETER;  Surgeon: Ivin Poot, MD;  Location: [facility];  Service: Thoracic;  Laterality: Right;  . CHOLECYSTECTOMY  ~ 2005  . CORONARY ARTERY BYPASS GRAFT N/A 06/15/2014   Procedure: CORONARY ARTERY BYPASS GRAFTING (CABG);  Surgeon: Ivin Poot, MD;  Location: [facility];  Service: Open Heart Surgery;  Laterality: N/A;  . CORONARY ATHERECTOMY N/A 05/22/2018   Procedure: CORONARY ATHERECTOMY;  Surgeon: Belva Crome, MD;  Location: [facility];  Service: Cardiovascular;  Laterality: N/A;  . CORONARY STENT INTERVENTION N/A 05/22/2018   Procedure: CORONARY STENT INTERVENTION;  Surgeon: Belva Crome, MD;  Location: [facility];  Service: Cardiovascular;  Laterality: N/A;  rca   . INTRAOPERATIVE TRANSESOPHAGEAL ECHOCARDIOGRAM N/A 06/15/2014   Procedure: INTRAOPERATIVE TRANSESOPHAGEAL ECHOCARDIOGRAM;  Surgeon: Ivin Poot, MD;  Location: [facility];  Service: Open Heart Surgery;  Laterality: N/A;  . LEFT HEART CATH AND CORS/GRAFTS ANGIOGRAPHY N/A 05/20/2018   Procedure: LEFT HEART CATH AND CORS/GRAFTS ANGIOGRAPHY;  Surgeon: Lorretta Harp, MD;  Location: [facility];  Service: Cardiovascular;  Laterality: N/A;  . LEFT HEART CATHETERIZATION WITH CORONARY ANGIOGRAM N/A 06/11/2014   Procedure: LEFT  HEART CATHETERIZATION WITH CORONARY ANGIOGRAM;  Surgeon: Blane Ohara, MD;  Location[facility];  Service: Cardiovascular;  Laterality: N/A;  . LUMBAR DISC SURGERY  1980's X 1; 1990's X  2000's X 1  . REMOVAL OF PLEURAL DRAINAGE CATHETER Bilateral 02/11/2015   Procedure: REMOVAL OF PLEURAL DRAINAGE CATHETER;  Surgeon: Ivin Poot, MD;   Location: [facility];  Service: Thoracic;  Laterality: Bilateral;  . TALC PLEURODESIS Bilateral 02/11/2015   Procedure: Pietro Cassis;  Surgeon: Ivin Poot, MD;  Location: [facility];  Service: Thoracic;  Laterality: Bilateral;  . TONSILLECTOMY  ~ 1950     SOCIAL HISTORY:  Social History   Socioeconomic History  . Marital status: Widowed    Spouse name: Not on file  . Number of children: 2  . Years of education: Not on file  . Highest education level: Not on file  Occupational History  . Occupation: retired  Tobacco Use  . Smoking status: Never Smoker  . Smokeless tobacco: Never Used  Substance and Sexual Activity  . Alcohol use: No    Alcohol/week: 0.0 standard drinks  . Drug use: No  . Sexual activity: Not on file  Other Topics Concern  . Not on file  Social History Narrative  . Not on file   Social Determinants of Health   Financial Resource Strain:   . Difficulty of Paying Living Expenses: Not on file  Food Insecurity:   . Worried About Charity fundraiser in the Last Year: Not on file  . Ran Out of Food in the Last Year: Not on file  Transportation Needs:   . Lack of Transportation (Medical): Not on file  . Lack of Transportation (Non-Medical): Not on file  Physical Activity:   . Days of Exercise per Week: Not on file  . Minutes of Exercise per Session: Not on file  Stress:   . Feeling of Stress : Not on file  Social Connections:   . Frequency of Communication with Friends and Family: Not on file  . Frequency of Social Gatherings with Friends and Family: Not on file  . Attends Religious Services: Not on file  . Active Member of Clubs or Organizations: Not on file  . Attends Archivist Meetings: Not on file  . Marital Status: Not on file  Intimate Partner Violence:   . Fear of Current or Ex-Partner: Not on file  . Emotionally Abused: Not on file  . Physically Abused: Not on file  . Sexually Abused: Not on file    FAMILY HISTORY:  Family History    Problem Relation Age of Onset  . Stroke Father   . Diabetes Mother   . Hypertension Mother   . Alcoholism Brother   . Gout Brother   . Arthritis Brother   . Alcoholism Brother   . Heart disease Sister   . Heart attack Sister   . Cancer Sister        hodkins lymphoma  . CAD Paternal Grandmother        heart attack  . CAD Paternal Uncle        heart attack  . Heart attack Paternal Grandfather   . Osteoporosis Sister     CURRENT MEDICATIONS:  Outpatient Encounter Medications as of 06/11/2019  Medication Sig  . ACCU-CHEK AVIVA PLUS test strip Inject 1 strip as directed 4 (four) times daily.  Marland Kitchen acetaminophen (TYLENOL) 325 MG tablet Take 650 mg by mouth every 4 (four) hours as needed.  Marland Kitchen  albuterol (VENTOLIN HFA) 108 (90 Base) MCG/ACT inhaler Inhale 2 puffs into the lungs every 4 (four) hours as needed for wheezing or shortness of breath.  Marland Kitchen amLODipine (NORVASC) 10 MG tablet Take 10 mg by mouth daily.   Marland Kitchen atorvastatin (LIPITOR) 40 MG tablet TAKE ONE TABLET BY MOUTH DAILY  . busPIRone (BUSPAR) 5 MG tablet Take 5 mg by mouth as needed. Pt is supposed to take TID but takes prn  . Cholecalciferol (VITAMIN D3) 2000 units TABS Take 2,000 Units by mouth daily.   . clopidogrel (PLAVIX) 75 MG tablet TAKE ONE TABLET BY MOUTH DAILY WITH BREAKFAST  . ferrous sulfate 325 (65 FE) MG tablet TAKE ONE TABLET BY MOUTH DAILY  . fluticasone (FLONASE) 50 MCG/ACT nasal spray Place 1 spray into both nostrils daily.   . furosemide (LASIX) 40 MG tablet Take 40 mg by mouth daily.  Marland Kitchen GLUCERNA (GLUCERNA) LIQD Take 237 mLs by mouth daily.  . hydrALAZINE (APRESOLINE) 100 MG tablet Take 100 mg by mouth 3 (three) times daily.  . insulin glargine (LANTUS) 100 UNIT/ML injection Inject 40 Units into the skin at bedtime.   . insulin lispro (HUMALOG) 100 UNIT/ML KiwkPen Inject 8 Units into the skin 3 (three) times daily with meals.   . Insulin Syringe-Needle U-100 (INSULIN SYRINGE .5CC/30GX5/16") 30G X 5/16" 0.5 ML MISC  Inject 0.5 mLs as directed 3 (three) times daily.  . isosorbide mononitrate (IMDUR) 60 MG 24 hr tablet Take 1 tablet (60 mg total) by mouth daily.  Marland Kitchen lisinopril (PRINIVIL,ZESTRIL) 5 MG tablet Take 5 mg by mouth daily.   . nitroGLYCERIN (NITROSTAT) 0.4 MG SL tablet Place 1 tablet (0.4 mg total) under the tongue every 5 (five) minutes x 3 doses as needed for chest pain.  . polyethylene glycol (MIRALAX / GLYCOLAX) packet Take 17 g by mouth daily as needed for mild constipation.  . primidone (MYSOLINE) 50 MG tablet Take 50 mg by mouth daily.   . Tiotropium Bromide-Olodaterol (STIOLTO RESPIMAT) 2.5-2.5 MCG/ACT AERS Take 2 puffs by mouth daily.  . traMADol (ULTRAM) 50 MG tablet TAKE TWO TABLETS BY MOUTH EVERY 8 HOURS AS NEEDED FOR PAIN  . vitamin C (ASCORBIC ACID) 500 MG tablet Take 500 mg by mouth daily.   No facility-administered encounter medications on file as of 06/11/2019.    ALLERGIES:  Allergies  Allergen Reactions  . Carvedilol Other (See Comments)    confusion  . Codeine Other (See Comments)    confusion  . Metoprolol Palpitations    Vital signs: -Deferred due to telephone visit.  Physical Exam -Deferred due to telephone visit. -Patient was alert and oriented over the phone and in no acute distress.  LABORATORY DATA:  I have reviewed the labs as listed.  CBC    Component Value Date/Time   WBC 5.0 06/04/2019 1309   RBC 3.32 (L) 06/04/2019 1309   HGB 10.0 (L) 06/04/2019 1309   HCT 30.9 (L) 06/04/2019 1309   PLT 162 06/04/2019 1309   MCV 93.1 06/04/2019 1309   MCH 30.1 06/04/2019 1309   MCHC 32.4 06/04/2019 1309   RDW 13.0 06/04/2019 1309   LYMPHSABS 1.4 06/04/2019 1309   MONOABS 0.5 06/04/2019 1309   EOSABS 0.1 06/04/2019 1309   BASOSABS 0.0 06/04/2019 1309   CMP Latest Ref Rng & Units 06/04/2019 01/30/2019 05/29/2018  Glucose 70 - 99 mg/dL 103(H) 78 128(H)  BUN 8 - 23 mg/dL 41(H) 37(H) 36(H)  Creatinine 0.44 - 1.00 mg/dL 1.89(H) 1.86(H) 1.76(H)  Sodium 135 -  145  mmol/L 136 134(L) 138  Potassium 3.5 - 5.1 mmol/L 4.9 4.6 5.1  Chloride 98 - 111 mmol/L 103 103 108  CO2 22 - 32 mmol/L 21(L) 27 20(L)  Calcium 8.9 - 10.3 mg/dL 8.9 8.7(L) 8.9  Total Protein 6.5 - 8.1 g/dL 6.6 6.5 -  Total Bilirubin 0.3 - 1.2 mg/dL 0.6 0.5 -  Alkaline Phos 38 - 126 U/L 55 54 -  AST 15 - 41 U/L 23 16 -  ALT 0 - 44 U/L 19 15 -    All questions were answered to patient's stated satisfaction. Encouraged patient to call with any new concerns or questions before his next visit to the cancer center and we can certain see him sooner, if needed.      ASSESSMENT & PLAN:   Normocytic anemia 1.  Normocytic anemia: -Normocytic anemia since 2015.  CKD since 2012. -She is taking oral iron tablets daily since November 2019.  She does report constipation but is treating it with suppositories and prune juice. -Her last colonoscopy was 6 years ago in [place] with Dr. Matt Holmes, and was apparently normal per patient.  She has an appointment set up with Dr. Laural Golden in January. 2021. -She denies any easy bruising or bleeding.  She is on Plavix 75 mg daily. -Last blood transfusion of 2 units PRBC in November 2019 when the patient had an MI. -Patient was worked up and found Z60 and folic acid were normal.  SPEP is negative.  Stool for occult blood x3 is negative. -Labs done on 06/04/2019 showed hemoglobin 10.0, ferritin 268, percent saturation 27.  Creatinine is stable at 1.89. -She does report severe tiredness. -I do not recommend any IV iron at this time. -We will see her back in 6 weeks with repeat labs.      Orders placed this encounter:  No orders of the defined types were placed in this encounter.     Francene Finders, FNP-C [facility] [phone]

## 2019-07-16 ENCOUNTER — Ambulatory Visit (INDEPENDENT_AMBULATORY_CARE_PROVIDER_SITE_OTHER): Payer: Medicare Other | Admitting: Gastroenterology

## 2019-07-16 ENCOUNTER — Encounter (INDEPENDENT_AMBULATORY_CARE_PROVIDER_SITE_OTHER): Payer: Self-pay | Admitting: Gastroenterology

## 2019-07-16 ENCOUNTER — Other Ambulatory Visit: Payer: Self-pay

## 2019-07-16 VITALS — BP 168/73 | HR 77 | Temp 97.3°F | Ht 65.0 in | Wt 201.1 lb

## 2019-07-16 DIAGNOSIS — Z7902 Long term (current) use of antithrombotics/antiplatelets: Secondary | ICD-10-CM | POA: Diagnosis not present

## 2019-07-16 DIAGNOSIS — D509 Iron deficiency anemia, unspecified: Secondary | ICD-10-CM | POA: Diagnosis not present

## 2019-07-16 DIAGNOSIS — K5900 Constipation, unspecified: Secondary | ICD-10-CM | POA: Diagnosis not present

## 2019-07-16 NOTE — Patient Instructions (Signed)
Try miralax 17 gram daily - try for 1-2 weeks, if no improvement in constipation please call and will recommend Linzess (prescription for constipation). Continue your good intake of fiber and fluid   -We will schedule endoscopy and colonoscopy, will need to verify can hold plavix prior

## 2019-07-16 NOTE — Progress Notes (Signed)
Patient profile: Joyce [name] is a 79 y.o. female seen for evaluation of constipation . Referred by Dr. Jenean Lindau  History of Present Illness: Joyce [name] is seen today for constipation-she reports this began approximately 3 months ago.  She denies any medication changes at that time or diet changes.  Initially she was using prune juice or MiraLAX as needed which gave relief at first.  She feels that over the past 5 weeks it has worsened and she has had to be seen in the ER for constipation pain.  She reports her baseline bowel habits were mild constipation with a bowel movement every 3 to 4 days.  Now she is currently going a week between stools and when does have bowel movement is a small amount with incomplete emptying.  She denies any blood in stool.  Chronically has dark stools due to iron therapy, did try decreasing iron to every other day which did not improve constipation.  She feels bloated and swollen due to the constipation but no other abd pain.  She drinks prune juice nightly as well as takes a probiotic and a fiber gummy.  She does try to get 64 ounces of water a day.  She denies any nausea or vomiting, occasionally gets queasy if she eats greasy foods but no GERD or dysphagia.  Appetite good.  Daughter accompanies helps with history.    Wt Readings from Last 3 Encounters:  07/16/19 201 lb 1.6 oz (91.2 kg)  04/16/19 199 lb 3.2 oz (90.4 kg)  03/13/19 201 lb 11.2 oz (91.5 kg)     Last Colonoscopy: Per patient approximately 5 to 6 years ago-record release sent Last Endoscopy: none prior    Past Medical History:  Past Medical History:  Diagnosis Date  . Anemia   . Arthritis   . Bilateral pleural effusion    Postoperative, status post Pleurx catheter and talc treatment - Dr. Prescott Gum  . 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 Chronic kidney disease (CKD), stage III (moderate)    . Chronic lower back pain   . Essential hypertension   . History of pneumonia   . Hyperlipidemia   . Myocardial infarction ([place])   . Neuromuscular disorder ([place])   . Type II diabetes mellitus ([place])     Problem List: Patient Active Problem List   Diagnosis Date Noted  . 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 Acute on chronic blood loss anemia 05/23/2018  . Coronary artery disease involving native coronary artery of native heart with unstable angina pectoris ([place]) 05/23/2018  . S/P right coronary artery (RCA) stent placement 05/23/2018  . 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 Acute bacterial rhinosinusitis 08/13/2014  . Bilateral pleural effusion 08/04/2014  . Dyspnea   . Leukopenia 07/28/2014  . Normocytic anemia 07/27/2014  . Exudative pleural effusion   . Diastolic CHF, chronic ([place]) 07/26/2014  . 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 NSTEMI (non-ST elevated myocardial infarction) ([place]) 06/11/2014  . Chronic kidney disease (CKD), stage III (moderate) ([place]) 06/11/2014  . Essential hypertension 06/11/2014  . Diabetes mellitus type 2 in obese ([place]) 06/11/2014    Past Surgical History: Past Surgical History:  Procedure Laterality Date  . ABDOMINAL HYSTERECTOMY  1980's  . ANTERIOR CERVICAL DECOMP/DISCECTOMY FUSION  2000's  . APPENDECTOMY  1970's  . BACK SURGERY    . CHEST TUBE  INSERTION Left 08/07/2014   Procedure: INSERTION PLEURAL DRAINAGE CATHETER;  Surgeon: Ivin Poot, MD;  Location: [facility];  Service: Thoracic;  Laterality: Left;  . CHEST TUBE INSERTION Right 08/12/2014   Procedure: INSERTION PLEURAL DRAINAGE CATHETER;  Surgeon: Ivin Poot, MD;  Location: [facility];  Service: Thoracic;  Laterality: Right;  . CHOLECYSTECTOMY  ~ 2005  . CORONARY ARTERY BYPASS GRAFT N/A 06/15/2014   Procedure: CORONARY ARTERY BYPASS GRAFTING (CABG);  Surgeon: Ivin Poot, MD;  Location: [facility];  Service: Open Heart Surgery;  Laterality: N/A;  . CORONARY ATHERECTOMY N/A 05/22/2018    Procedure: CORONARY ATHERECTOMY;  Surgeon: Belva Crome, MD;  Location: [facility];  Service: Cardiovascular;  Laterality: N/A;  . CORONARY STENT INTERVENTION N/A 05/22/2018   Procedure: CORONARY STENT INTERVENTION;  Surgeon: Belva Crome, MD;  Location: [facility];  Service: Cardiovascular;  Laterality: N/A;  rca   . INTRAOPERATIVE TRANSESOPHAGEAL ECHOCARDIOGRAM N/A 06/15/2014   Procedure: INTRAOPERATIVE TRANSESOPHAGEAL ECHOCARDIOGRAM;  Surgeon: Ivin Poot, MD;  Location: [facility];  Service: Open Heart Surgery;  Laterality: N/A;  . LEFT HEART CATH AND CORS/GRAFTS ANGIOGRAPHY N/A 05/20/2018   Procedure: LEFT HEART CATH AND CORS/GRAFTS ANGIOGRAPHY;  Surgeon: Lorretta Harp, MD;  Location: [facility];  Service: Cardiovascular;  Laterality: N/A;  . LEFT HEART CATHETERIZATION WITH CORONARY ANGIOGRAM N/A 06/11/2014   Procedure: LEFT HEART CATHETERIZATION WITH CORONARY ANGIOGRAM;  Surgeon: Blane Ohara, MD;  [facility];  Service: Cardiovascular;  Laterality: N/A;  . LUMBAR DISC SURGERY  1980's X 1; 1990's X  2000's X 1  . REMOVAL OF PLEURAL DRAINAGE CATHETER Bilateral 02/11/2015   Procedure: REMOVAL OF PLEURAL DRAINAGE CATHETER;  Surgeon: Ivin Poot, MD;  Location: [facility];  Service: Thoracic;  Laterality: Bilateral;  . TALC PLEURODESIS Bilateral 02/11/2015   Procedure: Pietro Cassis;  Surgeon: Ivin Poot, MD;  Location: [facility];  Service: Thoracic;  Laterality: Bilateral;  . TONSILLECTOMY  ~ 1950    Allergies: Allergies  Allergen Reactions  . Carvedilol Other (See Comments)    confusion  . Codeine Other (See Comments)    confusion  . Metoprolol Palpitations      Home Medications:  Current Outpatient Medications:  .  ACCU-CHEK AVIVA PLUS test strip, Inject 1 strip as directed 4 (four) times daily., Disp: , Rfl: 2 .  acetaminophen (TYLENOL) 325 MG tablet, Take 650 mg by mouth every 4 (four) hours as needed., Disp: , Rfl:  .  albuterol  (VENTOLIN HFA) 108 (90 Base) MCG/ACT inhaler, Inhale 2 puffs into the lungs every 4 (four) hours as needed for wheezing or shortness of breath., Disp: 8 g, Rfl: 5 .  amLODipine (NORVASC) 10 MG tablet, Take 10 mg by mouth daily. , Disp: , Rfl: 5 .  aspirin EC 81 MG tablet, Take 81 mg by mouth daily., Disp: , Rfl:  .  atorvastatin (LIPITOR) 40 MG tablet, TAKE ONE TABLET BY MOUTH DAILY, Disp: , Rfl:  .  busPIRone (BUSPAR) 5 MG tablet, Take 5 mg by mouth as needed. Pt is supposed to take TID but takes prn, Disp: , Rfl:  .  Calcium Carbonate-Vit D-Min (CALCIUM 1200 PO), Take 1,200 mg by mouth daily., Disp: , Rfl:  .  Cholecalciferol (VITAMIN D3) 2000 units TABS, Take 2,000 Units by mouth daily. , Disp: , Rfl:  .  clopidogrel (PLAVIX) 75 MG tablet, TAKE ONE TABLET BY MOUTH DAILY WITH BREAKFAST, Disp: 90 tablet, Rfl:  1 .  ferrous sulfate 325 (65 FE) MG tablet, TAKE ONE TABLET BY MOUTH DAILY, Disp: 60 tablet, Rfl: 6 .  FIBER ADULT GUMMIES PO, Take by mouth. Patient is taking 2 a day, Disp: , Rfl:  .  fluticasone (FLONASE) 50 MCG/ACT nasal spray, Place 1 spray into both nostrils daily. , Disp: , Rfl:  .  folic acid-vitamin b complex-vitamin c-selenium-zinc (DIALYVITE) 3 MG TABS tablet, Take 1 tablet by mouth daily., Disp: , Rfl:  .  furosemide (LASIX) 40 MG tablet, Take 40 mg by mouth daily., Disp: , Rfl:  .  GLUCERNA (GLUCERNA) LIQD, Take 237 mLs by mouth daily., Disp: , Rfl:  .  hydrALAZINE (APRESOLINE) 100 MG tablet, Take 100 mg by mouth 3 (three) times daily., Disp: , Rfl:  .  insulin glargine (LANTUS) 100 UNIT/ML injection, Inject 40 Units into the skin at bedtime. , Disp: , Rfl:  .  insulin lispro (HUMALOG) 100 UNIT/ML KiwkPen, Inject 8 Units into the skin 3 (three) times daily with meals. , Disp: , Rfl:  .  Insulin Syringe-Needle U-100 (INSULIN SYRINGE .5CC/30GX5/16") 30G X 5/16" 0.5 ML MISC, Inject 0.5 mLs as directed 3 (three) times daily., Disp: , Rfl: 3 .  lisinopril (PRINIVIL,ZESTRIL) 5 MG  tablet, Take 5 mg by mouth daily. , Disp: , Rfl:  .  montelukast (SINGULAIR) 10 MG tablet, Take 10 mg by mouth at bedtime., Disp: , Rfl:  .  Multiple Vitamins-Minerals (MULTIVITAMIN ADULTS PO), Take by mouth daily., Disp: , Rfl:  .  naloxone (NARCAN) nasal spray 4 mg/0.1 mL, Place 1 spray into the nose. Use as directed., Disp: , Rfl:  .  nitroGLYCERIN (NITROSTAT) 0.4 MG SL tablet, Place 1 tablet (0.4 mg total) under the tongue every 5 (five) minutes x 3 doses as needed for chest pain., Disp: 25 tablet, Rfl: 3 .  polyethylene glycol (MIRALAX / GLYCOLAX) packet, Take 17 g by mouth daily as needed for mild constipation., Disp: 14 each, Rfl: 2 .  primidone (MYSOLINE) 50 MG tablet, Take 50 mg by mouth daily. , Disp: , Rfl: 5 .  Probiotic Product (PROBIOTIC DAILY PO), Take by mouth daily., Disp: , Rfl:  .  Tiotropium Bromide-Olodaterol (STIOLTO RESPIMAT) 2.5-2.5 MCG/ACT AERS, Take 2 puffs by mouth daily., Disp: 4 g, Rfl: 11 .  traMADol (ULTRAM) 50 MG tablet, TAKE TWO TABLETS BY MOUTH EVERY 8 HOURS AS NEEDED FOR PAIN, Disp: , Rfl:  .  vitamin C (ASCORBIC ACID) 500 MG tablet, Take 500 mg by mouth daily., Disp: , Rfl:  .  isosorbide mononitrate (IMDUR) 60 MG 24 hr tablet, Take 1 tablet (60 mg total) by mouth daily., Disp: 90 tablet, Rfl: 3   Family History: family history includes Alcoholism in her brother and brother; Arthritis in her brother; CAD in her paternal grandmother and paternal uncle; Cancer in her sister; Diabetes in her mother; Gout in her brother; Heart attack in her paternal grandfather and sister; Heart disease in her sister; Hypertension in her mother; Osteoporosis in her sister; Stroke in her father.    Social History:   reports that she has never smoked. She has never used smokeless tobacco. She reports that she does not drink alcohol or use drugs.   Review of Systems: Constitutional: Denies weight loss/weight gain  Eyes: No changes in vision. ENT: No oral lesions, sore throat.  GI:  see HPI.  Heme/Lymph: No easy bruising.  CV: No chest pain.  GU: No hematuria.  Integumentary: No rashes.  Neuro: No headaches.  Psych: No depression/anxiety.  Endocrine: No heat/cold intolerance.  Allergic/Immunologic: No urticaria.  Resp: No cough, SOB.  Musculoskeletal: No joint swelling.    Physical Examination: BP (!) 168/73 (BP Location: Right Arm, Patient Position: Sitting, Cuff Size: Large)   Pulse 77   Temp (!) 97.3 F (36.3 C) (Temporal)   Ht 5\' 5"  (1.651 m)   Wt 201 lb 1.6 oz (91.2 kg)   BMI 33.46 kg/m  Gen: NAD, alert and oriented x 4 HEENT: PEERLA, EOMI, Neck: supple, no JVD Chest: CTA bilaterally, no wheezes, crackles, or other adventitious sounds CV: RRR, no m/g/c/r Abd: soft, NT, ND, +BS in all four quadrants; no HSM, guarding, ridigity, or rebound tenderness Ext: no edema, well perfused with 2+ pulses, Skin: no rash or lesions noted on observed skin Lymph: no noted LAD  Data: 05/2019--ferritin 268, iron/tib panel normal, CMP w/ Cr 1.89, GFR 25, CBC w/ Hgb 10, MCV 93  01/2019-occult negative x 3   Assessment/Plan: Ms. Ferrentino is a 79 y.o. female  Shernita was seen today for new patient (initial visit).  Diagnoses and all orders for this visit:  Constipation, unspecified constipation type  Iron deficiency anemia, unspecified iron deficiency anemia type  Antiplatelet or antithrombotic long-term use      1.  Constipation-chronic but recently worsening, tried decreasing iron to supplement every other day without improvement.  She has tried MiraLAX as needed, we discussed the routine use of MiraLAX initially.  If she does not improve with routine MiraLAX daily will increase to Linzess.  She will call if she would like Linzess.  Did review needs to be moving stools more regularly prior to colonoscopy prep to decrease the risk of inadequate prep.   She had a CT at [facility] for eval of constipation-results requested.   2.  IDA-she has been  Hemoccult negative in August 2020.  Normocytic. she has had IV iron in the past but not needed in past 3 months  She is on oral iron every other day.  She is followed by hematology.  We will add on a endoscopy which she has never had before to her colonoscopy to exclude upper GI bleed  Patient on Plavix-will verify can hold x 5 days prior, on since stent placement Nov 2019. Order for EGD/colonoscopy to be placed when able to schedule w/ covid restrictions.  Patient denies CP, SOB. I discussed the risks and benefits of procedure including bleeding, perforation, infection, missed lesions, medication reactions and possible hospitalization or surgery if complications. All questions answered.     I personally performed the service, non-incident to. (WP)  Laurine Blazer, [facility]

## 2019-07-31 ENCOUNTER — Other Ambulatory Visit (HOSPITAL_COMMUNITY): Payer: Self-pay | Admitting: Emergency Medicine

## 2019-07-31 DIAGNOSIS — D649 Anemia, unspecified: Secondary | ICD-10-CM

## 2019-08-01 ENCOUNTER — Inpatient Hospital Stay (HOSPITAL_COMMUNITY): Payer: Medicare Other | Attending: Hematology

## 2019-08-01 ENCOUNTER — Other Ambulatory Visit: Payer: Self-pay

## 2019-08-01 DIAGNOSIS — N189 Chronic kidney disease, unspecified: Secondary | ICD-10-CM | POA: Diagnosis not present

## 2019-08-01 DIAGNOSIS — K5900 Constipation, unspecified: Secondary | ICD-10-CM | POA: Diagnosis not present

## 2019-08-01 DIAGNOSIS — Z7982 Long term (current) use of aspirin: Secondary | ICD-10-CM | POA: Insufficient documentation

## 2019-08-01 DIAGNOSIS — Z7902 Long term (current) use of antithrombotics/antiplatelets: Secondary | ICD-10-CM | POA: Insufficient documentation

## 2019-08-01 DIAGNOSIS — D649 Anemia, unspecified: Secondary | ICD-10-CM | POA: Insufficient documentation

## 2019-08-01 LAB — COMPREHENSIVE METABOLIC PANEL
ALT: 16 U/L (ref 0–44)
AST: 18 U/L (ref 15–41)
Albumin: 3.6 g/dL (ref 3.5–5.0)
Alkaline Phosphatase: 58 U/L (ref 38–126)
Anion gap: 8 (ref 5–15)
BUN: 34 mg/dL — ABNORMAL HIGH (ref 8–23)
CO2: 23 mmol/L (ref 22–32)
Calcium: 8.5 mg/dL — ABNORMAL LOW (ref 8.9–10.3)
Chloride: 101 mmol/L (ref 98–111)
Creatinine, Ser: 1.98 mg/dL — ABNORMAL HIGH (ref 0.44–1.00)
GFR calc Af Amer: 28 mL/min — ABNORMAL LOW (ref >60)
GFR calc non Af Amer: 24 mL/min — ABNORMAL LOW (ref >60)
Glucose, Bld: 97 mg/dL (ref 70–99)
Potassium: 4.9 mmol/L (ref 3.5–5.1)
Sodium: 132 mmol/L — ABNORMAL LOW (ref 135–145)
Total Bilirubin: 0.6 mg/dL (ref 0.3–1.2)
Total Protein: 6 g/dL — ABNORMAL LOW (ref 6.5–8.1)

## 2019-08-01 LAB — CBC WITH DIFFERENTIAL/PLATELET
Abs Immature Granulocytes: 0.01 10*3/uL (ref 0.00–0.07)
Basophils Absolute: 0 10*3/uL (ref 0.0–0.1)
Basophils Relative: 0 %
Eosinophils Absolute: 0.1 10*3/uL (ref 0.0–0.5)
Eosinophils Relative: 1 %
HCT: 28.9 % — ABNORMAL LOW (ref 36.0–46.0)
Hemoglobin: 9.6 g/dL — ABNORMAL LOW (ref 12.0–15.0)
Immature Granulocytes: 0 %
Lymphocytes Relative: 32 %
Lymphs Abs: 1.6 10*3/uL (ref 0.7–4.0)
MCH: 30.3 pg (ref 26.0–34.0)
MCHC: 33.2 g/dL (ref 30.0–36.0)
MCV: 91.2 fL (ref 80.0–100.0)
Monocytes Absolute: 0.4 10*3/uL (ref 0.1–1.0)
Monocytes Relative: 8 %
Neutro Abs: 2.8 10*3/uL (ref 1.7–7.7)
Neutrophils Relative %: 59 %
Platelets: 173 10*3/uL (ref 150–400)
RBC: 3.17 MIL/uL — ABNORMAL LOW (ref 3.87–5.11)
RDW: 12.3 % (ref 11.5–15.5)
WBC: 4.8 10*3/uL (ref 4.0–10.5)
nRBC: 0 % (ref 0.0–0.2)

## 2019-08-01 LAB — FERRITIN: Ferritin: 294 ng/mL (ref 11–307)

## 2019-08-01 LAB — IRON AND TIBC
Iron: 83 ug/dL (ref 28–170)
Saturation Ratios: 33 % — ABNORMAL HIGH (ref 10.4–31.8)
TIBC: 253 ug/dL (ref 250–450)
UIBC: 170 ug/dL

## 2019-08-05 ENCOUNTER — Telehealth (INDEPENDENT_AMBULATORY_CARE_PROVIDER_SITE_OTHER): Payer: Self-pay | Admitting: Internal Medicine

## 2019-08-05 ENCOUNTER — Encounter (INDEPENDENT_AMBULATORY_CARE_PROVIDER_SITE_OTHER): Payer: Self-pay | Admitting: *Deleted

## 2019-08-05 ENCOUNTER — Telehealth (INDEPENDENT_AMBULATORY_CARE_PROVIDER_SITE_OTHER): Payer: Self-pay | Admitting: *Deleted

## 2019-08-05 ENCOUNTER — Other Ambulatory Visit (INDEPENDENT_AMBULATORY_CARE_PROVIDER_SITE_OTHER): Payer: Self-pay | Admitting: *Deleted

## 2019-08-05 DIAGNOSIS — D509 Iron deficiency anemia, unspecified: Secondary | ICD-10-CM

## 2019-08-05 MED ORDER — LINACLOTIDE 145 MCG PO CAPS: 145.0000 ug | ORAL_CAPSULE | Freq: Every day | ORAL | 3 refills | Status: DC

## 2019-08-05 MED ORDER — PLENVU 140 G PO SOLR: 1.0000 | Freq: Once | ORAL | 0 refills | Status: AC

## 2019-08-05 NOTE — Telephone Encounter (Signed)
Please notify patient if miralax was not working I sent linzess to pharmacy to try -s take once a day 30 min before breakfast. This is stronger than miralax and prescription strength. She should call if has any issues.

## 2019-08-05 NOTE — Telephone Encounter (Signed)
Patient needs plenvu (copay card) TCS/EGD sch'd 3/4

## 2019-08-05 NOTE — Telephone Encounter (Signed)
Spoke to patient, gave Janice's recommendations

## 2019-08-05 NOTE — Telephone Encounter (Signed)
Patient aware.

## 2019-08-05 NOTE — Telephone Encounter (Signed)
Patient schedule for colonoscopy/endoscopy 08/28/19 - she needs to stop Plavix 5 days prior - please advise if ok to stop, thanks

## 2019-08-05 NOTE — Telephone Encounter (Signed)
She may hold ASA and Plavix as requested.

## 2019-08-05 NOTE — Telephone Encounter (Signed)
Patient called stated she has been taking Miralax and is still constipated - please advise - [phone]

## 2019-08-06 ENCOUNTER — Encounter (HOSPITAL_COMMUNITY): Payer: Self-pay | Admitting: Hematology

## 2019-08-06 ENCOUNTER — Inpatient Hospital Stay (HOSPITAL_BASED_OUTPATIENT_CLINIC_OR_DEPARTMENT_OTHER): Payer: Medicare Other | Admitting: Hematology

## 2019-08-06 DIAGNOSIS — D649 Anemia, unspecified: Secondary | ICD-10-CM | POA: Diagnosis not present

## 2019-08-06 NOTE — Progress Notes (Signed)
Virtual Visit via Telephone Note  I connected with Joyce [name] on 08/06/19 at  4:05 PM EST by telephone and verified that I am speaking with the correct person using two identifiers.   I discussed the limitations, risks, security and privacy concerns of performing an evaluation and management service by telephone and the availability of in person appointments. I also discussed with the patient that there may be a patient responsible charge related to this service. The patient expressed understanding and agreed to proceed.   History of Present Illness: She is followed in the clinic for normocytic anemia since 2015, thought to be secondary to CKD.  She is currently taking iron tablet every other day.  She is also on aspirin and Plavix.  Stool for occult blood in August was negative.  Last colonoscopy was 6 years ago in [place] by Dr. Matt Holmes, apparently normal.   Observations/Objective: Denies any bleeding per rectum or melena.  Reports severe constipation for the last 6 months, worse particularly in the last 1 to 2 months.  Denies fevers, night sweats or weight loss.  She is currently taking iron tablet every other day.  She has been on iron pills for the last 2 years and has not experienced any constipation before the last 6 months.  Denies any new onset bone pains.  Assessment and Plan:  1.  Normocytic anemia: -CBC on 08/01/2019 showed hemoglobin 9.6, hematocrit 28.9.  Platelet count is 173 and white count is 4.8. -We reviewed ferritin which was not 294 and percent saturation is 33.  She has chronic kidney disease with creatinine baseline around 1.9-2. -She is having EGD and colonoscopy by Dr.Rehman on 09/01/2019. -She is currently on aspirin and Plavix which she will stop 7 days prior to procedure. -She will continue iron tablet every other day. -I have recommended checking CBC, reticulocyte count, LDH, serum EPO level, F79, folic acid, methylmalonic acid, ferritin and iron panel on the  same day as her endoscopy. -I will talk to her after 4 to 5 days to review the results.   Follow Up Instructions: RTC 1 week after colonoscopy.   I discussed the assessment and treatment plan with the patient. The patient was provided an opportunity to ask questions and all were answered. The patient agreed with the plan and demonstrated an understanding of the instructions.   The patient was advised to call back or seek an in-person evaluation if the symptoms worsen or if the condition fails to improve as anticipated.  I provided 12 minutes of non-face-to-face time during this encounter.   Derek Jack, MD

## 2019-08-07 ENCOUNTER — Other Ambulatory Visit (HOSPITAL_COMMUNITY): Payer: Self-pay | Admitting: *Deleted

## 2019-08-07 DIAGNOSIS — D649 Anemia, unspecified: Secondary | ICD-10-CM

## 2019-08-11 ENCOUNTER — Other Ambulatory Visit (INDEPENDENT_AMBULATORY_CARE_PROVIDER_SITE_OTHER): Payer: Self-pay | Admitting: *Deleted

## 2019-08-26 ENCOUNTER — Other Ambulatory Visit (HOSPITAL_COMMUNITY)
Admission: RE | Admit: 2019-08-26 | Discharge: 2019-08-26 | Disposition: A | Discharge status: Home / Self Care | Payer: Medicare Other | Source: Ambulatory Visit | Attending: Internal Medicine | Admitting: Internal Medicine

## 2019-08-26 ENCOUNTER — Other Ambulatory Visit: Payer: Self-pay

## 2019-08-26 DIAGNOSIS — D649 Anemia, unspecified: Secondary | ICD-10-CM

## 2019-08-26 DIAGNOSIS — Z01812 Encounter for preprocedural laboratory examination: Secondary | ICD-10-CM | POA: Insufficient documentation

## 2019-08-26 DIAGNOSIS — Z20822 Contact with and (suspected) exposure to covid-19: Secondary | ICD-10-CM | POA: Insufficient documentation

## 2019-08-26 LAB — SARS CORONAVIRUS 2 (TAT 6-24 HRS): SARS Coronavirus 2: NEGATIVE

## 2019-08-28 ENCOUNTER — Encounter (HOSPITAL_COMMUNITY)
Admission: RE | Disposition: A | Discharge status: Home / Self Care | Payer: Self-pay | Source: Home / Self Care | Attending: Internal Medicine

## 2019-08-28 ENCOUNTER — Other Ambulatory Visit: Payer: Self-pay

## 2019-08-28 ENCOUNTER — Encounter (HOSPITAL_COMMUNITY): Payer: Self-pay | Admitting: Internal Medicine

## 2019-08-28 ENCOUNTER — Ambulatory Visit (HOSPITAL_COMMUNITY)
Admission: RE | Admit: 2019-08-28 | Discharge: 2019-08-28 | Disposition: A | Discharge status: Home / Self Care | Payer: Medicare Other | Attending: Internal Medicine | Admitting: Internal Medicine

## 2019-08-28 DIAGNOSIS — Z79899 Other long term (current) drug therapy: Secondary | ICD-10-CM | POA: Insufficient documentation

## 2019-08-28 DIAGNOSIS — D123 Benign neoplasm of transverse colon: Secondary | ICD-10-CM

## 2019-08-28 DIAGNOSIS — K6289 Other specified diseases of anus and rectum: Secondary | ICD-10-CM | POA: Insufficient documentation

## 2019-08-28 DIAGNOSIS — K5989 Other specified functional intestinal disorders: Secondary | ICD-10-CM | POA: Insufficient documentation

## 2019-08-28 DIAGNOSIS — M797 Fibromyalgia: Secondary | ICD-10-CM | POA: Diagnosis not present

## 2019-08-28 DIAGNOSIS — Z7902 Long term (current) use of antithrombotics/antiplatelets: Secondary | ICD-10-CM | POA: Diagnosis not present

## 2019-08-28 DIAGNOSIS — I2510 Atherosclerotic heart disease of native coronary artery without angina pectoris: Secondary | ICD-10-CM | POA: Insufficient documentation

## 2019-08-28 DIAGNOSIS — I129 Hypertensive chronic kidney disease with stage 1 through stage 4 chronic kidney disease, or unspecified chronic kidney disease: Secondary | ICD-10-CM | POA: Diagnosis not present

## 2019-08-28 DIAGNOSIS — G8929 Other chronic pain: Secondary | ICD-10-CM | POA: Diagnosis not present

## 2019-08-28 DIAGNOSIS — Z951 Presence of aortocoronary bypass graft: Secondary | ICD-10-CM | POA: Diagnosis not present

## 2019-08-28 DIAGNOSIS — E785 Hyperlipidemia, unspecified: Secondary | ICD-10-CM | POA: Diagnosis not present

## 2019-08-28 DIAGNOSIS — D649 Anemia, unspecified: Secondary | ICD-10-CM | POA: Insufficient documentation

## 2019-08-28 DIAGNOSIS — K5900 Constipation, unspecified: Secondary | ICD-10-CM | POA: Insufficient documentation

## 2019-08-28 DIAGNOSIS — K644 Residual hemorrhoidal skin tags: Secondary | ICD-10-CM

## 2019-08-28 DIAGNOSIS — Z955 Presence of coronary angioplasty implant and graft: Secondary | ICD-10-CM | POA: Diagnosis not present

## 2019-08-28 DIAGNOSIS — K449 Diaphragmatic hernia without obstruction or gangrene: Secondary | ICD-10-CM | POA: Insufficient documentation

## 2019-08-28 DIAGNOSIS — M545 Low back pain: Secondary | ICD-10-CM | POA: Diagnosis not present

## 2019-08-28 DIAGNOSIS — N183 Chronic kidney disease, stage 3 unspecified: Secondary | ICD-10-CM | POA: Insufficient documentation

## 2019-08-28 DIAGNOSIS — Z794 Long term (current) use of insulin: Secondary | ICD-10-CM | POA: Insufficient documentation

## 2019-08-28 DIAGNOSIS — D509 Iron deficiency anemia, unspecified: Secondary | ICD-10-CM | POA: Diagnosis present

## 2019-08-28 DIAGNOSIS — I252 Old myocardial infarction: Secondary | ICD-10-CM | POA: Diagnosis not present

## 2019-08-28 DIAGNOSIS — E1122 Type 2 diabetes mellitus with diabetic chronic kidney disease: Secondary | ICD-10-CM | POA: Diagnosis not present

## 2019-08-28 HISTORY — PX: BIOPSY: SHX5522

## 2019-08-28 HISTORY — PX: POLYPECTOMY: SHX5525

## 2019-08-28 HISTORY — PX: COLONOSCOPY: SHX5424

## 2019-08-28 HISTORY — PX: ESOPHAGOGASTRODUODENOSCOPY: SHX5428

## 2019-08-28 LAB — CBC
HCT: 29.6 % — ABNORMAL LOW (ref 36.0–46.0)
Hemoglobin: 9.8 g/dL — ABNORMAL LOW (ref 12.0–15.0)
MCH: 30.4 pg (ref 26.0–34.0)
MCHC: 33.1 g/dL (ref 30.0–36.0)
MCV: 91.9 fL (ref 80.0–100.0)
Platelets: 146 10*3/uL — ABNORMAL LOW (ref 150–400)
RBC: 3.22 MIL/uL — ABNORMAL LOW (ref 3.87–5.11)
RDW: 12.6 % (ref 11.5–15.5)
WBC: 3.6 10*3/uL — ABNORMAL LOW (ref 4.0–10.5)
nRBC: 0 % (ref 0.0–0.2)

## 2019-08-28 LAB — IRON AND TIBC
Iron: 71 ug/dL (ref 28–170)
Saturation Ratios: 29 % (ref 10.4–31.8)
TIBC: 241 ug/dL — ABNORMAL LOW (ref 250–450)
UIBC: 170 ug/dL

## 2019-08-28 LAB — LACTATE DEHYDROGENASE: LDH: 141 U/L (ref 98–192)

## 2019-08-28 LAB — GLUCOSE, CAPILLARY: Glucose-Capillary: 129 mg/dL — ABNORMAL HIGH (ref 70–99)

## 2019-08-28 LAB — RETICULOCYTES
Immature Retic Fract: 6.8 % (ref 2.3–15.9)
RBC.: 3.25 MIL/uL — ABNORMAL LOW (ref 3.87–5.11)
Retic Count, Absolute: 82.5 10*3/uL (ref 19.0–186.0)
Retic Ct Pct: 2.5 % (ref 0.4–3.1)

## 2019-08-28 LAB — FERRITIN: Ferritin: 233 ng/mL (ref 11–307)

## 2019-08-28 SURGERY — COLONOSCOPY
Anesthesia: Moderate Sedation

## 2019-08-28 MED ORDER — LIDOCAINE VISCOUS HCL 2 % MT SOLN
OROMUCOSAL | Status: DC | PRN
  Administered 2019-08-28: 1 via OROMUCOSAL

## 2019-08-28 MED ORDER — STERILE WATER FOR IRRIGATION IR SOLN
Status: DC | PRN
  Administered 2019-08-28: 1.5 mL

## 2019-08-28 MED ORDER — MEPERIDINE HCL 50 MG/ML IJ SOLN
INTRAMUSCULAR | Status: AC
  Filled 2019-08-28: qty 1

## 2019-08-28 MED ORDER — LIDOCAINE VISCOUS HCL 2 % MT SOLN
OROMUCOSAL | Status: AC
  Filled 2019-08-28: qty 15

## 2019-08-28 MED ORDER — SODIUM CHLORIDE 0.9 % IV SOLN: INTRAVENOUS | Status: DC

## 2019-08-28 MED ORDER — MEPERIDINE HCL 50 MG/ML IJ SOLN
INTRAMUSCULAR | Status: DC | PRN
  Administered 2019-08-28 (×2): 25 mg via INTRAVENOUS

## 2019-08-28 MED ORDER — MIDAZOLAM HCL 5 MG/5ML IJ SOLN
INTRAMUSCULAR | Status: AC
  Filled 2019-08-28: qty 10

## 2019-08-28 MED ORDER — MIDAZOLAM HCL 5 MG/5ML IJ SOLN
INTRAMUSCULAR | Status: DC | PRN
  Administered 2019-08-28: 2 mg via INTRAVENOUS
  Administered 2019-08-28: 1 mg via INTRAVENOUS
  Administered 2019-08-28 (×2): 2 mg via INTRAVENOUS

## 2019-08-28 NOTE — H&P (Signed)
Joyce [name] is an 79 y.o. female.   Chief Complaint: Patient is here for esophagogastroduodenoscopy and colonoscopy. HPI: Patient is 79 year old Caucasian female with multiple medical problems including diabetes mellitus coronary artery disease who has a history of iron deficiency anemia.  She has been on p.o. iron for a number of years.  She also has received iron infusion on 2 occasions.  She has responded to iron infusion.  She denies nausea vomiting heartburn abdominal pain melena or rectal bleeding.  She also denies hematuria or vaginal bleeding.  She reports some difficulty in swallowing since her neck surgery.  She has never had a episode of food impaction.  She is on clopidogrel but does not take other OTC NSAIDs.  She also complains of constipation which started 4 months ago.  She is doing better with prune juice and Linzess.  She has good appetite and her weight has been stable. Last colonoscopy was 5 to 6 years ago and was normal. She has been off Plavix for 5 days. Family history is negative for celiac disease or CRC.  Past Medical History:  Diagnosis Date  . Anemia   . Arthritis   . Bilateral pleural effusion    Postoperative, status post Pleurx catheter and talc treatment - Dr. Prescott Gum  . 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 Chronic kidney disease (CKD), stage III (moderate)   . Chronic lower back pain   . Essential hypertension   . History of pneumonia   . Hyperlipidemia   . Myocardial infarction [facility])    Peripheral neuropathy   . Type II diabetes mellitus (HCC)         Fibromyalgia  Past Surgical History:  Procedure Laterality Date  . ABDOMINAL HYSTERECTOMY  1980's  . ANTERIOR CERVICAL DECOMP/DISCECTOMY FUSION  2000's  . APPENDECTOMY  1970's  . BACK SURGERY    . CHEST TUBE INSERTION Left 08/07/2014   Procedure: INSERTION PLEURAL DRAINAGE CATHETER;  Surgeon: Ivin Poot,  MD;  Location: [place];  Service: Thoracic;  Laterality: Left;  . CHEST TUBE INSERTION Right 08/12/2014   Procedure: INSERTION PLEURAL DRAINAGE CATHETER;  Surgeon: Ivin Poot, MD;  Location: [place];  Service: Thoracic;  Laterality: Right;  . CHOLECYSTECTOMY  ~ 2005  . CORONARY ARTERY BYPASS GRAFT N/A 06/15/2014   Procedure: CORONARY ARTERY BYPASS GRAFTING (CABG);  Surgeon: Ivin Poot, MD;  Location: [place];  Service: Open Heart Surgery;  Laterality: N/A;  . CORONARY ATHERECTOMY N/A 05/22/2018   Procedure: CORONARY ATHERECTOMY;  Surgeon: Belva Crome, MD;  Location: [place] [facility];  Service: Cardiovascular;  Laterality: N/A;  . CORONARY STENT INTERVENTION N/A 05/22/2018   Procedure: CORONARY STENT INTERVENTION;  Surgeon: Belva Crome, MD;  Location: [place] [facility];  Service: Cardiovascular;  Laterality: N/A;  rca   . INTRAOPERATIVE TRANSESOPHAGEAL ECHOCARDIOGRAM N/A 06/15/2014   Procedure: INTRAOPERATIVE TRANSESOPHAGEAL ECHOCARDIOGRAM;  Surgeon: Ivin Poot, MD;  Location: [place];  Service: Open Heart Surgery;  Laterality: N/A;  . LEFT HEART CATH AND CORS/GRAFTS ANGIOGRAPHY N/A 05/20/2018   Procedure: LEFT HEART CATH AND CORS/GRAFTS ANGIOGRAPHY;  Surgeon: Lorretta Harp, MD;  Location: [place] [facility];  Service: Cardiovascular;  Laterality: N/A;  . LEFT HEART CATHETERIZATION WITH CORONARY ANGIOGRAM N/A 06/11/2014   Procedure: LEFT HEART CATHETERIZATION WITH CORONARY ANGIOGRAM;  Surgeon: Blane Ohara, MD;  Location: [facility]  CATH LAB;  Service: Cardiovascular;  Laterality: N/A;  . LUMBAR DISC SURGERY  1980's X 1; 1990's X  2000's X 1  . REMOVAL OF PLEURAL DRAINAGE CATHETER Bilateral 02/11/2015   Procedure: REMOVAL OF PLEURAL DRAINAGE CATHETER;  Surgeon: Ivin Poot, MD;  Location: [place];  Service: Thoracic;  Laterality: Bilateral;  . TALC PLEURODESIS Bilateral 02/11/2015   Procedure: Pietro Cassis;  Surgeon: Ivin Poot, MD;  Location: [place];  Service: Thoracic;   Laterality: Bilateral;  . TONSILLECTOMY  ~ 1950    Family History  Problem Relation Age of Onset  . Stroke Father   . Diabetes Mother   . Hypertension Mother   . Alcoholism Brother   . Gout Brother   . Arthritis Brother   . Alcoholism Brother   . Heart disease Sister   . Heart attack Sister   . Cancer Sister        hodkins lymphoma  . CAD Paternal Grandmother        heart attack  . CAD Paternal Uncle        heart attack  . Heart attack Paternal Grandfather   . Osteoporosis Sister    Social History:  reports that she has never smoked. She has never used smokeless tobacco. She reports that she does not drink alcohol or use drugs.  Allergies:  Allergies  Allergen Reactions  . Carvedilol Other (See Comments)    confusion  . Codeine Other (See Comments)    confusion  . Metoprolol Palpitations    Medications Prior to Admission  Medication Sig Dispense Refill  . albuterol (VENTOLIN HFA) 108 (90 Base) MCG/ACT inhaler Inhale 2 puffs into the lungs every 4 (four) hours as needed for wheezing or shortness of breath. (Patient taking differently: Inhale 2 puffs into the lungs every 4 (four) hours as needed for wheezing or shortness of breath. Wheezing/shortness of breath) 8 g 5  . amLODipine (NORVASC) 10 MG tablet Take 10 mg by mouth daily.   5  . atorvastatin (LIPITOR) 40 MG tablet Take 40 mg by mouth every evening.     . calcitRIOL (ROCALTROL) 0.25 MCG capsule Take 0.25 mcg by mouth every Monday, Wednesday, and Friday.    . Cholecalciferol (VITAMIN D3) 2000 units TABS Take 4,000 Units by mouth daily.     . clopidogrel (PLAVIX) 75 MG tablet TAKE ONE TABLET BY MOUTH DAILY WITH BREAKFAST (Patient taking differently: Take 75 mg by mouth daily. ) 90 tablet 1  . ferrous sulfate 325 (65 FE) MG tablet TAKE ONE TABLET BY MOUTH DAILY (Patient taking differently: Take 325 mg by mouth every Monday, Wednesday, and Friday. ) 60 tablet 6  . fluticasone (FLONASE) 50 MCG/ACT nasal spray Place 1  spray into both nostrils daily.     . furosemide (LASIX) 40 MG tablet Take 40 mg by mouth daily.    . hydrALAZINE (APRESOLINE) 100 MG tablet Take 100 mg by mouth 3 (three) times daily.    . insulin glargine (LANTUS) 100 UNIT/ML injection Inject 40 Units into the skin at bedtime.     . insulin lispro (HUMALOG) 100 UNIT/ML KiwkPen Inject 8 Units into the skin 3 (three) times daily with meals.     . isosorbide mononitrate (IMDUR) 60 MG 24 hr tablet Take 1 tablet (60 mg total) by mouth daily. 90 tablet 3  . linaclotide (LINZESS) 145 MCG CAPS capsule Take 1 capsule (145 mcg total) by mouth daily before breakfast. Take 30 min before breakfast 30 capsule 3  .  lisinopril (PRINIVIL,ZESTRIL) 5 MG tablet Take 5 mg by mouth every evening.     . Multiple Vitamin (MULTIVITAMIN WITH MINERALS) TABS tablet Take 1 tablet by mouth daily.    . naloxone (NARCAN) nasal spray 4 mg/0.1 mL Place 1 spray into the nose daily as needed (accidental overdose.). Use as directed.     Marland Kitchen PLENVU 140 g SOLR Take 140 g by mouth once.    . polyethylene glycol (MIRALAX / GLYCOLAX) packet Take 17 g by mouth daily as needed for mild constipation. (Patient taking differently: Take 17 g by mouth daily as needed for mild constipation. Constipation.) 14 each 2  . primidone (MYSOLINE) 50 MG tablet Take 100 mg by mouth daily.   5  . Probiotic Product (PROBIOTIC DAILY PO) Take 1 capsule by mouth in the morning, at noon, and at bedtime.     . Tiotropium Bromide-Olodaterol (STIOLTO RESPIMAT) 2.5-2.5 MCG/ACT AERS Take 2 puffs by mouth daily. 4 g 11  . traMADol (ULTRAM) 50 MG tablet Take 50-100 mg by mouth every 8 (eight) hours as needed (pain.).     Marland Kitchen ACCU-CHEK AVIVA PLUS test strip Inject 1 strip as directed 4 (four) times daily.  2  . FIBER ADULT GUMMIES PO Take 2 tablets by mouth daily.     Marland Kitchen GLUCERNA (GLUCERNA) LIQD Take 237 mLs by mouth daily as needed (nutritional support).     . hyoscyamine (ANASPAZ) 0.125 MG TBDP disintergrating tablet  Place 0.125 mg under the tongue every 6 (six) hours as needed (spasms).     . Insulin Syringe-Needle U-100 (INSULIN SYRINGE .5CC/30GX5/16") 30G X 5/16" 0.5 ML MISC Inject 0.5 mLs as directed 3 (three) times daily.  3  . nitroGLYCERIN (NITROSTAT) 0.4 MG SL tablet Place 1 tablet (0.4 mg total) under the tongue every 5 (five) minutes x 3 doses as needed for chest pain. 25 tablet 3    Results for orders placed or performed during the hospital encounter of 08/28/19 (from the past 48 hour(s))  Glucose, capillary     Status: Abnormal   Collection Time: 08/28/19 12:36 PM  Result Value Ref Range   Glucose-Capillary 129 (H) 70 - 99 mg/dL    Comment: Glucose reference range applies only to samples taken after fasting for at least 8 hours.  CBC     Status: Abnormal   Collection Time: 08/28/19  1:05 PM  Result Value Ref Range   WBC 3.6 (L) 4.0 - 10.5 K/uL   RBC 3.22 (L) 3.87 - 5.11 MIL/uL   Hemoglobin 9.8 (L) 12.0 - 15.0 g/dL   HCT 29.6 (L) 36.0 - 46.0 %   MCV 91.9 80.0 - 100.0 fL   MCH 30.4 26.0 - 34.0 pg   MCHC 33.1 30.0 - 36.0 g/dL   RDW 12.6 11.5 - 15.5 %   Platelets 146 (L) 150 - 400 K/uL   nRBC 0.0 0.0 - 0.2 %    Comment: Performed at [facility], [address], [place], [place] [address]  Reticulocytes     Status: Abnormal   Collection Time: 08/28/19  1:05 PM  Result Value Ref Range   Retic Ct Pct 2.5 0.4 - 3.1 %   RBC. 3.25 (L) 3.87 - 5.11 MIL/uL   Retic Count, Absolute 82.5 19.0 - 186.0 K/uL   Immature Retic Fract 6.8 2.3 - 15.9 %    Comment: Performed at [facility], [address] [place], [place] [address]   No results found.  Review of Systems  Blood pressure Marland Kitchen)  175/71, temperature 99.5 F (37.5 C), temperature source Oral, resp. rate 20, height 5\' 5"  (1.651 m), SpO2 99 %. Physical Exam  Constitutional: She appears well-developed and well-nourished.  HENT:  Mouth/Throat: Oropharynx is clear and moist.  Eyes: Conjunctivae are normal. No scleral icterus.  Neck: No  thyromegaly present.  Cardiovascular: Normal rate and regular rhythm.  Murmur heard. Grade 2/6 systolic murmur best heard at aortic area.  Respiratory: Effort normal and breath sounds normal.  Sternal scar.  GI:  Abdomen is full.  She has laparoscopy scars across upper abdomen.  Abdomen is soft and nontender without organomegaly or masses.  Musculoskeletal:        General: No edema.  Lymphadenopathy:    She has no cervical adenopathy.  Neurological: She is alert.  Skin: Skin is warm and dry.     Assessment/Plan Unexplained iron deficiency anemia. Recent onset of constipation. Agnostic esophagogastroduodenoscopy and colonoscopy.  Hildred Laser, MD 08/28/2019, 1:39 PM

## 2019-08-28 NOTE — Discharge Instructions (Signed)
Resume clopidogrel tomorrow evening. Resume other medications as before. Resume usual diet. No driving for 24 hours. Physician will call with results of blood test and biopsy.   Upper Endoscopy, Adult, Care After This sheet gives you information about how to care for yourself after your procedure. Your health care provider may also give you more specific instructions. If you have problems or questions, contact your health care provider.  Dr. Laural Golden:  [phone] What can I expect after the procedure? After the procedure, it is common to have:  A sore throat.  Mild stomach pain or discomfort.  Bloating.  Nausea. Follow these instructions at home:   Follow instructions from your health care provider about what to eat or drink after your procedure.  Return to your normal activities as told by your health care provider.   Take over-the-counter and prescription medicines only as told by your health care provider.  Do not drive for 24 hours.  Keep all follow-up visits as told by your health care provider. This is important. Contact a health care provider if you have:  A sore throat that lasts longer than one day.  Trouble swallowing. Get help right away if:  You vomit blood or your vomit looks like coffee grounds.  You have: ? A fever. ? Bloody, black, or tarry stools. ? A severe sore throat or you cannot swallow. ? Difficulty breathing. ? Severe pain in your chest or abdomen. Summary  After the procedure, it is common to have a sore throat, mild stomach discomfort, bloating, and nausea.  Do not drive for 24 hours if you were given a sedative during the procedure.  Follow instructions from your health care provider about what to eat or drink after your procedure.  Return to your normal activities as told by your health care provider. This information is not intended to replace advice given to you by your health care provider. Make sure you discuss any questions you  have with your health care provider. Document Revised: 12/04/2017 Document Reviewed: 11/12/2017 Elsevier Patient Education  [place].   Colonoscopy, Adult, Care After This sheet gives you information about how to care for yourself after your procedure. Your health care provider may also give you more specific instructions. If you have problems or questions, contact your health care provider. What can I expect after the procedure? After the procedure, it is common to have:  A small amount of blood in your stool for 24 hours after the procedure.  Some gas.  Mild cramping or bloating of your abdomen. Follow these instructions at home: Eating and drinking   Drink enough fluid to keep your urine pale yellow.  Resume your normal diet as instructed by your health care provider.  Activity  Rest as told by your health care provider.  Return to your normal activities as told by your health care provider.  Managing cramping and bloating   Try walking around when you have cramps or feel bloated. General instructions  For the first 24 hours after the procedure: ? Do not drive or use machinery. ? Do not sign important documents. ? Do not drink alcohol. ? Do your regular daily activities at a slower pace than normal. ? Eat soft foods that are easy to digest.  Take over-the-counter and prescription medicines only as told by your health care provider.  Keep all follow-up visits as told by your health care provider. This is important. Contact a health care provider if:  You have blood in your  stool 2-3 days after the procedure. Get help right away if you have:  More than a small spotting of blood in your stool.  Large blood clots in your stool.  Swelling of your abdomen.  Nausea or vomiting.  A fever.  Increasing pain in your abdomen that is not relieved with medicine. Summary  After the procedure, it is common to have a small amount of blood in your stool. You  may also have mild cramping and bloating of your abdomen.  For the first 24 hours after the procedure, do not drive or use machinery, sign important documents, or drink alcohol.  Get help right away if you have a lot of blood in your stool, nausea or vomiting, a fever, or increased pain in your abdomen. This information is not intended to replace advice given to you by your health care provider. Make sure you discuss any questions you have with your health care provider. Document Revised: 01/06/2019 Document Reviewed: 01/06/2019 Elsevier Patient Education  [place].

## 2019-08-28 NOTE — Op Note (Signed)
[facility] Patient Name: Joyce [name] Procedure Date: 08/28/2019 1:59 PM MRN: [id number] Date of Birth: November 27, 1940 Attending MD: Hildred Laser , MD CSN: [id number] Age: 79 Admit Type: Outpatient Procedure:                Colonoscopy Indications:              Unexplained iron deficiency anemia, Constipation Providers:                Hildred Laser, MD, Charlsie Quest. Theda Sers RN, RN, Aram Candela Referring MD:             Allie Dimmer, MD and Derek Jack, MD Medicines:                Midazolam 2 mg IV Complications:            No immediate complications. Estimated Blood Loss:     Estimated blood loss was minimal. Procedure:                Pre-Anesthesia Assessment:                           - Prior to the procedure, a History and Physical                            was performed, and patient medications and                            allergies were reviewed. The patient's tolerance of                            previous anesthesia was also reviewed. The risks                            and benefits of the procedure and the sedation                            options and risks were discussed with the patient.                            All questions were answered, and informed consent                            was obtained. Prior Anticoagulants: The patient                            last took Plavix (clopidogrel) 5 days prior to the                            procedure. ASA Grade Assessment: III - A patient                            with severe systemic disease. After reviewing the  risks and benefits, the patient was deemed in                            satisfactory condition to undergo the procedure.                           After obtaining informed consent, the colonoscope                            was passed under direct vision. Throughout the                            procedure, the patient's blood pressure,  pulse, and                            oxygen saturations were monitored continuously. The                            [id number]-[id number] ([id number]) scope was introduced through                            the anus and advanced to the the cecum, identified                            by appendiceal orifice and ileocecal valve. The                            colonoscopy was performed without difficulty. The                            patient tolerated the procedure well. The quality                            of the bowel preparation was excellent. The                            ileocecal valve, appendiceal orifice, and rectum                            were photographed. Scope In: 2:02:22 PM Scope Out: 2:29:05 PM Scope Withdrawal Time: 0 hours 20 minutes 22 seconds  Total Procedure Duration: 0 hours 26 minutes 43 seconds  Findings:      The perianal and digital rectal examinations were normal.      Two polyps were found in the splenic flexure and hepatic flexure. The       polyps were 4 to 5 mm in size. These polyps were removed with a cold       snare. Resection and retrieval were complete. The pathology specimen was       placed into Bottle Number 2.      External hemorrhoids were found during retroflexion. The hemorrhoids       were small.      Anal papilla(e) were hypertrophied. Impression:               - Two 4 to 5 mm polyps  at the splenic flexure and                            at the hepatic flexure, removed with a cold snare.                            Resected and retrieved.                           - External hemorrhoids.                           - Anal papilla(e) were hypertrophied. Moderate Sedation:      Moderate (conscious) sedation was administered by the endoscopy nurse       and supervised by the endoscopist. The following parameters were       monitored: oxygen saturation, heart rate, blood pressure, CO2       capnography and response to care. Total physician intraservice time  was       34 minutes. Recommendation:           - Patient has a contact number available for                            emergencies. The signs and symptoms of potential                            delayed complications were discussed with the                            patient. Return to normal activities tomorrow.                            Written discharge instructions were provided to the                            patient.                           - High fiber diet and diabetic (ADA) diet today.                           - Continue present medications.                           - Resume Plavix (clopidogrel) at prior dose                            tomorrow.                           - Await pathology results.                           - Repeat colonoscopy is recommended. The                            colonoscopy date will  be determined after pathology                            results from today's exam become available for                            review. Procedure Code(s):        --- Professional ---                           [phone], Colonoscopy, flexible; with removal of                            tumor(s), polyp(s), or other lesion(s) by snare                            technique                           99153, Moderate sedation; each additional 15                            minutes intraservice time                           G0500, Moderate sedation services provided by the                            same physician or other qualified health care                            professional performing a gastrointestinal                            endoscopic service that sedation supports,                            requiring the presence of an independent trained                            observer to assist in the monitoring of the                            patient's level of consciousness and physiological                            status; initial 15 minutes of intra-service  time;                            patient age 42 years or older (additional time may                            be reported with [phone], as appropriate) Diagnosis Code(s):        --- Professional ---  K63.5, Polyp of colon                           K62.89, Other specified diseases of anus and rectum                           K64.4, Residual hemorrhoidal skin tags                           D50.9, Iron deficiency anemia, unspecified                           K59.00, Constipation, unspecified CPT copyright 2019 American Medical Association. All rights reserved. The codes documented in this report are preliminary and upon coder review may  be revised to meet current compliance requirements. Hildred Laser, MD Hildred Laser, MD 08/28/2019 2:59:25 PM This report has been signed electronically. Number of Addenda: 0

## 2019-08-28 NOTE — Op Note (Signed)
[facility] Patient Name: Joyce [name] Procedure Date: 08/28/2019 12:44 PM MRN: [id number] Date of Birth: 06-21-1941 Attending MD: Hildred Laser , MD CSN: [id number] Age: 79 Admit Type: Outpatient Procedure:                Upper GI endoscopy Indications:              Unexplained iron deficiency anemia Providers:                Hildred Laser, MD, Charlsie Quest. Theda Sers RN, RN, Aram Candela Referring MD:             Allie Dimmer, MD and Lilian Coma, MD Medicines:                Lidocaine jelly, Meperidine 50 mg IV, Midazolam 5                            mg IV Complications:            No immediate complications. Estimated Blood Loss:     Estimated blood loss was minimal. Procedure:                Pre-Anesthesia Assessment:                           - Prior to the procedure, a History and Physical                            was performed, and patient medications and                            allergies were reviewed. The patient's tolerance of                            previous anesthesia was also reviewed. The risks                            and benefits of the procedure and the sedation                            options and risks were discussed with the patient.                            All questions were answered, and informed consent                            was obtained. Prior Anticoagulants: The patient                            last took Plavix (clopidogrel) 5 days prior to the                            procedure. ASA Grade Assessment: III - A patient  with severe systemic disease. After reviewing the                            risks and benefits, the patient was deemed in                            satisfactory condition to undergo the procedure.                           After obtaining informed consent, the endoscope was                            passed under direct vision. Throughout the             procedure, the patient's blood pressure, pulse, and                            oxygen saturations were monitored continuously. The                            GIF-H190 ([id number]) scope was introduced through the                            mouth, and advanced to the second part of duodenum.                            The upper GI endoscopy was accomplished without                            difficulty. The patient tolerated the procedure                            well. Scope In: 1:49:55 PM Scope Out: 1:57:27 PM Total Procedure Duration: 0 hours 7 minutes 32 seconds  Findings:      The hypopharynx was normal.      The examined esophagus was normal.      The Z-line was regular and was found 32 cm from the incisors.      A 2 cm hiatal hernia was present.      The entire examined stomach was normal.      The duodenal bulb and second portion of the duodenum were normal.       Biopsies were taken with a cold forceps for histology. The pathology       specimen was placed into Bottle Number 1. Impression:               - Normal hypopharynx.                           - Normal esophagus.                           - Z-line regular, 32 cm from the incisors.                           - 2 cm hiatal hernia.                           -  Normal stomach.                           - Normal duodenal bulb and second portion of the                            duodenum. Biopsied. Moderate Sedation:      Moderate (conscious) sedation was administered by the endoscopy nurse       and supervised by the endoscopist. The following parameters were       monitored: oxygen saturation, heart rate, blood pressure, CO2       capnography and response to care. Total physician intraservice time was       11 minutes. Recommendation:           - Patient has a contact number available for                            emergencies. The signs and symptoms of potential                            delayed complications were  discussed with the                            patient. Return to normal activities tomorrow.                            Written discharge instructions were provided to the                            patient.                           - Resume previous diet today.                           - Continue present medications.                           - Resume Plavix (clopidogrel) at prior dose                            tomorrow.                           - Await pathology results. Procedure Code(s):        --- Professional ---                           [id number], Esophagogastroduodenoscopy, flexible,                            transoral; with biopsy, single or multiple                           G0500, Moderate sedation services provided by the  same physician or other qualified health care                            professional performing a gastrointestinal                            endoscopic service that sedation supports,                            requiring the presence of an independent trained                            observer to assist in the monitoring of the                            patient's level of consciousness and physiological                            status; initial 15 minutes of intra-service time;                            patient age 80 years or older (additional time may                            be reported with [phone], as appropriate) Diagnosis Code(s):        --- Professional ---                           K44.9, Diaphragmatic hernia without obstruction or                            gangrene                           D50.9, Iron deficiency anemia, unspecified CPT copyright 2019 American Medical Association. All rights reserved. The codes documented in this report are preliminary and upon coder review may  be revised to meet current compliance requirements. Hildred Laser, MD Hildred Laser, MD 08/28/2019 2:42:25 PM This report has been  signed electronically. Number of Addenda: 0

## 2019-08-29 ENCOUNTER — Other Ambulatory Visit (HOSPITAL_COMMUNITY): Payer: Self-pay

## 2019-08-29 DIAGNOSIS — D649 Anemia, unspecified: Secondary | ICD-10-CM

## 2019-09-01 ENCOUNTER — Inpatient Hospital Stay (HOSPITAL_COMMUNITY): Payer: Medicare Other | Attending: Hematology

## 2019-09-01 LAB — SURGICAL PATHOLOGY

## 2019-09-01 LAB — ERYTHROPOIETIN: Erythropoietin: 5 m[IU]/mL (ref 2.6–18.5)

## 2019-09-03 LAB — METHYLMALONIC ACID, SERUM: Methylmalonic Acid, Quantitative: 195 nmol/L (ref 0–378)

## 2019-09-06 ENCOUNTER — Other Ambulatory Visit: Payer: Self-pay | Admitting: Nurse Practitioner

## 2019-09-08 ENCOUNTER — Other Ambulatory Visit: Payer: Self-pay

## 2019-09-08 ENCOUNTER — Encounter (HOSPITAL_COMMUNITY): Payer: Self-pay | Admitting: Hematology

## 2019-09-08 ENCOUNTER — Inpatient Hospital Stay (HOSPITAL_BASED_OUTPATIENT_CLINIC_OR_DEPARTMENT_OTHER): Payer: Medicare Other | Admitting: Hematology

## 2019-09-08 DIAGNOSIS — D649 Anemia, unspecified: Secondary | ICD-10-CM | POA: Diagnosis not present

## 2019-09-08 NOTE — Progress Notes (Signed)
Virtual Visit via Telephone Note  I connected with Joyce [name] on 09/08/19 at  4:05 PM EDT by telephone and verified that I am speaking with the correct person using two identifiers.   I discussed the limitations, risks, security and privacy concerns of performing an evaluation and management service by telephone and the availability of in person appointments. I also discussed with the patient that there may be a patient responsible charge related to this service. The patient expressed understanding and agreed to proceed.   History of Present Illness: She was evaluated in the clinic for normocytic anemia since 2015 thought to be secondary to CKD.  She is also taking iron tablet 3 times a week.  She is on Plavix.  Stool for occult blood was negative in office.   Observations/Objective: She underwent colonoscopy and EGD on 08/28/2019.  Today her energy levels are great.  She is taking iron tablet 3 times a week.  She is taking Metamucil and Linzess which is helping with her constipation.  Appetite is 100%.  Energy levels are 50%.  Arthritic pains all over the body rated as 5 out of 10.  Numbness in the fingers tips is also stable.  Assessment and Plan:  1.  Normocytic anemia: -Combination anemia from CKD and relative iron deficiency. -EGD on 08/28/2019 shows normal esophagus, 2 cm hiatal hernia, normal stomach, duodenal bulb and second part of duodenum. -Colonoscopy showed two 4 to 5 mm polyps at the splenic flexure and at the hepatic flexure, resected.  External hemorrhoids.  Anal Pap today were hypertrophied.  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 -She is taking iron tablet on Monday, Wednesday and Friday.  She is tolerating it well. -She is back on Plavix after the colonoscopy and EGD. -Her energy levels are better today. -We reviewed her labs from 08/28/2019.  Hemoglobin is stable at 9.8.  Ferritin is 233 and percent saturation is 29.  Methylmalonic acid is  normal.  LDH was normal.  EPO level was 5. -I have told her to continue taking iron tablets at this time.  We will repeat her labs in 3 months. -She also has a mild leukopenia and thrombocytopenia with differential being early MDS.  2.  Constipation: -She is taking Linzess and Metamucil which is helping.   Follow Up Instructions: RTC 3 months with labs 1 to 2 days prior.   I discussed the assessment and treatment plan with the patient. The patient was provided an opportunity to ask questions and all were answered. The patient agreed with the plan and demonstrated an understanding of the instructions.   The patient was advised to call back or seek an in-person evaluation if the symptoms worsen or if the condition fails to improve as anticipated.  I provided 11 minutes of non-face-to-face time during this encounter.   Derek Jack, MD

## 2019-09-18 ENCOUNTER — Telehealth: Payer: Self-pay

## 2019-09-18 NOTE — Telephone Encounter (Signed)

## 2019-09-18 NOTE — Telephone Encounter (Signed)

## 2019-09-28 ENCOUNTER — Other Ambulatory Visit: Payer: Self-pay | Admitting: Cardiology

## 2019-09-28 NOTE — Progress Notes (Signed)
Virtual Visit via Telephone Note   This visit type was conducted due to national recommendations for restrictions regarding the COVID-19 Pandemic (e.g. social distancing) in an effort to limit this patient's exposure and mitigate transmission in our community.  Due to her co-morbid illnesses, this patient is at least at moderate risk for complications without adequate follow up.  This format is felt to be most appropriate for this patient at this time.  The patient did not have access to video technology/had technical difficulties with video requiring transitioning to audio format only (telephone).  All issues noted in this document were discussed and addressed.  No physical exam could be performed with this format.  Please refer to the patient's chart for her  consent to telehealth for [facility].   The patient was identified using 2 identifiers.  Date:  09/29/2019   ID:  Joyce [name], DOB Nov 06, 1940, MRN [id number]  Patient Location: Home Provider Location: Office  PCP:  Allie Dimmer, MD  Cardiologist:  Rozann Lesches, MD Electrophysiologist:  None   Evaluation Performed:  Follow-Up Visit  Chief Complaint:  Cardiac follow-up  History of Present Illness:    Joyce [name] is a 79 y.o. female last assessed via telehealth encounter in October 2020.  We spoke by phone today.  She does not report any progressive angina symptoms on medical therapy.  Main concern is back and hip pain, she states that she anticipates getting right hip surgery with Dr. Mayer Camel sometime in the summer.  I reviewed her medications.  Current cardiac regimen includes Plavix, Norvasc, Lipitor, Lasix, hydralazine, Imdur, and lisinopril.  Recent LDL was 48.  Cardiac catheterization results from 2019 are noted below.  The patient does not have symptoms concerning for COVID-19 infection (fever, chills, cough, or new shortness of breath).  She just recently had her first vaccine dose.   Past Medical  History:  Diagnosis Date  . Anemia   . Arthritis   . Bilateral pleural effusion    Postoperative, status post Pleurx catheter and talc treatment - Dr. Prescott Gum  . 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 Chronic kidney disease (CKD), stage III (moderate)   . Chronic lower back pain   . Essential hypertension   . History of pneumonia   . Hyperlipidemia   . Myocardial infarction ([place])   . Neuromuscular disorder ([place])   . Type II diabetes mellitus ([place])    Past Surgical History:  Procedure Laterality Date  . ABDOMINAL HYSTERECTOMY  1980's  . ANTERIOR CERVICAL DECOMP/DISCECTOMY FUSION  2000's  . APPENDECTOMY  1970's  . BACK SURGERY    . BIOPSY  08/28/2019   Procedure: BIOPSY;  Surgeon: Rogene Houston, MD;  [facility];  Service: Endoscopy;;  duodenal  . CHEST TUBE INSERTION Left 08/07/2014   Procedure: INSERTION PLEURAL DRAINAGE CATHETER;  Surgeon: Ivin Poot, MD;  Location: [place];  Service: Thoracic;  Laterality: Left;  . CHEST TUBE INSERTION Right 08/12/2014   Procedure: INSERTION PLEURAL DRAINAGE CATHETER;  Surgeon: Ivin Poot, MD;  Location: [place];  Service: Thoracic;  Laterality: Right;  . CHOLECYSTECTOMY  ~ 2005  . COLONOSCOPY N/A 08/28/2019   Procedure: COLONOSCOPY;  Surgeon: Rogene Houston, MD;  [facility];  Service: Endoscopy;  Laterality: N/A;  100  . CORONARY ARTERY BYPASS GRAFT N/A 06/15/2014   Procedure: CORONARY  ARTERY BYPASS GRAFTING (CABG);  Surgeon: Ivin Poot, MD;  Location: [place];  Service: Open Heart Surgery;  Laterality: N/A;  . CORONARY ATHERECTOMY N/A 05/22/2018   Procedure: CORONARY ATHERECTOMY;  Surgeon: Belva Crome, MD;  Location: [place] CV LAB;  Service: Cardiovascular;  Laterality: N/A;  . CORONARY STENT INTERVENTION N/A 05/22/2018   Procedure: CORONARY STENT INTERVENTION;  Surgeon: Belva Crome, MD;  Location: [place]  CV LAB;  Service: Cardiovascular;  Laterality: N/A;  rca   . ESOPHAGOGASTRODUODENOSCOPY N/A 08/28/2019   Procedure: ESOPHAGOGASTRODUODENOSCOPY (EGD);  Surgeon: Rogene Houston, MD;  [facility];  Service: Endoscopy;  Laterality: N/A;  . INTRAOPERATIVE TRANSESOPHAGEAL ECHOCARDIOGRAM N/A 06/15/2014   Procedure: INTRAOPERATIVE TRANSESOPHAGEAL ECHOCARDIOGRAM;  Surgeon: Ivin Poot, MD;  Location: [place];  Service: Open Heart Surgery;  Laterality: N/A;  . LEFT HEART CATH AND CORS/GRAFTS ANGIOGRAPHY N/A 05/20/2018   Procedure: LEFT HEART CATH AND CORS/GRAFTS ANGIOGRAPHY;  Surgeon: Lorretta Harp, MD;  Location: [place] CV LAB;  Service: Cardiovascular;  Laterality: N/A;  . LEFT HEART CATHETERIZATION WITH CORONARY ANGIOGRAM N/A 06/11/2014   Procedure: LEFT HEART CATHETERIZATION WITH CORONARY ANGIOGRAM;  Surgeon: Blane Ohara, MD;  Location: [facility] CATH LAB;  Service: Cardiovascular;  Laterality: N/A;  . LUMBAR DISC SURGERY  1980's X 1; 1990's X  2000's X 1  . POLYPECTOMY  08/28/2019   Procedure: POLYPECTOMY;  Surgeon: Rogene Houston, MD;  [facility];  Service: Endoscopy;;  . REMOVAL OF PLEURAL DRAINAGE CATHETER Bilateral 02/11/2015   Procedure: REMOVAL OF PLEURAL DRAINAGE CATHETER;  Surgeon: Ivin Poot, MD;  Location: [place];  Service: Thoracic;  Laterality: Bilateral;  . TALC PLEURODESIS Bilateral 02/11/2015   Procedure: Pietro Cassis;  Surgeon: Ivin Poot, MD;  Location: [place];  Service: Thoracic;  Laterality: Bilateral;  . TONSILLECTOMY  ~ 1950     Current Meds  Medication Sig  . albuterol (VENTOLIN HFA) 108 (90 Base) MCG/ACT inhaler Inhale 2 puffs into the lungs every 4 (four) hours as needed for wheezing or shortness of breath.  Marland Kitchen amLODipine (NORVASC) 10 MG tablet Take 10 mg by mouth daily.   Marland Kitchen atorvastatin (LIPITOR) 40 MG tablet Take 40 mg by mouth every evening.   . calcitRIOL (ROCALTROL) 0.25 MCG capsule Take 0.25 mcg by mouth every Monday,  Wednesday, and Friday.  . Cholecalciferol (VITAMIN D3) 2000 units TABS Take 4,000 Units by mouth daily.   . clopidogrel (PLAVIX) 75 MG tablet Take 1 tablet (75 mg total) by mouth daily with breakfast.  . ferrous sulfate 325 (65 FE) MG tablet TAKE ONE TABLET BY MOUTH DAILY (Patient taking differently: Take 325 mg by mouth every Monday, Wednesday, and Friday. )  . fluticasone (FLONASE) 50 MCG/ACT nasal spray Place 1 spray into both nostrils daily.   . furosemide (LASIX) 40 MG tablet Take 40 mg by mouth daily.  Marland Kitchen GLUCERNA (GLUCERNA) LIQD Take 237 mLs by mouth daily as needed (nutritional support).   . hydrALAZINE (APRESOLINE) 100 MG tablet Take 100 mg by mouth 3 (three) times daily.  . insulin glargine (LANTUS) 100 UNIT/ML injection Inject 40 Units into the skin at bedtime.   . insulin lispro (HUMALOG) 100 UNIT/ML KiwkPen Inject 8 Units into the skin 3 (three) times daily with meals.   . Insulin Syringe-Needle U-100 (INSULIN SYRINGE .5CC/30GX5/16") 30G X 5/16" 0.5 ML MISC Inject 0.5 mLs as directed 3 (three) times daily.  . isosorbide mononitrate (IMDUR) 60 MG 24 hr tablet TAKE  ONE TABLET BY MOUTH DAILY  . linaclotide (LINZESS) 145 MCG CAPS capsule Take 1 capsule (145 mcg total) by mouth daily before breakfast. Take 30 min before breakfast  . lisinopril (PRINIVIL,ZESTRIL) 5 MG tablet Take 5 mg by mouth every evening.   . Multiple Vitamin (MULTIVITAMIN WITH MINERALS) TABS tablet Take 1 tablet by mouth daily.  . naloxone (NARCAN) nasal spray 4 mg/0.1 mL Place 1 spray into the nose daily as needed (accidental overdose.). Use as directed.   . nitroGLYCERIN (NITROSTAT) 0.4 MG SL tablet Place 1 tablet (0.4 mg total) under the tongue every 5 (five) minutes x 3 doses as needed for chest pain.  . primidone (MYSOLINE) 50 MG tablet Take 100 mg by mouth daily.   . Probiotic Product (PROBIOTIC DAILY PO) Take 1 capsule by mouth in the morning, at noon, and at bedtime.   . psyllium (METAMUCIL) 58.6 % packet Take by  mouth daily.  . Tiotropium Bromide-Olodaterol (STIOLTO RESPIMAT) 2.5-2.5 MCG/ACT AERS Take 2 puffs by mouth daily.  . traMADol (ULTRAM) 50 MG tablet Take 50-100 mg by mouth every 8 (eight) hours as needed (pain.).   . [DISCONTINUED] hyoscyamine (ANASPAZ) 0.125 MG TBDP disintergrating tablet Place 0.125 mg under the tongue every 6 (six) hours as needed (spasms).      Allergies:   Carvedilol, Codeine, and Metoprolol   ROS:   Lower back pain.  Prior CV studies:   The following studies were reviewed today:  Echocardiogram 05/19/2018: Study Conclusions  - Left ventricle: The cavity size was normal. Wall thickness was increased in a pattern of moderate LVH. Systolic function was normal. The estimated ejection fraction was in the range of 60% to 65%. Possible basal inferior hypokinesis. Doppler parameters are consistent with abnormal left ventricular relaxation (grade 1 diastolic dysfunction). The E/e&' ratio is between 8-15, suggesting indeterminate LV filling pressure. - Mitral valve: Mildly thickened leaflets . There was mild regurgitation. - Left atrium: The atrium was mildly dilated. - Right atrium: The atrium was mildly dilated. - Inferior vena cava: The vessel was normal in size. The respirophasic diameter changes were in the normal range (= 50%), consistent with normal central venous pressure.  Impressions:  - Compared to a prior study in 2018, the LVEF is lower at 60-65% with moderate LVH and mild biatrial enlargment. There is basal inferior hypokinesis.  Cardiac catheterization 05/20/2018:  Prox LAD to Mid LAD lesion is 100% stenosed.  Origin lesion is 100% stenosed.  Origin to Prox Graft lesion is 100% stenosed.  Origin to Prox Graft lesion is 100% stenosed.  Ramus lesion is 95% stenosed.  Prox Cx to Mid Cx lesion is 90% stenosed.  Prox LAD lesion is 80% stenosed.  Prox RCA lesion is 90% stenosed.  Mid RCA lesion is 99% stenosed.   Dist RCA lesion is 60% stenosed.  IMPRESSION:Joyce [name] is now 4 years post coronary artery bypass grafting x4. All of her vein grafts are occluded at the aorta. LIMA is patent to LAD. She has high-grade calcified proximal mid and distal dominant RCA stenosis along with a high-grade mid ramus branch stenosis just proximal to the occluded vein graft. I used 100 cc of contrast and and decided to stage her intervention given her chronic renal insufficiency. I performed a femoral angiogram and Mynx closed her right common femoral puncture site. I did give her 40 mg of IV Lasix because of an elevated LVEDP of 19 as well as hydralazine IV for retention. She left the lab in stable condition.Marland Kitchen  PCI 05/22/2018:  Complex with orbital atherectomy followed by stenting of the proximal mid and distal RCA.  The mid and distal lesions were reduced from 99 and 70% stenosis to 0 and 0% stenosis using a 2.75 x 34 mm Onyx postdilated to 3.0 mm in diameter with TIMI grade III flow.  The proximal 85% RCA with TIMI grade III flow was reduced to 0% using a 22 x 3.5 mm Onyx postdilated to 3.75 mm in diameter with resultant TIMI grade III flow.  RECOMMENDATIONS:   Aspirin and Plavix dual antiplatelet therapy for at least 30 days and preferably for 3 months at which point Plavix monotherapy can be used to complete a 18-month duration of therapy.  Labs/Other Tests and Data Reviewed:    EKG:  An ECG dated 06/04/2018 was personally reviewed today and demonstrated:  Sinus rhythm with PACs.  Recent Labs: 01/30/2019: TSH 4.584 08/01/2019: ALT 16; BUN 34; Creatinine, Ser 1.98; Potassium 4.9; Sodium 132 08/28/2019: Hemoglobin 9.8; Platelets 146  March 2021: Cholesterol 138, triglycerides 196, HDL 50, LDL 48  Wt Readings from Last 3 Encounters:  09/29/19 198 lb (89.8 kg)  07/16/19 201 lb 1.6 oz (91.2 kg)  04/16/19 199 lb 3.2 oz (90.4 kg)     Objective:    Vital Signs:  Ht 5\' 5"  (1.651 m)   Wt 198 lb (89.8 kg)    BMI 32.95 kg/m    Not able to obtain vital signs today. Patient spoke in full sentences, not short of breath. No audible wheezing or coughing.  ASSESSMENT & PLAN:    1.  Multivessel CAD status post CABG in 2015.  She has graft disease and is status post atherectomy and stent intervention to the mid to distal RCA in November 2019, currently stable without progressive angina on medical therapy.  Continue with observation.  Maintain Plavix, Lipitor, Norvasc, Imdur, and lisinopril.  2.  Mixed hyperlipidemia, continues on Lipitor with recent LDL 48.  3.  CKD stage III most recent creatinine 1.85.   Time:   Today, I have spent 6 minutes with the patient with telehealth technology discussing the above problems.     Medication Adjustments/Labs and Tests Ordered: Current medicines are reviewed at length with the patient today.  Concerns regarding medicines are outlined above.   Tests Ordered: No orders of the defined types were placed in this encounter.   Medication Changes: No orders of the defined types were placed in this encounter.   Follow Up:  In Person 6 months in the [place] office.  Signed, Rozann Lesches, MD  09/29/2019 10:28 AM    [place]

## 2019-09-29 ENCOUNTER — Encounter: Payer: Self-pay | Admitting: Cardiology

## 2019-09-29 ENCOUNTER — Other Ambulatory Visit: Payer: Self-pay | Admitting: Cardiology

## 2019-09-29 ENCOUNTER — Telehealth (INDEPENDENT_AMBULATORY_CARE_PROVIDER_SITE_OTHER): Payer: Medicare Other | Admitting: Cardiology

## 2019-09-29 VITALS — Ht 65.0 in | Wt 198.0 lb

## 2019-09-29 DIAGNOSIS — E782 Mixed hyperlipidemia: Secondary | ICD-10-CM

## 2019-09-29 DIAGNOSIS — I25119 Atherosclerotic heart disease of native coronary artery with unspecified angina pectoris: Secondary | ICD-10-CM

## 2019-09-29 DIAGNOSIS — N1832 Chronic kidney disease, stage 3b: Secondary | ICD-10-CM

## 2019-09-29 NOTE — Patient Instructions (Addendum)
Your physician wants you to follow-up in: [place] will receive a reminder letter in the mail two months in advance. If you don't receive a letter, please call our office to schedule the follow-up appointment.  Your physician recommends that you continue on your current medications as directed. Please refer to the Current Medication list given to you today.  PLEASE CALL OUR OFFICE IF YOU SCHEDULE SURGERY BEFORE YOUR NEXT APPOINTMENT   Thank you for choosing [facility]!!

## 2019-11-21 ENCOUNTER — Other Ambulatory Visit: Payer: Self-pay | Admitting: Adult Health

## 2019-11-24 ENCOUNTER — Other Ambulatory Visit: Payer: Self-pay | Admitting: Cardiology

## 2019-12-09 ENCOUNTER — Other Ambulatory Visit: Payer: Self-pay

## 2019-12-09 ENCOUNTER — Inpatient Hospital Stay (HOSPITAL_COMMUNITY): Payer: Medicare Other | Attending: Hematology

## 2019-12-09 DIAGNOSIS — D631 Anemia in chronic kidney disease: Secondary | ICD-10-CM | POA: Insufficient documentation

## 2019-12-09 DIAGNOSIS — N189 Chronic kidney disease, unspecified: Secondary | ICD-10-CM | POA: Diagnosis not present

## 2019-12-09 DIAGNOSIS — E611 Iron deficiency: Secondary | ICD-10-CM | POA: Diagnosis not present

## 2019-12-09 DIAGNOSIS — D649 Anemia, unspecified: Secondary | ICD-10-CM

## 2019-12-09 LAB — CBC WITH DIFFERENTIAL/PLATELET
Abs Immature Granulocytes: 0.02 10*3/uL (ref 0.00–0.07)
Basophils Absolute: 0 10*3/uL (ref 0.0–0.1)
Basophils Relative: 0 %
Eosinophils Absolute: 0.1 10*3/uL (ref 0.0–0.5)
Eosinophils Relative: 2 %
HCT: 30.5 % — ABNORMAL LOW (ref 36.0–46.0)
Hemoglobin: 9.9 g/dL — ABNORMAL LOW (ref 12.0–15.0)
Immature Granulocytes: 0 %
Lymphocytes Relative: 31 %
Lymphs Abs: 1.6 10*3/uL (ref 0.7–4.0)
MCH: 29.4 pg (ref 26.0–34.0)
MCHC: 32.5 g/dL (ref 30.0–36.0)
MCV: 90.5 fL (ref 80.0–100.0)
Monocytes Absolute: 0.4 10*3/uL (ref 0.1–1.0)
Monocytes Relative: 7 %
Neutro Abs: 3 10*3/uL (ref 1.7–7.7)
Neutrophils Relative %: 60 %
Platelets: 176 10*3/uL (ref 150–400)
RBC: 3.37 MIL/uL — ABNORMAL LOW (ref 3.87–5.11)
RDW: 12.8 % (ref 11.5–15.5)
WBC: 5.1 10*3/uL (ref 4.0–10.5)
nRBC: 0 % (ref 0.0–0.2)

## 2019-12-09 LAB — VITAMIN B12: Vitamin B-12: 283 pg/mL (ref 180–914)

## 2019-12-09 LAB — IRON AND TIBC
Iron: 63 ug/dL (ref 28–170)
Saturation Ratios: 27 % (ref 10.4–31.8)
TIBC: 237 ug/dL — ABNORMAL LOW (ref 250–450)
UIBC: 174 ug/dL

## 2019-12-09 LAB — FERRITIN: Ferritin: 223 ng/mL (ref 11–307)

## 2019-12-09 LAB — FOLATE: Folate: 17.3 ng/mL (ref >5.9)

## 2019-12-31 ENCOUNTER — Telehealth: Payer: Self-pay | Admitting: Cardiology

## 2019-12-31 NOTE — Telephone Encounter (Signed)
Patient informed and verbalized understanding of plan. 

## 2019-12-31 NOTE — Telephone Encounter (Signed)
Patient called stating that she has an upcoming surgery scheduled for 8/9 with Dr. Mayer Camel. States that Dr. Domenic Polite told her she would need an EKG before the surgery.

## 2019-12-31 NOTE — Telephone Encounter (Signed)
We had a telehealth encounter in April and she mentioned hip surgery pending for the summer.  Please get preop information from surgeon.  Unless she has had any new cardiac symptoms I doubt that she will need any cardiac testing beyond an ECG, her last tracing was in 2019.

## 2020-01-01 ENCOUNTER — Ambulatory Visit (HOSPITAL_COMMUNITY): Payer: Medicare Other | Admitting: Nurse Practitioner

## 2020-01-01 NOTE — Progress Notes (Deleted)
[place], [place] [id number]   CLINIC:  Medical Oncology/Hematology  PCP:  Allie Dimmer, [facility] [address] [place] [address] [phone]   REASON FOR VISIT:  Follow-up for ***  PRIOR THERAPY: ***  NGS Results: ***  CURRENT THERAPY: ***  BRIEF ONCOLOGIC HISTORY:  Oncology History   No history exists.    CANCER STAGING: Cancer Staging No matching staging information was found for the patient.   INTERVAL HISTORY:  Joyce [name] 79 y.o. female returns for routine follow-up and consideration for next cycle of chemotherapy.   Due for cycle #*** of *** today.   Overall, she tells me she has been feeling pretty well. Energy levels ***; appetite ***.   Overall, she feels ready for next cycle of chemo today.     REVIEW OF SYSTEMS:  Review of Systems - Oncology   PAST MEDICAL/SURGICAL HISTORY:  Past Medical History:  Diagnosis Date  . Anemia   . Arthritis   . Bilateral pleural effusion    Postoperative, status post Pleurx catheter and talc treatment - Dr. Prescott Gum  . 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 Chronic kidney disease (CKD), stage III (moderate)   . Chronic lower back pain   . Essential hypertension   . History of pneumonia   . Hyperlipidemia   . Myocardial infarction ([place])   . Neuromuscular disorder ([place])   . Type II diabetes mellitus ([place])    Past Surgical History:  Procedure Laterality Date  . ABDOMINAL HYSTERECTOMY  1980's  . ANTERIOR CERVICAL DECOMP/DISCECTOMY FUSION  2000's  . APPENDECTOMY  1970's  . BACK SURGERY    . BIOPSY  08/28/2019   Procedure: BIOPSY;  Surgeon: Rogene Houston, MD;  [facility];  Service: Endoscopy;;  duodenal  . CHEST TUBE INSERTION Left 08/07/2014   Procedure: INSERTION PLEURAL DRAINAGE CATHETER;  Surgeon: Ivin Poot, MD;  Location: [facility];  Service: Thoracic;   Laterality: Left;  . CHEST TUBE INSERTION Right 08/12/2014   Procedure: INSERTION PLEURAL DRAINAGE CATHETER;  Surgeon: Ivin Poot, MD;  Location: [facility];  Service: Thoracic;  Laterality: Right;  . CHOLECYSTECTOMY  ~ 2005  . COLONOSCOPY N/A 08/28/2019   Procedure: COLONOSCOPY;  Surgeon: Rogene Houston, MD;  [facility];  Service: Endoscopy;  Laterality: N/A;  100  . CORONARY ARTERY BYPASS GRAFT N/A 06/15/2014   Procedure: CORONARY ARTERY BYPASS GRAFTING (CABG);  Surgeon: Ivin Poot, MD;  Location: [facility];  Service: Open Heart Surgery;  Laterality: N/A;  . CORONARY ATHERECTOMY N/A 05/22/2018   Procedure: CORONARY ATHERECTOMY;  Surgeon: Belva Crome, MD;  Location: [facility];  Service: Cardiovascular;  Laterality: N/A;  . CORONARY STENT INTERVENTION N/A 05/22/2018   Procedure: CORONARY STENT INTERVENTION;  Surgeon: Belva Crome, MD;  Location: [facility];  Service: Cardiovascular;  Laterality: N/A;  rca   . ESOPHAGOGASTRODUODENOSCOPY N/A 08/28/2019   Procedure: ESOPHAGOGASTRODUODENOSCOPY (EGD);  Surgeon: Rogene Houston, MD;  [facility];  Service: Endoscopy;  Laterality: N/A;  . INTRAOPERATIVE TRANSESOPHAGEAL ECHOCARDIOGRAM N/A 06/15/2014   Procedure: INTRAOPERATIVE TRANSESOPHAGEAL ECHOCARDIOGRAM;  Surgeon: Ivin Poot, MD;  Location: [facility];  Service: Open Heart Surgery;  Laterality: N/A;  . LEFT HEART CATH AND CORS/GRAFTS ANGIOGRAPHY N/A 05/20/2018   Procedure: LEFT HEART CATH AND CORS/GRAFTS ANGIOGRAPHY;  Surgeon: Lorretta Harp, MD;  Location: [facility];  Service: Cardiovascular;  Laterality: N/A;  . LEFT HEART CATHETERIZATION WITH CORONARY ANGIOGRAM N/A 06/11/2014   Procedure: LEFT HEART CATHETERIZATION WITH CORONARY ANGIOGRAM;  Surgeon: Blane Ohara, MD;  Location: [facility];  Service: Cardiovascular;  Laterality: N/A;  . LUMBAR DISC SURGERY  1980's X 1; 1990's X  2000's X 1  . POLYPECTOMY  08/28/2019   Procedure:  POLYPECTOMY;  Surgeon: Rogene Houston, MD;  [facility];  Service: Endoscopy;;  . REMOVAL OF PLEURAL DRAINAGE CATHETER Bilateral 02/11/2015   Procedure: REMOVAL OF PLEURAL DRAINAGE CATHETER;  Surgeon: Ivin Poot, MD;  Location: [facility];  Service: Thoracic;  Laterality: Bilateral;  . TALC PLEURODESIS Bilateral 02/11/2015   Procedure: Pietro Cassis;  Surgeon: Ivin Poot, MD;  Location: [facility];  Service: Thoracic;  Laterality: Bilateral;  . TONSILLECTOMY  ~ 1950     SOCIAL HISTORY:  Social History   Socioeconomic History  . Marital status: Widowed    Spouse name: Not on file  . Number of children: 2  . Years of education: Not on file  . Highest education level: Not on file  Occupational History  . Occupation: retired  Tobacco Use  . Smoking status: Never Smoker  . Smokeless tobacco: Never Used  Vaping Use  . Vaping Use: Never used  Substance and Sexual Activity  . Alcohol use: No    Alcohol/week: 0.0 standard drinks  . Drug use: No  . Sexual activity: Not on file  Other Topics Concern  . Not on file  Social History Narrative  . Not on file   Social Determinants of Health   Financial Resource Strain:   . Difficulty of Paying Living Expenses:   Food Insecurity:   . Worried About Charity fundraiser in the Last Year:   . Arboriculturist in the Last Year:   Transportation Needs:   . Film/video editor (Medical):   Marland Kitchen Lack of Transportation (Non-Medical):   Physical Activity:   . Days of Exercise per Week:   . Minutes of Exercise per Session:   Stress:   . Feeling of Stress :   Social Connections:   . Frequency of Communication with Friends and Family:   . Frequency of Social Gatherings with Friends and Family:   . Attends Religious Services:   . Active Member of Clubs or Organizations:   . Attends Archivist Meetings:   Marland Kitchen Marital Status:   Intimate Partner Violence:   . Fear of Current or Ex-Partner:   . Emotionally Abused:   Marland Kitchen  Physically Abused:   . Sexually Abused:     FAMILY HISTORY:  Family History  Problem Relation Age of Onset  . Stroke Father   . Diabetes Mother   . Hypertension Mother   . Alcoholism Brother   . Gout Brother   . Arthritis Brother   . Alcoholism Brother   . Heart disease Sister   . Heart attack Sister   . Cancer Sister        hodkins lymphoma  . CAD Paternal Grandmother        heart attack  . CAD Paternal Uncle        heart attack  . Heart attack Paternal Grandfather   . Osteoporosis Sister     CURRENT MEDICATIONS:  Outpatient Encounter Medications as of 01/01/2020  Medication Sig  . ACCU-CHEK AVIVA PLUS test strip Inject 1  strip as directed 4 (four) times daily.  Marland Kitchen albuterol (VENTOLIN HFA) 108 (90 Base) MCG/ACT inhaler Inhale 2 puffs into the lungs every 4 (four) hours as needed for wheezing or shortness of breath.  Marland Kitchen amLODipine (NORVASC) 10 MG tablet Take 10 mg by mouth daily.   Marland Kitchen atorvastatin (LIPITOR) 40 MG tablet Take 40 mg by mouth every evening.   . calcitRIOL (ROCALTROL) 0.25 MCG capsule Take 0.25 mcg by mouth every Monday, Wednesday, and Friday.  . Cholecalciferol (VITAMIN D3) 2000 units TABS Take 4,000 Units by mouth daily.   . clopidogrel (PLAVIX) 75 MG tablet TAKE ONE TABLET BY MOUTH DAILY WITH BREAKFAST  . FEROSUL 325 (65 Fe) MG tablet TAKE ONE TABLET BY MOUTH DAILY  . fluticasone (FLONASE) 50 MCG/ACT nasal spray Place 1 spray into both nostrils daily.   . furosemide (LASIX) 40 MG tablet Take 40 mg by mouth daily.  Marland Kitchen GLUCERNA (GLUCERNA) LIQD Take 237 mLs by mouth daily as needed (nutritional support).   . hydrALAZINE (APRESOLINE) 100 MG tablet Take 100 mg by mouth 3 (three) times daily.  . insulin glargine (LANTUS) 100 UNIT/ML injection Inject 40 Units into the skin at bedtime.   . insulin lispro (HUMALOG) 100 UNIT/ML KiwkPen Inject 8 Units into the skin 3 (three) times daily with meals.   . Insulin Syringe-Needle U-100 (INSULIN SYRINGE .5CC/30GX5/16") 30G X 5/16"  0.5 ML MISC Inject 0.5 mLs as directed 3 (three) times daily.  . isosorbide mononitrate (IMDUR) 60 MG 24 hr tablet TAKE ONE TABLET BY MOUTH DAILY  . linaclotide (LINZESS) 145 MCG CAPS capsule Take 1 capsule (145 mcg total) by mouth daily before breakfast. Take 30 min before breakfast  . lisinopril (PRINIVIL,ZESTRIL) 5 MG tablet Take 5 mg by mouth every evening.   . Multiple Vitamin (MULTIVITAMIN WITH MINERALS) TABS tablet Take 1 tablet by mouth daily.  . naloxone (NARCAN) nasal spray 4 mg/0.1 mL Place 1 spray into the nose daily as needed (accidental overdose.). Use as directed.   . nitroGLYCERIN (NITROSTAT) 0.4 MG SL tablet Place 1 tablet (0.4 mg total) under the tongue every 5 (five) minutes x 3 doses as needed for chest pain.  . primidone (MYSOLINE) 50 MG tablet Take 100 mg by mouth daily.   . Probiotic Product (PROBIOTIC DAILY PO) Take 1 capsule by mouth in the morning, at noon, and at bedtime.   . psyllium (METAMUCIL) 58.6 % packet Take by mouth daily.  . Tiotropium Bromide-Olodaterol (STIOLTO RESPIMAT) 2.5-2.5 MCG/ACT AERS Take 2 puffs by mouth daily.  . traMADol (ULTRAM) 50 MG tablet Take 50-100 mg by mouth every 8 (eight) hours as needed (pain.).    No facility-administered encounter medications on file as of 01/01/2020.    ALLERGIES:  Allergies  Allergen Reactions  . Carvedilol Other (See Comments)    confusion  . Codeine Other (See Comments)    confusion  . Metoprolol Palpitations     PHYSICAL EXAM:  ECOG Performance status: ***  There were no vitals filed for this visit. There were no vitals filed for this visit. Physical Exam   LABORATORY DATA:  I have reviewed the labs as listed.  CBC    Component Value Date/Time   WBC 5.1 12/09/2019 1004   RBC 3.37 (L) 12/09/2019 1004   HGB 9.9 (L) 12/09/2019 1004   HCT 30.5 (L) 12/09/2019 1004   PLT 176 12/09/2019 1004   MCV 90.5 12/09/2019 1004   MCH 29.4 12/09/2019 1004   MCHC 32.5 12/09/2019 1004   RDW  12.8 12/09/2019  1004   LYMPHSABS 1.6 12/09/2019 1004   MONOABS 0.4 12/09/2019 1004   EOSABS 0.1 12/09/2019 1004   BASOSABS 0.0 12/09/2019 1004   CMP Latest Ref Rng & Units 08/01/2019 06/04/2019 01/30/2019  Glucose 70 - 99 mg/dL 97 103(H) 78  BUN 8 - 23 mg/dL 34(H) 41(H) 37(H)  Creatinine 0.44 - 1.00 mg/dL 1.98(H) 1.89(H) 1.86(H)  Sodium 135 - 145 mmol/L 132(L) 136 134(L)  Potassium 3.5 - 5.1 mmol/L 4.9 4.9 4.6  Chloride 98 - 111 mmol/L 101 103 103  CO2 22 - 32 mmol/L 23 21(L) 27  Calcium 8.9 - 10.3 mg/dL 8.5(L) 8.9 8.7(L)  Total Protein 6.5 - 8.1 g/dL 6.0(L) 6.6 6.5  Total Bilirubin 0.3 - 1.2 mg/dL 0.6 0.6 0.5  Alkaline Phos 38 - 126 U/L 58 55 54  AST 15 - 41 U/L 18 23 16   ALT 0 - 44 U/L 16 19 15     DIAGNOSTIC IMAGING:  I have independently reviewed the scans and discussed with the patient.  ASSESSMENT & PLAN:  Normocytic anemia 1.  Normocytic anemia: -Normocytic anemia since 2015.  CKD since 2012. 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 -She is taking oral iron tablets Monday Wednesday and Friday since November 2019.  She does report constipation but is treating it with suppositories and prune juice. -EGD on 08/28/2019 showing normal esophagus, 2 cm hiatal hernia, normal stomach, duodenal bulb and second part of the duodenum. -Colonoscopy on 08/28/2019 showed two 4 to 5 mm polyps at the splenic flexure and at the hepatic flexure, resected.  External hemorrhoids.  Anal Pap today were hypertrophied.  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 -She denies any easy bruising or bleeding.  She is on Plavix 75 mg daily. -Last blood transfusion of 2 units PRBC in November 2019 when the patient had an MI. -Patient was worked up and found S85 and folic acid were normal.  SPEP is negative.  Stool for occult blood x3 is negative.  EPO was 5 -She also has mild leukopenia and thrombocytopenia with differentials being early MDS. -Labs done on 06/04/2019 showed hemoglobin  10.0, ferritin 268, percent saturation 27.  Creatinine is stable at 1.89. -She does report severe tiredness. -We will see her back in 6 weeks with repeat labs.  2.  Constipation: -She is taking Linzess and Metamucil which is helping.     Orders placed this encounter:  No orders of the defined types were placed in this encounter.     Derek Jack, MD [place] [phone]

## 2020-01-01 NOTE — Assessment & Plan Note (Deleted)
1.  Normocytic anemia: -Normocytic anemia since 2015.  CKD since 2012. 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 -She is taking oral iron tablets Monday Wednesday and Friday since November 2019.  She does report constipation but is treating it with suppositories and prune juice. -EGD on 08/28/2019 showing normal esophagus, 2 cm hiatal hernia, normal stomach, duodenal bulb and second part of the duodenum. -Colonoscopy on 08/28/2019 showed two 4 to 5 mm polyps at the splenic flexure and at the hepatic flexure, resected.  External hemorrhoids.  Anal Pap today were hypertrophied.  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 -She denies any easy bruising or bleeding.  She is on Plavix 75 mg daily. -Last blood transfusion of 2 units PRBC in November 2019 when the patient had an MI. -Patient was worked up and found F94 and folic acid were normal.  SPEP is negative.  Stool for occult blood x3 is negative.  EPO was 5 -She also has mild leukopenia and thrombocytopenia with differentials being early MDS. -Labs done on 06/04/2019 showed hemoglobin 10.0, ferritin 268, percent saturation 27.  Creatinine is stable at 1.89. -She does report severe tiredness. -We will see her back in 6 weeks with repeat labs.  2.  Constipation: -She is taking Linzess and Metamucil which is helping.

## 2020-01-06 ENCOUNTER — Inpatient Hospital Stay (HOSPITAL_COMMUNITY): Payer: Medicare Other | Attending: Hematology | Admitting: Nurse Practitioner

## 2020-01-06 DIAGNOSIS — D649 Anemia, unspecified: Secondary | ICD-10-CM

## 2020-01-06 NOTE — Progress Notes (Signed)
Joyce [name]:    Joyce Dimmer, MD [address] [place] [address]   DIAGNOSIS: Normocytic anemia  CURRENT THERAPY: Intermittent iron infusions  INTERVAL HISTORY: Joyce [name] 79 y.o. female was called for a telephone visit for normocytic anemia.  Patient reports she is doing well since her last visit.  She denies any bright red bleeding per rectum or melena.  She denies easy bruising or bleeding. Denies any nausea, vomiting, or diarrhea. Denies any new pains. Had not noticed any recent bleeding such as epistaxis, hematuria or hematochezia. Denies recent chest pain on exertion, shortness of breath on minimal exertion, pre-syncopal episodes, or palpitations. Denies any numbness or tingling in hands or feet. Denies any recent fevers, infections, or recent hospitalizations. Patient reports appetite at 100% and energy level at 50%.    Patient Active Problem List   Diagnosis Date Noted  . 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 Acute on chronic blood loss anemia 05/23/2018  . Coronary artery disease involving native coronary artery of native heart with unstable angina pectoris ([place]) 05/23/2018  . S/P right coronary artery (RCA) stent placement 05/23/2018  . 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 Acute bacterial rhinosinusitis 08/13/2014  . Bilateral pleural effusion 08/04/2014  . Dyspnea   . Leukopenia 07/28/2014  . Normocytic anemia 07/27/2014  . Exudative pleural effusion   . Diastolic CHF, chronic ([place]) 07/26/2014  . 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 NSTEMI (non-ST elevated myocardial infarction) ([place]) 06/11/2014  . Chronic kidney disease (CKD), stage III (moderate) ([place]) 06/11/2014  . Essential hypertension 06/11/2014  . Diabetes mellitus type 2 in obese ([place]) 06/11/2014    is allergic to carvedilol, codeine, and metoprolol.  MEDICAL HISTORY: Past Medical History:  Diagnosis Date  . Anemia   . Arthritis   . Bilateral  pleural effusion    Postoperative, status post Pleurx catheter and talc treatment - Dr. Prescott Gum  . 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 Chronic kidney disease (CKD), stage III (moderate)   . Chronic lower back pain   . Essential hypertension   . History of pneumonia   . Hyperlipidemia   . Myocardial infarction ([place])   . Neuromuscular disorder ([place])   . Type II diabetes mellitus ([place])     SURGICAL HISTORY: Past Surgical History:  Procedure Laterality Date  . ABDOMINAL HYSTERECTOMY  1980's  . ANTERIOR CERVICAL DECOMP/DISCECTOMY FUSION  2000's  . APPENDECTOMY  1970's  . BACK SURGERY    . BIOPSY  08/28/2019   Procedure: BIOPSY;  Surgeon: Rogene Houston, MD;  [facility];  Service: Endoscopy;;  duodenal  . CHEST TUBE INSERTION Left 08/07/2014   Procedure: INSERTION PLEURAL DRAINAGE CATHETER;  Surgeon: Ivin Poot, MD;  Location: [facility];  Service: Thoracic;  Laterality: Left;  . CHEST TUBE INSERTION Right 08/12/2014   Procedure: INSERTION PLEURAL DRAINAGE CATHETER;  Surgeon: Ivin Poot, MD;  Location: [facility];  Service: Thoracic;  Laterality: Right;  . CHOLECYSTECTOMY  ~ 2005  . COLONOSCOPY N/A 08/28/2019   Procedure: COLONOSCOPY;  Surgeon: Rogene Houston, MD;  [facility];  Service: Endoscopy;  Laterality: N/A;  100  . CORONARY ARTERY BYPASS GRAFT N/A 06/15/2014   Procedure: CORONARY ARTERY BYPASS GRAFTING (CABG);  Surgeon: Ivin Poot, MD;  Location: [facility];  Service: Open Heart  Surgery;  Laterality: N/A;  . CORONARY ATHERECTOMY N/A 05/22/2018   Procedure: CORONARY ATHERECTOMY;  Surgeon: Belva Crome, MD;  Location: [facility];  Service: Cardiovascular;  Laterality: N/A;  . CORONARY STENT INTERVENTION N/A 05/22/2018   Procedure: CORONARY STENT INTERVENTION;  Surgeon: Belva Crome, MD;  Location: [facility];  Service: Cardiovascular;   Laterality: N/A;  rca   . ESOPHAGOGASTRODUODENOSCOPY N/A 08/28/2019   Procedure: ESOPHAGOGASTRODUODENOSCOPY (EGD);  Surgeon: Rogene Houston, MD;  [facility];  Service: Endoscopy;  Laterality: N/A;  . INTRAOPERATIVE TRANSESOPHAGEAL ECHOCARDIOGRAM N/A 06/15/2014   Procedure: INTRAOPERATIVE TRANSESOPHAGEAL ECHOCARDIOGRAM;  Surgeon: Ivin Poot, MD;  Location: [facility];  Service: Open Heart Surgery;  Laterality: N/A;  . LEFT HEART CATH AND CORS/GRAFTS ANGIOGRAPHY N/A 05/20/2018   Procedure: LEFT HEART CATH AND CORS/GRAFTS ANGIOGRAPHY;  Surgeon: Lorretta Harp, MD;  Location: [facility];  Service: Cardiovascular;  Laterality: N/A;  . LEFT HEART CATHETERIZATION WITH CORONARY ANGIOGRAM N/A 06/11/2014   Procedure: LEFT HEART CATHETERIZATION WITH CORONARY ANGIOGRAM;  Surgeon: Blane Ohara, MD;  Location: [facility];  Service: Cardiovascular;  Laterality: N/A;  . LUMBAR DISC SURGERY  1980's X 1; 1990's X  2000's X 1  . POLYPECTOMY  08/28/2019   Procedure: POLYPECTOMY;  Surgeon: Rogene Houston, MD;  [facility];  Service: Endoscopy;;  . REMOVAL OF PLEURAL DRAINAGE CATHETER Bilateral 02/11/2015   Procedure: REMOVAL OF PLEURAL DRAINAGE CATHETER;  Surgeon: Ivin Poot, MD;  Location: [facility];  Service: Thoracic;  Laterality: Bilateral;  . TALC PLEURODESIS Bilateral 02/11/2015   Procedure: Pietro Cassis;  Surgeon: Ivin Poot, MD;  Location: [facility];  Service: Thoracic;  Laterality: Bilateral;  . TONSILLECTOMY  ~ 1950    SOCIAL HISTORY: Social History   Socioeconomic History  . Marital status: Widowed    Spouse name: Not on file  . Number of children: 2  . Years of education: Not on file  . Highest education level: Not on file  Occupational History  . Occupation: retired  Tobacco Use  . Smoking status: Never Smoker  . Smokeless tobacco: Never Used  Vaping Use  . Vaping Use: Never used  Substance and Sexual Activity  . Alcohol use: No     Alcohol/week: 0.0 standard drinks  . Drug use: No  . Sexual activity: Not on file  Other Topics Concern  . Not on file  Social History Narrative  . Not on file   Social Determinants of Health   Financial Resource Strain:   . Difficulty of Paying Living Expenses:   Food Insecurity:   . Worried About Charity fundraiser in the Last Year:   . Arboriculturist in the Last Year:   Transportation Needs:   . Film/video editor (Medical):   Marland Kitchen Lack of Transportation (Non-Medical):   Physical Activity:   . Days of Exercise per Week:   . Minutes of Exercise per Session:   Stress:   . Feeling of Stress :   Social Connections:   . Frequency of Communication with Friends and Family:   . Frequency of Social Gatherings with Friends and Family:   . Attends Religious Services:   . Active Member of Clubs or Organizations:   . Attends Archivist Meetings:   Marland Kitchen Marital Status:   Intimate Partner Violence:   . Fear of Current or Ex-Partner:   . Emotionally Abused:   Marland Kitchen Physically Abused:   .  Sexually Abused:     FAMILY HISTORY: Family History  Problem Relation Age of Onset  . Stroke Father   . Diabetes Mother   . Hypertension Mother   . Alcoholism Brother   . Gout Brother   . Arthritis Brother   . Alcoholism Brother   . Heart disease Sister   . Heart attack Sister   . Cancer Sister        hodkins lymphoma  . CAD Paternal Grandmother        heart attack  . CAD Paternal Uncle        heart attack  . Heart attack Paternal Grandfather   . Osteoporosis Sister     Review of Systems  Constitutional: Positive for fatigue.  All other systems reviewed and are negative.   Vital signs: -Deferred due to telephone visit  Physical Exam -Deferred due to telephone visit -Patient was alert and oriented over the phone in no acute distress next  LABORATORY DATA:  CBC    Component Value Date/Time   WBC 5.1 12/09/2019 1004   RBC 3.37 (L) 12/09/2019 1004   HGB 9.9 (L)  12/09/2019 1004   HCT 30.5 (L) 12/09/2019 1004   PLT 176 12/09/2019 1004   MCV 90.5 12/09/2019 1004   MCH 29.4 12/09/2019 1004   MCHC 32.5 12/09/2019 1004   RDW 12.8 12/09/2019 1004   LYMPHSABS 1.6 12/09/2019 1004   MONOABS 0.4 12/09/2019 1004   EOSABS 0.1 12/09/2019 1004   BASOSABS 0.0 12/09/2019 1004    CMP     Component Value Date/Time   NA 132 (L) 08/01/2019 1059   K 4.9 08/01/2019 1059   CL 101 08/01/2019 1059   CO2 23 08/01/2019 1059   GLUCOSE 97 08/01/2019 1059   BUN 34 (H) 08/01/2019 1059   CREATININE 1.98 (H) 08/01/2019 1059   CALCIUM 8.5 (L) 08/01/2019 1059   PROT 6.0 (L) 08/01/2019 1059   ALBUMIN 3.6 08/01/2019 1059   AST 18 08/01/2019 1059   ALT 16 08/01/2019 1059   ALKPHOS 58 08/01/2019 1059   BILITOT 0.6 08/01/2019 1059   GFRNONAA 24 (L) 08/01/2019 1059   GFRAA 28 (L) 08/01/2019 1059    All questions were answered to patient's stated satisfaction. Encouraged patient to call with any new concerns or questions before his next visit to the cancer center and we can certain see him sooner, if needed.     ASSESSMENT and THERAPY PLAN:   Normocytic anemia 1.  Normocytic anemia: -Normocytic anemia since 2015.  CKD since 2012. 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 -She is taking oral iron tablets Monday Wednesday and Friday since November 2019.   -EGD on 08/28/2019 showing normal esophagus, 2 cm hiatal hernia, normal stomach, duodenal bulb and second part of the duodenum. -Colonoscopy on 08/28/2019 showed two 4 to 5 mm polyps at the splenic flexure and at the hepatic flexure, resected.  External hemorrhoids.  Anal Pap today were hypertrophied.  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 -She denies any easy bruising or bleeding.  She is on Plavix 75 mg daily. -Last blood transfusion of 2 units PRBC in November 2019 when the patient had an MI. -Patient was worked [name] and found K87 and folic acid were normal.  SPEP is  negative.  Stool for occult blood x3 is negative.  EPO was 5 -She also has mild leukopenia and thrombocytopenia with differentials being early MDS. -Labs done on 12/09/2019 showed hemoglobin 9.9, ferritin 223, percent  saturation 27, platelets 176 -She does not need any IV iron at this time. -We will see her back in 3 months with repeat labs.  2.  Constipation: -She is taking Linzess and Metamucil which is helping.  3.  Hip surgery: -Patient reports she is going to be having her hip replacement on [facility]/02/2020 [facility]. -She is cleared for surgery as far as hematology standpoint. -We will be happy to assist with hematological support if needed.   Orders Placed This Encounter  Procedures  . Lactate dehydrogenase    Standing Status:   Future    Standing Expiration Date:   01/05/2021  . CBC with Differential/Platelet    Standing Status:   Future    Standing Expiration Date:   01/05/2021  . Comprehensive metabolic panel    Standing Status:   Future    Standing Expiration Date:   01/05/2021  . Ferritin    Standing Status:   Future    Standing Expiration Date:   01/05/2021  . Iron and TIBC    Standing Status:   Future    Standing Expiration Date:   01/05/2021  . Vitamin B12    Standing Status:   Future    Standing Expiration Date:   01/05/2021  . VITAMIN D 25 Hydroxy (Vit-D Deficiency, Fractures)    Standing Status:   Future    Standing Expiration Date:   01/05/2021    All questions were answered. The patient knows to call the clinic with any problems, questions or concerns. We can certainly see the patient much sooner if necessary. This note was electronically signed.  I provided 29 minutes of non face-to-face telephone visit time during this encounter, and > 50% was spent counseling as documented under my assessment & plan.   Glennie Isle, NP-C 01/06/2020

## 2020-01-06 NOTE — Assessment & Plan Note (Signed)
1.  Normocytic anemia: -Normocytic anemia since 2015.  CKD since 2012. 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 -She is taking oral iron tablets Monday Wednesday and Friday since November 2019.   -EGD on 08/28/2019 showing normal esophagus, 2 cm hiatal hernia, normal stomach, duodenal bulb and second part of the duodenum. -Colonoscopy on 08/28/2019 showed two 4 to 5 mm polyps at the splenic flexure and at the hepatic flexure, resected.  External hemorrhoids.  Anal Pap today were hypertrophied.  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 -She denies any easy bruising or bleeding.  She is on Plavix 75 mg daily. -Last blood transfusion of 2 units PRBC in November 2019 when the patient had an MI. -Patient was worked up and found T92 and folic acid were normal.  SPEP is negative.  Stool for occult blood x3 is negative.  EPO was 5 -She also has mild leukopenia and thrombocytopenia with differentials being early MDS. -Labs done on 12/09/2019 showed hemoglobin 9.9, ferritin 223, percent saturation 27, platelets 176 -She does not need any IV iron at this time. -We will see her back in 3 months with repeat labs.  2.  Constipation: -She is taking Linzess and Metamucil which is helping.  3.  Hip surgery: -Patient reports she is going to be having her hip replacement on 02/02/2020 at [facility]. -She is cleared for surgery as far as hematology standpoint. -We will be happy to assist with hematological support if needed.

## 2020-01-08 ENCOUNTER — Other Ambulatory Visit: Payer: Self-pay | Admitting: Orthopedic Surgery

## 2020-01-19 ENCOUNTER — Telehealth: Payer: Self-pay | Admitting: *Deleted

## 2020-01-19 NOTE — Telephone Encounter (Signed)
Left message for the patient to call back and speak to the on call preop APP of the day.  Patient had a h/o CAD s/p CABG 05/2014, HTN, HLD, DMII and CKD stage III. Last seen by Dr. Domenic Polite virtually on 09/29/2019 at which time she was doing well. She did mention to Dr. Domenic Polite at the time she likely will need surgery in the summer.   Dr. Domenic Polite, if patient has not chest pain, can she hold plavix for 5 days prior to hip surgery? Please forward your response to [facility] PREOP

## 2020-01-19 NOTE — Telephone Encounter (Signed)
   [facility] Pre-operative Risk Assessment    HEARTCARE STAFF: - Please ensure there is not already an duplicate clearance open for this procedure. - Under Visit Info/Reason for Call, type in Other and utilize the format Clearance MM/DD/YY or Clearance TBD. Do not use dashes or single digits. - If request is for dental extraction, please clarify the # of teeth to be extracted.  Request for surgical clearance:  1. What type of surgery is being performed? RIGHT ANTERIOR HIP ARTHROPLASTY   2. When is this surgery scheduled? 02/02/20   3. What type of clearance is required (medical clearance vs. Pharmacy clearance to hold med vs. Both)? MEDICAL  4. Are there any medications that need to be held prior to surgery and how long? PLAVIX   5. Practice name and name of physician performing surgery? [facility]; DR. FRANK ROWAN   6. What is the office phone number? [phone]   7.   What is the office fax number? [phone]  8.   Anesthesia type (None, local, MAC, general) ? SPINAL   Julaine Hua 01/19/2020, 8:22 AM  _________________________________________________________________   (provider comments below)

## 2020-01-20 NOTE — Telephone Encounter (Signed)
Needs EKG in [facility] for surgical clearance, can you schedule this?

## 2020-01-20 NOTE — Telephone Encounter (Signed)
Yes, if she has been clinically stable on medical therapy since our last encounter, I would anticipate she should be able to proceed with surgery as planned and can hold Plavix for 5 days.  She is status post atherectomy and stent intervention to the mid to distal RCA in November 2019 with baseline history of CABG and documented graft disease.

## 2020-01-20 NOTE — Telephone Encounter (Signed)
I spoke with the patient today.  She has done well from a cardiac standpoint- no chest pain.  She hasn't had an EKG since Dec 2019 (she mentioned this).  I'll arrange for her to have an EKG at the [facility] office, if no significant changes will send clearance for surgery.  Kerin Ransom PA-C 01/20/2020 9:30 AM

## 2020-01-20 NOTE — Telephone Encounter (Signed)
Nurse visit scheduled for 01/21/20 @8 :30 am for nurse visit [facility]

## 2020-01-21 ENCOUNTER — Ambulatory Visit: Payer: Medicare Other

## 2020-01-21 ENCOUNTER — Encounter (HOSPITAL_COMMUNITY): Payer: Self-pay

## 2020-01-21 ENCOUNTER — Ambulatory Visit (INDEPENDENT_AMBULATORY_CARE_PROVIDER_SITE_OTHER): Payer: Medicare Other | Admitting: *Deleted

## 2020-01-21 ENCOUNTER — Telehealth: Payer: Self-pay | Admitting: Cardiology

## 2020-01-21 DIAGNOSIS — I25119 Atherosclerotic heart disease of native coronary artery with unspecified angina pectoris: Secondary | ICD-10-CM

## 2020-01-21 DIAGNOSIS — I10 Essential (primary) hypertension: Secondary | ICD-10-CM

## 2020-01-21 NOTE — Patient Instructions (Addendum)
DUE TO COVID-19 ONLY ONE VISITOR IS ALLOWED TO COME WITH YOU AND STAY IN THE WAITING ROOM ONLY DURING PRE OP AND PROCEDURE DAY OF SURGERY. TWO VISITOR MAY VISIT WITH YOU AFTER SURGERY IN YOUR PRIVATE ROOM DURING VISITING HOURS ONLY!  YOU NEED TO HAVE A COVID 19 TEST ON___ 8-5-21____ @_______ , THIS TEST MUST BE DONE BEFORE SURGERY  COVID TESTING SITE [address] [place] [address], IT IS ON THE RIGHT GOING OUT [address] APPROXITAMELTELY 2 MINUTES PAST [employer] ON THE RIGHT. ONCE YOUR COVID TEST IS COMPLETED,  PLEASE BEGIN THE QUARANTINE INSTRUCTIONS AS OUTLINED IN YOUR HANDOUT.                Joyce [name]  01/21/2020   Your procedure is scheduled on: 02-02-20   Report to [facility] Main  Entrance   Report to admitting at       [place] AM     Call this number if you have problems the morning of surgery [phone]    Remember: NO SOLID FOOD AFTER MIDNIGHT THE NIGHT PRIOR TO SURGERY. NOTHING BY MOUTH EXCEPT CLEAR LIQUIDS UNTIL  0545  am  . PLEASE FINISH G2 DRINK PER SURGEON ORDER  WHICH NEEDS TO BE COMPLETED AT      0545  am then nothing by mouth .    CLEAR LIQUID DIET   Foods Allowed                                                                                              Foods Excluded  Coffee and tea, regular and decaf   No creamer                                  liquids that you cannot  Plain Jell-O any favor except red or purple                                           see through such as: Fruit ices (not with fruit pulp)                                                                    milk, soups, orange juice  Iced Popsicles                                                                      All solid food Carbonated beverages, regular and diet  Cranberry, grape and apple juices Sports drinks like Gatorade Lightly seasoned clear broth or consume(fat free) Sugar, honey syrup  _________      BRUSH YOUR TEETH MORNING OF SURGERY AND RINSE YOUR MOUTH OUT, NO CHEWING GUM CANDY OR MINTS.     Take these medicines the morning of surgery with A SIP OF WATER: inhalers and bring them with you, primidone, Imdur,hyrralazine, amlodipine, flonase  How to Manage Your Diabetes Before and After Surgery  Why is it important to control my blood sugar before and after surgery? . Improving blood sugar levels before and after surgery helps healing and can limit problems. . A way of improving blood sugar control is eating a healthy diet by: o  Eating less sugar and carbohydrates o  Increasing activity/exercise o  Talking with your doctor about reaching your blood sugar goals . High blood sugars (greater than 180 mg/dL) can raise your risk of infections and slow your recovery, so you will need to focus on controlling your diabetes during the weeks before surgery. . Make sure that the doctor who takes care of your diabetes knows about your planned surgery including the date and location.  How do I manage my blood sugar before surgery? . Check your blood sugar at least 4 times a day, starting 2 days before surgery, to make sure that the level is not too high or low. o Check your blood sugar the morning of your surgery when you wake up and every 2 hours until you get to the Short Stay unit. . If your blood sugar is less than 70 mg/dL, you will need to treat for low blood sugar: o Do not take insulin. o Treat a low blood sugar (less than 70 mg/dL) with  cup of clear juice (cranberry or apple), 4 glucose tablets, OR glucose gel. o Recheck blood sugar in 15 minutes after treatment (to make sure it is greater than 70 mg/dL). If your blood sugar is not greater than 70 mg/dL on recheck, call [phone] for further instructions. . Report your blood sugar to the short stay nurse when you get to Short Stay.  . If you are admitted to the hospital after surgery: o Your blood sugar will be checked by the staff  and you will probably be given insulin after surgery (instead of oral diabetes medicines) to make sure you have good blood sugar levels. o The goal for blood sugar control after surgery is 80-180 mg/dL.   WHAT DO I DO ABOUT MY DIABETES MEDICATION?  Marland Kitchen Do not take oral diabetes medicines (pills) the morning of surgery.  . THE NIGHT BEFORE SURGERY, take  50% of Lantus dose        . The day of surgery, do not take other diabetes injectables, including Byetta (exenatide), Bydureon (exenatide ER), Victoza (liraglutide), or Trulicity (dulaglutide).  . If your CBG is greater than 220 mg/dL, you may take  of your sliding scale  . (correction) dose of insulin.                                   You may not have any metal on your body including hair pins and              piercings  Do not wear jewelry, make-up, lotions, powders or perfumes, deodorant             Do not wear nail polish on your  fingernails.  Do not shave  48 hours prior to surgery.     Do not bring valuables to the hospital. [place].  Contacts, dentures or bridgework may not be worn into surgery.      Patients discharged the day of surgery will not be allowed to drive home. IF YOU ARE HAVING SURGERY AND GOING HOME THE SAME DAY, YOU MUST HAVE AN ADULT TO DRIVE YOU HOME AND BE WITH YOU FOR 24 HOURS. YOU MAY GO HOME BY TAXI OR UBER OR ORTHERWISE, BUT AN ADULT MUST ACCOMPANY YOU HOME AND STAY WITH YOU FOR 24 HOURS.  Name and phone number of your driver:  Special Instructions: N/A              Please read over the following fact sheets you were given: _____________________________________________________________________             [facility] - Preparing for Surgery Before surgery, you can play an important role.  Because skin is not sterile, your skin needs to be as free of germs as possible.  You can reduce the number of germs on your skin by washing with CHG (chlorahexidine  gluconate) soap before surgery.  CHG is an antiseptic cleaner which kills germs and bonds with the skin to continue killing germs even after washing. Please DO NOT use if you have an allergy to CHG or antibacterial soaps.  If your skin becomes reddened/irritated stop using the CHG and inform your nurse when you arrive at Short Stay. Do not shave (including legs and underarms) for at least 48 hours prior to the first CHG shower.  You may shave your face/neck. Please follow these instructions carefully:  1.  Shower with CHG Soap the night before surgery and the  morning of Surgery.  2.  If you choose to wash your hair, wash your hair first as usual with your  normal  shampoo.  3.  After you shampoo, rinse your hair and body thoroughly to remove the  shampoo.                           4.  Use CHG as you would any other liquid soap.  You can apply chg directly  to the skin and wash                       Gently with a scrungie or clean washcloth.  5.  Apply the CHG Soap to your body ONLY FROM THE NECK DOWN.   Do not use on face/ open                           Wound or open sores. Avoid contact with eyes, ears mouth and genitals (private parts).                       Wash face,  Genitals (private parts) with your normal soap.             6.  Wash thoroughly, paying special attention to the area where your surgery  will be performed.  7.  Thoroughly rinse your body with warm water from the neck down.  8.  DO NOT shower/wash with your normal soap after using and rinsing off  the CHG Soap.  9.  Pat yourself dry with a clean towel.            10.  Wear clean pajamas.            11.  Place clean sheets on your bed the night of your first shower and do not  sleep with pets. Day of Surgery : Do not apply any lotions/deodorants the morning of surgery.  Please wear clean clothes to the hospital/surgery center.  FAILURE TO FOLLOW THESE INSTRUCTIONS MAY RESULT IN THE CANCELLATION OF YOUR  SURGERY PATIENT SIGNATURE_________________________________  NURSE SIGNATURE__________________________________  ________________________________________________________________________   Adam Phenix  An incentive spirometer is a tool that can help keep your lungs clear and active. This tool measures how well you are filling your lungs with each breath. Taking long deep breaths may help reverse or decrease the chance of developing breathing (pulmonary) problems (especially infection) following:  A long period of time when you are unable to move or be active. BEFORE THE PROCEDURE   If the spirometer includes an indicator to show your best effort, your nurse or respiratory therapist will set it to a desired goal.  If possible, sit up straight or lean slightly forward. Try not to slouch.  Hold the incentive spirometer in an upright position. INSTRUCTIONS FOR USE  1. Sit on the edge of your bed if possible, or sit up as far as you can in bed or on a chair. 2. Hold the incentive spirometer in an upright position. 3. Breathe out normally. 4. Place the mouthpiece in your mouth and seal your lips tightly around it. 5. Breathe in slowly and as deeply as possible, raising the piston or the ball toward the top of the column. 6. Hold your breath for 3-5 seconds or for as long as possible. Allow the piston or ball to fall to the bottom of the column. 7. Remove the mouthpiece from your mouth and breathe out normally. 8. Rest for a few seconds and repeat Steps 1 through 7 at least 10 times every 1-2 hours when you are awake. Take your time and take a few normal breaths between deep breaths. 9. The spirometer may include an indicator to show your best effort. Use the indicator as a goal to work toward during each repetition. 10. After each set of 10 deep breaths, practice coughing to be sure your lungs are clear. If you have an incision (the cut made at the time of surgery), support your  incision when coughing by placing a pillow or rolled up towels firmly against it. Once you are able to get out of bed, walk around indoors and cough well. You may stop using the incentive spirometer when instructed by your caregiver.  RISKS AND COMPLICATIONS  Take your time so you do not get dizzy or light-headed.  If you are in pain, you may need to take or ask for pain medication before doing incentive spirometry. It is harder to take a deep breath if you are having pain. AFTER USE  Rest and breathe slowly and easily.  It can be helpful to keep track of a log of your progress. Your caregiver can provide you with a simple table to help with this. If you are using the spirometer at home, follow these instructions: [place] IF:   You are having difficultly using the spirometer.  You have trouble using the spirometer as often as instructed.  Your pain medication is not giving enough relief while using the spirometer.  You  develop fever of 100.5 F (38.1 C) or higher. SEEK IMMEDIATE MEDICAL CARE IF:   You cough up bloody sputum that had not been present before.  You develop fever of 102 F (38.9 C) or greater.  You develop worsening pain at or near the incision site. MAKE SURE YOU:   Understand these instructions.  Will watch your condition.  Will get help right away if you are not doing well or get worse. Document Released: 10/23/2006 Document Revised: 09/04/2011 Document Reviewed: 12/24/2006 [facility] Patient Information 2014 [place], [place].   ________________________________________________________________________

## 2020-01-21 NOTE — Telephone Encounter (Signed)
   Primary Cardiologist: Rozann Lesches, MD  Chart reviewed as part of pre-operative protocol coverage. EKG done 01/21/2020 shows no acute changes. Given past medical history and time since last visit and recent phone call 01/20/2020, based on ACC/AHA guidelines, Joyce [name] would be at acceptable risk for the planned procedure without further cardiovascular testing.   Ok to hold aspirin 5 days pre op if needed.  I will route this recommendation to the requesting party via Epic fax function and remove from pre-op pool.  Please call with questions.  Kerin Ransom, PA-C 01/21/2020, 3:29 PM

## 2020-01-21 NOTE — Telephone Encounter (Signed)
EKG nurse visit scheduled today at 10:45 am. Patient informed and verbalized understanding of plan.

## 2020-01-21 NOTE — Telephone Encounter (Signed)
I called the patient to let her know her EKG looked good and that I sent clearance to Dr Damita Dunnings office.  Kerin Ransom PA-C 01/21/2020 3:36 PM

## 2020-01-21 NOTE — Progress Notes (Addendum)
PCP - Allie Dimmer MD  [facility] 12-12-19 lov Cardiologist - Rozann Lesches MD Clearance 01-19-20 epic  PPM/ICD -  Device Orders -  Rep Notified -   Chest x-ray -  EKG - 01-21-20 epic Stress Test -  ECHO - 2019 epic Cardiac Cath - 2019 epic  Sleep Study -  CPAP -   Fasting Blood Sugar -90- 120 Checks Blood Sugar dexacom meter   Blood Thinner Instructions:Plavix hold 5 days  Aspirin Instructions:  ERAS Protcol - PRE-SURGERY  G2-   COVID TEST- 8-5  Activity- SOB with activity of any distance , Lives alone does own housework without SOB   Anesthesia review: Cardiac ,MI, CABG,stents COPD. CKD3, DM , HTN   Patient denies shortness of breath, fever, cough and chest pain at PAT appointment  none   All instructions explained to the patient, with a verbal understanding of the material. Patient agrees to go over the instructions while at home for a better understanding. Patient also instructed to self quarantine after being tested for COVID-19. The opportunity to ask questions was provided.

## 2020-01-21 NOTE — Progress Notes (Signed)
Present to office for EKG per pre-op clearance request.  EKG sent to provider for review

## 2020-01-22 ENCOUNTER — Encounter (HOSPITAL_COMMUNITY): Payer: Self-pay

## 2020-01-22 ENCOUNTER — Encounter (HOSPITAL_COMMUNITY)
Admission: RE | Admit: 2020-01-22 | Discharge: 2020-01-22 | Disposition: A | Discharge status: Home / Self Care | Payer: Medicare Other | Source: Ambulatory Visit | Attending: Orthopedic Surgery | Admitting: Orthopedic Surgery

## 2020-01-22 ENCOUNTER — Ambulatory Visit (HOSPITAL_COMMUNITY)
Admission: RE | Admit: 2020-01-22 | Discharge: 2020-01-22 | Disposition: A | Discharge status: Home / Self Care | Payer: Medicare Other | Source: Ambulatory Visit | Attending: Orthopedic Surgery | Admitting: Orthopedic Surgery

## 2020-01-22 ENCOUNTER — Telehealth: Payer: Self-pay | Admitting: Emergency Medicine

## 2020-01-22 ENCOUNTER — Other Ambulatory Visit: Payer: Self-pay

## 2020-01-22 DIAGNOSIS — N183 Chronic kidney disease, stage 3 unspecified: Secondary | ICD-10-CM | POA: Insufficient documentation

## 2020-01-22 DIAGNOSIS — J449 Chronic obstructive pulmonary disease, unspecified: Secondary | ICD-10-CM | POA: Diagnosis not present

## 2020-01-22 DIAGNOSIS — E1122 Type 2 diabetes mellitus with diabetic chronic kidney disease: Secondary | ICD-10-CM | POA: Diagnosis not present

## 2020-01-22 DIAGNOSIS — Z01818 Encounter for other preprocedural examination: Secondary | ICD-10-CM | POA: Insufficient documentation

## 2020-01-22 DIAGNOSIS — I129 Hypertensive chronic kidney disease with stage 1 through stage 4 chronic kidney disease, or unspecified chronic kidney disease: Secondary | ICD-10-CM | POA: Insufficient documentation

## 2020-01-22 DIAGNOSIS — M1611 Unilateral primary osteoarthritis, right hip: Secondary | ICD-10-CM | POA: Diagnosis not present

## 2020-01-22 HISTORY — DX: Family history of other specified conditions: Z84.89

## 2020-01-22 HISTORY — DX: Chronic obstructive pulmonary disease, unspecified: J44.9

## 2020-01-22 HISTORY — DX: Unspecified asthma, uncomplicated: J45.909

## 2020-01-22 LAB — PROTIME-INR
INR: 1 (ref 0.8–1.2)
Prothrombin Time: 12.8 seconds (ref 11.4–15.2)

## 2020-01-22 LAB — URINALYSIS, ROUTINE W REFLEX MICROSCOPIC
Bacteria, UA: NONE SEEN
Bilirubin Urine: NEGATIVE
Glucose, UA: 50 mg/dL — AB
Hgb urine dipstick: NEGATIVE
Ketones, ur: NEGATIVE mg/dL
Leukocytes,Ua: NEGATIVE
Nitrite: NEGATIVE
Protein, ur: 100 mg/dL — AB
Specific Gravity, Urine: 1.008 (ref 1.005–1.030)
pH: 5 (ref 5.0–8.0)

## 2020-01-22 LAB — BASIC METABOLIC PANEL
Anion gap: 9 (ref 5–15)
BUN: 44 mg/dL — ABNORMAL HIGH (ref 8–23)
CO2: 21 mmol/L — ABNORMAL LOW (ref 22–32)
Calcium: 8.7 mg/dL — ABNORMAL LOW (ref 8.9–10.3)
Chloride: 106 mmol/L (ref 98–111)
Creatinine, Ser: 2.07 mg/dL — ABNORMAL HIGH (ref 0.44–1.00)
GFR calc Af Amer: 26 mL/min — ABNORMAL LOW (ref >60)
GFR calc non Af Amer: 23 mL/min — ABNORMAL LOW (ref >60)
Glucose, Bld: 103 mg/dL — ABNORMAL HIGH (ref 70–99)
Potassium: 4.5 mmol/L (ref 3.5–5.1)
Sodium: 136 mmol/L (ref 135–145)

## 2020-01-22 LAB — CBC WITH DIFFERENTIAL/PLATELET
Abs Immature Granulocytes: 0.02 10*3/uL (ref 0.00–0.07)
Basophils Absolute: 0 10*3/uL (ref 0.0–0.1)
Basophils Relative: 0 %
Eosinophils Absolute: 0.1 10*3/uL (ref 0.0–0.5)
Eosinophils Relative: 1 %
HCT: 32.9 % — ABNORMAL LOW (ref 36.0–46.0)
Hemoglobin: 10.5 g/dL — ABNORMAL LOW (ref 12.0–15.0)
Immature Granulocytes: 0 %
Lymphocytes Relative: 30 %
Lymphs Abs: 1.6 10*3/uL (ref 0.7–4.0)
MCH: 29 pg (ref 26.0–34.0)
MCHC: 31.9 g/dL (ref 30.0–36.0)
MCV: 90.9 fL (ref 80.0–100.0)
Monocytes Absolute: 0.4 10*3/uL (ref 0.1–1.0)
Monocytes Relative: 8 %
Neutro Abs: 3.1 10*3/uL (ref 1.7–7.7)
Neutrophils Relative %: 61 %
Platelets: 170 10*3/uL (ref 150–400)
RBC: 3.62 MIL/uL — ABNORMAL LOW (ref 3.87–5.11)
RDW: 13 % (ref 11.5–15.5)
WBC: 5.2 10*3/uL (ref 4.0–10.5)
nRBC: 0 % (ref 0.0–0.2)

## 2020-01-22 LAB — APTT: aPTT: 26 seconds (ref 24–36)

## 2020-01-22 LAB — HEMOGLOBIN A1C
Hgb A1c MFr Bld: 5.3 % (ref 4.8–5.6)
Mean Plasma Glucose: 105.41 mg/dL

## 2020-01-22 LAB — SURGICAL PCR SCREEN
MRSA, PCR: NEGATIVE
Staphylococcus aureus: NEGATIVE

## 2020-01-22 LAB — GLUCOSE, CAPILLARY: Glucose-Capillary: 111 mg/dL — ABNORMAL HIGH (ref 70–99)

## 2020-01-22 NOTE — Telephone Encounter (Signed)
No form received  LMTCB for Joyce [name]

## 2020-01-23 NOTE — Progress Notes (Signed)
Anesthesia Chart Review   Case: [id number] Date/Time: 02/02/20 0830   Procedure: RIGHT TOTAL HIP ARTHROPLASTY ANTERIOR APPROACH (Right Hip)   Anesthesia type: Spinal   Pre-op diagnosis: RIGHT HIP OSTEOARTHITIS   Location: [place] 08 / [facility]   Surgeons: Frederik Pear, MD      DISCUSSION:79 y.o. never smoker with h/o HTN, HLD, CKD Stage III creatinine stable, COPD, DM II, right hip OA scheduled for above procedure 02/02/2020 with Dr. Frederik Pear.   Per cardiology preoperative risk assessment 01/21/2020, "Chart reviewed as part of pre-operative protocol coverage. EKG done 01/21/2020 shows no acute changes. Given past medical history and time since last visit and recent phone call 01/20/2020, based on ACC/AHA guidelines, Joyce [name] would be at acceptable risk for the planned procedure without further cardiovascular testing.  Ok to hold aspirin 5 days pre op if needed" VS: BP (!) 156/50   Pulse 61   Temp 37 C (Oral)   Resp 18   Ht 5\' 5"  (1.651 m)   Wt 91.2 kg   SpO2 99%   BMI 33.47 kg/m   PROVIDERS: Allie Dimmer, MD is PCP   Rozann Lesches, MD is Cardiologist  LABS: Labs reviewed: Acceptable for surgery. (all labs ordered are listed, but only abnormal results are displayed)  Labs Reviewed  BASIC METABOLIC PANEL - Abnormal; Notable for the following components:      Result Value   CO2 21 (*)    Glucose, Bld 103 (*)    BUN 44 (*)    Creatinine, Ser 2.07 (*)    Calcium 8.7 (*)    GFR calc non Af Amer 23 (*)    GFR calc Af Amer 26 (*)    All other components within normal limits  CBC WITH DIFFERENTIAL/PLATELET - Abnormal; Notable for the following components:   RBC 3.62 (*)    Hemoglobin 10.5 (*)    HCT 32.9 (*)    All other components within normal limits  URINALYSIS, ROUTINE W REFLEX MICROSCOPIC - Abnormal; Notable for the following components:   Color, Urine STRAW (*)    Glucose, UA 50 (*)    Protein, ur 100 (*)    All other components within normal limits   GLUCOSE, CAPILLARY - Abnormal; Notable for the following components:   Glucose-Capillary 111 (*)    All other components within normal limits  SURGICAL PCR SCREEN  APTT  PROTIME-INR  HEMOGLOBIN A1C  TYPE AND SCREEN     IMAGES:   EKG: 01/21/2020 Rate 66 bpm  Sinus rhythm    CV: Echo 05/19/2018 Study Conclusions   - Left ventricle: The cavity size was normal. Wall thickness was  increased in a pattern of moderate LVH. Systolic function was  normal. The estimated ejection fraction was in the range of 60%  to 65%. Possible basal inferior hypokinesis. Doppler parameters  are consistent with abnormal left ventricular relaxation (grade 1  diastolic dysfunction). The E/e&' ratio is between 8-15,  suggesting indeterminate LV filling pressure.  - Mitral valve: Mildly thickened leaflets . There was mild  regurgitation.  - Left atrium: The atrium was mildly dilated.  - Right atrium: The atrium was mildly dilated.  - Inferior vena cava: The vessel was normal in size. The  respirophasic diameter changes were in the normal range (= 50%),  consistent with normal central venous pressure.   Impressions:   - Compared to a prior study in 2018, the LVEF is lower at 60-65%  with moderate LVH and mild biatrial  enlargment. There is basal  inferior hypokinesis.  Past Medical History:  Diagnosis Date  . Anemia   . Arthritis   . Asthma   . Bilateral pleural effusion    Postoperative, status post Pleurx catheter and talc treatment - Dr. Prescott Gum  . 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 Chronic kidney disease (CKD), stage III (moderate)   . Chronic lower back pain   . COPD (chronic obstructive pulmonary disease) ([place])    pt. denies  . Essential hypertension   . Family history of adverse reaction to anesthesia    Mom was slow to wake up  . Fibromyalgia    neuropathy feet  and fingers  . History of pneumonia   . Hyperlipidemia   . Myocardial infarction ([place])   . Type II diabetes mellitus ([place])     Past Surgical History:  Procedure Laterality Date  . ABDOMINAL HYSTERECTOMY  1980's  . ANTERIOR CERVICAL DECOMP/DISCECTOMY FUSION  2000's  . APPENDECTOMY  1970's  . BACK SURGERY    . BIOPSY  08/28/2019   Procedure: BIOPSY;  Surgeon: Rogene Houston, MD;  [facility];  Service: Endoscopy;;  duodenal  . CHEST TUBE INSERTION Left 08/07/2014   Procedure: INSERTION PLEURAL DRAINAGE CATHETER;  Surgeon: Ivin Poot, MD;  Location: [place];  Service: Thoracic;  Laterality: Left;  . CHEST TUBE INSERTION Right 08/12/2014   Procedure: INSERTION PLEURAL DRAINAGE CATHETER;  Surgeon: Ivin Poot, MD;  Location: [place];  Service: Thoracic;  Laterality: Right;  . CHOLECYSTECTOMY  ~ 2005  . COLONOSCOPY N/A 08/28/2019   Procedure: COLONOSCOPY;  Surgeon: Rogene Houston, MD;  [facility];  Service: Endoscopy;  Laterality: N/A;  100  . CORONARY ARTERY BYPASS GRAFT N/A 06/15/2014   Procedure: CORONARY ARTERY BYPASS GRAFTING (CABG);  Surgeon: Ivin Poot, MD;  Location: [place];  Service: Open Heart Surgery;  Laterality: N/A;  . CORONARY ATHERECTOMY N/A 05/22/2018   Procedure: CORONARY ATHERECTOMY;  Surgeon: Belva Crome, MD;  Location: [place] CV LAB;  Service: Cardiovascular;  Laterality: N/A;  . CORONARY STENT INTERVENTION N/A 05/22/2018   Procedure: CORONARY STENT INTERVENTION;  Surgeon: Belva Crome, MD;  Location: [place] CV LAB;  Service: Cardiovascular;  Laterality: N/A;  rca   . ESOPHAGOGASTRODUODENOSCOPY N/A 08/28/2019   Procedure: ESOPHAGOGASTRODUODENOSCOPY (EGD);  Surgeon: Rogene Houston, MD;  [facility];  Service: Endoscopy;  Laterality: N/A;  . INTRAOPERATIVE TRANSESOPHAGEAL ECHOCARDIOGRAM N/A 06/15/2014   Procedure: INTRAOPERATIVE TRANSESOPHAGEAL ECHOCARDIOGRAM;  Surgeon: Ivin Poot, MD;  Location: [place];   Service: Open Heart Surgery;  Laterality: N/A;  . LEFT HEART CATH AND CORS/GRAFTS ANGIOGRAPHY N/A 05/20/2018   Procedure: LEFT HEART CATH AND CORS/GRAFTS ANGIOGRAPHY;  Surgeon: Lorretta Harp, MD;  Location: [place] CV LAB;  Service: Cardiovascular;  Laterality: N/A;  . LEFT HEART CATHETERIZATION WITH CORONARY ANGIOGRAM N/A 06/11/2014   Procedure: LEFT HEART CATHETERIZATION WITH CORONARY ANGIOGRAM;  Surgeon: Blane Ohara, MD;  Location: [facility] CATH LAB;  Service: Cardiovascular;  Laterality: N/A;  . LUMBAR DISC SURGERY  1980's X 1; 1990's X  2000's X 1  . POLYPECTOMY  08/28/2019   Procedure: POLYPECTOMY;  Surgeon: Rogene Houston, MD;  [facility];  Service: Endoscopy;;  . REMOVAL OF PLEURAL DRAINAGE CATHETER Bilateral 02/11/2015   Procedure: REMOVAL OF PLEURAL DRAINAGE CATHETER;  Surgeon: Ivin Poot, MD;  Location: [place];  Service: Thoracic;  Laterality: Bilateral;  . TALC PLEURODESIS Bilateral 02/11/2015   Procedure: Pietro Cassis;  Surgeon: Ivin Poot, MD;  Location: [place];  Service: Thoracic;  Laterality: Bilateral;  . TONSILLECTOMY  ~ 1950    MEDICATIONS: . ACCU-CHEK AVIVA PLUS test strip  . acetaminophen (TYLENOL) 325 MG tablet  . albuterol (VENTOLIN HFA) 108 (90 Base) MCG/ACT inhaler  . amLODipine (NORVASC) 10 MG tablet  . atorvastatin (LIPITOR) 40 MG tablet  . busPIRone (BUSPAR) 5 MG tablet  . calcitRIOL (ROCALTROL) 0.25 MCG capsule  . Cholecalciferol (VITAMIN D3) 2000 units TABS  . clopidogrel (PLAVIX) 75 MG tablet  . FEROSUL 325 (65 Fe) MG tablet  . fluticasone (FLONASE) 50 MCG/ACT nasal spray  . furosemide (LASIX) 40 MG tablet  . GLUCERNA (GLUCERNA) LIQD  . hydrALAZINE (APRESOLINE) 100 MG tablet  . insulin glargine (LANTUS) 100 UNIT/ML injection  . insulin lispro (HUMALOG) 100 UNIT/ML KiwkPen  . Insulin Syringe-Needle U-100 (INSULIN SYRINGE .5CC/30GX5/16") 30G X 5/16" 0.5 ML MISC  . isosorbide mononitrate (IMDUR) 60 MG 24 hr tablet  .  linaclotide (LINZESS) 145 MCG CAPS capsule  . lisinopril (PRINIVIL,ZESTRIL) 5 MG tablet  . montelukast (SINGULAIR) 10 MG tablet  . Multiple Vitamin (MULTIVITAMIN WITH MINERALS) TABS tablet  . naloxone (NARCAN) nasal spray 4 mg/0.1 mL  . nitroGLYCERIN (NITROSTAT) 0.4 MG SL tablet  . polyethylene glycol (MIRALAX / GLYCOLAX) 17 g packet  . primidone (MYSOLINE) 50 MG tablet  . Probiotic Product (PROBIOTIC DAILY PO)  . psyllium (METAMUCIL) 58.6 % packet  . rOPINIRole (REQUIP) 1 MG tablet  . Tiotropium Bromide-Olodaterol (STIOLTO RESPIMAT) 2.5-2.5 MCG/ACT AERS  . traMADol (ULTRAM) 50 MG tablet   No current facility-administered medications for this encounter.     Konrad Felix, PA-C [facility] Pre-Surgical Testing [phone]

## 2020-01-23 NOTE — Telephone Encounter (Signed)
Wells Guiles returning call.  Made her aware that no form had been received.  Will refax form again.  Confirmed fax # with Wells Guiles.

## 2020-01-23 NOTE — Anesthesia Preprocedure Evaluation (Addendum)
Anesthesia Evaluation  Patient identified by MRN, date of birth, ID band  Airway Mallampati: II  TM Distance: <3 FB Neck ROM: Limited    Dental no notable dental hx.    Pulmonary shortness of breath, asthma , COPD,    Pulmonary exam normal breath sounds clear to auscultation       Cardiovascular hypertension, + angina + CAD (h/o CABG on Plavix (last dose 01/28/20)), + Past MI and +CHF  Normal cardiovascular exam Rhythm:Regular Rate:Normal     Neuro/Psych  Neuromuscular disease (fibromyalgia)    GI/Hepatic   Endo/Other  diabetes, Type 2  Renal/GU CRFRenal disease     Musculoskeletal  (+) Arthritis , Fibromyalgia -  Abdominal (+) + obese,   Peds negative pediatric ROS (+)  Hematology  (+) anemia ,   Anesthesia Other Findings   Reproductive/Obstetrics                           Anesthesia Physical Anesthesia Plan  ASA: III  Anesthesia Plan: General   Post-op Pain Management:    Induction: Intravenous  PONV Risk Score and Plan: 2 and Ondansetron and Dexamethasone  Airway Management Planned: Video Laryngoscope Planned  Additional Equipment:   Intra-op Plan:   Post-operative Plan:   Informed Consent: I have reviewed the patients History and Physical, chart, labs and discussed the procedure including the risks, benefits and alternatives for the proposed anesthesia with the patient or authorized representative who has indicated his/her understanding and acceptance.     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 Glidescope for intubation. )      Anesthesia Quick Evaluation

## 2020-01-26 NOTE — Telephone Encounter (Signed)
I spoke with the patient reviewed her status, her medications.  She is had no flares.  She has minimal symptoms.  Rarely uses albuterol.  Stable on Stiolto.  Based on her clinical status, her PFT she is low to moderate risk for general anesthesia.

## 2020-01-26 NOTE — Telephone Encounter (Signed)
Attempted to call the patient to discuss since I haven't seen her in a year. No answer, left her a message. Will try her again

## 2020-01-26 NOTE — Telephone Encounter (Signed)
Fax has arrived, message to Dr. Lamonte Sakai sent because she has only ever seen him in our office. Waiting for his response.

## 2020-01-26 NOTE — Telephone Encounter (Signed)
Dr. Lamonte Sakai contacted and he will address later today. He has the fax in his look at box.

## 2020-01-26 NOTE — Telephone Encounter (Signed)
Form completed and will be faxed.

## 2020-01-29 ENCOUNTER — Other Ambulatory Visit (HOSPITAL_COMMUNITY)
Admission: RE | Admit: 2020-01-29 | Discharge: 2020-01-29 | Disposition: A | Discharge status: Home / Self Care | Payer: Medicare Other | Source: Ambulatory Visit | Attending: Orthopedic Surgery | Admitting: Orthopedic Surgery

## 2020-01-29 DIAGNOSIS — Z20822 Contact with and (suspected) exposure to covid-19: Secondary | ICD-10-CM | POA: Insufficient documentation

## 2020-01-29 DIAGNOSIS — Z01812 Encounter for preprocedural laboratory examination: Secondary | ICD-10-CM | POA: Diagnosis present

## 2020-01-29 LAB — SARS CORONAVIRUS 2 (TAT 6-24 HRS): SARS Coronavirus 2: NEGATIVE

## 2020-01-30 DIAGNOSIS — M1611 Unilateral primary osteoarthritis, right hip: Secondary | ICD-10-CM | POA: Diagnosis present

## 2020-01-30 NOTE — H&P (Signed)
TOTAL HIP ADMISSION H&P  Patient is admitted for right total hip arthroplasty.  Subjective:  Chief Complaint: right hip pain  HPI: Joyce [name], 79 y.o. female, has a history of pain and functional disability in the right hip(s) due to arthritis and patient has failed non-surgical conservative treatments for greater than 12 weeks to include NSAID's and/or analgesics, corticosteriod injections, use of assistive devices, weight reduction as appropriate and activity modification.  Onset of symptoms was gradual starting 1 years ago with gradually worsening course since that time.The patient noted no past surgery on the right hip(s).  Patient currently rates pain in the right hip at 10 out of 10 with activity. Patient has night pain, worsening of pain with activity and weight bearing, trendelenberg gait, pain that interfers with activities of daily living and pain with passive range of motion. Patient has evidence of subchondral cysts, periarticular osteophytes and joint space narrowing by imaging studies. This condition presents safety issues increasing the risk of falls.  There is no current active infection.  Patient Active Problem List   Diagnosis Date Noted  . 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 Femoral artery pseudo-aneurysm, left ([place]) 05/23/2018  . Acute on chronic blood loss anemia 05/23/2018  . Coronary artery disease involving native coronary artery of native heart with unstable angina pectoris ([place]) 05/23/2018  . S/P right coronary artery (RCA) stent placement 05/23/2018  . COPD with asthma ([place]) 07/17/2017  . Acute bacterial rhinosinusitis 08/13/2014  . Bilateral pleural effusion 08/04/2014  . Dyspnea   . Leukopenia 07/28/2014  . Normocytic anemia 07/27/2014  . Exudative pleural effusion   . Diastolic CHF, chronic ([place]) 07/26/2014  . 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 NSTEMI (non-ST elevated myocardial infarction) ([place]) 06/11/2014  . Chronic kidney disease (CKD), stage III  (moderate) ([place]) 06/11/2014  . Essential hypertension 06/11/2014  . Diabetes mellitus type 2 in obese ([place]) 06/11/2014   Past Medical History:  Diagnosis Date  . Anemia   . Arthritis   . Asthma   . Bilateral pleural effusion    Postoperative, status post Pleurx catheter and talc treatment - Dr. Prescott Gum  . 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 Chronic kidney disease (CKD), stage III (moderate)   . Chronic lower back pain   . COPD (chronic obstructive pulmonary disease) ([place])    pt. denies  . Essential hypertension   . Family history of adverse reaction to anesthesia    Mom was slow to wake up  . Fibromyalgia    neuropathy feet and fingers  . History of pneumonia   . Hyperlipidemia   . Myocardial infarction ([place])   . Type II diabetes mellitus ([place])     Past Surgical History:  Procedure Laterality Date  . ABDOMINAL HYSTERECTOMY  1980's  . ANTERIOR CERVICAL DECOMP/DISCECTOMY FUSION  2000's  . APPENDECTOMY  1970's  . BACK SURGERY    . BIOPSY  08/28/2019   Procedure: BIOPSY;  Surgeon: Rogene Houston, MD;  [facility];  Service: Endoscopy;;  duodenal  . CHEST TUBE INSERTION Left 08/07/2014   Procedure: INSERTION PLEURAL DRAINAGE CATHETER;  Surgeon: Ivin Poot, MD;  Location: [place];  Service: Thoracic;  Laterality: Left;  . CHEST TUBE INSERTION Right 08/12/2014   Procedure: INSERTION PLEURAL DRAINAGE CATHETER;  Surgeon: Ivin Poot, MD;  Location: [place];  Service: Thoracic;  Laterality: Right;  . CHOLECYSTECTOMY  ~ 2005  . COLONOSCOPY N/A 08/28/2019   Procedure: COLONOSCOPY;  Surgeon: Rogene Houston, MD;  [facility];  Service: Endoscopy;  Laterality: N/A;  100  . CORONARY ARTERY BYPASS GRAFT N/A 06/15/2014   Procedure: CORONARY ARTERY BYPASS GRAFTING (CABG);  Surgeon: Ivin Poot, MD;  Location: [place];  Service: Open Heart Surgery;  Laterality: N/A;  .  CORONARY ATHERECTOMY N/A 05/22/2018   Procedure: CORONARY ATHERECTOMY;  Surgeon: Belva Crome, MD;  Location: [facility];  Service: Cardiovascular;  Laterality: N/A;  . CORONARY STENT INTERVENTION N/A 05/22/2018   Procedure: CORONARY STENT INTERVENTION;  Surgeon: Belva Crome, MD;  Location: [facility];  Service: Cardiovascular;  Laterality: N/A;  rca   . ESOPHAGOGASTRODUODENOSCOPY N/A 08/28/2019   Procedure: ESOPHAGOGASTRODUODENOSCOPY (EGD);  Surgeon: Rogene Houston, MD;  [facility];  Service: Endoscopy;  Laterality: N/A;  . INTRAOPERATIVE TRANSESOPHAGEAL ECHOCARDIOGRAM N/A 06/15/2014   Procedure: INTRAOPERATIVE TRANSESOPHAGEAL ECHOCARDIOGRAM;  Surgeon: Ivin Poot, MD;  Location: [place];  Service: Open Heart Surgery;  Laterality: N/A;  . LEFT HEART CATH AND CORS/GRAFTS ANGIOGRAPHY N/A 05/20/2018   Procedure: LEFT HEART CATH AND CORS/GRAFTS ANGIOGRAPHY;  Surgeon: Lorretta Harp, MD;  Location: [facility];  Service: Cardiovascular;  Laterality: N/A;  . LEFT HEART CATHETERIZATION WITH CORONARY ANGIOGRAM N/A 06/11/2014   Procedure: LEFT HEART CATHETERIZATION WITH CORONARY ANGIOGRAM;  Surgeon: Blane Ohara, MD;  Location: [facility];  Service: Cardiovascular;  Laterality: N/A;  . LUMBAR DISC SURGERY  1980's X 1; 1990's X  2000's X 1  . POLYPECTOMY  08/28/2019   Procedure: POLYPECTOMY;  Surgeon: Rogene Houston, MD;  [facility];  Service: Endoscopy;;  . REMOVAL OF PLEURAL DRAINAGE CATHETER Bilateral 02/11/2015   Procedure: REMOVAL OF PLEURAL DRAINAGE CATHETER;  Surgeon: Ivin Poot, MD;  Location: [facility];  Service: Thoracic;  Laterality: Bilateral;  . TALC PLEURODESIS Bilateral 02/11/2015   Procedure: Pietro Cassis;  Surgeon: Ivin Poot, MD;  Location: [place];  Service: Thoracic;  Laterality: Bilateral;  . TONSILLECTOMY  ~ 1950    No current facility-administered medications for this encounter.   Current Outpatient  Medications  Medication Sig Dispense Refill Last Dose  . acetaminophen (TYLENOL) 325 MG tablet Take 650 mg by mouth every 4 (four) hours as needed for moderate pain.     Marland Kitchen albuterol (VENTOLIN HFA) 108 (90 Base) MCG/ACT inhaler Inhale 2 puffs into the lungs every 4 (four) hours as needed for wheezing or shortness of breath. 8 g 5   . amLODipine (NORVASC) 10 MG tablet Take 10 mg by mouth daily.   5   . atorvastatin (LIPITOR) 40 MG tablet Take 40 mg by mouth every evening.      . busPIRone (BUSPAR) 5 MG tablet Take 5 mg by mouth daily as needed (anxiety).     . calcitRIOL (ROCALTROL) 0.25 MCG capsule Take 0.25 mcg by mouth every Monday, Wednesday, and Friday.     . Cholecalciferol (VITAMIN D3) 2000 units TABS Take 4,000-6,000 Units by mouth See admin instructions. Take 4000 units by mouth every other day alternating with 6000 units on alternate days     . clopidogrel (PLAVIX) 75 MG tablet TAKE ONE TABLET BY MOUTH DAILY WITH BREAKFAST (Patient taking differently: Take 75 mg by mouth daily. ) 90 tablet 0   . FEROSUL 325 (65 Fe) MG tablet TAKE ONE TABLET BY MOUTH DAILY (Patient  taking differently: Take 325 mg by mouth every Monday, Wednesday, and Friday. ) 60 tablet 5   . fluticasone (FLONASE) 50 MCG/ACT nasal spray Place 1 spray into both nostrils daily.      . furosemide (LASIX) 40 MG tablet Take 40 mg by mouth daily.     . hydrALAZINE (APRESOLINE) 100 MG tablet Take 100 mg by mouth 3 (three) times daily.     . insulin glargine (LANTUS) 100 UNIT/ML injection Inject 40 Units into the skin at bedtime.      . insulin lispro (HUMALOG) 100 UNIT/ML KiwkPen Inject 8 Units into the skin 3 (three) times daily with meals.      . isosorbide mononitrate (IMDUR) 60 MG 24 hr tablet TAKE ONE TABLET BY MOUTH DAILY (Patient taking differently: Take 60 mg by mouth daily. ) 90 tablet 2   . lisinopril (PRINIVIL,ZESTRIL) 5 MG tablet Take 5 mg by mouth every evening.      . montelukast (SINGULAIR) 10 MG tablet Take 10 mg by  mouth at bedtime.     . Multiple Vitamin (MULTIVITAMIN WITH MINERALS) TABS tablet Take 1 tablet by mouth daily.     . nitroGLYCERIN (NITROSTAT) 0.4 MG SL tablet Place 1 tablet (0.4 mg total) under the tongue every 5 (five) minutes x 3 doses as needed for chest pain. 25 tablet 3   . polyethylene glycol (MIRALAX / GLYCOLAX) 17 g packet Take 17 g by mouth daily as needed for moderate constipation.     . primidone (MYSOLINE) 50 MG tablet Take 100 mg by mouth daily.   5   . rOPINIRole (REQUIP) 1 MG tablet Take 1 mg by mouth 3 (three) times daily as needed (muscle spasms).     . Tiotropium Bromide-Olodaterol (STIOLTO RESPIMAT) 2.5-2.5 MCG/ACT AERS Take 2 puffs by mouth daily. 4 g 11   . traMADol (ULTRAM) 50 MG tablet Take 100 mg by mouth every 8 (eight) hours as needed for moderate pain.      Marland Kitchen ACCU-CHEK AVIVA PLUS test strip Inject 1 strip as directed 4 (four) times daily.  2   . GLUCERNA (GLUCERNA) LIQD Take 237 mLs by mouth daily as needed (nutritional support).  (Patient not taking: Reported on 01/13/2020)   Not Taking at Unknown time  . Insulin Syringe-Needle U-100 (INSULIN SYRINGE .5CC/30GX5/16") 30G X 5/16" 0.5 ML MISC Inject 0.5 mLs as directed 3 (three) times daily.  3   . linaclotide (LINZESS) 145 MCG CAPS capsule Take 1 capsule (145 mcg total) by mouth daily before breakfast. Take 30 min before breakfast (Patient not taking: Reported on 01/13/2020) 30 capsule 3 Not Taking at Unknown time  . naloxone (NARCAN) nasal spray 4 mg/0.1 mL Place 1 spray into the nose daily as needed (accidental overdose.). Use as directed.  (Patient not taking: Reported on 01/13/2020)   Not Taking at Unknown time  . Probiotic Product (PROBIOTIC DAILY PO) Take 1 capsule by mouth in the morning, at noon, and at bedtime.  (Patient not taking: Reported on 01/13/2020)   Not Taking at Unknown time  . psyllium (METAMUCIL) 58.6 % packet Take by mouth daily. (Patient not taking: Reported on 01/13/2020)   Not Taking at Unknown time    Allergies  Allergen Reactions  . Carvedilol Other (See Comments)    confusion  . Codeine Other (See Comments)    confusion  . Metoprolol Palpitations    Social History   Tobacco Use  . Smoking status: Never Smoker  . Smokeless tobacco: Never Used  Substance Use Topics  . Alcohol use: No    Alcohol/week: 0.0 standard drinks    Family History  Problem Relation Age of Onset  . Stroke Father   . Diabetes Mother   . Hypertension Mother   . Alcoholism Brother   . Gout Brother   . Arthritis Brother   . Alcoholism Brother   . Heart disease Sister   . Heart attack Sister   . Cancer Sister        hodkins lymphoma  . CAD Paternal Grandmother        heart attack  . CAD Paternal Uncle        heart attack  . Heart attack Paternal Grandfather   . Osteoporosis Sister      Review of Systems  Constitutional: Positive for fatigue.  HENT: Positive for hearing loss and sinus pain.   Respiratory: Positive for shortness of breath.   Cardiovascular: Positive for leg swelling.  Gastrointestinal: Positive for constipation.  Endocrine: Negative.   Genitourinary: Positive for frequency and urgency.  Musculoskeletal: Positive for arthralgias.  Allergic/Immunologic: Negative.   Neurological: Positive for tremors.  Hematological: Bruises/bleeds easily.  Psychiatric/Behavioral: Negative.     Objective:  Physical Exam Constitutional:      Appearance: Normal appearance. She is obese.  HENT:     Head: Normocephalic and atraumatic.  Eyes:     Pupils: Pupils are equal, round, and reactive to light.  Cardiovascular:     Pulses: Normal pulses.  Pulmonary:     Effort: Pulmonary effort is normal.  Musculoskeletal:     Cervical back: Normal range of motion and neck supple.     Comments: Skin over the right hip is in good condition no cuts or scrapes or abrasions.  Internal rotation to 10 reproduces her pain external rotation less so.  Foot tap is negative.  Skin:    General: Skin is  warm and dry.  Neurological:     General: No focal deficit present.     Mental Status: She is alert and oriented to person, place, and time. Mental status is at baseline.  Psychiatric:        Mood and Affect: Mood normal.        Behavior: Behavior normal.        Thought Content: Thought content normal.        Judgment: Judgment normal.     Vital signs in last 24 hours:    Labs:   Estimated body mass index is 33.47 kg/m as calculated from the following:   Height as of 01/22/20: 5\' 5"  (1.651 m).   Weight as of 01/22/20: 91.2 kg.   Imaging Review Plain radiographs demonstrate AP pelvis and crosstable lateral show end-stage medial pole arthritis of the right hip peripheral osteophytes and subchondral cysts.  The metaphyseal diaphyseal region which was not visible in her old x-rays appears to have fairly normal anatomy and she will be amenable to standard joint replacement components.    Assessment/Plan:  End stage arthritis, right hip(s)  The patient history, physical examination, clinical judgement of the provider and imaging studies are consistent with end stage degenerative joint disease of the right hip(s) and total hip arthroplasty is deemed medically necessary. The treatment options including medical management, injection therapy, arthroscopy and arthroplasty were discussed at length. The risks and benefits of total hip arthroplasty were presented and reviewed. The risks due to aseptic loosening, infection, stiffness, dislocation/subluxation,  thromboembolic complications and other imponderables were discussed.  The patient acknowledged the  explanation, agreed to proceed with the plan and consent was signed. Patient is being admitted for inpatient treatment for surgery, pain control, PT, OT, prophylactic antibiotics, VTE prophylaxis, progressive ambulation and ADL's and discharge planning.The patient is planning to be discharged home with home health services   Anticipated LOS  equal to or greater than 2 midnights due to - Age 6 and older with one or more of the following:  - Obesity  - Expected need for hospital services (PT, OT, Nursing) required for safe  discharge  - Anticipated need for postoperative skilled nursing care or inpatient rehab  - Active co-morbidities: Respiratory Failure/COPD and CKD

## 2020-01-30 NOTE — Progress Notes (Signed)
Pt advised that her 02-02-20 surgery time has changed from 8:45 AM to 1:45 PM. Pt to arrive to Admitting at 11:15 AM, and to complete her G2 supplement by 10:45 AM.Pt questioned if she can take her Tramadol the day of surgery, and was advised that she could. Pt verbalized understanding of the above.

## 2020-02-01 MED ORDER — BUPIVACAINE LIPOSOME 1.3 % IJ SUSP
10.0000 mL | Freq: Once | INTRAMUSCULAR | Status: DC
  Filled 2020-02-01: qty 10

## 2020-02-01 MED ORDER — TRANEXAMIC ACID 1000 MG/10ML IV SOLN
2000.0000 mg | INTRAVENOUS | Status: DC
  Filled 2020-02-01: qty 20

## 2020-02-02 ENCOUNTER — Ambulatory Visit (HOSPITAL_COMMUNITY): Payer: Medicare Other | Admitting: Physician Assistant

## 2020-02-02 ENCOUNTER — Ambulatory Visit (HOSPITAL_COMMUNITY): Payer: Medicare Other

## 2020-02-02 ENCOUNTER — Ambulatory Visit (HOSPITAL_COMMUNITY): Payer: Medicare Other | Admitting: Anesthesiology

## 2020-02-02 ENCOUNTER — Inpatient Hospital Stay (HOSPITAL_COMMUNITY)
Admission: RE | Admit: 2020-02-02 | Discharge: 2020-02-05 | DRG: 470 | Disposition: A | Discharge status: Home Health | Payer: Medicare Other | Attending: Orthopedic Surgery | Admitting: Orthopedic Surgery

## 2020-02-02 ENCOUNTER — Encounter (HOSPITAL_COMMUNITY): Payer: Self-pay | Admitting: Orthopedic Surgery

## 2020-02-02 ENCOUNTER — Encounter (HOSPITAL_COMMUNITY)
Admission: RE | Disposition: A | Discharge status: Home Health | Payer: Self-pay | Source: Home / Self Care | Attending: Orthopedic Surgery

## 2020-02-02 DIAGNOSIS — E669 Obesity, unspecified: Secondary | ICD-10-CM | POA: Diagnosis present

## 2020-02-02 DIAGNOSIS — M545 Low back pain: Secondary | ICD-10-CM | POA: Diagnosis present

## 2020-02-02 DIAGNOSIS — Z794 Long term (current) use of insulin: Secondary | ICD-10-CM

## 2020-02-02 DIAGNOSIS — Z8701 Personal history of pneumonia (recurrent): Secondary | ICD-10-CM

## 2020-02-02 DIAGNOSIS — I252 Old myocardial infarction: Secondary | ICD-10-CM

## 2020-02-02 DIAGNOSIS — E1122 Type 2 diabetes mellitus with diabetic chronic kidney disease: Secondary | ICD-10-CM | POA: Diagnosis present

## 2020-02-02 DIAGNOSIS — E785 Hyperlipidemia, unspecified: Secondary | ICD-10-CM | POA: Diagnosis present

## 2020-02-02 DIAGNOSIS — Z888 Allergy status to other drugs, medicaments and biological substances status: Secondary | ICD-10-CM

## 2020-02-02 DIAGNOSIS — Z833 Family history of diabetes mellitus: Secondary | ICD-10-CM

## 2020-02-02 DIAGNOSIS — I5032 Chronic diastolic (congestive) heart failure: Secondary | ICD-10-CM | POA: Diagnosis present

## 2020-02-02 DIAGNOSIS — Z885 Allergy status to narcotic agent status: Secondary | ICD-10-CM

## 2020-02-02 DIAGNOSIS — M797 Fibromyalgia: Secondary | ICD-10-CM | POA: Diagnosis present

## 2020-02-02 DIAGNOSIS — M1611 Unilateral primary osteoarthritis, right hip: Principal | ICD-10-CM | POA: Diagnosis present

## 2020-02-02 DIAGNOSIS — Z955 Presence of coronary angioplasty implant and graft: Secondary | ICD-10-CM

## 2020-02-02 DIAGNOSIS — Z79899 Other long term (current) drug therapy: Secondary | ICD-10-CM

## 2020-02-02 DIAGNOSIS — G8929 Other chronic pain: Secondary | ICD-10-CM | POA: Diagnosis present

## 2020-02-02 DIAGNOSIS — Z419 Encounter for procedure for purposes other than remedying health state, unspecified: Secondary | ICD-10-CM

## 2020-02-02 DIAGNOSIS — Z951 Presence of aortocoronary bypass graft: Secondary | ICD-10-CM

## 2020-02-02 DIAGNOSIS — M25751 Osteophyte, right hip: Secondary | ICD-10-CM | POA: Diagnosis present

## 2020-02-02 DIAGNOSIS — N184 Chronic kidney disease, stage 4 (severe): Secondary | ICD-10-CM | POA: Diagnosis present

## 2020-02-02 DIAGNOSIS — D62 Acute posthemorrhagic anemia: Secondary | ICD-10-CM | POA: Diagnosis not present

## 2020-02-02 DIAGNOSIS — Z7902 Long term (current) use of antithrombotics/antiplatelets: Secondary | ICD-10-CM

## 2020-02-02 DIAGNOSIS — I130 Hypertensive heart and chronic kidney disease with heart failure and stage 1 through stage 4 chronic kidney disease, or unspecified chronic kidney disease: Secondary | ICD-10-CM | POA: Diagnosis present

## 2020-02-02 DIAGNOSIS — Z8249 Family history of ischemic heart disease and other diseases of the circulatory system: Secondary | ICD-10-CM

## 2020-02-02 DIAGNOSIS — Z8261 Family history of arthritis: Secondary | ICD-10-CM

## 2020-02-02 DIAGNOSIS — Z96641 Presence of right artificial hip joint: Secondary | ICD-10-CM

## 2020-02-02 DIAGNOSIS — I2510 Atherosclerotic heart disease of native coronary artery without angina pectoris: Secondary | ICD-10-CM | POA: Diagnosis present

## 2020-02-02 DIAGNOSIS — J449 Chronic obstructive pulmonary disease, unspecified: Secondary | ICD-10-CM | POA: Diagnosis present

## 2020-02-02 DIAGNOSIS — Z9181 History of falling: Secondary | ICD-10-CM

## 2020-02-02 DIAGNOSIS — Z6833 Body mass index (BMI) 33.0-33.9, adult: Secondary | ICD-10-CM

## 2020-02-02 DIAGNOSIS — Z981 Arthrodesis status: Secondary | ICD-10-CM

## 2020-02-02 HISTORY — PX: TOTAL HIP ARTHROPLASTY: SHX124

## 2020-02-02 LAB — GLUCOSE, CAPILLARY
Glucose-Capillary: 123 mg/dL — ABNORMAL HIGH (ref 70–99)
Glucose-Capillary: 171 mg/dL — ABNORMAL HIGH (ref 70–99)
Glucose-Capillary: 172 mg/dL — ABNORMAL HIGH (ref 70–99)

## 2020-02-02 LAB — HEMOGLOBIN A1C
Hgb A1c MFr Bld: 5.3 % (ref 4.8–5.6)
Mean Plasma Glucose: 105.41 mg/dL

## 2020-02-02 SURGERY — ARTHROPLASTY, HIP, TOTAL, ANTERIOR APPROACH
Anesthesia: General | Site: Hip | Laterality: Right

## 2020-02-02 MED ORDER — CELECOXIB 200 MG PO CAPS
200.0000 mg | ORAL_CAPSULE | Freq: Two times a day (BID) | ORAL | Status: DC
  Administered 2020-02-02 – 2020-02-04 (×4): 200 mg via ORAL
  Filled 2020-02-02 (×4): qty 1

## 2020-02-02 MED ORDER — EPHEDRINE 5 MG/ML INJ
INTRAVENOUS | Status: AC
  Filled 2020-02-02: qty 10

## 2020-02-02 MED ORDER — HYDROMORPHONE HCL 1 MG/ML IJ SOLN
0.2500 mg | INTRAMUSCULAR | Status: DC | PRN
  Administered 2020-02-02 (×2): 0.5 mg via INTRAVENOUS

## 2020-02-02 MED ORDER — MENTHOL 3 MG MT LOZG: 1.0000 | LOZENGE | OROMUCOSAL | Status: DC | PRN

## 2020-02-02 MED ORDER — FLEET ENEMA 7-19 GM/118ML RE ENEM: 1.0000 | ENEMA | Freq: Once | RECTAL | Status: DC | PRN

## 2020-02-02 MED ORDER — KCL IN DEXTROSE-NACL 20-5-0.45 MEQ/L-%-% IV SOLN
INTRAVENOUS | Status: DC
  Filled 2020-02-02: qty 1000

## 2020-02-02 MED ORDER — CHLORHEXIDINE GLUCONATE 0.12 % MT SOLN: 15.0000 mL | Freq: Once | OROMUCOSAL | Status: AC

## 2020-02-02 MED ORDER — ORAL CARE MOUTH RINSE
15.0000 mL | Freq: Once | OROMUCOSAL | Status: AC
  Administered 2020-02-02: 15 mL via OROMUCOSAL

## 2020-02-02 MED ORDER — CLOPIDOGREL BISULFATE 75 MG PO TABS
75.0000 mg | ORAL_TABLET | Freq: Every day | ORAL | Status: DC
  Administered 2020-02-03 – 2020-02-05 (×3): 75 mg via ORAL
  Filled 2020-02-02 (×3): qty 1

## 2020-02-02 MED ORDER — PROPOFOL 10 MG/ML IV BOLUS
INTRAVENOUS | Status: AC
  Filled 2020-02-02: qty 20

## 2020-02-02 MED ORDER — MIDAZOLAM HCL 2 MG/2ML IJ SOLN
INTRAMUSCULAR | Status: AC
  Filled 2020-02-02: qty 2

## 2020-02-02 MED ORDER — PRIMIDONE 50 MG PO TABS
100.0000 mg | ORAL_TABLET | Freq: Every day | ORAL | Status: DC
  Administered 2020-02-03 – 2020-02-05 (×3): 100 mg via ORAL
  Filled 2020-02-02 (×3): qty 2

## 2020-02-02 MED ORDER — PHENYLEPHRINE 40 MCG/ML (10ML) SYRINGE FOR IV PUSH (FOR BLOOD PRESSURE SUPPORT)
PREFILLED_SYRINGE | INTRAVENOUS | Status: AC
  Filled 2020-02-02: qty 10

## 2020-02-02 MED ORDER — LIDOCAINE HCL (CARDIAC) PF 100 MG/5ML IV SOSY
PREFILLED_SYRINGE | INTRAVENOUS | Status: DC | PRN
  Administered 2020-02-02: 40 mg via INTRAVENOUS

## 2020-02-02 MED ORDER — BUPIVACAINE HCL (PF) 0.25 % IJ SOLN
INTRAMUSCULAR | Status: AC
  Filled 2020-02-02: qty 30

## 2020-02-02 MED ORDER — DOCUSATE SODIUM 100 MG PO CAPS
100.0000 mg | ORAL_CAPSULE | Freq: Two times a day (BID) | ORAL | Status: DC
  Administered 2020-02-02 – 2020-02-05 (×6): 100 mg via ORAL
  Filled 2020-02-02 (×6): qty 1

## 2020-02-02 MED ORDER — INSULIN GLARGINE 100 UNIT/ML ~~LOC~~ SOLN
40.0000 [IU] | Freq: Every day | SUBCUTANEOUS | Status: DC
  Administered 2020-02-02 – 2020-02-04 (×3): 40 [IU] via SUBCUTANEOUS
  Filled 2020-02-02 (×3): qty 0.4

## 2020-02-02 MED ORDER — INSULIN LISPRO 100 UNIT/ML (KWIKPEN): 8.0000 [IU] | PEN_INJECTOR | Freq: Three times a day (TID) | SUBCUTANEOUS | Status: DC

## 2020-02-02 MED ORDER — SODIUM CHLORIDE (PF) 0.9 % IJ SOLN
INTRAMUSCULAR | Status: AC
  Filled 2020-02-02: qty 50

## 2020-02-02 MED ORDER — PROPOFOL 500 MG/50ML IV EMUL
INTRAVENOUS | Status: AC
  Filled 2020-02-02: qty 50

## 2020-02-02 MED ORDER — PROMETHAZINE HCL 25 MG/ML IJ SOLN: 6.2500 mg | INTRAMUSCULAR | Status: DC | PRN

## 2020-02-02 MED ORDER — PHENOL 1.4 % MT LIQD: 1.0000 | OROMUCOSAL | Status: DC | PRN

## 2020-02-02 MED ORDER — ISOSORBIDE MONONITRATE ER 60 MG PO TB24
60.0000 mg | ORAL_TABLET | Freq: Every day | ORAL | Status: DC
  Administered 2020-02-03 – 2020-02-05 (×3): 60 mg via ORAL
  Filled 2020-02-02 (×3): qty 1

## 2020-02-02 MED ORDER — POLYETHYLENE GLYCOL 3350 17 G PO PACK
17.0000 g | PACK | Freq: Every day | ORAL | Status: DC | PRN
  Administered 2020-02-05: 17 g via ORAL
  Filled 2020-02-02: qty 1

## 2020-02-02 MED ORDER — ATORVASTATIN CALCIUM 40 MG PO TABS
40.0000 mg | ORAL_TABLET | Freq: Every evening | ORAL | Status: DC
  Administered 2020-02-02 – 2020-02-04 (×3): 40 mg via ORAL
  Filled 2020-02-02 (×3): qty 1

## 2020-02-02 MED ORDER — ALBUTEROL SULFATE (2.5 MG/3ML) 0.083% IN NEBU: 2.5000 mg | INHALATION_SOLUTION | RESPIRATORY_TRACT | Status: DC | PRN

## 2020-02-02 MED ORDER — ALBUMIN HUMAN 5 % IV SOLN
INTRAVENOUS | Status: DC | PRN
Start: 2020-02-02 — End: 2020-02-02

## 2020-02-02 MED ORDER — LISINOPRIL 5 MG PO TABS
5.0000 mg | ORAL_TABLET | Freq: Every evening | ORAL | Status: DC
  Administered 2020-02-03: 5 mg via ORAL
  Filled 2020-02-02: qty 1

## 2020-02-02 MED ORDER — 0.9 % SODIUM CHLORIDE (POUR BTL) OPTIME
TOPICAL | Status: DC | PRN
  Administered 2020-02-02: 1000 mL

## 2020-02-02 MED ORDER — DEXAMETHASONE SODIUM PHOSPHATE 10 MG/ML IJ SOLN
INTRAMUSCULAR | Status: AC
  Filled 2020-02-02: qty 1

## 2020-02-02 MED ORDER — GABAPENTIN 100 MG PO CAPS
100.0000 mg | ORAL_CAPSULE | Freq: Three times a day (TID) | ORAL | Status: DC
  Administered 2020-02-02 – 2020-02-05 (×8): 100 mg via ORAL
  Filled 2020-02-02 (×8): qty 1

## 2020-02-02 MED ORDER — ONDANSETRON HCL 4 MG PO TABS: 4.0000 mg | ORAL_TABLET | Freq: Four times a day (QID) | ORAL | Status: DC | PRN

## 2020-02-02 MED ORDER — BUPIVACAINE LIPOSOME 1.3 % IJ SUSP
INTRAMUSCULAR | Status: DC | PRN
  Administered 2020-02-02: 10 mL

## 2020-02-02 MED ORDER — BUPIVACAINE HCL (PF) 0.25 % IJ SOLN
INTRAMUSCULAR | Status: DC | PRN
  Administered 2020-02-02: 30 mL

## 2020-02-02 MED ORDER — INSULIN ASPART 100 UNIT/ML ~~LOC~~ SOLN
0.0000 [IU] | Freq: Three times a day (TID) | SUBCUTANEOUS | Status: DC
  Administered 2020-02-03: 11 [IU] via SUBCUTANEOUS
  Administered 2020-02-03 – 2020-02-04 (×2): 2 [IU] via SUBCUTANEOUS
  Administered 2020-02-04: 3 [IU] via SUBCUTANEOUS
  Administered 2020-02-05: 2 [IU] via SUBCUTANEOUS

## 2020-02-02 MED ORDER — HYDROMORPHONE HCL 1 MG/ML IJ SOLN
INTRAMUSCULAR | Status: AC
  Filled 2020-02-02: qty 1

## 2020-02-02 MED ORDER — ALBUMIN HUMAN 5 % IV SOLN
INTRAVENOUS | Status: AC
  Filled 2020-02-02: qty 250

## 2020-02-02 MED ORDER — DIPHENHYDRAMINE HCL 12.5 MG/5ML PO ELIX: 12.5000 mg | ORAL_SOLUTION | ORAL | Status: DC | PRN

## 2020-02-02 MED ORDER — METOCLOPRAMIDE HCL 5 MG PO TABS: 5.0000 mg | ORAL_TABLET | Freq: Three times a day (TID) | ORAL | Status: DC | PRN

## 2020-02-02 MED ORDER — MIDAZOLAM HCL 5 MG/5ML IJ SOLN
INTRAMUSCULAR | Status: DC | PRN
  Administered 2020-02-02 (×2): 1 mg via INTRAVENOUS

## 2020-02-02 MED ORDER — AMLODIPINE BESYLATE 10 MG PO TABS
10.0000 mg | ORAL_TABLET | Freq: Every day | ORAL | Status: DC
  Administered 2020-02-03 – 2020-02-05 (×3): 10 mg via ORAL
  Filled 2020-02-02 (×3): qty 1

## 2020-02-02 MED ORDER — HYDROMORPHONE HCL 1 MG/ML IJ SOLN
INTRAMUSCULAR | Status: DC | PRN
  Administered 2020-02-02 (×4): .5 mg via INTRAVENOUS

## 2020-02-02 MED ORDER — HYDROMORPHONE HCL 1 MG/ML IJ SOLN
0.5000 mg | INTRAMUSCULAR | Status: DC | PRN
  Administered 2020-02-03: 0.5 mg via INTRAVENOUS
  Filled 2020-02-02: qty 0.5

## 2020-02-02 MED ORDER — ASPIRIN EC 81 MG PO TBEC
81.0000 mg | DELAYED_RELEASE_TABLET | Freq: Two times a day (BID) | ORAL | 0 refills | Status: DC
Start: 2020-02-02 — End: 2020-03-17

## 2020-02-02 MED ORDER — BUSPIRONE HCL 5 MG PO TABS
5.0000 mg | ORAL_TABLET | Freq: Every day | ORAL | Status: DC | PRN
  Administered 2020-02-02: 5 mg via ORAL
  Filled 2020-02-02: qty 1

## 2020-02-02 MED ORDER — STERILE WATER FOR IRRIGATION IR SOLN
Status: DC | PRN
  Administered 2020-02-02: 2000 mL

## 2020-02-02 MED ORDER — LACTATED RINGERS IV SOLN: INTRAVENOUS | Status: DC

## 2020-02-02 MED ORDER — PROPOFOL 10 MG/ML IV BOLUS
INTRAVENOUS | Status: DC | PRN
  Administered 2020-02-02: 150 mg via INTRAVENOUS

## 2020-02-02 MED ORDER — PHENYLEPHRINE HCL (PRESSORS) 10 MG/ML IV SOLN
INTRAVENOUS | Status: AC
  Filled 2020-02-02: qty 1

## 2020-02-02 MED ORDER — DEXAMETHASONE SODIUM PHOSPHATE 4 MG/ML IJ SOLN
INTRAMUSCULAR | Status: DC | PRN
  Administered 2020-02-02: 4 mg via INTRAVENOUS

## 2020-02-02 MED ORDER — TIZANIDINE HCL 2 MG PO TABS
2.0000 mg | ORAL_TABLET | Freq: Four times a day (QID) | ORAL | 0 refills | Status: DC | PRN
Start: 2020-02-02 — End: 2020-11-18

## 2020-02-02 MED ORDER — ONDANSETRON HCL 4 MG/2ML IJ SOLN
INTRAMUSCULAR | Status: AC
  Filled 2020-02-02: qty 2

## 2020-02-02 MED ORDER — BISACODYL 5 MG PO TBEC: 5.0000 mg | DELAYED_RELEASE_TABLET | Freq: Every day | ORAL | Status: DC | PRN

## 2020-02-02 MED ORDER — ACETAMINOPHEN 325 MG PO TABS: 325.0000 mg | ORAL_TABLET | Freq: Four times a day (QID) | ORAL | Status: DC | PRN

## 2020-02-02 MED ORDER — TRANEXAMIC ACID-NACL 1000-0.7 MG/100ML-% IV SOLN
INTRAVENOUS | Status: AC
  Filled 2020-02-02: qty 100

## 2020-02-02 MED ORDER — FENTANYL CITRATE (PF) 100 MCG/2ML IJ SOLN
INTRAMUSCULAR | Status: DC | PRN
  Administered 2020-02-02 (×5): 50 ug via INTRAVENOUS

## 2020-02-02 MED ORDER — OXYCODONE HCL 5 MG PO TABS
5.0000 mg | ORAL_TABLET | ORAL | Status: DC | PRN
  Administered 2020-02-02 – 2020-02-05 (×9): 5 mg via ORAL
  Filled 2020-02-02 (×10): qty 1

## 2020-02-02 MED ORDER — NITROGLYCERIN 0.4 MG SL SUBL: 0.4000 mg | SUBLINGUAL_TABLET | SUBLINGUAL | Status: DC | PRN

## 2020-02-02 MED ORDER — HYDROMORPHONE HCL 2 MG PO TABS: 2.0000 mg | ORAL_TABLET | ORAL | 0 refills | Status: DC | PRN

## 2020-02-02 MED ORDER — DEXAMETHASONE SODIUM PHOSPHATE 10 MG/ML IJ SOLN
10.0000 mg | Freq: Once | INTRAMUSCULAR | Status: AC
  Administered 2020-02-03: 10 mg via INTRAVENOUS
  Filled 2020-02-02: qty 1

## 2020-02-02 MED ORDER — METOCLOPRAMIDE HCL 5 MG/ML IJ SOLN
5.0000 mg | Freq: Three times a day (TID) | INTRAMUSCULAR | Status: DC | PRN
  Administered 2020-02-02: 10 mg via INTRAVENOUS
  Filled 2020-02-02: qty 2

## 2020-02-02 MED ORDER — EPHEDRINE SULFATE-NACL 50-0.9 MG/10ML-% IV SOSY
PREFILLED_SYRINGE | INTRAVENOUS | Status: DC | PRN
  Administered 2020-02-02: 10 mg via INTRAVENOUS

## 2020-02-02 MED ORDER — ONDANSETRON HCL 4 MG/2ML IJ SOLN
4.0000 mg | Freq: Four times a day (QID) | INTRAMUSCULAR | Status: DC | PRN
  Administered 2020-02-02: 4 mg via INTRAVENOUS
  Filled 2020-02-02: qty 2

## 2020-02-02 MED ORDER — ALUM & MAG HYDROXIDE-SIMETH 200-200-20 MG/5ML PO SUSP: 30.0000 mL | ORAL | Status: DC | PRN

## 2020-02-02 MED ORDER — MONTELUKAST SODIUM 10 MG PO TABS
10.0000 mg | ORAL_TABLET | Freq: Every day | ORAL | Status: DC
  Administered 2020-02-03 – 2020-02-04 (×2): 10 mg via ORAL
  Filled 2020-02-02 (×3): qty 1

## 2020-02-02 MED ORDER — HYDRALAZINE HCL 50 MG PO TABS
100.0000 mg | ORAL_TABLET | Freq: Three times a day (TID) | ORAL | Status: DC
  Administered 2020-02-03 – 2020-02-05 (×8): 100 mg via ORAL
  Filled 2020-02-02 (×9): qty 2

## 2020-02-02 MED ORDER — ASPIRIN 81 MG PO CHEW
81.0000 mg | CHEWABLE_TABLET | Freq: Two times a day (BID) | ORAL | Status: DC
  Administered 2020-02-02 – 2020-02-05 (×6): 81 mg via ORAL
  Filled 2020-02-02 (×6): qty 1

## 2020-02-02 MED ORDER — FLUTICASONE PROPIONATE 50 MCG/ACT NA SUSP
1.0000 | Freq: Every day | NASAL | Status: DC
  Filled 2020-02-02: qty 16

## 2020-02-02 MED ORDER — ONDANSETRON HCL 4 MG/2ML IJ SOLN
INTRAMUSCULAR | Status: DC | PRN
  Administered 2020-02-02: 4 mg via INTRAVENOUS

## 2020-02-02 MED ORDER — TRANEXAMIC ACID-NACL 1000-0.7 MG/100ML-% IV SOLN
1000.0000 mg | Freq: Once | INTRAVENOUS | Status: AC
  Administered 2020-02-02: 1000 mg via INTRAVENOUS
  Filled 2020-02-02: qty 100

## 2020-02-02 MED ORDER — ROCURONIUM BROMIDE 100 MG/10ML IV SOLN
INTRAVENOUS | Status: DC | PRN
  Administered 2020-02-02: 60 mg via INTRAVENOUS
  Administered 2020-02-02: 20 mg via INTRAVENOUS

## 2020-02-02 MED ORDER — ADULT MULTIVITAMIN W/MINERALS CH
1.0000 | ORAL_TABLET | Freq: Every day | ORAL | Status: DC
  Administered 2020-02-03 – 2020-02-05 (×3): 1 via ORAL
  Filled 2020-02-02 (×3): qty 1

## 2020-02-02 MED ORDER — FENTANYL CITRATE (PF) 100 MCG/2ML IJ SOLN
INTRAMUSCULAR | Status: AC
  Filled 2020-02-02: qty 2

## 2020-02-02 MED ORDER — ROPINIROLE HCL 1 MG PO TABS
1.0000 mg | ORAL_TABLET | Freq: Three times a day (TID) | ORAL | Status: DC | PRN
  Administered 2020-02-02 – 2020-02-03 (×3): 1 mg via ORAL
  Filled 2020-02-02 (×3): qty 1

## 2020-02-02 MED ORDER — FERROUS SULFATE 325 (65 FE) MG PO TABS
325.0000 mg | ORAL_TABLET | ORAL | Status: DC
  Administered 2020-02-02 – 2020-02-04 (×2): 325 mg via ORAL
  Filled 2020-02-02 (×2): qty 1

## 2020-02-02 MED ORDER — FENTANYL CITRATE (PF) 250 MCG/5ML IJ SOLN
INTRAMUSCULAR | Status: AC
  Filled 2020-02-02: qty 5

## 2020-02-02 MED ORDER — LIDOCAINE 2% (20 MG/ML) 5 ML SYRINGE
INTRAMUSCULAR | Status: AC
  Filled 2020-02-02: qty 5

## 2020-02-02 MED ORDER — SODIUM CHLORIDE 0.9% FLUSH
INTRAVENOUS | Status: DC | PRN
  Administered 2020-02-02: 50 mL

## 2020-02-02 MED ORDER — FUROSEMIDE 40 MG PO TABS
40.0000 mg | ORAL_TABLET | Freq: Every day | ORAL | Status: DC
  Administered 2020-02-03 – 2020-02-05 (×3): 40 mg via ORAL
  Filled 2020-02-02 (×3): qty 1

## 2020-02-02 MED ORDER — ACETAMINOPHEN 500 MG PO TABS
1000.0000 mg | ORAL_TABLET | Freq: Four times a day (QID) | ORAL | Status: AC
  Administered 2020-02-02 – 2020-02-03 (×4): 1000 mg via ORAL
  Filled 2020-02-02 (×4): qty 2

## 2020-02-02 MED ORDER — PANTOPRAZOLE SODIUM 40 MG PO TBEC
40.0000 mg | DELAYED_RELEASE_TABLET | Freq: Every day | ORAL | Status: DC
  Administered 2020-02-03 – 2020-02-05 (×3): 40 mg via ORAL
  Filled 2020-02-02 (×3): qty 1

## 2020-02-02 MED ORDER — ALBUTEROL SULFATE HFA 108 (90 BASE) MCG/ACT IN AERS: 2.0000 | INHALATION_SPRAY | RESPIRATORY_TRACT | Status: DC | PRN

## 2020-02-02 MED ORDER — HYDROMORPHONE HCL 2 MG/ML IJ SOLN
INTRAMUSCULAR | Status: AC
  Filled 2020-02-02: qty 1

## 2020-02-02 MED ORDER — TRANEXAMIC ACID-NACL 1000-0.7 MG/100ML-% IV SOLN
1000.0000 mg | INTRAVENOUS | Status: AC
  Administered 2020-02-02: 1000 mg via INTRAVENOUS

## 2020-02-02 MED ORDER — SUGAMMADEX SODIUM 200 MG/2ML IV SOLN
INTRAVENOUS | Status: DC | PRN
Start: 2020-02-02 — End: 2020-02-02
  Administered 2020-02-02: 200 mg via INTRAVENOUS

## 2020-02-02 MED ORDER — POVIDONE-IODINE 10 % EX SWAB
2.0000 "application " | Freq: Once | CUTANEOUS | Status: AC
  Administered 2020-02-02: 2 via TOPICAL

## 2020-02-02 MED ORDER — CEFAZOLIN SODIUM-DEXTROSE 2-4 GM/100ML-% IV SOLN
INTRAVENOUS | Status: AC
  Filled 2020-02-02: qty 100

## 2020-02-02 MED ORDER — CEFAZOLIN SODIUM-DEXTROSE 2-4 GM/100ML-% IV SOLN
2.0000 g | INTRAVENOUS | Status: AC
  Administered 2020-02-02: 2 g via INTRAVENOUS

## 2020-02-02 MED ORDER — POLYETHYLENE GLYCOL 3350 17 G PO PACK: 17.0000 g | PACK | Freq: Every day | ORAL | Status: DC | PRN

## 2020-02-02 MED ORDER — INSULIN ASPART 100 UNIT/ML ~~LOC~~ SOLN
8.0000 [IU] | Freq: Three times a day (TID) | SUBCUTANEOUS | Status: DC
  Administered 2020-02-03 – 2020-02-05 (×5): 8 [IU] via SUBCUTANEOUS

## 2020-02-02 SURGICAL SUPPLY — 49 items
BAG DECANTER FOR FLEXI CONT (MISCELLANEOUS) ×3 IMPLANT
BLADE SAW SGTL 18X1.27X75 (BLADE) ×2 IMPLANT
BLADE SAW SGTL 18X1.27X75MM (BLADE) ×1
BLADE SURG SZ10 CARB STEEL (BLADE) ×6 IMPLANT
CNTNR URN SCR LID CUP LEK RST (MISCELLANEOUS) ×1 IMPLANT
CONT SPEC 4OZ STRL OR WHT (MISCELLANEOUS) ×3
COVER PERINEAL POST (MISCELLANEOUS) ×3 IMPLANT
COVER SURGICAL LIGHT HANDLE (MISCELLANEOUS) ×3 IMPLANT
COVER WAND RF STERILE (DRAPES) ×3 IMPLANT
DECANTER SPIKE VIAL GLASS SM (MISCELLANEOUS) ×6 IMPLANT
DRAPE STERI IOBAN 125X83 (DRAPES) ×3 IMPLANT
DRAPE U-SHAPE 47X51 STRL (DRAPES) ×6 IMPLANT
DRSG AQUACEL AG ADV 3.5X10 (GAUZE/BANDAGES/DRESSINGS) ×3 IMPLANT
DURAPREP 26ML APPLICATOR (WOUND CARE) ×3 IMPLANT
ELECT BLADE TIP CTD 4 INCH (ELECTRODE) ×3 IMPLANT
ELECT REM PT RETURN 15FT ADLT (MISCELLANEOUS) ×3 IMPLANT
ELIMINATOR HOLE APEX DEPUY (Hips) ×2 IMPLANT
GLOVE BIO SURGEON STRL SZ7.5 (GLOVE) ×3 IMPLANT
GLOVE BIO SURGEON STRL SZ8.5 (GLOVE) ×3 IMPLANT
GLOVE BIOGEL PI IND STRL 8 (GLOVE) ×1 IMPLANT
GLOVE BIOGEL PI IND STRL 9 (GLOVE) ×1 IMPLANT
GLOVE BIOGEL PI INDICATOR 8 (GLOVE) ×2
GLOVE BIOGEL PI INDICATOR 9 (GLOVE) ×2
GOWN STRL REUS W/TWL XL LVL3 (GOWN DISPOSABLE) ×6 IMPLANT
HEAD FEMORAL 32 CERAMIC (Hips) ×2 IMPLANT
HOLDER FOLEY CATH W/STRAP (MISCELLANEOUS) ×3 IMPLANT
KIT TURNOVER KIT A (KITS) IMPLANT
LINER ACET PNNCL PLUS4 NEUTRAL (Hips) IMPLANT
MANIFOLD NEPTUNE II (INSTRUMENTS) ×3 IMPLANT
NDL HYPO 21X1.5 SAFETY (NEEDLE) ×2 IMPLANT
NEEDLE HYPO 21X1.5 SAFETY (NEEDLE) ×6 IMPLANT
NS IRRIG 1000ML POUR BTL (IV SOLUTION) ×3 IMPLANT
PACK ANTERIOR HIP CUSTOM (KITS) ×3 IMPLANT
PENCIL SMOKE EVACUATOR (MISCELLANEOUS) IMPLANT
PIN SECT CUP 50MM (Hips) ×2 IMPLANT
PINNACLE PLUS 4 NEUTRAL (Hips) ×3 IMPLANT
STEM FEMORAL SZ5 HIGH ACTIS (Stem) ×2 IMPLANT
SUT ETHIBOND NAB CT1 #1 30IN (SUTURE) ×3 IMPLANT
SUT VIC AB 0 CT1 27 (SUTURE)
SUT VIC AB 0 CT1 27XBRD ANBCTR (SUTURE) IMPLANT
SUT VIC AB 1 CTX 36 (SUTURE) ×3
SUT VIC AB 1 CTX36XBRD ANBCTR (SUTURE) ×1 IMPLANT
SUT VIC AB 2-0 CT1 27 (SUTURE)
SUT VIC AB 2-0 CT1 TAPERPNT 27 (SUTURE) IMPLANT
SUT VIC AB 3-0 CT1 27 (SUTURE) ×3
SUT VIC AB 3-0 CT1 TAPERPNT 27 (SUTURE) ×1 IMPLANT
SYR CONTROL 10ML LL (SYRINGE) ×9 IMPLANT
TRAY FOLEY MTR SLVR 16FR STAT (SET/KITS/TRAYS/PACK) IMPLANT
YANKAUER SUCT BULB TIP 10FT TU (MISCELLANEOUS) ×3 IMPLANT

## 2020-02-02 NOTE — Op Note (Signed)
PATIENT ID:      Joyce [name]  MRN:     [id number] DOB/AGE:    1940/12/01 / 79 y.o.  OPERATIVE REPORT   DATE OF PROCEDURE:  02/02/2020      PREOPERATIVE DIAGNOSIS:  RIGHT HIP OSTEOARTHITIS                                                         POSTOPERATIVE DIAGNOSIS:  Same                                                         PROCEDURE: Anterior 5hi total hip arthroplasty using a 50 mm DePuy Pinnacle  Cup, [employer], 0-degree polyethylene liner, a +1 mm x 7mm ceramic head, a 5hi Depuy Actis stem  SURGEON: Kerin Salen  ASSISTANT:   Kerry Hough. [employer]  (present throughout entire procedure and necessary for timely completion of the procedure)   ANESTHESIA: Spinal, Exparel 133mg  injection BLOOD LOSS: 400 cc FLUID REPLACEMENT: 1600 cc crystalloid TRANEXAMIC ACID: 1gm IV, 2gm Topical COMPLICATIONS: none    INDICATIONS FOR PROCEDURE: A 79 y.o. year-old With  RIGHT HIP OSTEOARTHITIS   for 3 years, x-rays show bone-on-bone arthritic changes, and osteophytes. Despite conservative measures with observation, anti-inflammatory medicine, narcotics, use of a cane, has severe unremitting pain and can ambulate only a few blocks before resting. Patient desires elective R total hip arthroplasty to decrease pain and increase function. The risks, benefits, and alternatives were discussed at length including but not limited to the risks of infection, bleeding, nerve injury, stiffness, blood clots, the need for revision surgery, cardiopulmonary complications, among others, and they were willing to proceed. Questions answered      PROCEDURE IN DETAIL: The patient was identified by armband,   received preoperative IV antibiotics in the holding area at [facility], taken to the operating room , appropriate anesthetic monitors   were attached and anesthesia was induced with the patient on the gurney. HANA boots were applied to the feet, and the patient  was transferred to the HANA  table with a peroneal post and support underneath the non-operative leg. Theoperative lower extremity was then prepped and draped in the usual sterile fashion from just above the iliac crest to the knee. And a timeout procedure was performed. Kerry Hough. Hardin Negus [facility] was present and scrubbed throughout the case, critical for assistance with, positioning, exposure, retraction, instrumentation, and closure.Skin along incision area was injected with 10 cc of Exparel solution. We then made a 15 cm incision along the interval at the leading edge of the tensor fascia lata of starting at 2 cm lateral to the ASIS. Small bleeders in the skin and subcutaneous tissue identified and cauterized we dissected down to the fascia and made an incision in the fascia allowing [place] to elevate the fascia of the tensor muscle and exploited the interval between the rectus and the tensor fascia lata. A Cobra retractor was then placed along the superior neck of the femur. A cerebellar retractor was used to expose the interval between the tensor fascia lata and the rectus femoris.  We identified and  cauterized the ascending branch of the anterior circumflex artery. A second Cobra retractor along the inferior neck of the femur. A small Hohmann retractor was placed underneath the origin of the rectus femoris, giving [place] good medial exposure. Using Ronguers fatty tissue was removed from in front of the anterior capsule. The capsule was then incised, starting out at the superior anterior rim of the acetabulum going laterally along the anterior neck. The capsule was then teed along the neck superiorly and inferiorly. Electrocautery was used to release capsule from the anterior and medial neck of the femur to allow external rotation. Cobra retractors were then placed along the inferior and superior neck allowing [place] to perform a standard neck cut and removed the femoral head with a power corkscrew. We then placed a medium bent homan retractor in the  cotyloid notch and posteriorly along the acetabular rim a narrow Cobra retractor. Exposed labral tissue and osteophytes were then removed. We then sequentially reamed up to a 49 mm basket reamer obtaining good coverage in all quadrants, verified by C-arm imaging. Under C-arm control we then hammered into place a 50 mm Pinnacle cup in 45 of abduction and 15 of anteversion. The cup seated nicely and required no supplemental screws. We then placed a central hole Eliminator and a 0 polyethylene liner. The foot was then externally rotated to 130-140. The limb was extended and adducted to the floor, delivering the proximal femur up into the wound. A medium curved Hohmann retractor was placed over the greater trochanter and a long Homan retractor along the posterior femoral neck completing the exposure and lateralizing the femur. We then performed releases superiorly and and inferiorly of the capsule going back to the pirformis fossa superiorly and to the lesser trochanter inferiorly. We then entered the proximal femur with the box cutting offset chisel followed by, a canal sounder, the chili pepper and broaching up to a 5hi broach. This seated nicely and we reamed the calcar. A trial reduction was performed with a 1 mm X 32 mm head.The limb lengths were excellent the hip was stable in 90 of external rotation. At this point the trial components removed and we hammered into place a # 5 hi  Offset Actis stem with Gryption coating. A + 1 mm x 32 ceramic head was then hammered into place. The hip was reduced and final C-arm images obtained. The wound was thoroughly irrigated with normal saline solution. We repaired the ant capsule and the tensor fascia lot a with running 0 vicryl suture. the subcutaneous tissue was closed with 2-0 and 3-0 Vicryl suture followed by an Aquacil dressing. At this point the patient was awaken and transferred to hospital gurney without difficulty.   Kerin Salen 02/02/2020, 12:15 PM

## 2020-02-02 NOTE — Discharge Instructions (Signed)

## 2020-02-02 NOTE — Transfer of Care (Signed)
Immediate Anesthesia Transfer of Care Note  Patient: NAVEH RICKLES  Procedure(s) Performed: RIGHT TOTAL HIP ARTHROPLASTY ANTERIOR APPROACH (Right Hip)  Patient Location: PACU  Anesthesia Type:General  Level of Consciousness: awake and oriented  Airway & Oxygen Therapy: Patient Spontanous Breathing and Patient connected to face mask oxygen  Post-op Assessment: Report given to RN and Post -op Vital signs reviewed and stable  Post vital signs: Reviewed and stable  Last Vitals:  Vitals Value Taken Time  BP 163/69 02/02/20 1545  Temp    Pulse 84 02/02/20 1546  Resp 16 02/02/20 1546  SpO2 100 % 02/02/20 1546  Vitals shown include unvalidated device data.  Last Pain:  Vitals:   02/02/20 1119  TempSrc: Oral         Complications: No complications documented.

## 2020-02-02 NOTE — Anesthesia Procedure Notes (Signed)
Procedure Name: Intubation Date/Time: 02/02/2020 1:45 PM Performed by: Garrel Ridgel, CRNA Pre-anesthesia Checklist: Patient identified, Emergency Drugs available, Suction available and Patient being monitored Patient Re-evaluated:Patient Re-evaluated prior to induction Oxygen Delivery Method: Circle system utilized Preoxygenation: Pre-oxygenation with 100% oxygen Induction Type: IV induction Ventilation: Mask ventilation without difficulty Laryngoscope Size: Glidescope Grade View: Grade I Tube type: Oral Number of attempts: 1 Airway Equipment and Method: Stylet and Video-laryngoscopy Placement Confirmation: ETT inserted through vocal cords under direct vision,  positive ETCO2 and breath sounds checked- equal and bilateral Secured at: 23 cm Tube secured with: Tape Dental Injury: Teeth and Oropharynx as per pre-operative assessment

## 2020-02-02 NOTE — Interval H&P Note (Signed)
History and Physical Interval Note:  02/02/2020 12:14 PM  Joyce [name]  has presented today for surgery, with the diagnosis of RIGHT HIP OSTEOARTHITIS.  The various methods of treatment have been discussed with the patient and family. After consideration of risks, benefits and other options for treatment, the patient has consented to  Procedure(s): RIGHT TOTAL HIP ARTHROPLASTY ANTERIOR APPROACH (Right) as a surgical intervention.  The patient's history has been reviewed, patient examined, no change in status, stable for surgery.  I have reviewed the patient's chart and labs.  Questions were answered to the patient's satisfaction.     Kerin Salen

## 2020-02-03 ENCOUNTER — Other Ambulatory Visit: Payer: Self-pay

## 2020-02-03 ENCOUNTER — Encounter (HOSPITAL_COMMUNITY): Payer: Self-pay | Admitting: Orthopedic Surgery

## 2020-02-03 DIAGNOSIS — K760 Fatty (change of) liver, not elsewhere classified: Secondary | ICD-10-CM | POA: Insufficient documentation

## 2020-02-03 DIAGNOSIS — M5126 Other intervertebral disc displacement, lumbar region: Secondary | ICD-10-CM | POA: Insufficient documentation

## 2020-02-03 LAB — CBC
HCT: 25 % — ABNORMAL LOW (ref 36.0–46.0)
Hemoglobin: 7.9 g/dL — ABNORMAL LOW (ref 12.0–15.0)
MCH: 29.4 pg (ref 26.0–34.0)
MCHC: 31.6 g/dL (ref 30.0–36.0)
MCV: 92.9 fL (ref 80.0–100.0)
Platelets: 133 10*3/uL — ABNORMAL LOW (ref 150–400)
RBC: 2.69 MIL/uL — ABNORMAL LOW (ref 3.87–5.11)
RDW: 13.2 % (ref 11.5–15.5)
WBC: 7 10*3/uL (ref 4.0–10.5)
nRBC: 0 % (ref 0.0–0.2)

## 2020-02-03 LAB — BASIC METABOLIC PANEL
Anion gap: 8 (ref 5–15)
BUN: 46 mg/dL — ABNORMAL HIGH (ref 8–23)
CO2: 20 mmol/L — ABNORMAL LOW (ref 22–32)
Calcium: 8.2 mg/dL — ABNORMAL LOW (ref 8.9–10.3)
Chloride: 107 mmol/L (ref 98–111)
Creatinine, Ser: 2.4 mg/dL — ABNORMAL HIGH (ref 0.44–1.00)
GFR calc Af Amer: 22 mL/min — ABNORMAL LOW (ref >60)
GFR calc non Af Amer: 19 mL/min — ABNORMAL LOW (ref >60)
Glucose, Bld: 189 mg/dL — ABNORMAL HIGH (ref 70–99)
Potassium: 5.9 mmol/L — ABNORMAL HIGH (ref 3.5–5.1)
Sodium: 135 mmol/L (ref 135–145)

## 2020-02-03 LAB — GLUCOSE, CAPILLARY
Glucose-Capillary: 142 mg/dL — ABNORMAL HIGH (ref 70–99)
Glucose-Capillary: 190 mg/dL — ABNORMAL HIGH (ref 70–99)
Glucose-Capillary: 319 mg/dL — ABNORMAL HIGH (ref 70–99)

## 2020-02-03 MED ORDER — DEXTROSE-NACL 5-0.45 % IV SOLN: INTRAVENOUS | Status: DC

## 2020-02-03 NOTE — Progress Notes (Signed)
Just called the [place] on call PA/physician about the patient's high BP. Just gave patient IV Dilaudid. Waiting on orders for BP. Will re-check BP in 30-45 mins.

## 2020-02-03 NOTE — Progress Notes (Signed)
Physical Therapy Treatment Patient Details Name: Joyce [name] MRN: [id number] DOB: 05/15/1941 Today's Date: 02/03/2020    History of Present Illness 79 yo female s/p R THA-direct anterior 02/02/20. Hx of CABG, NSTEMI, Dm, obesity    PT Comments    Progressing slowly.    Follow Up Recommendations  Follow surgeon's recommendation for DC plan and follow-up therapies     Equipment Recommendations  None recommended by PT    Recommendations for Other Services       Precautions / Restrictions Precautions Precautions: Fall Restrictions Weight Bearing Restrictions: No Other Position/Activity Restrictions: WBAT    Mobility  Bed Mobility Overal bed mobility: Needs Assistance Bed Mobility: Supine to Sit;Sit to Supine     Supine to sit: Min assist;HOB elevated Sit to supine: Min assist;HOB elevated   General bed mobility comments: Assist for R LE.  Increased time.  Transfers Overall transfer level: Needs assistance Equipment used: Rolling walker (2 wheeled) Transfers: Sit to/from Stand Sit to Stand: Min assist         General transfer comment: Assist to rise, stabilize, control descent. VCs safety, technique, hand placement.  Ambulation/Gait Ambulation/Gait assistance: Min assist Gait Distance (Feet): 40 Feet Assistive device: Rolling walker (2 wheeled) Gait Pattern/deviations: Step-to pattern     General Gait Details: VCs safety, technique, sequence. Assist to stabilize throughout short distance. Distance limited by pain, fatigue.   Stairs             Wheelchair Mobility    Modified Rankin (Stroke Patients Only)       Balance Overall balance assessment: Needs assistance         Standing balance support: Bilateral upper extremity supported Standing balance-Leahy Scale: Poor                              Cognition Arousal/Alertness: Awake/alert Behavior During Therapy: WFL for tasks assessed/performed Overall Cognitive Status:  Within Functional Limits for tasks assessed                                        Exercises Total Joint Exercises Ankle Circles/Pumps: AROM;Both;10 reps Quad Sets: AROM;Both;10 reps Heel Slides: AAROM;Right;10 reps Hip ABduction/ADduction: AAROM;Right;10 reps    General Comments        Pertinent Vitals/Pain Pain Assessment: 0-10 Pain Score: 6  Pain Location: R hip Pain Descriptors / Indicators: Discomfort;Sore Pain Intervention(s): Limited activity within patient's tolerance;Monitored during session    [place] expects to be discharged to:: Private residence Living Arrangements: Alone Available Help at Discharge: Family (daughter) Type of Home: House Home Access: Stairs to enter Entrance Stairs-Rails: Right Home Layout: Able to live on main level with bedroom/bathroom Home Equipment: Walker - 2 wheels;Cane - single point;Bedside commode      Prior Function Level of Independence: Independent          PT Goals (current goals can now be found in the care plan section) Acute Rehab PT Goals Patient Stated Goal: less pain PT Goal Formulation: With patient Time For Goal Achievement: 02/17/20 Potential to Achieve Goals: Good Progress towards PT goals: Progressing toward goals    Frequency    7X/week      PT Plan Current plan remains appropriate    Co-evaluation              AM-PAC PT "6 Clicks" Mobility  Outcome Measure  Help needed turning from your back to your side while in a flat bed without using bedrails?: A Little Help needed moving from lying on your back to sitting on the side of a flat bed without using bedrails?: A Little Help needed moving to and from a bed to a chair (including a wheelchair)?: A Little Help needed standing up from a chair using your arms (e.g., wheelchair or bedside chair)?: A Little Help needed to walk in hospital room?: A Little Help needed climbing 3-5 steps with a railing? : A  Little 6 Click Score: 18    End of Session Equipment Utilized During Treatment: Gait belt Activity Tolerance: Patient limited by fatigue;Patient limited by pain Patient left: in bed;with call bell/phone within reach;with bed alarm set   PT Visit Diagnosis: Muscle weakness (generalized) (M62.81);Pain;Other abnormalities of gait and mobility (R26.89) Pain - Right/Left: Right Pain - part of body: Hip     Time: 1331-1350 PT Time Calculation (min) (ACUTE ONLY): 19 min  Charges:  $Gait Training: 8-22 mins                         Doreatha Massed, PT Acute Rehabilitation  Office: [phone] Pager: [phone]

## 2020-02-03 NOTE — Anesthesia Postprocedure Evaluation (Signed)
Anesthesia Post Note  Patient: Joyce [name]  Procedure(s) Performed: RIGHT TOTAL HIP ARTHROPLASTY ANTERIOR APPROACH (Right Hip)     Patient location during evaluation: PACU Anesthesia Type: General Level of consciousness: awake and alert Pain management: pain level controlled Vital Signs Assessment: post-procedure vital signs reviewed and stable Respiratory status: spontaneous breathing, nonlabored ventilation, respiratory function stable and patient connected to nasal cannula oxygen Cardiovascular status: blood pressure returned to baseline and stable Postop Assessment: no apparent nausea or vomiting Anesthetic complications: no   No complications documented.               Effie Berkshire

## 2020-02-03 NOTE — Plan of Care (Signed)
  Problem: Pain Management: Goal: Pain level will decrease with appropriate interventions Outcome: Progressing   

## 2020-02-03 NOTE — Plan of Care (Signed)
  Problem: Activity: Goal: Ability to tolerate increased activity will improve Outcome: Progressing   

## 2020-02-03 NOTE — Progress Notes (Signed)
PATIENT ID: Joyce [name]  MRN: [id number]  DOB/AGE:  Feb 15, 1941 / 79 y.o.  1 Day Post-Op Procedure(s) (LRB): RIGHT TOTAL HIP ARTHROPLASTY ANTERIOR APPROACH (Right)    PROGRESS NOTE Subjective: Patient is alert, oriented, no Nausea, no Vomiting, yes passing gas, . Taking PO well. Denies SOB, Chest or Calf Pain. Using Incentive Spirometer, PAS in place. Ambulate up to chair yesterdat Patient reports pain as  2/10  .    Objective: Vital signs in last 24 hours: Vitals:   02/02/20 2300 02/03/20 0300 02/03/20 0448 02/03/20 0710  BP: (Abnormal) 172/64 (Abnormal) 181/65 (Abnormal) 171/62 (Abnormal) 175/57  Pulse: 77 76 74 72  Resp: 16 18 17 16   Temp: 98.4 F (36.9 C) 98.4 F (36.9 C) 98.5 F (36.9 C) 98.4 F (36.9 C)  TempSrc: Oral Axillary Oral Oral  SpO2: 100% 100% 100% 98%      Intake/Output from previous day: I/O last 3 completed shifts: In: 8832 [P.O.:60; I.V.:2900; IV Piggyback:450] Out: 1200 [Urine:700; Emesis/NG output:100; Blood:400]   Intake/Output this shift: No intake/output data recorded.   LABORATORY DATA: Recent Labs    02/02/20 1122 02/02/20 1600 02/02/20 2213 02/03/20 0325  WBC  --   --   --  7.0  HGB  --   --   --  7.9*  HCT  --   --   --  25.0*  PLT  --   --   --  133*  NA  --   --   --  135  K  --   --   --  5.9*  CL  --   --   --  107  CO2  --   --   --  20*  BUN  --   --   --  46*  CREATININE  --   --   --  2.40*  GLUCOSE  --   --   --  189*  GLUCAP 123* 171* 172*  --   CALCIUM  --   --   --  8.2*    Examination: Neurologically intact ABD soft Neurovascular intact Sensation intact distally Intact pulses distally Dorsiflexion/Plantar flexion intact Incision: dressing C/D/I No cellulitis present Compartment soft} XR AP&Lat of hip shows well placed\fixed THA  Assessment:   1 Day Post-Op Procedure(s) (LRB): RIGHT TOTAL HIP ARTHROPLASTY ANTERIOR APPROACH (Right) ADDITIONAL DIAGNOSIS:  Expected Acute Blood Loss Anemia, Diabetes and  Hypertension, CHF,CAD,COPD, stage 4 CRF  Patient's anticipated LOS is less than 2 midnights, meeting these requirements: - Younger than 6 - Lives within 1 hour of care - Has a competent adult at home to recover with post-op recover - NO history of  - Chronic pain requiring opiods  - Diabetes  - Coronary Artery Disease  - Heart failure  - Heart attack  - Stroke  - DVT/VTE  - Cardiac arrhythmia  - Respiratory Failure/COPD  - Renal failure  - Anemia  - Advanced Liver disease       Plan: PT/OT WBAT, THA  DVT Prophylaxis: SCDx72 hrs, ASA 81 mg BID x 2 weeks  DISCHARGE PLAN: Home  DISCHARGE NEEDS: HHPT, Walker and 3-in-1 comode seatPatient ID: Joyce [name], female   DOB: April 18, 1941, 79 y.o.   MRN: [id number]

## 2020-02-03 NOTE — Evaluation (Signed)
Physical Therapy Evaluation Patient Details Name: Joyce [name] MRN: [id number] DOB: [birth year] Today's Date: 02/03/2020   History of Present Illness  79 yo female s/p R THA-direct anterior 02/02/20. Hx of CABG, NSTEMI, Dm, obesity  Clinical Impression  On eval, pt required Min assist for mobility. She walked ~20 feet with a RW. Moderate pain with activity. Distance limited by pain, fatigue. Do not anticipate pt will be ready to d/c home on today. Will plan to follow and progress activity as tolerated.     Follow Up Recommendations Follow surgeon's recommendation for DC plan and follow-up therapies    Equipment Recommendations  None recommended by PT    Recommendations for Other Services       Precautions / Restrictions Precautions Precautions: Fall Restrictions Weight Bearing Restrictions: No Other Position/Activity Restrictions: WBAT      Mobility  Bed Mobility Overal bed mobility: Needs Assistance Bed Mobility: Supine to Sit     Supine to sit: Min assist;HOB elevated     General bed mobility comments: Assist for R LE. Cues for pt to do as much as she can for herself. Increased time.  Transfers Overall transfer level: Needs assistance Equipment used: Rolling walker (2 wheeled) Transfers: Sit to/from Stand Sit to Stand: Min assist         General transfer comment: Assist to rise, stabilize, control descent. VCs safety, techniquer, hand placement. Stand pivot, bed to bsc, with RW  Ambulation/Gait Ambulation/Gait assistance: Min assist Gait Distance (Feet): 20 Feet Assistive device: Rolling walker (2 wheeled) Gait Pattern/deviations: Step-to pattern;Antalgic     General Gait Details: VCs safety, technique, sequence. Assist to stabilize throughout short distance. Distance limited by pain, fatigue.  Stairs            Wheelchair Mobility    Modified Rankin (Stroke Patients Only)       Balance Overall balance assessment: Needs assistance          Standing balance support: Bilateral upper extremity supported Standing balance-Leahy Scale: Poor                               Pertinent Vitals/Pain Pain Assessment: 0-10 Pain Score: 7  Pain Location: R hip Pain Descriptors / Indicators: Discomfort;Sore Pain Intervention(s): Limited activity within patient's tolerance;Monitored during session    [place] expects to be discharged to:: Private residence Living Arrangements: Alone Available Help at Discharge: Family (daughter) Type of Home: House Home Access: Stairs to enter Entrance Stairs-Rails: Right Entrance Stairs-Number of Steps: 2 Home Layout: Able to live on main level with bedroom/bathroom Home Equipment: Walker - 2 wheels;Cane - single point;Bedside commode      Prior Function Level of Independence: Independent               Hand Dominance        Extremity/Trunk Assessment   Upper Extremity Assessment Upper Extremity Assessment: Generalized weakness    Lower Extremity Assessment Lower Extremity Assessment: Generalized weakness    Cervical / Trunk Assessment Cervical / Trunk Assessment: Normal  Communication   Communication: No difficulties  Cognition Arousal/Alertness: Awake/alert Behavior During Therapy: WFL for tasks assessed/performed Overall Cognitive Status: Within Functional Limits for tasks assessed                                        General Comments  Exercises Total Joint Exercises Ankle Circles/Pumps: AROM;Both;10 reps Quad Sets: AROM;Both;10 reps Heel Slides: AAROM;Right;10 reps Hip ABduction/ADduction: AAROM;Right;10 reps   Assessment/Plan    PT Assessment Patient needs continued PT services  PT Problem List Decreased strength;Decreased range of motion;Decreased mobility;Decreased activity tolerance;Decreased balance;Pain       PT Treatment Interventions DME instruction;Gait training;Therapeutic activities;Therapeutic  exercise;Stair training;Balance training;Functional mobility training;Patient/family education    PT Goals (Current goals can be found in the Care Plan section)  Acute Rehab PT Goals Patient Stated Goal: less pain PT Goal Formulation: With patient Time For Goal Achievement: 02/17/20 Potential to Achieve Goals: Good    Frequency 7X/week   Barriers to discharge        Co-evaluation               AM-PAC PT "6 Clicks" Mobility  Outcome Measure Help needed turning from your back to your side while in a flat bed without using bedrails?: A Little Help needed moving from lying on your back to sitting on the side of a flat bed without using bedrails?: A Little Help needed moving to and from a bed to a chair (including a wheelchair)?: A Little Help needed standing up from a chair using your arms (e.g., wheelchair or bedside chair)?: A Little Help needed to walk in hospital room?: A Little Help needed climbing 3-5 steps with a railing? : A Little 6 Click Score: 18    End of Session Equipment Utilized During Treatment: Gait belt Activity Tolerance: Patient limited by fatigue;Patient limited by pain Patient left: in chair;with call bell/phone within reach   PT Visit Diagnosis: Muscle weakness (generalized) (M62.81);Pain Pain - Right/Left: Right Pain - part of body: Hip    Time: 2111-7356 PT Time Calculation (min) (ACUTE ONLY): 36 min   Charges:   PT Evaluation $PT Eval Low Complexity: 1 Low PT Treatments $Gait Training: 8-22 mins           Doreatha Massed, PT Acute Rehabilitation  Office: [phone] Pager: [phone]

## 2020-02-04 DIAGNOSIS — I2510 Atherosclerotic heart disease of native coronary artery without angina pectoris: Secondary | ICD-10-CM | POA: Diagnosis present

## 2020-02-04 DIAGNOSIS — Z7902 Long term (current) use of antithrombotics/antiplatelets: Secondary | ICD-10-CM | POA: Diagnosis not present

## 2020-02-04 DIAGNOSIS — E785 Hyperlipidemia, unspecified: Secondary | ICD-10-CM | POA: Diagnosis present

## 2020-02-04 DIAGNOSIS — G8929 Other chronic pain: Secondary | ICD-10-CM | POA: Diagnosis present

## 2020-02-04 DIAGNOSIS — E1122 Type 2 diabetes mellitus with diabetic chronic kidney disease: Secondary | ICD-10-CM | POA: Diagnosis present

## 2020-02-04 DIAGNOSIS — I130 Hypertensive heart and chronic kidney disease with heart failure and stage 1 through stage 4 chronic kidney disease, or unspecified chronic kidney disease: Secondary | ICD-10-CM | POA: Diagnosis present

## 2020-02-04 DIAGNOSIS — Z9181 History of falling: Secondary | ICD-10-CM | POA: Diagnosis not present

## 2020-02-04 DIAGNOSIS — N184 Chronic kidney disease, stage 4 (severe): Secondary | ICD-10-CM | POA: Diagnosis present

## 2020-02-04 DIAGNOSIS — Z79899 Other long term (current) drug therapy: Secondary | ICD-10-CM | POA: Diagnosis not present

## 2020-02-04 DIAGNOSIS — Z955 Presence of coronary angioplasty implant and graft: Secondary | ICD-10-CM | POA: Diagnosis not present

## 2020-02-04 DIAGNOSIS — M25751 Osteophyte, right hip: Secondary | ICD-10-CM | POA: Diagnosis present

## 2020-02-04 DIAGNOSIS — J449 Chronic obstructive pulmonary disease, unspecified: Secondary | ICD-10-CM | POA: Diagnosis present

## 2020-02-04 DIAGNOSIS — M545 Low back pain: Secondary | ICD-10-CM | POA: Diagnosis present

## 2020-02-04 DIAGNOSIS — E669 Obesity, unspecified: Secondary | ICD-10-CM | POA: Diagnosis present

## 2020-02-04 DIAGNOSIS — D62 Acute posthemorrhagic anemia: Secondary | ICD-10-CM | POA: Diagnosis not present

## 2020-02-04 DIAGNOSIS — M797 Fibromyalgia: Secondary | ICD-10-CM | POA: Diagnosis present

## 2020-02-04 DIAGNOSIS — Z981 Arthrodesis status: Secondary | ICD-10-CM | POA: Diagnosis not present

## 2020-02-04 DIAGNOSIS — I5032 Chronic diastolic (congestive) heart failure: Secondary | ICD-10-CM | POA: Diagnosis present

## 2020-02-04 DIAGNOSIS — M1611 Unilateral primary osteoarthritis, right hip: Secondary | ICD-10-CM | POA: Diagnosis present

## 2020-02-04 DIAGNOSIS — Z8701 Personal history of pneumonia (recurrent): Secondary | ICD-10-CM | POA: Diagnosis not present

## 2020-02-04 DIAGNOSIS — Z6833 Body mass index (BMI) 33.0-33.9, adult: Secondary | ICD-10-CM | POA: Diagnosis not present

## 2020-02-04 DIAGNOSIS — Z794 Long term (current) use of insulin: Secondary | ICD-10-CM | POA: Diagnosis not present

## 2020-02-04 DIAGNOSIS — Z951 Presence of aortocoronary bypass graft: Secondary | ICD-10-CM | POA: Diagnosis not present

## 2020-02-04 DIAGNOSIS — I252 Old myocardial infarction: Secondary | ICD-10-CM | POA: Diagnosis not present

## 2020-02-04 LAB — CBC
HCT: 22.4 % — ABNORMAL LOW (ref 36.0–46.0)
Hemoglobin: 7.2 g/dL — ABNORMAL LOW (ref 12.0–15.0)
MCH: 29.8 pg (ref 26.0–34.0)
MCHC: 32.1 g/dL (ref 30.0–36.0)
MCV: 92.6 fL (ref 80.0–100.0)
Platelets: 130 10*3/uL — ABNORMAL LOW (ref 150–400)
RBC: 2.42 MIL/uL — ABNORMAL LOW (ref 3.87–5.11)
RDW: 13.2 % (ref 11.5–15.5)
WBC: 5.6 10*3/uL (ref 4.0–10.5)
nRBC: 0 % (ref 0.0–0.2)

## 2020-02-04 LAB — GLUCOSE, CAPILLARY
Glucose-Capillary: 132 mg/dL — ABNORMAL HIGH (ref 70–99)
Glucose-Capillary: 176 mg/dL — ABNORMAL HIGH (ref 70–99)
Glucose-Capillary: 132 mg/dL — ABNORMAL HIGH (ref 70–99)
Glucose-Capillary: 136 mg/dL — ABNORMAL HIGH (ref 70–99)

## 2020-02-04 LAB — PREPARE RBC (CROSSMATCH)

## 2020-02-04 MED ORDER — SODIUM CHLORIDE 0.9% IV SOLUTION: Freq: Once | INTRAVENOUS | Status: DC

## 2020-02-04 NOTE — TOC Transition Note (Signed)
Transition of Care [facility]) - CM/SW Discharge Note   Patient Details  Name: Joyce [name] MRN: [id number] Date of Birth: 05-05-1941  Transition of Care [facility]) CM/SW Contact:  Lennart Pall, LCSW Phone Number: 02/04/2020, 3:18 PM   Clinical Narrative:    Met with pt to review d/c needs. Confirming pt already has needed DME at home.  HH referral places with [facility].  No further TOC needs.   Final next level of care: [place] Barriers to Discharge: No Barriers Identified   Patient Goals and CMS Choice Patient states their goals for this hospitalization and ongoing recovery are:: go home CMS Medicare.gov Compare Post Acute Care list provided to:: Patient Choice offered to / list presented to : Patient  Discharge Placement                       Discharge Plan and Services                DME Arranged: N/A DME Agency: NA       HH Arranged: PT [place] Agency: [facility], [place]) Date [facility] Agency Contacted: 02/04/20 Time [place]: 1518 Representative spoke with at [place]: [place] ([place]) Interventions     Readmission Risk Interventions No flowsheet data found.

## 2020-02-04 NOTE — Addendum Note (Signed)
Addendum  created 02/04/20 7182 by Merlinda Frederick, MD   Attestation recorded in [place], [place] filed

## 2020-02-04 NOTE — Progress Notes (Signed)
PATIENT ID: Joyce [name]  MRN: [id number]  DOB/AGE:  77/23/1942 / 79 y.o.  2 Days Post-Op Procedure(s) (LRB): RIGHT TOTAL HIP ARTHROPLASTY ANTERIOR APPROACH (Right)    PROGRESS NOTE Subjective: Patient is alert, oriented, no Nausea, no Vomiting, yes passing gas, . Taking PO well. Denies SOB, Chest or Calf Pain. Using Incentive Spirometer, PAS in place. Ambulate WBAT with pt walking 40 ft with therapy Patient reports pain as  moderate .    Objective: Vital signs in last 24 hours: Vitals:   02/03/20 1323 02/03/20 2228 02/04/20 0200 02/04/20 0550  BP: (!) 157/56 (!) 160/61  (!) 181/71  Pulse: 73 68  80  Resp: 16 17  18   Temp: 98.7 F (37.1 C) 98.2 F (36.8 C)  98.6 F (37 C)  TempSrc: Oral Oral  Oral  SpO2: 97% 99%  99%  Weight:   91.2 kg   Height:   5\' 5"  (1.651 m)       Intake/Output from previous day: I/O last 3 completed shifts: In: 5176 [P.O.:740; I.V.:2900] Out: 1800 [Urine:1700; Emesis/NG output:100]   Intake/Output this shift: No intake/output data recorded.   LABORATORY DATA: Recent Labs    02/02/20 2213 02/03/20 0325 02/03/20 0805 02/03/20 1154 02/03/20 1705 02/04/20 0251  WBC  --  7.0  --   --   --  5.6  HGB  --  7.9*  --   --   --  7.2*  HCT  --  25.0*  --   --   --  22.4*  PLT  --  133*  --   --   --  130*  NA  --  135  --   --   --   --   K  --  5.9*  --   --   --   --   CL  --  107  --   --   --   --   CO2  --  20*  --   --   --   --   BUN  --  46*  --   --   --   --   CREATININE  --  2.40*  --   --   --   --   GLUCOSE  --  189*  --   --   --   --   GLUCAP   < >  --  142* 190* 319*  --   CALCIUM  --  8.2*  --   --   --   --    < > = values in this interval not displayed.    Examination: Neurologically intact Neurovascular intact Sensation intact distally Intact pulses distally Dorsiflexion/Plantar flexion intact Incision: scant drainage No cellulitis present Compartment soft} XR AP&Lat of hip shows well placed\fixed  THA  Assessment:   2 Days Post-Op Procedure(s) (LRB): RIGHT TOTAL HIP ARTHROPLASTY ANTERIOR APPROACH (Right) ADDITIONAL DIAGNOSIS:  Expected Acute Blood Loss Anemia, Acute Blood Loss Anemia, Diabetes, Hypertension and CHF,CAD,COPD, stage 4 CRF Anticipated LOS equal to or greater than 2 midnights due to - Age 70 and older with one or more of the following:  - Obesity  - Expected need for hospital services (PT, OT, Nursing) required for safe  discharge  - Anticipated need for postoperative skilled nursing care or inpatient rehab  - Active co-morbidities: Diabetes and Anemia     Plan: PT/OT WBAT, THA  DVT Prophylaxis: SCDx72 hrs, ASA 81 mg BID x 2 weeks  DISCHARGE  PLAN: Home  DISCHARGE NEEDS: HHPT, Walker and 3-in-1 comode seat   Plan to give pt 1 unit RBC's as she is complaining of feeling weak and dizzy with thterapy.

## 2020-02-04 NOTE — Progress Notes (Signed)
PT Cancellation Note  Patient Details Name: Joyce [name] MRN: [id number] DOB: 02/26/1941   Cancelled Treatment:    Reason Eval/Treat Not Completed: Medical issues which prohibited therapy. Pt is to get 1 unit of blood this a.m. Will hold PT this a.m and check back this afternoon.    [place] [facility]  Office: [phone] Pager: [phone]

## 2020-02-04 NOTE — Plan of Care (Signed)

## 2020-02-04 NOTE — Progress Notes (Signed)
Physical Therapy Treatment Patient Details Name: Joyce [name] MRN: [id number] DOB: August 05, 1940 Today's Date: 02/04/2020    History of Present Illness 79 yo female s/p R THA-direct anterior 02/02/20. Hx of CABG, NSTEMI, Dm, obesity    PT Comments    Progressing slowly with mobility. Pt stated she still feels tired even after 1 unit of blood. Will plan to practice stair negotiation on tomorrow in preparation for possible d/c home.    Follow Up Recommendations  Follow surgeon's recommendation for DC plan and follow-up therapies     Equipment Recommendations  None recommended by PT    Recommendations for Other Services       Precautions / Restrictions Precautions Precautions: Fall Restrictions Weight Bearing Restrictions: No Other Position/Activity Restrictions: WBAT    Mobility  Bed Mobility Overal bed mobility: Needs Assistance Bed Mobility: Supine to Sit;Sit to Supine     Supine to sit: Min assist;HOB elevated Sit to supine: Min assist;HOB elevated   General bed mobility comments: Assist for R LE.  Increased time.  Transfers Overall transfer level: Needs assistance Equipment used: Rolling walker (2 wheeled) Transfers: Sit to/from Stand Sit to Stand: Min guard         General transfer comment: VCs safety, technique, hand placement.  Ambulation/Gait Ambulation/Gait assistance: Min guard Gait Distance (Feet): 52 Feet Assistive device: Rolling walker (2 wheeled) Gait Pattern/deviations: Step-to pattern     General Gait Details: VCs safety, technique, sequence. Assist to stabilize throughout short distance. Distance limited by fatigue.   Stairs             Wheelchair Mobility    Modified Rankin (Stroke Patients Only)       Balance Overall balance assessment: Needs assistance         Standing balance support: Bilateral upper extremity supported Standing balance-Leahy Scale: Poor                              Cognition  Arousal/Alertness: Awake/alert Behavior During Therapy: WFL for tasks assessed/performed Overall Cognitive Status: Within Functional Limits for tasks assessed                                        Exercises Total Joint Exercises Ankle Circles/Pumps: AROM;Both;10 reps Quad Sets: AROM;Both;10 reps Heel Slides: AAROM;Right;10 reps Hip ABduction/ADduction: AAROM;Right;10 reps    General Comments        Pertinent Vitals/Pain Pain Assessment: 0-10 Pain Score: 6  Pain Location: R hip Pain Descriptors / Indicators: Discomfort;Sore Pain Intervention(s): Monitored during session;Repositioned;Ice applied    Home Living                      Prior Function            PT Goals (current goals can now be found in the care plan section) Progress towards PT goals: Progressing toward goals    Frequency    7X/week      PT Plan Current plan remains appropriate    Co-evaluation              AM-PAC PT "6 Clicks" Mobility   Outcome Measure  Help needed turning from your back to your side while in a flat bed without using bedrails?: A Little Help needed moving from lying on your back to sitting on the side of a flat bed without  using bedrails?: A Little Help needed moving to and from a bed to a chair (including a wheelchair)?: A Little Help needed standing up from a chair using your arms (e.g., wheelchair or bedside chair)?: A Little Help needed to walk in hospital room?: A Little Help needed climbing 3-5 steps with a railing? : A Little 6 Click Score: 18    End of Session Equipment Utilized During Treatment: Gait belt Activity Tolerance: Patient limited by fatigue Patient left: in bed;with call bell/phone within reach;with bed alarm set   PT Visit Diagnosis: Muscle weakness (generalized) (M62.81);Pain;Other abnormalities of gait and mobility (R26.89) Pain - Right/Left: Right Pain - part of body: Hip     Time: 8159-4707 PT Time Calculation  (min) (ACUTE ONLY): 24 min  Charges:  $Gait Training: 8-22 mins $Therapeutic Exercise: 8-22 mins                         Doreatha Massed, PT Acute Rehabilitation  Office: [phone] Pager: [phone]

## 2020-02-05 ENCOUNTER — Ambulatory Visit: Payer: Medicare Other | Admitting: Emergency Medicine

## 2020-02-05 LAB — TYPE AND SCREEN
ABO/RH(D): O POS
Antibody Screen: NEGATIVE
Unit division: 0

## 2020-02-05 LAB — CBC
HCT: 24 % — ABNORMAL LOW (ref 36.0–46.0)
Hemoglobin: 7.8 g/dL — ABNORMAL LOW (ref 12.0–15.0)
MCH: 29.7 pg (ref 26.0–34.0)
MCHC: 32.5 g/dL (ref 30.0–36.0)
MCV: 91.3 fL (ref 80.0–100.0)
Platelets: 136 10*3/uL — ABNORMAL LOW (ref 150–400)
RBC: 2.63 MIL/uL — ABNORMAL LOW (ref 3.87–5.11)
RDW: 14 % (ref 11.5–15.5)
WBC: 6.5 10*3/uL (ref 4.0–10.5)
nRBC: 0 % (ref 0.0–0.2)

## 2020-02-05 LAB — BPAM RBC
Blood Product Expiration Date: 202108242359
ISSUE DATE / TIME: 202108111004
Unit Type and Rh: 5100

## 2020-02-05 LAB — GLUCOSE, CAPILLARY
Glucose-Capillary: 124 mg/dL — ABNORMAL HIGH (ref 70–99)
Glucose-Capillary: 88 mg/dL (ref 70–99)

## 2020-02-05 NOTE — Discharge Summary (Signed)
Patient ID: Joyce [name] MRN: [id number] DOB/AGE: October 11, 1940 79 y.o.  Admit date: 02/02/2020 Discharge date: 02/05/2020  Admission Diagnoses:  Principal Problem:   Osteoarthritis of right hip Active Problems:   Status post total hip replacement, right   Discharge Diagnoses:  Same  Past Medical History:  Diagnosis Date  . Anemia   . Arthritis   . Asthma   . Bilateral pleural effusion    Postoperative, status post Pleurx catheter and talc treatment - Dr. Prescott Gum  . 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 Chronic kidney disease (CKD), stage III (moderate)   . Chronic lower back pain   . COPD (chronic obstructive pulmonary disease) ([place])    pt. denies  . Essential hypertension   . Family history of adverse reaction to anesthesia    Mom was slow to wake up  . Fibromyalgia    neuropathy feet and fingers  . History of pneumonia   . Hyperlipidemia   . Myocardial infarction ([place])   . Type II diabetes mellitus ([place])     Surgeries: Procedure(s): RIGHT TOTAL HIP ARTHROPLASTY ANTERIOR APPROACH on 02/02/2020   Consultants:   Discharged Condition: Improved  Hospital Course: Joyce [name] is an 79 y.o. female who was admitted 02/02/2020 for operative treatment ofOsteoarthritis of right hip. Patient has severe unremitting pain that affects sleep, daily activities, and work/hobbies. After pre-op clearance the patient was taken to the operating room on 02/02/2020 and underwent  Procedure(s): RIGHT TOTAL HIP ARTHROPLASTY ANTERIOR APPROACH.    Patient was given perioperative antibiotics:  Anti-infectives (From admission, onward)   Start     Dose/Rate Route Frequency Ordered Stop   02/02/20 1119  ceFAZolin (ANCEF) 2-4 GM/100ML-% IVPB       Note to Pharmacy: Charmayne Sheer   : cabinet override      02/02/20 1119 02/02/20 1340   02/02/20 1115  ceFAZolin (ANCEF) IVPB 2g/100 mL premix        2  g 200 mL/hr over 30 Minutes Intravenous On call to O.R. 02/02/20 1108 02/02/20 1353       Patient was given sequential compression devices, early ambulation, and chemoprophylaxis to prevent DVT.  Patient benefited maximally from hospital stay and there were no complications.    Recent vital signs:  Patient Vitals for the past 24 hrs:  BP Temp Temp src Pulse Resp SpO2  02/05/20 0629 (!) 183/51 99.1 F (37.3 C) Oral 76 -- 95 %  02/04/20 2223 (!) 148/44 99.9 F (37.7 C) -- 80 17 95 %  02/04/20 1331 (!) 176/62 99 F (37.2 C) Oral 80 14 97 %  02/04/20 1033 (!) 197/64 99 F (37.2 C) Oral 90 14 98 %  02/04/20 1005 (!) 194/63 -- Oral 82 14 97 %     Recent laboratory studies:  Recent Labs    02/03/20 0325 02/03/20 0325 02/04/20 0251 02/05/20 0311  WBC 7.0   < > 5.6 6.5  HGB 7.9*   < > 7.2* 7.8*  HCT 25.0*   < > 22.4* 24.0*  PLT 133*   < > 130* 136*  NA 135  --   --   --   K 5.9*  --   --   --   CL 107  --   --   --   CO2 20*  --   --   --   BUN  46*  --   --   --   CREATININE 2.40*  --   --   --   GLUCOSE 189*  --   --   --   CALCIUM 8.2*  --   --   --    < > = values in this interval not displayed.     Discharge Medications:   Allergies as of 02/05/2020      Reactions   Carvedilol Other (See Comments)   confusion   Codeine Other (See Comments)   confusion   Metoprolol Palpitations      Medication List    STOP taking these medications   acetaminophen 325 MG tablet Commonly known as: TYLENOL   Glucerna Liqd     TAKE these medications   Accu-Chek Aviva Plus test strip Generic drug: glucose blood Inject 1 strip as directed 4 (four) times daily.   albuterol 108 (90 Base) MCG/ACT inhaler Commonly known as: VENTOLIN HFA Inhale 2 puffs into the lungs every 4 (four) hours as needed for wheezing or shortness of breath.   amLODipine 10 MG tablet Commonly known as: NORVASC Take 10 mg by mouth daily.   aspirin EC 81 MG tablet Take 1 tablet (81 mg total) by  mouth 2 (two) times daily.   atorvastatin 40 MG tablet Commonly known as: LIPITOR Take 40 mg by mouth every evening.   busPIRone 5 MG tablet Commonly known as: BUSPAR Take 5 mg by mouth daily as needed (anxiety).   calcitRIOL 0.25 MCG capsule Commonly known as: ROCALTROL Take 0.25 mcg by mouth every Monday, Wednesday, and Friday.   clopidogrel 75 MG tablet Commonly known as: PLAVIX TAKE ONE TABLET BY MOUTH DAILY WITH BREAKFAST What changed: when to take this   FeroSul 325 (65 FE) MG tablet Generic drug: ferrous sulfate TAKE ONE TABLET BY MOUTH DAILY What changed:   how much to take  when to take this   fluticasone 50 MCG/ACT nasal spray Commonly known as: FLONASE Place 1 spray into both nostrils daily.   furosemide 40 MG tablet Commonly known as: LASIX Take 40 mg by mouth daily.   hydrALAZINE 100 MG tablet Commonly known as: APRESOLINE Take 100 mg by mouth 3 (three) times daily.   HYDROmorphone 2 MG tablet Commonly known as: Dilaudid Take 1 tablet (2 mg total) by mouth every 4 (four) hours as needed for severe pain.   insulin glargine 100 UNIT/ML injection Commonly known as: LANTUS Inject 40 Units into the skin at bedtime.   insulin lispro 100 UNIT/ML KiwkPen Commonly known as: HUMALOG Inject 8 Units into the skin 3 (three) times daily with meals.   INSULIN SYRINGE .5CC/30GX5/16" 30G X 5/16" 0.5 ML Misc Inject 0.5 mLs as directed 3 (three) times daily.   isosorbide mononitrate 60 MG 24 hr tablet Commonly known as: IMDUR TAKE ONE TABLET BY MOUTH DAILY   linaclotide 145 MCG Caps capsule Commonly known as: Linzess Take 1 capsule (145 mcg total) by mouth daily before breakfast. Take 30 min before breakfast   lisinopril 5 MG tablet Commonly known as: ZESTRIL Take 5 mg by mouth every evening.   montelukast 10 MG tablet Commonly known as: SINGULAIR Take 10 mg by mouth at bedtime.   multivitamin with minerals Tabs tablet Take 1 tablet by mouth daily.    naloxone 4 MG/0.1ML Liqd nasal spray kit Commonly known as: NARCAN Place 1 spray into the nose daily as needed (accidental overdose.). Use as directed.   nitroGLYCERIN 0.4 MG SL tablet  Commonly known as: NITROSTAT Place 1 tablet (0.4 mg total) under the tongue every 5 (five) minutes x 3 doses as needed for chest pain.   polyethylene glycol 17 g packet Commonly known as: MIRALAX / GLYCOLAX Take 17 g by mouth daily as needed for moderate constipation.   primidone 50 MG tablet Commonly known as: MYSOLINE Take 100 mg by mouth daily.   PROBIOTIC DAILY PO Take 1 capsule by mouth in the morning, at noon, and at bedtime.   psyllium 58.6 % packet Commonly known as: METAMUCIL Take by mouth daily.   rOPINIRole 1 MG tablet Commonly known as: REQUIP Take 1 mg by mouth 3 (three) times daily as needed (muscle spasms).   Stiolto Respimat 2.5-2.5 MCG/ACT Aers Generic drug: Tiotropium Bromide-Olodaterol Take 2 puffs by mouth daily.   tiZANidine 2 MG tablet Commonly known as: ZANAFLEX Take 1 tablet (2 mg total) by mouth every 6 (six) hours as needed.   traMADol 50 MG tablet Commonly known as: ULTRAM Take 100 mg by mouth every 8 (eight) hours as needed for moderate pain.   Vitamin D3 50 MCG (2000 UT) Tabs Take 4,000-6,000 Units by mouth See admin instructions. Take 4000 units by mouth every other day alternating with 6000 units on alternate days            Durable Medical Equipment  (From admission, onward)         Start     Ordered   02/02/20 1726  DME Walker rolling  Once       Question:  Patient needs a walker to treat with the following condition  Answer:  Status post right hip replacement   02/02/20 1725   02/02/20 1726  DME 3 n 1  Once        02/02/20 1725           Discharge Care Instructions  (From admission, onward)         Start     Ordered   02/05/20 0000  Weight bearing as tolerated        02/05/20 0800          Diagnostic Studies: DG Chest 2  View  Result Date: 01/22/2020 CLINICAL DATA:  Preoperative assessment prior to hip surgery. EXAM: CHEST - 2 VIEW COMPARISON:  Chest radiograph dated 05/13/2019. FINDINGS: The heart size and mediastinal contours are within normal limits. Both lungs are clear. Median sternotomy wires are redemonstrated. Fixation hardware overlies the cervical spine. Degenerative changes are seen in the thoracic spine. IMPRESSION: No active cardiopulmonary disease. Electronically Signed   By: Zerita Boers M.D.   On: 01/22/2020 19:47   DG C-Arm 1-60 Min-No Report  Result Date: 02/02/2020 Fluoroscopy was utilized by the requesting physician.  No radiographic interpretation.   DG HIP OPERATIVE UNILAT W OR W/O PELVIS RIGHT  Result Date: 02/02/2020 CLINICAL DATA:  Right anterior hip replacement. EXAM: OPERATIVE RIGHT HIP (WITH PELVIS IF PERFORMED) TECHNIQUE: Fluoroscopic spot image(s) were submitted for interpretation post-operatively. COMPARISON:  None. FINDINGS: Four fluoroscopic spot views in the operating room obtained. Right hip arthroplasty. Total fluoroscopy time 15 seconds. IMPRESSION: Intraoperative fluoroscopy during right hip arthroplasty. Electronically Signed   By: Keith Rake M.D.   On: 02/02/2020 15:24    Disposition: Discharge disposition: 01-Home or Self Care       Discharge Instructions    Call MD / Call 911   Complete by: As directed    If you experience chest pain or shortness of breath, CALL 911  and be transported to the hospital emergency room.  If you develope a fever above 101 F, pus (white drainage) or increased drainage or redness at the wound, or calf pain, call your surgeon's office.   Constipation Prevention   Complete by: As directed    Drink plenty of fluids.  Prune juice may be helpful.  You may use a stool softener, such as Colace (over the counter) 100 mg twice a day.  Use MiraLax (over the counter) for constipation as needed.   Diet - low sodium heart healthy   Complete by:  As directed    Driving restrictions   Complete by: As directed    No driving for 2 weeks   Increase activity slowly as tolerated   Complete by: As directed    Patient may shower   Complete by: As directed    You may shower without a dressing once there is no drainage.  Do not wash over the wound.  If drainage remains, cover wound with plastic wrap and then shower.   Weight bearing as tolerated   Complete by: As directed        Follow-up Information    Frederik Pear, MD In 2 weeks.   Specialty: Orthopedic Surgery Contact information: [place] [address] [phone]        [facility] Follow up.   Why: to provide home health physical therapy Contact information: [phone]               Signed: Joanell Rising 02/05/2020, 8:01 AM

## 2020-02-05 NOTE — Progress Notes (Signed)
PATIENT ID: Joyce [name]  MRN: [id number]  DOB/AGE:  11/22/1940 / 80 y.o.  3 Days Post-Op Procedure(s) (LRB): RIGHT TOTAL HIP ARTHROPLASTY ANTERIOR APPROACH (Right)    PROGRESS NOTE Subjective: Patient is alert, oriented, no Nausea, no Vomiting, yes passing gas, . Taking PO well. Denies SOB, Chest or Calf Pain. Using Incentive Spirometer, PAS in place. Ambulate WBAT with pt walking 50 ft with therapy Patient reports pain as  Mild to moderate .    Objective: Vital signs in last 24 hours: Vitals:   02/04/20 1033 02/04/20 1331 02/04/20 2223 02/05/20 0629  BP: (!) 197/64 (!) 176/62 (!) 148/44 (!) 183/51  Pulse: 90 80 80 76  Resp: 14 14 17    Temp: 99 F (37.2 C) 99 F (37.2 C) 99.9 F (37.7 C) 99.1 F (37.3 C)  TempSrc: Oral Oral  Oral  SpO2: 98% 97% 95% 95%  Weight:      Height:          Intake/Output from previous day: I/O last 3 completed shifts: In: 3290.3 [P.O.:740; I.V.:2156.3; Blood:394] Out: 3500 [Urine:3500]   Intake/Output this shift: No intake/output data recorded.   LABORATORY DATA: Recent Labs    02/03/20 0325 02/03/20 0805 02/04/20 0251 02/04/20 0738 02/04/20 1701 02/04/20 2157 02/05/20 0311 02/05/20 0741  WBC 7.0  --  5.6  --   --   --  6.5  --   HGB 7.9*  --  7.2*  --   --   --  7.8*  --   HCT 25.0*  --  22.4*  --   --   --  24.0*  --   PLT 133*  --  130*  --   --   --  136*  --   NA 135  --   --   --   --   --   --   --   K 5.9*  --   --   --   --   --   --   --   CL 107  --   --   --   --   --   --   --   CO2 20*  --   --   --   --   --   --   --   BUN 46*  --   --   --   --   --   --   --   CREATININE 2.40*  --   --   --   --   --   --   --   GLUCOSE 189*  --   --   --   --   --   --   --   GLUCAP  --    < >  --    < > 132* 136*  --  124*  CALCIUM 8.2*  --   --   --   --   --   --   --    < > = values in this interval not displayed.    Examination: Neurologically intact Neurovascular intact Sensation intact distally Intact pulses  distally Dorsiflexion/Plantar flexion intact Incision: scant drainage No cellulitis present Compartment soft} XR AP&Lat of hip shows well placed\fixed THA  Assessment:   3 Days Post-Op Procedure(s) (LRB): RIGHT TOTAL HIP ARTHROPLASTY ANTERIOR APPROACH (Right) ADDITIONAL DIAGNOSIS:  Expected Acute Blood Loss Anemia, Acute Blood Loss Anemia, Diabetes, Hypertension and CHF,CAD,COPD, stage 4 CRF Anticipated LOS equal to or greater  than 2 midnights due to - Age 29 and older with one or more of the following:  - Obesity  - Expected need for hospital services (PT, OT, Nursing) required for safe  discharge  - Anticipated need for postoperative skilled nursing care or inpatient rehab  - Active co-morbidities: Diabetes and Respiratory Failure/COPD   Plan: PT/OT WBAT, THA  DVT Prophylaxis: SCDx72 hrs, ASA 81 mg BID x 2 weeks  DISCHARGE PLAN: Home, later today once pt meets therapy goals  DISCHARGE NEEDS: HHPT, Walker and 3-in-1 comode seat

## 2020-02-05 NOTE — Progress Notes (Signed)
Physical Therapy Treatment Patient Details Name: Joyce [name] MRN: [id number] DOB: [birth year] Today's Date: 02/05/2020    History of Present Illness 79 yo female s/p R THA-direct anterior 02/02/20. Hx of CABG, NSTEMI, Dm, obesity    PT Comments    Pt ambulated in hallway again and assisted back to bed.  Pt reports she was performing exercises prior to therapist arrival and was fatigued.  Pt does wish to d/c home today so verbally reviewed exercises with handout and pt reports understanding.  Pt also to have HHPT upon d/c.  Pt agreeable to have family assist with mobility and use gait belt for safety initially.  Pt to d/c home today.    Follow Up Recommendations  Follow surgeons recommendation for DC plan and follow-up therapies;Home health PT     Equipment Recommendations  None recommended by PT    Recommendations for Other Services       Precautions / Restrictions Precautions Precautions: Fall Restrictions Weight Bearing Restrictions: No    Mobility  Bed Mobility Overal bed mobility: Needs Assistance Bed Mobility: Sit to Supine     Supine to sit: Min guard Sit to supine: Min guard   General bed mobility comments: pt self assisted R LE  Transfers Overall transfer level: Needs assistance Equipment used: Rolling walker (2 wheeled) Transfers: Sit to/from Stand Sit to Stand: Min guard         General transfer comment: verbal cues for UE and LE positioning  Ambulation/Gait Ambulation/Gait assistance: Min guard Gait Distance (Feet): 50 Feet Assistive device: Rolling walker (2 wheeled) Gait Pattern/deviations: Step-to pattern;Antalgic     General Gait Details: verbal cues for sequence, RW positioning, posture; pt encouraged to have chairs around home as she will likely fatigue quickly (states she was not exercising or moving like she should have prior to this surgery)   Stairs    Wheelchair Mobility    Modified Rankin (Stroke Patients Only)        Balance                                            Cognition Arousal/Alertness: Awake/alert Behavior During Therapy: WFL for tasks assessed/performed Overall Cognitive Status: Within Functional Limits for tasks assessed                                        Exercises      General Comments        Pertinent Vitals/Pain Pain Assessment: 0-10 Pain Score: 5  Pain Location: R hip Pain Descriptors / Indicators: Discomfort;Sore Pain Intervention(s): Repositioned;Monitored during session    Home Living                      Prior Function            PT Goals (current goals can now be found in the care plan section) Progress towards PT goals: Progressing toward goals    Frequency    7X/week      PT Plan Current plan remains appropriate    Co-evaluation              AM-PAC PT "6 Clicks" Mobility   Outcome Measure  Help needed turning from your back to your side while in a flat bed without using bedrails?:  A Little Help needed moving from lying on your back to sitting on the side of a flat bed without using bedrails?: A Little Help needed moving to and from a bed to a chair (including a wheelchair)?: A Little Help needed standing up from a chair using your arms (e.g., wheelchair or bedside chair)?: A Little Help needed to walk in hospital room?: A Little Help needed climbing 3-5 steps with a railing? : A Little 6 Click Score: 18    End of Session Equipment Utilized During Treatment: Gait belt Activity Tolerance: Patient limited by fatigue Patient left: in bed;with call bell/phone within reach   PT Visit Diagnosis: Muscle weakness (generalized) (M62.81);Other abnormalities of gait and mobility (R26.89)     Time: 2060-1561 PT Time Calculation (min) (ACUTE ONLY): 12 min  Charges:  $Gait Training: 8-22 mins                     Joyce [name], DPT Acute Rehabilitation Services Pager: [phone] Office:  [phone]  Joyce [name] E 02/05/2020, 2:32 PM

## 2020-02-05 NOTE — Progress Notes (Signed)
Physical Therapy Treatment Patient Details Name: Joyce [name] MRN: [id number] DOB: Dec 13, 1940 Today's Date: 02/05/2020    History of Present Illness 79 yo female s/p R THA-direct anterior 02/02/20. Hx of CABG, NSTEMI, Dm, obesity    PT Comments    Pt assisted with ambulating in hallway and practiced safe stair technique.  Pt fatigued quickly however and required recliner upon ambulating back to room.  Will return for afternoon session.   Follow Up Recommendations  Follow surgeon's recommendation for DC plan and follow-up therapies;Home health PT     Equipment Recommendations  None recommended by PT    Recommendations for Other Services       Precautions / Restrictions Precautions Precautions: Fall Restrictions Weight Bearing Restrictions: No    Mobility  Bed Mobility Overal bed mobility: Needs Assistance Bed Mobility: Supine to Sit     Supine to sit: Min guard     General bed mobility comments: pt self assisted R LE  Transfers Overall transfer level: Needs assistance Equipment used: Rolling walker (2 wheeled) Transfers: Sit to/from Stand Sit to Stand: Min guard         General transfer comment: verbal cues for UE and LE positioning  Ambulation/Gait Ambulation/Gait assistance: Min assist;Min guard Gait Distance (Feet): 35 Feet Assistive device: Rolling walker (2 wheeled) Gait Pattern/deviations: Step-to pattern;Antalgic     General Gait Details: verbal cues for sequence, RW positioning, posture, pt with LE weakness and buckling upon return to room and requested recliner   Stairs Stairs: Yes Stairs assistance: Min guard Stair Management: Step to pattern;Forwards;One rail Right Number of Stairs: 2 General stair comments: verbal cues for sequence and safety, min/guard for safety, pt aware to have another person available for assist if needed and use gait belt   Wheelchair Mobility    Modified Rankin (Stroke Patients Only)       Balance                                             Cognition Arousal/Alertness: Awake/alert Behavior During Therapy: WFL for tasks assessed/performed Overall Cognitive Status: Within Functional Limits for tasks assessed                                        Exercises      General Comments        Pertinent Vitals/Pain Pain Assessment: 0-10 Pain Score: 4  Pain Location: R hip Pain Descriptors / Indicators: Discomfort;Sore Pain Intervention(s): Repositioned;Monitored during session;Premedicated before session    Home Living                      Prior Function            PT Goals (current goals can now be found in the care plan section) Progress towards PT goals: Progressing toward goals    Frequency    7X/week      PT Plan Current plan remains appropriate    Co-evaluation              AM-PAC PT "6 Clicks" Mobility   Outcome Measure  Help needed turning from your back to your side while in a flat bed without using bedrails?: A Little Help needed moving from lying on your back to sitting on the side  of a flat bed without using bedrails?: A Little Help needed moving to and from a bed to a chair (including a wheelchair)?: A Little Help needed standing up from a chair using your arms (e.g., wheelchair or bedside chair)?: A Little Help needed to walk in hospital room?: A Little Help needed climbing 3-5 steps with a railing? : A Little 6 Click Score: 18    End of Session Equipment Utilized During Treatment: Gait belt Activity Tolerance: Patient limited by fatigue Patient left: with call bell/phone within reach;in chair;with chair alarm set   PT Visit Diagnosis: Muscle weakness (generalized) (M62.81);Other abnormalities of gait and mobility (R26.89)     Time: 1040-1057 PT Time Calculation (min) (ACUTE ONLY): 17 min  Charges:  $Gait Training: 8-22 mins                    Arlyce Dice, DPT Acute Rehabilitation Services Pager:  [phone] Office: [phone]   Trena Platt 02/05/2020, 2:28 PM

## 2020-02-05 NOTE — Plan of Care (Signed)
  Problem: Education: Goal: Understanding of discharge needs will improve Outcome: Completed/Met   Problem: Activity: Goal: Ability to avoid complications of mobility impairment will improve Outcome: Completed/Met   Problem: Activity: Goal: Ability to tolerate increased activity will improve Outcome: Completed/Met

## 2020-03-03 ENCOUNTER — Other Ambulatory Visit: Payer: Self-pay

## 2020-03-03 ENCOUNTER — Encounter: Payer: Self-pay | Admitting: Family Medicine

## 2020-03-03 ENCOUNTER — Ambulatory Visit: Payer: Medicare Other | Admitting: Family Medicine

## 2020-03-03 VITALS — BP 168/60 | HR 74 | Ht 65.0 in | Wt 202.8 lb

## 2020-03-03 DIAGNOSIS — I2510 Atherosclerotic heart disease of native coronary artery without angina pectoris: Secondary | ICD-10-CM | POA: Diagnosis not present

## 2020-03-03 DIAGNOSIS — N183 Chronic kidney disease, stage 3 unspecified: Secondary | ICD-10-CM

## 2020-03-03 DIAGNOSIS — E782 Mixed hyperlipidemia: Secondary | ICD-10-CM

## 2020-03-03 NOTE — Patient Instructions (Addendum)
Medication Instructions:  Your physician recommends that you continue on your current medications as directed. Please refer to the Current Medication list given to you today.   Labwork: None  Testing/Procedures: None  Follow-Up: Your physician recommends that you schedule a follow-up appointment in: 3 months  Any Other Special Instructions Will Be Listed Below (If Applicable).     If you need a refill on your cardiac medications before your next appointment, please call your pharmacy.   

## 2020-03-03 NOTE — Progress Notes (Signed)
Cardiology Office Note  Date: 03/03/2020   ID: DANELY BAYLISS, DOB [birth year], MRN [id number]  PCP:  Allie Dimmer, MD  Cardiologist:  Rozann Lesches, MD Electrophysiologist:  None   Chief Complaint: Follow-up CAD, recently elevated troponins  History of Present Illness: Joyce [name] is a 79 y.o. female with a history of CAD (status post CABG 2015.,  Coronary atherectomy 2019 with stent placement).  HLD, stage III chronic kidney disease.  Last encounter with Dr. Domenic Polite via telemedicine on 09/29/2019.  At that time she denied any progressive anginal symptoms on medical therapy.  Her main concern was back and hip pain.  At that time she was anticipating having right hip surgery.  Her current cardiac regimen including Plavix, Norvasc, Lipitor, Lasix, hydralazine, Imdur, lisinopril.  Her recent LDL was 48.  Patient is here status post right total hip arthroplasty by Dr. Mayer Camel.  She was sent to [facility] for rehab status post hip surgery.  She apparently had labs drawn there on 02/23/2020.  She had an initial troponin that was less than 0.3.  Subsequent troponin of 0.38.  Patient states she was having no chest pain or shortness of breath or any other anginal symptoms that might have prompted ordering troponins in the SNF during rehab.  She was therefore sent to our clinic for an elevated troponin.  She states she has been doing well from an orthopedic standpoint.  She has been working with PT and she is walking with a walker now without any difficulty.  She is here by herself today.  She denies any recent anginal or exertional symptoms.  Her blood pressure is elevated today at 168/60.Marland Kitchen  She has chronic anemia which she states she has had for quite some time since the birth of her children.  Her lab work on 02/20/2020 showed a hemoglobin of 7.1 and hematocrit of 21.6.  Her complete metabolic panel showed glucose of 108, calcium of 8, BUN of 47, creatinine of 2.26.  Sodium 132.   Estimated GFR 20.3.  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  She has longstanding insulin-dependent diabetes..  She developed swelling and pain in her right lower extremity.  She came back to the emergency room on 02/07/2020 admitted for work-up of DVT. She was started on Eliquis twice daily for 30 days of anticoagulation.  Doppler study was negative for DVT.  She had some urinary retention during hospital stay.  She had a Foley catheter placed with 1500 mL return.  She had a subsequent UTI as a result.  She was started on Eliquis twice daily for 30 days of anticoagulation.   From cardiac standpoint patient states she has been doing well.  She denies any anginal or exertional symptoms, palpitations or arrhythmias, orthostatic symptoms, stroke or TIA-like symptoms, lower extremity edema, claudication-like symptoms, evidence of DVT or PE-like symptoms.  She was given Eliquis 5 mg p.o. twice daily for 30 days for suspected DVT.  However the Doppler study showed no evidence of DVT in her right lower extremity.  Past Medical History:  Diagnosis Date  . Anemia   . Arthritis   . Asthma   . Bilateral pleural effusion    Postoperative, status post Pleurx catheter and talc treatment - Dr. Prescott Gum  . 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 Chronic kidney disease (CKD), stage III (  moderate)   . Chronic lower back pain   . COPD (chronic obstructive pulmonary disease) ([place])    pt. denies  . Essential hypertension   . Family history of adverse reaction to anesthesia    Mom was slow to wake up  . Fibromyalgia    neuropathy feet and fingers  . History of pneumonia   . Hyperlipidemia   . Myocardial infarction ([place])   . Type II diabetes mellitus ([place])     Past Surgical History:  Procedure Laterality Date  . ABDOMINAL HYSTERECTOMY  1980's  . ANTERIOR CERVICAL DECOMP/DISCECTOMY FUSION  2000's  .  APPENDECTOMY  1970's  . BACK SURGERY    . BIOPSY  08/28/2019   Procedure: BIOPSY;  Surgeon: Rogene Houston, MD;  [facility];  Service: Endoscopy;;  duodenal  . CHEST TUBE INSERTION Left 08/07/2014   Procedure: INSERTION PLEURAL DRAINAGE CATHETER;  Surgeon: Ivin Poot, MD;  Location: [facility];  Service: Thoracic;  Laterality: Left;  . CHEST TUBE INSERTION Right 08/12/2014   Procedure: INSERTION PLEURAL DRAINAGE CATHETER;  Surgeon: Ivin Poot, MD;  Location: [facility];  Service: Thoracic;  Laterality: Right;  . CHOLECYSTECTOMY  ~ 2005  . COLONOSCOPY N/A 08/28/2019   Procedure: COLONOSCOPY;  Surgeon: Rogene Houston, MD;  [facility];  Service: Endoscopy;  Laterality: N/A;  100  . CORONARY ARTERY BYPASS GRAFT N/A 06/15/2014   Procedure: CORONARY ARTERY BYPASS GRAFTING (CABG);  Surgeon: Ivin Poot, MD;  Location: [facility];  Service: Open Heart Surgery;  Laterality: N/A;  . CORONARY ATHERECTOMY N/A 05/22/2018   Procedure: CORONARY ATHERECTOMY;  Surgeon: Belva Crome, MD;  Location: [facility];  Service: Cardiovascular;  Laterality: N/A;  . CORONARY STENT INTERVENTION N/A 05/22/2018   Procedure: CORONARY STENT INTERVENTION;  Surgeon: Belva Crome, MD;  Location: [facility];  Service: Cardiovascular;  Laterality: N/A;  rca   . ESOPHAGOGASTRODUODENOSCOPY N/A 08/28/2019   Procedure: ESOPHAGOGASTRODUODENOSCOPY (EGD);  Surgeon: Rogene Houston, MD;  [facility];  Service: Endoscopy;  Laterality: N/A;  . INTRAOPERATIVE TRANSESOPHAGEAL ECHOCARDIOGRAM N/A 06/15/2014   Procedure: INTRAOPERATIVE TRANSESOPHAGEAL ECHOCARDIOGRAM;  Surgeon: Ivin Poot, MD;  Location: [facility];  Service: Open Heart Surgery;  Laterality: N/A;  . LEFT HEART CATH AND CORS/GRAFTS ANGIOGRAPHY N/A 05/20/2018   Procedure: LEFT HEART CATH AND CORS/GRAFTS ANGIOGRAPHY;  Surgeon: Lorretta Harp, MD;  Location: [facility];  Service: Cardiovascular;  Laterality: N/A;  .  LEFT HEART CATHETERIZATION WITH CORONARY ANGIOGRAM N/A 06/11/2014   Procedure: LEFT HEART CATHETERIZATION WITH CORONARY ANGIOGRAM;  Surgeon: Blane Ohara, MD;  Location: [facility];  Service: Cardiovascular;  Laterality: N/A;  . LUMBAR DISC SURGERY  1980's X 1; 1990's X  2000's X 1  . POLYPECTOMY  08/28/2019   Procedure: POLYPECTOMY;  Surgeon: Rogene Houston, MD;  [facility];  Service: Endoscopy;;  . REMOVAL OF PLEURAL DRAINAGE CATHETER Bilateral 02/11/2015   Procedure: REMOVAL OF PLEURAL DRAINAGE CATHETER;  Surgeon: Ivin Poot, MD;  Location: [facility];  Service: Thoracic;  Laterality: Bilateral;  . TALC PLEURODESIS Bilateral 02/11/2015   Procedure: Pietro Cassis;  Surgeon: Ivin Poot, MD;  Location: [facility];  Service: Thoracic;  Laterality: Bilateral;  . TONSILLECTOMY  ~ 1950  . TOTAL HIP ARTHROPLASTY Right 02/02/2020   Procedure: RIGHT TOTAL HIP ARTHROPLASTY ANTERIOR APPROACH;  Surgeon: Frederik Pear, MD;  Location: [facility];  Service: Orthopedics;  Laterality: Right;  Current Outpatient Medications  Medication Sig Dispense Refill  . amLODipine (NORVASC) 10 MG tablet Take 10 mg by mouth daily.   5  . apixaban (ELIQUIS) 5 MG TABS tablet Take 5 mg by mouth 2 (two) times daily.    Marland Kitchen aspirin EC 81 MG tablet Take 1 tablet (81 mg total) by mouth 2 (two) times daily. 60 tablet 0  . atorvastatin (LIPITOR) 40 MG tablet Take 40 mg by mouth every evening.     . clopidogrel (PLAVIX) 75 MG tablet TAKE ONE TABLET BY MOUTH DAILY WITH BREAKFAST (Patient taking differently: Take 75 mg by mouth daily. ) 90 tablet 0  . furosemide (LASIX) 40 MG tablet Take 40 mg by mouth daily.    . hydrALAZINE (APRESOLINE) 100 MG tablet Take 100 mg by mouth 3 (three) times daily.    . isosorbide mononitrate (IMDUR) 60 MG 24 hr tablet TAKE ONE TABLET BY MOUTH DAILY (Patient taking differently: Take 60 mg by mouth daily. ) 90 tablet 2  . lisinopril (PRINIVIL,ZESTRIL) 5 MG tablet Take 5 mg by mouth every  evening.     Marland Kitchen ACCU-CHEK AVIVA PLUS test strip Inject 1 strip as directed 4 (four) times daily.  2  . albuterol (VENTOLIN HFA) 108 (90 Base) MCG/ACT inhaler Inhale 2 puffs into the lungs every 4 (four) hours as needed for wheezing or shortness of breath. 8 g 5  . busPIRone (BUSPAR) 5 MG tablet Take 5 mg by mouth daily as needed (anxiety).    . calcitRIOL (ROCALTROL) 0.25 MCG capsule Take 0.25 mcg by mouth every Monday, Wednesday, and Friday.    . Cholecalciferol (VITAMIN D3) 2000 units TABS Take 4,000-6,000 Units by mouth See admin instructions. Take 4000 units by mouth every other day alternating with 6000 units on alternate days    . ciprofloxacin (CIPRO) 250 MG tablet Take 250 mg by mouth 2 (two) times daily.    . FEROSUL 325 (65 Fe) MG tablet TAKE ONE TABLET BY MOUTH DAILY (Patient taking differently: Take 325 mg by mouth every Monday, Wednesday, and Friday. ) 60 tablet 5  . fluticasone (FLONASE) 50 MCG/ACT nasal spray Place 1 spray into both nostrils daily.     Marland Kitchen HYDROmorphone (DILAUDID) 2 MG tablet Take 1 tablet (2 mg total) by mouth every 4 (four) hours as needed for severe pain. 30 tablet 0  . insulin glargine (LANTUS) 100 UNIT/ML injection Inject 40 Units into the skin at bedtime.     . insulin lispro (HUMALOG) 100 UNIT/ML KiwkPen Inject 8 Units into the skin 3 (three) times daily with meals.     . Insulin Syringe-Needle U-100 (INSULIN SYRINGE .5CC/30GX5/16") 30G X 5/16" 0.5 ML MISC Inject 0.5 mLs as directed 3 (three) times daily.  3  . linaclotide (LINZESS) 145 MCG CAPS capsule Take 1 capsule (145 mcg total) by mouth daily before breakfast. Take 30 min before breakfast (Patient not taking: Reported on 01/13/2020) 30 capsule 3  . montelukast (SINGULAIR) 10 MG tablet Take 10 mg by mouth at bedtime.    . Multiple Vitamin (MULTIVITAMIN WITH MINERALS) TABS tablet Take 1 tablet by mouth daily.    . naloxone (NARCAN) nasal spray 4 mg/0.1 mL Place 1 spray into the nose daily as needed (accidental  overdose.). Use as directed.  (Patient not taking: Reported on 01/13/2020)    . nitroGLYCERIN (NITROSTAT) 0.4 MG SL tablet Place 1 tablet (0.4 mg total) under the tongue every 5 (five) minutes x 3 doses as needed for chest pain. 25 tablet  3  . polyethylene glycol (MIRALAX / GLYCOLAX) 17 g packet Take 17 g by mouth daily as needed for moderate constipation.    . primidone (MYSOLINE) 50 MG tablet Take 100 mg by mouth daily.   5  . Probiotic Product (PROBIOTIC DAILY PO) Take 1 capsule by mouth in the morning, at noon, and at bedtime.  (Patient not taking: Reported on 01/13/2020)    . psyllium (METAMUCIL) 58.6 % packet Take by mouth daily. (Patient not taking: Reported on 01/13/2020)    . rOPINIRole (REQUIP) 1 MG tablet Take 1 mg by mouth 3 (three) times daily as needed (muscle spasms).    . Tiotropium Bromide-Olodaterol (STIOLTO RESPIMAT) 2.5-2.5 MCG/ACT AERS Take 2 puffs by mouth daily. 4 g 11  . tiZANidine (ZANAFLEX) 2 MG tablet Take 1 tablet (2 mg total) by mouth every 6 (six) hours as needed. 60 tablet 0  . traMADol (ULTRAM) 50 MG tablet Take 100 mg by mouth every 8 (eight) hours as needed for moderate pain.      No current facility-administered medications for this visit.   Allergies:  Carvedilol, Codeine, and Metoprolol   Social History: The patient  reports that she has never smoked. She has never used smokeless tobacco. She reports that she does not drink alcohol and does not use drugs.   Family History: The patient's family history includes Alcoholism in her brother and brother; Arthritis in her brother; CAD in her paternal grandmother and paternal uncle; Cancer in her sister; Diabetes in her mother; Gout in her brother; Heart attack in her paternal grandfather and sister; Heart disease in her sister; Hypertension in her mother; Osteoporosis in her sister; Stroke in her father.   ROS:  Please see the history of present illness. Otherwise, complete review of systems is positive for none.  All  other systems are reviewed and negative.   Physical Exam: VS:  BP (!) 168/60   Pulse 74   Ht 5\' 5"  (1.651 m)   Wt 202 lb 12.8 oz (92 kg)   SpO2 97%   BMI 33.75 kg/m , BMI Body mass index is 33.75 kg/m.  Wt Readings from Last 3 Encounters:  03/03/20 202 lb 12.8 oz (92 kg)  02/04/20 201 lb 2 oz (91.2 kg)  01/22/20 201 lb 2 oz (91.2 kg)    General: Patient appears comfortable at rest. Neck: Supple, no elevated JVP or carotid bruits, no thyromegaly. Lungs: Clear to auscultation, nonlabored breathing at rest. Cardiac: Regular rate and rhythm, no S3 or significant systolic murmur, no pericardial rub. Extremities: No pitting edema, distal pulses 2+. Skin: Warm and dry. Musculoskeletal: No kyphosis. Neuropsychiatric: Alert and oriented x3, affect grossly appropriate.  ECG:  01/21/2020 EKG shows sinus rhythm, negative precordial T waves, probably normal, consider anteroseptal ischemia  Recent Labwork: 08/01/2019: ALT 16; AST 18 02/03/2020: BUN 46; Creatinine, Ser 2.40; Potassium 5.9; Sodium 135 02/05/2020: Hemoglobin 7.8; Platelets 136     Component Value Date/Time   CHOL 111 05/18/2018 0418   TRIG 116 05/18/2018 0418   HDL 41 05/18/2018 0418   CHOLHDL 2.7 05/18/2018 0418   VLDL 23 05/18/2018 0418   LDLCALC 47 05/18/2018 0418    Other Studies Reviewed Today:  Echocardiogram 05/19/2018: Study Conclusions  - Left ventricle: The cavity size was normal. Wall thickness was increased in a pattern of moderate LVH. Systolic function was normal. The estimated ejection fraction was in the range of 60% to 65%. Possible basal inferior hypokinesis. Doppler parameters are consistent with abnormal left ventricular  relaxation (grade 1 diastolic dysfunction). The E/e&' ratio is between 8-15, suggesting indeterminate LV filling pressure. - Mitral valve: Mildly thickened leaflets . There was mild regurgitation. - Left atrium: The atrium was mildly dilated. - Right atrium: The  atrium was mildly dilated. - Inferior vena cava: The vessel was normal in size. The respirophasic diameter changes were in the normal range (= 50%), consistent with normal central venous pressure.  Impressions:  - Compared to a prior study in 2018, the LVEF is lower at 60-65% with moderate LVH and mild biatrial enlargment. There is basal inferior hypokinesis.  Cardiac catheterization 05/20/2018:  Prox LAD to Mid LAD lesion is 100% stenosed.  Origin lesion is 100% stenosed.  Origin to Prox Graft lesion is 100% stenosed.  Origin to Prox Graft lesion is 100% stenosed.  Ramus lesion is 95% stenosed.  Prox Cx to Mid Cx lesion is 90% stenosed.  Prox LAD lesion is 80% stenosed.  Prox RCA lesion is 90% stenosed.  Mid RCA lesion is 99% stenosed.  Dist RCA lesion is 60% stenosed.  IMPRESSION:Ms. Snoke is now 4 years post coronary artery bypass grafting x4. All of her vein grafts are occluded at the aorta. LIMA is patent to LAD. She has high-grade calcified proximal mid and distal dominant RCA stenosis along with a high-grade mid ramus branch stenosis just proximal to the occluded vein graft. I used 100 cc of contrast and and decided to stage her intervention given her chronic renal insufficiency. I performed a femoral angiogram and Mynx closed her right common femoral puncture site. I did give her 40 mg of IV Lasix because of an elevated LVEDP of 19 as well as hydralazine IV for retention. She left the lab in stable condition.Marland Kitchen  PCI 05/22/2018:  Complex with orbital atherectomy followed by stenting of the proximal mid and distal RCA.  The mid and distal lesions were reduced from 99 and 70% stenosis to 0 and 0% stenosis using a 2.75 x 34 mm Onyx postdilated to 3.0 mm in diameter with TIMI grade III flow.  The proximal 85% RCA with TIMI grade III flow was reduced to 0% using a 22 x 3.5 mm Onyx postdilated to 3.75 mm in diameter with resultant TIMI grade III  flow.  RECOMMENDATIONS:   Aspirin and Plavix dual antiplatelet therapy for at least 30 days and preferably for 3 months at which point Plavix monotherapy can be used to complete a 47-month duration of therapy.   Assessment and Plan:  1. CAD in native artery   2. Mixed hyperlipidemia   3. Stage 3 chronic kidney disease, unspecified whether stage 3a or 3b CKD    1. CAD in native artery Patient had troponins drawn at skilled nursing facility where she was undergoing rehab recently.  Initial troponin less than 0.3.  Second troponin 0 0.38.  Patient states she denied any chest pain shortness of breath or any other anginal or exertional symptoms to prompt having troponins drawn.  Currently denies any anginal or exertional symptoms.  She is status post right total hip arthroplasty and was just released from the skilled nursing facility and undergoing physical therapy.  Continue aspirin 81 mg daily.  Plavix 75 mg daily.  Imdur 60 mg daily.  Sublingual nitroglycerin 0.4 mg as needed  2. Mixed hyperlipidemia Continue atorvastatin 40 mg daily.  Last known lipid panel 05/18/2018 showed total cholesterol 111, triglycerides 116, HDL 41, LDL 47.  3.  Stage IV chronic kidney disease. Recent creatinine of 2.26.  GFR  20.3.  Patient is following with nephrology  Medication Adjustments/Labs and Tests Ordered: Current medicines are reviewed at length with the patient today.  Concerns regarding medicines are outlined above.   Disposition: Follow-up with Dr. Domenic Polite or APP 3 months  Signed, Levell July, NP 03/03/2020 3:23 PM    [facility], [name], [place] [address] Phone: [phone]; Fax: [phone]

## 2020-03-13 ENCOUNTER — Other Ambulatory Visit: Payer: Self-pay | Admitting: Cardiology

## 2020-03-17 ENCOUNTER — Ambulatory Visit: Payer: Medicare Other | Admitting: Emergency Medicine

## 2020-03-17 ENCOUNTER — Other Ambulatory Visit: Payer: Self-pay

## 2020-03-17 ENCOUNTER — Encounter: Payer: Self-pay | Admitting: Emergency Medicine

## 2020-03-17 DIAGNOSIS — J449 Chronic obstructive pulmonary disease, unspecified: Secondary | ICD-10-CM

## 2020-03-17 NOTE — Progress Notes (Signed)
Subjective:    Patient ID: Joyce [name], female    DOB: 1940/08/12, 79 y.o.   MRN: [id number]   Shortness of Breath Pertinent negatives include no ear pain, fever, headaches, leg swelling, rash, rhinorrhea, sore throat, vomiting or wheezing.   ROV 08/16/2018 --Joyce [name] is a pleasant 79 year old woman who has a history of mixed restrictive and obstructive lung disease.  She underwent bilateral talc pleurodesis following persistent effusions following CABG in 09/2016.  We have been managing her on Stiolto.  She follows with cardiology, was admitted in November 2019 with chest discomfort, catheterization revealed patent LIMA to LAD but her vein grafts were occluded. She underwent stenting.  She reports today that she is feeling better although remains tired. She notes that she is benefiting from the [place] - she ran out last week. Has albuterol that she never needs it. No cough or wheeze. She has had the flu shot. Her PNA vaccine is up to date.   ROV 01/07/2019 --79 year old woman with a history of coronary disease/CABG, talc pleurodesis bilaterally for persistent associated effusions, COPD / fixed asthma which has been managed on Stiolto. She has albuterol, uses rarely when she exerts. No flares since last time - no abx or pred. She has occasional fatigue, has been noted to have anemia, planning to see Heme.  Exercise tolerance is moderate. She doesn't do much in the heat. She can do housework. She is able to shop for groceries.   ROV 03/17/20 --this follow-up visit for 79 year old woman with a history of CAD/CABG, type releases bilaterally for effusions, COPD/risks asthma.  She has mixed obstructive and restrictive disease.  Has been managed on Stiolto.  Last seen 12/2018.  She reports that she had a hip replacement, went to SNF for rehab after. Unfortunately they didn't continue her Stiolto - she restarted it when she got home about 3 weeks ago. She has albuterol that seems to help. She is  interested in using nebs to enhance delivery.   Review of Systems  Constitutional: Negative for fever and unexpected weight change.  HENT: Negative for congestion, dental problem, ear pain, nosebleeds, postnasal drip, rhinorrhea, sinus pressure, sneezing, sore throat and trouble swallowing.   Eyes: Negative for redness and itching.  Respiratory: Positive for shortness of breath. Negative for cough, chest tightness and wheezing.   Cardiovascular: Negative for palpitations and leg swelling.  Gastrointestinal: Negative for nausea and vomiting.  Genitourinary: Negative for dysuria.  Musculoskeletal: Negative for joint swelling.  Skin: Negative for rash.  Neurological: Negative for headaches.  Hematological: Does not bruise/bleed easily.  Psychiatric/Behavioral: Negative for dysphoric mood. The patient is not nervous/anxious.        Objective:   Physical Exam Vitals:   03/17/20 1443  BP: 140/72  Pulse: 83  Temp: 98.7 F (37.1 C)  TempSrc: Temporal  SpO2: 97%  Weight: 201 lb 12.8 oz (91.5 kg)  Height: 5\' 5"  (1.651 m)   Gen: Pleasant, well-nourished, in no distress,  normal affect  ENT: No lesions,  mouth clear,  oropharynx clear, no postnasal drip  Neck: No JVD, no Stridor  Lungs: No use of accessory muscles, Good air movement, no wheezing or crackles, no basilar dullness  Cardiovascular: RRR, 3/6 syst M w intact S2  Musculoskeletal: No deformities, no cyanosis or clubbing  Neuro: alert, non focal  Skin: Warm, no lesions or rashes      Assessment & Plan:  COPD with asthma ([place]) She was unfortunately off Stiolto for period of time when she  was recovering from her hip surgery.  This led to deconditioning and now even though she is back on the Stiolto she still has not gotten back to her usual baseline.  We talked some about getting her a nebulizer, and then she is going to try to continue her HFA with a spacer.  Please continue Stiolto 2 puffs once daily.  Take this every  day.  Your maintenance medicine. Use albuterol 2 puffs when you need it for shortness of breath, chest tightness, wheezing.  Try using this with a spacer to see if you get better delivery.  Also try taking it about 10 to 15 minutes before you do your physical therapy Follow with Dr Lamonte Sakai in 3 months or sooner if you have any problems  Baltazar Apo, MD, PhD 03/17/2020, 3:02 PM [facility] [phone] or if no answer [phone]

## 2020-03-17 NOTE — Assessment & Plan Note (Signed)
She was unfortunately off Stiolto for period of time when she was recovering from her hip surgery.  This led to deconditioning and now even though she is back on the Stiolto she still has not gotten back to her usual baseline.  We talked some about getting her a nebulizer, and then she is going to try to continue her HFA with a spacer.  Please continue Stiolto 2 puffs once daily.  Take this every day.  Your maintenance medicine. Use albuterol 2 puffs when you need it for shortness of breath, chest tightness, wheezing.  Try using this with a spacer to see if you get better delivery.  Also try taking it about 10 to 15 minutes before you do your physical therapy Follow with Dr Lamonte Sakai in 3 months or sooner if you have any problems

## 2020-03-17 NOTE — Patient Instructions (Signed)
Please continue Stiolto 2 puffs once daily.  Take this every day.  Your maintenance medicine. Use albuterol 2 puffs when you need it for shortness of breath, chest tightness, wheezing.  Try using this with a spacer to see if you get better delivery.  Also try taking it about 10 to 15 minutes before you do your physical therapy Follow with Dr Lamonte Sakai in 3 months or sooner if you have any problems.

## 2020-04-02 ENCOUNTER — Ambulatory Visit: Payer: Medicare Other | Admitting: Cardiology

## 2020-04-06 ENCOUNTER — Other Ambulatory Visit (HOSPITAL_COMMUNITY): Payer: Self-pay | Admitting: Surgery

## 2020-04-06 DIAGNOSIS — D649 Anemia, unspecified: Secondary | ICD-10-CM

## 2020-04-07 ENCOUNTER — Inpatient Hospital Stay (HOSPITAL_COMMUNITY): Payer: Medicare Other

## 2020-04-09 ENCOUNTER — Telehealth (HOSPITAL_COMMUNITY): Payer: Medicare Other | Admitting: Oncology

## 2020-04-12 ENCOUNTER — Ambulatory Visit: Payer: Medicare Other | Admitting: Emergency Medicine

## 2020-04-14 ENCOUNTER — Other Ambulatory Visit: Payer: Self-pay

## 2020-04-14 ENCOUNTER — Ambulatory Visit: Payer: Medicare Other | Admitting: Emergency Medicine

## 2020-04-14 ENCOUNTER — Encounter: Payer: Self-pay | Admitting: Emergency Medicine

## 2020-04-14 DIAGNOSIS — I5032 Chronic diastolic (congestive) heart failure: Secondary | ICD-10-CM | POA: Diagnosis not present

## 2020-04-14 DIAGNOSIS — J449 Chronic obstructive pulmonary disease, unspecified: Secondary | ICD-10-CM | POA: Diagnosis not present

## 2020-04-14 MED ORDER — ALBUTEROL SULFATE (2.5 MG/3ML) 0.083% IN NEBU: 2.5000 mg | INHALATION_SOLUTION | Freq: Four times a day (QID) | RESPIRATORY_TRACT | Status: DC

## 2020-04-14 NOTE — Progress Notes (Signed)
Subjective:    Patient ID: Joyce [name], female    DOB: Oct 20, 1940, 79 y.o.   MRN: [id number]   Shortness of Breath Pertinent negatives include no ear pain, fever, headaches, leg swelling, rash, rhinorrhea, sore throat, vomiting or wheezing.   ROV 08/16/2018 --Joyce [name] is a pleasant 79 year old woman who has a history of mixed restrictive and obstructive lung disease.  She underwent bilateral talc pleurodesis following persistent effusions following CABG in 09/2016.  We have been managing her on Stiolto.  She follows with cardiology, was admitted in November 2019 with chest discomfort, catheterization revealed patent LIMA to LAD but her vein grafts were occluded. She underwent stenting.  She reports today that she is feeling better although remains tired. She notes that she is benefiting from the [place] - she ran out last week. Has albuterol that she never needs it. No cough or wheeze. She has had the flu shot. Her PNA vaccine is up to date.   ROV 01/07/2019 --79 year old woman with a history of coronary disease/CABG, talc pleurodesis bilaterally for persistent associated effusions, COPD / fixed asthma which has been managed on Stiolto. She has albuterol, uses rarely when she exerts. No flares since last time - no abx or pred. She has occasional fatigue, has been noted to have anemia, planning to see Heme.  Exercise tolerance is moderate. She doesn't do much in the heat. She can do housework. She is able to shop for groceries.   ROV 03/17/20 --this follow-up visit for 79 year old woman with a history of CAD/CABG, type releases bilaterally for effusions, COPD/risks asthma.  She has mixed obstructive and restrictive disease.  Has been managed on Stiolto.  Last seen 12/2018.  She reports that she had a hip replacement, went to SNF for rehab after. Unfortunately they didn't continue her Stiolto - she restarted it when she got home about 3 weeks ago. She has albuterol that seems to help. She is  interested in using nebs to enhance delivery.   ROV 04/14/20 --79 year old woman with CAD/CABG, hypertension with diastolic CHF, COPD with asthma and mixed obstruction and restriction on spirometry.  At her last visit she was experiencing more dyspnea.  She was back on her Stiolto after having this stopped briefly postop hip surgery.  She reports today that she was hospitalized again since last time, at [facility], for acute on chronic CHF. Remains on Stiolto. She was diuresed effectively, improved. Now she is having progressive dyspnea and increase LE edema. Minimal cough. She is about to start more PT for her stamina.  COVID vaccine and flu shot up to date.    Review of Systems  Constitutional: Negative for fever and unexpected weight change.  HENT: Negative for congestion, dental problem, ear pain, nosebleeds, postnasal drip, rhinorrhea, sinus pressure, sneezing, sore throat and trouble swallowing.   Eyes: Negative for redness and itching.  Respiratory: Positive for shortness of breath. Negative for cough, chest tightness and wheezing.   Cardiovascular: Negative for palpitations and leg swelling.  Gastrointestinal: Negative for nausea and vomiting.  Genitourinary: Negative for dysuria.  Musculoskeletal: Negative for joint swelling.  Skin: Negative for rash.  Neurological: Negative for headaches.  Hematological: Does not bruise/bleed easily.  Psychiatric/Behavioral: Negative for dysphoric mood. The patient is not nervous/anxious.        Objective:   Physical Exam Vitals:   04/14/20 1454  BP: (!) 154/72  Pulse: 85  Temp: 98.2 F (36.8 C)  TempSrc: Temporal  SpO2: 97%  Weight: 198 lb 6.4 oz (90  kg)  Height: 5\' 5"  (1.651 m)   Gen: Pleasant, well-nourished, in no distress,  normal affect  ENT: No lesions,  mouth clear,  oropharynx clear, no postnasal drip  Neck: No JVD, no Stridor  Lungs: No use of accessory muscles, Good air movement, few bibasilar insp crackles.   Cardiovascular:  RRR, 3/6 syst M w intact S2  Musculoskeletal: No deformities, no cyanosis or clubbing  Neuro: alert, non focal  Skin: Warm, no lesions or rashes      Assessment & Plan:  COPD with asthma ([place]) She has had more dyspnea, more ups and downs, over the last several months.  She is now on her stable dose of Stiolto.  No wheezing.  I think that this episode and her recent hospitalization were principally due to labile diastolic heart failure.  She was better with diuresis.  She felt like she got more benefit from nebulized albuterol while she was in the hospital so we will write a prescription for this.  It may be that this delivery system is more effective than her HFA.  Please continue Stiolto 2 puffs once daily. Use albuterol either 2 puffs or 1 nebulizer treatment up to every 4 hours if needed for shortness of breath, chest tightness, wheezing.  We will write a prescription for a nebulizer machine and nebulizer treatments today. Agree with starting more physical therapy next week as planned.ell. COVID-19 vaccine and flu shot are both up-to-date Follow with Dr Lamonte Sakai in 6 months or sooner if you have any problems  Diastolic CHF, chronic Continue your diuretics and cardiac medications as directed by Dr. Jenean Lindau and by Dr. Kennieth Francois, MD, PhD 04/14/2020, 3:16 PM [facility] [phone] or if no answer [phone]

## 2020-04-14 NOTE — Patient Instructions (Signed)
Please continue Stiolto 2 puffs once daily. Use albuterol either 2 puffs or 1 nebulizer treatment up to every 4 hours if needed for shortness of breath, chest tightness, wheezing.  We will write a prescription for a nebulizer machine and nebulizer treatments today. Agree with starting more physical therapy next week as planned. Continue your diuretics and cardiac medications as directed by Dr. Jenean Lindau and by Dr. Domenic Polite. COVID-19 vaccine and flu shot are both up-to-date Follow with Dr Lamonte Sakai in 6 months or sooner if you have any problems

## 2020-04-14 NOTE — Assessment & Plan Note (Signed)
She has had more dyspnea, more ups and downs, over the last several months.  She is now on her stable dose of Stiolto.  No wheezing.  I think that this episode and her recent hospitalization were principally due to labile diastolic heart failure.  She was better with diuresis.  She felt like she got more benefit from nebulized albuterol while she was in the hospital so we will write a prescription for this.  It may be that this delivery system is more effective than her HFA.  Please continue Stiolto 2 puffs once daily. Use albuterol either 2 puffs or 1 nebulizer treatment up to every 4 hours if needed for shortness of breath, chest tightness, wheezing.  We will write a prescription for a nebulizer machine and nebulizer treatments today. Agree with starting more physical therapy next week as planned.ell. COVID-19 vaccine and flu shot are both up-to-date Follow with Dr Lamonte Sakai in 6 months or sooner if you have any problems

## 2020-04-14 NOTE — Assessment & Plan Note (Signed)
Continue your diuretics and cardiac medications as directed by Dr. Jenean Lindau and by Dr. Domenic Polite

## 2020-04-14 NOTE — Addendum Note (Signed)
Addended by: Gavin Potters R on: 04/14/2020 04:43 PM   Modules accepted: Orders

## 2020-04-15 ENCOUNTER — Other Ambulatory Visit: Payer: Self-pay | Admitting: *Deleted

## 2020-04-15 ENCOUNTER — Telehealth: Payer: Self-pay | Admitting: Cardiology

## 2020-04-15 NOTE — Telephone Encounter (Signed)
Thank you for the update.  Looks like she saw her PCP yesterday, I reviewed the note, she was doing better overall.  Please schedule her to be seen by [place] in the [place] office, would not wait until December in case we need to make any further medication adjustments.  Remind her to weigh herself daily.

## 2020-04-15 NOTE — Telephone Encounter (Signed)
Some notes in care everywhere will forward to provider if appt needs to be moved sooner than December

## 2020-04-15 NOTE — Telephone Encounter (Signed)
Patient called stating that she was discharged from [facility] 04/05/2020 with heart failure. She is wanting to know if she needs to be seen sooner than the upcoming appointment with Dr. Domenic Polite in December. Unable to see discharge notes in [place].

## 2020-04-16 NOTE — Telephone Encounter (Signed)
Pt voiced understanding scheduled for 11/9 with PA [place]

## 2020-05-04 ENCOUNTER — Other Ambulatory Visit: Payer: Self-pay

## 2020-05-04 ENCOUNTER — Other Ambulatory Visit (HOSPITAL_COMMUNITY)
Admission: RE | Admit: 2020-05-04 | Discharge: 2020-05-04 | Disposition: A | Discharge status: Home / Self Care | Payer: Medicare Other | Source: Ambulatory Visit | Attending: Cardiology | Admitting: Cardiology

## 2020-05-04 ENCOUNTER — Encounter: Payer: Self-pay | Admitting: Cardiology

## 2020-05-04 ENCOUNTER — Telehealth: Payer: Self-pay | Admitting: *Deleted

## 2020-05-04 ENCOUNTER — Ambulatory Visit: Payer: Medicare Other | Admitting: Cardiology

## 2020-05-04 VITALS — BP 180/80 | HR 70 | Ht 65.0 in | Wt 193.0 lb

## 2020-05-04 DIAGNOSIS — N1832 Chronic kidney disease, stage 3b: Secondary | ICD-10-CM

## 2020-05-04 DIAGNOSIS — I10 Essential (primary) hypertension: Secondary | ICD-10-CM

## 2020-05-04 DIAGNOSIS — R0602 Shortness of breath: Secondary | ICD-10-CM | POA: Diagnosis not present

## 2020-05-04 DIAGNOSIS — R609 Edema, unspecified: Secondary | ICD-10-CM

## 2020-05-04 DIAGNOSIS — Z951 Presence of aortocoronary bypass graft: Secondary | ICD-10-CM

## 2020-05-04 DIAGNOSIS — E1169 Type 2 diabetes mellitus with other specified complication: Secondary | ICD-10-CM

## 2020-05-04 DIAGNOSIS — I2510 Atherosclerotic heart disease of native coronary artery without angina pectoris: Secondary | ICD-10-CM | POA: Insufficient documentation

## 2020-05-04 DIAGNOSIS — I5031 Acute diastolic (congestive) heart failure: Secondary | ICD-10-CM

## 2020-05-04 DIAGNOSIS — M797 Fibromyalgia: Secondary | ICD-10-CM

## 2020-05-04 DIAGNOSIS — E1122 Type 2 diabetes mellitus with diabetic chronic kidney disease: Secondary | ICD-10-CM

## 2020-05-04 DIAGNOSIS — D649 Anemia, unspecified: Secondary | ICD-10-CM

## 2020-05-04 DIAGNOSIS — E669 Obesity, unspecified: Secondary | ICD-10-CM

## 2020-05-04 DIAGNOSIS — Z9861 Coronary angioplasty status: Secondary | ICD-10-CM

## 2020-05-04 DIAGNOSIS — N184 Chronic kidney disease, stage 4 (severe): Secondary | ICD-10-CM

## 2020-05-04 DIAGNOSIS — R079 Chest pain, unspecified: Secondary | ICD-10-CM

## 2020-05-04 DIAGNOSIS — Z79899 Other long term (current) drug therapy: Secondary | ICD-10-CM

## 2020-05-04 LAB — BASIC METABOLIC PANEL
Anion gap: 10 (ref 5–15)
BUN: 35 mg/dL — ABNORMAL HIGH (ref 8–23)
CO2: 23 mmol/L (ref 22–32)
Calcium: 8.6 mg/dL — ABNORMAL LOW (ref 8.9–10.3)
Chloride: 105 mmol/L (ref 98–111)
Creatinine, Ser: 2.37 mg/dL — ABNORMAL HIGH (ref 0.44–1.00)
GFR, Estimated: 21 mL/min — ABNORMAL LOW (ref >60)
Glucose, Bld: 75 mg/dL (ref 70–99)
Potassium: 4.5 mmol/L (ref 3.5–5.1)
Sodium: 138 mmol/L (ref 135–145)

## 2020-05-04 LAB — D-DIMER, QUANTITATIVE: D-Dimer, Quant: 1.89 ug/mL-FEU — ABNORMAL HIGH (ref 0.00–0.50)

## 2020-05-04 LAB — CBC
HCT: 31.7 % — ABNORMAL LOW (ref 36.0–46.0)
Hemoglobin: 10.1 g/dL — ABNORMAL LOW (ref 12.0–15.0)
MCH: 28.2 pg (ref 26.0–34.0)
MCHC: 31.9 g/dL (ref 30.0–36.0)
MCV: 88.5 fL (ref 80.0–100.0)
Platelets: 182 10*3/uL (ref 150–400)
RBC: 3.58 MIL/uL — ABNORMAL LOW (ref 3.87–5.11)
RDW: 13.6 % (ref 11.5–15.5)
WBC: 5.7 10*3/uL (ref 4.0–10.5)
nRBC: 0 % (ref 0.0–0.2)

## 2020-05-04 LAB — BRAIN NATRIURETIC PEPTIDE: B Natriuretic Peptide: 554 pg/mL — ABNORMAL HIGH (ref 0.0–100.0)

## 2020-05-04 MED ORDER — METOLAZONE 5 MG PO TABS: 5.0000 mg | ORAL_TABLET | Freq: Every day | ORAL | 0 refills | Status: DC

## 2020-05-04 MED ORDER — FUROSEMIDE 80 MG PO TABS: 80.0000 mg | ORAL_TABLET | Freq: Every day | ORAL | 3 refills | Status: DC

## 2020-05-04 NOTE — Assessment & Plan Note (Signed)
CABG 2015- pleurodesis post op

## 2020-05-04 NOTE — Assessment & Plan Note (Signed)
IDDM with neuropathy, nephropathy

## 2020-05-04 NOTE — Assessment & Plan Note (Signed)
Followed in [place]

## 2020-05-04 NOTE — Patient Instructions (Signed)
Medication Instructions:  Your physician recommends that you continue on your current medications as directed. Please refer to the Current Medication list given to you today.  *If you need a refill on your cardiac medications before your next appointment, please call your pharmacy*   Lab Work: Your physician recommends that you return for lab work in: Today   If you have labs (blood work) drawn today and your tests are completely normal, you will receive your results only by:  MyChart Message (if you have MyChart) OR  A paper copy in the mail If you have any lab test that is abnormal or we need to change your treatment, we will call you to review the results.   Testing/Procedures: Your physician has requested that you have an echocardiogram. Echocardiography is a painless test that uses sound waves to create images of your heart. It provides your doctor with information about the size and shape of your heart and how well your hearts chambers and valves are working. This procedure takes approximately one hour. There are no restrictions for this procedure.     Follow-Up: At [facility], you and your health needs are our priority.  As part of our continuing mission to provide you with exceptional heart care, we have created designated Provider Care Teams.  These Care Teams include your primary Cardiologist (physician) and Advanced Practice Providers (APPs -  Physician Assistants and Nurse Practitioners) who all work together to provide you with the care you need, when you need it.  We recommend signing up for the patient portal called "MyChart".  Sign up information is provided on this After Visit Summary.  MyChart is used to connect with patients for Virtual Visits (Telemedicine).  Patients are able to view lab/test results, encounter notes, upcoming appointments, etc.  Non-urgent messages can be sent to your provider as well.   To learn more about what you can do with MyChart, go to  [email or web address].    Your next appointment:   3 week(s)  The format for your next appointment:   In Person  Provider:   You will see one of the following Advanced Practice Providers on your designated Care Team:    Bernerd Pho, PA-C   Ermalinda Barrios, PA-C   Other Instructions Thank you for choosing [facility]!

## 2020-05-04 NOTE — Progress Notes (Signed)
Cardiology Office Note:    Date:  05/04/2020   ID:  MICKELLE GOUPIL, DOB 1941/06/14, MRN [id number]  PCP:  Allie Dimmer, MD  Cardiologist:  Rozann Lesches, MD  Electrophysiologist:  None   Referring MD: Allie Dimmer, MD   CC: dyspnea  History of Present Illness:    Joyce [name] is a pleasant 79 y.o. female from [place] with a complicated medical history.  The patient had CABG In 2015 with an LIMA to LAD, SVG to diagonal, SVG to OM and, SVG to PDA.  She reportedly had chronic pleural effusion postoperatively and required talc pleurodesis.  In 2019 she underwent diagnostic catheterization which revealed revealed occlusion of all her vein grafts.  The LIMA to LAD was patent.  She underwent RCA PCI with DES x 3. Echocardiogram in 2019 showed preserved LV function with an EF of 60% with moderate LVH.  Other medical issues include insulin-dependent diabetes with neuropathy, fibromyalgia and chronic pain (off Dilaudid and OxyContin-on tramadol), chronic renal insufficiency stage III-IV followed in [place], essential hypertension, chronic anemia, asthmatic COPD followed by Dr. Lamonte Sakai, and polypharmacy.  Patient had hip surgery in August 2021.  Since then she has had problems with shortness of breath and lower extremity edema.  She was evaluated for DVT and this was negative.  She was recently admitted to [facility] with acute diastolic heart failure.  No records of that admission are available but the patient says she was only kept overnight.  Her amlodipine was cut back to 5 mg a day thinking this may help her edema.  She does not think she had an echocardiogram done.  She is in the office today for follow-up.  She continues to complain of lower extremity edema and shortness of breath.  She also says that she cannot lay flat since her hip surgery.  In reviewing her weights they have actually come down from her baseline.  Past Medical History:  Diagnosis Date  . Anemia   .  Arthritis   . Asthma   . Bilateral pleural effusion    Postoperative, status post Pleurx catheter and talc treatment - Dr. Prescott Gum  . 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 Chronic kidney disease (CKD), stage III (moderate) (HCC)   . Chronic lower back pain   . COPD (chronic obstructive pulmonary disease) ([place])    pt. denies  . Essential hypertension   . Family history of adverse reaction to anesthesia    Mom was slow to wake up  . Fibromyalgia    neuropathy feet and fingers  . History of pneumonia   . Hyperlipidemia   . Myocardial infarction ([place])   . Type II diabetes mellitus ([place])     Past Surgical History:  Procedure Laterality Date  . ABDOMINAL HYSTERECTOMY  1980's  . ANTERIOR CERVICAL DECOMP/DISCECTOMY FUSION  2000's  . APPENDECTOMY  1970's  . BACK SURGERY    . BIOPSY  08/28/2019   Procedure: BIOPSY;  Surgeon: Rogene Houston, MD;  [facility];  Service: Endoscopy;;  duodenal  . CHEST TUBE INSERTION Left 08/07/2014   Procedure: INSERTION PLEURAL DRAINAGE CATHETER;  Surgeon: Ivin Poot, MD;  Location: [place];  Service: Thoracic;  Laterality: Left;  . CHEST TUBE INSERTION Right 08/12/2014   Procedure: INSERTION PLEURAL DRAINAGE CATHETER;  Surgeon: Ivin Poot, MD;  Location: [place];  Service: Thoracic;  Laterality: Right;  . CHOLECYSTECTOMY  ~ 2005  . COLONOSCOPY N/A 08/28/2019   Procedure: COLONOSCOPY;  Surgeon: Rogene Houston, MD;  [facility];  Service: Endoscopy;  Laterality: N/A;  100  . CORONARY ARTERY BYPASS GRAFT N/A 06/15/2014   Procedure: CORONARY ARTERY BYPASS GRAFTING (CABG);  Surgeon: Ivin Poot, MD;  Location: [place];  Service: Open Heart Surgery;  Laterality: N/A;  . CORONARY ATHERECTOMY N/A 05/22/2018   Procedure: CORONARY ATHERECTOMY;  Surgeon: Belva Crome, MD;  Location: [place] [facility];  Service: Cardiovascular;  Laterality:  N/A;  . CORONARY STENT INTERVENTION N/A 05/22/2018   Procedure: CORONARY STENT INTERVENTION;  Surgeon: Belva Crome, MD;  Location: [place] CV [facility];  Service: Cardiovascular;  Laterality: N/A;  rca   . ESOPHAGOGASTRODUODENOSCOPY N/A 08/28/2019   Procedure: ESOPHAGOGASTRODUODENOSCOPY (EGD);  Surgeon: Rogene Houston, MD;  [facility];  Service: Endoscopy;  Laterality: N/A;  . INTRAOPERATIVE TRANSESOPHAGEAL ECHOCARDIOGRAM N/A 06/15/2014   Procedure: INTRAOPERATIVE TRANSESOPHAGEAL ECHOCARDIOGRAM;  Surgeon: Ivin Poot, MD;  Location: [place];  Service: Open Heart Surgery;  Laterality: N/A;  . LEFT HEART CATH AND CORS/GRAFTS ANGIOGRAPHY N/A 05/20/2018   Procedure: LEFT HEART CATH AND CORS/GRAFTS ANGIOGRAPHY;  Surgeon: Lorretta Harp, MD;  Location: [place] [facility];  Service: Cardiovascular;  Laterality: N/A;  . LEFT HEART CATHETERIZATION WITH CORONARY ANGIOGRAM N/A 06/11/2014   Procedure: LEFT HEART CATHETERIZATION WITH CORONARY ANGIOGRAM;  Surgeon: Blane Ohara, MD;  Location: [facility] C[facility];  Service: Cardiovascular;  Laterality: N/A;  . LUMBAR DISC SURGERY  1980's X 1; 1990's X  2000's X 1  . POLYPECTOMY  08/28/2019   Procedure: POLYPECTOMY;  Surgeon: Rogene Houston, MD;  [facility];  Service: Endoscopy;;  . REMOVAL OF PLEURAL DRAINAGE CATHETER Bilateral 02/11/2015   Procedure: REMOVAL OF PLEURAL DRAINAGE CATHETER;  Surgeon: Ivin Poot, MD;  Location: [place];  Service: Thoracic;  Laterality: Bilateral;  . TALC PLEURODESIS Bilateral 02/11/2015   Procedure: Pietro Cassis;  Surgeon: Ivin Poot, MD;  Location: [place];  Service: Thoracic;  Laterality: Bilateral;  . TONSILLECTOMY  ~ 1950  . TOTAL HIP ARTHROPLASTY Right 02/02/2020   Procedure: RIGHT TOTAL HIP ARTHROPLASTY ANTERIOR APPROACH;  Surgeon: Frederik Pear, MD;  Location[facility];  Service: Orthopedics;  Laterality: Right;    Current Medications: Current Meds  Medication Sig  . ACCU-CHEK AVIVA  PLUS test strip Inject 1 strip as directed 4 (four) times daily.  Marland Kitchen albuterol (VENTOLIN HFA) 108 (90 Base) MCG/ACT inhaler Inhale 2 puffs into the lungs every 4 (four) hours as needed for wheezing or shortness of breath.  Marland Kitchen amLODipine (NORVASC) 5 MG tablet Take 5 mg by mouth daily.  Marland Kitchen atorvastatin (LIPITOR) 40 MG tablet Take 40 mg by mouth every evening.   . busPIRone (BUSPAR) 5 MG tablet Take 5 mg by mouth daily as needed (anxiety).  . calcitRIOL (ROCALTROL) 0.25 MCG capsule Take 0.25 mcg by mouth every Monday, Wednesday, and Friday.  . Cholecalciferol (VITAMIN D3) 2000 units TABS Take 4,000-6,000 Units by mouth See admin instructions. Take 4000 units by mouth every other day alternating with 6000 units on alternate days  . clopidogrel (PLAVIX) 75 MG tablet TAKE ONE TABLET BY MOUTH DAILY WITH BREAKFAST  . FEROSUL 325 (65 Fe) MG tablet TAKE ONE TABLET BY MOUTH DAILY (Patient taking differently: Take 325 mg by mouth every Monday, Wednesday, and Friday. )  . fluticasone (FLONASE) 50 MCG/ACT nasal  spray Place 1 spray into both nostrils daily.   . furosemide (LASIX) 40 MG tablet Take 40 mg by mouth daily.  . hydrALAZINE (APRESOLINE) 100 MG tablet Take 100 mg by mouth 3 (three) times daily.  Marland Kitchen HYDROmorphone (DILAUDID) 2 MG tablet Take 1 tablet (2 mg total) by mouth every 4 (four) hours as needed for severe pain.  Marland Kitchen insulin glargine (LANTUS) 100 UNIT/ML injection Inject 40 Units into the skin at bedtime.   . insulin lispro (HUMALOG) 100 UNIT/ML KiwkPen Inject 8 Units into the skin 3 (three) times daily with meals.   . Insulin Syringe-Needle U-100 (INSULIN SYRINGE .5CC/30GX5/16") 30G X 5/16" 0.5 ML MISC Inject 0.5 mLs as directed 3 (three) times daily.  . isosorbide mononitrate (IMDUR) 60 MG 24 hr tablet TAKE ONE TABLET BY MOUTH DAILY (Patient taking differently: Take 60 mg by mouth daily. )  . linaclotide (LINZESS) 145 MCG CAPS capsule Take 1 capsule (145 mcg total) by mouth daily before breakfast. Take  30 min before breakfast  . lisinopril (PRINIVIL,ZESTRIL) 5 MG tablet Take 5 mg by mouth every evening.   . montelukast (SINGULAIR) 10 MG tablet Take 10 mg by mouth at bedtime.  . Multiple Vitamin (MULTIVITAMIN WITH MINERALS) TABS tablet Take 1 tablet by mouth daily.  . naloxone (NARCAN) nasal spray 4 mg/0.1 mL Place 1 spray into the nose daily as needed (accidental overdose.). Use as directed.   . nitroGLYCERIN (NITROSTAT) 0.4 MG SL tablet Place 1 tablet (0.4 mg total) under the tongue every 5 (five) minutes x 3 doses as needed for chest pain.  . polyethylene glycol (MIRALAX / GLYCOLAX) 17 g packet Take 17 g by mouth daily as needed for moderate constipation.  . primidone (MYSOLINE) 50 MG tablet Take 100 mg by mouth daily.   . Probiotic Product (PROBIOTIC DAILY PO) Take 1 capsule by mouth in the morning, at noon, and at bedtime.   . psyllium (METAMUCIL) 58.6 % packet Take by mouth daily.   Marland Kitchen rOPINIRole (REQUIP) 1 MG tablet Take 1 mg by mouth 3 (three) times daily as needed (muscle spasms).  . Tiotropium Bromide-Olodaterol (STIOLTO RESPIMAT) 2.5-2.5 MCG/ACT AERS Take 2 puffs by mouth daily.  Marland Kitchen tiZANidine (ZANAFLEX) 2 MG tablet Take 1 tablet (2 mg total) by mouth every 6 (six) hours as needed.  . traMADol (ULTRAM) 50 MG tablet Take 100 mg by mouth every 8 (eight) hours as needed for moderate pain.    Current Facility-Administered Medications for the 05/04/20 encounter (Office Visit) with Erlene Quan, PA-C  Medication  . albuterol (PROVENTIL) (2.5 MG/3ML) 0.083% nebulizer solution 2.5 mg     Allergies:   Carvedilol, Codeine, and Metoprolol   Social History   Socioeconomic History  . Marital status: Widowed    Spouse name: Not on file  . Number of children: 2  . Years of education: Not on file  . Highest education level: Not on file  Occupational History  . Occupation: retired  Tobacco Use  . Smoking status: Never Smoker  . Smokeless tobacco: Never Used  Vaping Use  . Vaping Use:  Never used  Substance and Sexual Activity  . Alcohol use: No    Alcohol/week: 0.0 standard drinks  . Drug use: No  . Sexual activity: Not on file  Other Topics Concern  . Not on file  Social History Narrative  . Not on file   Social Determinants of Health   Financial Resource Strain:   . Difficulty of Paying Living Expenses: Not on  file  Food Insecurity:   . Worried About Charity fundraiser in the Last Year: Not on file  . Ran Out of Food in the Last Year: Not on file  Transportation Needs:   . Lack of Transportation (Medical): Not on file  . Lack of Transportation (Non-Medical): Not on file  Physical Activity:   . Days of Exercise per Week: Not on file  . Minutes of Exercise per Session: Not on file  Stress:   . Feeling of Stress : Not on file  Social Connections:   . Frequency of Communication with Friends and Family: Not on file  . Frequency of Social Gatherings with Friends and Family: Not on file  . Attends Religious Services: Not on file  . Active Member of Clubs or Organizations: Not on file  . Attends Archivist Meetings: Not on file  . Marital Status: Not on file     Family History: The patient's family history includes Alcoholism in her brother and brother; Arthritis in her brother; CAD in her paternal grandmother and paternal uncle; Cancer in her sister; Diabetes in her mother; Gout in her brother; Heart attack in her paternal grandfather and sister; Heart disease in her sister; Hypertension in her mother; Osteoporosis in her sister; Stroke in her father.  ROS:   Please see the history of present illness.     All other systems reviewed and are negative.  EKGs/Labs/Other Studies Reviewed:    The following studies were reviewed today:  Echo 05/19/2020- Study Conclusions   - Left ventricle: The cavity size was normal. Wall thickness was  increased in a pattern of moderate LVH. Systolic function was  normal. The estimated ejection fraction was  in the range of 60%  to 65%. Possible basal inferior hypokinesis. Doppler parameters  are consistent with abnormal left ventricular relaxation (grade 1  diastolic dysfunction). The E/e&' ratio is between 8-15,  suggesting indeterminate LV filling pressure.  - Mitral valve: Mildly thickened leaflets . There was mild  regurgitation.  - Left atrium: The atrium was mildly dilated.  - Right atrium: The atrium was mildly dilated.  - Inferior vena cava: The vessel was normal in size. The  respirophasic diameter changes were in the normal range (= 50%),  consistent with normal central venous pressure.  EKG:  EKG is ordered today.  The ekg ordered today demonstrates NSR, HR 74, PACs  Recent Labs: 08/01/2019: ALT 16 02/03/2020: BUN 46; Creatinine, Ser 2.40; Potassium 5.9; Sodium 135 02/05/2020: Hemoglobin 7.8; Platelets 136  Recent Lipid Panel    Component Value Date/Time   CHOL 111 05/18/2018 0418   TRIG 116 05/18/2018 0418   HDL 41 05/18/2018 0418   CHOLHDL 2.7 05/18/2018 0418   VLDL 23 05/18/2018 0418   LDLCALC 47 05/18/2018 0418    Physical Exam:    VS:  BP (!) 180/80   Pulse 70   Ht 5\' 5"  (1.651 m)   Wt 193 lb (87.5 kg)   SpO2 97%   BMI 32.12 kg/m     Wt Readings from Last 3 Encounters:  05/04/20 193 lb (87.5 kg)  04/14/20 198 lb 6.4 oz (90 kg)  03/17/20 201 lb 12.8 oz (91.5 kg)     GEN: Well nourished, well developed in no acute distress HEENT: Normal NECK: No JVD; No carotid bruits CARDIAC: RRR, no murmurs, rubs, gallops RESPIRATORY:  Clear to auscultation without rales, wheezing or rhonchi  ABDOMEN: Soft, non-tender, non-distended MUSCULOSKELETAL:  1+ bilateral pitting edema; No deformity  SKIN: Warm and dry NEUROLOGIC:  Alert and oriented x 3 PSYCHIATRIC:  Normal affect   ASSESSMENT:    Acute diastolic CHF (congestive heart failure) ([place]) Admitted to [facility] with acute diastolic CHF- diuresed overnight and discharged.  This has not been an  issue before.  She was at a wedding 48 hours prior and may have had increased dietary sodium.  She has had dyspnea, edema, and orthopnea since her hip surgery in August- ? Diastolic CHF since August ? Consider PE after hip surgery ? Anemia contributing   S/P CABG x 4 CABG 2015- pleurodesis post op   CAD S/P percutaneous coronary angioplasty Cath 2019- all SVGs occluded.  S/P native RCA PCI with DES x 3  CKD stage 4 due to type 2 diabetes mellitus ([place]) Followed in [place] hypertension Labile B/P on multiple medications.  Diabetes mellitus type 2 in obese [facility]) IDDM with neuropathy, nephropathy  Fibromyalgia Chronic pain- followed by PCP  PLAN:    Joyce [name] has had lower extremity edema, dyspnea, and today she reports orthopnea since her hip surgery in August.  She was recently admitted with what sounds like acute diastolic heart failure to [facility].  She had been to a wedding couple days before this.  She was diuresed in the hospital but continues to complain of lower extremity edema and dyspnea on exertion.  Her weights have actually been trending down.  I have several concerns about Joyce [name] symptoms.  She could have acute on chronic diastolic heart failure or new systolic heart failure.  its also possible with her lower extremity edema and recent hip surgery she has had a PE.  Acute on chronic anemia may be contributing as well.  I have suggested lab work including a CBC, BMP, BNP, and D-dimer as well as an echocardiogram.  Follow-up will be depending on these studies.  Ultimately I suspect she may need more diuretics.  She will need close follow-up in the clinic.   Medication Adjustments/Labs and Tests Ordered: Current medicines are reviewed at length with the patient today.  Concerns regarding medicines are outlined above.  Orders Placed This Encounter  Procedures  . Basic Metabolic Panel (BMET)  . B Nat Peptide  . CBC  . D-Dimer, Quantitative    . EKG 12-Lead  . ECHOCARDIOGRAM COMPLETE   No orders of the defined types were placed in this encounter.   Patient Instructions  Medication Instructions:  Your physician recommends that you continue on your current medications as directed. Please refer to the Current Medication list given to you today.  *If you need a refill on your cardiac medications before your next appointment, please call your pharmacy*   Lab Work: Your physician recommends that you return for lab work in: Today   If you have labs (blood work) drawn today and your tests are completely normal, you will receive your results only by: Marland Kitchen MyChart Message (if you have MyChart) OR . A paper copy in the mail If you have any lab test that is abnormal or we need to change your treatment, we will call you to review the results.   Testing/Procedures: Your physician has requested that you have an echocardiogram. Echocardiography is a painless test that uses sound waves to create images of your heart. It provides your doctor with information about the size and shape of your heart and how well your heart's chambers and valves are working. This procedure takes approximately one hour. There are no restrictions  for this procedure.     Follow-Up: At [facility], you and your health needs are our priority.  As part of our continuing mission to provide you with exceptional heart care, we have created designated Provider Care Teams.  These Care Teams include your primary Cardiologist (physician) and Advanced Practice Providers (APPs -  Physician Assistants and Nurse Practitioners) who all work together to provide you with the care you need, when you need it.  We recommend signing up for the patient portal called "MyChart".  Sign up information is provided on this After Visit Summary.  MyChart is used to connect with patients for Virtual Visits (Telemedicine).  Patients are able to view lab/test results, encounter notes, upcoming  appointments, etc.  Non-urgent messages can be sent to your provider as well.   To learn more about what you can do with MyChart, go to [email or web address].    Your next appointment:   3 week(s)  The format for your next appointment:   In Person  Provider:   You will see one of the following Advanced Practice Providers on your designated Care Team:    Bernerd Pho, PA-C   Ermalinda Barrios, PA-C   Other Instructions Thank you for choosing [facility]!       Signed, Kerin Ransom, PA-C  05/04/2020 10:59 AM    [facility]

## 2020-05-04 NOTE — Telephone Encounter (Signed)
Orders placed.

## 2020-05-04 NOTE — Assessment & Plan Note (Signed)
Cath 2019- all SVGs occluded.  S/P native RCA PCI with DES x 3

## 2020-05-04 NOTE — Assessment & Plan Note (Signed)
Chronic pain- followed by PCP

## 2020-05-04 NOTE — Assessment & Plan Note (Signed)
Labile B/P on multiple medications.

## 2020-05-04 NOTE — Assessment & Plan Note (Signed)
Admitted to [facility] with acute diastolic CHF- diuresed overnight and discharged.  This has not been an issue before.  She was at a wedding 48 hours prior and may have had increased dietary sodium.  She has had dyspnea, edema, and orthopnea since her hip surgery in August- ? Diastolic CHF since August ? Consider PE after hip surgery ? Anemia contributing

## 2020-05-04 NOTE — Telephone Encounter (Signed)
-----   Message from Erlene Quan, [place] sent at 05/04/2020 12:36 PM EST ----- I review labs on Joyce [name] today.  Both her BNP and D-dimer were elevated.  She needs a VQ scan to r/o pulmonary embolism and I would like to increase her lasix to 80 mg daily and add Metolazone 5mg  daily for two doses-take 30 minutes prior to her Lasix dose.   Check a BMP in a week- keep f/u as scheduled.   Kerin Ransom PA-C 05/04/2020 12:35 PM

## 2020-05-05 ENCOUNTER — Telehealth: Payer: Self-pay | Admitting: Cardiology

## 2020-05-05 NOTE — Telephone Encounter (Signed)
Changed.

## 2020-05-05 NOTE — Telephone Encounter (Signed)
Patient called requesting to have her Pharmacy switched to [facility] [address] [place], [place]

## 2020-05-11 ENCOUNTER — Other Ambulatory Visit: Payer: Self-pay

## 2020-05-11 ENCOUNTER — Ambulatory Visit (HOSPITAL_COMMUNITY)
Admission: RE | Admit: 2020-05-11 | Discharge: 2020-05-11 | Disposition: A | Discharge status: Home / Self Care | Payer: Medicare Other | Source: Ambulatory Visit | Attending: Cardiology | Admitting: Cardiology

## 2020-05-11 DIAGNOSIS — R0602 Shortness of breath: Secondary | ICD-10-CM | POA: Diagnosis present

## 2020-05-11 LAB — ECHOCARDIOGRAM COMPLETE
Area-P 1/2: 2.08 cm2
S' Lateral: 2.9 cm

## 2020-05-11 NOTE — Progress Notes (Signed)
*  PRELIMINARY RESULTS* Echocardiogram 2D Echocardiogram has been performed.  Joyce [name] 05/11/2020, 11:21 AM

## 2020-05-24 NOTE — Progress Notes (Signed)
Cardiology Office Note  Date: 05/25/2020   ID: Joyce [name], DOB 11-11-1940, MRN [id number]  PCP:  Allie Dimmer, MD  Cardiologist:  Rozann Lesches, MD Electrophysiologist:  None   Chief Complaint: Follow SOB, swelling, chronic anemia, stage 4 CKD d/t DM2, Acute diastolic CHF, S/P CABG x 4, DM2  History of Present Illness: Joyce [name] is a 79 y.o. 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  History of CABG 2015 (LIMA-LAD, SVG-diagonal, SVG-OM, SVG-PDA).  Cardiac catheterization 2019 occlusion of all vein grafts.  LIMA-LAD was patent.  PCI to RCA with DES x3.  Patient saw Kerin Ransom, [place] 05/04/2020 for complaints of shortness of breath and lower extremity edema since hip surgery in August 2021.  She had recently been admitted to [facility] for acute diastolic heart failure and was diuresed during the hospital stay but continued to complain of lower extremity edema and dyspnea on exertion.  Her weights had been trending down.  Her amlodipine was reduced to 5 mg a day.  She continued to complain of lower extremity edema and shortness of breath stating she could not lay flat since her hip surgery.  Her weights had come down from her baseline.  A CBC. BMP, BNP and D-dimer as well as echocardiogram were ordered.  Patient states she is feeling much better after seeing Mr. Rosalyn Gess.  She still feels a little weak.  States she has lost some weight.  Her previous weight was 193.  Today's weight is 189.  Currently denies any shortness of breath or dyspnea on exertion.  No lower extremity edema.  States she just got approval to have her VQ scan done due to elevated D-dimer and suspicion of PE.  She states the hospital has yet to call her to schedule the VQ scan.  Her blood pressure is  elevated but she states her blood pressure has been historically elevated and she has been on multiple antihypertensive agents.  Currently she is on amlodipine 5 mg daily, lisinopril 5 mg daily.  Currently taking Lasix 80 mg daily.  Recent labs showed creatinine of 2.37 and GFR of 21.  She sees Dr. Alanson Aly in [place] for nephrology.  She has an upcoming appointment with him.  Past Medical History:  Diagnosis Date   Anemia    Arthritis    Asthma    Bilateral pleural effusion    Postoperative, status post Pleurx catheter and talc treatment - Dr. Prescott Gum   CAD (coronary artery disease)    Multivessel status post CABG 05/2014 -  LIMA to LAD, SVG to diagonal, SVG to OM, SVG to PDA   Chronic kidney disease (CKD), stage III (moderate) (HCC)    Chronic lower back pain    COPD (chronic obstructive pulmonary disease) ([place])    pt. denies   Essential hypertension    Family history of adverse reaction to anesthesia    Mom was slow to wake up   Fibromyalgia    neuropathy feet and fingers   History of pneumonia    Hyperlipidemia    Myocardial infarction ([place])    Type II diabetes mellitus ([place])     Past Surgical History:  Procedure Laterality Date   ABDOMINAL HYSTERECTOMY  1980's   ANTERIOR CERVICAL DECOMP/DISCECTOMY FUSION  2000's   APPENDECTOMY  1970's   BACK SURGERY  BIOPSY  08/28/2019   Procedure: BIOPSY;  Surgeon: Rogene Houston, MD;  [facility];  Service: Endoscopy;;  duodenal   CHEST TUBE INSERTION Left 08/07/2014   Procedure: INSERTION PLEURAL DRAINAGE CATHETER;  Surgeon: Ivin Poot, MD;  Location: [place];  Service: Thoracic;  Laterality: Left;   CHEST TUBE INSERTION Right 08/12/2014   Procedure: INSERTION PLEURAL DRAINAGE CATHETER;  Surgeon: Ivin Poot, MD;  Location: [place];  Service: Thoracic;  Laterality: Right;   CHOLECYSTECTOMY  ~ 2005   COLONOSCOPY N/A 08/28/2019   Procedure: COLONOSCOPY;  Surgeon: Rogene Houston, MD;   [facility];  Service: Endoscopy;  Laterality: N/A;  100   CORONARY ARTERY BYPASS GRAFT N/A 06/15/2014   Procedure: CORONARY ARTERY BYPASS GRAFTING (CABG);  Surgeon: Ivin Poot, MD;  Location: [place];  Service: Open Heart Surgery;  Laterality: N/A;   CORONARY ATHERECTOMY N/A 05/22/2018   Procedure: CORONARY ATHERECTOMY;  Surgeon: Belva Crome, MD;  Location: [facility];  Service: Cardiovascular;  Laterality: N/A;   CORONARY STENT INTERVENTION N/A 05/22/2018   Procedure: CORONARY STENT INTERVENTION;  Surgeon: Belva Crome, MD;  Location: [place] [facility];  Service: Cardiovascular;  Laterality: N/A;  rca    ESOPHAGOGASTRODUODENOSCOPY N/A 08/28/2019   Procedure: ESOPHAGOGASTRODUODENOSCOPY (EGD);  Surgeon: Rogene Houston, MD;  [facility];  Service: Endoscopy;  Laterality: N/A;   INTRAOPERATIVE TRANSESOPHAGEAL ECHOCARDIOGRAM N/A 06/15/2014   Procedure: INTRAOPERATIVE TRANSESOPHAGEAL ECHOCARDIOGRAM;  Surgeon: Ivin Poot, MD;  Location: [place];  Service: Open Heart Surgery;  Laterality: N/A;   LEFT HEART CATH AND CORS/GRAFTS ANGIOGRAPHY N/A 05/20/2018   Procedure: LEFT HEART CATH AND CORS/GRAFTS ANGIOGRAPHY;  Surgeon: Lorretta Harp, MD;  Location: [facility];  Service: Cardiovascular;  Laterality: N/A;   LEFT HEART CATHETERIZATION WITH CORONARY ANGIOGRAM N/A 06/11/2014   Procedure: LEFT HEART CATHETERIZATION WITH CORONARY ANGIOGRAM;  Surgeon: Blane Ohara, MD;  Location: [facility];  Service: Cardiovascular;  Laterality: N/A;   LUMBAR DISC SURGERY  1980's X 1; 1990's X  2000's X 1   POLYPECTOMY  08/28/2019   Procedure: POLYPECTOMY;  Surgeon: Rogene Houston, MD;  [facility];  Service: Endoscopy;;   REMOVAL OF PLEURAL DRAINAGE CATHETER Bilateral 02/11/2015   Procedure: REMOVAL OF PLEURAL DRAINAGE CATHETER;  Surgeon: Ivin Poot, MD;  Location: [place];  Service: Thoracic;  Laterality: Bilateral;   TALC PLEURODESIS  Bilateral 02/11/2015   Procedure: Pietro Cassis;  Surgeon: Ivin Poot, MD;  Location: [place];  Service: Thoracic;  Laterality: Bilateral;   TONSILLECTOMY  ~ [place] Right 02/02/2020   Procedure: RIGHT TOTAL HIP ARTHROPLASTY ANTERIOR APPROACH;  Surgeon: Frederik Pear, MD;  Location: [facility];  Service: Orthopedics;  Laterality: Right;    Current Outpatient Medications  Medication Sig Dispense Refill   ACCU-CHEK AVIVA PLUS test strip Inject 1 strip as directed 4 (four) times daily.  2   albuterol (VENTOLIN HFA) 108 (90 Base) MCG/ACT inhaler Inhale 2 puffs into the lungs every 4 (four) hours as needed for wheezing or shortness of breath. 8 g 5   amLODipine (NORVASC) 5 MG tablet Take 5 mg by mouth daily.     atorvastatin (LIPITOR) 40 MG tablet Take 40 mg by mouth every evening.      busPIRone (BUSPAR) 5 MG tablet Take 5 mg by mouth daily as needed (anxiety).     calcitRIOL (ROCALTROL) 0.25 MCG capsule Take  0.25 mcg by mouth every Monday, Wednesday, and Friday.     Cholecalciferol (VITAMIN D3) 2000 units TABS Take 4,000-6,000 Units by mouth See admin instructions. Take 4000 units by mouth every other day alternating with 6000 units on alternate days     clopidogrel (PLAVIX) 75 MG tablet TAKE ONE TABLET BY MOUTH DAILY WITH BREAKFAST 90 tablet 1   fluticasone (FLONASE) 50 MCG/ACT nasal spray Place 1 spray into both nostrils daily.      furosemide (LASIX) 80 MG tablet Take 1 tablet (80 mg total) by mouth daily. 90 tablet 3   hydrALAZINE (APRESOLINE) 100 MG tablet Take 100 mg by mouth 3 (three) times daily.     HYDROmorphone (DILAUDID) 2 MG tablet Take 1 tablet (2 mg total) by mouth every 4 (four) hours as needed for severe pain. 30 tablet 0   insulin glargine (LANTUS) 100 UNIT/ML injection Inject 40 Units into the skin at bedtime.      insulin lispro (HUMALOG) 100 UNIT/ML KiwkPen Inject 8 Units into the skin 3 (three) times daily with meals.      isosorbide  mononitrate (IMDUR) 60 MG 24 hr tablet TAKE ONE TABLET BY MOUTH DAILY (Patient taking differently: Take 60 mg by mouth daily. ) 90 tablet 2   lisinopril (PRINIVIL,ZESTRIL) 5 MG tablet Take 5 mg by mouth every evening.      montelukast (SINGULAIR) 10 MG tablet Take 10 mg by mouth at bedtime.     Multiple Vitamin (MULTIVITAMIN WITH MINERALS) TABS tablet Take 1 tablet by mouth daily.     naloxone (NARCAN) nasal spray 4 mg/0.1 mL Place 1 spray into the nose daily as needed (accidental overdose.). Use as directed.      nitroGLYCERIN (NITROSTAT) 0.4 MG SL tablet Place 1 tablet (0.4 mg total) under the tongue every 5 (five) minutes x 3 doses as needed for chest pain. 25 tablet 3   polyethylene glycol (MIRALAX / GLYCOLAX) 17 g packet Take 17 g by mouth daily as needed for moderate constipation.     primidone (MYSOLINE) 50 MG tablet Take 100 mg by mouth daily.   5   Probiotic Product (PROBIOTIC DAILY PO) Take 1 capsule by mouth in the morning, at noon, and at bedtime.      psyllium (METAMUCIL) 58.6 % packet Take by mouth daily.      rOPINIRole (REQUIP) 1 MG tablet Take 1 mg by mouth 3 (three) times daily as needed (muscle spasms).     Tiotropium Bromide-Olodaterol (STIOLTO RESPIMAT) 2.5-2.5 MCG/ACT AERS Take 2 puffs by mouth daily. 4 g 11   tiZANidine (ZANAFLEX) 2 MG tablet Take 1 tablet (2 mg total) by mouth every 6 (six) hours as needed. 60 tablet 0   traMADol (ULTRAM) 50 MG tablet Take 100 mg by mouth every 8 (eight) hours as needed for moderate pain.      Current Facility-Administered Medications  Medication Dose Route Frequency Provider Last Rate Last Admin   albuterol (PROVENTIL) (2.5 MG/3ML) 0.083% nebulizer solution 2.5 mg  2.5 mg Nebulization Q6H Byrum, Rose Fillers, MD       Allergies:  Carvedilol, Codeine, and Metoprolol   Social History: The patient  reports that she has never smoked. She has never used smokeless tobacco. She reports that she does not drink alcohol and does not use  drugs.   Family History: The patient's family history includes Alcoholism in her brother and brother; Arthritis in her brother; CAD in her paternal grandmother and paternal uncle; Cancer in her sister; Diabetes  in her mother; Gout in her brother; Heart attack in her paternal grandfather and sister; Heart disease in her sister; Hypertension in her mother; Osteoporosis in her sister; Stroke in her father.   ROS:  Please see the history of present illness. Otherwise, complete review of systems is positive for none.  All other systems are reviewed and negative.   Physical Exam: VS:  BP (!) 178/62    Pulse 74    Ht 5\' 5"  (1.651 m)    Wt 189 lb 9.6 oz (86 kg)    SpO2 96%    BMI 31.55 kg/m , BMI Body mass index is 31.55 kg/m.  Wt Readings from Last 3 Encounters:  05/25/20 189 lb 9.6 oz (86 kg)  05/04/20 193 lb (87.5 kg)  04/14/20 198 lb 6.4 oz (90 kg)    General: Patient appears comfortable at rest. Neck: Supple, no elevated JVP or carotid bruits, no thyromegaly. Lungs: Clear to auscultation, nonlabored breathing at rest. Cardiac: Irregular rate and rhythm, no S3 or significant systolic murmur, no pericardial rub. Extremities: No pitting edema, distal pulses 2+. Skin: Warm and dry.  Musculoskeletal: No kyphosis. Neuropsychiatric: Alert and oriented x3, affect grossly appropriate.  ECG:  An ECG dated 05/25/2020 was personally reviewed today and demonstrated:  Sinus rhythm with premature supraventricular complexes rate of 75, prolonged QT with QT/QTC of 452/506 ms.  Recent Labwork: 08/01/2019: ALT 16; AST 18 05/04/2020: B Natriuretic Peptide 554.0; BUN 35; Creatinine, Ser 2.37; Hemoglobin 10.1; Platelets 182; Potassium 4.5; Sodium 138     Component Value Date/Time   CHOL 111 05/18/2018 0418   TRIG 116 05/18/2018 0418   HDL 41 05/18/2018 0418   CHOLHDL 2.7 05/18/2018 0418   VLDL 23 05/18/2018 0418   LDLCALC 47 05/18/2018 0418    Other Studies Reviewed Today:   Echocardiogram  05/11/2020 1. Left ventricular ejection fraction, by estimation, is 65 to 70%. The left ventricle has normal function. The left ventricle has no regional wall motion abnormalities. There is mild left ventricular hypertrophy. Left ventricular diastolic parameters are consistent with Grade I diastolic dysfunction (impaired relaxation). 2. Right ventricular systolic function is normal. The right ventricular size is normal. 3. Left atrial size was mild to moderately dilated. 4. The mitral valve is grossly normal. Mild mitral valve regurgitation. 5. The aortic valve is abnormal. Aortic valve regurgitation is mild. Mild aortic valve sclerosis is present, with no evidence of aortic valve stenosis. 6. The inferior vena cava is normal in size with greater than 50% respiratory variability, suggesting right atrial pressure of 3 mmHg. Comparison(s): The left ventricular function is unchanged.  05/20/2018 LEFT HEART CATH AND CORS/GRAFTS ANGIOGRAPHY  Conclusion   Prox LAD to Mid LAD lesion is 100% stenosed.  Origin lesion is 100% stenosed.  Origin to Prox Graft lesion is 100% stenosed.  Origin to Prox Graft lesion is 100% stenosed.  Ramus lesion is 95% stenosed.  Prox Cx to Mid Cx lesion is 90% stenosed.  Prox LAD lesion is 80% stenosed.  Prox RCA lesion is 90% stenosed.  Mid RCA lesion is 99% stenosed.  Dist RCA lesion is 60% stenosed.    IMPRESSION: Ms. Grenier is now 4 years post coronary artery bypass grafting x4.  All of her vein grafts are occluded at the aorta.  LIMA is patent to LAD.  She has high-grade calcified proximal mid and distal dominant RCA stenosis along with a high-grade mid ramus branch stenosis just proximal to the occluded vein graft.  I used 100 cc  of contrast and and decided to stage her intervention given her chronic renal insufficiency.  I performed a femoral angiogram and Mynx closed her right common femoral puncture site.  I did give her 40 mg of IV Lasix  because of an elevated LVEDP of 19 as well as hydralazine IV for retention.  She left the lab in stable condition..  Coronary Diagrams  Diagnostic Dominance: Right    05/22/2018 CORONARY STENT INTERVENTION  CORONARY ATHERECTOMY  Conclusion   Complex with orbital atherectomy followed by stenting of the proximal mid and distal RCA.  The mid and distal lesions were reduced from 99 and 70% stenosis to 0 and 0% stenosis using a 2.75 x 34 mm Onyx postdilated to 3.0 mm in diameter with TIMI grade III flow.  The proximal 85% RCA with TIMI grade III flow was reduced to 0% using a 22 x 3.5 mm Onyx postdilated to 3.75 mm in diameter with resultant TIMI grade III flow.  RECOMMENDATIONS:   Aspirin and Plavix dual antiplatelet therapy for at least 30 days and preferably for 3 months at which point Plavix monotherapy can be used to complete a 59-month duration of therapy.  Diagnostic Dominance: Right  Intervention         Assessment and Plan:  1. Diastolic CHF, chronic ([place])   2. SOB (shortness of breath)   3. CAD in native artery   4. Essential hypertension   5. Chronic anemia   6. CKD stage 4 due to type 2 diabetes mellitus ([place])    1. Diastolic CHF, chronic ([place]) Recent admission to [facility] for diastolic heart failure.  She was diuresed and eventually discharged.  States she is feeling much better without any significant shortness of breath.  Weight today on arrival was 189.  On 04/14/2020 her weight was 198.  2. SOB (shortness of breath) Recent complaint of shortness of breath.  Seen by Kerin Ransom in [place] on May 04, 2020 he ordered a BNP which was 554 and a D-dimer which is 1.89.  Hemoglobin was 10.1 and hematocrit 31.7.  He increased her Lasix to 80 mg daily and metolazone 5 mg daily for 2 doses  3. CAD in native artery History of bypass surgery with subsequent occlusion of 3 of the venous grafts.  Also a recent orbital atherectomy of RCA with stent  placement in 2019.  4. Essential hypertension Blood pressure which historically has not been well controlled.  BP today is 178/62.  Continue amlodipine 5 mg p.o. daily.  Continue furosemide 80 mg daily.  Continue hydralazine 100 mg p.o. twice daily.  Continue lisinopril 5 mg daily.  5. Chronic anemia Patient has a long history of chronic anemia.  Recent hemoglobin and hematocrit were 10.1 and 31.7.  She has anemia of chronic disease secondary to stage IV chronic kidney disease.  She has an upcoming follow-up with her nephrologist.  6. CKD stage 4 due to type 2 diabetes mellitus [facility]) She sees Dr. Alphonsa Gin nephrology in [place].  States she has an upcoming appointment with him after the first of the year.  Medication Adjustments/Labs and Tests Ordered: Current medicines are reviewed at length with the patient today.  Concerns regarding medicines are outlined above.   Disposition: Follow-up with Dr. Domenic Polite or APP 3 months  Signed, Levell July, NP 05/25/2020 10:44 AM    [facility] [place], [place], [place] [address] Phone: [phone]; Fax: [phone]

## 2020-05-25 ENCOUNTER — Ambulatory Visit (INDEPENDENT_AMBULATORY_CARE_PROVIDER_SITE_OTHER): Payer: Medicare Other | Admitting: Family Medicine

## 2020-05-25 ENCOUNTER — Encounter: Payer: Self-pay | Admitting: Family Medicine

## 2020-05-25 VITALS — BP 178/62 | HR 74 | Ht 65.0 in | Wt 189.6 lb

## 2020-05-25 DIAGNOSIS — I5032 Chronic diastolic (congestive) heart failure: Secondary | ICD-10-CM

## 2020-05-25 DIAGNOSIS — D649 Anemia, unspecified: Secondary | ICD-10-CM

## 2020-05-25 DIAGNOSIS — I2510 Atherosclerotic heart disease of native coronary artery without angina pectoris: Secondary | ICD-10-CM | POA: Diagnosis not present

## 2020-05-25 DIAGNOSIS — I10 Essential (primary) hypertension: Secondary | ICD-10-CM | POA: Diagnosis not present

## 2020-05-25 DIAGNOSIS — R0602 Shortness of breath: Secondary | ICD-10-CM | POA: Diagnosis not present

## 2020-05-25 DIAGNOSIS — E1122 Type 2 diabetes mellitus with diabetic chronic kidney disease: Secondary | ICD-10-CM

## 2020-05-25 DIAGNOSIS — N184 Chronic kidney disease, stage 4 (severe): Secondary | ICD-10-CM

## 2020-05-25 NOTE — Patient Instructions (Signed)
Medication Instructions:  Continue all current medications.  Labwork: none  Testing/Procedures: none  Follow-Up: 3 months   Any Other Special Instructions Will Be Listed Below (If Applicable). Follow up with kidney doctor as planned.  If you need a refill on your cardiac medications before your next appointment, please call your pharmacy.

## 2020-05-25 NOTE — Addendum Note (Signed)
Addended by: Laurine Blazer on: 05/25/2020 12:09 PM   Modules accepted: Orders

## 2020-06-02 ENCOUNTER — Ambulatory Visit: Payer: Medicare Other | Admitting: Cardiology

## 2020-06-11 NOTE — Addendum Note (Signed)
Addended by: Levonne Hubert on: 06/11/2020 12:25 PM   Modules accepted: Orders

## 2020-06-23 ENCOUNTER — Encounter (HOSPITAL_COMMUNITY)
Admission: RE | Admit: 2020-06-23 | Discharge: 2020-06-23 | Disposition: A | Discharge status: Home / Self Care | Payer: Medicare Other | Source: Ambulatory Visit | Attending: Cardiology | Admitting: Cardiology

## 2020-06-23 ENCOUNTER — Other Ambulatory Visit (HOSPITAL_COMMUNITY): Payer: Medicare Other

## 2020-06-23 ENCOUNTER — Ambulatory Visit (HOSPITAL_COMMUNITY)
Admission: RE | Admit: 2020-06-23 | Discharge: 2020-06-23 | Disposition: A | Discharge status: Home / Self Care | Payer: Medicare Other | Source: Ambulatory Visit | Attending: Cardiology | Admitting: Cardiology

## 2020-06-23 ENCOUNTER — Other Ambulatory Visit: Payer: Self-pay

## 2020-06-23 ENCOUNTER — Telehealth: Payer: Self-pay | Admitting: *Deleted

## 2020-06-23 DIAGNOSIS — R079 Chest pain, unspecified: Secondary | ICD-10-CM | POA: Insufficient documentation

## 2020-06-23 MED ORDER — TECHNETIUM TO 99M ALBUMIN AGGREGATED
4.4000 | Freq: Once | INTRAVENOUS | Status: AC | PRN
  Administered 2020-06-23: 09:00:00 4.4 via INTRAVENOUS

## 2020-06-23 NOTE — Telephone Encounter (Signed)
Order placed per Magda Paganini in [facility].

## 2020-06-24 ENCOUNTER — Telehealth: Payer: Self-pay

## 2020-06-24 NOTE — Telephone Encounter (Signed)
Patient contacted. Verbalized understanding of results and strict instructions for continued SOB.

## 2020-06-24 NOTE — Telephone Encounter (Signed)
-----   Message from Erlene Quan, [place] sent at 06/24/2020  7:55 AM EST ----- Please let the patient know her lung scan did not suggest a blood clot to her lungs.  Keep f/u as scheduled with Dr Domenic Polite. If she has continued SOB she will need to be seen sooner in the clinic.  Kerin Ransom PA-C 06/24/2020 7:55 AM

## 2020-07-01 ENCOUNTER — Telehealth: Payer: Self-pay | Admitting: Family Medicine

## 2020-07-01 MED ORDER — CLOPIDOGREL BISULFATE 75 MG PO TABS
75.0000 mg | ORAL_TABLET | Freq: Every day | ORAL | 3 refills | Status: DC
Start: 2020-07-01 — End: 2021-03-30

## 2020-07-01 NOTE — Telephone Encounter (Signed)
Refill complete 

## 2020-07-01 NOTE — Telephone Encounter (Signed)
    1. Which medications need to be refilled? (please list name of each medication and dose if known)  PLAVIX 75  2. Which pharmacy/location (including street and city if local pharmacy) is medication to be sent to?  [facility] [address]  3. Do they need a 30 day or 90 day supply? [place]

## 2020-08-23 ENCOUNTER — Ambulatory Visit: Payer: Medicare Other | Admitting: Cardiology

## 2020-09-03 ENCOUNTER — Other Ambulatory Visit: Payer: Self-pay | Admitting: Cardiology

## 2020-10-11 ENCOUNTER — Encounter: Payer: Self-pay | Admitting: Cardiology

## 2020-10-11 ENCOUNTER — Other Ambulatory Visit: Payer: Self-pay

## 2020-10-11 ENCOUNTER — Ambulatory Visit: Payer: Medicare Other | Admitting: Cardiology

## 2020-10-11 VITALS — BP 168/58 | HR 92 | Ht 65.0 in | Wt 193.0 lb

## 2020-10-11 DIAGNOSIS — E782 Mixed hyperlipidemia: Secondary | ICD-10-CM

## 2020-10-11 DIAGNOSIS — I5032 Chronic diastolic (congestive) heart failure: Secondary | ICD-10-CM

## 2020-10-11 DIAGNOSIS — I10 Essential (primary) hypertension: Secondary | ICD-10-CM

## 2020-10-11 DIAGNOSIS — I25119 Atherosclerotic heart disease of native coronary artery with unspecified angina pectoris: Secondary | ICD-10-CM | POA: Diagnosis not present

## 2020-10-11 DIAGNOSIS — N184 Chronic kidney disease, stage 4 (severe): Secondary | ICD-10-CM

## 2020-10-11 NOTE — Progress Notes (Signed)
Cardiology Office Note  Date: 10/11/2020   ID: LADIAMOND [name], DOB 1941-01-01, MRN [id number]  PCP:  Allie Dimmer, MD  Cardiologist:  Rozann Lesches, MD Electrophysiologist:  None   Chief Complaint  Patient presents with  . Cardiac follow-up    History of Present Illness: Joyce [name] is a 80 y.o. female last seen in November 2021 by Mr. Leonides Sake NP.  She is here for a follow-up visit.  From a cardiac perspective, she does not describe any progressive angina symptoms.  I reviewed her recent lab work as outlined below.  She continues to follow with her nephrologist, anticipates starting peritoneal dialysis over the next few months.  We went over her medications as listed below.  She states that she has been compliant with therapy, no spontaneous bleeding problems on Plavix.  She has tolerated Lipitor at intermediate dose, last LDL was 71.  Echocardiogram from November of last year revealed LVEF 65 to 70% with mild diastolic dysfunction, also calcified aortic valve that was sclerotic but not stenotic.  Past Medical History:  Diagnosis Date  . Anemia   . Arthritis   . Asthma   . Bilateral pleural effusion    Postoperative, status post Pleurx catheter and talc treatment - Dr. Prescott Gum  . 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 Chronic kidney disease (CKD), stage III (moderate) (HCC)   . Chronic lower back pain   . COPD (chronic obstructive pulmonary disease) ([place])   . Essential hypertension   . Fibromyalgia   . History of pneumonia   . Hyperlipidemia   . Myocardial infarction ([place])   . Type II diabetes mellitus ([place])     Past Surgical History:  Procedure Laterality Date  . ABDOMINAL HYSTERECTOMY  1980's  . ANTERIOR CERVICAL DECOMP/DISCECTOMY FUSION  2000's  . APPENDECTOMY  1970's  . BACK SURGERY    . BIOPSY  08/28/2019   Procedure: BIOPSY;  Surgeon: Rogene Houston, MD;   [facility];  Service: Endoscopy;;  duodenal  . CHEST TUBE INSERTION Left 08/07/2014   Procedure: INSERTION PLEURAL DRAINAGE CATHETER;  Surgeon: Ivin Poot, MD;  Location: [facility];  Service: Thoracic;  Laterality: Left;  . CHEST TUBE INSERTION Right 08/12/2014   Procedure: INSERTION PLEURAL DRAINAGE CATHETER;  Surgeon: Ivin Poot, MD;  Location: [facility];  Service: Thoracic;  Laterality: Right;  . CHOLECYSTECTOMY  ~ 2005  . COLONOSCOPY N/A 08/28/2019   Procedure: COLONOSCOPY;  Surgeon: Rogene Houston, MD;  [facility];  Service: Endoscopy;  Laterality: N/A;  100  . CORONARY ARTERY BYPASS GRAFT N/A 06/15/2014   Procedure: CORONARY ARTERY BYPASS GRAFTING (CABG);  Surgeon: Ivin Poot, MD;  Location: [facility];  Service: Open Heart Surgery;  Laterality: N/A;  . CORONARY ATHERECTOMY N/A 05/22/2018   Procedure: CORONARY ATHERECTOMY;  Surgeon: Belva Crome, MD;  Location: [facility];  Service: Cardiovascular;  Laterality: N/A;  . CORONARY STENT INTERVENTION N/A 05/22/2018   Procedure: CORONARY STENT INTERVENTION;  Surgeon: Belva Crome, MD;  Location: [facility];  Service: Cardiovascular;  Laterality: N/A;  rca   . ESOPHAGOGASTRODUODENOSCOPY N/A 08/28/2019   Procedure: ESOPHAGOGASTRODUODENOSCOPY (EGD);  Surgeon: Rogene Houston, MD;  [facility];  Service: Endoscopy;  Laterality: N/A;  . INTRAOPERATIVE TRANSESOPHAGEAL ECHOCARDIOGRAM N/A 06/15/2014   Procedure: INTRAOPERATIVE TRANSESOPHAGEAL ECHOCARDIOGRAM;  Surgeon: Ivin Poot, MD;  Location: [facility];  Service: Open Heart Surgery;  Laterality: N/A;  . LEFT HEART CATH AND CORS/GRAFTS ANGIOGRAPHY N/A 05/20/2018   Procedure: LEFT HEART CATH AND CORS/GRAFTS ANGIOGRAPHY;  Surgeon: Lorretta Harp, MD;  Location: [facility];  Service: Cardiovascular;  Laterality: N/A;  . LEFT HEART CATHETERIZATION WITH CORONARY ANGIOGRAM N/A 06/11/2014   Procedure: LEFT HEART CATHETERIZATION WITH  CORONARY ANGIOGRAM;  Surgeon: Blane Ohara, MD;  Location: [facility] CATH [facility];  Service: Cardiovascular;  Laterality: N/A;  . LUMBAR DISC SURGERY  1980's X 1; 1990's X  2000's X 1  . POLYPECTOMY  08/28/2019   Procedure: POLYPECTOMY;  Surgeon: Rogene Houston, MD;  [facility];  Service: Endoscopy;;  . REMOVAL OF PLEURAL DRAINAGE CATHETER Bilateral 02/11/2015   Procedure: REMOVAL OF PLEURAL DRAINAGE CATHETER;  Surgeon: Ivin Poot, MD;  Location: [facility];  Service: Thoracic;  Laterality: Bilateral;  . TALC PLEURODESIS Bilateral 02/11/2015   Procedure: Pietro Cassis;  Surgeon: Ivin Poot, MD;  Location: [facility];  Service: Thoracic;  Laterality: Bilateral;  . TONSILLECTOMY  ~ 1950  . TOTAL HIP ARTHROPLASTY Right 02/02/2020   Procedure: RIGHT TOTAL HIP ARTHROPLASTY ANTERIOR APPROACH;  Surgeon: Frederik Pear, MD;  [facility]: [facility];  Service: Orthopedics;  Laterality: Right;    Current Outpatient Medications  Medication Sig Dispense Refill  . ACCU-CHEK AVIVA PLUS test strip Inject 1 strip as directed 4 (four) times daily.  2  . albuterol (VENTOLIN HFA) 108 (90 Base) MCG/ACT inhaler Inhale 2 puffs into the lungs every 4 (four) hours as needed for wheezing or shortness of breath. 8 g 5  . amLODipine (NORVASC) 5 MG tablet Take 5 mg by mouth daily.    Marland Kitchen atorvastatin (LIPITOR) 40 MG tablet Take 40 mg by mouth every evening.     . busPIRone (BUSPAR) 5 MG tablet Take 5 mg by mouth daily as needed (anxiety).    . calcitRIOL (ROCALTROL) 0.25 MCG capsule Take 0.25 mcg by mouth every Monday, Wednesday, and Friday.    . Cholecalciferol (VITAMIN D3) 2000 units TABS Take 4,000-6,000 Units by mouth See admin instructions. Take 4000 units by mouth every other day alternating with 6000 units on alternate days    . clopidogrel (PLAVIX) 75 MG tablet Take 1 tablet (75 mg total) by mouth daily with breakfast. 90 tablet 3  . fluticasone (FLONASE) 50 MCG/ACT nasal spray Place 1 spray into both nostrils daily.      . furosemide (LASIX) 80 MG tablet Take 1 tablet (80 mg total) by mouth daily. 90 tablet 3  . hydrALAZINE (APRESOLINE) 100 MG tablet Take 100 mg by mouth 3 (three) times daily.    Marland Kitchen HYDROmorphone (DILAUDID) 2 MG tablet Take 1 tablet (2 mg total) by mouth every 4 (four) hours as needed for severe pain. 30 tablet 0  . insulin glargine (LANTUS) 100 UNIT/ML injection Inject 40 Units into the skin at bedtime.     . insulin lispro (HUMALOG) 100 UNIT/ML KiwkPen Inject 8 Units into the skin 3 (three) times daily with meals.     . isosorbide mononitrate (IMDUR) 60 MG 24 hr tablet TAKE 1 TABLET BY MOUTH DAILY 90 tablet 2  . lisinopril (ZESTRIL) 2.5 MG tablet Take 2.5 mg by mouth daily.    . montelukast (SINGULAIR) 10 MG tablet Take 10 mg by mouth at bedtime.    . Multiple Vitamin (MULTIVITAMIN WITH MINERALS) TABS tablet Take 1 tablet by mouth daily.    Marland Kitchen  naloxone (NARCAN) nasal spray 4 mg/0.1 mL Place 1 spray into the nose daily as needed (accidental overdose.). Use as directed.     . nitroGLYCERIN (NITROSTAT) 0.4 MG SL tablet Place 1 tablet (0.4 mg total) under the tongue every 5 (five) minutes x 3 doses as needed for chest pain. 25 tablet 3  . polyethylene glycol (MIRALAX / GLYCOLAX) 17 g packet Take 17 g by mouth daily as needed for moderate constipation.    . primidone (MYSOLINE) 50 MG tablet Take 100 mg by mouth daily.   5  . Probiotic Product (PROBIOTIC DAILY PO) Take 1 capsule by mouth in the morning, at noon, and at bedtime.     . psyllium (METAMUCIL) 58.6 % packet Take by mouth daily.     Marland Kitchen rOPINIRole (REQUIP) 1 MG tablet Take 1 mg by mouth 3 (three) times daily as needed (muscle spasms).    . Tiotropium Bromide-Olodaterol (STIOLTO RESPIMAT) 2.5-2.5 MCG/ACT AERS Take 2 puffs by mouth daily. 4 g 11  . tiZANidine (ZANAFLEX) 2 MG tablet Take 1 tablet (2 mg total) by mouth every 6 (six) hours as needed. 60 tablet 0  . traMADol (ULTRAM) 50 MG tablet Take 100 mg by mouth every 8 (eight) hours as needed  for moderate pain.      Current Facility-Administered Medications  Medication Dose Route Frequency Provider Last Rate Last Admin  . albuterol (PROVENTIL) (2.5 MG/3ML) 0.083% nebulizer solution 2.5 mg  2.5 mg Nebulization Q6H Byrum, Rose Fillers, MD       Allergies:  Carvedilol, Codeine, and Metoprolol   ROS: No palpitations or syncope.  Physical Exam: VS:  BP (!) 168/58   Pulse 92   Ht 5\' 5"  (1.651 m)   Wt 193 lb (87.5 kg)   SpO2 97%   BMI 32.12 kg/m , BMI Body mass index is 32.12 kg/m.  Wt Readings from Last 3 Encounters:  10/11/20 193 lb (87.5 kg)  05/25/20 189 lb 9.6 oz (86 kg)  05/04/20 193 lb (87.5 kg)    General: Patient appears comfortable at rest. HEENT: Conjunctiva and lids normal, wearing a mask. Neck: Supple, no elevated JVP or carotid bruits, no thyromegaly. Lungs: Clear to auscultation, nonlabored breathing at rest. Cardiac: Regular rate and rhythm, no S3, 3/6 basal systolic murmur, no pericardial rub. Abdomen: Soft, bowel sounds present. Extremities: No pitting edema.  ECG:  An ECG dated 05/25/2020 was personally reviewed today and demonstrated:  Sinus rhythm with frequent PACs.  Recent Labwork: 05/04/2020: B Natriuretic Peptide 554.0; BUN 35; Creatinine, Ser 2.37; Hemoglobin 10.1; Platelets 182; Potassium 4.5; Sodium 138  April 2022: Hemoglobin 9.8, platelets 200, BUN 81, creatinine 3.19, potassium 5.1, AST 16, ALT 10, cholesterol 157, HDL 54, LDL 71, triglycerides 162, hemoglobin A1c 5.8%  Other Studies Reviewed Today:  Echocardiogram 05/11/2020: 1. Left ventricular ejection fraction, by estimation, is 65 to 70%. The  left ventricle has normal function. The left ventricle has no regional  wall motion abnormalities. There is mild left ventricular hypertrophy.  Left ventricular diastolic parameters  are consistent with Grade I diastolic dysfunction (impaired relaxation).  2. Right ventricular systolic function is normal. The right ventricular  size is  normal.  3. Left atrial size was mild to moderately dilated.  4. The mitral valve is grossly normal. Mild mitral valve regurgitation.  5. The aortic valve is abnormal. Aortic valve regurgitation is mild. Mild  aortic valve sclerosis is present, with no evidence of aortic valve  stenosis.  6. The inferior vena cava is  normal in size with greater than 50%  respiratory variability, suggesting right atrial pressure of 3 mmHg.   Assessment and Plan:  1.  Multivessel CAD status post CABG in 2015 with subsequently documented graft disease status post atherectomy and stent intervention to the RCA in 2019, LIMA to LAD patent.  She does not describe any active angina at this time.  Continue medical therapy including Plavix, Norvasc, Lipitor, Imdur, and as needed nitroglycerin.  She should be stable in terms of preoperative assessment for placement of a peritoneal dialysis catheter.  Plavix can be held 5 to 7 days prior if necessary.  2.  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  She continues to follow with nephrology and anticipates initiation of peritoneal dialysis in the next few months.  Recent creatinine 3.19.  3.  Essential hypertension complicated by progressive renal failure.  She remains on multimodal therapy as noted above.  4.  Mixed hyperlipidemia, continues on Lipitor with recent LDL 71.  5.  Aortic valve sclerosis without stenosis.  Medication Adjustments/Labs and Tests Ordered: Current medicines are reviewed at length with the patient today.  Concerns regarding medicines are outlined above.   Tests Ordered: No orders of the defined types were placed in this encounter.   Medication Changes: No orders of the defined types were placed in this encounter.   Disposition:  Follow up 6 months.  Signed, Satira Sark, MD, [facility] 10/11/2020 4:10 PM    [facility] [place], [place], [place] [address] Phone: [phone]; Fax:  [phone]

## 2020-10-11 NOTE — Patient Instructions (Addendum)
Medication Instructions:  Continue all current medications.   Labwork: none  Testing/Procedures: none  Follow-Up: 6 months   Any Other Special Instructions Will Be Listed Below (If Applicable).   If you need a refill on your cardiac medications before your next appointment, please call your pharmacy.  

## 2020-10-14 ENCOUNTER — Other Ambulatory Visit: Payer: Self-pay | Admitting: Emergency Medicine

## 2020-10-18 ENCOUNTER — Telehealth: Payer: Self-pay | Admitting: Cardiology

## 2020-10-18 MED ORDER — BISOPROLOL FUMARATE 5 MG PO TABS: 5.0000 mg | ORAL_TABLET | Freq: Every day | ORAL | 1 refills | Status: DC

## 2020-10-18 NOTE — Telephone Encounter (Signed)
Pt c/o palpitations/weakness/SOB/chest tightness - has had 3 episodes lasting 20 mins at a time since LOV - doesn't know what HR was at the time of her episodes - has taken 2 NTG each time she has an episode which relieves her symptoms - denies any med changes

## 2020-10-18 NOTE — Telephone Encounter (Signed)
Per phone call from pt- since her visit last week w/ Dr. Domenic Polite she's had 3 spells where her heart speeds up and beats really hard and then leaves her very weak. Stated she's had to take the nitro each time and helps her.   She does have shortness of breath when this happens.   [phone]

## 2020-10-18 NOTE — Telephone Encounter (Signed)
Noted.  Chart lists allergies (which are really intolerances) to Coreg and metoprolol.  Let's start her on bisoprolol 5 mg daily and see if that helps control her symptoms in addition to the remainder of her medications.

## 2020-10-18 NOTE — Telephone Encounter (Signed)
Pt voiced understanding - bisoprolol sent to [facility] as requested

## 2020-10-26 ENCOUNTER — Encounter: Payer: Self-pay | Admitting: Emergency Medicine

## 2020-10-26 ENCOUNTER — Ambulatory Visit: Payer: Medicare Other | Admitting: Emergency Medicine

## 2020-10-26 ENCOUNTER — Other Ambulatory Visit: Payer: Self-pay

## 2020-10-26 DIAGNOSIS — R0600 Dyspnea, unspecified: Secondary | ICD-10-CM

## 2020-10-26 DIAGNOSIS — J301 Allergic rhinitis due to pollen: Secondary | ICD-10-CM

## 2020-10-26 DIAGNOSIS — J449 Chronic obstructive pulmonary disease, unspecified: Secondary | ICD-10-CM

## 2020-10-26 DIAGNOSIS — R0609 Other forms of dyspnea: Secondary | ICD-10-CM

## 2020-10-26 DIAGNOSIS — J4489 Other specified chronic obstructive pulmonary disease: Secondary | ICD-10-CM

## 2020-10-26 MED ORDER — ALBUTEROL SULFATE (2.5 MG/3ML) 0.083% IN NEBU: 2.5000 mg | INHALATION_SOLUTION | Freq: Four times a day (QID) | RESPIRATORY_TRACT | 12 refills | Status: AC | PRN

## 2020-10-26 NOTE — Assessment & Plan Note (Signed)
Please continue Singulair and fluticasone nasal spray as you have been using them.

## 2020-10-26 NOTE — Assessment & Plan Note (Addendum)
Please continue Stiolto 2 puffs once daily. Keep albuterol available to use either 1 nebulizer treatment or 2 puffs up to every 4 hours if needed for shortness of breath, chest tightness, wheezing. COVID-19 vaccine is up-to-date Follow with Dr Lamonte Sakai in 6 months or sooner if you have any problems

## 2020-10-26 NOTE — Assessment & Plan Note (Signed)
Has improved since her volume status has been optimized.  She is about to start peritoneal dialysis, hopefully will be able to maintain adequate dry weight

## 2020-10-26 NOTE — Addendum Note (Signed)
Addended by: Gavin Potters R on: 10/26/2020 03:05 PM   Modules accepted: Orders

## 2020-10-26 NOTE — Patient Instructions (Addendum)
Please continue Stiolto 2 puffs once daily. Keep albuterol available to use either 1 nebulizer treatment or 2 puffs up to every 4 hours if needed for shortness of breath, chest tightness, wheezing. COVID-19 vaccine is up-to-date Please continue Singulair and fluticasone nasal spray as you have been using them. Follow with Dr Lamonte Sakai in 6 months or sooner if you have any problems

## 2020-10-26 NOTE — Progress Notes (Signed)
Subjective:    Patient ID: Joyce [name], female    DOB: February 24, 1941, 80 y.o.   MRN: [id number]   Shortness of Breath Pertinent negatives include no ear pain, fever, headaches, leg swelling, rash, rhinorrhea, sore throat, vomiting or wheezing.   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  At her last visit she was experiencing more dyspnea.  She was back on her Stiolto after having this stopped briefly postop hip surgery.  She reports today that she was hospitalized again since last time, at [facility], for acute on chronic CHF. Remains on Stiolto. She was diuresed effectively, improved. Now she is having progressive dyspnea and increase LE edema. Minimal cough. She is about to start more PT for her stamina.  COVID vaccine and flu shot up to date.   ROV 10/26/20 --80 year old woman whom we follow for COPD/asthma with mixed obstruction and restriction on spirometry.  Past medical history also significant for CAD/CABG, hypertension with diastolic dysfunction and CHF on lisinopril, chronic kidney disease stage V, probably going to start peritoneal dialysis soon.  At her last visit last fall she was dealing with some volume overload and diastolic CHF, had been hospitalized for hip surgery and was starting PT.  She is managed on Stiolto, Singulair, fluticasone nasal spray.  She has albuterol and uses about few times a week. No flares. Has not haad COVID, has had her vaccines.   Reviewed Cardiology notes 10/11/20    Review of Systems  Constitutional: Negative for fever and unexpected weight change.  HENT: Negative for congestion, dental problem, ear pain, nosebleeds, postnasal drip, rhinorrhea, sinus pressure, sneezing, sore throat and trouble swallowing.   Eyes: Negative for redness and itching.  Respiratory: Positive for shortness of breath. Negative for cough, chest tightness and wheezing.    Cardiovascular: Negative for palpitations and leg swelling.  Gastrointestinal: Negative for nausea and vomiting.  Genitourinary: Negative for dysuria.  Musculoskeletal: Negative for joint swelling.  Skin: Negative for rash.  Neurological: Negative for headaches.  Hematological: Does not bruise/bleed easily.  Psychiatric/Behavioral: Negative for dysphoric mood. The patient is not nervous/anxious.        Objective:   Physical Exam Vitals:   10/26/20 1442  BP: 130/70  Pulse: (!) 56  Temp: 98.3 F (36.8 C)  TempSrc: Temporal  SpO2: 96%  Weight: 192 lb 3.2 oz (87.2 kg)  Height: 5\' 5"  (1.651 m)   Gen: Pleasant, well-nourished, in no distress,  normal affect  ENT: No lesions,  mouth clear,  oropharynx clear, no postnasal drip  Neck: No JVD, no Stridor  Lungs: No use of accessory muscles, Good air movement, few bibasilar insp crackles.   Cardiovascular: RRR, 3/6 syst M w intact S2  Musculoskeletal: No deformities, no cyanosis or clubbing  Neuro: alert, non focal  Skin: Warm, no lesions or rashes      Assessment & Plan:  COPD with asthma ([place]) Please continue Stiolto 2 puffs once daily. Keep albuterol available to use either 1 nebulizer treatment or 2 puffs up to every 4 hours if needed for shortness of breath, chest tightness, wheezing. COVID-19 vaccine is up-to-date Follow with Dr Lamonte Sakai in 6 months or sooner if you have any problems  Allergic rhinitis Please continue Singulair and fluticasone nasal spray as you have been using them.  Dyspnea Has improved since her volume status has been optimized.  She is about to start peritoneal dialysis, hopefully will be able to  maintain adequate dry weight  Baltazar Apo, MD, PhD 10/26/2020, 2:58 PM [facility] [phone] or if no answer [phone]

## 2020-11-03 ENCOUNTER — Telehealth: Payer: Self-pay | Admitting: Emergency Medicine

## 2020-11-03 NOTE — Telephone Encounter (Signed)
Spoke with the pt  She states that the [facility] in [place] is needing more information to fulfill her Neb machine order  I called them at [phone] and I was placed on hold for 12 min  Will call them back later or tomorrow 5/12

## 2020-11-04 NOTE — Telephone Encounter (Signed)
I first called [facility] in [place] and spoke with Abby who said the nebulizer order was sent to the [place] office. I then called [facility] office and spoke with Juliann Pulse who said they did have the order but should have went to [facility] and put me on hold to check it out. She then said it was to be filled by them and they have the CMN and will get the machine to the patient tomorrow. I then called the patient and notified them they should get the machine tomm and call the office if not. Patient verbalized understanding, nothing further needed.

## 2020-11-05 ENCOUNTER — Telehealth: Payer: Self-pay | Admitting: Emergency Medicine

## 2020-11-05 DIAGNOSIS — J449 Chronic obstructive pulmonary disease, unspecified: Secondary | ICD-10-CM

## 2020-11-05 DIAGNOSIS — R0609 Other forms of dyspnea: Secondary | ICD-10-CM

## 2020-11-05 DIAGNOSIS — R0600 Dyspnea, unspecified: Secondary | ICD-10-CM

## 2020-11-05 MED ORDER — ALBUTEROL SULFATE HFA 108 (90 BASE) MCG/ACT IN AERS: 2.0000 | INHALATION_SPRAY | RESPIRATORY_TRACT | 5 refills | Status: DC | PRN

## 2020-11-05 NOTE — Telephone Encounter (Signed)
Called and spoke with patient who states that she spoke with [facility] who still has not received her perscription for the nebulizer. She would like for it to be sent to the [place] office (phone # [phone]) NOT to [place]. Advised patient that I would have to call them on Monday to get fax number or see if they have a pharmacy to send it to. She expressed understanding. She also stated that she needs a refill on her rescue inhaler. She verified preferred pharmacy. RX for rescue inhaler sent.  Need to call [facility] at number listed above Monday 11/08/20

## 2020-11-05 NOTE — Telephone Encounter (Signed)
Pt states that she spoke with [facility] who still has not received her perscription for the nebulizer. She also would like for it to be sent to the [place] office [phone]) NOT to [facility]. Pt also wants to know if [facility] has called for her prescription albuterol .

## 2020-11-08 NOTE — Telephone Encounter (Signed)
Patient would like nebulizer order to be sent to the [facility] in [place]. [facility] note updated and order placed. Please send order to this [facility] per patient.  [facility] Address [address] [place], [address] Phone Number [phone] Fax Number [phone]

## 2020-11-08 NOTE — Telephone Encounter (Signed)
I called [facility] and spoke to [place].  She states they have the original order that was sent on 5/4 and it will be sent to [facility] office - [place] is the same office - they are waiting on RB to sign CMN.  I checked log and CMN was received in our office on 5/10.  I checked with Vallarie Mare and she has not received his folder back yet. Will route to [place] to make aware.

## 2020-11-08 NOTE — Telephone Encounter (Signed)
I sent the original nebulizer order to [facility] on 5/4 and it should have been forwarded to office in [place].  I grabbed the order that was placed today also.  I just tried to call local office and they are at lunch.  I will call back after lunch and check status of original order.

## 2020-11-10 NOTE — Telephone Encounter (Signed)
Have I returned this CMN Vallarie Mare?

## 2020-11-10 NOTE — Telephone Encounter (Signed)
Yeah it was faxed back on 11/09/20

## 2020-11-11 NOTE — Telephone Encounter (Signed)
Attempted to call [facility] and was placed on hold for over 10 mins. CMN was faxed. Nothing further needed at this time.

## 2020-11-12 ENCOUNTER — Telehealth: Payer: Self-pay

## 2020-11-12 NOTE — Telephone Encounter (Signed)
Spoke to pt regarding her Albuterol HFA inhaler. Pt states until her insurance is changed back to medicare she would like to hold off on the medication, she stated she will give [place] a call back once her insurance changes.

## 2020-11-18 ENCOUNTER — Emergency Department (HOSPITAL_COMMUNITY): Payer: Medicare Other

## 2020-11-18 ENCOUNTER — Observation Stay (HOSPITAL_COMMUNITY): Payer: Medicare Other

## 2020-11-18 ENCOUNTER — Encounter (HOSPITAL_COMMUNITY): Payer: Self-pay | Admitting: *Deleted

## 2020-11-18 ENCOUNTER — Other Ambulatory Visit: Payer: Self-pay

## 2020-11-18 ENCOUNTER — Inpatient Hospital Stay (HOSPITAL_COMMUNITY)
Admission: EM | Admit: 2020-11-18 | Discharge: 2020-11-24 | DRG: 640 | Disposition: A | Discharge status: Home / Self Care | Payer: Medicare Other | Attending: Internal Medicine | Admitting: Internal Medicine

## 2020-11-18 DIAGNOSIS — Z7902 Long term (current) use of antithrombotics/antiplatelets: Secondary | ICD-10-CM

## 2020-11-18 DIAGNOSIS — E8770 Fluid overload, unspecified: Secondary | ICD-10-CM | POA: Diagnosis not present

## 2020-11-18 DIAGNOSIS — F419 Anxiety disorder, unspecified: Secondary | ICD-10-CM | POA: Diagnosis present

## 2020-11-18 DIAGNOSIS — Z794 Long term (current) use of insulin: Secondary | ICD-10-CM

## 2020-11-18 DIAGNOSIS — Z981 Arthrodesis status: Secondary | ICD-10-CM

## 2020-11-18 DIAGNOSIS — D631 Anemia in chronic kidney disease: Secondary | ICD-10-CM | POA: Diagnosis present

## 2020-11-18 DIAGNOSIS — N2581 Secondary hyperparathyroidism of renal origin: Secondary | ICD-10-CM | POA: Diagnosis present

## 2020-11-18 DIAGNOSIS — I5032 Chronic diastolic (congestive) heart failure: Secondary | ICD-10-CM

## 2020-11-18 DIAGNOSIS — N185 Chronic kidney disease, stage 5: Secondary | ICD-10-CM | POA: Diagnosis present

## 2020-11-18 DIAGNOSIS — E872 Acidosis: Secondary | ICD-10-CM | POA: Diagnosis present

## 2020-11-18 DIAGNOSIS — Z96641 Presence of right artificial hip joint: Secondary | ICD-10-CM | POA: Diagnosis present

## 2020-11-18 DIAGNOSIS — E669 Obesity, unspecified: Secondary | ICD-10-CM | POA: Diagnosis present

## 2020-11-18 DIAGNOSIS — I5033 Acute on chronic diastolic (congestive) heart failure: Secondary | ICD-10-CM | POA: Diagnosis present

## 2020-11-18 DIAGNOSIS — J449 Chronic obstructive pulmonary disease, unspecified: Secondary | ICD-10-CM | POA: Diagnosis present

## 2020-11-18 DIAGNOSIS — J4489 Other specified chronic obstructive pulmonary disease: Secondary | ICD-10-CM | POA: Diagnosis present

## 2020-11-18 DIAGNOSIS — Z8249 Family history of ischemic heart disease and other diseases of the circulatory system: Secondary | ICD-10-CM

## 2020-11-18 DIAGNOSIS — N189 Chronic kidney disease, unspecified: Principal | ICD-10-CM

## 2020-11-18 DIAGNOSIS — E1169 Type 2 diabetes mellitus with other specified complication: Secondary | ICD-10-CM | POA: Diagnosis present

## 2020-11-18 DIAGNOSIS — Z955 Presence of coronary angioplasty implant and graft: Secondary | ICD-10-CM

## 2020-11-18 DIAGNOSIS — M797 Fibromyalgia: Secondary | ICD-10-CM | POA: Diagnosis present

## 2020-11-18 DIAGNOSIS — Z6831 Body mass index (BMI) 31.0-31.9, adult: Secondary | ICD-10-CM

## 2020-11-18 DIAGNOSIS — Z79899 Other long term (current) drug therapy: Secondary | ICD-10-CM

## 2020-11-18 DIAGNOSIS — I2510 Atherosclerotic heart disease of native coronary artery without angina pectoris: Secondary | ICD-10-CM | POA: Diagnosis present

## 2020-11-18 DIAGNOSIS — N179 Acute kidney failure, unspecified: Secondary | ICD-10-CM | POA: Diagnosis present

## 2020-11-18 DIAGNOSIS — Z833 Family history of diabetes mellitus: Secondary | ICD-10-CM

## 2020-11-18 DIAGNOSIS — R34 Anuria and oliguria: Secondary | ICD-10-CM

## 2020-11-18 DIAGNOSIS — Z90710 Acquired absence of both cervix and uterus: Secondary | ICD-10-CM

## 2020-11-18 DIAGNOSIS — E785 Hyperlipidemia, unspecified: Secondary | ICD-10-CM | POA: Diagnosis present

## 2020-11-18 DIAGNOSIS — I132 Hypertensive heart and chronic kidney disease with heart failure and with stage 5 chronic kidney disease, or end stage renal disease: Secondary | ICD-10-CM | POA: Diagnosis present

## 2020-11-18 DIAGNOSIS — Z66 Do not resuscitate: Secondary | ICD-10-CM | POA: Diagnosis present

## 2020-11-18 DIAGNOSIS — E1165 Type 2 diabetes mellitus with hyperglycemia: Secondary | ICD-10-CM | POA: Diagnosis present

## 2020-11-18 DIAGNOSIS — E1122 Type 2 diabetes mellitus with diabetic chronic kidney disease: Secondary | ICD-10-CM | POA: Diagnosis present

## 2020-11-18 DIAGNOSIS — Z20822 Contact with and (suspected) exposure to covid-19: Secondary | ICD-10-CM | POA: Diagnosis present

## 2020-11-18 DIAGNOSIS — I252 Old myocardial infarction: Secondary | ICD-10-CM

## 2020-11-18 DIAGNOSIS — I509 Heart failure, unspecified: Secondary | ICD-10-CM

## 2020-11-18 DIAGNOSIS — G8929 Other chronic pain: Secondary | ICD-10-CM | POA: Diagnosis present

## 2020-11-18 DIAGNOSIS — Z951 Presence of aortocoronary bypass graft: Secondary | ICD-10-CM

## 2020-11-18 DIAGNOSIS — Z9049 Acquired absence of other specified parts of digestive tract: Secondary | ICD-10-CM

## 2020-11-18 DIAGNOSIS — I10 Essential (primary) hypertension: Secondary | ICD-10-CM | POA: Diagnosis present

## 2020-11-18 LAB — BRAIN NATRIURETIC PEPTIDE: B Natriuretic Peptide: 1340.4 pg/mL — ABNORMAL HIGH (ref 0.0–100.0)

## 2020-11-18 LAB — TROPONIN I (HIGH SENSITIVITY)
Troponin I (High Sensitivity): 18 ng/L — ABNORMAL HIGH (ref <18)
Troponin I (High Sensitivity): 18 ng/L — ABNORMAL HIGH (ref <18)

## 2020-11-18 LAB — COMPREHENSIVE METABOLIC PANEL
ALT: 29 U/L (ref 0–44)
AST: 25 U/L (ref 15–41)
Albumin: 3.2 g/dL — ABNORMAL LOW (ref 3.5–5.0)
Alkaline Phosphatase: 90 U/L (ref 38–126)
Anion gap: 10 (ref 5–15)
BUN: 61 mg/dL — ABNORMAL HIGH (ref 8–23)
CO2: 20 mmol/L — ABNORMAL LOW (ref 22–32)
Calcium: 8.6 mg/dL — ABNORMAL LOW (ref 8.9–10.3)
Chloride: 109 mmol/L (ref 98–111)
Creatinine, Ser: 3.41 mg/dL — ABNORMAL HIGH (ref 0.44–1.00)
GFR, Estimated: 13 mL/min — ABNORMAL LOW (ref >60)
Glucose, Bld: 169 mg/dL — ABNORMAL HIGH (ref 70–99)
Potassium: 4.6 mmol/L (ref 3.5–5.1)
Sodium: 139 mmol/L (ref 135–145)
Total Bilirubin: 0.9 mg/dL (ref 0.3–1.2)
Total Protein: 5.7 g/dL — ABNORMAL LOW (ref 6.5–8.1)

## 2020-11-18 LAB — CBC WITH DIFFERENTIAL/PLATELET
Abs Immature Granulocytes: 0.01 10*3/uL (ref 0.00–0.07)
Basophils Absolute: 0 10*3/uL (ref 0.0–0.1)
Basophils Relative: 0 %
Eosinophils Absolute: 0.1 10*3/uL (ref 0.0–0.5)
Eosinophils Relative: 1 %
HCT: 28.8 % — ABNORMAL LOW (ref 36.0–46.0)
Hemoglobin: 8.8 g/dL — ABNORMAL LOW (ref 12.0–15.0)
Immature Granulocytes: 0 %
Lymphocytes Relative: 16 %
Lymphs Abs: 0.8 10*3/uL (ref 0.7–4.0)
MCH: 29.1 pg (ref 26.0–34.0)
MCHC: 30.6 g/dL (ref 30.0–36.0)
MCV: 95.4 fL (ref 80.0–100.0)
Monocytes Absolute: 0.3 10*3/uL (ref 0.1–1.0)
Monocytes Relative: 6 %
Neutro Abs: 4 10*3/uL (ref 1.7–7.7)
Neutrophils Relative %: 77 %
Platelets: 170 10*3/uL (ref 150–400)
RBC: 3.02 MIL/uL — ABNORMAL LOW (ref 3.87–5.11)
RDW: 13.3 % (ref 11.5–15.5)
WBC: 5.2 10*3/uL (ref 4.0–10.5)
nRBC: 0 % (ref 0.0–0.2)

## 2020-11-18 LAB — URINALYSIS, ROUTINE W REFLEX MICROSCOPIC
Bilirubin Urine: NEGATIVE
Glucose, UA: 50 mg/dL — AB
Hgb urine dipstick: NEGATIVE
Ketones, ur: NEGATIVE mg/dL
Leukocytes,Ua: NEGATIVE
Nitrite: NEGATIVE
Protein, ur: >=300 mg/dL — AB
Specific Gravity, Urine: 1.011 (ref 1.005–1.030)
pH: 5 (ref 5.0–8.0)

## 2020-11-18 LAB — RESP PANEL BY RT-PCR (FLU A&B, COVID) ARPGX2
Influenza A by PCR: NEGATIVE
Influenza B by PCR: NEGATIVE
SARS Coronavirus 2 by RT PCR: NEGATIVE

## 2020-11-18 LAB — GLUCOSE, CAPILLARY
Glucose-Capillary: 106 mg/dL — ABNORMAL HIGH (ref 70–99)
Glucose-Capillary: 169 mg/dL — ABNORMAL HIGH (ref 70–99)

## 2020-11-18 MED ORDER — DOCUSATE SODIUM 283 MG RE ENEM
1.0000 | ENEMA | RECTAL | Status: DC | PRN
  Filled 2020-11-18: qty 1

## 2020-11-18 MED ORDER — BISOPROLOL FUMARATE 5 MG PO TABS
5.0000 mg | ORAL_TABLET | Freq: Every day | ORAL | Status: DC
  Administered 2020-11-19 – 2020-11-24 (×6): 5 mg via ORAL
  Filled 2020-11-18 (×6): qty 1

## 2020-11-18 MED ORDER — HYDRALAZINE HCL 20 MG/ML IJ SOLN
5.0000 mg | INTRAMUSCULAR | Status: DC | PRN
  Administered 2020-11-19 – 2020-11-23 (×7): 5 mg via INTRAVENOUS
  Filled 2020-11-18 (×7): qty 1

## 2020-11-18 MED ORDER — DARBEPOETIN ALFA 150 MCG/0.3ML IJ SOSY
150.0000 ug | PREFILLED_SYRINGE | Freq: Once | INTRAMUSCULAR | Status: AC
  Administered 2020-11-18: 150 ug via SUBCUTANEOUS
  Filled 2020-11-18 (×2): qty 0.3

## 2020-11-18 MED ORDER — HYDROMORPHONE HCL 2 MG PO TABS: 2.0000 mg | ORAL_TABLET | ORAL | Status: DC | PRN

## 2020-11-18 MED ORDER — ALBUTEROL SULFATE (2.5 MG/3ML) 0.083% IN NEBU
2.5000 mg | INHALATION_SOLUTION | Freq: Four times a day (QID) | RESPIRATORY_TRACT | Status: DC | PRN
  Administered 2020-11-18: 2.5 mg via RESPIRATORY_TRACT
  Filled 2020-11-18: qty 3

## 2020-11-18 MED ORDER — ZOLPIDEM TARTRATE 5 MG PO TABS: 5.0000 mg | ORAL_TABLET | Freq: Every evening | ORAL | Status: DC | PRN

## 2020-11-18 MED ORDER — FUROSEMIDE 10 MG/ML IJ SOLN
80.0000 mg | Freq: Two times a day (BID) | INTRAMUSCULAR | Status: DC
  Administered 2020-11-18 – 2020-11-19 (×3): 80 mg via INTRAVENOUS
  Filled 2020-11-18 (×3): qty 8

## 2020-11-18 MED ORDER — ACETAMINOPHEN 650 MG RE SUPP: 650.0000 mg | Freq: Four times a day (QID) | RECTAL | Status: DC | PRN

## 2020-11-18 MED ORDER — CALCIUM CARBONATE ANTACID 1250 MG/5ML PO SUSP
500.0000 mg | Freq: Four times a day (QID) | ORAL | Status: DC | PRN
  Filled 2020-11-18: qty 5

## 2020-11-18 MED ORDER — HYDROXYZINE HCL 25 MG PO TABS: 25.0000 mg | ORAL_TABLET | Freq: Three times a day (TID) | ORAL | Status: DC | PRN

## 2020-11-18 MED ORDER — NEPRO/CARBSTEADY PO LIQD: 237.0000 mL | Freq: Three times a day (TID) | ORAL | Status: DC | PRN

## 2020-11-18 MED ORDER — VALACYCLOVIR HCL 500 MG PO TABS
1000.0000 mg | ORAL_TABLET | Freq: Two times a day (BID) | ORAL | Status: DC
  Administered 2020-11-18 – 2020-11-24 (×12): 1000 mg via ORAL
  Filled 2020-11-18 (×13): qty 2

## 2020-11-18 MED ORDER — HEPARIN SODIUM (PORCINE) 5000 UNIT/ML IJ SOLN
5000.0000 [IU] | Freq: Three times a day (TID) | INTRAMUSCULAR | Status: DC
  Administered 2020-11-18 – 2020-11-24 (×16): 5000 [IU] via SUBCUTANEOUS
  Filled 2020-11-18 (×17): qty 1

## 2020-11-18 MED ORDER — INSULIN GLARGINE 100 UNIT/ML ~~LOC~~ SOLN
15.0000 [IU] | Freq: Every day | SUBCUTANEOUS | Status: DC
  Filled 2020-11-18: qty 0.15

## 2020-11-18 MED ORDER — SODIUM BICARBONATE 650 MG PO TABS
650.0000 mg | ORAL_TABLET | Freq: Two times a day (BID) | ORAL | Status: DC
  Administered 2020-11-18 – 2020-11-24 (×12): 650 mg via ORAL
  Filled 2020-11-18 (×12): qty 1

## 2020-11-18 MED ORDER — TRAMADOL HCL 50 MG PO TABS: 100.0000 mg | ORAL_TABLET | Freq: Two times a day (BID) | ORAL | Status: DC | PRN

## 2020-11-18 MED ORDER — ACETAMINOPHEN 325 MG PO TABS: 650.0000 mg | ORAL_TABLET | Freq: Four times a day (QID) | ORAL | Status: DC | PRN

## 2020-11-18 MED ORDER — CLOPIDOGREL BISULFATE 75 MG PO TABS
75.0000 mg | ORAL_TABLET | Freq: Every day | ORAL | Status: DC
  Administered 2020-11-19 – 2020-11-24 (×6): 75 mg via ORAL
  Filled 2020-11-18 (×6): qty 1

## 2020-11-18 MED ORDER — CALCITRIOL 0.25 MCG PO CAPS
0.2500 ug | ORAL_CAPSULE | Freq: Every day | ORAL | Status: DC
  Administered 2020-11-19 – 2020-11-24 (×6): 0.25 ug via ORAL
  Filled 2020-11-18 (×6): qty 1

## 2020-11-18 MED ORDER — ISOSORBIDE MONONITRATE ER 60 MG PO TB24
60.0000 mg | ORAL_TABLET | Freq: Every day | ORAL | Status: DC
  Administered 2020-11-19 – 2020-11-24 (×6): 60 mg via ORAL
  Filled 2020-11-18 (×6): qty 1

## 2020-11-18 MED ORDER — FUROSEMIDE 80 MG PO TABS: 80.0000 mg | ORAL_TABLET | Freq: Two times a day (BID) | ORAL | Status: DC

## 2020-11-18 MED ORDER — HYDRALAZINE HCL 50 MG PO TABS
100.0000 mg | ORAL_TABLET | Freq: Three times a day (TID) | ORAL | Status: DC
  Administered 2020-11-18 – 2020-11-24 (×17): 100 mg via ORAL
  Filled 2020-11-18 (×17): qty 2

## 2020-11-18 MED ORDER — CAMPHOR-MENTHOL 0.5-0.5 % EX LOTN: 1.0000 "application " | TOPICAL_LOTION | Freq: Three times a day (TID) | CUTANEOUS | Status: DC | PRN

## 2020-11-18 MED ORDER — PRIMIDONE 50 MG PO TABS
100.0000 mg | ORAL_TABLET | Freq: Every day | ORAL | Status: DC
  Administered 2020-11-19 – 2020-11-24 (×6): 100 mg via ORAL
  Filled 2020-11-18 (×6): qty 2

## 2020-11-18 MED ORDER — MONTELUKAST SODIUM 10 MG PO TABS
10.0000 mg | ORAL_TABLET | Freq: Every day | ORAL | Status: DC
  Administered 2020-11-18 – 2020-11-23 (×6): 10 mg via ORAL
  Filled 2020-11-18 (×6): qty 1

## 2020-11-18 MED ORDER — ATORVASTATIN CALCIUM 40 MG PO TABS
40.0000 mg | ORAL_TABLET | Freq: Every evening | ORAL | Status: DC
  Administered 2020-11-18 – 2020-11-23 (×6): 40 mg via ORAL
  Filled 2020-11-18 (×6): qty 1

## 2020-11-18 MED ORDER — FLUTICASONE PROPIONATE 50 MCG/ACT NA SUSP
1.0000 | Freq: Every day | NASAL | Status: DC
  Administered 2020-11-19 – 2020-11-22 (×2): 1 via NASAL
  Filled 2020-11-18: qty 16

## 2020-11-18 MED ORDER — SODIUM CHLORIDE 0.9% FLUSH
3.0000 mL | Freq: Two times a day (BID) | INTRAVENOUS | Status: DC
  Administered 2020-11-18 – 2020-11-24 (×12): 3 mL via INTRAVENOUS

## 2020-11-18 MED ORDER — INSULIN ASPART 100 UNIT/ML IJ SOLN
0.0000 [IU] | Freq: Three times a day (TID) | INTRAMUSCULAR | Status: DC
  Administered 2020-11-19: 1 [IU] via SUBCUTANEOUS
  Administered 2020-11-19: 2 [IU] via SUBCUTANEOUS
  Administered 2020-11-20: 1 [IU] via SUBCUTANEOUS
  Administered 2020-11-21: 2 [IU] via SUBCUTANEOUS
  Administered 2020-11-22 (×2): 1 [IU] via SUBCUTANEOUS
  Administered 2020-11-23 – 2020-11-24 (×3): 2 [IU] via SUBCUTANEOUS
  Administered 2020-11-24: 1 [IU] via SUBCUTANEOUS

## 2020-11-18 MED ORDER — ONDANSETRON HCL 4 MG/2ML IJ SOLN
4.0000 mg | Freq: Four times a day (QID) | INTRAMUSCULAR | Status: DC | PRN
  Administered 2020-11-19: 4 mg via INTRAVENOUS
  Filled 2020-11-18: qty 2

## 2020-11-18 MED ORDER — BUSPIRONE HCL 5 MG PO TABS: 5.0000 mg | ORAL_TABLET | Freq: Every day | ORAL | Status: DC | PRN

## 2020-11-18 MED ORDER — ROPINIROLE HCL 0.5 MG PO TABS
1.0000 mg | ORAL_TABLET | Freq: Three times a day (TID) | ORAL | Status: DC | PRN
  Administered 2020-11-21 – 2020-11-23 (×2): 1 mg via ORAL
  Filled 2020-11-18 (×4): qty 2

## 2020-11-18 MED ORDER — ONDANSETRON HCL 4 MG PO TABS: 4.0000 mg | ORAL_TABLET | Freq: Four times a day (QID) | ORAL | Status: DC | PRN

## 2020-11-18 MED ORDER — INSULIN GLARGINE 100 UNIT/ML ~~LOC~~ SOLN
15.0000 [IU] | Freq: Every day | SUBCUTANEOUS | Status: DC
  Administered 2020-11-18 – 2020-11-23 (×6): 15 [IU] via SUBCUTANEOUS
  Filled 2020-11-18 (×7): qty 0.15

## 2020-11-18 MED ORDER — SORBITOL 70 % SOLN
30.0000 mL | Status: DC | PRN
  Filled 2020-11-18: qty 30

## 2020-11-18 NOTE — ED Provider Notes (Signed)
Emergency Medicine Provider Triage Evaluation Note  TATIANA COURTER , a 80 y.o. female  was evaluated in triage.  Pt complains of sob that started a few days ago. Reports ble edema as well. Currently dx with stage 5 ckd and is preparing to start peritoneal dialysis but has not initiated yet. Reports she was told by her nephrologist to take 2 fluid pills today but she has hd no urine output since doing so.  Review of Systems  Positive: Sob, ble edema Negative: Chest pain, cough  Physical Exam  BP (!) 192/58 (BP Location: Right Arm)   Pulse (!) 59   Temp 98.2 F (36.8 C) (Oral)   Resp 15   SpO2 97%  Gen:   Awake, no distress   Resp:  Normal effort  MSK:   Moves extremities without difficulty  Other:  Bibasilar rales, ble edema  Medical Decision Making  Medically screening exam initiated at 12:26 PM.  Appropriate orders placed.  ERZA MOTHERSHEAD was informed that the remainder of the evaluation will be completed by another provider, this initial triage assessment does not replace that evaluation, and the importance of remaining in the ED until their evaluation is complete.     Bishop Dublin 11/18/20 1226    Carmin Muskrat, MD 11/18/20 [phone]

## 2020-11-18 NOTE — ED Provider Notes (Signed)
[place] EMERGENCY DEPARTMENT Provider Note   CSN: [id number] Arrival date & time: 11/18/20  1156     History Chief Complaint  Patient presents with  . Shortness of Breath    Joyce [name] is a 80 y.o. female.  HPI Patient presents with shortness of breath and fluid collections.  Has a history of chronic kidney disease.  Reportedly is due to start peritoneal dialysis but does not have access for it yet.  Gets managed in [place].  Is on Bumex.  Minimal urine output for the last couple days.  Reportedly her nephrologist had discussed about a temporary dialysis catheter so she can do regular dialysis while awaiting peritoneal dialysis.  No fevers.     Past Medical History:  Diagnosis Date  . Anemia   . Arthritis   . Asthma   . Bilateral pleural effusion    Postoperative, status post Pleurx catheter and talc treatment - Dr. Prescott Gum  . 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 Chronic kidney disease (CKD), stage III (moderate) (HCC)   . Chronic lower back pain   . COPD (chronic obstructive pulmonary disease) ([place])   . Essential hypertension   . Fibromyalgia   . History of pneumonia   . Hyperlipidemia   . Myocardial infarction ([place])   . Type II diabetes mellitus [facility])     Patient Active Problem List   Diagnosis Date Noted  . Volume overload 11/18/2020  . Acute diastolic CHF (congestive heart failure) ([place]) 05/04/2020  . CAD S/P percutaneous coronary angioplasty 05/04/2020  . Displacement of lumbar intervertebral disc without myelopathy 02/03/2020  . Fatty liver 02/03/2020  . Status post total hip replacement, right 02/02/2020  . 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 Chronic constipation 05/16/2019  . Fibromyalgia 08/08/2018  . 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 Acute on chronic blood loss anemia 05/23/2018  . Coronary artery disease  involving native coronary artery of native heart with unstable angina pectoris ([place]) 05/23/2018  . S/P right coronary artery (RCA) stent placement 05/23/2018  . COPD with asthma ([place]) 07/17/2017  . Obesity, Class I, BMI 30-34.9 11/17/2015  . Allergic rhinitis 08/13/2014  . Bilateral pleural effusion 08/04/2014  . Dyspnea   . Leukopenia 07/28/2014  . Normocytic anemia 07/27/2014  . Exudative pleural effusion   . Diastolic CHF, chronic ([employer]) 07/26/2014  . 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 NSTEMI (non-ST elevated myocardial infarction) ([place]) 06/11/2014  . Chronic kidney disease (CKD), stage III (moderate) ([place]) 06/11/2014  . Essential hypertension 06/11/2014  . Diabetes mellitus type 2 in obese ([place]) 06/11/2014  . Controlled substance agreement signed 11/29/2012  . 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 Essential tremor 09/06/2011  . Cervical disc herniation 09/20/2010  . Depression 01/02/2003    Past Surgical History:  Procedure Laterality Date  . ABDOMINAL HYSTERECTOMY  1980's  . ANTERIOR CERVICAL DECOMP/DISCECTOMY FUSION  2000's  . APPENDECTOMY  1970's  . BACK SURGERY    . BIOPSY  08/28/2019   Procedure: BIOPSY;  Surgeon: Rogene Houston, MD;  [facility];  Service: Endoscopy;;  duodenal  . CHEST TUBE INSERTION Left 08/07/2014   Procedure: INSERTION PLEURAL DRAINAGE CATHETER;  Surgeon: Ivin Poot, MD;  Location: [place];  Service: Thoracic;  Laterality: Left;  . CHEST TUBE INSERTION Right 08/12/2014  Procedure: INSERTION PLEURAL DRAINAGE CATHETER;  Surgeon: Ivin Poot, MD;  Location: [place];  Service: Thoracic;  Laterality: Right;  . CHOLECYSTECTOMY  ~ 2005  . COLONOSCOPY N/A 08/28/2019   Procedure: COLONOSCOPY;  Surgeon: Rogene Houston, MD;  [facility];  Service: Endoscopy;  Laterality: N/A;  100  . CORONARY ARTERY BYPASS GRAFT N/A 06/15/2014   Procedure: CORONARY ARTERY BYPASS GRAFTING (CABG);  Surgeon: Ivin Poot, MD;   Location: [place];  Service: Open Heart Surgery;  Laterality: N/A;  . CORONARY ATHERECTOMY N/A 05/22/2018   Procedure: CORONARY ATHERECTOMY;  Surgeon: Belva Crome, MD;  Location: [place] CV LAB;  Service: Cardiovascular;  Laterality: N/A;  . CORONARY STENT INTERVENTION N/A 05/22/2018   Procedure: CORONARY STENT INTERVENTION;  Surgeon: Belva Crome, MD;  Location: [place] CV LAB;  Service: Cardiovascular;  Laterality: N/A;  rca   . ESOPHAGOGASTRODUODENOSCOPY N/A 08/28/2019   Procedure: ESOPHAGOGASTRODUODENOSCOPY (EGD);  Surgeon: Rogene Houston, MD;  [facility];  Service: Endoscopy;  Laterality: N/A;  . INTRAOPERATIVE TRANSESOPHAGEAL ECHOCARDIOGRAM N/A 06/15/2014   Procedure: INTRAOPERATIVE TRANSESOPHAGEAL ECHOCARDIOGRAM;  Surgeon: Ivin Poot, MD;  Location: [place];  Service: Open Heart Surgery;  Laterality: N/A;  . LEFT HEART CATH AND CORS/GRAFTS ANGIOGRAPHY N/A 05/20/2018   Procedure: LEFT HEART CATH AND CORS/GRAFTS ANGIOGRAPHY;  Surgeon: Lorretta Harp, MD;  Location: [place] CV LAB;  Service: Cardiovascular;  Laterality: N/A;  . LEFT HEART CATHETERIZATION WITH CORONARY ANGIOGRAM N/A 06/11/2014   Procedure: LEFT HEART CATHETERIZATION WITH CORONARY ANGIOGRAM;  Surgeon: Blane Ohara, MD;  Location[facility] CATH LAB;  Service: Cardiovascular;  Laterality: N/A;  . LUMBAR DISC SURGERY  1980's X 1; 1990's X  2000's X 1  . POLYPECTOMY  08/28/2019   Procedure: POLYPECTOMY;  Surgeon: Rogene Houston, MD;  [facility];  Service: Endoscopy;;  . REMOVAL OF PLEURAL DRAINAGE CATHETER Bilateral 02/11/2015   Procedure: REMOVAL OF PLEURAL DRAINAGE CATHETER;  Surgeon: Ivin Poot, MD;  Location: [place];  Service: Thoracic;  Laterality: Bilateral;  . TALC PLEURODESIS Bilateral 02/11/2015   Procedure: Pietro Cassis;  Surgeon: Ivin Poot, MD;  Location: [place];  Service: Thoracic;  Laterality: Bilateral;  . TONSILLECTOMY  ~ 1950  . TOTAL HIP ARTHROPLASTY Right  02/02/2020   Procedure: RIGHT TOTAL HIP ARTHROPLASTY ANTERIOR APPROACH;  Surgeon: Frederik Pear, MD;  Location: [facility];  Service: Orthopedics;  Laterality: Right;     OB History   No obstetric history on file.     Family History  Problem Relation Age of Onset  . Stroke Father   . Diabetes Mother   . Hypertension Mother   . Alcoholism Brother   . Gout Brother   . Arthritis Brother   . Alcoholism Brother   . Heart disease Sister   . Heart attack Sister   . Cancer Sister        hodkins lymphoma  . CAD Paternal Grandmother        heart attack  . CAD Paternal Uncle        heart attack  . Heart attack Paternal Grandfather   . Osteoporosis Sister     Social History   Tobacco Use  . Smoking status: Never Smoker  . Smokeless tobacco: Never Used  Vaping Use  . Vaping Use: Never used  Substance Use Topics  . Alcohol use: No    Alcohol/week: 0.0 standard drinks  . Drug use: No    Home  Medications Prior to Admission medications   Medication Sig Start Date End Date Taking? Authorizing Provider  ACCU-CHEK AVIVA PLUS test strip Inject 1 strip as directed 4 (four) times daily. 11/16/14   [provider]  albuterol (PROVENTIL) (2.5 MG/3ML) 0.083% nebulizer solution Take 3 mLs (2.5 mg total) by nebulization every 6 (six) hours as needed for wheezing or shortness of breath. 10/26/20   Collene Gobble, MD  albuterol (VENTOLIN HFA) 108 (90 Base) MCG/ACT inhaler Inhale 2 puffs into the lungs every 4 (four) hours as needed for wheezing or shortness of breath. 11/05/20   Collene Gobble, MD  amLODipine (NORVASC) 5 MG tablet Take 5 mg by mouth daily.    [provider]  atorvastatin (LIPITOR) 40 MG tablet Take 40 mg by mouth every evening.  03/21/19   [provider]  bisoprolol (ZEBETA) 5 MG tablet Take 1 tablet (5 mg total) by mouth daily. 10/18/20   Satira Sark, MD  busPIRone (BUSPAR) 5 MG tablet Take 5 mg by mouth daily as needed (anxiety).    [provider]  calcitRIOL (ROCALTROL) 0.25 MCG capsule Take 0.25 mcg by mouth every Monday, Wednesday, and Friday.    [provider]  Cholecalciferol (VITAMIN D3) 2000 units TABS Take 4,000-6,000 Units by mouth See admin instructions. Take 4000 units by mouth every other day alternating with 6000 units on alternate days    [provider]  clopidogrel (PLAVIX) 75 MG tablet Take 1 tablet (75 mg total) by mouth daily with breakfast. 07/01/20   Satira Sark, MD  fluticasone (FLONASE) 50 MCG/ACT nasal spray Place 1 spray into both nostrils daily.  04/18/18   [provider]  furosemide (LASIX) 80 MG tablet Take 1 tablet (80 mg total) by mouth daily. 05/04/20   Erlene Quan, PA-C  hydrALAZINE (APRESOLINE) 100 MG tablet Take 100 mg by mouth 3 (three) times daily.    [provider]  HYDROmorphone (DILAUDID) 2 MG tablet Take 1 tablet (2 mg total) by mouth every 4 (four) hours as needed for severe pain. 02/02/20   Leighton Parody, PA-C  insulin glargine (LANTUS) 100 UNIT/ML injection Inject 40 Units into the skin at bedtime.     [provider]  insulin lispro (HUMALOG) 100 UNIT/ML KiwkPen Inject 8 Units into the skin 3 (three) times daily with meals.     [provider]  isosorbide mononitrate (IMDUR) 60 MG 24 hr tablet TAKE 1 TABLET BY MOUTH DAILY 09/03/20   Satira Sark, MD  lisinopril (ZESTRIL) 2.5 MG tablet Take 2.5 mg by mouth daily.    [provider]  montelukast (SINGULAIR) 10 MG tablet Take 10 mg by mouth at bedtime.    [provider]  Multiple Vitamin (MULTIVITAMIN WITH MINERALS) TABS tablet Take 1 tablet by mouth daily.    [provider]  naloxone [facility]) nasal spray 4 mg/0.1 mL Place 1 spray into the nose daily as needed (accidental overdose.). Use as directed.     [provider]  nitroGLYCERIN (NITROSTAT) 0.4 MG SL tablet Place 1 tablet (0.4 mg total) under the tongue every 5 (five) minutes x  3 doses as needed for chest pain. 05/26/18   Almyra Deforest, PA  polyethylene glycol (MIRALAX / GLYCOLAX) 17 g packet Take 17 g by mouth daily as needed for moderate constipation.    [provider]  primidone (MYSOLINE) 50 MG tablet Take 100 mg by mouth daily.  05/28/14   [provider]  Probiotic Product (PROBIOTIC DAILY PO) Take 1 capsule by mouth in the morning, at noon, and at bedtime.     [provider]  psyllium (METAMUCIL) 58.6 % packet Take by mouth daily.     [provider]  rOPINIRole (REQUIP) 1 MG tablet Take 1 mg by mouth 3 (three) times daily as needed (muscle spasms).    [provider]  sodium bicarbonate 650 MG tablet Take 650 mg by mouth daily. 09/28/20   [provider]  [place] 2.5-2.5 MCG/ACT AERS INHALE 2 PUFFS BY MOUTH DAILY 10/14/20   Collene Gobble, MD  tiZANidine (ZANAFLEX) 2 MG tablet Take 1 tablet (2 mg total) by mouth every 6 (six) hours as needed. 02/02/20   Leighton Parody, PA-C  traMADol (ULTRAM) 50 MG tablet Take 100 mg by mouth every 8 (eight) hours as needed for moderate pain.  03/24/19   [provider]    Allergies    Carvedilol, Codeine, Linaclotide, and Metoprolol  Review of Systems   Review of Systems  Constitutional: Negative for appetite change.  HENT: Negative for congestion.   Respiratory: Positive for shortness of breath.   Cardiovascular: Positive for leg swelling.  Gastrointestinal: Negative for abdominal pain.  Genitourinary: Negative for flank pain.  Musculoskeletal: Negative for back pain.  Skin: Negative for rash.  Neurological: Negative for weakness.  Psychiatric/Behavioral: Negative for confusion.    Physical Exam Updated Vital Signs BP (!) 185/61   Pulse (!) 59   Temp 98.2 F (36.8 C) (Oral)   Resp (!) 23   SpO2 95%   Physical Exam Vitals and nursing note reviewed.  HENT:     Head: Atraumatic.  Eyes:     Extraocular Movements: Extraocular movements intact.   Cardiovascular:     Rate and Rhythm: Normal rate.  Pulmonary:     Comments: Harsh breath sounds with some rales. Musculoskeletal:     Right lower leg: Edema present.     Left lower leg: Edema present.  Skin:    General: Skin is warm.     Capillary Refill: Capillary refill takes less than 2 seconds.  Neurological:     Mental Status: She is alert and oriented to person, place, and time.     ED Results / Procedures / Treatments   Labs (all labs ordered are listed, but only abnormal results are displayed) Labs Reviewed  CBC WITH DIFFERENTIAL/PLATELET - Abnormal; Notable for the following components:      Result Value   RBC 3.02 (*)    Hemoglobin 8.8 (*)    HCT 28.8 (*)    All other components within normal limits  COMPREHENSIVE METABOLIC PANEL - Abnormal; Notable for the following components:   CO2 20 (*)    Glucose, Bld 169 (*)    BUN 61 (*)    Creatinine, Ser 3.41 (*)    Calcium 8.6 (*)    Total Protein 5.7 (*)    Albumin 3.2 (*)    GFR, Estimated 13 (*)    All other components within normal limits  BRAIN NATRIURETIC PEPTIDE - Abnormal; Notable for the following components:   B Natriuretic Peptide 1,340.4 (*)    All other components within normal limits  TROPONIN I (HIGH SENSITIVITY) - Abnormal; Notable for the following components:   Troponin I (High Sensitivity) 18 (*)    All other components within normal limits  RESP PANEL BY RT-PCR (FLU A&B, COVID) ARPGX2  TROPONIN I (HIGH SENSITIVITY)    EKG EKG Interpretation  Date/Time:  Thursday Nov 18 2020 12:13:04 EDT Ventricular Rate:  59 PR Interval:  180 QRS Duration: 92 QT Interval:  452 QTC Calculation: 447 R Axis:   70 Text Interpretation: Sinus bradycardia Nonspecific ST and T wave abnormality Abnormal ECG Confirmed by Davonna Belling [phone]) on 11/18/2020 1:10:02 PM   Radiology DG Chest 2 View  Result Date: 11/18/2020 CLINICAL DATA:  Shortness of breath. EXAM: CHEST - 2 VIEW COMPARISON:  06/23/2020.  FINDINGS: Prior CABG. Cardiomegaly. Diffuse bilateral interstitial prominence with [place] B-lines, progressed from prior exam. Small bilateral pleural effusions. Findings most consistent with CHF. Pneumonitis cannot be excluded. No pneumothorax. Prior cervical spine fusion. IMPRESSION: Findings consistent CHF with progressive pulmonary interstitial edema. Electronically Signed   By: Marcello Moores  Register   On: 11/18/2020 12:58    Procedures Procedures   Medications Ordered in ED Medications - No data to display  ED Course  I have reviewed the triage vital signs and the nursing notes.  Pertinent labs & imaging results that were available during my care of the patient were reviewed by me and considered in my medical decision making (see chart for details).    MDM Rules/Calculators/A&P                          Patient with shortness of breath and increasing fluid status.  Is on oral Bumex and has had increased by her nephrologist.  However still not urinating much.  Now appears to be volume overload with CHF.  Sent down for admission for diuresis and potentially arrangement of dialysis.  Plan was for peritoneal dialysis but may need temporary bridge.  Admit to internal medicine and I have discussed with nephrology. Final Clinical Impression(s) / ED Diagnoses Final diagnoses:  Acute kidney injury superimposed on chronic kidney disease ([place])  Congestive heart failure, unspecified HF chronicity, unspecified heart failure type [facility])    Rx / DC Orders ED Discharge Orders    None       Davonna Belling, MD 11/18/20 1527

## 2020-11-18 NOTE — H&P (Signed)
History and Physical    Joyce [name] WCB:[id number] DOB: 1941/04/13 DOA: 11/18/2020  PCP: Allie Dimmer, MD Consultants:  Lamonte Sakai - pulmonology; Domenic Polite - cardiology; Nilda Calamity - nephrology Patient coming from:  Home - lives alone; NOK: Daughter, Lattie Haw, [place]  Chief Complaint: SOB, edema  HPI: Joyce [name] is a 80 y.o. female with medical history significant of DM; HLD; HTN; fibromyalgia; COPD; CAD s/p CABG; and stage 5 CKD (preparing to start PD) presenting with SOB and edema.  She called her nephrologist this AM - has been gathering fluid for the last few days.  Her nephrologist has changed her from Lasix to Bumex last week.  This AM, he had her take an extra dose.  She is getting to a point where she can't stand up to do anything, or to even breathe.   She last voided a small amount overnight.  She  Has been discussing starting PD for about a month.  She does not have a catheter yet.  She went yesterday "to get my first instructions," saw someone at the dialysis center.  Dr. Nilda Calamity wanted to start PD, did not discuss other options.    ED Course:  Not yet on dialysis.  Planning PD, increasing Bumex doses.  CXR with edema, LE edema.  [place] to see, likely needs [facility] for initiation of HD at least temporarily.  Review of Systems: As per HPI; otherwise review of systems reviewed and negative.   Ambulatory Status:  Ambulates without assistance  COVID Vaccine Status:   Complete  Past Medical History:  Diagnosis Date  . Anemia   . Arthritis   . Asthma   . Bilateral pleural effusion    Postoperative, status post Pleurx catheter and talc treatment - Dr. Prescott Gum  . 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 Chronic kidney disease (CKD), stage III (moderate) (HCC)   . Chronic lower back pain   . COPD (chronic obstructive pulmonary disease) ([place])   . Essential hypertension   . Fibromyalgia    . History of pneumonia   . Hyperlipidemia   . Myocardial infarction ([place])   . Type II diabetes mellitus ([place])     Past Surgical History:  Procedure Laterality Date  . ABDOMINAL HYSTERECTOMY  1980's  . ANTERIOR CERVICAL DECOMP/DISCECTOMY FUSION  2000's  . APPENDECTOMY  1970's  . BACK SURGERY    . BIOPSY  08/28/2019   Procedure: BIOPSY;  Surgeon: Rogene Houston, MD;  [facility];  Service: Endoscopy;;  duodenal  . CHEST TUBE INSERTION Left 08/07/2014   Procedure: INSERTION PLEURAL DRAINAGE CATHETER;  Surgeon: Ivin Poot, MD;  Location: [place];  Service: Thoracic;  Laterality: Left;  . CHEST TUBE INSERTION Right 08/12/2014   Procedure: INSERTION PLEURAL DRAINAGE CATHETER;  Surgeon: Ivin Poot, MD;  Location: [place];  Service: Thoracic;  Laterality: Right;  . CHOLECYSTECTOMY  ~ 2005  . COLONOSCOPY N/A 08/28/2019   Procedure: COLONOSCOPY;  Surgeon: Rogene Houston, MD;  [facility];  Service: Endoscopy;  Laterality: N/A;  100  . CORONARY ARTERY BYPASS GRAFT N/A 06/15/2014   Procedure: CORONARY ARTERY BYPASS GRAFTING (CABG);  Surgeon: Ivin Poot, MD;  Location: [place];  Service: Open Heart Surgery;  Laterality: N/A;  . CORONARY ATHERECTOMY N/A 05/22/2018   Procedure: CORONARY ATHERECTOMY;  Surgeon: Belva Crome, MD;  Location: [facility] INVASIVE CV  LAB;  Service: Cardiovascular;  Laterality: N/A;  . CORONARY STENT INTERVENTION N/A 05/22/2018   Procedure: CORONARY STENT INTERVENTION;  Surgeon: Belva Crome, MD;  Location: [place] CV LAB;  Service: Cardiovascular;  Laterality: N/A;  rca   . ESOPHAGOGASTRODUODENOSCOPY N/A 08/28/2019   Procedure: ESOPHAGOGASTRODUODENOSCOPY (EGD);  Surgeon: Rogene Houston, MD;  [facility];  Service: Endoscopy;  Laterality: N/A;  . INTRAOPERATIVE TRANSESOPHAGEAL ECHOCARDIOGRAM N/A 06/15/2014   Procedure: INTRAOPERATIVE TRANSESOPHAGEAL ECHOCARDIOGRAM;  Surgeon: Ivin Poot, MD;  Location: [place];  Service: Open  Heart Surgery;  Laterality: N/A;  . LEFT HEART CATH AND CORS/GRAFTS ANGIOGRAPHY N/A 05/20/2018   Procedure: LEFT HEART CATH AND CORS/GRAFTS ANGIOGRAPHY;  Surgeon: Lorretta Harp, MD;  Location: [place] CV LAB;  Service: Cardiovascular;  Laterality: N/A;  . LEFT HEART CATHETERIZATION WITH CORONARY ANGIOGRAM N/A 06/11/2014   Procedure: LEFT HEART CATHETERIZATION WITH CORONARY ANGIOGRAM;  Surgeon: Blane Ohara, MD;  Location: [facility] CATH LAB;  Service: Cardiovascular;  Laterality: N/A;  . LUMBAR DISC SURGERY  1980's X 1; 1990's X  2000's X 1  . POLYPECTOMY  08/28/2019   Procedure: POLYPECTOMY;  Surgeon: Rogene Houston, MD;  [facility];  Service: Endoscopy;;  . REMOVAL OF PLEURAL DRAINAGE CATHETER Bilateral 02/11/2015   Procedure: REMOVAL OF PLEURAL DRAINAGE CATHETER;  Surgeon: Ivin Poot, MD;  Location[place];  Service: Thoracic;  Laterality: Bilateral;  . TALC PLEURODESIS Bilateral 02/11/2015   Procedure: Pietro Cassis;  Surgeon: Ivin Poot, MD;  Location: [place];  Service: Thoracic;  Laterality: Bilateral;  . TONSILLECTOMY  ~ 1950  . TOTAL HIP ARTHROPLASTY Right 02/02/2020   Procedure: RIGHT TOTAL HIP ARTHROPLASTY ANTERIOR APPROACH;  Surgeon: Frederik Pear, MD;  [facility];  Service: Orthopedics;  Laterality: Right;    Social History   Socioeconomic History  . Marital status: Widowed    Spouse name: Not on file  . Number of children: 2  . Years of education: Not on file  . Highest education level: Not on file  Occupational History  . Occupation: retired  Tobacco Use  . Smoking status: Never Smoker  . Smokeless tobacco: Never Used  Vaping Use  . Vaping Use: Never used  Substance and Sexual Activity  . Alcohol use: No    Alcohol/week: 0.0 standard drinks  . Drug use: No  . Sexual activity: Not on file  Other Topics Concern  . Not on file  Social History Narrative  . Not on file   Social Determinants of Health   Financial Resource Strain: Not  on file  Food Insecurity: Not on file  Transportation Needs: Not on file  Physical Activity: Not on file  Stress: Not on file  Social Connections: Not on file  Intimate Partner Violence: Not on file    Allergies  Allergen Reactions  . Carvedilol Other (See Comments)    confusion  . Codeine Other (See Comments)    confusion  . Linaclotide Nausea And Vomiting  . Metoprolol Palpitations    Family History  Problem Relation Age of Onset  . Stroke Father   . Diabetes Mother   . Hypertension Mother   . Alcoholism Brother   . Gout Brother   . Arthritis Brother   . Alcoholism Brother   . Heart disease Sister   . Heart attack Sister   . Cancer Sister        hodkins lymphoma  . CAD Paternal Grandmother  heart attack  . CAD Paternal Uncle        heart attack  . Heart attack Paternal Grandfather   . Osteoporosis Sister     Prior to Admission medications   Medication Sig Start Date End Date Taking? Authorizing Provider  ACCU-CHEK AVIVA PLUS test strip Inject 1 strip as directed 4 (four) times daily. 11/16/14   [provider]  albuterol (PROVENTIL) (2.5 MG/3ML) 0.083% nebulizer solution Take 3 mLs (2.5 mg total) by nebulization every 6 (six) hours as needed for wheezing or shortness of breath. 10/26/20   Collene Gobble, MD  albuterol (VENTOLIN HFA) 108 (90 Base) MCG/ACT inhaler Inhale 2 puffs into the lungs every 4 (four) hours as needed for wheezing or shortness of breath. 11/05/20   Collene Gobble, MD  amLODipine (NORVASC) 5 MG tablet Take 5 mg by mouth daily.    [provider]  atorvastatin (LIPITOR) 40 MG tablet Take 40 mg by mouth every evening.  03/21/19   [provider]  bisoprolol (ZEBETA) 5 MG tablet Take 1 tablet (5 mg total) by mouth daily. 10/18/20   Satira Sark, MD  busPIRone (BUSPAR) 5 MG tablet Take 5 mg by mouth daily as needed (anxiety).    [provider]  calcitRIOL (ROCALTROL) 0.25 MCG capsule Take 0.25 mcg by mouth  every Monday, Wednesday, and Friday.    [provider]  Cholecalciferol (VITAMIN D3) 2000 units TABS Take 4,000-6,000 Units by mouth See admin instructions. Take 4000 units by mouth every other day alternating with 6000 units on alternate days    [provider]  clopidogrel (PLAVIX) 75 MG tablet Take 1 tablet (75 mg total) by mouth daily with breakfast. 07/01/20   Satira Sark, MD  fluticasone (FLONASE) 50 MCG/ACT nasal spray Place 1 spray into both nostrils daily.  04/18/18   [provider]  furosemide (LASIX) 80 MG tablet Take 1 tablet (80 mg total) by mouth daily. 05/04/20   Erlene Quan, PA-C  hydrALAZINE (APRESOLINE) 100 MG tablet Take 100 mg by mouth 3 (three) times daily.    [provider]  HYDROmorphone (DILAUDID) 2 MG tablet Take 1 tablet (2 mg total) by mouth every 4 (four) hours as needed for severe pain. 02/02/20   Leighton Parody, PA-C  insulin glargine (LANTUS) 100 UNIT/ML injection Inject 40 Units into the skin at bedtime.     [provider]  insulin lispro (HUMALOG) 100 UNIT/ML KiwkPen Inject 8 Units into the skin 3 (three) times daily with meals.     [provider]  isosorbide mononitrate (IMDUR) 60 MG 24 hr tablet TAKE 1 TABLET BY MOUTH DAILY 09/03/20   Satira Sark, MD  lisinopril (ZESTRIL) 2.5 MG tablet Take 2.5 mg by mouth daily.    [provider]  montelukast (SINGULAIR) 10 MG tablet Take 10 mg by mouth at bedtime.    [provider]  Multiple Vitamin (MULTIVITAMIN WITH MINERALS) TABS tablet Take 1 tablet by mouth daily.    [provider]  naloxone [facility]) nasal spray 4 mg/0.1 mL Place 1 spray into the nose daily as needed (accidental overdose.). Use as directed.     [provider]  nitroGLYCERIN (NITROSTAT) 0.4 MG SL tablet Place 1 tablet (0.4 mg total) under the tongue every 5 (five) minutes x 3 doses as needed for chest pain. 05/26/18   Almyra Deforest, PA  polyethylene glycol  (MIRALAX / GLYCOLAX) 17 g packet Take 17 g by mouth daily  as needed for moderate constipation.    [provider]  primidone (MYSOLINE) 50 MG tablet Take 100 mg by mouth daily.  05/28/14   [provider]  Probiotic Product (PROBIOTIC DAILY PO) Take 1 capsule by mouth in the morning, at noon, and at bedtime.     [provider]  psyllium (METAMUCIL) 58.6 % packet Take by mouth daily.     [provider]  rOPINIRole (REQUIP) 1 MG tablet Take 1 mg by mouth 3 (three) times daily as needed (muscle spasms).    [provider]  sodium bicarbonate 650 MG tablet Take 650 mg by mouth daily. 09/28/20   [provider]  [place] 2.5-2.5 MCG/ACT AERS INHALE 2 PUFFS BY MOUTH DAILY 10/14/20   Collene Gobble, MD  tiZANidine (ZANAFLEX) 2 MG tablet Take 1 tablet (2 mg total) by mouth every 6 (six) hours as needed. 02/02/20   Leighton Parody, PA-C  traMADol (ULTRAM) 50 MG tablet Take 100 mg by mouth every 8 (eight) hours as needed for moderate pain.  03/24/19   [provider]    Physical Exam: Vitals:   11/18/20 1500 11/18/20 1530 11/18/20 1615 11/18/20 1645  BP: (!) 181/60 (!) 178/121 (!) 151/103   Pulse: (!) 59 (!) 58 61 (!) 58  Resp: (!) 21 (!) 24 (!) 23 20  Temp:      TempSrc:      SpO2: 95% 94% 93% 94%     . General:  Appears calm and comfortable and is in NAD . Eyes:  EOMI, normal lids, iris . ENT:  grossly normal hearing, lips & tongue, mmm . Neck:  no LAD, masses or thyromegaly . Cardiovascular:  RRR, no m/r/g. 2-3+ LE edema.  Marland Kitchen Respiratory:   CTA bilaterally with scant bibasilar crackles. Mildly increased respiratory effort. . Abdomen:  soft, NT, ND . Skin:  no rash or induration seen on limited exam . Musculoskeletal:  grossly normal tone BUE/BLE, good ROM, no bony abnormality . Psychiatric:  grossly normal mood and affect, speech fluent and appropriate, AOx3 . Neurologic:  CN 2-12 grossly intact, moves all extremities in  coordinated fashion    Radiological Exams on Admission: Independently reviewed - see discussion in A/P where applicable  DG Chest 2 View  Result Date: 11/18/2020 CLINICAL DATA:  Shortness of breath. EXAM: CHEST - 2 VIEW COMPARISON:  06/23/2020. FINDINGS: Prior CABG. Cardiomegaly. Diffuse bilateral interstitial prominence with Ker[place] B-lines, progressed from prior exam. Small bilateral pleural effusions. Findings most consistent with CHF. Pneumonitis cannot be excluded. No pneumothorax. Prior cervical spine fusion. IMPRESSION: Findings consistent CHF with progressive pulmonary interstitial edema. Electronically Signed   By: Marcello Moores  Register   On: 11/18/2020 12:58   US RENAL  Result Date: 11/18/2020 CLINICAL DATA:  Oliguria. EXAM: RENAL / URINARY TRACT ULTRASOUND COMPLETE COMPARISON:  CT 05/13/2019. FINDINGS: Right Kidney: Renal measurements: 12.9 x 5.4 x 5.2 cm = volume: 192 mL. Mild thinning of renal parenchyma and increased parenchymal echogenicity. No hydronephrosis. No focal lesion, previous tiny stone on CT is not seen. Left Kidney: Renal measurements: 11.0 x 6.2 x 5.3 cm = volume: 192 mL. Mild thinning of renal parenchyma and increased parenchymal echogenicity. No hydronephrosis, focal lesion or stone. Bladder: Appears normal for degree of bladder distention. Other: None. IMPRESSION: 1. No obstructive uropathy. 2. Mild thinning of renal parenchyma and increased echogenicity consistent with chronic medical renal disease. Electronically Signed   By: Keith Rake M.D.   On: 11/18/2020 17:00  EKG: Independently reviewed.  Sinus bradycardia with rate 59; nonspecific ST changes with no evidence of acute ischemia   Labs on Admission: I have personally reviewed the available labs and imaging studies at the time of the admission.  Pertinent labs:   Glucose 169 BN 61/Creatinine 3.41/GFR 13 Albumin 3.2 BNP 1340.4 HS troponin 18 WBC 5.2 Hgb 8.8   Assessment/Plan Principal Problem:    Volume overload Active Problems:   Stage 5 chronic kidney disease (HCC)   Essential hypertension   Diabetes mellitus type 2 in obese (HCC)   S/P CABG x 4   Diastolic CHF, chronic (HCC)   COPD with asthma (HCC)   Fibromyalgia   Volume overload with h/o progressive CKD -Patient with known progressive CKD -Appears to be worsening -Now presenting with volume overload - possibly related to CHF but diuretic-unresponsive so she may be nearing time to start HD -Renal US confirms medical renal disease without obstructive uropathy -Likely to need temporary HD catheter for initiation of HD -Will need vascular access mapping for fistula -Will need clipping if HD is desired -Management as per nephrology -Continue calcitriol, bicarb  Acute on chronic diastolic CHF -Patient with known h/o chronic diastolic CHF, 36/6294 echo with preserved EF and grade 1 diastolic dysfunction -CXR consistent with mild pulmonary edema -Elevated BNP  -With elevated BNP and abnl CXR, acute decompensated CHF seems probable as diagnosis -However, patient was changed from Lasix to Bumex without improvement and now is not responding to doubled dose -Will place in observation status with telemetry, as there are no current findings necessitating admission (hemodynamic instability, severe electrolyte abnormalities, cardiac arrhythmias, ACS, severe pulmonary edema requiring new O2 therapy, AMS) -CHF order set utilized -Lasix dosing per nephrology -Does not currently need [place] O2  HTN -Hold Norvasc in case this is contributing to volume overload -Continue bisoprolol,  -Will also add prn hydralazine  HLD -Continue Lipitor  DM -Last A1c was 5.3, will repeat -Appears to be taking 30 units Lantus daily prn; will change to 15 units standing for now -Will cover with very sensitive-scale SSI for now  CAD -s/p CABG -Continue Plavix, Imdur  COPD -Stiolto on hold since not on formulary -Continue Albuterol, Singulair,  Flonase  Fibromyalgia -I have reviewed this patient in the [place] Controlled Substances Reporting System.  She is receiving medications from only one provider and appears to be taking them as prescribed. -She is not at particularly high risk of opioid misuse, diversion, or overdose - assuming she is not still taking Dilaudid (which was reported as an active medication on her Med Rec) -Continue Ultram, Buspar, Primidone, Requip    Note: This patient has been tested and is negative for the novel coronavirus COVID-19. She has been fully vaccinated against COVID-19.   DVT prophylaxis: Heparin  Code Status:  DNR - confirmed with patient/family Family Communication: Daughter was present throughout evaluation Disposition Plan:  The patient is from: home  Anticipated d/c is to: home, possibly with [facility] services  Anticipated d/c date will depend on clinical response to treatment, possibly as early as tomorrow  Patient is currently: acutely ill Consults called: Nephrology; [facility] team; PT/OT/Nutrition Admission status: It is my clinical opinion that referral for OBSERVATION is reasonable and necessary in this patient based on the above information provided. The aforementioned taken together are felt to place the patient at high risk for further clinical deterioration. However it is anticipated that the patient may be medically stable for discharge from the hospital within 24 to 48 hours.  Karmen Bongo MD [facility]   How to contact the [facility] Attending or Consulting provider [place] or covering provider during after hours [place], for this patient?  1. Check the care team in [facility] and look for a) attending/consulting [facility] provider listed and b) the [facility] team listed 2. Log into [email or web address] and use [place]'s universal password to access. If you do not have the password, please contact the hospital operator. 3. Locate the [facility] provider you are looking for under [facility] and page to a  number that you can be directly reached. 4. If you still have difficulty reaching the provider, please page the [facility] (Director on Call) for the Hospitalists listed on amion for assistance.   11/18/2020, 6:02 PM

## 2020-11-18 NOTE — Significant Event (Signed)
The nurse notified me that the patient wanted to discuss with me about her CODE STATUS and rescind her DNR and wants to make it full code.  I did discuss with patient and patient is well oriented and patient's DNR has been canceled and patient is full code.  Patient's nurse notified.  Gean Birchwood

## 2020-11-18 NOTE — Consult Note (Signed)
[facility] Renal Consultation Note  Requesting MD: Lorin Mercy Indication for Consultation: CKD, volume overload  HPI:  Joyce [name] is a 80 y.o. 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 Lamonte Sakai in pulmonary.  She also appears quite anxious and has chronic pain from fibromyalgia.  She has CKD that has progressed.  Followed by a Dr. Nilda Calamity in [place].  crt seems to have progressed-  Was 2.7 in December, 2.9 in January and most recetnly over 3 indicating  GFR in the low teens so she was sent for dialysis education which she just had yesterday-is considering PD but does not have all of the information just yet.  Patient reports that over the last week she has become more short of breath and has noted over the last 3 days drop in urine output.  She called her nephrologist with these complaints, was told to double up on her diuretic which she did but urine output did not improve.  She is wavering with her complaint but has told some providers that she has made absolutely no urine since yesterday.  She has edema and chest x-ray is consistent with CHF.  She also complains of itching but has not had any appetite disturbance.  Labs of note include BUN and creatinine 61 and 3.4, potassium 4.6, bicarb 20, albumin 3.2, hemoglobin 8.8, BNP 1300.  Interestingly, pulse ox is greater than 95 on room air  Creatinine, Ser  Date/Time Value Ref Range Status  11/18/2020 12:26 PM 3.41 (H) 0.44 - 1.00 mg/dL Final  05/04/2020 11:26 AM 2.37 (H) 0.44 - 1.00 mg/dL Final  02/03/2020 03:25 AM 2.40 (H) 0.44 - 1.00 mg/dL Final  01/22/2020 12:05 PM 2.07 (H) 0.44 - 1.00 mg/dL Final  08/01/2019 10:59 AM 1.98 (H) 0.44 - 1.00 mg/dL Final  06/04/2019 01:09 PM 1.89 (H) 0.44 - 1.00 mg/dL Final  01/30/2019 02:41 PM 1.86 (H) 0.44 - 1.00 mg/dL Final   05/29/2018 11:36 AM 1.76 (H) 0.44 - 1.00 mg/dL Final  05/26/2018 04:27 AM 2.03 (H) 0.44 - 1.00 mg/dL Final  05/25/2018 03:22 AM 1.80 (H) 0.44 - 1.00 mg/dL Final  05/24/2018 04:15 AM 1.80 (H) 0.44 - 1.00 mg/dL Final  05/23/2018 02:25 AM 1.64 (H) 0.44 - 1.00 mg/dL Final  05/22/2018 03:57 AM 1.76 (H) 0.44 - 1.00 mg/dL Final  05/21/2018 05:38 AM 1.72 (H) 0.44 - 1.00 mg/dL Final  05/20/2018 04:17 AM 1.90 (H) 0.44 - 1.00 mg/dL Final  05/18/2018 05:42 AM 2.20 (H) 0.44 - 1.00 mg/dL Final  05/18/2018 01:04 AM 2.11 (H) 0.44 - 1.00 mg/dL Final  12/23/2014 03:46 PM 1.78 (H) 0.40 - 1.20 mg/dL Final  10/23/2014 11:49 AM 1.48 (H) 0.40 - 1.20 mg/dL Final  09/23/2014 11:35 AM 1.40 (H) 0.40 - 1.20 mg/dL Final  08/12/2014 05:48 AM 1.42 (H) 0.50 - 1.10 mg/dL Final  08/09/2014 04:34 AM 1.40 (H) 0.50 - 1.10 mg/dL Final  08/08/2014 04:40 AM 1.41 (H) 0.50 - 1.10 mg/dL Final  08/06/2014 05:58 AM 1.14 (H) 0.50 - 1.10 mg/dL Final  08/05/2014 04:36 AM 1.42 (H) 0.50 - 1.10 mg/dL Final  08/04/2014 01:12 AM 1.63 (H) 0.50 - 1.10 mg/dL Final  07/31/2014 05:48 AM 1.79 (H) 0.50 - 1.10 mg/dL Final  07/30/2014 05:00 AM 2.01 (H) 0.50 - 1.10 mg/dL Final  07/29/2014 03:36 AM 2.43 (H) 0.50 - 1.10 mg/dL Final  07/28/2014 04:15 AM 1.82 (H) 0.50 -  1.10 mg/dL Final  07/27/2014 04:57 AM 1.62 (H) 0.50 - 1.10 mg/dL Final  07/26/2014 10:15 AM 1.50 (H) 0.50 - 1.10 mg/dL Final  06/21/2014 10:50 AM 1.43 (H) 0.50 - 1.10 mg/dL Final  06/19/2014 03:21 AM 1.65 (H) 0.50 - 1.10 mg/dL Final  06/18/2014 05:25 AM 1.91 (H) 0.50 - 1.10 mg/dL Final  06/17/2014 04:30 AM 1.91 (H) 0.50 - 1.10 mg/dL Final  06/16/2014 05:12 PM 1.90 (H) 0.50 - 1.10 mg/dL Final  06/16/2014 05:04 PM 1.90 (H) 0.50 - 1.10 mg/dL Final  06/16/2014 04:50 AM 1.51 (H) 0.50 - 1.10 mg/dL Final  06/15/2014 08:18 PM 1.40 (H) 0.50 - 1.10 mg/dL Final  06/15/2014 08:05 PM 1.39 (H) 0.50 - 1.10 mg/dL Final  06/15/2014 12:28 PM 1.40 (H) 0.50 - 1.10 mg/dL Final  06/15/2014 11:33 AM  1.30 (H) 0.50 - 1.10 mg/dL Final  06/15/2014 11:06 AM 1.30 (H) 0.50 - 1.10 mg/dL Final  06/15/2014 09:52 AM 1.40 (H) 0.50 - 1.10 mg/dL Final  06/15/2014 07:53 AM 1.20 (H) 0.50 - 1.10 mg/dL Final  06/14/2014 03:52 AM 1.56 (H) 0.50 - 1.10 mg/dL Final  06/13/2014 01:19 PM 1.55 (H) 0.50 - 1.10 mg/dL Final  06/13/2014 02:45 AM 1.55 (H) 0.50 - 1.10 mg/dL Final  06/11/2014 07:05 AM 1.33 (H) 0.50 - 1.10 mg/dL Final  07/20/2010 11:12 AM 1.23 (H) 0.4 - 1.2 mg/dL Final     PMHx:   Past Medical History:  Diagnosis Date  . Anemia   . Arthritis   . Asthma   . Bilateral pleural effusion    Postoperative, status post Pleurx catheter and talc treatment - Dr. Prescott Gum  . 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 Chronic kidney disease (CKD), stage III (moderate) (HCC)   . Chronic lower back pain   . COPD (chronic obstructive pulmonary disease) ([place])   . Essential hypertension   . Fibromyalgia   . History of pneumonia   . Hyperlipidemia   . Myocardial infarction ([place])   . Type II diabetes mellitus ([place])     Past Surgical History:  Procedure Laterality Date  . ABDOMINAL HYSTERECTOMY  1980's  . ANTERIOR CERVICAL DECOMP/DISCECTOMY FUSION  2000's  . APPENDECTOMY  1970's  . BACK SURGERY    . BIOPSY  08/28/2019   Procedure: BIOPSY;  Surgeon: Rogene Houston, MD;  [facility];  Service: Endoscopy;;  duodenal  . CHEST TUBE INSERTION Left 08/07/2014   Procedure: INSERTION PLEURAL DRAINAGE CATHETER;  Surgeon: Ivin Poot, MD;  Location: [place];  Service: Thoracic;  Laterality: Left;  . CHEST TUBE INSERTION Right 08/12/2014   Procedure: INSERTION PLEURAL DRAINAGE CATHETER;  Surgeon: Ivin Poot, MD;  Location: [place];  Service: Thoracic;  Laterality: Right;  . CHOLECYSTECTOMY  ~ 2005  . COLONOSCOPY N/A 08/28/2019   Procedure: COLONOSCOPY;  Surgeon: Rogene Houston, MD;  [facility];   Service: Endoscopy;  Laterality: N/A;  100  . CORONARY ARTERY BYPASS GRAFT N/A 06/15/2014   Procedure: CORONARY ARTERY BYPASS GRAFTING (CABG);  Surgeon: Ivin Poot, MD;  Location: [place];  Service: Open Heart Surgery;  Laterality: N/A;  . CORONARY ATHERECTOMY N/A 05/22/2018   Procedure: CORONARY ATHERECTOMY;  Surgeon: Belva Crome, MD;  Location: [place] [facility];  Service: Cardiovascular;  Laterality: N/A;  . CORONARY STENT INTERVENTION N/A 05/22/2018   Procedure: CORONARY STENT INTERVENTION;  Surgeon: Belva Crome, MD;  Location: [place] [facility];  Service: Cardiovascular;  Laterality: N/A;  rca   . ESOPHAGOGASTRODUODENOSCOPY N/A 08/28/2019   Procedure: ESOPHAGOGASTRODUODENOSCOPY (EGD);  Surgeon: Rogene Houston, MD;  [facility];  Service: Endoscopy;  Laterality: N/A;  . INTRAOPERATIVE TRANSESOPHAGEAL ECHOCARDIOGRAM N/A 06/15/2014   Procedure: INTRAOPERATIVE TRANSESOPHAGEAL ECHOCARDIOGRAM;  Surgeon: Ivin Poot, MD;  Location: [place];  Service: Open Heart Surgery;  Laterality: N/A;  . LEFT HEART CATH AND CORS/GRAFTS ANGIOGRAPHY N/A 05/20/2018   Procedure: LEFT HEART CATH AND CORS/GRAFTS ANGIOGRAPHY;  Surgeon: Lorretta Harp, MD;  Location: [place] [facility];  Service: Cardiovascular;  Laterality: N/A;  . LEFT HEART CATHETERIZATION WITH CORONARY ANGIOGRAM N/A 06/11/2014   Procedure: LEFT HEART CATHETERIZATION WITH CORONARY ANGIOGRAM;  Surgeon: Blane Ohara, MD;  Location: [facility];  Service: Cardiovascular;  Laterality: N/A;  . LUMBAR DISC SURGERY  1980's X 1; 1990's X  2000's X 1  . POLYPECTOMY  08/28/2019   Procedure: POLYPECTOMY;  Surgeon: Rogene Houston, MD;  [facility];  Service: Endoscopy;;  . REMOVAL OF PLEURAL DRAINAGE CATHETER Bilateral 02/11/2015   Procedure: REMOVAL OF PLEURAL DRAINAGE CATHETER;  Surgeon: Ivin Poot, MD;  Location: [place];  Service: Thoracic;  Laterality: Bilateral;  . TALC PLEURODESIS Bilateral 02/11/2015    Procedure: Pietro Cassis;  Surgeon: Ivin Poot, MD;  Location: [place];  Service: Thoracic;  Laterality: Bilateral;  . TONSILLECTOMY  ~ 1950  . TOTAL HIP ARTHROPLASTY Right 02/02/2020   Procedure: RIGHT TOTAL HIP ARTHROPLASTY ANTERIOR APPROACH;  Surgeon: Frederik Pear, MD;  Location[facility];  Service: Orthopedics;  Laterality: Right;    Family Hx:  Family History  Problem Relation Age of Onset  . Stroke Father   . Diabetes Mother   . Hypertension Mother   . Alcoholism Brother   . Gout Brother   . Arthritis Brother   . Alcoholism Brother   . Heart disease Sister   . Heart attack Sister   . Cancer Sister        hodkins lymphoma  . CAD Paternal Grandmother        heart attack  . CAD Paternal Uncle        heart attack  . Heart attack Paternal Grandfather   . Osteoporosis Sister     Social History:  reports that she has never smoked. She has never used smokeless tobacco. She reports that she does not drink alcohol and does not use drugs.  Allergies:  Allergies  Allergen Reactions  . Carvedilol Other (See Comments)    confusion  . Codeine Other (See Comments)    confusion  . Linaclotide Nausea And Vomiting  . Metoprolol Palpitations    Medications: Prior to Admission medications   Medication Sig Start Date End Date Taking? Authorizing Provider  albuterol (PROVENTIL) (2.5 MG/3ML) 0.083% nebulizer solution Take 3 mLs (2.5 mg total) by nebulization every 6 (six) hours as needed for wheezing or shortness of breath. 10/26/20  Yes Collene Gobble, MD  albuterol (VENTOLIN HFA) 108 (90 Base) MCG/ACT inhaler Inhale 2 puffs into the lungs every 4 (four) hours as needed for wheezing or shortness of breath. 11/05/20  Yes Collene Gobble, MD  amLODipine (NORVASC) 10 MG tablet Take 10 mg by mouth daily.   Yes [provider]  atorvastatin (LIPITOR) 40 MG tablet Take 40 mg by mouth every evening.  03/21/19  Yes [provider]  bisoprolol (ZEBETA) 5 MG  tablet Take 1 tablet  (5 mg total) by mouth daily. 10/18/20  Yes Satira Sark, MD  busPIRone (BUSPAR) 5 MG tablet Take 5 mg by mouth daily as needed (anxiety).   Yes [provider]  calcitRIOL (ROCALTROL) 0.25 MCG capsule Take 0.25 mcg by mouth daily.   Yes [provider]  Cholecalciferol (VITAMIN D3) 2000 units TABS Take 4,000-6,000 Units by mouth See admin instructions. Take 4000 units by mouth every other day alternating with 6000 units on alternate days   Yes [provider]  clopidogrel (PLAVIX) 75 MG tablet Take 1 tablet (75 mg total) by mouth daily with breakfast. 07/01/20  Yes Satira Sark, MD  fluticasone (FLONASE) 50 MCG/ACT nasal spray Place 1 spray into both nostrils daily.  04/18/18  Yes [provider]  [place] 2.5-2.5 MCG/ACT AERS INHALE 2 PUFFS BY MOUTH DAILY 10/14/20  Yes Byrum, Rose Fillers, MD  ACCU-CHEK AVIVA PLUS test strip Inject 1 strip as directed 4 (four) times daily. 11/16/14   [provider]  amLODipine (NORVASC) 5 MG tablet Take 5 mg by mouth daily.    [provider]  furosemide (LASIX) 80 MG tablet Take 1 tablet (80 mg total) by mouth daily. 05/04/20   Erlene Quan, PA-C  hydrALAZINE (APRESOLINE) 100 MG tablet Take 100 mg by mouth 3 (three) times daily.    [provider]  HYDROmorphone (DILAUDID) 2 MG tablet Take 1 tablet (2 mg total) by mouth every 4 (four) hours as needed for severe pain. 02/02/20   Leighton Parody, PA-C  insulin glargine (LANTUS) 100 UNIT/ML injection Inject 40 Units into the skin at bedtime.     [provider]  insulin lispro (HUMALOG) 100 UNIT/ML KiwkPen Inject 8 Units into the skin 3 (three) times daily with meals.     [provider]  isosorbide mononitrate (IMDUR) 60 MG 24 hr tablet TAKE 1 TABLET BY MOUTH DAILY 09/03/20   Satira Sark, MD  lisinopril (ZESTRIL) 2.5 MG tablet Take 2.5 mg by mouth daily.    [provider]  montelukast (SINGULAIR) 10 MG tablet  Take 10 mg by mouth at bedtime.    [provider]  Multiple Vitamin (MULTIVITAMIN WITH MINERALS) TABS tablet Take 1 tablet by mouth daily.    [provider]  naloxone [facility]) nasal spray 4 mg/0.1 mL Place 1 spray into the nose daily as needed (accidental overdose.). Use as directed.     [provider]  nitroGLYCERIN (NITROSTAT) 0.4 MG SL tablet Place 1 tablet (0.4 mg total) under the tongue every 5 (five) minutes x 3 doses as needed for chest pain. 05/26/18   Almyra Deforest, PA  polyethylene glycol (MIRALAX / GLYCOLAX) 17 g packet Take 17 g by mouth daily as needed for moderate constipation.    [provider]  primidone (MYSOLINE) 50 MG tablet Take 100 mg by mouth daily.  05/28/14   [provider]  Probiotic Product (PROBIOTIC DAILY PO) Take 1 capsule by mouth in the morning, at noon, and at bedtime.     [provider]  psyllium (METAMUCIL) 58.6 % packet Take by mouth daily.     [provider]  rOPINIRole (REQUIP) 1 MG tablet Take 1 mg by mouth 3 (three) times daily as needed (muscle spasms).    [provider]  sodium bicarbonate 650 MG tablet Take 650 mg by mouth daily. 09/28/20   [provider]  tiZANidine (ZANAFLEX) 2 MG tablet Take 1 tablet (  2 mg total) by mouth every 6 (six) hours as needed. 02/02/20   Leighton Parody, PA-C  traMADol (ULTRAM) 50 MG tablet Take 100 mg by mouth every 8 (eight) hours as needed for moderate pain.  03/24/19   [provider]    I have reviewed the patient's current medications.  Labs:  Results for orders placed or performed during the hospital encounter of 11/18/20 (from the past 48 hour(s))  CBC with Differential     Status: Abnormal   Collection Time: 11/18/20 12:26 PM  Result Value Ref Range   WBC 5.2 4.0 - 10.5 K/uL   RBC 3.02 (L) 3.87 - 5.11 MIL/uL   Hemoglobin 8.8 (L) 12.0 - 15.0 g/dL   HCT 28.8 (L) 36.0 - 46.0 %   MCV 95.4 80.0 - 100.0 fL   MCH 29.1 26.0 - 34.0 pg    MCHC 30.6 30.0 - 36.0 g/dL   RDW 13.3 11.5 - 15.5 %   Platelets 170 150 - 400 K/uL   nRBC 0.0 0.0 - 0.2 %   Neutrophils Relative % 77 %   Neutro Abs 4.0 1.7 - 7.7 K/uL   Lymphocytes Relative 16 %   Lymphs Abs 0.8 0.7 - 4.0 K/uL   Monocytes Relative 6 %   Monocytes Absolute 0.3 0.1 - 1.0 K/uL   Eosinophils Relative 1 %   Eosinophils Absolute 0.1 0.0 - 0.5 K/uL   Basophils Relative 0 %   Basophils Absolute 0.0 0.0 - 0.1 K/uL   Immature Granulocytes 0 %   Abs Immature Granulocytes 0.01 0.00 - 0.07 K/uL    Comment: Performed at [facility], [address], [place], [place] [address]  Comprehensive metabolic panel     Status: Abnormal   Collection Time: 11/18/20 12:26 PM  Result Value Ref Range   Sodium 139 135 - 145 mmol/L   Potassium 4.6 3.5 - 5.1 mmol/L   Chloride 109 98 - 111 mmol/L   CO2 20 (L) 22 - 32 mmol/L   Glucose, Bld 169 (H) 70 - 99 mg/dL    Comment: Glucose reference range applies only to samples taken after fasting for at least 8 hours.   BUN 61 (H) 8 - 23 mg/dL   Creatinine, Ser 3.41 (H) 0.44 - 1.00 mg/dL   Calcium 8.6 (L) 8.9 - 10.3 mg/dL   Total Protein 5.7 (L) 6.5 - 8.1 g/dL   Albumin 3.2 (L) 3.5 - 5.0 g/dL   AST 25 15 - 41 U/L   ALT 29 0 - 44 U/L   Alkaline Phosphatase 90 38 - 126 U/L   Total Bilirubin 0.9 0.3 - 1.2 mg/dL   GFR, Estimated 13 (L) >60 mL/min    Comment: (NOTE) Calculated using the CKD-EPI Creatinine Equation (2021)    Anion gap 10 5 - 15    Comment: Performed at [facility], [place] [address] [place], [place] [address]  Troponin I (High Sensitivity)     Status: Abnormal   Collection Time: 11/18/20 12:26 PM  Result Value Ref Range   Troponin I (High Sensitivity) 18 (H) <18 ng/L    Comment: (NOTE) Elevated high sensitivity troponin I (hsTnI) values and significant  changes across serial measurements may suggest ACS but many other  chronic and acute conditions are known to elevate hsTnI results.  Refer to the "Links" section  for chest pain algorithms and additional  guidance. Performed at [facility], [place] [address], [place], [place] [address]  Brain natriuretic peptide     Status: Abnormal   Collection Time: 11/18/20 12:27 PM  Result Value Ref Range   B Natriuretic Peptide 1,340.4 (H) 0.0 - 100.0 pg/mL    Comment: Performed at [facility] [address], [place], [place] [address]  Resp Panel by RT-PCR (Flu A&B, Covid) Nasopharyngeal Swab     Status: None   Collection Time: 11/18/20  1:28 PM   Specimen: Nasopharyngeal Swab; Nasopharyngeal(NP) swabs in vial transport medium  Result Value Ref Range   SARS Coronavirus 2 by RT PCR NEGATIVE NEGATIVE    Comment: (NOTE) SARS-CoV-2 target nucleic acids are NOT DETECTED.  The SARS-CoV-2 RNA is generally detectable in upper respiratory specimens during the acute phase of infection. The lowest concentration of SARS-CoV-2 viral copies this assay can detect is 138 copies/mL. A negative result does not preclude SARS-Cov-2 infection and should not be used as the sole basis for treatment or other patient management decisions. A negative result may occur with  improper specimen collection/handling, submission of specimen other than nasopharyngeal swab, presence of viral mutation(s) within the areas targeted by this assay, and inadequate number of viral copies(<138 copies/mL). A negative result must be combined with clinical observations, patient history, and epidemiological information. The expected result is Negative.  Fact Sheet for Patients:  EntrepreneurPulse.com.au  Fact Sheet for Healthcare Providers:  IncredibleEmployment.be  This test is no t yet approved or cleared by the [place] FDA and  has been authorized for detection and/or diagnosis of SARS-CoV-2 by FDA under an Emergency Use Authorization (EUA). This EUA will remain  in effect (meaning this test can be used) for the duration of the COVID-19  declaration under Section 564(b)(1) of the Act, 21 U.S.C.section 360bbb-3(b)(1), unless the authorization is terminated  or revoked sooner.       Influenza A by PCR NEGATIVE NEGATIVE   Influenza B by PCR NEGATIVE NEGATIVE    Comment: (NOTE) The Xpert Xpress SARS-CoV-2/FLU/RSV plus assay is intended as an aid in the diagnosis of influenza from Nasopharyngeal swab specimens and should not be used as a sole basis for treatment. Nasal washings and aspirates are unacceptable for Xpert Xpress SARS-CoV-2/FLU/RSV testing.  Fact Sheet for Patients: EntrepreneurPulse.com.au  Fact Sheet for Healthcare Providers: IncredibleEmployment.be  This test is not yet approved or cleared by the [place] FDA and has been authorized for detection and/or diagnosis of SARS-CoV-2 by FDA under an Emergency Use Authorization (EUA). This EUA will remain in effect (meaning this test can be used) for the duration of the COVID-19 declaration under Section 564(b)(1) of the Act, 21 U.S.C. section 360bbb-3(b)(1), unless the authorization is terminated or revoked.  Performed at [facility], [place] [address] [place], [place] [address]      ROS:  A comprehensive review of systems was negative except for: Respiratory: positive for dyspnea on exertion Genitourinary: positive for decreased stream Itching  Physical Exam: Vitals:   11/18/20 1500 11/18/20 1530  BP: (!) 181/60 (!) 178/121  Pulse: (!) 59 (!) 58  Resp: (!) 21 (!) 24  Temp:    SpO2: 95% 94%     General: Anxious, obese, pale white female-is able to talk in full sentences-pulse ox greater than 95 on room air HEENT: Pupils are equally round and reactive to light, extraocular motions are intact, mucous membranes are moist Neck: Positive for JVD Heart: Bradycardic Lungs: Feels he is unable to take a deep breath, decreased breath sounds at the bases Abdomen: Obese, soft, nontender  Extremities: Pitting  edema to dependent areas Skin: Warm and dry Neuro: Anxious, alert, nonfocal  Assessment/Plan: 80 year old white female with many medical problems including CKD that has been slowly progressive.  Her main issue now seems to be volume overload and decreased urine output 1.Renal- GFR has not changed significantly since 5 months ago.  She does not appear to be having overwhelming uremic symptoms.  The biggest issue is volume overload plus or minus some decreased urine output.  I am going to get a renal ultrasound to rule out bladder outlet obstruction, also check UA and try to induce urine output with IV Lasix.  Otherwise, there does not appear to be acute indications to initiate dialysis at this time.  However, if is refractory to diuresis , dialysis may need to be started this hospitalization.  It was discussed with her that we do not have the capability to initiate PD here in the hospital in short order so we would have to institute hemodialysis and she could be set up for peritoneal dialysis as an outpatient 2. Hypertension/volume  -blood pressure right now if anything is high.  She has volume overload.  Use moderate to high-dose IV diuretics 3. Anemia  -she seems more anemic than she has been in the past.  This could be leading to subjective feelings of shortness of breath.  I will check iron stores and give her dose of ESA 4.  Bones-is on calcitriol as outpatient, will be continued 5.  Metabolic acidosis-is on sodium bicarb as outpatient this also will be continued   Louis Meckel 11/18/2020, 4:10 PM

## 2020-11-18 NOTE — ED Triage Notes (Signed)
Pt reports that she is getting ready to start peritoneal dialysis. Having increase in sob and leg swelling. Took 2 fluid pills this am with no relief. Spo2 97% at triage. Denies cp.

## 2020-11-19 DIAGNOSIS — E8770 Fluid overload, unspecified: Secondary | ICD-10-CM | POA: Diagnosis present

## 2020-11-19 DIAGNOSIS — Z951 Presence of aortocoronary bypass graft: Secondary | ICD-10-CM | POA: Diagnosis not present

## 2020-11-19 DIAGNOSIS — J449 Chronic obstructive pulmonary disease, unspecified: Secondary | ICD-10-CM | POA: Diagnosis present

## 2020-11-19 DIAGNOSIS — M797 Fibromyalgia: Secondary | ICD-10-CM | POA: Diagnosis present

## 2020-11-19 DIAGNOSIS — E1169 Type 2 diabetes mellitus with other specified complication: Secondary | ICD-10-CM | POA: Diagnosis not present

## 2020-11-19 DIAGNOSIS — D631 Anemia in chronic kidney disease: Secondary | ICD-10-CM | POA: Diagnosis present

## 2020-11-19 DIAGNOSIS — Z20822 Contact with and (suspected) exposure to covid-19: Secondary | ICD-10-CM | POA: Diagnosis present

## 2020-11-19 DIAGNOSIS — Z833 Family history of diabetes mellitus: Secondary | ICD-10-CM | POA: Diagnosis not present

## 2020-11-19 DIAGNOSIS — E1165 Type 2 diabetes mellitus with hyperglycemia: Secondary | ICD-10-CM | POA: Diagnosis present

## 2020-11-19 DIAGNOSIS — N179 Acute kidney failure, unspecified: Secondary | ICD-10-CM | POA: Diagnosis present

## 2020-11-19 DIAGNOSIS — N185 Chronic kidney disease, stage 5: Secondary | ICD-10-CM | POA: Diagnosis present

## 2020-11-19 DIAGNOSIS — Z90710 Acquired absence of both cervix and uterus: Secondary | ICD-10-CM | POA: Diagnosis not present

## 2020-11-19 DIAGNOSIS — Z8249 Family history of ischemic heart disease and other diseases of the circulatory system: Secondary | ICD-10-CM | POA: Diagnosis not present

## 2020-11-19 DIAGNOSIS — I252 Old myocardial infarction: Secondary | ICD-10-CM | POA: Diagnosis not present

## 2020-11-19 DIAGNOSIS — E785 Hyperlipidemia, unspecified: Secondary | ICD-10-CM | POA: Diagnosis present

## 2020-11-19 DIAGNOSIS — Z6831 Body mass index (BMI) 31.0-31.9, adult: Secondary | ICD-10-CM | POA: Diagnosis not present

## 2020-11-19 DIAGNOSIS — I5032 Chronic diastolic (congestive) heart failure: Secondary | ICD-10-CM | POA: Diagnosis not present

## 2020-11-19 DIAGNOSIS — I509 Heart failure, unspecified: Secondary | ICD-10-CM | POA: Diagnosis not present

## 2020-11-19 DIAGNOSIS — E669 Obesity, unspecified: Secondary | ICD-10-CM | POA: Diagnosis present

## 2020-11-19 DIAGNOSIS — E872 Acidosis: Secondary | ICD-10-CM | POA: Diagnosis present

## 2020-11-19 DIAGNOSIS — Z66 Do not resuscitate: Secondary | ICD-10-CM | POA: Diagnosis present

## 2020-11-19 DIAGNOSIS — I132 Hypertensive heart and chronic kidney disease with heart failure and with stage 5 chronic kidney disease, or end stage renal disease: Secondary | ICD-10-CM | POA: Diagnosis present

## 2020-11-19 DIAGNOSIS — E1122 Type 2 diabetes mellitus with diabetic chronic kidney disease: Secondary | ICD-10-CM | POA: Diagnosis present

## 2020-11-19 DIAGNOSIS — I2510 Atherosclerotic heart disease of native coronary artery without angina pectoris: Secondary | ICD-10-CM | POA: Diagnosis present

## 2020-11-19 DIAGNOSIS — N2581 Secondary hyperparathyroidism of renal origin: Secondary | ICD-10-CM | POA: Diagnosis present

## 2020-11-19 DIAGNOSIS — I5033 Acute on chronic diastolic (congestive) heart failure: Secondary | ICD-10-CM | POA: Diagnosis present

## 2020-11-19 DIAGNOSIS — Z955 Presence of coronary angioplasty implant and graft: Secondary | ICD-10-CM | POA: Diagnosis not present

## 2020-11-19 DIAGNOSIS — N189 Chronic kidney disease, unspecified: Secondary | ICD-10-CM | POA: Diagnosis not present

## 2020-11-19 LAB — FERRITIN: Ferritin: 149 ng/mL (ref 11–307)

## 2020-11-19 LAB — CBC
HCT: 23.5 % — ABNORMAL LOW (ref 36.0–46.0)
Hemoglobin: 7.4 g/dL — ABNORMAL LOW (ref 12.0–15.0)
MCH: 29 pg (ref 26.0–34.0)
MCHC: 31.5 g/dL (ref 30.0–36.0)
MCV: 92.2 fL (ref 80.0–100.0)
Platelets: 133 10*3/uL — ABNORMAL LOW (ref 150–400)
RBC: 2.55 MIL/uL — ABNORMAL LOW (ref 3.87–5.11)
RDW: 13.3 % (ref 11.5–15.5)
WBC: 3.9 10*3/uL — ABNORMAL LOW (ref 4.0–10.5)
nRBC: 0 % (ref 0.0–0.2)

## 2020-11-19 LAB — RENAL FUNCTION PANEL
Albumin: 2.8 g/dL — ABNORMAL LOW (ref 3.5–5.0)
Anion gap: 7 (ref 5–15)
BUN: 61 mg/dL — ABNORMAL HIGH (ref 8–23)
CO2: 21 mmol/L — ABNORMAL LOW (ref 22–32)
Calcium: 8.4 mg/dL — ABNORMAL LOW (ref 8.9–10.3)
Chloride: 110 mmol/L (ref 98–111)
Creatinine, Ser: 3.3 mg/dL — ABNORMAL HIGH (ref 0.44–1.00)
GFR, Estimated: 14 mL/min — ABNORMAL LOW (ref >60)
Glucose, Bld: 111 mg/dL — ABNORMAL HIGH (ref 70–99)
Phosphorus: 4.6 mg/dL (ref 2.5–4.6)
Potassium: 4.2 mmol/L (ref 3.5–5.1)
Sodium: 138 mmol/L (ref 135–145)

## 2020-11-19 LAB — GLUCOSE, CAPILLARY
Glucose-Capillary: 128 mg/dL — ABNORMAL HIGH (ref 70–99)
Glucose-Capillary: 142 mg/dL — ABNORMAL HIGH (ref 70–99)
Glucose-Capillary: 185 mg/dL — ABNORMAL HIGH (ref 70–99)
Glucose-Capillary: 111 mg/dL — ABNORMAL HIGH (ref 70–99)

## 2020-11-19 LAB — IRON AND TIBC
Iron: 28 ug/dL (ref 28–170)
Saturation Ratios: 14 % (ref 10.4–31.8)
TIBC: 202 ug/dL — ABNORMAL LOW (ref 250–450)
UIBC: 174 ug/dL

## 2020-11-19 LAB — HEMOGLOBIN A1C
Hgb A1c MFr Bld: 5.4 % (ref 4.8–5.6)
Mean Plasma Glucose: 108 mg/dL

## 2020-11-19 MED ORDER — NEPRO/CARBSTEADY PO LIQD
237.0000 mL | Freq: Two times a day (BID) | ORAL | Status: DC
  Administered 2020-11-19 – 2020-11-24 (×11): 237 mL via ORAL

## 2020-11-19 MED ORDER — AMLODIPINE BESYLATE 10 MG PO TABS
10.0000 mg | ORAL_TABLET | Freq: Every day | ORAL | Status: DC
  Administered 2020-11-19 – 2020-11-24 (×6): 10 mg via ORAL
  Filled 2020-11-19 (×6): qty 1

## 2020-11-19 MED ORDER — SODIUM CHLORIDE 0.9 % IV SOLN
510.0000 mg | Freq: Once | INTRAVENOUS | Status: AC
  Administered 2020-11-19: 510 mg via INTRAVENOUS
  Filled 2020-11-19: qty 17
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
## 2020-11-19 NOTE — Progress Notes (Signed)
Initial Nutrition Assessment  DOCUMENTATION CODES:   Not applicable  INTERVENTION:    Nepro Shake po BID, each supplement provides 425 kcal and 19 grams protein.  Low sodium diet education provided.  NUTRITION DIAGNOSIS:   Increased nutrient needs related to chronic illness (CKD) as evidenced by estimated needs.  GOAL:   Patient will meet greater than or equal to 90% of their needs  MONITOR:   PO intake,Supplement acceptance,Labs  REASON FOR ASSESSMENT:   Consult  (nutritional goals)  ASSESSMENT:   80 yo female admitted with SOB, edema. PMH includes CKD stage 5, DM, HLD, HTN, fibromyalgia, COPD, CAD.   Patient is preparing to start PD in the near future. She states her appetite has been reduced for the past 1-2 weeks due to fluid overload / edema. She typically eats well, but has been eating poorly for the past few days.    Patient agreed to try Nepro shake supplements to increase oral intake of protein and calories. She typically eats a protein shake or protein bar once daily at home.  Reviewed renal diet with patient, focusing on low sodium foods. K and Phos are WNL, no need to limit intake at this time. She has been following a low sodium diet at home for a long time due to HTN. Provided Renal Pyramid handout with list of high sodium foods to avoid. Patient appreciative of information provided. Encouraged her to discuss diet recommendations with the RD at her HD center.   Labs reviewed.  CBG: [phone]  Medications reviewed and include calcitriol, lasix, novolog, lantus, sodium bicarb, Feraheme.  Usual weights reviewed. No weight loss noted, however, edema may be masking weight loss.   NUTRITION - FOCUSED PHYSICAL EXAM:  Flowsheet Row Most Recent Value  Orbital Region No depletion  Upper Arm Region No depletion  Thoracic and Lumbar Region No depletion  Buccal Region No depletion  Temple Region No depletion  Clavicle Bone Region No depletion  Clavicle and  Acromion Bone Region No depletion  Scapular Bone Region No depletion  Dorsal Hand No depletion  Patellar Region No depletion  Anterior Thigh Region No depletion  Posterior Calf Region No depletion  Edema (RD Assessment) Mild  Hair Reviewed  Eyes Reviewed  Mouth Reviewed  Skin Reviewed  Nails Reviewed       Diet Order:   Diet Order            Diet renal/carb modified with fluid restriction Diet-HS Snack? Nothing; Fluid restriction: 1500 mL Fluid; Room service appropriate? Yes; Fluid consistency: Thin  Diet effective now                 EDUCATION NEEDS:   Education needs have been addressed  Skin:  Skin Assessment: Reviewed RN Assessment  Last BM:  5/12 per RN documentation  Height:   Ht Readings from Last 1 Encounters:  11/18/20 5\' 5"  (1.651 m)    Weight:   Wt Readings from Last 1 Encounters:  11/19/20 87.5 kg    BMI:  Body mass index is 32.1 kg/m.  Estimated Nutritional Needs:   Kcal:  2000-2300  Protein:  80-100 gm  Fluid:  1.5-2 L    Lucas Mallow, RD, LDN, CNSC Please refer to Amion for contact information.

## 2020-11-19 NOTE — Progress Notes (Addendum)
New BP at 171/70. O2 saturation at 93 % RA  Pt states she feels better with the Zofran .Will recheck BP  Dr. [place] updated via [facility] text. Pt c/o nausea, dry heaving. Pt just completed her Feraheme IV infusion. New order to go ahead give her Zofran as per PRN order. BP initially at 214 / 87, saturation at 97 %  RA. Will recheck BP
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
## 2020-11-19 NOTE — Progress Notes (Signed)
PROGRESS NOTE    Joyce [name]  DEY:[id number] DOB: July 06, 1940 DOA: 11/18/2020 PCP: Allie Dimmer, MD    Brief Narrative:  Joyce [name] is a 80 year old female with past medical history significant for type 2 diabetes mellitus, hyperlipidemia, essential hypertension, fibromyalgia, COPD, CAD s/p CABG, CKD stage V presenting to [facility] ED on 5/26 with progressive shortness of breath and edema.  Recently seen by her primary nephrologist with change from her Lasix to Bumex last week.  Has been planning to start peritoneal dialysis in the near future.  Patient now where she has difficulty ambulating due to her significant dyspnea and reports urine output has been decreasing.  In the ED, temperature 98.2 F, HR 59, RR 15, BP 192/58, SPO2 97% on room air.  Sodium 139, potassium 4.6, chloride 109, CO2 20, glucose 169, BUN 61, creatinine 3.41.  BNP 1340.  Troponin 18>18.  WBC 5.2, hemoglobin 8.8, platelets 170.  Cova-19 PCR negative.  Influenza A/B PCR negative.  Chest x-ray with findings of progressive pulmonary edema.  Nephrology was consulted. TRH consulted for further evaluation and management of progressive dyspnea 2/2 volume overload from progressive renal failure.   Assessment & Plan:   Principal Problem:   Volume overload Active Problems:   Stage 5 chronic kidney disease (HCC)   Essential hypertension   Diabetes mellitus type 2 in obese (HCC)   S/P CABG x 4   Diastolic CHF, chronic (HCC)   COPD with asthma (HCC)   Fibromyalgia   Volume overload 2/2 progressive CKD stage V Metabolic acidosis Patient presenting to the ED with progressive shortness of breath and decreased urinary output despite recent diuretic change from furosemide to Bumex by outpatient nephrologist.  Patient was in the planning stages to initiate peritoneal dialysis in the near future.  Patient is afebrile without leukocytosis.  Influenza and COVID-19 PCR negative.  BNP elevated with chest x-ray findings of  pulmonary edema.  Renal ultrasound without obstruction with notable medical renal disease. --Nephrology following, appreciate assistance --net negative 1.1L past 24 hours --wt 89.1>87.5kg --Furosemide 80 mg IV every 12 hours --Sodium bicarbonate 650 mg p.o. twice daily --Strict I's and O's and daily weights --BMP daily  Acute on chronic diastolic congestive heart failure. TTE 04/2020 with preserved LVEF and grade 1 diastolic dysfunction.  Elevated BNP and chest x-ray findings consistent with pulmonary edema.  Etiology likely secondary to worsening renal function. --Continue furosemide 80 mg IV every 12 hours --Strict I's and O's and daily weights  Anemia of chronic renal disease Hemoglobin 7.4, MCV 92.2.  Iron 20, TIBC 202, ferritin 149.  Being IV Feraheme today. --CBC daily --Transfuse for hemoglobin < 7.0  CAD s/P CABG --continue Plavix and statin  Essential hypertension --Bisoprolol 5 mg p.o. daily --Hydralazine 100 mg p.o. 3 times daily --Isosorbide mononitrate 60 mg p.o. daily --Amlodipine  HLD: Atorvastatin 40 mg p.o. daily  Type 2 diabetes mellitus Home regimen includes Lantus 30 units subcutaneously daily. --Lantus 15 units subcutaneously daily -- SSI for coverage --CBG before every meal/at bedtime  COPD On Stiolto at home.  --Continue albuterol, Singulair, Flonase  Fibromyalgia Anxiety --Ultram, BuSpar 5 mg p.o. daily as needed, primidone, Requip    DVT prophylaxis: Heparin   Code Status: Full Code Family Communication: Updated patient's daughter Lovie Chol via telephone this morning  Disposition Plan:  Level of care: Telemetry Medical Status is: Observation  The patient remains OBS appropriate and will d/c before 2 midnights.  Dispo: The patient is from: Home  Anticipated d/c is to: Home              Patient currently is not medically stable to d/c.   Difficult to place patient No  Consultants:   Nephrology  Procedures:   Renal  ultrasound  Antimicrobials:   None   Subjective: Patient seen and examined at bedside, resting comfortably.  Sitting at edge of bed.  Working with OT.  Patient states shortness of breath slightly improved, but with continued significant exertional dyspnea.  Reports better urine output overnight with IV Lasix.  No other questions or concerns at this time.  No family present at bedside.  Patient denies headache, no fever/chills/night sweats, no nausea/vomiting/diarrhea, no chest pain, no palpitations, no abdominal pain, no cough/congestion.  No acute events overnight per nursing staff.  Objective: Vitals:   11/19/20 0053 11/19/20 0433 11/19/20 0617 11/19/20 0928  BP: (!) 156/47 (!) 193/67 (!) 172/66 (!) 184/57  Pulse: (!) 57 (!) 55 (!) 58   Resp: 18 15    Temp: 98.1 F (36.7 C) 98.1 F (36.7 C)    TempSrc: Oral Oral    SpO2: 98% 98% 98%   Weight:  87.5 kg    Height:        Intake/Output Summary (Last 24 hours) at 11/19/2020 1103 Last data filed at 11/19/2020 0408 Gross per 24 hour  Intake --  Output 1100 ml  Net -1100 ml   Filed Weights   11/18/20 1822 11/19/20 0433  Weight: 89.1 kg 87.5 kg    Examination:  General exam: Appears calm and comfortable  Respiratory system: Breath sounds slightly decreased bilateral bases, mild crackles, no wheezing, normal [place] effort, on room air Cardiovascular system: S1 & S2 heard, RRR. No JVD, murmurs, rubs, gallops or clicks.  Trace lower extremity edema to mid shin Gastrointestinal system: Abdomen is nondistended, soft and nontender. No organomegaly or masses felt. Normal bowel sounds heard. Central nervous system: Alert and oriented. No focal neurological deficits. Extremities: Symmetric 5 x 5 power. Skin: No rashes, lesions or ulcers Psychiatry: Judgement and insight appear normal. Mood & affect appropriate.     Data Reviewed: I have personally reviewed following labs and imaging studies  CBC: Recent Labs  Lab 11/18/20 1226  11/19/20 0214  WBC 5.2 3.9*  NEUTROABS 4.0  --   HGB 8.8* 7.4*  HCT 28.8* 23.5*  MCV 95.4 92.2  PLT 170 831*   Basic Metabolic Panel: Recent Labs  Lab 11/18/20 1226 11/19/20 0214  NA 139 138  K 4.6 4.2  CL 109 110  CO2 20* 21*  GLUCOSE 169* 111*  BUN 61* 61*  CREATININE 3.41* 3.30*  CALCIUM 8.6* 8.4*  PHOS  --  4.6   GFR: Estimated Creatinine Clearance: 15.6 mL/min (A) (by C-G formula based on SCr of 3.3 mg/dL (H)). Liver Function Tests: Recent Labs  Lab 11/18/20 1226 11/19/20 0214  AST 25  --   ALT 29  --   ALKPHOS 90  --   BILITOT 0.9  --   PROT 5.7*  --   ALBUMIN 3.2* 2.8*   No results for input(s): LIPASE, AMYLASE in the last 168 hours. No results for input(s): AMMONIA in the last 168 hours. Coagulation Profile: No results for input(s): INR, PROTIME in the last 168 hours. Cardiac Enzymes: No results for input(s): CKTOTAL, CKMB, CKMBINDEX, TROPONINI in the last 168 hours. BNP (last 3 results) No results for input(s): PROBNP in the last 8760 hours. HbA1C: Recent Labs    11/18/20 1824  HGBA1C 5.4   CBG: Recent Labs  Lab 11/18/20 1809 11/18/20 2116 11/19/20 0817  GLUCAP 106* 169* 128*   Lipid Profile: No results for input(s): CHOL, HDL, LDLCALC, TRIG, CHOLHDL, LDLDIRECT in the last 72 hours. Thyroid Function Tests: No results for input(s): TSH, T4TOTAL, FREET4, T3FREE, THYROIDAB in the last 72 hours. Anemia Panel: Recent Labs    11/19/20 0214  FERRITIN 149  TIBC 202*  IRON 28   Sepsis Labs: No results for input(s): PROCALCITON, LATICACIDVEN in the last 168 hours.  Recent Results (from the past 240 hour(s))  Resp Panel by RT-PCR (Flu A&B, Covid) Nasopharyngeal Swab     Status: None   Collection Time: 11/18/20  1:28 PM   Specimen: Nasopharyngeal Swab; Nasopharyngeal(NP) swabs in vial transport medium  Result Value Ref Range Status   SARS Coronavirus 2 by RT PCR NEGATIVE NEGATIVE Final    Comment: (NOTE) SARS-CoV-2 target nucleic acids  are NOT DETECTED.  The SARS-CoV-2 RNA is generally detectable in upper respiratory specimens during the acute phase of infection. The lowest concentration of SARS-CoV-2 viral copies this assay can detect is 138 copies/mL. A negative result does not preclude SARS-Cov-2 infection and should not be used as the sole basis for treatment or other patient management decisions. A negative result may occur with  improper specimen collection/handling, submission of specimen other than nasopharyngeal swab, presence of viral mutation(s) within the areas targeted by this assay, and inadequate number of viral copies(<138 copies/mL). A negative result must be combined with clinical observations, patient history, and epidemiological information. The expected result is Negative.  Fact Sheet for Patients:  EntrepreneurPulse.com.au  Fact Sheet for Healthcare Providers:  IncredibleEmployment.be  This test is no t yet approved or cleared by the [place] FDA and  has been authorized for detection and/or diagnosis of SARS-CoV-2 by FDA under an Emergency Use Authorization (EUA). This EUA will remain  in effect (meaning this test can be used) for the duration of the COVID-19 declaration under Section 564(b)(1) of the Act, 21 U.S.C.section 360bbb-3(b)(1), unless the authorization is terminated  or revoked sooner.       Influenza A by PCR NEGATIVE NEGATIVE Final   Influenza B by PCR NEGATIVE NEGATIVE Final    Comment: (NOTE) The Xpert Xpress SARS-CoV-2/FLU/RSV plus assay is intended as an aid in the diagnosis of influenza from Nasopharyngeal swab specimens and should not be used as a sole basis for treatment. Nasal washings and aspirates are unacceptable for Xpert Xpress SARS-CoV-2/FLU/RSV testing.  Fact Sheet for Patients: EntrepreneurPulse.com.au  Fact Sheet for Healthcare Providers: IncredibleEmployment.be  This test is  not yet approved or cleared by the [place] FDA and has been authorized for detection and/or diagnosis of SARS-CoV-2 by FDA under an Emergency Use Authorization (EUA). This EUA will remain in effect (meaning this test can be used) for the duration of the COVID-19 declaration under Section 564(b)(1) of the Act, 21 U.S.C. section 360bbb-3(b)(1), unless the authorization is terminated or revoked.  Performed at [facility], [place] [address] [place], [place] [address]          Radiology Studies: DG Chest 2 View  Result Date: 11/18/2020 CLINICAL DATA:  Shortness of breath. EXAM: CHEST - 2 VIEW COMPARISON:  06/23/2020. FINDINGS: Prior CABG. Cardiomegaly. Diffuse bilateral interstitial prominence with Kerley B-lines, progressed from prior exam. Small bilateral pleural effusions. Findings most consistent with CHF. Pneumonitis cannot be excluded. No pneumothorax. Prior cervical spine fusion. IMPRESSION: Findings consistent CHF with progressive pulmonary interstitial edema. Electronically  Signed   ByMarcello Moores  Register   On: 11/18/2020 12:58   US RENAL  Result Date: 11/18/2020 CLINICAL DATA:  Oliguria. EXAM: RENAL / URINARY TRACT ULTRASOUND COMPLETE COMPARISON:  CT 05/13/2019. FINDINGS: Right Kidney: Renal measurements: 12.9 x 5.4 x 5.2 cm = volume: 192 mL. Mild thinning of renal parenchyma and increased parenchymal echogenicity. No hydronephrosis. No focal lesion, previous tiny stone on CT is not seen. Left Kidney: Renal measurements: 11.0 x 6.2 x 5.3 cm = volume: 192 mL. Mild thinning of renal parenchyma and increased parenchymal echogenicity. No hydronephrosis, focal lesion or stone. Bladder: Appears normal for degree of bladder distention. Other: None. IMPRESSION: 1. No obstructive uropathy. 2. Mild thinning of renal parenchyma and increased echogenicity consistent with chronic medical renal disease. Electronically Signed   By: Keith Rake M.D.   On: 11/18/2020 17:00         Scheduled Meds: . atorvastatin  40 mg Oral QPM  . bisoprolol  5 mg Oral Daily  . calcitRIOL  0.25 mcg Oral Daily  . clopidogrel  75 mg Oral Q breakfast  . feeding supplement (NEPRO CARB STEADY)  237 mL Oral BID BM  . fluticasone  1 spray Each Nare Daily  . furosemide  80 mg Intravenous BID  . heparin  5,000 Units Subcutaneous Q8H  . hydrALAZINE  100 mg Oral TID  . insulin aspart  0-6 Units Subcutaneous TID WC  . insulin glargine  15 Units Subcutaneous QHS  . isosorbide mononitrate  60 mg Oral Daily  . montelukast  10 mg Oral QHS  . primidone  100 mg Oral Daily  . sodium bicarbonate  650 mg Oral BID  . sodium chloride flush  3 mL Intravenous Q12H  . valACYclovir  1,000 mg Oral BID   Continuous Infusions: . ferumoxytol       LOS: 0 days    Time spent: 42 minutes spent on chart review, discussion with nursing staff, consultants, updating family and interview/physical exam; more than 50% of that time was spent in counseling and/or coordination of care.    Fusako Tanabe J [place] DO [facility] Available via Epic secure chat 7am-7pm After these hours, please refer to coverage provider listed on [email or web address] 11/19/2020, 11:03 AM

## 2020-11-19 NOTE — Progress Notes (Signed)
Heart Failure Navigator Progress Note  Assessed for Heart & Vascular TOC clinic readiness.  Unfortunately at this time the patient does not meet criteria due to CKD V.   Navigator available for reassessment of patient.   Kerby Nora, PharmD, BCPS Heart Failure Stewardship Pharmacist Phone [phone]

## 2020-11-20 DIAGNOSIS — I5032 Chronic diastolic (congestive) heart failure: Secondary | ICD-10-CM | POA: Diagnosis not present

## 2020-11-20 DIAGNOSIS — J449 Chronic obstructive pulmonary disease, unspecified: Secondary | ICD-10-CM | POA: Diagnosis not present

## 2020-11-20 DIAGNOSIS — E1169 Type 2 diabetes mellitus with other specified complication: Secondary | ICD-10-CM | POA: Diagnosis not present

## 2020-11-20 DIAGNOSIS — E8770 Fluid overload, unspecified: Secondary | ICD-10-CM | POA: Diagnosis not present

## 2020-11-20 LAB — CBC
HCT: 23.9 % — ABNORMAL LOW (ref 36.0–46.0)
Hemoglobin: 7.6 g/dL — ABNORMAL LOW (ref 12.0–15.0)
MCH: 29.3 pg (ref 26.0–34.0)
MCHC: 31.8 g/dL (ref 30.0–36.0)
MCV: 92.3 fL (ref 80.0–100.0)
Platelets: 131 10*3/uL — ABNORMAL LOW (ref 150–400)
RBC: 2.59 MIL/uL — ABNORMAL LOW (ref 3.87–5.11)
RDW: 13.2 % (ref 11.5–15.5)
WBC: 3.6 10*3/uL — ABNORMAL LOW (ref 4.0–10.5)
nRBC: 0 % (ref 0.0–0.2)

## 2020-11-20 LAB — GLUCOSE, CAPILLARY
Glucose-Capillary: 137 mg/dL — ABNORMAL HIGH (ref 70–99)
Glucose-Capillary: 189 mg/dL — ABNORMAL HIGH (ref 70–99)
Glucose-Capillary: 138 mg/dL — ABNORMAL HIGH (ref 70–99)
Glucose-Capillary: 118 mg/dL — ABNORMAL HIGH (ref 70–99)

## 2020-11-20 LAB — RENAL FUNCTION PANEL
Albumin: 2.7 g/dL — ABNORMAL LOW (ref 3.5–5.0)
Anion gap: 6 (ref 5–15)
BUN: 63 mg/dL — ABNORMAL HIGH (ref 8–23)
CO2: 22 mmol/L (ref 22–32)
Calcium: 8.5 mg/dL — ABNORMAL LOW (ref 8.9–10.3)
Chloride: 111 mmol/L (ref 98–111)
Creatinine, Ser: 3.13 mg/dL — ABNORMAL HIGH (ref 0.44–1.00)
GFR, Estimated: 15 mL/min — ABNORMAL LOW (ref >60)
Glucose, Bld: 90 mg/dL (ref 70–99)
Phosphorus: 5.3 mg/dL — ABNORMAL HIGH (ref 2.5–4.6)
Potassium: 4.3 mmol/L (ref 3.5–5.1)
Sodium: 139 mmol/L (ref 135–145)

## 2020-11-20 MED ORDER — FUROSEMIDE 10 MG/ML IJ SOLN
80.0000 mg | Freq: Two times a day (BID) | INTRAMUSCULAR | Status: DC
  Administered 2020-11-20 – 2020-11-23 (×7): 80 mg via INTRAVENOUS
  Filled 2020-11-20 (×7): qty 8

## 2020-11-20 MED ORDER — CLONIDINE HCL 0.2 MG/24HR TD PTWK: 0.2000 mg | MEDICATED_PATCH | TRANSDERMAL | Status: DC

## 2020-11-20 MED ORDER — CLONIDINE HCL 0.1 MG/24HR TD PTWK
0.1000 mg | MEDICATED_PATCH | TRANSDERMAL | Status: DC
  Administered 2020-11-20: 0.1 mg via TRANSDERMAL
  Filled 2020-11-20: qty 1

## 2020-11-20 NOTE — Progress Notes (Signed)
PROGRESS NOTE    Joyce [name]  KWI:[id number] DOB: 08-31-1940 DOA: 11/18/2020 PCP: Allie Dimmer, MD    Brief Narrative:  Joyce [name] is a 80 year old female with past medical history significant for type 2 diabetes mellitus, hyperlipidemia, essential hypertension, fibromyalgia, COPD, CAD s/p CABG, CKD stage V presenting to [facility] ED on 5/26 with progressive shortness of breath and edema.  Recently seen by her primary nephrologist with change from her Lasix to Bumex last week.  Has been planning to start peritoneal dialysis in the near future.  Patient now where she has difficulty ambulating due to her significant dyspnea and reports urine output has been decreasing.  In the ED, temperature 98.2 F, HR 59, RR 15, BP 192/58, SPO2 97% on room air.  Sodium 139, potassium 4.6, chloride 109, CO2 20, glucose 169, BUN 61, creatinine 3.41.  BNP 1340.  Troponin 18>18.  WBC 5.2, hemoglobin 8.8, platelets 170.  Cova-19 PCR negative.  Influenza A/B PCR negative.  Chest x-ray with findings of progressive pulmonary edema.  Nephrology was consulted. TRH consulted for further evaluation and management of progressive dyspnea 2/2 volume overload from progressive renal failure.   Assessment & Plan:   Principal Problem:   Volume overload Active Problems:   Stage 5 chronic kidney disease (HCC)   Essential hypertension   Diabetes mellitus type 2 in obese (HCC)   S/P CABG x 4   Diastolic CHF, chronic (HCC)   COPD with asthma (HCC)   Fibromyalgia   Volume overload 2/2 progressive CKD stage V Metabolic acidosis Patient presenting to the ED with progressive shortness of breath and decreased urinary output despite recent diuretic change from furosemide to Bumex by outpatient nephrologist.  Patient was in the planning stages to initiate peritoneal dialysis in the near future.  Patient is afebrile without leukocytosis.  Influenza and COVID-19 PCR negative.  BNP elevated with chest x-ray findings of  pulmonary edema.  Renal ultrasound without obstruction with notable medical renal disease. --Nephrology following, appreciate assistance --net negative 1.2L past 24 hours, net negative 2.4L since admission --wt 89.1>87.5>86.5kg --Furosemide 80 mg IV q12h --Sodium bicarbonate 650 mg p.o. twice daily --Strict I's and O's and daily weights --BMP daily  Acute on chronic diastolic congestive heart failure. TTE 04/2020 with preserved LVEF and grade 1 diastolic dysfunction.  Elevated BNP and chest x-ray findings consistent with pulmonary edema.  Etiology likely secondary to worsening renal function. --Continue furosemide 80 mg IV q12h --Strict I's and O's and daily weights  Anemia of chronic renal disease Hemoglobin 7.4, MCV 92.2.  Iron 20, TIBC 202, ferritin 149.  Being IV Feraheme today. --CBC daily --Transfuse for hemoglobin < 7.0  CAD s/P CABG --continue Plavix and statin  Essential hypertension BP remains poorly controlled, 190/55 this morning.  Heart rate 56. --Bisoprolol 5 mg p.o. daily --Hydralazine 100 mg p.o. 3 times daily --Isosorbide mononitrate 60 mg p.o. daily --Amlodipine 10 mg p.o. daily --Started clonidine patch 0.1 mg --Continue Lasix as above  HLD: Atorvastatin 40 mg p.o. daily  Type 2 diabetes mellitus Home regimen includes Lantus 30 units subcutaneously daily. --Lantus 15 units subcutaneously daily --SSI for coverage --CBG before every meal/at bedtime  COPD On Stiolto at home.  --Continue albuterol, Singulair, Flonase  Fibromyalgia Anxiety --Ultram, BuSpar 5 mg p.o. daily as needed, primidone, Requip    DVT prophylaxis: Heparin   Code Status: Full Code Family Communication: Updated patient's daughter Lovie Chol yesterday  Disposition Plan:  Level of care: Telemetry Medical Status is: Inpatient  Remains inpatient appropriate because:Unsafe d/c plan, IV treatments appropriate due to intensity of illness or inability to take PO and Inpatient level  of care appropriate due to severity of illness   Dispo: The patient is from: Home              Anticipated d/c is to: Home              Patient currently is not medically stable to d/c.   Difficult to place patient No   Consultants:   Nephrology  Procedures:   Renal ultrasound  Antimicrobials:   None   Subjective: Patient seen and examined at bedside, resting comfortably.  Lying in bed.  No family present.  Continues with current urine output.  Continues with dyspnea, but improved at rest.  Significant dyspnea with minimal exertion remains.  No other questions or concerns at this time.  Patient denies headache, no fever/chills/night sweats, no nausea/vomiting/diarrhea, no chest pain, no palpitations, no abdominal pain, no cough/congestion.  No acute events overnight per nursing staff.  Objective: Vitals:   11/20/20 0620 11/20/20 0738 11/20/20 1054 11/20/20 1132  BP: (!) 190/55 (!) 142/82 (!) 150/47 (!) 166/50  Pulse: (!) 56 66 (!) 56 60  Resp:   18 18  Temp:    98.9 F (37.2 C)  TempSrc:    Oral  SpO2: 98% 98% 97% 97%  Weight:      Height:        Intake/Output Summary (Last 24 hours) at 11/20/2020 1358 Last data filed at 11/20/2020 1100 Gross per 24 hour  Intake 480 ml  Output 2300 ml  Net -1820 ml   Filed Weights   11/18/20 1822 11/19/20 0433 11/20/20 0520  Weight: 89.1 kg 87.5 kg 86.5 kg    Examination:  General exam: Appears calm and comfortable  Respiratory system: Breath sounds slightly decreased bilateral bases, mild crackles, no wheezing, normal respiratory effort, 2 L nasal cannula with SPO2 98% at rest Cardiovascular system: S1 & S2 heard, RRR. No JVD, murmurs, rubs, gallops or clicks.  Trace lower extremity edema to mid shin Gastrointestinal system: Abdomen is nondistended, soft and nontender. No organomegaly or masses felt. Normal bowel sounds heard. Central nervous system: Alert and oriented. No focal neurological deficits. Extremities: Symmetric 5  x 5 power. Skin: No rashes, lesions or ulcers Psychiatry: Judgement and insight appear normal. Mood & affect appropriate.     Data Reviewed: I have personally reviewed following labs and imaging studies  CBC: Recent Labs  Lab 11/18/20 1226 11/19/20 0214 11/20/20 0131  WBC 5.2 3.9* 3.6*  NEUTROABS 4.0  --   --   HGB 8.8* 7.4* 7.6*  HCT 28.8* 23.5* 23.9*  MCV 95.4 92.2 92.3  PLT 170 133* 509*   Basic Metabolic Panel: Recent Labs  Lab 11/18/20 1226 11/19/20 0214 11/20/20 0131  NA 139 138 139  K 4.6 4.2 4.3  CL 109 110 111  CO2 20* 21* 22  GLUCOSE 169* 111* 90  BUN 61* 61* 63*  CREATININE 3.41* 3.30* 3.13*  CALCIUM 8.6* 8.4* 8.5*  PHOS  --  4.6 5.3*   GFR: Estimated Creatinine Clearance: 16.3 mL/min (A) (by C-G formula based on SCr of 3.13 mg/dL (H)). Liver Function Tests: Recent Labs  Lab 11/18/20 1226 11/19/20 0214 11/20/20 0131  AST 25  --   --   ALT 29  --   --   ALKPHOS 90  --   --   BILITOT 0.9  --   --  PROT 5.7*  --   --   ALBUMIN 3.2* 2.8* 2.7*   No results for input(s): LIPASE, AMYLASE in the last 168 hours. No results for input(s): AMMONIA in the last 168 hours. Coagulation Profile: No results for input(s): INR, PROTIME in the last 168 hours. Cardiac Enzymes: No results for input(s): CKTOTAL, CKMB, CKMBINDEX, TROPONINI in the last 168 hours. BNP (last 3 results) No results for input(s): PROBNP in the last 8760 hours. HbA1C: Recent Labs    11/18/20 1824  HGBA1C 5.4   CBG: Recent Labs  Lab 11/19/20 1102 11/19/20 1650 11/19/20 2218 11/20/20 0742 11/20/20 1128  GLUCAP 142* 185* 111* 137* 189*   Lipid Profile: No results for input(s): CHOL, HDL, LDLCALC, TRIG, CHOLHDL, LDLDIRECT in the last 72 hours. Thyroid Function Tests: No results for input(s): TSH, T4TOTAL, FREET4, T3FREE, THYROIDAB in the last 72 hours. Anemia Panel: Recent Labs    11/19/20 0214  FERRITIN 149  TIBC 202*  IRON 28   Sepsis Labs: No results for input(s):  PROCALCITON, LATICACIDVEN in the last 168 hours.  Recent Results (from the past 240 hour(s))  Resp Panel by RT-PCR (Flu A&B, Covid) Nasopharyngeal Swab     Status: None   Collection Time: 11/18/20  1:28 PM   Specimen: Nasopharyngeal Swab; Nasopharyngeal(NP) swabs in vial transport medium  Result Value Ref Range Status   SARS Coronavirus 2 by RT PCR NEGATIVE NEGATIVE Final    Comment: (NOTE) SARS-CoV-2 target nucleic acids are NOT DETECTED.  The SARS-CoV-2 RNA is generally detectable in upper respiratory specimens during the acute phase of infection. The lowest concentration of SARS-CoV-2 viral copies this assay can detect is 138 copies/mL. A negative result does not preclude SARS-Cov-2 infection and should not be used as the sole basis for treatment or other patient management decisions. A negative result may occur with  improper specimen collection/handling, submission of specimen other than nasopharyngeal swab, presence of viral mutation(s) within the areas targeted by this assay, and inadequate number of viral copies(<138 copies/mL). A negative result must be combined with clinical observations, patient history, and epidemiological information. The expected result is Negative.  Fact Sheet for Patients:  EntrepreneurPulse.com.au  Fact Sheet for Healthcare Providers:  IncredibleEmployment.be  This test is no t yet approved or cleared by the [place] FDA and  has been authorized for detection and/or diagnosis of SARS-CoV-2 by FDA under an Emergency Use Authorization (EUA). This EUA will remain  in effect (meaning this test can be used) for the duration of the COVID-19 declaration under Section 564(b)(1) of the Act, 21 U.S.C.section 360bbb-3(b)(1), unless the authorization is terminated  or revoked sooner.       Influenza A by PCR NEGATIVE NEGATIVE Final   Influenza B by PCR NEGATIVE NEGATIVE Final    Comment: (NOTE) The Xpert Xpress  SARS-CoV-2/FLU/RSV plus assay is intended as an aid in the diagnosis of influenza from Nasopharyngeal swab specimens and should not be used as a sole basis for treatment. Nasal washings and aspirates are unacceptable for Xpert Xpress SARS-CoV-2/FLU/RSV testing.  Fact Sheet for Patients: EntrepreneurPulse.com.au  Fact Sheet for Healthcare Providers: IncredibleEmployment.be  This test is not yet approved or cleared by the [place] FDA and has been authorized for detection and/or diagnosis of SARS-CoV-2 by FDA under an Emergency Use Authorization (EUA). This EUA will remain in effect (meaning this test can be used) for the duration of the COVID-19 declaration under Section 564(b)(1) of the Act, 21 U.S.C. section 360bbb-3(b)(1), unless the authorization is terminated  or revoked.  Performed at [facility], [place] [address], [place], [place] [address]          Radiology Studies: US RENAL  Result Date: 11/18/2020 CLINICAL DATA:  Oliguria. EXAM: RENAL / URINARY TRACT ULTRASOUND COMPLETE COMPARISON:  CT 05/13/2019. FINDINGS: Right Kidney: Renal measurements: 12.9 x 5.4 x 5.2 cm = volume: 192 mL. Mild thinning of renal parenchyma and increased parenchymal echogenicity. No hydronephrosis. No focal lesion, previous tiny stone on CT is not seen. Left Kidney: Renal measurements: 11.0 x 6.2 x 5.3 cm = volume: 192 mL. Mild thinning of renal parenchyma and increased parenchymal echogenicity. No hydronephrosis, focal lesion or stone. Bladder: Appears normal for degree of bladder distention. Other: None. IMPRESSION: 1. No obstructive uropathy. 2. Mild thinning of renal parenchyma and increased echogenicity consistent with chronic medical renal disease. Electronically Signed   By: Keith Rake M.D.   On: 11/18/2020 17:00        Scheduled Meds: . amLODipine  10 mg Oral Daily  . atorvastatin  40 mg Oral QPM  . bisoprolol  5 mg Oral Daily  .  calcitRIOL  0.25 mcg Oral Daily  . cloNIDine  0.1 mg Transdermal Weekly  . clopidogrel  75 mg Oral Q breakfast  . feeding supplement (NEPRO CARB STEADY)  237 mL Oral BID BM  . fluticasone  1 spray Each Nare Daily  . furosemide  80 mg Intravenous BID  . heparin  5,000 Units Subcutaneous Q8H  . hydrALAZINE  100 mg Oral TID  . insulin aspart  0-6 Units Subcutaneous TID WC  . insulin glargine  15 Units Subcutaneous QHS  . isosorbide mononitrate  60 mg Oral Daily  . montelukast  10 mg Oral QHS  . primidone  100 mg Oral Daily  . sodium bicarbonate  650 mg Oral BID  . sodium chloride flush  3 mL Intravenous Q12H  . valACYclovir  1,000 mg Oral BID   Continuous Infusions:    LOS: 1 day    Time spent: 37 minutes spent on chart review, discussion with nursing staff, consultants, updating family and interview/physical exam; more than 50% of that time was spent in counseling and/or coordination of care.    Yee Joss J [place] DO [facility] Available via Epic secure chat 7am-7pm After these hours, please refer to coverage provider listed on [email or web address] 11/20/2020, 1:58 PM

## 2020-11-20 NOTE — Progress Notes (Signed)
BP 191/61, HR 57, Hydralazine 5 mg IV given per prn order for SBP >146mmhg.  Will continue to monitor.

## 2020-11-20 NOTE — Progress Notes (Signed)
Repeat BP 181/58, HR 56 bpm. Dr. [place] notified.  See new orders.

## 2020-11-20 NOTE — Progress Notes (Signed)
[facility] Progress Note  Name: Joyce [name] MRN: [id number] DOB: [birth year]   Subjective:  she had 1.7 liters UOP over 5/27.  She has been on lasix 80 mg IV BID.  She has been on 2 liters oxygen.  Not on oxygen at home.  She thought that she had gotten iv iron before.   Review of systems:   Reports shortness of breath with prolonged speech  Had chest discomfort, nausea and dry heaving after IV iron; no n/v now and eating lunch   Denies chest pain currently or today  ------ Background on consult: Joyce [name] is a 80 y.o. 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 Lamonte Sakai in pulmonary.  She also appears quite anxious and has chronic pain from fibromyalgia.  She has CKD that has progressed.  Followed by a Dr. Nilda Calamity in [place].  crt seems to have progressed-  Was 2.7 in December, 2.9 in January and most recetnly over 3 indicating  GFR in the low teens so she was sent for dialysis education which she just had yesterday-is considering PD but does not have all of the information just yet.  Patient reports that over the last week she has become more short of breath and has noted over the last 3 days drop in urine output.  She called her nephrologist with these complaints, was told to double up on her diuretic which she did but urine output did not improve.  She is wavering with her complaint but has told some providers that she has made absolutely no urine since yesterday.  She has edema and chest x-ray is consistent with CHF.  She also complains of itching but has not had any appetite disturbance.  Labs of note include BUN and creatinine 61 and 3.4, potassium 4.6, bicarb 20, albumin 3.2, hemoglobin 8.8, BNP 1300.  Interestingly, pulse ox is greater than 95 on room air  Intake/Output Summary (Last 24 hours) at  11/20/2020 1235 Last data filed at 11/20/2020 1100 Gross per 24 hour  Intake 480 ml  Output 2300 ml  Net -1820 ml    Vitals:  Vitals:   11/20/20 0620 11/20/20 0738 11/20/20 1054 11/20/20 1132  BP: (!) 190/55 (!) 142/82 (!) 150/47 (!) 166/50  Pulse: (!) 56 66 (!) 56 60  Resp:   18 18  Temp:    98.9 F (37.2 C)  TempSrc:    Oral  SpO2: 98% 98% 97% 97%  Weight:      Height:         Physical Exam:   General adult female in bed in no acute distress HEENT normocephalic atraumatic extraocular movements intact sclera anicteric Neck supple trachea midline Lungs few basilar crackles; normal work of breathing at rest; on 2 liters oxygen Heart regular rate and rhythm no rubs or gallops appreciated Abdomen soft nontender  Slightly distended Extremities trace pedal edema  Psych normal mood and affect Neuro - alert and oriented x 3 provides hx and follows commands  Medications reviewed   Labs:  BMP Latest Ref Rng & Units 11/20/2020 11/19/2020 11/18/2020  Glucose 70 - 99 mg/dL 90 111(H) 169(H)  BUN 8 - 23 mg/dL 63(H) 61(H) 61(H)  Creatinine 0.44 - 1.00 mg/dL 3.13(H) 3.30(H) 3.41(H)  Sodium 135 - 145 mmol/L 139 138 139  Potassium 3.5 - 5.1 mmol/L 4.3 4.2 4.6  Chloride 98 - 111 mmol/L  111 110 109  CO2 22 - 32 mmol/L 22 21(L) 20(L)  Calcium 8.9 - 10.3 mg/dL 8.5(L) 8.4(L) 8.6(L)     Assessment/Plan:   1. AKI on CKD IV.  She does not appear to be having overwhelming uremic symptoms.  She is responding to lasix.  UA proteinuria and 0-5 RBC.  Renal ultrasound with no hydro  - continue lasix - 80 mg IV BID for now  2. Hypertension - optimize volume with lasix.  nte she was started on clonidine patch. Continue other regimen  3. Anemia CKD  -she seems more anemic than she has been in the past.  This could be leading to subjective feelings of shortness of breath.  Iron a bit low - s/p feraheme once on 5/27 which she tolerated poorly - n/v/chest discomfort.  Would not use again.  Note she got  aranesp 150 mcg on 5/26.  May need PRBC's soon   4.  metabolic bone disease -continue calcitriol   5.  Metabolic acidosis- on bicarb outpatient - continue here  dispo - continue inpatient monitoring   Claudia Desanctis, MD 11/20/2020 12:51 PM

## 2020-11-20 NOTE — Progress Notes (Signed)
Mobility Specialist - Progress Note   11/20/20 1317  Mobility  Activity Ambulated in hall  Level of Assistance Standby assist, set-up cues, supervision of patient - no hands on  Assistive Device None  Distance Ambulated (ft) 70 ft (35 ft x 1, 20 ft x 1, 15 ft x 1)  Mobility Ambulated with assistance in hallway  Mobility Response Tolerated fair  Mobility performed by Mobility specialist  $Mobility charge 1 Mobility   Pt required 2 seated rest breaks in the intervals above due to 3/4 DOE. Her SpO2 was 94% on 3L O2 while walking. She expressed interest in using a rollator. Pt to recliner after walk, call bell at side.   Pricilla Handler Mobility Specialist Mobility Specialist Phone: [phone]

## 2020-11-21 DIAGNOSIS — E1169 Type 2 diabetes mellitus with other specified complication: Secondary | ICD-10-CM | POA: Diagnosis not present

## 2020-11-21 DIAGNOSIS — E8770 Fluid overload, unspecified: Secondary | ICD-10-CM | POA: Diagnosis not present

## 2020-11-21 DIAGNOSIS — J449 Chronic obstructive pulmonary disease, unspecified: Secondary | ICD-10-CM | POA: Diagnosis not present

## 2020-11-21 DIAGNOSIS — I5032 Chronic diastolic (congestive) heart failure: Secondary | ICD-10-CM | POA: Diagnosis not present

## 2020-11-21 LAB — RENAL FUNCTION PANEL
Albumin: 2.9 g/dL — ABNORMAL LOW (ref 3.5–5.0)
Anion gap: 9 (ref 5–15)
BUN: 64 mg/dL — ABNORMAL HIGH (ref 8–23)
CO2: 24 mmol/L (ref 22–32)
Calcium: 8.5 mg/dL — ABNORMAL LOW (ref 8.9–10.3)
Chloride: 106 mmol/L (ref 98–111)
Creatinine, Ser: 3.13 mg/dL — ABNORMAL HIGH (ref 0.44–1.00)
GFR, Estimated: 15 mL/min — ABNORMAL LOW (ref >60)
Glucose, Bld: 130 mg/dL — ABNORMAL HIGH (ref 70–99)
Phosphorus: 4.7 mg/dL — ABNORMAL HIGH (ref 2.5–4.6)
Potassium: 4.1 mmol/L (ref 3.5–5.1)
Sodium: 139 mmol/L (ref 135–145)

## 2020-11-21 LAB — GLUCOSE, CAPILLARY
Glucose-Capillary: 137 mg/dL — ABNORMAL HIGH (ref 70–99)
Glucose-Capillary: 213 mg/dL — ABNORMAL HIGH (ref 70–99)
Glucose-Capillary: 117 mg/dL — ABNORMAL HIGH (ref 70–99)
Glucose-Capillary: 172 mg/dL — ABNORMAL HIGH (ref 70–99)

## 2020-11-21 MED ORDER — METOLAZONE 5 MG PO TABS
5.0000 mg | ORAL_TABLET | Freq: Once | ORAL | Status: AC
  Administered 2020-11-21: 5 mg via ORAL
  Filled 2020-11-21: qty 1

## 2020-11-21 NOTE — Progress Notes (Signed)
PROGRESS NOTE    Joyce [name]  AXK:[id number] DOB: [birth year] DOA: 11/18/2020 PCP: Allie Dimmer, MD    Brief Narrative:  Joyce [name] is a 80 year old female with past medical history significant for type 2 diabetes mellitus, hyperlipidemia, essential hypertension, fibromyalgia, COPD, CAD s/p CABG, CKD stage V presenting to [facility], ED on 5/26 with progressive shortness of breath and edema.  Recently seen by her primary nephrologist with change from her Lasix to Bumex last week.  Has been planning to start peritoneal dialysis in the near future.  Patient now where she has difficulty ambulating due to her significant dyspnea and reports urine output has been decreasing.  In the ED, temperature 98.2 F, HR 59, RR 15, BP 192/58, SPO2 97% on room air.  Sodium 139, potassium 4.6, chloride 109, CO2 20, glucose 169, BUN 61, creatinine 3.41.  BNP 1340.  Troponin 18>18.  WBC 5.2, hemoglobin 8.8, platelets 170.  Cova-19 PCR negative.  Influenza A/B PCR negative.  Chest x-ray with findings of progressive pulmonary edema.  Nephrology was consulted. TRH consulted for further evaluation and management of progressive dyspnea 2/2 volume overload from progressive renal failure.   Assessment & Plan:   Principal Problem:   Volume overload Active Problems:   Stage 5 chronic kidney disease (HCC)   Essential hypertension   Diabetes mellitus type 2 in obese (HCC)   S/P CABG x 4   Diastolic CHF, chronic (HCC)   COPD with asthma (HCC)   Fibromyalgia   Volume overload 2/2 progressive CKD stage V Metabolic acidosis Patient presenting to the ED with progressive shortness of breath and decreased urinary output despite recent diuretic change from furosemide to Bumex by outpatient nephrologist.  Patient was in the planning stages to initiate peritoneal dialysis in the near future.  Patient is afebrile without leukocytosis.  Influenza and COVID-19 PCR negative.  BNP elevated with chest x-ray findings of  pulmonary edema.  Renal ultrasound without obstruction with notable medical renal disease. --Nephrology following, appreciate assistance --net negative 964mL past 24 hours, net negative 3.5L since admission --wt 89.1>87.5>86.5>86.1kg --Furosemide 80 mg IV q12h --Sodium bicarbonate 650 mg p.o. twice daily --Strict I's and O's and daily weights --BMP daily  Acute on chronic diastolic congestive heart failure. TTE 04/2020 with preserved LVEF and grade 1 diastolic dysfunction.  Elevated BNP and chest x-ray findings consistent with pulmonary edema.  Etiology likely secondary to worsening renal function. --Continue furosemide 80 mg IV q12h --Strict I's and O's and daily weights  Anemia of chronic renal disease Hemoglobin 7.4, MCV 92.2.  Iron 20, TIBC 202, ferritin 149.  Being IV Feraheme today. --CBC daily --Transfuse for hemoglobin < 7.0  CAD s/P CABG --continue Plavix and statin  Essential hypertension BP remains poorly controlled, 190/55 this morning.  Heart rate 56. --Bisoprolol 5 mg p.o. daily --Hydralazine 100 mg p.o. TID --Isosorbide mononitrate 60 mg p.o. daily --Amlodipine 10 mg p.o. daily --Started clonidine patch 0.2 mg --Continue Lasix as above  HLD: Atorvastatin 40 mg p.o. daily  Type 2 diabetes mellitus Home regimen includes Lantus 30 units subcutaneously daily. --Lantus 15 units subcutaneously daily --SSI for coverage --CBG before every meal/at bedtime  COPD On Stiolto at home.  --Continue albuterol, Singulair, Flonase  Fibromyalgia Anxiety --Ultram, BuSpar 5 mg p.o. daily as needed, primidone, Requip    DVT prophylaxis: Heparin   Code Status: Full Code Family Communication: No family present at bedside this morning.  Disposition Plan:  Level of care: Telemetry Medical Status is: Inpatient  Remains inpatient appropriate because:Unsafe d/c plan, IV treatments appropriate due to intensity of illness or inability to take PO and Inpatient level of care  appropriate due to severity of illness   Dispo: The patient is from: Home              Anticipated d/c is to: Home              Patient currently is not medically stable to d/c.   Difficult to place patient No   Consultants:   Nephrology  Procedures:   Renal ultrasound  Antimicrobials:   None   Subjective: Patient seen and examined at bedside, resting comfortably.  Lying in bed.  No family present.  Dyspnea continues to slowly improve.  Not as much urinary output over the past 24 hours and weight slightly up today.  No other questions or concerns at this time.  Patient denies headache, no fever/chills/night sweats, no nausea/vomiting/diarrhea, no chest pain, no palpitations, no abdominal pain, no cough/congestion.  No acute events overnight per nursing staff.  Objective: Vitals:   11/21/20 0012 11/21/20 0534 11/21/20 0555 11/21/20 0645  BP: (!) 158/50 (!) 197/84 (!) 197/84 (!) 179/73  Pulse: (!) 55   (!) 54  Resp: 17 19  20   Temp: 98.4 F (36.9 C) 98.4 F (36.9 C)    TempSrc: Oral Tympanic    SpO2: 98% 99%  98%  Weight:  86.1 kg    Height:        Intake/Output Summary (Last 24 hours) at 11/21/2020 1316 Last data filed at 11/20/2020 2140 Gross per 24 hour  Intake 240 ml  Output 1050 ml  Net -810 ml   Filed Weights   11/19/20 0433 11/20/20 0520 11/21/20 0534  Weight: 87.5 kg 86.5 kg 86.1 kg    Examination:  General exam: Appears calm and comfortable  Respiratory system: Breath sounds slightly decreased bilateral bases, mild crackles, no wheezing, normal respiratory effort, 2 L nasal cannula with SPO2 98% at rest Cardiovascular system: S1 & S2 heard, RRR. No JVD, murmurs, rubs, gallops or clicks.  Trace lower extremity edema to mid shin Gastrointestinal system: Abdomen is nondistended, soft and nontender. No organomegaly or masses felt. Normal bowel sounds heard. Central nervous system: Alert and oriented. No focal neurological deficits. Extremities: Symmetric 5  x 5 power. Skin: No rashes, lesions or ulcers Psychiatry: Judgement and insight appear normal. Mood & affect appropriate.     Data Reviewed: I have personally reviewed following labs and imaging studies  CBC: Recent Labs  Lab 11/18/20 1226 11/19/20 0214 11/20/20 0131  WBC 5.2 3.9* 3.6*  NEUTROABS 4.0  --   --   HGB 8.8* 7.4* 7.6*  HCT 28.8* 23.5* 23.9*  MCV 95.4 92.2 92.3  PLT 170 133* 250*   Basic Metabolic Panel: Recent Labs  Lab 11/18/20 1226 11/19/20 0214 11/20/20 0131 11/21/20 0304  NA 139 138 139 139  K 4.6 4.2 4.3 4.1  CL 109 110 111 106  CO2 20* 21* 22 24  GLUCOSE 169* 111* 90 130*  BUN 61* 61* 63* 64*  CREATININE 3.41* 3.30* 3.13* 3.13*  CALCIUM 8.6* 8.4* 8.5* 8.5*  PHOS  --  4.6 5.3* 4.7*   GFR: Estimated Creatinine Clearance: 16.3 mL/min (A) (by C-G formula based on SCr of 3.13 mg/dL (H)). Liver Function Tests: Recent Labs  Lab 11/18/20 1226 11/19/20 0214 11/20/20 0131 11/21/20 0304  AST 25  --   --   --   ALT 29  --   --   --  ALKPHOS 90  --   --   --   BILITOT 0.9  --   --   --   PROT 5.7*  --   --   --   ALBUMIN 3.2* 2.8* 2.7* 2.9*   No results for input(s): LIPASE, AMYLASE in the last 168 hours. No results for input(s): AMMONIA in the last 168 hours. Coagulation Profile: No results for input(s): INR, PROTIME in the last 168 hours. Cardiac Enzymes: No results for input(s): CKTOTAL, CKMB, CKMBINDEX, TROPONINI in the last 168 hours. BNP (last 3 results) No results for input(s): PROBNP in the last 8760 hours. HbA1C: Recent Labs    11/18/20 1824  HGBA1C 5.4   CBG: Recent Labs  Lab 11/20/20 1128 11/20/20 1644 11/20/20 2110 11/21/20 0740 11/21/20 1141  GLUCAP 189* 138* 118* 137* 213*   Lipid Profile: No results for input(s): CHOL, HDL, LDLCALC, TRIG, CHOLHDL, LDLDIRECT in the last 72 hours. Thyroid Function Tests: No results for input(s): TSH, T4TOTAL, FREET4, T3FREE, THYROIDAB in the last 72 hours. Anemia Panel: Recent Labs     11/19/20 0214  FERRITIN 149  TIBC 202*  IRON 28   Sepsis Labs: No results for input(s): PROCALCITON, LATICACIDVEN in the last 168 hours.  Recent Results (from the past 240 hour(s))  Resp Panel by RT-PCR (Flu A&B, Covid) Nasopharyngeal Swab     Status: None   Collection Time: 11/18/20  1:28 PM   Specimen: Nasopharyngeal Swab; Nasopharyngeal(NP) swabs in vial transport medium  Result Value Ref Range Status   SARS Coronavirus 2 by RT PCR NEGATIVE NEGATIVE Final    Comment: (NOTE) SARS-CoV-2 target nucleic acids are NOT DETECTED.  The SARS-CoV-2 RNA is generally detectable in upper respiratory specimens during the acute phase of infection. The lowest concentration of SARS-CoV-2 viral copies this assay can detect is 138 copies/mL. A negative result does not preclude SARS-Cov-2 infection and should not be used as the sole basis for treatment or other patient management decisions. A negative result may occur with  improper specimen collection/handling, submission of specimen other than nasopharyngeal swab, presence of viral mutation(s) within the areas targeted by this assay, and inadequate number of viral copies(<138 copies/mL). A negative result must be combined with clinical observations, patient history, and epidemiological information. The expected result is Negative.  Fact Sheet for Patients:  EntrepreneurPulse.com.au  Fact Sheet for Healthcare Providers:  IncredibleEmployment.be  This test is no t yet approved or cleared by the [place] FDA and  has been authorized for detection and/or diagnosis of SARS-CoV-2 by FDA under an Emergency Use Authorization (EUA). This EUA will remain  in effect (meaning this test can be used) for the duration of the COVID-19 declaration under Section 564(b)(1) of the Act, 21 U.S.C.section 360bbb-3(b)(1), unless the authorization is terminated  or revoked sooner.       Influenza A by PCR NEGATIVE  NEGATIVE Final   Influenza B by PCR NEGATIVE NEGATIVE Final    Comment: (NOTE) The Xpert Xpress SARS-CoV-2/FLU/RSV plus assay is intended as an aid in the diagnosis of influenza from Nasopharyngeal swab specimens and should not be used as a sole basis for treatment. Nasal washings and aspirates are unacceptable for Xpert Xpress SARS-CoV-2/FLU/RSV testing.  Fact Sheet for Patients: EntrepreneurPulse.com.au  Fact Sheet for Healthcare Providers: IncredibleEmployment.be  This test is not yet approved or cleared by the [place] FDA and has been authorized for detection and/or diagnosis of SARS-CoV-2 by FDA under an Emergency Use Authorization (EUA). This EUA will remain in effect (  meaning this test can be used) for the duration of the COVID-19 declaration under Section 564(b)(1) of the Act, 21 U.S.C. section 360bbb-3(b)(1), unless the authorization is terminated or revoked.  Performed at [facility], [place] [address], [place], [place] [address]          Radiology Studies: No results found.      Scheduled Meds: . amLODipine  10 mg Oral Daily  . atorvastatin  40 mg Oral QPM  . bisoprolol  5 mg Oral Daily  . calcitRIOL  0.25 mcg Oral Daily  . [START ON 11/27/2020] cloNIDine  0.2 mg Transdermal Weekly  . clopidogrel  75 mg Oral Q breakfast  . feeding supplement (NEPRO CARB STEADY)  237 mL Oral BID BM  . fluticasone  1 spray Each Nare Daily  . furosemide  80 mg Intravenous BID  . heparin  5,000 Units Subcutaneous Q8H  . hydrALAZINE  100 mg Oral TID  . insulin aspart  0-6 Units Subcutaneous TID WC  . insulin glargine  15 Units Subcutaneous QHS  . isosorbide mononitrate  60 mg Oral Daily  . montelukast  10 mg Oral QHS  . primidone  100 mg Oral Daily  . sodium bicarbonate  650 mg Oral BID  . sodium chloride flush  3 mL Intravenous Q12H  . valACYclovir  1,000 mg Oral BID   Continuous Infusions:    LOS: 2 days    Time  spent: 36 minutes spent on chart review, discussion with nursing staff, consultants, updating family and interview/physical exam; more than 50% of that time was spent in counseling and/or coordination of care.    Mitzie Marlar J [place] DO [facility] Available via Epic secure chat 7am-7pm After these hours, please refer to coverage provider listed on [email or web address] 11/21/2020, 1:16 PM

## 2020-11-21 NOTE — Progress Notes (Signed)
[facility] Progress Note  Name: Joyce [name] MRN: [id number] DOB: 1940-12-19   Subjective:  she had 1.7 liters UOP over 5/28.  She has continued on lasix 80 mg IV BID.  She has been on 2 liters oxygen.  cloninidine patch is being increased to 0.2 mcg.  She states she follows with Dr. Nilda Calamity in [place], [place] as below.  She had to hang up the phone with her daughter because was tired/short of breath from talking.  Her son visited her today - updated him at bedside.   Review of systems:   Reports shortness of breath with prolonged speech   Denies chest pain No n/v  ------ Background on consult: Joyce [name] is a 80 y.o. 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 Lamonte Sakai in pulmonary.  She also appears quite anxious and has chronic pain from fibromyalgia.  She has CKD that has progressed.  Followed by a Dr. Nilda Calamity in [place].  crt seems to have progressed-  Was 2.7 in December, 2.9 in January and most recetnly over 3 indicating  GFR in the low teens so she was sent for dialysis education which she just had yesterday-is considering PD but does not have all of the information just yet.  Patient reports that over the last week she has become more short of breath and has noted over the last 3 days drop in urine output.  She called her nephrologist with these complaints, was told to double up on her diuretic which she did but urine output did not improve.  She is wavering with her complaint but has told some providers that she has made absolutely no urine since yesterday.  She has edema and chest x-ray is consistent with CHF.  She also complains of itching but has not had any appetite disturbance.  Labs of note include BUN and creatinine 61 and 3.4, potassium 4.6, bicarb 20, albumin 3.2, hemoglobin 8.8, BNP 1300.   Interestingly, pulse ox is greater than 95 on room air  Intake/Output Summary (Last 24 hours) at 11/21/2020 1340 Last data filed at 11/20/2020 2140 Gross per 24 hour  Intake 240 ml  Output 1050 ml  Net -810 ml    Vitals:  Vitals:   11/21/20 0012 11/21/20 0534 11/21/20 0555 11/21/20 0645  BP: (!) 158/50 (!) 197/84 (!) 197/84 (!) 179/73  Pulse: (!) 55   (!) 54  Resp: 17 19  20   Temp: 98.4 F (36.9 C) 98.4 F (36.9 C)    TempSrc: Oral Tympanic    SpO2: 98% 99%  98%  Weight:  86.1 kg    Height:         Physical Exam: General adult female in bed in no acute distress HEENT normocephalic atraumatic extraocular movements intact sclera anicteric Neck supple trachea midline Lungs clear to auscultation; normal work of breathing at rest and increased work of breathing with exertion or speech; on 2 liters oxygen Heart regular rate and rhythm no rubs or gallops appreciated Abdomen soft nontender  Slightly distended Extremities trace pedal edema  Psych normal mood and affect Neuro - alert and oriented x 3 provides hx and follows commands  Medications reviewed   Labs:  BMP Latest Ref Rng & Units 11/21/2020 11/20/2020 11/19/2020  Glucose 70 - 99 mg/dL 130(H) 90 111(H)  BUN 8 - 23 mg/dL 64(H) 63(H) 61(H)  Creatinine 0.44 - 1.00  mg/dL 3.13(H) 3.13(H) 3.30(H)  Sodium 135 - 145 mmol/L 139 139 138  Potassium 3.5 - 5.1 mmol/L 4.1 4.3 4.2  Chloride 98 - 111 mmol/L 106 111 110  CO2 22 - 32 mmol/L 24 22 21(L)  Calcium 8.9 - 10.3 mg/dL 8.5(L) 8.5(L) 8.4(L)     Assessment/Plan:   1. AKI on CKD IV.  She does not appear to be having overwhelming uremic symptoms.  She is responding to lasix.  UA proteinuria and 0-5 RBC.  Renal ultrasound with no hydro  - continue lasix - 80 mg IV BID for now  - Metolazone before next dose of lasix   2. Hypertension - optimize volume with lasix.  note she was started on clonidine patch and this appears to be planned for increase to 0.2 mcg weekly (note is ordered  to start at the higher dose in a week though). Continue other regimen  3. Anemia CKD  - anemia may be contributing to shortness of breath.  Iron a bit low - s/p feraheme once on 5/27 which she tolerated poorly - n/v/chest discomfort.  Would not use again.  Note she got aranesp 150 mcg on 5/26.  May need PRBC's soon   4.  metabolic bone disease -continue calcitriol   5.  Metabolic acidosis- on bicarb outpatient - continue here  dispo - continue inpatient monitoring  Claudia Desanctis, MD 11/21/2020 2:00 PM

## 2020-11-21 NOTE — Progress Notes (Signed)
Mobility Specialist - Progress Note   11/21/20 1302  Mobility  Activity Ambulated in hall  Level of Assistance Standby assist, set-up cues, supervision of patient - no hands on  Assistive Device Front wheel walker  Distance Ambulated (ft) 70 ft (35 ft x 2)  Mobility Ambulated with assistance in hallway  Mobility Response Tolerated well  Mobility performed by Mobility specialist  $Mobility charge 1 Mobility   Pt required one seated rest break due to SOB. SpO2 remained at 96% throughout on 2L O2. Pt back in bed after walk, call bell at side.   Pricilla Handler Mobility Specialist Mobility Specialist Phone: [phone]

## 2020-11-22 DIAGNOSIS — E1169 Type 2 diabetes mellitus with other specified complication: Secondary | ICD-10-CM | POA: Diagnosis not present

## 2020-11-22 DIAGNOSIS — J449 Chronic obstructive pulmonary disease, unspecified: Secondary | ICD-10-CM | POA: Diagnosis not present

## 2020-11-22 DIAGNOSIS — I5032 Chronic diastolic (congestive) heart failure: Secondary | ICD-10-CM | POA: Diagnosis not present

## 2020-11-22 DIAGNOSIS — E8770 Fluid overload, unspecified: Secondary | ICD-10-CM | POA: Diagnosis not present

## 2020-11-22 LAB — GLUCOSE, CAPILLARY
Glucose-Capillary: 134 mg/dL — ABNORMAL HIGH (ref 70–99)
Glucose-Capillary: 153 mg/dL — ABNORMAL HIGH (ref 70–99)
Glucose-Capillary: 172 mg/dL — ABNORMAL HIGH (ref 70–99)
Glucose-Capillary: 201 mg/dL — ABNORMAL HIGH (ref 70–99)

## 2020-11-22 LAB — RENAL FUNCTION PANEL
Albumin: 2.7 g/dL — ABNORMAL LOW (ref 3.5–5.0)
Anion gap: 9 (ref 5–15)
BUN: 64 mg/dL — ABNORMAL HIGH (ref 8–23)
CO2: 24 mmol/L (ref 22–32)
Calcium: 8.3 mg/dL — ABNORMAL LOW (ref 8.9–10.3)
Chloride: 104 mmol/L (ref 98–111)
Creatinine, Ser: 3.08 mg/dL — ABNORMAL HIGH (ref 0.44–1.00)
GFR, Estimated: 15 mL/min — ABNORMAL LOW (ref >60)
Glucose, Bld: 115 mg/dL — ABNORMAL HIGH (ref 70–99)
Phosphorus: 4 mg/dL (ref 2.5–4.6)
Potassium: 3.7 mmol/L (ref 3.5–5.1)
Sodium: 137 mmol/L (ref 135–145)

## 2020-11-22 MED ORDER — ARFORMOTEROL TARTRATE 15 MCG/2ML IN NEBU
15.0000 ug | INHALATION_SOLUTION | Freq: Two times a day (BID) | RESPIRATORY_TRACT | Status: DC
  Administered 2020-11-22 – 2020-11-24 (×4): 15 ug via RESPIRATORY_TRACT
  Filled 2020-11-22 (×4): qty 2

## 2020-11-22 MED ORDER — METOLAZONE 5 MG PO TABS
5.0000 mg | ORAL_TABLET | Freq: Once | ORAL | Status: AC
  Administered 2020-11-22: 5 mg via ORAL
  Filled 2020-11-22: qty 1

## 2020-11-22 MED ORDER — CLONIDINE HCL 0.3 MG/24HR TD PTWK: 0.3000 mg | MEDICATED_PATCH | TRANSDERMAL | Status: DC

## 2020-11-22 MED ORDER — CLONIDINE HCL 0.3 MG/24HR TD PTWK
0.3000 mg | MEDICATED_PATCH | TRANSDERMAL | Status: DC
  Administered 2020-11-22: 0.3 mg via TRANSDERMAL
  Filled 2020-11-22: qty 1

## 2020-11-22 MED ORDER — UMECLIDINIUM BROMIDE 62.5 MCG/INH IN AEPB
1.0000 | INHALATION_SPRAY | Freq: Every day | RESPIRATORY_TRACT | Status: DC
  Administered 2020-11-23 – 2020-11-24 (×2): 1 via RESPIRATORY_TRACT
  Filled 2020-11-22: qty 7

## 2020-11-22 NOTE — Progress Notes (Signed)
PROGRESS NOTE    Joyce [name]  SJG:[id number] DOB: [birth year] DOA: 11/18/2020 PCP: Joyce Dimmer, MD    Brief Narrative:  Joyce [name] is a 80 year old female with past medical history significant for type 2 diabetes mellitus, hyperlipidemia, essential hypertension, fibromyalgia, COPD, CAD s/p CABG, CKD stage V presenting to [name], ED on 5/26 with progressive shortness of breath and edema.  Recently seen by her primary nephrologist with change from her Lasix to Bumex last week.  Has been planning to start peritoneal dialysis in the near future.  Patient now where she has difficulty ambulating due to her significant dyspnea and reports urine output has been decreasing.  In the ED, temperature 98.2 F, HR 59, RR 15, BP 192/58, SPO2 97% on room air.  Sodium 139, potassium 4.6, chloride 109, CO2 20, glucose 169, BUN 61, creatinine 3.41.  BNP 1340.  Troponin 18>18.  WBC 5.2, hemoglobin 8.8, platelets 170.  Cova-19 PCR negative.  Influenza A/B PCR negative.  Chest x-ray with findings of progressive pulmonary edema.  Nephrology was consulted. TRH consulted for further evaluation and management of progressive dyspnea 2/2 volume overload from progressive renal failure.   Assessment & Plan:   Principal Problem:   Volume overload Active Problems:   Stage 5 chronic kidney disease (HCC)   Essential hypertension   Diabetes mellitus type 2 in obese (HCC)   S/P CABG x 4   Diastolic CHF, chronic (HCC)   COPD with asthma (HCC)   Fibromyalgia   Volume overload 2/2 progressive CKD stage V Metabolic acidosis Patient presenting to the ED with progressive shortness of breath and decreased urinary output despite recent diuretic change from furosemide to Bumex by outpatient nephrologist.  Patient was in the planning stages to initiate peritoneal dialysis in the near future.  Patient is afebrile without leukocytosis.  Influenza and COVID-19 PCR negative.  BNP elevated with chest x-ray findings of  pulmonary edema.  Renal ultrasound without obstruction with notable medical renal disease. --Nephrology following, appreciate assistance --net negative 2.7L past 24 hours, net negative 6.1L since admission --wt 89.1>87.5>86.5>86.1>85.5kg --Furosemide 80 mg IV q12h --s/p metolazone 5/29 --Sodium bicarbonate 650 mg p.o. twice daily --Strict I's and O's and daily weights --BMP daily --Ambulatory O2 screen today  Acute on chronic diastolic congestive heart failure. TTE 04/2020 with preserved LVEF and grade 1 diastolic dysfunction.  Elevated BNP and chest x-ray findings consistent with pulmonary edema.  Etiology likely secondary to worsening renal function. --Continue furosemide 80 mg IV q12h --Strict I's and O's and daily weights  Anemia of chronic renal disease Hemoglobin 7.4, MCV 92.2.  Iron 20, TIBC 202, ferritin 149.  Being IV Feraheme today. --CBC daily --Transfuse for hemoglobin < 7.0  CAD s/P CABG --continue Plavix and statin  Essential hypertension BP remains poorly controlled, 190/55 this morning.  Heart rate 56. --Bisoprolol 5 mg p.o. daily --Hydralazine 100 mg p.o. TID --Isosorbide mononitrate 60 mg p.o. daily --Amlodipine 10 mg p.o. daily --Increased clonidine patch to 0.3 mg today --Continue Lasix as above  HLD: Atorvastatin 40 mg p.o. daily  Type 2 diabetes mellitus Home regimen includes Lantus 30 units subcutaneously daily. --Lantus 15 units subcutaneously daily --SSI for coverage --CBG before every meal/at bedtime  COPD On Stiolto at home.  --Budesonide nebs twice daily --Incruse Ellipta --Continue albuterol, Singulair, Flonase  Fibromyalgia Anxiety --Ultram, BuSpar 5 mg p.o. daily as needed, primidone, Requip    DVT prophylaxis: Heparin   Code Status: Full Code Family Communication: No family present at bedside  this morning.  Disposition Plan:  Level of care: Telemetry Medical Status is: Inpatient  Remains inpatient appropriate because:Unsafe  d/c plan, IV treatments appropriate due to intensity of illness or inability to take PO and Inpatient level of care appropriate due to severity of illness   Dispo: The patient is from: Home              Anticipated d/c is to: Home              Patient currently is not medically stable to d/c.   Difficult to place patient No   Consultants:   Nephrology  Procedures:   Renal ultrasound  Antimicrobials:   None   Subjective: Patient seen and examined at bedside, resting comfortably.  Lying in bed.  No family present.  Patient reports dyspnea improved.  Good urine output yesterday with addition of metolazone.  Nephrology plans for another 24 hours of IV diuresis prior to transition to oral medications.    No other questions or concerns at this time.  Patient denies headache, no fever/chills/night sweats, no nausea/vomiting/diarrhea, no chest pain, no palpitations, no abdominal pain, no cough/congestion.  No acute events overnight per nursing staff.  Hopeful for discharge in next 1-2 days.  Objective: Vitals:   11/21/20 2133 11/22/20 0617 11/22/20 0636 11/22/20 0800  BP: (!) 183/64  (!) 195/65 (!) 192/62  Pulse: (!) 53 60    Resp: 19 18    Temp: 98.9 F (37.2 C) 98.1 F (36.7 C)    TempSrc: Oral Oral    SpO2: 98% 99%    Weight:  85.5 kg    Height:        Intake/Output Summary (Last 24 hours) at 11/22/2020 1100 Last data filed at 11/22/2020 0900 Gross per 24 hour  Intake 243 ml  Output 2700 ml  Net -2457 ml   Filed Weights   11/20/20 0520 11/21/20 0534 11/22/20 0617  Weight: 86.5 kg 86.1 kg 85.5 kg    Examination:  General exam: Appears calm and comfortable  Respiratory system: Breath sounds slightly decreased bilateral bases, mild crackles, no wheezing, normal respiratory effort, 2 L nasal cannula with SPO2 98% at rest Cardiovascular system: S1 & S2 heard, RRR. No JVD, murmurs, rubs, gallops or clicks.  Trace lower extremity edema to mid shin Gastrointestinal system:  Abdomen is nondistended, soft and nontender. No organomegaly or masses felt. Normal bowel sounds heard. Central nervous system: Alert and oriented. No focal neurological deficits. Extremities: Symmetric 5 x 5 power. Skin: No rashes, lesions or ulcers Psychiatry: Judgement and insight appear normal. Mood & affect appropriate.     Data Reviewed: I have personally reviewed following labs and imaging studies  CBC: Recent Labs  Lab 11/18/20 1226 11/19/20 0214 11/20/20 0131  WBC 5.2 3.9* 3.6*  NEUTROABS 4.0  --   --   HGB 8.8* 7.4* 7.6*  HCT 28.8* 23.5* 23.9*  MCV 95.4 92.2 92.3  PLT 170 133* 025*   Basic Metabolic Panel: Recent Labs  Lab 11/18/20 1226 11/19/20 0214 11/20/20 0131 11/21/20 0304 11/22/20 0143  NA 139 138 139 139 137  K 4.6 4.2 4.3 4.1 3.7  CL 109 110 111 106 104  CO2 20* 21* 22 24 24   GLUCOSE 169* 111* 90 130* 115*  BUN 61* 61* 63* 64* 64*  CREATININE 3.41* 3.30* 3.13* 3.13* 3.08*  CALCIUM 8.6* 8.4* 8.5* 8.5* 8.3*  PHOS  --  4.6 5.3* 4.7* 4.0   GFR: Estimated Creatinine Clearance: 16.5 mL/min (A) (by  C-G formula based on SCr of 3.08 mg/dL (H)). Liver Function Tests: Recent Labs  Lab 11/18/20 1226 11/19/20 0214 11/20/20 0131 11/21/20 0304 11/22/20 0143  AST 25  --   --   --   --   ALT 29  --   --   --   --   ALKPHOS 90  --   --   --   --   BILITOT 0.9  --   --   --   --   PROT 5.7*  --   --   --   --   ALBUMIN 3.2* 2.8* 2.7* 2.9* 2.7*   No results for input(s): LIPASE, AMYLASE in the last 168 hours. No results for input(s): AMMONIA in the last 168 hours. Coagulation Profile: No results for input(s): INR, PROTIME in the last 168 hours. Cardiac Enzymes: No results for input(s): CKTOTAL, CKMB, CKMBINDEX, TROPONINI in the last 168 hours. BNP (last 3 results) No results for input(s): PROBNP in the last 8760 hours. HbA1C: No results for input(s): HGBA1C in the last 72 hours. CBG: Recent Labs  Lab 11/21/20 0740 11/21/20 1141 11/21/20 1643  11/21/20 2115 11/22/20 0733  GLUCAP 137* 213* 117* 172* 134*   Lipid Profile: No results for input(s): CHOL, HDL, LDLCALC, TRIG, CHOLHDL, LDLDIRECT in the last 72 hours. Thyroid Function Tests: No results for input(s): TSH, T4TOTAL, FREET4, T3FREE, THYROIDAB in the last 72 hours. Anemia Panel: No results for input(s): VITAMINB12, FOLATE, FERRITIN, TIBC, IRON, RETICCTPCT in the last 72 hours. Sepsis Labs: No results for input(s): PROCALCITON, LATICACIDVEN in the last 168 hours.  Recent Results (from the past 240 hour(s))  Resp Panel by RT-PCR (Flu A&B, Covid) Nasopharyngeal Swab     Status: None   Collection Time: 11/18/20  1:28 PM   Specimen: Nasopharyngeal Swab; Nasopharyngeal(NP) swabs in vial transport medium  Result Value Ref Range Status   SARS Coronavirus 2 by RT PCR NEGATIVE NEGATIVE Final    Comment: (NOTE) SARS-CoV-2 target nucleic acids are NOT DETECTED.  The SARS-CoV-2 RNA is generally detectable in upper respiratory specimens during the acute phase of infection. The lowest concentration of SARS-CoV-2 viral copies this assay can detect is 138 copies/mL. A negative result does not preclude SARS-Cov-2 infection and should not be used as the sole basis for treatment or other patient management decisions. A negative result may occur with  improper specimen collection/handling, submission of specimen other than nasopharyngeal swab, presence of viral mutation(s) within the areas targeted by this assay, and inadequate number of viral copies(<138 copies/mL). A negative result must be combined with clinical observations, patient history, and epidemiological information. The expected result is Negative.  Fact Sheet for Patients:  EntrepreneurPulse.com.au  Fact Sheet for Healthcare Providers:  IncredibleEmployment.be  This test is no t yet approved or cleared by the [place] FDA and  has been authorized for detection and/or diagnosis  of SARS-CoV-2 by FDA under an Emergency Use Authorization (EUA). This EUA will remain  in effect (meaning this test can be used) for the duration of the COVID-19 declaration under Section 564(b)(1) of the Act, 21 U.S.C.section 360bbb-3(b)(1), unless the authorization is terminated  or revoked sooner.       Influenza A by PCR NEGATIVE NEGATIVE Final   Influenza B by PCR NEGATIVE NEGATIVE Final    Comment: (NOTE) The Xpert Xpress SARS-CoV-2/FLU/RSV plus assay is intended as an aid in the diagnosis of influenza from Nasopharyngeal swab specimens and should not be used as a sole basis for treatment.  Nasal washings and aspirates are unacceptable for Xpert Xpress SARS-CoV-2/FLU/RSV testing.  Fact Sheet for Patients: EntrepreneurPulse.com.au  Fact Sheet for Healthcare Providers: IncredibleEmployment.be  This test is not yet approved or cleared by the [place] FDA and has been authorized for detection and/or diagnosis of SARS-CoV-2 by FDA under an Emergency Use Authorization (EUA). This EUA will remain in effect (meaning this test can be used) for the duration of the COVID-19 declaration under Section 564(b)(1) of the Act, 21 U.S.C. section 360bbb-3(b)(1), unless the authorization is terminated or revoked.  Performed at [facility], [place] [address] [place], [place] [address]          Radiology Studies: No results found.      Scheduled Meds: . amLODipine  10 mg Oral Daily  . arformoterol  15 mcg Nebulization BID  . atorvastatin  40 mg Oral QPM  . bisoprolol  5 mg Oral Daily  . calcitRIOL  0.25 mcg Oral Daily  . cloNIDine  0.3 mg Transdermal Weekly  . clopidogrel  75 mg Oral Q breakfast  . feeding supplement (NEPRO CARB STEADY)  237 mL Oral BID BM  . fluticasone  1 spray Each Nare Daily  . furosemide  80 mg Intravenous BID  . heparin  5,000 Units Subcutaneous Q8H  . hydrALAZINE  100 mg Oral TID  . insulin aspart  0-6  Units Subcutaneous TID WC  . insulin glargine  15 Units Subcutaneous QHS  . isosorbide mononitrate  60 mg Oral Daily  . metolazone  5 mg Oral Once  . montelukast  10 mg Oral QHS  . primidone  100 mg Oral Daily  . sodium bicarbonate  650 mg Oral BID  . sodium chloride flush  3 mL Intravenous Q12H  . umeclidinium bromide  1 puff Inhalation Daily  . valACYclovir  1,000 mg Oral BID   Continuous Infusions:    LOS: 3 days    Time spent: 36 minutes spent on chart review, discussion with nursing staff, consultants, updating family and interview/physical exam; more than 50% of that time was spent in counseling and/or coordination of care.    Venida Tsukamoto J [place] DO [facility] Available via Epic secure chat 7am-7pm After these hours, please refer to coverage provider listed on [email or web address] 11/22/2020, 11:00 AM

## 2020-11-22 NOTE — Progress Notes (Signed)
Physical Therapy Treatment Patient Details Name: Joyce [name] MRN: [id number] DOB: [birth year] Today's Date: 11/22/2020    History of Present Illness 80 y.o. F admitted to [facility] on 5/26 for symptoms of SOB, chest tightness, limited urine output, and generalized weakness.Pt diagnosed with fluid overload and CKD stage 5, per nephrologist, preparing for PD. Past medical history significant for DM, HTN, CAD s/p CABG, fibromyalgia, and COPD/asthma.    PT Comments    Patient progressing slowly towards PT goals. Continues to report shortness of breath and elevated BP. Improved ambulation distance with Min guard assist and use of RW for support. Noted to have 2-3/4 DOE with activity needing 1 seated rest break due to fatigue/SOB. Sp02 remained in high 90s on 2L/min 02 [place]. BP has been running high, 191/53 at start of session; asymptomatic. Would benefit from trial of rollator next session to help with energy conservation and improve endurance. Will continue to follow and progress as tolerated.    Follow Up Recommendations  No PT follow up;Supervision - Intermittent     Equipment Recommendations  None recommended by PT    Recommendations for Other Services       Precautions / Restrictions Precautions Precautions: Fall;Other (comment) Precaution Comments: watch 02 and elevated BP Restrictions Weight Bearing Restrictions: No    Mobility  Bed Mobility               General bed mobility comments: Up in chair upon PT arrival.    Transfers Overall transfer level: Modified independent Equipment used: None             General transfer comment: Stood from chair x2 without difficulty.  Ambulation/Gait Ambulation/Gait assistance: Min guard Gait Distance (Feet): 40 Feet (x2 bouts) Assistive device: Rolling walker (2 wheeled) Gait Pattern/deviations: Step-through pattern;Decreased stride length Gait velocity: decreased   General Gait Details: Slow, mildly unsteady gait with RW  for support; 2-3/4 DOE. 1 seated rest break. Sp02 98% on 2L/min 02 [place].   Stairs             Wheelchair Mobility    Modified Rankin (Stroke Patients Only)       Balance Overall balance assessment: Needs assistance Sitting-balance support: Feet supported;No upper extremity supported Sitting balance-Leahy Scale: Good     Standing balance support: During functional activity Standing balance-Leahy Scale: Fair Standing balance comment: Able to stand and walk short distances without UE support but does better with UE support for longer distances.                            Cognition Arousal/Alertness: Awake/alert Behavior During Therapy: WFL for tasks assessed/performed Overall Cognitive Status: Within Functional Limits for tasks assessed                                        Exercises      General Comments General comments (skin integrity, edema, etc.): Sp02 remained in high 90s on 2L/min 02 [place]. BP has been running high, 191/53 at start of session; asymptomatic.      Pertinent Vitals/Pain Pain Assessment: No/denies pain    Home Living                      Prior Function            PT Goals (current goals can now be found in  the care plan section) Progress towards PT goals: Progressing toward goals    Frequency    Min 3X/week      PT Plan Current plan remains appropriate    Co-evaluation              AM-PAC PT "6 Clicks" Mobility   Outcome Measure  Help needed turning from your back to your side while in a flat bed without using bedrails?: None Help needed moving from lying on your back to sitting on the side of a flat bed without using bedrails?: None Help needed moving to and from a bed to a chair (including a wheelchair)?: A Little Help needed standing up from a chair using your arms (e.g., wheelchair or bedside chair)?: A Little Help needed to walk in hospital room?: A Little Help needed climbing 3-5  steps with a railing? : A Little 6 Click Score: 20    End of Session Equipment Utilized During Treatment: Oxygen;Gait belt Activity Tolerance: Patient tolerated treatment well;Patient limited by fatigue Patient left: in chair;with call bell/phone within reach Nurse Communication: Mobility status PT Visit Diagnosis: Difficulty in walking, not elsewhere classified (R26.2);Unsteadiness on feet (R26.81)     Time: 7121-9758 PT Time Calculation (min) (ACUTE ONLY): 20 min  Charges:  $Gait Training: 8-22 mins                     Marisa Severin, PT, DPT Acute Rehabilitation Services Pager [phone] Office [place] 11/22/2020, 12:51 PM

## 2020-11-22 NOTE — Progress Notes (Signed)
Patient ID: Joyce [name], female   DOB: 08-26-1940, 80 y.o.   MRN: [id number] [facility] Progress Note   Assessment/ Plan:   1. Acute kidney Injury on CKD stage IV: Continues to have successful response to diuresis with urine output of 2.4 L overnight with corresponding improvement of respiratory status.  She does not have any acute electrolyte abnormality or uremic signs or symptoms to prompt initiation of dialysis.  Creatinine slightly trending down.  I would recommend an additional 24 hours of intravenous diuresis and if respiratory status continues to show improvement, transition to oral diuretic to allow for discharge home thereafter. 2.  Hypertension: Elevated blood pressure noted on multiple agents-monitor with optimization of volume status with diuresis. 3.  Anemia of chronic kidney disease: Status post Aranesp on 5/26.  Unfortunately did not tolerate Feraheme on 5/27 with nausea/vomiting and some chest discomfort. 4.  Secondary hyperparathyroidism: Continue calcitriol for PTH control and renal diet for phosphorus control.  Subjective:   Reports that breathing is better this morning without any chest pain/tightness.   Objective:   BP (!) 192/62 (BP Location: Left Arm)   Pulse 60   Temp 98.1 F (36.7 C) (Oral)   Resp 18   Ht 5\' 5"  (1.651 m)   Wt 85.5 kg   SpO2 99%   BMI 31.38 kg/m   Intake/Output Summary (Last 24 hours) at 11/22/2020 1027 Last data filed at 11/22/2020 0900 Gross per 24 hour  Intake 243 ml  Output 2700 ml  Net -2457 ml   Weight change: -0.544 kg  Physical Exam: Gen: Sitting comfortably on a chair next to her bed. CVS: Pulse regular rhythm, normal rate, S1 and S2 normal Resp: Fine rales bilaterally, no rhonchi/wheeze Abd: Soft, obese, nontender Ext: Trace bilateral pitting edema over legs.  Imaging: No results found.  Labs: BMET Recent Labs  Lab 11/18/20 1226 11/19/20 0214 11/20/20 0131 11/21/20 0304 11/22/20 0143  NA 139 138  139 139 137  K 4.6 4.2 4.3 4.1 3.7  CL 109 110 111 106 104  CO2 20* 21* 22 24 24   GLUCOSE 169* 111* 90 130* 115*  BUN 61* 61* 63* 64* 64*  CREATININE 3.41* 3.30* 3.13* 3.13* 3.08*  CALCIUM 8.6* 8.4* 8.5* 8.5* 8.3*  PHOS  --  4.6 5.3* 4.7* 4.0   CBC Recent Labs  Lab 11/18/20 1226 11/19/20 0214 11/20/20 0131  WBC 5.2 3.9* 3.6*  NEUTROABS 4.0  --   --   HGB 8.8* 7.4* 7.6*  HCT 28.8* 23.5* 23.9*  MCV 95.4 92.2 92.3  PLT 170 133* 131*    Medications:    . amLODipine  10 mg Oral Daily  . arformoterol  15 mcg Nebulization BID  . atorvastatin  40 mg Oral QPM  . bisoprolol  5 mg Oral Daily  . calcitRIOL  0.25 mcg Oral Daily  . cloNIDine  0.3 mg Transdermal Weekly  . clopidogrel  75 mg Oral Q breakfast  . feeding supplement (NEPRO CARB STEADY)  237 mL Oral BID BM  . fluticasone  1 spray Each Nare Daily  . furosemide  80 mg Intravenous BID  . heparin  5,000 Units Subcutaneous Q8H  . hydrALAZINE  100 mg Oral TID  . insulin aspart  0-6 Units Subcutaneous TID WC  . insulin glargine  15 Units Subcutaneous QHS  . isosorbide mononitrate  60 mg Oral Daily  . montelukast  10 mg Oral QHS  . primidone  100 mg Oral Daily  . sodium bicarbonate  650 mg Oral BID  . sodium chloride flush  3 mL Intravenous Q12H  . umeclidinium bromide  1 puff Inhalation Daily  . valACYclovir  1,000 mg Oral BID      Elmarie Shiley, MD 11/22/2020, 10:27 AM

## 2020-11-22 NOTE — Progress Notes (Signed)
SATURATION QUALIFICATIONS: (This note is used to comply with regulatory documentation for home oxygen)  Patient Saturations on Room Air at Rest = 92%  Patient Saturations on Room Air while Ambulating = 87%  Patient Saturations on 2 Liters of oxygen while Ambulating = 92-95%  Please briefly explain why patient needs home oxygen: Pt requires oxygen to maintain oxygen saturation during ambulation.   Lenna Sciara, RN, BSN

## 2020-11-23 DIAGNOSIS — E8770 Fluid overload, unspecified: Principal | ICD-10-CM

## 2020-11-23 DIAGNOSIS — N179 Acute kidney failure, unspecified: Secondary | ICD-10-CM | POA: Diagnosis not present

## 2020-11-23 DIAGNOSIS — J449 Chronic obstructive pulmonary disease, unspecified: Secondary | ICD-10-CM

## 2020-11-23 DIAGNOSIS — I509 Heart failure, unspecified: Secondary | ICD-10-CM

## 2020-11-23 DIAGNOSIS — N189 Chronic kidney disease, unspecified: Secondary | ICD-10-CM

## 2020-11-23 LAB — RENAL FUNCTION PANEL
Albumin: 2.7 g/dL — ABNORMAL LOW (ref 3.5–5.0)
Anion gap: 10 (ref 5–15)
BUN: 66 mg/dL — ABNORMAL HIGH (ref 8–23)
CO2: 25 mmol/L (ref 22–32)
Calcium: 8.5 mg/dL — ABNORMAL LOW (ref 8.9–10.3)
Chloride: 102 mmol/L (ref 98–111)
Creatinine, Ser: 3.18 mg/dL — ABNORMAL HIGH (ref 0.44–1.00)
GFR, Estimated: 14 mL/min — ABNORMAL LOW (ref >60)
Glucose, Bld: 139 mg/dL — ABNORMAL HIGH (ref 70–99)
Phosphorus: 4.3 mg/dL (ref 2.5–4.6)
Potassium: 3.6 mmol/L (ref 3.5–5.1)
Sodium: 137 mmol/L (ref 135–145)

## 2020-11-23 LAB — GLUCOSE, CAPILLARY
Glucose-Capillary: 143 mg/dL — ABNORMAL HIGH (ref 70–99)
Glucose-Capillary: 241 mg/dL — ABNORMAL HIGH (ref 70–99)
Glucose-Capillary: 202 mg/dL — ABNORMAL HIGH (ref 70–99)
Glucose-Capillary: 170 mg/dL — ABNORMAL HIGH (ref 70–99)

## 2020-11-23 LAB — MAGNESIUM: Magnesium: 1.9 mg/dL (ref 1.7–2.4)

## 2020-11-23 MED ORDER — BUMETANIDE 1 MG PO TABS
1.0000 mg | ORAL_TABLET | Freq: Two times a day (BID) | ORAL | Status: DC
  Administered 2020-11-23 – 2020-11-24 (×2): 1 mg via ORAL
  Filled 2020-11-23 (×3): qty 1

## 2020-11-23 MED ORDER — SPIRONOLACTONE 12.5 MG HALF TABLET
12.5000 mg | ORAL_TABLET | Freq: Every day | ORAL | Status: DC
  Administered 2020-11-23 – 2020-11-24 (×2): 12.5 mg via ORAL
  Filled 2020-11-23 (×2): qty 1

## 2020-11-23 NOTE — Progress Notes (Signed)
Mobility Specialist: Progress Note   11/23/20 1757  Mobility  Activity Ambulated in hall  Level of Assistance Modified independent, requires aide device or extra time  Assistive Device Front wheel walker  Distance Ambulated (ft) 150 ft  Mobility Ambulated independently in hallway  Mobility Response Tolerated well  Mobility performed by Mobility specialist  $Mobility charge 1 Mobility   Pre-Mobility on 2 L/min [place]: 97% SpO2 During Mobility on 2 L/min [place]: 98% SpO2 Post-Mobility:            On 2 L/min : 67 HR, 97% SpO2           On RA: 93-94% SpO2  Pt stopped for a brief standing break due to feeling SOB, otherwise asx. Pt back to bed after walk and was weaned down to RA while in the bed. I believe pt will still need O2 for ambulation though. Pt has call bell at her side.   [facility] Detria Cummings Mobility Specialist Mobility Specialist Phone: [phone]

## 2020-11-23 NOTE — Progress Notes (Signed)
Patient ID: Joyce [name], female   DOB: July 04, 1940, 80 y.o.   MRN: [id number] [facility] Progress Note   Assessment/ Plan:   1. Acute kidney Injury on CKD stage IV: Continues to have successful response to diuresis with urine output of 2.4 L overnight with corresponding improvement of respiratory status.  She does not have any acute electrolyte abnormalities or indications for dialysis at this time.  Renal function essentially unchanged overnight and I will convert her to oral bumetanide which she was taking prior to admission. 2.  Hypertension: Elevated blood pressure noted on multiple agents-monitor with optimization of volume status with diuresis and will add low-dose spironolactone that should help with blood pressure control/diuresis/potassium conservation. 3.  Anemia of chronic kidney disease: Status post Aranesp on 5/26.  Unfortunately did not tolerate Feraheme on 5/27 with nausea/vomiting and some chest discomfort. 4.  Secondary hyperparathyroidism: Continue calcitriol for PTH control and renal diet for phosphorus control.  Subjective:   Reports that her breathing is back to baseline   Objective:   BP (!) 182/51   Pulse (!) 58   Temp 98 F (36.7 C) (Oral)   Resp 17   Ht 5\' 5"  (1.651 m)   Wt 84.9 kg   SpO2 97%   BMI 31.15 kg/m   Intake/Output Summary (Last 24 hours) at 11/23/2020 1037 Last data filed at 11/23/2020 2202 Gross per 24 hour  Intake 243 ml  Output 2425 ml  Net -2182 ml   Weight change: -0.635 kg  Physical Exam: Gen: Sitting comfortably on a chair next to her bed. CVS: Pulse regular rhythm, normal rate, S1 and S2 normal Resp: Fine rales bilaterally, no rhonchi/wheeze Abd: Soft, obese, nontender Ext: Trace bilateral pitting edema over legs.  Imaging: No results found.  Labs: BMET Recent Labs  Lab 11/18/20 1226 11/19/20 0214 11/20/20 0131 11/21/20 0304 11/22/20 0143 11/23/20 0308  NA 139 138 139 139 137 137  K 4.6 4.2 4.3 4.1 3.7  3.6  CL 109 110 111 106 104 102  CO2 20* 21* 22 24 24 25   GLUCOSE 169* 111* 90 130* 115* 139*  BUN 61* 61* 63* 64* 64* 66*  CREATININE 3.41* 3.30* 3.13* 3.13* 3.08* 3.18*  CALCIUM 8.6* 8.4* 8.5* 8.5* 8.3* 8.5*  PHOS  --  4.6 5.3* 4.7* 4.0 4.3   CBC Recent Labs  Lab 11/18/20 1226 11/19/20 0214 11/20/20 0131  WBC 5.2 3.9* 3.6*  NEUTROABS 4.0  --   --   HGB 8.8* 7.4* 7.6*  HCT 28.8* 23.5* 23.9*  MCV 95.4 92.2 92.3  PLT 170 133* 131*    Medications:    . amLODipine  10 mg Oral Daily  . arformoterol  15 mcg Nebulization BID  . atorvastatin  40 mg Oral QPM  . bisoprolol  5 mg Oral Daily  . calcitRIOL  0.25 mcg Oral Daily  . cloNIDine  0.3 mg Transdermal Weekly  . clopidogrel  75 mg Oral Q breakfast  . feeding supplement (NEPRO CARB STEADY)  237 mL Oral BID BM  . fluticasone  1 spray Each Nare Daily  . furosemide  80 mg Intravenous BID  . heparin  5,000 Units Subcutaneous Q8H  . hydrALAZINE  100 mg Oral TID  . insulin aspart  0-6 Units Subcutaneous TID WC  . insulin glargine  15 Units Subcutaneous QHS  . isosorbide mononitrate  60 mg Oral Daily  . montelukast  10 mg Oral QHS  . primidone  100 mg Oral Daily  .  sodium bicarbonate  650 mg Oral BID  . sodium chloride flush  3 mL Intravenous Q12H  . umeclidinium bromide  1 puff Inhalation Daily  . valACYclovir  1,000 mg Oral BID      Elmarie Shiley, MD 11/23/2020, 10:37 AM

## 2020-11-23 NOTE — Progress Notes (Signed)
PROGRESS NOTE    Joyce [name]  NKN:[id number] DOB: 1940/06/29 DOA: 11/18/2020 PCP: Allie Dimmer, MD    Chief Complaint  Patient presents with  . Shortness of Breath    Brief Narrative:   80 year old lady prior history of type 2 diabetes, coronary artery disease s/p CABG stage V CKD, hypertension fibromyalgia and hyperlipidemia presents to ED for progressive shortness of breath and edema chest x-ray showing progressive pulmonary edema nephrology consulted.   Assessment & Plan:   Principal Problem:   Volume overload Active Problems:   Stage 5 chronic kidney disease (HCC)   Essential hypertension   Diabetes mellitus type 2 in obese (HCC)   S/P CABG x 4   Diastolic CHF, chronic (HCC)   COPD with asthma (HCC)   Fibromyalgia   Volume overload secondary to progressive stage V CKD Diuresis with Lasix and metolazone. Transition to oral diuretics today and possible discharge tomorrow.  Continue with strict intake and output and daily weights.  Check ambulating oxygen levels tomorrow to evaluate for discharge on oxygen.    Acute on chronic diastolic heart failure Last echocardiogram from November 2021 showed preserved left ventricular ejection fraction and grade 1 diastolic dysfunction. Continue with Bumex 1 mg BID.    Hypertension;  Sub optimally controlled.  - continue with amlodipine, bisoprolol, Imdur. Spironolactone.   .   Insulin dependent DM: with hyperglycemia.  CBG (last 3)  Recent Labs    11/22/20 2000 11/23/20 0727 11/23/20 1126  GLUCAP 201* 143* 241*   Resume SSI.   Hyperlipidemia:  Continue with lipitor.   CAD s/p CABG:  Pt dnies any chest pain or sob today. Able to lay flat .    DVT prophylaxis: heparin.  Code Status: (Full code) Family Communication:none at bedside.  Disposition:   Status is: Inpatient  Remains inpatient appropriate because:Ongoing diagnostic testing needed not appropriate for outpatient work up and IV treatments  appropriate due to intensity of illness or inability to take PO   Dispo: The patient is from: Home              Anticipated d/c is to: Home              Patient currently is not medically stable to d/c.   Difficult to place patient No       Consultants:   Nephrology.    Procedures: None.    Antimicrobials: none    Subjective: No new complaints, wants to go home.   Objective: Vitals:   11/23/20 0340 11/23/20 0742 11/23/20 0816 11/23/20 0821  BP: (!) 182/51     Pulse:      Resp:      Temp:      TempSrc:      SpO2:  98% 100% 97%  Weight:      Height:        Intake/Output Summary (Last 24 hours) at 11/23/2020 1533 Last data filed at 11/23/2020 0835 Gross per 24 hour  Intake 243 ml  Output 2125 ml  Net -1882 ml   Filed Weights   11/21/20 0534 11/22/20 0617 11/23/20 0319  Weight: 86.1 kg 85.5 kg 84.9 kg    Examination:  General exam: Appears calm and comfortable  Respiratory system: Clear to auscultation. Respiratory effort normal. Cardiovascular system: S1 & S2 heard, RRR. No JVD,  No pedal edema. Gastrointestinal system: Abdomen is nondistended, soft and nontender. Normal bowel sounds heard. Central nervous system: Alert and oriented. No focal neurological deficits. Extremities: Symmetric 5 x 5  power. Skin: No rashes, lesions or ulcers Psychiatry: Mood & affect appropriate.     Data Reviewed: I have personally reviewed following labs and imaging studies  CBC: Recent Labs  Lab 11/18/20 1226 11/19/20 0214 11/20/20 0131  WBC 5.2 3.9* 3.6*  NEUTROABS 4.0  --   --   HGB 8.8* 7.4* 7.6*  HCT 28.8* 23.5* 23.9*  MCV 95.4 92.2 92.3  PLT 170 133* 131*    Basic Metabolic Panel: Recent Labs  Lab 11/19/20 0214 11/20/20 0131 11/21/20 0304 11/22/20 0143 11/23/20 0308  NA 138 139 139 137 137  K 4.2 4.3 4.1 3.7 3.6  CL 110 111 106 104 102  CO2 21* 22 24 24 25   GLUCOSE 111* 90 130* 115* 139*  BUN 61* 63* 64* 64* 66*  CREATININE 3.30* 3.13* 3.13*  3.08* 3.18*  CALCIUM 8.4* 8.5* 8.5* 8.3* 8.5*  MG  --   --   --   --  1.9  PHOS 4.6 5.3* 4.7* 4.0 4.3    GFR: Estimated Creatinine Clearance: 16 mL/min (A) (by C-G formula based on SCr of 3.18 mg/dL (H)).  Liver Function Tests: Recent Labs  Lab 11/18/20 1226 11/19/20 0214 11/20/20 0131 11/21/20 0304 11/22/20 0143 11/23/20 0308  AST 25  --   --   --   --   --   ALT 29  --   --   --   --   --   ALKPHOS 90  --   --   --   --   --   BILITOT 0.9  --   --   --   --   --   PROT 5.7*  --   --   --   --   --   ALBUMIN 3.2* 2.8* 2.7* 2.9* 2.7* 2.7*    CBG: Recent Labs  Lab 11/22/20 1203 11/22/20 1704 11/22/20 2000 11/23/20 0727 11/23/20 1126  GLUCAP 153* 172* 201* 143* 241*     Recent Results (from the past 240 hour(s))  Resp Panel by RT-PCR (Flu A&B, Covid) Nasopharyngeal Swab     Status: None   Collection Time: 11/18/20  1:28 PM   Specimen: Nasopharyngeal Swab; Nasopharyngeal(NP) swabs in vial transport medium  Result Value Ref Range Status   SARS Coronavirus 2 by RT PCR NEGATIVE NEGATIVE Final    Comment: (NOTE) SARS-CoV-2 target nucleic acids are NOT DETECTED.  The SARS-CoV-2 RNA is generally detectable in upper respiratory specimens during the acute phase of infection. The lowest concentration of SARS-CoV-2 viral copies this assay can detect is 138 copies/mL. A negative result does not preclude SARS-Cov-2 infection and should not be used as the sole basis for treatment or other patient management decisions. A negative result may occur with  improper specimen collection/handling, submission of specimen other than nasopharyngeal swab, presence of viral mutation(s) within the areas targeted by this assay, and inadequate number of viral copies(<138 copies/mL). A negative result must be combined with clinical observations, patient history, and epidemiological information. The expected result is Negative.  Fact Sheet for Patients:   EntrepreneurPulse.com.au  Fact Sheet for Healthcare Providers:  IncredibleEmployment.be  This test is no t yet approved or cleared by the [place] FDA and  has been authorized for detection and/or diagnosis of SARS-CoV-2 by FDA under an Emergency Use Authorization (EUA). This EUA will remain  in effect (meaning this test can be used) for the duration of the COVID-19 declaration under Section 564(b)(1) of the Act, 21 U.S.C.section 360bbb-3(b)(1), unless the  authorization is terminated  or revoked sooner.       Influenza A by PCR NEGATIVE NEGATIVE Final   Influenza B by PCR NEGATIVE NEGATIVE Final    Comment: (NOTE) The Xpert Xpress SARS-CoV-2/FLU/RSV plus assay is intended as an aid in the diagnosis of influenza from Nasopharyngeal swab specimens and should not be used as a sole basis for treatment. Nasal washings and aspirates are unacceptable for Xpert Xpress SARS-CoV-2/FLU/RSV testing.  Fact Sheet for Patients: EntrepreneurPulse.com.au  Fact Sheet for Healthcare Providers: IncredibleEmployment.be  This test is not yet approved or cleared by the [place] FDA and has been authorized for detection and/or diagnosis of SARS-CoV-2 by FDA under an Emergency Use Authorization (EUA). This EUA will remain in effect (meaning this test can be used) for the duration of the COVID-19 declaration under Section 564(b)(1) of the Act, 21 U.S.C. section 360bbb-3(b)(1), unless the authorization is terminated or revoked.  Performed at [facility], [place] [address], [place], [place] [address]          Radiology Studies: No results found.      Scheduled Meds: . amLODipine  10 mg Oral Daily  . arformoterol  15 mcg Nebulization BID  . atorvastatin  40 mg Oral QPM  . bisoprolol  5 mg Oral Daily  . bumetanide  1 mg Oral BID  . calcitRIOL  0.25 mcg Oral Daily  . cloNIDine  0.3 mg Transdermal  Weekly  . clopidogrel  75 mg Oral Q breakfast  . feeding supplement (NEPRO CARB STEADY)  237 mL Oral BID BM  . fluticasone  1 spray Each Nare Daily  . heparin  5,000 Units Subcutaneous Q8H  . hydrALAZINE  100 mg Oral TID  . insulin aspart  0-6 Units Subcutaneous TID WC  . insulin glargine  15 Units Subcutaneous QHS  . isosorbide mononitrate  60 mg Oral Daily  . montelukast  10 mg Oral QHS  . primidone  100 mg Oral Daily  . sodium bicarbonate  650 mg Oral BID  . sodium chloride flush  3 mL Intravenous Q12H  . spironolactone  12.5 mg Oral Daily  . umeclidinium bromide  1 puff Inhalation Daily  . valACYclovir  1,000 mg Oral BID   Continuous Infusions:   LOS: 4 days        Hosie Poisson, MD [facility]   To contact the attending provider between 7A-7P or the covering provider during after hours 7P-7A, please log into the web site [email or web address] and access using universal [place] password for that web site. If you do not have the password, please call the hospital operator.  11/23/2020, 3:33 PM

## 2020-11-24 DIAGNOSIS — N189 Chronic kidney disease, unspecified: Secondary | ICD-10-CM | POA: Diagnosis not present

## 2020-11-24 DIAGNOSIS — N179 Acute kidney failure, unspecified: Secondary | ICD-10-CM | POA: Diagnosis not present

## 2020-11-24 DIAGNOSIS — E8770 Fluid overload, unspecified: Secondary | ICD-10-CM | POA: Diagnosis not present

## 2020-11-24 LAB — RENAL FUNCTION PANEL
Albumin: 2.7 g/dL — ABNORMAL LOW (ref 3.5–5.0)
Anion gap: 10 (ref 5–15)
BUN: 73 mg/dL — ABNORMAL HIGH (ref 8–23)
CO2: 27 mmol/L (ref 22–32)
Calcium: 8.6 mg/dL — ABNORMAL LOW (ref 8.9–10.3)
Chloride: 98 mmol/L (ref 98–111)
Creatinine, Ser: 3.39 mg/dL — ABNORMAL HIGH (ref 0.44–1.00)
GFR, Estimated: 13 mL/min — ABNORMAL LOW
Glucose, Bld: 160 mg/dL — ABNORMAL HIGH (ref 70–99)
Phosphorus: 4.3 mg/dL (ref 2.5–4.6)
Potassium: 3.4 mmol/L — ABNORMAL LOW (ref 3.5–5.1)
Sodium: 135 mmol/L (ref 135–145)

## 2020-11-24 LAB — GLUCOSE, CAPILLARY
Glucose-Capillary: 194 mg/dL — ABNORMAL HIGH (ref 70–99)
Glucose-Capillary: 216 mg/dL — ABNORMAL HIGH (ref 70–99)

## 2020-11-24 MED ORDER — CLONIDINE 0.3 MG/24HR TD PTWK: 0.3000 mg | MEDICATED_PATCH | TRANSDERMAL | 12 refills | Status: DC

## 2020-11-24 MED ORDER — NEPRO/CARBSTEADY PO LIQD: 237.0000 mL | Freq: Two times a day (BID) | ORAL | 0 refills | Status: AC

## 2020-11-24 MED ORDER — INSULIN GLARGINE 100 UNIT/ML ~~LOC~~ SOLN: 30.0000 [IU] | Freq: Every day | SUBCUTANEOUS | 11 refills | Status: AC

## 2020-11-24 MED ORDER — BUMETANIDE 1 MG PO TABS: 1.0000 mg | ORAL_TABLET | Freq: Two times a day (BID) | ORAL | 1 refills | Status: DC

## 2020-11-24 MED ORDER — SPIRONOLACTONE 25 MG PO TABS: 12.5000 mg | ORAL_TABLET | Freq: Every day | ORAL | 1 refills | Status: DC

## 2020-11-24 NOTE — Progress Notes (Signed)
SATURATION QUALIFICATIONS: (This note is used to comply with regulatory documentation for home oxygen)  Patient Saturations on Room Air at Rest = 94%  Patient Saturations on Room Air while Ambulating = 91%  Patient Saturations on -- Liters of oxygen while Ambulating = N/A  Please briefly explain why patient needs home oxygen: Patient did not require supplemental oxygen to maintain saturations with activity.  Mabeline Caras, PT, DPT Acute Rehabilitation Services  Pager [phone] Office [phone]

## 2020-11-24 NOTE — Progress Notes (Signed)
Pt is alert and oriented. Discharge instructions/ AVS given to pt. 

## 2020-11-24 NOTE — Progress Notes (Signed)
Patient ID: Joyce [name], female   DOB: 08/11/1940, 80 y.o.   MRN: [id number] [facility] Progress Note   Assessment/ Plan:   1. Acute kidney Injury on CKD stage IV: Continues to have successful response to diuresis with urine output of at least 1.4 L overnight (patient reports urinating in the commode that was not collected) with corresponding improvement of respiratory status.  She is slightly higher creatinine as is typically seen with aggressive diuresis and does not have any acute electrolyte abnormalities or indications for dialysis at this time. She is stable from a renal standpoint to discharge home today on oral diuretics and outpatient follow-up with nephrology as scheduled. 2.  Hypertension: Elevated blood pressure noted on multiple agents-started yesterday on spironolactone along with conversion from intravenous furosemide to oral bumetanide.  Continue titration of therapy as an outpatient. 3.  Anemia of chronic kidney disease: Status post Aranesp on 5/26.  Unfortunately did not tolerate Feraheme on 5/27 with nausea/vomiting and some chest discomfort. 4.  Secondary hyperparathyroidism: Continue calcitriol for PTH control and renal diet for phosphorus control.  Subjective:   Reports that breathing is back to baseline and denies any chest pain or shortness of breath.  She informs me that she has an upcoming appointment with her nephrologist in 1 to 2 weeks and will follow-up earlier with her PCP.   Objective:   BP (!) 192/60   Pulse (!) 50   Temp 98 F (36.7 C) (Oral)   Resp 17   Ht 5\' 5"  (1.651 m)   Wt 84.6 kg   SpO2 94%   BMI 31.05 kg/m   Intake/Output Summary (Last 24 hours) at 11/24/2020 1000 Last data filed at 11/24/2020 0556 Gross per 24 hour  Intake --  Output 1150 ml  Net -1150 ml   Weight change: -0.272 kg  Physical Exam: Gen: Resting comfortably in bed, on oxygen via nasal cannula CVS: Pulse regular rhythm, normal rate, S1 and S2 normal Resp:  Anteriorly clear to auscultation bilaterally, no rhonchi/wheeze Abd: Soft, obese, nontender Ext: Trace bilateral pitting edema over ankles  Imaging: No results found.  Labs: BMET Recent Labs  Lab 11/18/20 1226 11/19/20 0214 11/20/20 0131 11/21/20 0304 11/22/20 0143 11/23/20 0308 11/24/20 0239  NA 139 138 139 139 137 137 135  K 4.6 4.2 4.3 4.1 3.7 3.6 3.4*  CL 109 110 111 106 104 102 98  CO2 20* 21* 22 24 24 25 27   GLUCOSE 169* 111* 90 130* 115* 139* 160*  BUN 61* 61* 63* 64* 64* 66* 73*  CREATININE 3.41* 3.30* 3.13* 3.13* 3.08* 3.18* 3.39*  CALCIUM 8.6* 8.4* 8.5* 8.5* 8.3* 8.5* 8.6*  PHOS  --  4.6 5.3* 4.7* 4.0 4.3 4.3   CBC Recent Labs  Lab 11/18/20 1226 11/19/20 0214 11/20/20 0131  WBC 5.2 3.9* 3.6*  NEUTROABS 4.0  --   --   HGB 8.8* 7.4* 7.6*  HCT 28.8* 23.5* 23.9*  MCV 95.4 92.2 92.3  PLT 170 133* 131*    Medications:    . amLODipine  10 mg Oral Daily  . arformoterol  15 mcg Nebulization BID  . atorvastatin  40 mg Oral QPM  . bisoprolol  5 mg Oral Daily  . bumetanide  1 mg Oral BID  . calcitRIOL  0.25 mcg Oral Daily  . cloNIDine  0.3 mg Transdermal Weekly  . clopidogrel  75 mg Oral Q breakfast  . feeding supplement (NEPRO CARB STEADY)  237 mL Oral BID BM  .  fluticasone  1 spray Each Nare Daily  . heparin  5,000 Units Subcutaneous Q8H  . hydrALAZINE  100 mg Oral TID  . insulin aspart  0-6 Units Subcutaneous TID WC  . insulin glargine  15 Units Subcutaneous QHS  . isosorbide mononitrate  60 mg Oral Daily  . montelukast  10 mg Oral QHS  . primidone  100 mg Oral Daily  . sodium bicarbonate  650 mg Oral BID  . sodium chloride flush  3 mL Intravenous Q12H  . spironolactone  12.5 mg Oral Daily  . umeclidinium bromide  1 puff Inhalation Daily  . valACYclovir  1,000 mg Oral BID      Elmarie Shiley, MD 11/24/2020, 10:00 AM

## 2020-11-24 NOTE — TOC Transition Note (Signed)
Transition of Care [facility]) - CM/SW Discharge Note   Patient Details  Name: Joyce [name] MRN: [id number] Date of Birth: 05-Apr-1941  [facility]) CM/SW Contact:  Ninfa Meeker, RN Phone Number: 11/24/2020, 1:16 PM   Clinical Narrative:   Patient will not need oxygen for home. CM has ordered a rollator as requested by therapist. No further needs identified.    Final next level of care: Home/Self Care Barriers to Discharge: No Barriers Identified   Patient Goals and CMS Choice        Discharge Placement                       Discharge Plan and Services   Discharge Planning Services: CM Consult            DME Arranged: Walker rolling with seat DME Agency: [employer] Date DME Agency Contacted: 11/24/20 Time DME Agency Contacted: [phone] Representative spoke with at DME Agency: Freda Munro HH Arranged: NA [place] Agency: NA        Social Determinants of Health ([place]) Interventions     Readmission Risk Interventions No flowsheet data found.

## 2020-11-24 NOTE — Discharge Summary (Signed)
Physician Discharge Summary  Joyce [name] WNU:[id number] DOB: [birth year] DOA: 11/18/2020  PCP: Allie Dimmer, MD  Admit date: 11/18/2020 Discharge date: 11/24/2020  Admitted From: Home.  Disposition:  Home.   Recommendations for Outpatient Follow-up:  1. Follow up with PCP in 1-2 weeks 2. Please obtain BMP/CBC in one week Please follow up with nephrology in one week.   Discharge Condition:stable.  CODE STATUS: full code.  Diet recommendation: Heart Healthy  Brief/Interim Summary: 80 year old lady prior history of type 2 diabetes, coronary artery disease s/p CABG stage V CKD, hypertension fibromyalgia and hyperlipidemia presents to ED for progressive shortness of breath and edema chest x-ray showing progressive pulmonary edema nephrology consulted.  Discharge Diagnoses:  Principal Problem:   Volume overload Active Problems:   Stage 5 chronic kidney disease (HCC)   Essential hypertension   Diabetes mellitus type 2 in obese (HCC)   S/P CABG x 4   Diastolic CHF, chronic (HCC)   COPD with asthma (HCC)   Fibromyalgia   Volume overload secondary to progressive stage V CKD Diuresis with bumex BID.  Transition to oral diuretics  Continue with strict intake and output and daily weights.     Acute on chronic diastolic heart failure Last echocardiogram from November 2021 showed preserved left ventricular ejection fraction and grade 1 diastolic dysfunction. Continue with Bumex 1 mg BID.    Hypertension;  Sub optimally controlled.  - continue with amlodipine, bisoprolol, Imdur. Spironolactone.   .   Insulin dependent DM: with hyperglycemia.  Resume home meds on discharge.    Hyperlipidemia:  Continue with lipitor.   CAD s/p CABG:  Pt dnies any chest pain or sob today. Able to lay flat .     Discharge Instructions  Discharge Instructions    Diet - low sodium heart healthy   Complete by: As directed    Discharge instructions   Complete by: As directed     Please follow up with nephrology as recommended.     Allergies as of 11/24/2020      Reactions   Carvedilol Other (See Comments)   confusion   Codeine Other (See Comments)   confusion   Linaclotide Nausea And Vomiting   Metoprolol Palpitations      Medication List    STOP taking these medications   HYDROmorphone 2 MG tablet Commonly known as: Dilaudid     TAKE these medications   Accu-Chek Aviva Plus test strip Generic drug: glucose blood Inject 1 strip as directed 4 (four) times daily.   albuterol (2.5 MG/3ML) 0.083% nebulizer solution Commonly known as: PROVENTIL Take 3 mLs (2.5 mg total) by nebulization every 6 (six) hours as needed for wheezing or shortness of breath.   albuterol 108 (90 Base) MCG/ACT inhaler Commonly known as: VENTOLIN HFA Inhale 2 puffs into the lungs every 4 (four) hours as needed for wheezing or shortness of breath.   amLODipine 10 MG tablet Commonly known as: NORVASC Take 10 mg by mouth daily.   atorvastatin 40 MG tablet Commonly known as: LIPITOR Take 40 mg by mouth every evening.   bisoprolol 5 MG tablet Commonly known as: ZEBETA Take 1 tablet (5 mg total) by mouth daily.   bumetanide 1 MG tablet Commonly known as: BUMEX Take 1 tablet (1 mg total) by mouth 2 (two) times daily. What changed: when to take this   busPIRone 5 MG tablet Commonly known as: BUSPAR Take 5 mg by mouth daily as needed (anxiety).   calcitRIOL 0.25 MCG capsule Commonly known  as: ROCALTROL Take 0.25 mcg by mouth daily.   cloNIDine 0.3 mg/24hr patch Commonly known as: CATAPRES - Dosed in mg/24 hr Place 1 patch (0.3 mg total) onto the skin once a week. Start taking on: November 29, 2020   clopidogrel 75 MG tablet Commonly known as: PLAVIX Take 1 tablet (75 mg total) by mouth daily with breakfast.   feeding supplement (NEPRO CARB STEADY) Liqd Take 237 mLs by mouth 2 (two) times daily between meals.   fluticasone 50 MCG/ACT nasal spray Commonly known as:  FLONASE Place 1 spray into both nostrils daily.   hydrALAZINE 100 MG tablet Commonly known as: APRESOLINE Take 100 mg by mouth 3 (three) times daily.   insulin glargine 100 UNIT/ML injection Commonly known as: LANTUS Inject 0.3 mLs (30 Units total) into the skin at bedtime. What changed:   when to take this  reasons to take this   insulin lispro 100 UNIT/ML KiwkPen Commonly known as: HUMALOG Inject 5 Units into the skin 3 (three) times daily with meals.   isosorbide mononitrate 60 MG 24 hr tablet Commonly known as: IMDUR TAKE 1 TABLET BY MOUTH DAILY   montelukast 10 MG tablet Commonly known as: SINGULAIR Take 10 mg by mouth at bedtime.   multivitamin with minerals Tabs tablet Take 1 tablet by mouth daily.   naloxone 4 MG/0.1ML Liqd nasal spray kit Commonly known as: NARCAN Place 1 spray into the nose daily as needed (accidental overdose.). Use as directed.   nitroGLYCERIN 0.4 MG SL tablet Commonly known as: NITROSTAT Place 1 tablet (0.4 mg total) under the tongue every 5 (five) minutes x 3 doses as needed for chest pain.   primidone 50 MG tablet Commonly known as: MYSOLINE Take 100 mg by mouth daily.   rOPINIRole 1 MG tablet Commonly known as: REQUIP Take 1 mg by mouth 3 (three) times daily as needed (muscle spasms).   sodium bicarbonate 650 MG tablet Take 650 mg by mouth 2 (two) times daily.   spironolactone 25 MG tablet Commonly known as: ALDACTONE Take 0.5 tablets (12.5 mg total) by mouth daily. Start taking on: November 25, 2020   Stiolto Respimat 2.5-2.5 MCG/ACT Aers Generic drug: Tiotropium Bromide-Olodaterol INHALE 2 PUFFS BY MOUTH DAILY What changed: See the new instructions.   traMADol 50 MG tablet Commonly known as: ULTRAM Take 100 mg by mouth every 8 (eight) hours as needed for moderate pain.   valACYclovir 1000 MG tablet Commonly known as: VALTREX Take 1,000 mg by mouth 2 (two) times daily.   Vitamin D3 50 MCG (2000 UT) Tabs Take 4,000-6,000  Units by mouth See admin instructions. Take 4000 units by mouth every other day alternating with 6000 units on alternate days       Follow-up Information    Allie Dimmer, MD. Schedule an appointment as soon as possible for a visit in 1 week(s).   Specialty: Internal Medicine Contact information: [address] [place] [address] [phone]        Satira Sark, MD .   Specialty: Cardiology Contact information: [place] [id number] [phone]              Allergies  Allergen Reactions  . Carvedilol Other (See Comments)    confusion  . Codeine Other (See Comments)    confusion  . Linaclotide Nausea And Vomiting  . Metoprolol Palpitations    Consultations:  Nephrology.    Procedures/Studies: DG Chest 2 View  Result Date: 11/18/2020 CLINICAL DATA:  Shortness of breath. EXAM: CHEST - 2 VIEW COMPARISON:  06/23/2020. FINDINGS: Prior CABG. Cardiomegaly. Diffuse bilateral interstitial prominence with Kerley B-lines, progressed from prior exam. Small bilateral pleural effusions. Findings most consistent with CHF. Pneumonitis cannot be excluded. No pneumothorax. Prior cervical spine fusion. IMPRESSION: Findings consistent CHF with progressive pulmonary interstitial edema. Electronically Signed   By: Marcello Moores  Register   On: 11/18/2020 12:58   US RENAL  Result Date: 11/18/2020 CLINICAL DATA:  Oliguria. EXAM: RENAL / URINARY TRACT ULTRASOUND COMPLETE COMPARISON:  CT 05/13/2019. FINDINGS: Right Kidney: Renal measurements: 12.9 x 5.4 x 5.2 cm = volume: 192 mL. Mild thinning of renal parenchyma and increased parenchymal echogenicity. No hydronephrosis. No focal lesion, previous tiny stone on CT is not seen. Left Kidney: Renal measurements: 11.0 x 6.2 x 5.3 cm = volume: 192 mL. Mild thinning of renal parenchyma and increased parenchymal echogenicity. No hydronephrosis, focal lesion or stone. Bladder: Appears normal for degree of bladder  distention. Other: None. IMPRESSION: 1. No obstructive uropathy. 2. Mild thinning of renal parenchyma and increased echogenicity consistent with chronic medical renal disease. Electronically Signed   By: Keith Rake M.D.   On: 11/18/2020 17:00       Subjective: Breathing has improved.  Discharge Exam: Vitals:   11/24/20 0835 11/24/20 0836  BP:    Pulse:    Resp:    Temp:    SpO2: 94% 94%   Vitals:   11/24/20 0500 11/24/20 0830 11/24/20 0835 11/24/20 0836  BP:  (!) 192/60    Pulse:  (!) 50    Resp:      Temp:      TempSrc:      SpO2:  93% 94% 94%  Weight: 84.6 kg     Height:        General: Pt is alert, awake, not in acute distress Cardiovascular: RRR, S1/S2 +, no rubs, no gallops Respiratory: CTA bilaterally, no wheezing, no rhonchi Abdominal: Soft, NT, ND, bowel sounds + Extremities: no edema, no cyanosis    The results of significant diagnostics from this hospitalization (including imaging, microbiology, ancillary and laboratory) are listed below for reference.     Microbiology: Recent Results (from the past 240 hour(s))  Resp Panel by RT-PCR (Flu A&B, Covid) Nasopharyngeal Swab     Status: None   Collection Time: 11/18/20  1:28 PM   Specimen: Nasopharyngeal Swab; Nasopharyngeal(NP) swabs in vial transport medium  Result Value Ref Range Status   SARS Coronavirus 2 by RT PCR NEGATIVE NEGATIVE Final    Comment: (NOTE) SARS-CoV-2 target nucleic acids are NOT DETECTED.  The SARS-CoV-2 RNA is generally detectable in upper respiratory specimens during the acute phase of infection. The lowest concentration of SARS-CoV-2 viral copies this assay can detect is 138 copies/mL. A negative result does not preclude SARS-Cov-2 infection and should not be used as the sole basis for treatment or other patient management decisions. A negative result may occur with  improper specimen collection/handling, submission of specimen other than nasopharyngeal swab, presence of  viral mutation(s) within the areas targeted by this assay, and inadequate number of viral copies(<138 copies/mL). A negative result must be combined with clinical observations, patient history, and epidemiological information. The expected result is Negative.  Fact Sheet for Patients:  EntrepreneurPulse.com.au  Fact Sheet for Healthcare Providers:  IncredibleEmployment.be  This test is no t yet approved or cleared by the [place] FDA and  has been authorized for detection and/or diagnosis of SARS-CoV-2 by FDA under an Emergency Use Authorization (  EUA). This EUA will remain  in effect (meaning this test can be used) for the duration of the COVID-19 declaration under Section 564(b)(1) of the Act, 21 U.S.C.section 360bbb-3(b)(1), unless the authorization is terminated  or revoked sooner.       Influenza A by PCR NEGATIVE NEGATIVE Final   Influenza B by PCR NEGATIVE NEGATIVE Final    Comment: (NOTE) The Xpert Xpress SARS-CoV-2/FLU/RSV plus assay is intended as an aid in the diagnosis of influenza from Nasopharyngeal swab specimens and should not be used as a sole basis for treatment. Nasal washings and aspirates are unacceptable for Xpert Xpress SARS-CoV-2/FLU/RSV testing.  Fact Sheet for Patients: EntrepreneurPulse.com.au  Fact Sheet for Healthcare Providers: IncredibleEmployment.be  This test is not yet approved or cleared by the [place] FDA and has been authorized for detection and/or diagnosis of SARS-CoV-2 by FDA under an Emergency Use Authorization (EUA). This EUA will remain in effect (meaning this test can be used) for the duration of the COVID-19 declaration under Section 564(b)(1) of the Act, 21 U.S.C. section 360bbb-3(b)(1), unless the authorization is terminated or revoked.  Performed at [facility], [place] [address], [place], [place] [address]      Labs: BNP (last 3  results) Recent Labs    05/04/20 1126 11/18/20 1227  BNP 554.0* 8,341.9*   Basic Metabolic Panel: Recent Labs  Lab 11/20/20 0131 11/21/20 0304 11/22/20 0143 11/23/20 0308 11/24/20 0239  NA 139 139 137 137 135  K 4.3 4.1 3.7 3.6 3.4*  CL 111 106 104 102 98  CO2 $Re'22 24 24 25 27  'tfN$ GLUCOSE 90 130* 115* 139* 160*  BUN 63* 64* 64* 66* 73*  CREATININE 3.13* 3.13* 3.08* 3.18* 3.39*  CALCIUM 8.5* 8.5* 8.3* 8.5* 8.6*  MG  --   --   --  1.9  --   PHOS 5.3* 4.7* 4.0 4.3 4.3   Liver Function Tests: Recent Labs  Lab 11/18/20 1226 11/19/20 0214 11/20/20 0131 11/21/20 0304 11/22/20 0143 11/23/20 0308 11/24/20 0239  AST 25  --   --   --   --   --   --   ALT 29  --   --   --   --   --   --   ALKPHOS 90  --   --   --   --   --   --   BILITOT 0.9  --   --   --   --   --   --   PROT 5.7*  --   --   --   --   --   --   ALBUMIN 3.2*   < > 2.7* 2.9* 2.7* 2.7* 2.7*   < > = values in this interval not displayed.   No results for input(s): LIPASE, AMYLASE in the last 168 hours. No results for input(s): AMMONIA in the last 168 hours. CBC: Recent Labs  Lab 11/18/20 1226 11/19/20 0214 11/20/20 0131  WBC 5.2 3.9* 3.6*  NEUTROABS 4.0  --   --   HGB 8.8* 7.4* 7.6*  HCT 28.8* 23.5* 23.9*  MCV 95.4 92.2 92.3  PLT 170 133* 131*   Cardiac Enzymes: No results for input(s): CKTOTAL, CKMB, CKMBINDEX, TROPONINI in the last 168 hours. BNP: Invalid input(s): POCBNP CBG: Recent Labs  Lab 11/23/20 1126 11/23/20 1628 11/23/20 2120 11/24/20 0741 11/24/20 1141  GLUCAP 241* 202* 170* 194* 216*   D-Dimer No results for input(s): DDIMER in the last 72 hours. Hgb  A1c No results for input(s): HGBA1C in the last 72 hours. Lipid Profile No results for input(s): CHOL, HDL, LDLCALC, TRIG, CHOLHDL, LDLDIRECT in the last 72 hours. Thyroid function studies No results for input(s): TSH, T4TOTAL, T3FREE, THYROIDAB in the last 72 hours.  Invalid input(s): FREET3 Anemia work up No results for  input(s): VITAMINB12, FOLATE, FERRITIN, TIBC, IRON, RETICCTPCT in the last 72 hours. Urinalysis    Component Value Date/Time   COLORURINE YELLOW 11/18/2020 1619   APPEARANCEUR CLEAR 11/18/2020 1619   LABSPEC 1.011 11/18/2020 1619   PHURINE 5.0 11/18/2020 1619   GLUCOSEU 50 (A) 11/18/2020 1619   HGBUR NEGATIVE 11/18/2020 1619   [place] 11/18/2020 [place] 11/18/2020 1619   PROTEINUR >=300 (A) 11/18/2020 1619   UROBILINOGEN 0.2 07/29/2014 2233   NITRITE NEGATIVE 11/18/2020 1619   LEUKOCYTESUR NEGATIVE 11/18/2020 1619   Sepsis Labs Invalid input(s): PROCALCITONIN,  WBC,  LACTICIDVEN Microbiology Recent Results (from the past 240 hour(s))  Resp Panel by RT-PCR (Flu A&B, Covid) Nasopharyngeal Swab     Status: None   Collection Time: 11/18/20  1:28 PM   Specimen: Nasopharyngeal Swab; Nasopharyngeal(NP) swabs in vial transport medium  Result Value Ref Range Status   SARS Coronavirus 2 by RT PCR NEGATIVE NEGATIVE Final    Comment: (NOTE) SARS-CoV-2 target nucleic acids are NOT DETECTED.  The SARS-CoV-2 RNA is generally detectable in upper respiratory specimens during the acute phase of infection. The lowest concentration of SARS-CoV-2 viral copies this assay can detect is 138 copies/mL. A negative result does not preclude SARS-Cov-2 infection and should not be used as the sole basis for treatment or other patient management decisions. A negative result may occur with  improper specimen collection/handling, submission of specimen other than nasopharyngeal swab, presence of viral mutation(s) within the areas targeted by this assay, and inadequate number of viral copies(<138 copies/mL). A negative result must be combined with clinical observations, patient history, and epidemiological information. The expected result is Negative.  Fact Sheet for Patients:  EntrepreneurPulse.com.au  Fact Sheet for Healthcare Providers:   IncredibleEmployment.be  This test is no t yet approved or cleared by the [place] FDA and  has been authorized for detection and/or diagnosis of SARS-CoV-2 by FDA under an Emergency Use Authorization (EUA). This EUA will remain  in effect (meaning this test can be used) for the duration of the COVID-19 declaration under Section 564(b)(1) of the Act, 21 U.S.C.section 360bbb-3(b)(1), unless the authorization is terminated  or revoked sooner.       Influenza A by PCR NEGATIVE NEGATIVE Final   Influenza B by PCR NEGATIVE NEGATIVE Final    Comment: (NOTE) The Xpert Xpress SARS-CoV-2/FLU/RSV plus assay is intended as an aid in the diagnosis of influenza from Nasopharyngeal swab specimens and should not be used as a sole basis for treatment. Nasal washings and aspirates are unacceptable for Xpert Xpress SARS-CoV-2/FLU/RSV testing.  Fact Sheet for Patients: EntrepreneurPulse.com.au  Fact Sheet for Healthcare Providers: IncredibleEmployment.be  This test is not yet approved or cleared by the [place] FDA and has been authorized for detection and/or diagnosis of SARS-CoV-2 by FDA under an Emergency Use Authorization (EUA). This EUA will remain in effect (meaning this test can be used) for the duration of the COVID-19 declaration under Section 564(b)(1) of the Act, 21 U.S.C. section 360bbb-3(b)(1), unless the authorization is terminated or revoked.  Performed at [facility], [place] [address], [place], [place] [address]      Time coordinating discharge:  36 minutes.  SIGNED:   Hosie Poisson, MD  [facility] 11/24/2020, 1:08 PM

## 2020-11-24 NOTE — Progress Notes (Signed)
Physical Therapy Treatment Patient Details Name: Joyce [name] MRN: [id number] DOB: Sep 06, 1940 Today's Date: 11/24/2020    History of Present Illness Pt is a 80 y.o. female admitted 11/18/20 with SOB, chest tightness, limited urine output, and generalized weakness. Workup for fluid overload and CKD stage 5; per nephrologist, preparing for PD. PMH significant for DM, HTN, CAD s/p CABG, fibromyalgia, COPD/asthma.   PT Comments    Pt progressing with mobility. Today's session focused on transfer and gait training with rollator for increased stability and energy conservation; recommend pt use of ambulating community distances due to limited activity tolerance; pt in agreement. SpO2 91-96% on RA with activity. Pt hopeful for d/c home today. Will continue to follow acutely.    Follow Up Recommendations  No PT follow up;Supervision - Intermittent     Equipment Recommendations   Rollator    Recommendations for Other Services       Precautions / Restrictions Precautions Precautions: Fall Restrictions Weight Bearing Restrictions: No    Mobility  Bed Mobility               General bed mobility comments: seated EOB    Transfers Overall transfer level: Modified independent Equipment used: None;4-wheeled walker             General transfer comment: Educ on use of rollator, locking/unlocking brakes  Ambulation/Gait Ambulation/Gait assistance: Supervision Gait Distance (Feet): 32 Feet (+76) Assistive device: None;4-wheeled walker Gait Pattern/deviations: Step-through pattern;Decreased stride length Gait velocity: decreased   General Gait Details: Slow, steady gait 32' without DME, guarded gait; additional gait trial 45' with rollator; supervision for safety/technique; distance limited by fatuige and SOB; DOE 3/4   Stairs             Wheelchair Mobility    Modified Rankin (Stroke Patients Only)       Balance Overall balance assessment: Needs  assistance Sitting-balance support: Feet supported;No upper extremity supported Sitting balance-Leahy Scale: Good       Standing balance-Leahy Scale: Fair Standing balance comment: Able to stand and walk short distances without UE support but does better with UE support for longer distances.                            Cognition Arousal/Alertness: Awake/alert Behavior During Therapy: WFL for tasks assessed/performed Overall Cognitive Status: Within Functional Limits for tasks assessed                                        Exercises      General Comments General comments (skin integrity, edema, etc.): SpO2 91-96% on RA      Pertinent Vitals/Pain Pain Assessment: No/denies pain    Home Living                      Prior Function            PT Goals (current goals can now be found in the care plan section) Progress towards PT goals: Progressing toward goals    Frequency    Min 3X/week      PT Plan Current plan remains appropriate    Co-evaluation              AM-PAC PT "6 Clicks" Mobility   Outcome Measure  Help needed turning from your back to your side while in a flat bed  without using bedrails?: None Help needed moving from lying on your back to sitting on the side of a flat bed without using bedrails?: None Help needed moving to and from a bed to a chair (including a wheelchair)?: A Little Help needed standing up from a chair using your arms (e.g., wheelchair or bedside chair)?: None Help needed to walk in hospital room?: A Little Help needed climbing 3-5 steps with a railing? : A Little 6 Click Score: 21    End of Session Equipment Utilized During Treatment: Oxygen Activity Tolerance: Patient tolerated treatment well Patient left: in bed;with call bell/phone within reach (seated EOB) Nurse Communication: Mobility status PT Visit Diagnosis: Difficulty in walking, not elsewhere classified (R26.2);Unsteadiness on  feet (R26.81)     Time: 3254-9826 PT Time Calculation (min) (ACUTE ONLY): 25 min  Charges:  $Gait Training: 8-22 mins $Therapeutic Activity: 8-22 mins                     Mabeline Caras, PT, DPT Acute Rehabilitation Services  Pager [phone] Office [place] 11/24/2020, 1:18 PM

## 2020-11-24 NOTE — Care Management Important Message (Signed)
Important Message  Patient Details  Name: Joyce [name] MRN: [id number] Date of Birth: February 10, 1941   Medicare Important Message Given:  Yes     Shelda Altes 11/24/2020, 9:56 AM

## 2020-12-01 ENCOUNTER — Ambulatory Visit (HOSPITAL_COMMUNITY): Payer: Medicare Other | Admitting: Physician Assistant

## 2020-12-10 NOTE — Progress Notes (Signed)
[place] [place], [place] [address]   CLINIC:  Medical Oncology/Hematology  PCP:  Allie Dimmer, [place] [address] [phone]   REASON FOR VISIT:  Follow-up for normocytic anemia  CURRENT THERAPY: Intermittent iron infusions  INTERVAL HISTORY:  [name] 80 y.o. female returns for routine follow-up of her normocytic anemia.  She was last seen in clinic by NP Francene Finders on 01/06/2020.  [name] was recently hospitalized from 11/18/2020 through 11/24/2020 due to volume overload secondary to progressive stage IV/V CKD as well as acute on chronic diastolic CHF.  While she was hospitalized, she received IV Feraheme on 11/19/2020, but did not tolerated due to nausea, vomiting, and chest discomfort.  She received Aranesp while hospitalized on 11/18/2020.  Most recent CBC (11/29/2020) shows Hgb 8.5/MCV 94.7.  Most recent iron panel (11/19/2020) shows ferritin 149, serum iron 28, iron saturation 14%.    She was started on hemodialysis last week, and will hopefully transition to peritoneal dialysis in about a month.    At today's visit, the patient reports feeling fair.  She is not sure if she receives regular Retacrit or other ESA from her nephrologist.  She no longer takes iron supplements at home due to constipation.    She has not noticed any recent bleeding such as epistaxis, hematuria, hematemesis, melena, or hematochezia.  She reports some dyspnea on exertion related to fluid overload, but states that dialysis has been improving the symptoms.  She denies chest pain, presyncopal episodes, palpitations.  She denies any B symptoms such as fever, chills, night sweats, and unintentional weight loss.  No new masses or lymphadenopathy. Denies recent thrombotic events.   Denies any nausea, vomiting, or diarrhea. Denies early satiety and abdominal pain. No new neurologic symptoms such as new-onset hearing loss, blurred vision,  headache, or dizziness.  Denies any numbness or tingling in hands or feet.   She has 40% energy and 75% appetite. She endorses that she is maintaining a relatively stable weight, although it fluctuates within a 10 pound range due to fluid overload.    REVIEW OF SYSTEMS:  Review of Systems  Constitutional:  Positive for fatigue. Negative for appetite change, chills, diaphoresis, fever and unexpected weight change.  HENT:   Negative for lump/mass and nosebleeds.   Eyes:  Negative for eye problems.  Respiratory:  Positive for shortness of breath. Negative for cough and hemoptysis.   Cardiovascular:  Positive for leg swelling. Negative for chest pain and palpitations.  Gastrointestinal:  Positive for constipation. Negative for abdominal pain, blood in stool, diarrhea, nausea and vomiting.  Genitourinary:  Negative for hematuria.   Skin: Negative.   Neurological:  Negative for dizziness, headaches and light-headedness.  Hematological:  Does not bruise/bleed easily.  Psychiatric/Behavioral:  Positive for sleep disturbance.      PAST MEDICAL/SURGICAL HISTORY:  Past Medical History:  Diagnosis Date   Anemia    Arthritis    Asthma    Bilateral pleural effusion    Postoperative, status post Pleurx catheter and talc treatment - Dr. Prescott Gum   CAD (coronary artery disease)    Multivessel status post CABG 05/2014 -  LIMA to LAD, SVG to diagonal, SVG to OM, SVG to PDA   Chronic kidney disease (CKD), stage III (moderate) (HCC)    Chronic lower back pain    COPD (chronic obstructive pulmonary disease) (HCC)    Essential hypertension    Fibromyalgia    History of pneumonia  Hyperlipidemia    Myocardial infarction ([place])    Type II diabetes mellitus [facility])    Past Surgical History:  Procedure Laterality Date   ABDOMINAL HYSTERECTOMY  1980's   ANTERIOR CERVICAL DECOMP/DISCECTOMY FUSION  2000's   APPENDECTOMY  1970's   BACK SURGERY     BIOPSY  08/28/2019   Procedure: BIOPSY;  Surgeon:  Rogene Houston, MD;  [facility];  Service: Endoscopy;;  duodenal   CHEST TUBE INSERTION Left 08/07/2014   Procedure: INSERTION PLEURAL DRAINAGE CATHETER;  Surgeon: Ivin Poot, MD;  Location: [place];  Service: Thoracic;  Laterality: Left;   CHEST TUBE INSERTION Right 08/12/2014   Procedure: INSERTION PLEURAL DRAINAGE CATHETER;  Surgeon: Ivin Poot, MD;  Location: [place];  Service: Thoracic;  Laterality: Right;   CHOLECYSTECTOMY  ~ 2005   COLONOSCOPY N/A 08/28/2019   Procedure: COLONOSCOPY;  Surgeon: Rogene Houston, MD;  [facility];  Service: Endoscopy;  Laterality: N/A;  100   CORONARY ARTERY BYPASS GRAFT N/A 06/15/2014   Procedure: CORONARY ARTERY BYPASS GRAFTING (CABG);  Surgeon: Ivin Poot, MD;  Location: [place];  Service: Open Heart Surgery;  Laterality: N/A;   CORONARY ATHERECTOMY N/A 05/22/2018   Procedure: CORONARY ATHERECTOMY;  Surgeon: Belva Crome, MD;  Location: [place] CV LAB;  Service: Cardiovascular;  Laterality: N/A;   CORONARY STENT INTERVENTION N/A 05/22/2018   Procedure: CORONARY STENT INTERVENTION;  Surgeon: Belva Crome, MD;  Location: [place] CV LAB;  Service: Cardiovascular;  Laterality: N/A;  rca    ESOPHAGOGASTRODUODENOSCOPY N/A 08/28/2019   Procedure: ESOPHAGOGASTRODUODENOSCOPY (EGD);  Surgeon: Rogene Houston, MD;  [facility];  Service: Endoscopy;  Laterality: N/A;   INTRAOPERATIVE TRANSESOPHAGEAL ECHOCARDIOGRAM N/A 06/15/2014   Procedure: INTRAOPERATIVE TRANSESOPHAGEAL ECHOCARDIOGRAM;  Surgeon: Ivin Poot, MD;  Location: [place];  Service: Open Heart Surgery;  Laterality: N/A;   LEFT HEART CATH AND CORS/GRAFTS ANGIOGRAPHY N/A 05/20/2018   Procedure: LEFT HEART CATH AND CORS/GRAFTS ANGIOGRAPHY;  Surgeon: Lorretta Harp, MD;  Location: [place] CV LAB;  Service: Cardiovascular;  Laterality: N/A;   LEFT HEART CATHETERIZATION WITH CORONARY ANGIOGRAM N/A 06/11/2014   Procedure: LEFT HEART CATHETERIZATION  WITH CORONARY ANGIOGRAM;  Surgeon: Blane Ohara, MD;  Location: [facility] CATH LAB;  Service: Cardiovascular;  Laterality: N/A;   LUMBAR DISC SURGERY  1980's X 1; 1990's X  2000's X 1   POLYPECTOMY  08/28/2019   Procedure: POLYPECTOMY;  Surgeon: Rogene Houston, MD;  [facility];  Service: Endoscopy;;   REMOVAL OF PLEURAL DRAINAGE CATHETER Bilateral 02/11/2015   Procedure: REMOVAL OF PLEURAL DRAINAGE CATHETER;  Surgeon: Ivin Poot, MD;  Location: [place];  Service: Thoracic;  Laterality: Bilateral;   TALC PLEURODESIS Bilateral 02/11/2015   Procedure: Pietro Cassis;  Surgeon: Ivin Poot, MD;  Location: [place];  Service: Thoracic;  Laterality: Bilateral;   TONSILLECTOMY  ~ [place] Right 02/02/2020   Procedure: RIGHT TOTAL HIP ARTHROPLASTY ANTERIOR APPROACH;  Surgeon: Frederik Pear, MD;  Location: [facility];  Service: Orthopedics;  Laterality: Right;     SOCIAL HISTORY:  Social History   Socioeconomic History   Marital status: Widowed    Spouse name: Not on file   Number of children: 2   Years of education: Not on file   Highest education level: Not on file  Occupational History   Occupation: retired  Tobacco Use   Smoking status: Never   Smokeless  tobacco: Never  Vaping Use   Vaping Use: Never used  Substance and Sexual Activity   Alcohol use: No    Alcohol/week: 0.0 standard drinks   Drug use: No   Sexual activity: Not on file  Other Topics Concern   Not on file  Social History Narrative   Not on file   Social Determinants of Health   Financial Resource Strain: Not on file  Food Insecurity: Not on file  Transportation Needs: Not on file  Physical Activity: Not on file  Stress: Not on file  Social Connections: Not on file  Intimate Partner Violence: Not on file    FAMILY HISTORY:  Family History  Problem Relation Age of Onset   Stroke Father    Diabetes Mother    Hypertension Mother    Alcoholism Brother    Gout Brother     Arthritis Brother    Alcoholism Brother    Heart disease Sister    Heart attack Sister    Cancer Sister        hodkins lymphoma   CAD Paternal Grandmother        heart attack   CAD Paternal Uncle        heart attack   Heart attack Paternal Grandfather    Osteoporosis Sister     CURRENT MEDICATIONS:  Outpatient Encounter Medications as of 12/13/2020  Medication Sig   ACCU-CHEK AVIVA PLUS test strip Inject 1 strip as directed 4 (four) times daily.   albuterol (PROVENTIL) (2.5 MG/3ML) 0.083% nebulizer solution Take 3 mLs (2.5 mg total) by nebulization every 6 (six) hours as needed for wheezing or shortness of breath.   albuterol (VENTOLIN HFA) 108 (90 Base) MCG/ACT inhaler Inhale 2 puffs into the lungs every 4 (four) hours as needed for wheezing or shortness of breath.   amLODipine (NORVASC) 10 MG tablet Take 10 mg by mouth daily.   atorvastatin (LIPITOR) 40 MG tablet Take 40 mg by mouth every evening.    bisoprolol (ZEBETA) 5 MG tablet Take 1 tablet (5 mg total) by mouth daily.   bumetanide (BUMEX) 1 MG tablet Take 1 tablet (1 mg total) by mouth 2 (two) times daily.   busPIRone (BUSPAR) 5 MG tablet Take 5 mg by mouth daily as needed (anxiety).   calcitRIOL (ROCALTROL) 0.25 MCG capsule Take 0.25 mcg by mouth daily.   Cholecalciferol (VITAMIN D3) 2000 units TABS Take 4,000-6,000 Units by mouth See admin instructions. Take 4000 units by mouth every other day alternating with 6000 units on alternate days   cloNIDine (CATAPRES - DOSED IN MG/24 HR) 0.3 mg/24hr patch Place 1 patch (0.3 mg total) onto the skin once a week.   clopidogrel (PLAVIX) 75 MG tablet Take 1 tablet (75 mg total) by mouth daily with breakfast.   fluticasone (FLONASE) 50 MCG/ACT nasal spray Place 1 spray into both nostrils daily.    hydrALAZINE (APRESOLINE) 100 MG tablet Take 100 mg by mouth 3 (three) times daily.   insulin glargine (LANTUS) 100 UNIT/ML injection Inject 0.3 mLs (30 Units total) into the skin at bedtime.    insulin lispro (HUMALOG) 100 UNIT/ML KiwkPen Inject 5 Units into the skin 3 (three) times daily with meals.   isosorbide mononitrate (IMDUR) 60 MG 24 hr tablet TAKE 1 TABLET BY MOUTH DAILY   montelukast (SINGULAIR) 10 MG tablet Take 10 mg by mouth at bedtime.   Multiple Vitamin (MULTIVITAMIN WITH MINERALS) TABS tablet Take 1 tablet by mouth daily.   naloxone (NARCAN) nasal spray 4  mg/0.1 mL Place 1 spray into the nose daily as needed (accidental overdose.). Use as directed.    nitroGLYCERIN (NITROSTAT) 0.4 MG SL tablet Place 1 tablet (0.4 mg total) under the tongue every 5 (five) minutes x 3 doses as needed for chest pain.   Nutritional Supplements (FEEDING SUPPLEMENT, NEPRO CARB STEADY,) LIQD Take 237 mLs by mouth 2 (two) times daily between meals.   primidone (MYSOLINE) 50 MG tablet Take 100 mg by mouth daily.    rOPINIRole (REQUIP) 1 MG tablet Take 1 mg by mouth 3 (three) times daily as needed (muscle spasms).   sodium bicarbonate 650 MG tablet Take 650 mg by mouth 2 (two) times daily.   spironolactone (ALDACTONE) 25 MG tablet Take 0.5 tablets (12.5 mg total) by mouth daily.   STIOLTO RESPIMAT 2.5-2.5 MCG/ACT AERS INHALE 2 PUFFS BY MOUTH DAILY (Patient taking differently: Take 2 puffs by mouth daily at 6 (six) AM.)   traMADol (ULTRAM) 50 MG tablet Take 100 mg by mouth every 8 (eight) hours as needed for moderate pain.    valACYclovir (VALTREX) 1000 MG tablet Take 1,000 mg by mouth 2 (two) times daily.   No facility-administered encounter medications on file as of 12/13/2020.    ALLERGIES:  Allergies  Allergen Reactions   Carvedilol Other (See Comments)    confusion   Codeine Other (See Comments)    confusion   Linaclotide Nausea And Vomiting   Metoprolol Palpitations     PHYSICAL EXAM:  ECOG PERFORMANCE STATUS: 2 - Symptomatic, <50% confined to bed  There were no vitals filed for this visit. There were no vitals filed for this visit. Physical Exam Constitutional:       Appearance: Normal appearance. She is obese.  HENT:     Head: Normocephalic and atraumatic.     Mouth/Throat:     Mouth: Mucous membranes are moist.  Eyes:     Extraocular Movements: Extraocular movements intact.     Pupils: Pupils are equal, round, and reactive to light.  Cardiovascular:     Rate and Rhythm: Normal rate and regular rhythm.     Pulses: Normal pulses.     Heart sounds: Normal heart sounds.  Pulmonary:     Effort: Pulmonary effort is normal.     Breath sounds: Rales (Faint crackles of bibasilar lung fields) present.  Abdominal:     General: Bowel sounds are normal.     Palpations: Abdomen is soft.     Tenderness: There is no abdominal tenderness.  Musculoskeletal:        General: No swelling.     Right lower leg: Edema (1+) present.     Left lower leg: Edema (1+) present.  Lymphadenopathy:     Cervical: No cervical adenopathy.  Skin:    General: Skin is warm and dry.  Neurological:     General: No focal deficit present.     Mental Status: She is alert and oriented to person, place, and time.  Psychiatric:        Mood and Affect: Mood normal.        Behavior: Behavior normal.     LABORATORY DATA:  I have reviewed the labs as listed.  CBC    Component Value Date/Time   WBC 3.6 (L) 11/20/2020 0131   RBC 2.59 (L) 11/20/2020 0131   HGB 7.6 (L) 11/20/2020 0131   HCT 23.9 (L) 11/20/2020 0131   PLT 131 (L) 11/20/2020 0131   MCV 92.3 11/20/2020 0131   MCH 29.3 11/20/2020  0131   MCHC 31.8 11/20/2020 0131   RDW 13.2 11/20/2020 0131   LYMPHSABS 0.8 11/18/2020 1226   MONOABS 0.3 11/18/2020 1226   EOSABS 0.1 11/18/2020 1226   BASOSABS 0.0 11/18/2020 1226   CMP Latest Ref Rng & Units 11/24/2020 11/23/2020 11/22/2020  Glucose 70 - 99 mg/dL 160(H) 139(H) 115(H)  BUN 8 - 23 mg/dL 73(H) 66(H) 64(H)  Creatinine 0.44 - 1.00 mg/dL 3.39(H) 3.18(H) 3.08(H)  Sodium 135 - 145 mmol/L 135 137 137  Potassium 3.5 - 5.1 mmol/L 3.4(L) 3.6 3.7  Chloride 98 - 111 mmol/L 98 102  104  CO2 22 - 32 mmol/L _0 Calcium 8.9 - 10.3 mg/dL 8.6(L) 8.5(L) 8.3(L)  Total Protein 6.5 - 8.1 g/dL - - -  Total Bilirubin 0.3 - 1.2 mg/dL - - -  Alkaline Phos 38 - 126 U/L - - -  AST 15 - 41 U/L - - -  ALT 0 - 44 U/L - - -    DIAGNOSTIC IMAGING:  I have independently reviewed the relevant imaging and discussed with the patient.  ASSESSMENT: 1.  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  CKD since 2012. - Combination anemia from CKD and relative iron deficiency. - She is taking oral iron tablets Monday Wednesday and Friday since November 2019.   - EGD on 08/28/2019 showing normal esophagus, 2 cm hiatal hernia, normal stomach, duodenal bulb and second part of the duodenum. - Colonoscopy on 08/28/2019 showed two 4 to 5 mm polyps at the splenic flexure and at the hepatic flexure, resected.  External hemorrhoids.  Anal Pap today were hypertrophied.  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 - She denies any easy bruising or bleeding.  She is on Plavix 75 mg daily. - Patient was worked up and found H85 and folic acid were normal.  SPEP is negative.  Stool for occult blood x3 is negative.  EPO was 5.0 (08/28/2019) - Last blood transfusion of 2 units PRBC in November 2019 when the patient had an MI. - Recently received Aranesp while hospitalized (Hgb 7.4), received Feraheme transfusion but did not tolerate it - Denies bright red blood per rectum or melena - Differential diagnosis favors anemia of CKD, but as she does also have mild leukopenia and thrombocytopenia cannot exclude early MDS - Most recent labs CBC (11/29/2020) shows Hgb 8.5/MCV 94.7.  Most recent iron panel (11/19/2020) shows ferritin 149, serum iron 28, iron saturation 14%.  Mild leukopenia with WBC 3.6, mild thrombocytopenia with platelets 131.  2.  CKD stage V, on dialysis - Follows with Dr. Alphonsa Gin in [place] --I discussed with patient's nephrologist on  12/13/2020, who reports that she was started on high-dose Epogen a week ago at the same time she started dialysis   PLAN:  1.  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  Most recent iron panel (11/19/2020) shows ferritin 149, serum iron 28, iron saturation 14%.  Mild leukopenia with WBC 3.6, mild thrombocytopenia with platelets 131. - No indication for IV iron at this time -She was recently started on Epogen when she started dialysis, being administered by her nephrologist - If patient fails to respond to ESA, will likely proceed with bone marrow biopsy - Repeat labs and follow-up with phone visit in 4 weeks  PLAN SUMMARY & DISPOSITION: - Receiving Epogen via nephrology office - Repeat labs and phone visit in 4 weeks  All questions were answered. The patient knows to call the clinic with any problems, questions or concerns.  Medical decision making: Moderate (1 chronic problem with exacerbation, discussion with outside provider, review of external records, review of previous labs, ordering new labs)  Time spent on visit: I spent 25 minutes counseling the patient face to face. The total time spent in the appointment was 40 minutes and more than 50% was on counseling.   Harriett Rush, PA-C  12/13/20 6:24 PM

## 2020-12-13 ENCOUNTER — Other Ambulatory Visit: Payer: Self-pay

## 2020-12-13 ENCOUNTER — Inpatient Hospital Stay (HOSPITAL_COMMUNITY): Payer: Medicare Other | Attending: Physician Assistant | Admitting: Physician Assistant

## 2020-12-13 DIAGNOSIS — Z79899 Other long term (current) drug therapy: Secondary | ICD-10-CM | POA: Insufficient documentation

## 2020-12-13 DIAGNOSIS — D631 Anemia in chronic kidney disease: Secondary | ICD-10-CM | POA: Diagnosis present

## 2020-12-13 DIAGNOSIS — Z992 Dependence on renal dialysis: Secondary | ICD-10-CM | POA: Diagnosis not present

## 2020-12-13 DIAGNOSIS — Z7902 Long term (current) use of antithrombotics/antiplatelets: Secondary | ICD-10-CM | POA: Diagnosis not present

## 2020-12-13 DIAGNOSIS — D696 Thrombocytopenia, unspecified: Secondary | ICD-10-CM | POA: Insufficient documentation

## 2020-12-13 DIAGNOSIS — D649 Anemia, unspecified: Secondary | ICD-10-CM

## 2020-12-13 DIAGNOSIS — I5032 Chronic diastolic (congestive) heart failure: Secondary | ICD-10-CM | POA: Diagnosis not present

## 2020-12-13 DIAGNOSIS — I110 Hypertensive heart disease with heart failure: Secondary | ICD-10-CM | POA: Insufficient documentation

## 2020-12-13 DIAGNOSIS — N186 End stage renal disease: Secondary | ICD-10-CM | POA: Insufficient documentation

## 2020-12-13 DIAGNOSIS — I252 Old myocardial infarction: Secondary | ICD-10-CM | POA: Diagnosis not present

## 2020-12-13 NOTE — Patient Instructions (Signed)
[place] at [facility] Discharge Instructions  You were seen today by Tarri Abernethy PA-C for your ANEMIA RELATED TO YOUR RENAL FAILURE.  I will touch base with your nephrologist, Dr. Nilda Calamity, regarding a medication called Epogen (or similar medication), which will help your body to make more blood.  We will recheck your blood and follow up with you in about a month.  LABS: Repeat CBC, iron, ferritin in 4 weeks (can be checked by nephrologist)   FOLLOW-UP APPOINTMENT: PHONE VISIT in 4 weeks   Thank you for choosing [place] at [facility] to provide your oncology and hematology care.  To afford each patient quality time with our provider, please arrive at least 15 minutes before your scheduled appointment time.   If you have a lab appointment with the [facility] please come in thru the Main Entrance and check in at the main information desk.  You need to re-schedule your appointment should you arrive 10 or more minutes late.  We strive to give you quality time with our providers, and arriving late affects you and other patients whose appointments are after yours.  Also, if you no show three or more times for appointments you may be dismissed from the clinic at the providers discretion.     Again, thank you for choosing [facility].  Our hope is that these requests will decrease the amount of time that you wait before being seen by our physicians.       _____________________________________________________________  Should you have questions after your visit to [facility], please contact our office at [phone] and follow the prompts.  Our office hours are 8:00 a.m. and 4:30 p.m. Monday - Friday.  Please note that voicemails left after 4:00 p.m. may not be returned until the following business day.  We are closed weekends and major holidays.  You do have access to a nurse 24-7, just call the main number to the  clinic [phone] and do not press any options, hold on the line and a nurse will answer the phone.    For prescription refill requests, have your pharmacy contact our office and allow 72 hours.    Due to Covid, you will need to wear a mask upon entering the hospital. If you do not have a mask, a mask will be given to you at the Main Entrance upon arrival. For doctor visits, patients may have 1 support person age 2 or older with them. For treatment visits, patients can not have anyone with them due to social distancing guidelines and our immunocompromised population.

## 2020-12-14 ENCOUNTER — Other Ambulatory Visit (HOSPITAL_COMMUNITY): Payer: Self-pay | Admitting: Surgery

## 2020-12-27 ENCOUNTER — Other Ambulatory Visit: Payer: Self-pay | Admitting: Cardiology

## 2021-01-14 ENCOUNTER — Inpatient Hospital Stay (HOSPITAL_COMMUNITY): Payer: Medicare Other | Attending: Physician Assistant | Admitting: Physician Assistant

## 2021-01-14 DIAGNOSIS — N186 End stage renal disease: Secondary | ICD-10-CM

## 2021-01-14 DIAGNOSIS — D631 Anemia in chronic kidney disease: Secondary | ICD-10-CM | POA: Diagnosis not present

## 2021-01-14 NOTE — Progress Notes (Signed)
VIRTUAL VISIT VIA TELEPHONE NOTE [facility]  I connected with Joyce [name] on 01/14/21  at 1:45 PM by telephone and verified that I am speaking with the correct person using two identifiers.  Location: Patient: Home Provider: [facility]   I discussed the limitations, risks, security and privacy concerns of performing an evaluation and management service by telephone and the availability of in person appointments. I also discussed with the patient that there may be a patient responsible charge related to this service. The patient expressed understanding and agreed to proceed.   HISTORY OF PRESENT ILLNESS: Joyce [name] 80 y.o. female returns for routine follow-up of her normocytic anemia.  She was last seen in clinic by Tarri Abernethy PA-C on 12/13/2020.  At today's visit, Joyce [name] reports that she is feeling overall improved.  She attributes this to having been started on hemodialysis, and she is excited to transition to peritoneal dialysis this week.  She is also been receiving Epogen injections with dialysis, and reports that she notes improved energy and appetite.   She has not noticed any recent bleeding such as epistaxis, hematuria, hematemesis, melena, or hematochezia.   She reports some chronic dyspnea on exertion related to fluid overload, but states that dialysis has been improving the symptoms.  She denies chest pain, presyncopal episodes, palpitations.  She denies any B symptoms such as fever, chills, night sweats, and unintentional weight loss.  No new masses or lymphadenopathy.  She reports chronic but improved fatigue, rates her energy as 70%.  She reports that her appetite is 100% and that she is eating well to maintain a stable weight at this time, although it does fluctuate somewhat within a 10 pound range due to fluid overload between dialysis sessions.    OBSERVATIONS/OBJECTIVE: Review of Systems  Constitutional:  Positive for fatigue  (70%). Negative for appetite change, chills, diaphoresis, fever and unexpected weight change.  HENT:   Negative for lump/mass and nosebleeds.   Eyes:  Negative for eye problems.  Respiratory:  Positive for shortness of breath (with exertion). Negative for cough and hemoptysis.   Cardiovascular:  Negative for chest pain, leg swelling and palpitations.  Gastrointestinal:  Negative for abdominal pain, blood in stool, constipation, diarrhea, nausea and vomiting.  Genitourinary:  Negative for hematuria.   Skin: Negative.   Neurological:  Negative for dizziness, headaches and light-headedness.  Hematological:  Does not bruise/bleed easily.    PHYSICAL EXAM (per limitations of virtual telephone visit): The patient is alert and oriented x 3, exhibiting adequate mentation, good mood, and ability to speak in full sentences and execute sound judgement.   ASSESSMENT & PLAN: 1.  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  CKD since 2012. - Combination anemia from CKD and relative iron deficiency. - She is taking oral iron tablets Monday Wednesday and Friday since November 2019.   - EGD on 08/28/2019 showing normal esophagus, 2 cm hiatal hernia, normal stomach, duodenal bulb and second part of the duodenum. - Colonoscopy on 08/28/2019 showed two 4 to 5 mm polyps at the splenic flexure and at the hepatic flexure, resected.  External hemorrhoids.  Anal papillae were hypertrophied.  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 - She denies any easy bruising or bleeding.  She is on Plavix 75 mg daily. No bright red blood per rectum or melena. - Patient was worked up and found S49 and folic acid were normal.  SPEP is negative.  Stool for occult blood x3 is negative.  EPO was 5.0 (08/28/2019) - Last blood transfusion of 2 units PRBC in November 2019 when the patient had an MI. - Received Aranesp while hospitalized in May 2022 (Hgb 7.4), received Feraheme transfusion  but did not tolerate it - Patient was started on high-dose Epogen in June 2022 by her nephrologist - Differential diagnosis favors anemia of CKD, but as she does also have mild leukopenia and thrombocytopenia cannot exclude early MDS - Most recent labs (01/06/2021): Show improved Hgb 9.8 (was Hgb 8.5 a month ago); WBC 3.6 with mild lymphopenia, slightly decreased platelets 142 - PLAN: Hgb is improved after being started on Epogen by nephrologist.  We will continue to monitor patient's CBC due to other abnormalities including mild leukopenia and thrombocytopenia.  If she shows any significant changes in baseline blood counts or stops responding to ESA, we would strongly consider bone marrow biopsy.  RTC in 4 months with repeat CBC and nutritional panel.  2.  CKD stage V, on dialysis - Follows with Dr. Alphonsa Gin in [place] --I discussed with patient's nephrologist on 12/13/2020, who reports that she was started on high-dose Epogen a week ago at the same time she started dialysis - PLAN: Continue receiving Epogen via nephrologist.   FOLLOW UP INSTRUCTIONS: -Continue Epogen via nephrologist - Labs and RTC in 4 months    I discussed the assessment and treatment plan with the patient. The patient was provided an opportunity to ask questions and all were answered. The patient agreed with the plan and demonstrated an understanding of the instructions.   The patient was advised to call back or seek an in-person evaluation if the symptoms worsen or if the condition fails to improve as anticipated.  I provided 13 minutes of non-face-to-face time during this encounter.   Harriett Rush, PA-C 01/14/2021 2:02 PM

## 2021-03-29 ENCOUNTER — Other Ambulatory Visit: Payer: Self-pay | Admitting: Cardiology

## 2021-04-07 ENCOUNTER — Ambulatory Visit: Payer: Medicare Other | Admitting: Emergency Medicine

## 2021-04-07 ENCOUNTER — Encounter: Payer: Self-pay | Admitting: Emergency Medicine

## 2021-04-07 ENCOUNTER — Other Ambulatory Visit: Payer: Self-pay

## 2021-04-07 DIAGNOSIS — J449 Chronic obstructive pulmonary disease, unspecified: Secondary | ICD-10-CM | POA: Diagnosis not present

## 2021-04-07 DIAGNOSIS — J301 Allergic rhinitis due to pollen: Secondary | ICD-10-CM | POA: Diagnosis not present

## 2021-04-07 DIAGNOSIS — R0609 Other forms of dyspnea: Secondary | ICD-10-CM

## 2021-04-07 NOTE — Assessment & Plan Note (Signed)
Continue Singulair and fluticasone nasal spray as you have been taking them.

## 2021-04-07 NOTE — Patient Instructions (Signed)
Please continue Stiolto 2 puffs once daily. Keep your albuterol available to use either 2 puffs or 1 nebulizer treatment up to every 4 hours if needed for shortness of breath, chest tightness, wheezing.  We will work on reordering your albuterol inhaler, may need to change the version to suit your insurance formulary Continue Singulair and fluticasone nasal spray as you have been taking them. Get your flu shot this fall as planned You would benefit from the new COVID-19 booster this fall Follow with Dr. Lamonte Sakai in 12 months or sooner if you have any problems.

## 2021-04-07 NOTE — Progress Notes (Signed)
Subjective:    Patient ID: Joyce [name], female    DOB: [birth year], 80 y.o.   MRN: [id number]   Shortness of Breath Pertinent negatives include no ear pain, fever, headaches, leg swelling, rash, rhinorrhea, sore throat, vomiting or wheezing.    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  At her last visit she was experiencing more dyspnea.  She was back on her Stiolto after having this stopped briefly postop hip surgery.  She reports today that she was hospitalized again since last time, at [facility], for acute on chronic CHF. Remains on Stiolto. She was diuresed effectively, improved. Now she is having progressive dyspnea and increase LE edema. Minimal cough. She is about to start more PT for her stamina.  COVID vaccine and flu shot up to date.   ROV 04/07/21 --80 year old woman with history of CAD/CABG, hypertension with diastolic dysfunction, chronic renal failure.  I have followed her for COPD/asthma with some associated restrictive lung disease.  She notes that she had to start peritoneal dialysis about 3 months ago, feels much better since she has initiated it.  She is still managed on Stiolto, Singulair, fluticasone nasal spray. She reports that her breathing is much better than our last visit. Rare albuterol use - her insurance stopped paying for it, ? Need a formulary change. No flares. Still has some nasal congestion every morning - minimal cough.  Getting flu shot in 2 weeks. COVID vaccine booster is due.    Review of Systems  Constitutional:  Negative for fever and unexpected weight change.  HENT:  Negative for congestion, dental problem, ear pain, nosebleeds, postnasal drip, rhinorrhea, sinus pressure, sneezing, sore throat and trouble swallowing.   Eyes:  Negative for redness and itching.  Respiratory:  Positive for shortness of breath. Negative for cough, chest tightness and  wheezing.   Cardiovascular:  Negative for palpitations and leg swelling.  Gastrointestinal:  Negative for nausea and vomiting.  Genitourinary:  Negative for dysuria.  Musculoskeletal:  Negative for joint swelling.  Skin:  Negative for rash.  Neurological:  Negative for headaches.  Hematological:  Does not bruise/bleed easily.  Psychiatric/Behavioral:  Negative for dysphoric mood. The patient is not nervous/anxious.       Objective:   Physical Exam Vitals:   04/07/21 1325  BP: (!) 170/52  Pulse: (!) 58  Resp: 18  Temp: 98.4 F (36.9 C)  TempSrc: Oral  SpO2: 95%  Weight: 183 lb 12.8 oz (83.4 kg)  Height: 5\' 5"  (1.651 m)   Gen: Pleasant, well-nourished, in no distress,  normal affect  ENT: No lesions,  mouth clear,  oropharynx clear, no postnasal drip  Neck: No JVD, no Stridor  Lungs: No use of accessory muscles, Good air movement, no wheeze or crackles  Cardiovascular: RRR, 3/6 syst M w intact S2  Musculoskeletal: No deformities, no cyanosis or clubbing  Neuro: alert, non focal  Skin: Warm, no lesions or rashes      Assessment & Plan:  COPD with asthma ([place]) Doing quite well.  She has not had any exacerbations.  Her overall functional capacity has been very positively impacted by starting peritoneal dialysis.  We will plan to continue her current BD regimen.  She needs a new order for albuterol, for insurance did not approve our most recent order, question whether she needs a formulary change.  Please continue Stiolto 2 puffs once daily. Keep your albuterol available to use  either 2 puffs or 1 nebulizer treatment up to every 4 hours if needed for shortness of breath, chest tightness, wheezing.  We will work on reordering your albuterol inhaler, may need to change the version to suit your insurance formulary Get your flu shot this fall as planned You would benefit from the new COVID-19 booster this fall Follow with Dr. Lamonte Sakai in 12 months or sooner if you have any  problems.   Allergic rhinitis Continue Singulair and fluticasone nasal spray as you have been taking them.  Dyspnea Multifactorial with known COPD and heart disease.  It seems she got significant benefit from volume removal after she was able to start peritoneal dialysis.  Baltazar Apo, MD, PhD 04/07/2021, 1:41 PM [facility] [phone] or if no answer [phone]

## 2021-04-07 NOTE — Assessment & Plan Note (Signed)
Multifactorial with known COPD and heart disease.  It seems she got significant benefit from volume removal after she was able to start peritoneal dialysis.

## 2021-04-07 NOTE — Assessment & Plan Note (Signed)
Doing quite well.  She has not had any exacerbations.  Her overall functional capacity has been very positively impacted by starting peritoneal dialysis.  We will plan to continue her current BD regimen.  She needs a new order for albuterol, for insurance did not approve our most recent order, question whether she needs a formulary change.  Please continue Stiolto 2 puffs once daily. Keep your albuterol available to use either 2 puffs or 1 nebulizer treatment up to every 4 hours if needed for shortness of breath, chest tightness, wheezing.  We will work on reordering your albuterol inhaler, may need to change the version to suit your insurance formulary Get your flu shot this fall as planned You would benefit from the new COVID-19 booster this fall Follow with Dr. Lamonte Sakai in 12 months or sooner if you have any problems.

## 2021-04-12 ENCOUNTER — Other Ambulatory Visit: Payer: Self-pay | Admitting: Cardiology

## 2021-04-13 NOTE — Telephone Encounter (Signed)
LMOM for patient to call the office. Need to verify if patient is taking this medication. Looks like it was discontinued at an office visit with Tarri Abernethy, PA-C at [facility] on 12/13/20.

## 2021-04-14 ENCOUNTER — Other Ambulatory Visit: Payer: Self-pay | Admitting: Cardiology

## 2021-04-14 ENCOUNTER — Other Ambulatory Visit: Payer: Self-pay | Admitting: *Deleted

## 2021-04-14 MED ORDER — BISOPROLOL FUMARATE 5 MG PO TABS: 5.0000 mg | ORAL_TABLET | Freq: Every day | ORAL | 0 refills | Status: DC

## 2021-05-01 NOTE — Progress Notes (Addendum)
Cardiology Office Note  Date: 05/02/2021   ID: Joyce [name], DOB [birth year], MRN [id number]  PCP:  Allie Dimmer, MD  Cardiologist:  Rozann Lesches, MD Electrophysiologist:  None   Chief Complaint: Follow-up atrial fibrillation [place] .  History of Present Illness: Joyce [name] is a 80 y.o. female with a history of HTN, NSTEMI, diastolic heart failure, CAD status post CABG 2015 (LIMA-LAD, SVG-diagonal, SVG-OM, SVG-PDA), pleural effusions, COPD with asthma, fatty liver disease, CKD stage IV, DM 2, HLD.  Recent hospitalization [facility] in [facility] 04/15/2021  for strangulated ventral hernia requiring small bowel resection and NG tube for ileus.  Ileus resolved and she was placed on hemodialysis.  PD catheter was removed and there was placement of tunneled dialysis catheter on 04/15/2021.  She was tolerating hemodialysis.  She was noted to be in atrial fibrillation which appeared to be a new diagnosis for her on 04/21/2021.  Rate was well controlled in the 90s.  Following hemodialysis on October 31 she went into atrial fibrillation with RVR which resolved after IV metoprolol.  On telemetry she remained in atrial fibrillation with rates in the 90s to 100.  Inpatient meds included Cardizem CD 120 mg daily, amlodipine 10 mg daily, Imdur 60 mg daily, spironolactone 12.5 mg daily, metoprolol tartrate 5 mg every 5 minutes as needed.  CHA2DS2-VASc score was 6.  Has bled score of 4.  She is here status post surgery for incarcerated hernia and removal of PD catheter.  States the site is healing well.  She any palpitations or arrhythmias.  She did go into atrial fibrillation during hospital stay.  Her EKG today shows normal sinus rhythm with a rate of 81, prolonged QT/QTc 420/487.  She has a right-sided permacath for dialysis.  Blood pressure today is 136/42.  She brings with her a log of blood pressures which appear to be running in the 935T to 017B systolic sometimes up to 939Q.  He states  her breathing is much better since she started hemodialysis.  She states they removed about 23 to 25 pounds and she is feeling much better.  She states the plan is months the abdomen heals they will place another PD catheter.  Her current medications include bisoprolol 5 mg daily, Bumex 1 mg p.o. twice daily, Plavix 75 mg daily, diltiazem 240 mg p.o. daily, Imdur 60 mg daily.  Sublingual nitroglycerin as needed, spironolactone 12.5 mg p.o. daily.  We discussed starting anticoagulation based on recent evidence of atrial fibrillation.      Past Medical History:  Diagnosis Date   Anemia    Arthritis    Asthma    Bilateral pleural effusion    Postoperative, status post Pleurx catheter and talc treatment - Dr. Prescott Gum   CAD (coronary artery disease)    Multivessel status post CABG 05/2014 -  LIMA to LAD, SVG to diagonal, SVG to OM, SVG to PDA   Chronic kidney disease (CKD), stage III (moderate) (HCC)    Chronic lower back pain    COPD (chronic obstructive pulmonary disease) ([place])    Essential hypertension    Fibromyalgia    History of pneumonia    Hyperlipidemia    Myocardial infarction ([place])    Type II diabetes mellitus ([place])     Past Surgical History:  Procedure Laterality Date   ABDOMINAL HYSTERECTOMY  1980's   ANTERIOR CERVICAL DECOMP/DISCECTOMY FUSION  2000's   APPENDECTOMY  1970's   BACK SURGERY     BIOPSY  08/28/2019  Procedure: BIOPSY;  Surgeon: Rogene Houston, MD;  [facility];  Service: Endoscopy;;  duodenal   CHEST TUBE INSERTION Left 08/07/2014   Procedure: INSERTION PLEURAL DRAINAGE CATHETER;  Surgeon: Ivin Poot, MD;  Location: [place];  Service: Thoracic;  Laterality: Left;   CHEST TUBE INSERTION Right 08/12/2014   Procedure: INSERTION PLEURAL DRAINAGE CATHETER;  Surgeon: Ivin Poot, MD;  Location: [place];  Service: Thoracic;  Laterality: Right;   CHOLECYSTECTOMY  ~ 2005   COLONOSCOPY N/A 08/28/2019   Procedure: COLONOSCOPY;  Surgeon: Rogene Houston, MD;  [facility];  Service: Endoscopy;  Laterality: N/A;  100   CORONARY ARTERY BYPASS GRAFT N/A 06/15/2014   Procedure: CORONARY ARTERY BYPASS GRAFTING (CABG);  Surgeon: Ivin Poot, MD;  Location: [place];  Service: Open Heart Surgery;  Laterality: N/A;   CORONARY ATHERECTOMY N/A 05/22/2018   Procedure: CORONARY ATHERECTOMY;  Surgeon: Belva Crome, MD;  Location: [place] [facility];  Service: Cardiovascular;  Laterality: N/A;   CORONARY STENT INTERVENTION N/A 05/22/2018   Procedure: CORONARY STENT INTERVENTION;  Surgeon: Belva Crome, MD;  Location: [place] [facility];  Service: Cardiovascular;  Laterality: N/A;  rca    ESOPHAGOGASTRODUODENOSCOPY N/A 08/28/2019   Procedure: ESOPHAGOGASTRODUODENOSCOPY (EGD);  Surgeon: Rogene Houston, MD;  [facility];  Service: Endoscopy;  Laterality: N/A;   INTRAOPERATIVE TRANSESOPHAGEAL ECHOCARDIOGRAM N/A 06/15/2014   Procedure: INTRAOPERATIVE TRANSESOPHAGEAL ECHOCARDIOGRAM;  Surgeon: Ivin Poot, MD;  Location: [place];  Service: Open Heart Surgery;  Laterality: N/A;   LEFT HEART CATH AND CORS/GRAFTS ANGIOGRAPHY N/A 05/20/2018   Procedure: LEFT HEART CATH AND CORS/GRAFTS ANGIOGRAPHY;  Surgeon: Lorretta Harp, MD;  Location: [place] [facility];  Service: Cardiovascular;  Laterality: N/A;   LEFT HEART CATHETERIZATION WITH CORONARY ANGIOGRAM N/A 06/11/2014   Procedure: LEFT HEART CATHETERIZATION WITH CORONARY ANGIOGRAM;  Surgeon: Blane Ohara, MD;  Location: [facility];  Service: Cardiovascular;  Laterality: N/A;   LUMBAR DISC SURGERY  1980's X 1; 1990's X  2000's X 1   POLYPECTOMY  08/28/2019   Procedure: POLYPECTOMY;  Surgeon: Rogene Houston, MD;  [facility];  Service: Endoscopy;;   REMOVAL OF PLEURAL DRAINAGE CATHETER Bilateral 02/11/2015   Procedure: REMOVAL OF PLEURAL DRAINAGE CATHETER;  Surgeon: Ivin Poot, MD;  Location: [place];  Service: Thoracic;  Laterality: Bilateral;   TALC PLEURODESIS  Bilateral 02/11/2015   Procedure: Pietro Cassis;  Surgeon: Ivin Poot, MD;  Location: [place];  Service: Thoracic;  Laterality: Bilateral;   TONSILLECTOMY  ~ [place] Right 02/02/2020   Procedure: RIGHT TOTAL HIP ARTHROPLASTY ANTERIOR APPROACH;  Surgeon: Frederik Pear, MD;  Location: [facility];  Service: Orthopedics;  Laterality: Right;    Current Outpatient Medications  Medication Sig Dispense Refill   ACCU-CHEK AVIVA PLUS test strip Inject 1 strip as directed 4 (four) times daily.  2   albuterol (PROVENTIL) (2.5 MG/3ML) 0.083% nebulizer solution Take 3 mLs (2.5 mg total) by nebulization every 6 (six) hours as needed for wheezing or shortness of breath. 75 mL 12   albuterol (VENTOLIN HFA) 108 (90 Base) MCG/ACT inhaler Inhale 2 puffs into the lungs every 4 (four) hours as needed for wheezing or shortness of breath. 8 g 5   atorvastatin (LIPITOR) 40 MG tablet Take 40 mg by mouth every evening.      bisoprolol (ZEBETA) 5 MG tablet Take 1 tablet (5 mg total) by  mouth daily. 30 tablet 0   bumetanide (BUMEX) 1 MG tablet Take 1 tablet (1 mg total) by mouth 2 (two) times daily. 60 tablet 1   busPIRone (BUSPAR) 5 MG tablet Take 5 mg by mouth daily as needed (anxiety).     calcitRIOL (ROCALTROL) 0.25 MCG capsule Take 0.25 mcg by mouth daily.     Cholecalciferol (VITAMIN D3) 2000 units TABS Take 4,000-6,000 Units by mouth See admin instructions. Take 4000 units by mouth every other day alternating with 6000 units on alternate days     clopidogrel (PLAVIX) 75 MG tablet TAKE ONE TABLET BY MOUTH DAILY WITH BREAKFAST 60 tablet 0   diltiazem (CARDIZEM CD) 240 MG 24 hr capsule Take 240 mg by mouth daily.     fluticasone (FLONASE) 50 MCG/ACT nasal spray Place 1 spray into both nostrils daily.      HYDROcodone-acetaminophen (NORCO/VICODIN) 5-325 MG tablet Take 1 tablet by mouth every 6 (six) hours as needed.     insulin glargine (LANTUS) 100 UNIT/ML injection Inject 0.3 mLs (30 Units total)  into the skin at bedtime. 10 mL 11   insulin lispro (HUMALOG) 100 UNIT/ML KiwkPen Inject 5 Units into the skin 3 (three) times daily with meals.     isosorbide mononitrate (IMDUR) 60 MG 24 hr tablet TAKE ONE TABLET BY MOUTH DAILY 90 tablet 2   montelukast (SINGULAIR) 10 MG tablet Take 10 mg by mouth at bedtime.     Multiple Vitamin (MULTIVITAMIN WITH MINERALS) TABS tablet Take 1 tablet by mouth daily.     naloxone (NARCAN) nasal spray 4 mg/0.1 mL Place 1 spray into the nose daily as needed (accidental overdose.). Use as directed.     nitroGLYCERIN (NITROSTAT) 0.4 MG SL tablet Place 1 tablet (0.4 mg total) under the tongue every 5 (five) minutes x 3 doses as needed for chest pain. 25 tablet 3   primidone (MYSOLINE) 50 MG tablet Take 100 mg by mouth daily.   5   rOPINIRole (REQUIP) 1 MG tablet Take 1 mg by mouth 3 (three) times daily as needed (muscle spasms).     sodium bicarbonate 650 MG tablet Take 650 mg by mouth 2 (two) times daily.     spironolactone (ALDACTONE) 25 MG tablet Take 12.5 mg by mouth daily.     STIOLTO RESPIMAT 2.5-2.5 MCG/ACT AERS INHALE 2 PUFFS BY MOUTH DAILY (Patient taking differently: Take 2 puffs by mouth daily at 6 (six) AM.) 4 g 6   traMADol (ULTRAM) 50 MG tablet Take 100 mg by mouth every 8 (eight) hours as needed for moderate pain.      valACYclovir (VALTREX) 1000 MG tablet Take 1,000 mg by mouth 2 (two) times daily.     No current facility-administered medications for this visit.   Allergies:  Carvedilol, Codeine, Linaclotide, and Metoprolol   Social History: The patient  reports that she has never smoked. She has never used smokeless tobacco. She reports that she does not drink alcohol and does not use drugs.   Family History: The patient's family history includes Alcoholism in her brother and brother; Arthritis in her brother; CAD in her paternal grandmother and paternal uncle; Cancer in her sister; Diabetes in her mother; Gout in her brother; Heart attack in her  paternal grandfather and sister; Heart disease in her sister; Hypertension in her mother; Osteoporosis in her sister; Stroke in her father.   ROS:  Please see the history of present illness. Otherwise, complete review of systems is positive for none.  All  other systems are reviewed and negative.   Physical Exam: VS:  BP (!) 136/42   Pulse 81   Ht 5\' 5"  (1.651 m)   Wt 177 lb (80.3 kg)   BMI 29.45 kg/m , BMI Body mass index is 29.45 kg/m.  Wt Readings from Last 3 Encounters:  05/02/21 177 lb (80.3 kg)  04/07/21 183 lb 12.8 oz (83.4 kg)  11/24/20 186 lb 9.6 oz (84.6 kg)    General: Patient appears comfortable at rest. Neck: Supple, no elevated JVP or carotid bruits, no thyromegaly. Lungs: Clear to auscultation, nonlabored breathing at rest. Cardiac: Regular rate and rhythm, no S3 or significant systolic murmur, no pericardial rub. Extremities: No pitting edema, distal pulses 2+. Skin: Warm and dry.  Tunneled dialysis catheter in right upper chest. Musculoskeletal: No kyphosis. Neuropsychiatric: Alert and oriented x3, affect grossly appropriate.  ECG: November 7 EKG normal sinus rhythm rate of 81, prolonged QT.  Recent Labwork: 11/18/2020: ALT 29; AST 25; B Natriuretic Peptide 1,340.4 11/20/2020: Hemoglobin 7.6; Platelets 131 11/23/2020: Magnesium 1.9 11/24/2020: BUN 73; Creatinine, Ser 3.39; Potassium 3.4; Sodium 135     Component Value Date/Time   CHOL 111 05/18/2018 0418   TRIG 116 05/18/2018 0418   HDL 41 05/18/2018 0418   CHOLHDL 2.7 05/18/2018 0418   VLDL 23 05/18/2018 0418   LDLCALC 47 05/18/2018 0418    Other Studies Reviewed Today:  Echocardiogram 04/26/2021 [facility] Summary   1. Overall left ventricular ejection fraction is estimated at 60 to 65%.   2. Normal global left ventricular systolic function.   3. Moderate concentric left ventricular hypertrophy.   4. Normal left ventricular diastolic filling.   5. Biplane EF 66%. 3D LVEF 62%.   6. TAPSE 22 mm c/w  NL RVF.   7. There is no evidence of pericardial effusion.   8. Mild aortic stenosis.   9. There is moderate aortic valve sclerosis.  10. No intracardiac thrombi, mass or vegetations.    Assessment and Plan:  1. Atrial fibrillation, new onset ([place])   2. CAD in native artery   3. Chronic diastolic heart failure ([place])   4. ESRD (end stage renal disease) on dialysis ([place])   5. Mixed hyperlipidemia    1. Atrial fibrillation, new onset [facility]) Recent new onset of atrial fibrillation during hospitalization in [facility].  She had strangulated ventral hernia repair and was noted to be in atrial fibrillation.  Later on she went into a atrial fibrillation with RVR managed with metoprolol.  She was discharged on Cardizem.  She was not started on anticoagulant at that time.  She has been on Plavix for CAD 75 mg daily.  Continue Cardizem CD 240 mg daily.  Stop Plavix and start Eliquis 5 mg po bid.  She is in normal sinus rhythm today with a rate of 81.  2. CAD in native artery Denies any current anginal symptoms.  History of bypass CAD status post CABG 2015 (LIMA-LAD, SVG-diagonal, SVG-OM, SVG-PDA), no current anginal symptoms.  Continue Imdur 60 mg p.o. daily.  Continue sublingual nitroglycerin as needed.  3. Chronic diastolic heart failure ([place]) Recent echocardiogram 04/26/2021 at [facility].  EF 60 to 65%.  Moderate concentric LVH, biplane EF 66%, 3D LV EF 62%, TAPSE 22 mm consistent with normal RVEF.  Mild aortic stenosis, moderate aortic valve sclerosis, no intracardiac thrombi, mass, or vegetations.  Fluid management via dialysis.  She has an indwelling tunneled dialysis catheter in right upper chest.  Continue Bumex 1 mg p.o.  daily.  Continue spironolactone 12.5 mg p.o. daily.  Increase bisoprolol to 10 mg daily.  4. ESRD (end stage renal disease) on dialysis ([place]) States she is feeling better since being dialyzed on hemodialysis.  She states she is lost about 23 pounds since hemodialysis  was started.  Plan is to restart PD after wound is healed from ventral strangulated hernia where PD catheter was previously located.  5. Mixed hyperlipidemia Continue atorvastatin 40 mg p.o. daily.  Medication Adjustments/Labs and Tests Ordered: Current medicines are reviewed at length with the patient today.  Concerns regarding medicines are outlined above.   Disposition: Follow-up with Dr. Domenic Polite or APP 1 month  Signed, Levell July, NP 05/02/2021 9:31 AM    [facility], [place], [place] [address] Phone: [phone]; Fax: [phone]

## 2021-05-02 ENCOUNTER — Ambulatory Visit: Payer: Medicare Other | Admitting: Family Medicine

## 2021-05-02 ENCOUNTER — Other Ambulatory Visit: Payer: Self-pay | Admitting: *Deleted

## 2021-05-02 ENCOUNTER — Encounter: Payer: Self-pay | Admitting: Family Medicine

## 2021-05-02 VITALS — BP 136/42 | HR 81 | Ht 65.0 in | Wt 177.0 lb

## 2021-05-02 DIAGNOSIS — I5032 Chronic diastolic (congestive) heart failure: Secondary | ICD-10-CM | POA: Diagnosis not present

## 2021-05-02 DIAGNOSIS — I4891 Unspecified atrial fibrillation: Secondary | ICD-10-CM | POA: Diagnosis not present

## 2021-05-02 DIAGNOSIS — Z992 Dependence on renal dialysis: Secondary | ICD-10-CM

## 2021-05-02 DIAGNOSIS — E782 Mixed hyperlipidemia: Secondary | ICD-10-CM

## 2021-05-02 DIAGNOSIS — I2510 Atherosclerotic heart disease of native coronary artery without angina pectoris: Secondary | ICD-10-CM | POA: Diagnosis not present

## 2021-05-02 DIAGNOSIS — N186 End stage renal disease: Secondary | ICD-10-CM

## 2021-05-02 MED ORDER — APIXABAN 2.5 MG PO TABS: 2.5000 mg | ORAL_TABLET | Freq: Two times a day (BID) | ORAL | 1 refills | Status: DC

## 2021-05-02 MED ORDER — BISOPROLOL FUMARATE 10 MG PO TABS: 10.0000 mg | ORAL_TABLET | Freq: Every day | ORAL | 1 refills | Status: DC

## 2021-05-02 MED ORDER — APIXABAN 5 MG PO TABS: 5.0000 mg | ORAL_TABLET | Freq: Two times a day (BID) | ORAL | 1 refills | Status: DC

## 2021-05-02 NOTE — Addendum Note (Signed)
Addended by: Claude Manges on: 05/02/2021 04:51 PM   Modules accepted: Orders

## 2021-05-02 NOTE — Patient Instructions (Signed)
Medication Instructions:   START TAKING  ELIQUIS 2.5 MG TWICE A DAY   START TAKING BISOPROLOL 10 MG ONCE A DAY   STOP TAKING PLAVIX  75 MG    *If you need a refill on your cardiac medications before your next appointment, please call your pharmacy*   Lab Work:  NONE ORDERED  TODAY   If you have labs (blood work) drawn today and your tests are completely normal, you will receive your results only by: [place] (if you have MyChart) OR A paper copy in the mail If you have any lab test that is abnormal or we need to change your treatment, we will call you to review the results.   Testing/Procedures: NONE ORDERED  TODAY     Follow-Up: At [facility], you and your health needs are our priority.  As part of our continuing mission to provide you with exceptional heart care, we have created designated Provider Care Teams.  These Care Teams include your primary Cardiologist (physician) and Advanced Practice Providers (APPs -  Physician Assistants and Nurse Practitioners) who all work together to provide you with the care you need, when you need it.  We recommend signing up for the patient portal called "MyChart".  Sign up information is provided on this After Visit Summary.  MyChart is used to connect with patients for Virtual Visits (Telemedicine).  Patients are able to view lab/test results, encounter notes, upcoming appointments, etc.  Non-urgent messages can be sent to your provider as well.   To learn more about what you can do with MyChart, go to [email or web address].    Your next appointment:   1 month(s)  The format for your next appointment:   In Person  Provider:   You may see Rozann Lesches, MD or the following Advanced Practice Provider on your designated Care Team:   Katina Dung, NP

## 2021-05-02 NOTE — Progress Notes (Signed)
i

## 2021-05-03 ENCOUNTER — Other Ambulatory Visit: Payer: Self-pay | Admitting: *Deleted

## 2021-05-03 MED ORDER — APIXABAN 5 MG PO TABS: 5.0000 mg | ORAL_TABLET | Freq: Two times a day (BID) | ORAL | 1 refills | Status: DC

## 2021-05-03 NOTE — Telephone Encounter (Signed)
Prescription refill request for Eliquis received. Indication:  Atrial fib Last office visit: 05/02/21  Lawanda Cousins FNP Scr: 3.64 on 05/02/21 Age: 80 Weight: 80.3kg  Based on above findings Eliquis 5mg  twice daily is the appropriate dose.  Refill approved.

## 2021-05-16 ENCOUNTER — Inpatient Hospital Stay (HOSPITAL_COMMUNITY): Payer: Medicare Other | Attending: Hematology

## 2021-05-23 ENCOUNTER — Inpatient Hospital Stay (HOSPITAL_COMMUNITY): Payer: Medicare Other | Admitting: Physician Assistant

## 2021-05-27 ENCOUNTER — Telehealth: Payer: Self-pay | Admitting: Family Medicine

## 2021-05-27 NOTE — Telephone Encounter (Signed)
Follow Up:      Patient said she was supposed to be getting help with her Eliquis. She said she have not heard anything. She said she will need a blood thinner called in please.

## 2021-06-02 ENCOUNTER — Other Ambulatory Visit: Payer: Self-pay

## 2021-06-02 ENCOUNTER — Encounter: Payer: Self-pay | Admitting: Cardiology

## 2021-06-02 ENCOUNTER — Ambulatory Visit: Payer: Medicare Other | Admitting: Cardiology

## 2021-06-02 VITALS — BP 192/64 | HR 55 | Ht 65.0 in | Wt 179.6 lb

## 2021-06-02 DIAGNOSIS — I10 Essential (primary) hypertension: Secondary | ICD-10-CM | POA: Diagnosis not present

## 2021-06-02 DIAGNOSIS — I480 Paroxysmal atrial fibrillation: Secondary | ICD-10-CM

## 2021-06-02 DIAGNOSIS — I25119 Atherosclerotic heart disease of native coronary artery with unspecified angina pectoris: Secondary | ICD-10-CM

## 2021-06-02 DIAGNOSIS — N186 End stage renal disease: Secondary | ICD-10-CM

## 2021-06-02 MED ORDER — APIXABAN 5 MG PO TABS: 5.0000 mg | ORAL_TABLET | Freq: Two times a day (BID) | ORAL | 0 refills | Status: DC

## 2021-06-02 MED ORDER — HYDRALAZINE HCL 50 MG PO TABS: 50.0000 mg | ORAL_TABLET | Freq: Two times a day (BID) | ORAL | 1 refills | Status: DC

## 2021-06-02 NOTE — Telephone Encounter (Signed)
Discussed with patient during office visit today Says eliquis cost $100 and she could not afford this and also says she is in the donut hole Gave eliquis samples and advised to contact our office in January 2023 if her copay is still unaffordable so that patient assistance could be done. Verbalized understanding of plan

## 2021-06-02 NOTE — Progress Notes (Signed)
Cardiology Office Note  Date: 06/02/2021   ID: Joyce [name], DOB 1940/11/11, MRN [id number]  PCP:  Allie Dimmer, MD  Cardiologist:  Rozann Lesches, MD Electrophysiologist:  None   Chief Complaint  Patient presents with   Cardiac follow-up     History of Present Illness: Joyce [name] is a 80 y.o. female last seen in November by Mr. Leonides Sake NP.  I reviewed the recent note, she was noted to be in atrial fibrillation as of October, CHA2DS2-VASc score of 6.  She continues to undergo regular hemodialysis, ultimately with plan to replace previously removed PD catheter.  She presents today for follow-up.  Blood pressure has been significantly elevated in the last few weeks.  Recordings in hemodialysis this morning were 242/107 and 203/111.  Nursing placed call to patient's pharmacy to clarify medication list.  At this point she is on bisoprolol 10 mg daily, Cardizem CD 240 mg daily, Cozaar 50 mg daily, Imdur 60 mg daily, Aldactone 12.5 mg daily, and just recently started on nifedipine by nephrologist.  Her heart rate is in the 50s and she remains hypertensive.  She was taken off Plavix at the last visit with initiation of Eliquis for stroke prophylaxis.  I reviewed her most recent lab work as described below.  Past Medical History:  Diagnosis Date   Anemia    Arthritis    Asthma    Atrial fibrillation [facility])    Diagnosed October 2022   Bilateral pleural effusion    Postoperative, status post Pleurx catheter and talc treatment - Dr. Prescott Gum   CAD (coronary artery disease)    Multivessel status post CABG 05/2014 -  LIMA to LAD, SVG to diagonal, SVG to OM, SVG to PDA   Chronic kidney disease (CKD), stage III (moderate) (HCC)    Chronic lower back pain    COPD (chronic obstructive pulmonary disease) ([place])    Essential hypertension    Fibromyalgia    History of pneumonia    Hyperlipidemia    Myocardial infarction ([place])    Type II diabetes mellitus ([place])     Past  Surgical History:  Procedure Laterality Date   ABDOMINAL HYSTERECTOMY  1980's   ANTERIOR CERVICAL DECOMP/DISCECTOMY FUSION  2000's   APPENDECTOMY  1970's   BACK SURGERY     BIOPSY  08/28/2019   Procedure: BIOPSY;  Surgeon: Rogene Houston, MD;  [facility];  Service: Endoscopy;;  duodenal   CHEST TUBE INSERTION Left 08/07/2014   Procedure: INSERTION PLEURAL DRAINAGE CATHETER;  Surgeon: Ivin Poot, MD;  Location: [place];  Service: Thoracic;  Laterality: Left;   CHEST TUBE INSERTION Right 08/12/2014   Procedure: INSERTION PLEURAL DRAINAGE CATHETER;  Surgeon: Ivin Poot, MD;  Location: [place];  Service: Thoracic;  Laterality: Right;   CHOLECYSTECTOMY  ~ 2005   COLONOSCOPY N/A 08/28/2019   Procedure: COLONOSCOPY;  Surgeon: Rogene Houston, MD;  [facility];  Service: Endoscopy;  Laterality: N/A;  100   CORONARY ARTERY BYPASS GRAFT N/A 06/15/2014   Procedure: CORONARY ARTERY BYPASS GRAFTING (CABG);  Surgeon: Ivin Poot, MD;  Location: [place];  Service: Open Heart Surgery;  Laterality: N/A;   CORONARY ATHERECTOMY N/A 05/22/2018   Procedure: CORONARY ATHERECTOMY;  Surgeon: Belva Crome, MD;  Location: [place] CV LAB;  Service: Cardiovascular;  Laterality: N/A;   CORONARY STENT INTERVENTION N/A 05/22/2018   Procedure: CORONARY STENT INTERVENTION;  Surgeon: Belva Crome, MD;  Location: [place]  CV LAB;  Service: Cardiovascular;  Laterality: N/A;  rca    ESOPHAGOGASTRODUODENOSCOPY N/A 08/28/2019   Procedure: ESOPHAGOGASTRODUODENOSCOPY (EGD);  Surgeon: Rogene Houston, MD;  [facility];  Service: Endoscopy;  Laterality: N/A;   INTRAOPERATIVE TRANSESOPHAGEAL ECHOCARDIOGRAM N/A 06/15/2014   Procedure: INTRAOPERATIVE TRANSESOPHAGEAL ECHOCARDIOGRAM;  Surgeon: Ivin Poot, MD;  Location: [place];  Service: Open Heart Surgery;  Laterality: N/A;   LEFT HEART CATH AND CORS/GRAFTS ANGIOGRAPHY N/A 05/20/2018   Procedure: LEFT HEART CATH AND CORS/GRAFTS  ANGIOGRAPHY;  Surgeon: Lorretta Harp, MD;  Location: [place] CV LAB;  Service: Cardiovascular;  Laterality: N/A;   LEFT HEART CATHETERIZATION WITH CORONARY ANGIOGRAM N/A 06/11/2014   Procedure: LEFT HEART CATHETERIZATION WITH CORONARY ANGIOGRAM;  Surgeon: Blane Ohara, MD;  Location: [facility] CATH LAB;  Service: Cardiovascular;  Laterality: N/A;   LUMBAR DISC SURGERY  1980's X 1; 1990's X  2000's X 1   POLYPECTOMY  08/28/2019   Procedure: POLYPECTOMY;  Surgeon: Rogene Houston, MD;  [facility];  Service: Endoscopy;;   REMOVAL OF PLEURAL DRAINAGE CATHETER Bilateral 02/11/2015   Procedure: REMOVAL OF PLEURAL DRAINAGE CATHETER;  Surgeon: Ivin Poot, MD;  Location: [place];  Service: Thoracic;  Laterality: Bilateral;   TALC PLEURODESIS Bilateral 02/11/2015   Procedure: Pietro Cassis;  Surgeon: Ivin Poot, MD;  Location: [place];  Service: Thoracic;  Laterality: Bilateral;   TONSILLECTOMY  ~ [place] Right 02/02/2020   Procedure: RIGHT TOTAL HIP ARTHROPLASTY ANTERIOR APPROACH;  Surgeon: Frederik Pear, MD;  Location: [facility];  Service: Orthopedics;  Laterality: Right;    Current Outpatient Medications  Medication Sig Dispense Refill   albuterol (VENTOLIN HFA) 108 (90 Base) MCG/ACT inhaler Inhale 2 puffs into the lungs every 4 (four) hours as needed for wheezing or shortness of breath. 8 g 5   atorvastatin (LIPITOR) 40 MG tablet Take 40 mg by mouth every evening.     bisoprolol (ZEBETA) 10 MG tablet Take 10 mg by mouth daily.     bumetanide (BUMEX) 2 MG tablet Take 4 mg by mouth 2 (two) times daily.     calcitRIOL (ROCALTROL) 0.25 MCG capsule Take 0.25 mcg by mouth daily.     diltiazem (CARDIZEM CD) 240 MG 24 hr capsule Take 240 mg by mouth daily.     fluticasone (FLONASE) 50 MCG/ACT nasal spray Place 1 spray into both nostrils daily.      hydrALAZINE (APRESOLINE) 50 MG tablet Take 1 tablet (50 mg total) by mouth 2 (two) times daily. 180 tablet 1   insulin  glargine (LANTUS) 100 UNIT/ML injection Inject 0.3 mLs (30 Units total) into the skin at bedtime. (Patient taking differently: Inject 20 Units into the skin at bedtime.) 10 mL 11   insulin lispro (HUMALOG) 100 UNIT/ML KiwkPen Inject 8 Units into the skin 3 (three) times daily with meals.     isosorbide mononitrate (IMDUR) 60 MG 24 hr tablet Take 60 mg by mouth daily.     lactulose (CHRONULAC) 10 GM/15ML solution Take 30 g by mouth daily as needed for mild constipation.     lidocaine (XYLOCAINE) 2 % solution Use as directed 10 mLs in the mouth or throat every 4 (four) hours as needed for mouth pain.     losartan (COZAAR) 50 MG tablet Take 50 mg by mouth daily.     nitroGLYCERIN (NITROSTAT) 0.4 MG SL tablet Place 1 tablet (0.4 mg total) under the tongue every 5 (five) minutes  x 3 doses as needed for chest pain. 25 tablet 3   ondansetron (ZOFRAN) 4 MG tablet Take 4 mg by mouth every 8 (eight) hours as needed for nausea or vomiting.     predniSONE (DELTASONE) 20 MG tablet 3 po daily for 2 days, then 2 po daily for 2 days, then 1 po daily for 2 days     primidone (MYSOLINE) 50 MG tablet Take 100 mg by mouth daily.   5   sodium bicarbonate 650 MG tablet Take 650 mg by mouth 2 (two) times daily.     spironolactone (ALDACTONE) 25 MG tablet Take 12.5 mg by mouth daily.     STIOLTO RESPIMAT 2.5-2.5 MCG/ACT AERS INHALE 2 PUFFS BY MOUTH DAILY (Patient taking differently: Take 2 puffs by mouth daily at 6 (six) AM.) 4 g 6   traMADol (ULTRAM) 50 MG tablet Take 100 mg by mouth every 8 (eight) hours as needed for moderate pain.      valACYclovir (VALTREX) 1000 MG tablet Take 1,000 mg by mouth 2 (two) times daily.     albuterol (PROVENTIL) (2.5 MG/3ML) 0.083% nebulizer solution Take 3 mLs (2.5 mg total) by nebulization every 6 (six) hours as needed for wheezing or shortness of breath. 75 mL 12   apixaban (ELIQUIS) 5 MG TABS tablet Take 1 tablet (5 mg total) by mouth 2 (two) times daily. 70 tablet 0   busPIRone  (BUSPAR) 5 MG tablet Take 5 mg by mouth daily as needed (anxiety).     Cholecalciferol (VITAMIN D3) 2000 units TABS Take 4,000-6,000 Units by mouth See admin instructions. Take 4000 units by mouth every other day alternating with 6000 units on alternate days     HYDROcodone-acetaminophen (NORCO/VICODIN) 5-325 MG tablet Take 1 tablet by mouth every 6 (six) hours as needed.     Multiple Vitamin (MULTIVITAMIN WITH MINERALS) TABS tablet Take 1 tablet by mouth daily.     naloxone (NARCAN) nasal spray 4 mg/0.1 mL Place 1 spray into the nose daily as needed (accidental overdose.). Use as directed.     No current facility-administered medications for this visit.   Allergies:  Carvedilol, Codeine, Linaclotide, and Metoprolol   ROS: No sense of palpitations or chest pain.  Physical Exam: VS:  BP (!) 192/64   Pulse (!) 55   Ht 5\' 5"  (1.651 m)   Wt 179 lb 9.6 oz (81.5 kg)   SpO2 95%   BMI 29.89 kg/m , BMI Body mass index is 29.89 kg/m.  Wt Readings from Last 3 Encounters:  06/02/21 179 lb 9.6 oz (81.5 kg)  05/02/21 177 lb (80.3 kg)  04/07/21 183 lb 12.8 oz (83.4 kg)    General: Patient appears comfortable at rest. HEENT: Conjunctiva and lids normal, wearing a mask. Neck: Supple, no elevated JVP or carotid bruits, no thyromegaly. Lungs: Clear to auscultation, nonlabored breathing at rest. Cardiac: Regular rate and rhythm, no S3, 2/6 systolic murmur, no pericardial rub. Extremities: No pitting edema.  ECG:  An ECG dated 05/02/2021 was personally reviewed today and demonstrated:  Sinus rhythm.  Recent Labwork: 11/18/2020: ALT 29; AST 25; B Natriuretic Peptide 1,340.4 11/20/2020: Hemoglobin 7.6; Platelets 131 11/23/2020: Magnesium 1.9 11/24/2020: BUN 73; Creatinine, Ser 3.39; Potassium 3.4; Sodium 135  December 2022: Hemoglobin 10.6, platelets 162, potassium 3.1, BUN 23, creatinine 2.89  Other Studies Reviewed Today:  Echocardiogram 04/26/2021 [facility] clinic): Summary   1. Overall left  ventricular ejection fraction is estimated at 60 to 65%.   2. Normal global left ventricular  systolic function.   3. Moderate concentric left ventricular hypertrophy.   4. Normal left ventricular diastolic filling.   5. Biplane EF 66%. 3D LVEF 62%.   6. TAPSE 22 mm c/w NL RVF.   7. There is no evidence of pericardial effusion.   8. Mild aortic stenosis.   9. There is moderate aortic valve sclerosis.  10. No intracardiac thrombi, mass or vegetations.    Assessment and Plan:  1.  Paroxysmal atrial fibrillation with CHA2DS2-VASc score of 6, rhythm documented in October.  She is asymptomatic at this time with plan to continue Eliquis.  She is on both bisoprolol and Cardizem CD and heart rate is in the 50s today in sinus rhythm.  2.  Essential hypertension, poorly controlled with worsening blood pressure in the last few weeks.  Would discontinue nifedipine since she is already on Cardizem CD and bisoprolol.  Continue losartan and Aldactone.  Starting hydralazine 50 mg twice daily.  We will arrange blood pressure check in a few weeks.  3.  ESRD currently on hemodialysis with plan to have new PD catheter placed in January and go back to peritoneal dialysis.  4.  Mixed hyperlipidemia, continues on Lipitor.  5.  CAD status post CABG in 2015 with subsequently documented graft disease and stent intervention to the RCA in 2009.  No active angina at this time on medical therapy.  Medication Adjustments/Labs and Tests Ordered: Current medicines are reviewed at length with the patient today.  Concerns regarding medicines are outlined above.   Tests Ordered: No orders of the defined types were placed in this encounter.   Medication Changes: Meds ordered this encounter  Medications   hydrALAZINE (APRESOLINE) 50 MG tablet    Sig: Take 1 tablet (50 mg total) by mouth 2 (two) times daily.    Dispense:  180 tablet    Refill:  1    06/01/2021 STOP NIFEDIPINE   apixaban (ELIQUIS) 5 MG TABS tablet     Sig: Take 1 tablet (5 mg total) by mouth 2 (two) times daily.    Dispense:  70 tablet    Refill:  0    Lot #[id number] Exp 04/2023     Disposition:  Follow up  2 months.  Signed, Satira Sark, MD, [facility] 06/02/2021 12:01 PM    [address], [place], [place] [address] Phone: [phone]; Fax: [phone]

## 2021-06-02 NOTE — Patient Instructions (Addendum)
Medication Instructions:  Your physician has recommended you make the following change in your medication:  Stop nifedipine Start hydralazine 50 mg twice daily Continue other medications the same  Labwork: none  Testing/Procedures: none  Follow-Up: Your physician recommends that you schedule a follow-up appointment in: 2 months Return in 2 weeks for a nurse visit to have your blood pressure rechecked  Any Other Special Instructions Will Be Listed Below (If Applicable).  If you need a refill on your cardiac medications before your next appointment, please call your pharmacy.

## 2021-06-15 ENCOUNTER — Ambulatory Visit: Payer: Medicare Other

## 2021-07-01 ENCOUNTER — Telehealth: Payer: Self-pay | Admitting: Cardiology

## 2021-07-01 NOTE — Telephone Encounter (Signed)
°  Pt requesting to speak with RN Alma Friendly, she said she needs to discuss with her about her meds

## 2021-07-01 NOTE — Telephone Encounter (Signed)
Called to reschedule nurse visit visit that was missed last month Appointment rescheduled.

## 2021-07-04 ENCOUNTER — Ambulatory Visit: Payer: Medicare Other

## 2021-07-11 ENCOUNTER — Other Ambulatory Visit: Payer: Self-pay | Admitting: Emergency Medicine

## 2021-07-18 ENCOUNTER — Telehealth: Payer: Self-pay | Admitting: Cardiology

## 2021-07-18 NOTE — Telephone Encounter (Signed)
Patient called and wanted to speak to Nurse Alma Friendly. The patient did not give any other information. She only requested Alma Friendly to call her.

## 2021-07-19 NOTE — Telephone Encounter (Signed)
Called to reschedule nurse visit to have BP checked. Says she missed her last appointment due to having covid. Says her blood pressures have been normal with recent BP med changes since last visit. Reports BP is normal at dialysis and sometimes too low. Says her home BP readings are always around 140/70 Reports she has been taking hydralazine 25 mg three times daily. Advised that directions we have are twice daily.  Advised to continue taking meds the same, and cancel BP nurse visit, keeping OV with SM on 08/10/21. Advised to bring home BP readings to visit. Verbalized understanding.

## 2021-07-22 ENCOUNTER — Telehealth: Payer: Self-pay | Admitting: Cardiology

## 2021-07-22 NOTE — Telephone Encounter (Signed)
Advised that co-pay is $47. Says she can handle this co-pay. Advised that we will cancel eliquis sample request and if she needed assistance in the future to give [place] a call. Verbalized understanding

## 2021-07-22 NOTE — Telephone Encounter (Signed)
Patient calling the office for samples of medication:   1.  What medication and dosage are you requesting samples for? apixaban (ELIQUIS) 5 MG TABS tablet  2.  Are you currently out of this medication? Yes    

## 2021-07-22 NOTE — Telephone Encounter (Signed)
Says eliquis co-pay is $100. [facility] contacted and says eliquis last picked up 04/2021- co-pay was $0.  Co-pay now is $47 for eliquis. Advised that eliquis samples are available for pick up on Monday, 07/25/21 along with BMS-PAF that she is to fill out all patient sections and bring back to office with proof of income and prescription out of pocket expense for 2023. Verbalized understanding.

## 2021-08-10 ENCOUNTER — Ambulatory Visit: Payer: Medicare Other | Admitting: Cardiology

## 2021-08-10 ENCOUNTER — Encounter: Payer: Self-pay | Admitting: Cardiology

## 2021-08-10 VITALS — BP 158/62 | HR 53 | Ht 65.0 in | Wt 187.4 lb

## 2021-08-10 DIAGNOSIS — I10 Essential (primary) hypertension: Secondary | ICD-10-CM

## 2021-08-10 DIAGNOSIS — I480 Paroxysmal atrial fibrillation: Secondary | ICD-10-CM

## 2021-08-10 DIAGNOSIS — I350 Nonrheumatic aortic (valve) stenosis: Secondary | ICD-10-CM

## 2021-08-10 DIAGNOSIS — I25119 Atherosclerotic heart disease of native coronary artery with unspecified angina pectoris: Secondary | ICD-10-CM

## 2021-08-10 NOTE — Progress Notes (Signed)
Cardiology Office Note  Date: 08/10/2021   ID: Joyce [name], DOB [birth year], MRN [id number]  PCP:  Allie Dimmer, MD  Cardiologist:  Rozann Lesches, MD Electrophysiologist:  None   Chief Complaint  Patient presents with   Cardiac follow-up    History of Present Illness: Joyce [name] is a 81 y.o. female last seen in December 2022.  She is here for a routine visit.  States that she has been doing well with hemodialysis.  Plans to undergo vascular mapping in anticipation of placement of an AV fistula, although eventually we will also try and resume peritoneal dialysis.  She states her blood pressure has been better controlled following medication adjustments.  At the last visit she was taken off nifedipine and started on hydralazine.  The remainder of her regimen is outlined below, also includes losartan.  She does not report any palpitations, heart rate is regular today.  She remains on Eliquis for stroke prophylaxis.  Past Medical History:  Diagnosis Date   Anemia    Arthritis    Asthma    Atrial fibrillation [facility])    Diagnosed October 2022   Bilateral pleural effusion    Postoperative, status post Pleurx catheter and talc treatment - Dr. Prescott Gum   CAD (coronary artery disease)    Multivessel status post CABG 05/2014 -  LIMA to LAD, SVG to diagonal, SVG to OM, SVG to PDA   Chronic lower back pain    COPD (chronic obstructive pulmonary disease) ([place])    ESRD (end stage renal disease) ([place])    Essential hypertension    Fibromyalgia    History of pneumonia    Hyperlipidemia    Myocardial infarction ([place])    Type II diabetes mellitus ([place])     Past Surgical History:  Procedure Laterality Date   ABDOMINAL HYSTERECTOMY  1980's   ANTERIOR CERVICAL DECOMP/DISCECTOMY FUSION  2000's   APPENDECTOMY  1970's   BACK SURGERY     BIOPSY  08/28/2019   Procedure: BIOPSY;  Surgeon: Rogene Houston, MD;  [facility];  Service: Endoscopy;;  duodenal   CHEST  TUBE INSERTION Left 08/07/2014   Procedure: INSERTION PLEURAL DRAINAGE CATHETER;  Surgeon: Ivin Poot, MD;  Location: [place];  Service: Thoracic;  Laterality: Left;   CHEST TUBE INSERTION Right 08/12/2014   Procedure: INSERTION PLEURAL DRAINAGE CATHETER;  Surgeon: Ivin Poot, MD;  Location: [place];  Service: Thoracic;  Laterality: Right;   CHOLECYSTECTOMY  ~ 2005   COLONOSCOPY N/A 08/28/2019   Procedure: COLONOSCOPY;  Surgeon: Rogene Houston, MD;  [facility];  Service: Endoscopy;  Laterality: N/A;  100   CORONARY ARTERY BYPASS GRAFT N/A 06/15/2014   Procedure: CORONARY ARTERY BYPASS GRAFTING (CABG);  Surgeon: Ivin Poot, MD;  Location: [place];  Service: Open Heart Surgery;  Laterality: N/A;   CORONARY ATHERECTOMY N/A 05/22/2018   Procedure: CORONARY ATHERECTOMY;  Surgeon: Belva Crome, MD;  Location: [place] [facility];  Service: Cardiovascular;  Laterality: N/A;   CORONARY STENT INTERVENTION N/A 05/22/2018   Procedure: CORONARY STENT INTERVENTION;  Surgeon: Belva Crome, MD;  Location: [place] [facility];  Service: Cardiovascular;  Laterality: N/A;  rca    ESOPHAGOGASTRODUODENOSCOPY N/A 08/28/2019   Procedure: ESOPHAGOGASTRODUODENOSCOPY (EGD);  Surgeon: Rogene Houston, MD;  [facility];  Service: Endoscopy;  Laterality: N/A;   INTRAOPERATIVE TRANSESOPHAGEAL ECHOCARDIOGRAM N/A 06/15/2014   Procedure: INTRAOPERATIVE TRANSESOPHAGEAL ECHOCARDIOGRAM;  Surgeon: Ivin Poot, MD;  [facility];  Service: Open Heart Surgery;  Laterality: N/A;   LEFT HEART CATH AND CORS/GRAFTS ANGIOGRAPHY N/A 05/20/2018   Procedure: LEFT HEART CATH AND CORS/GRAFTS ANGIOGRAPHY;  Surgeon: Lorretta Harp, MD;  Location: [place] [facility];  Service: Cardiovascular;  Laterality: N/A;   LEFT HEART CATHETERIZATION WITH CORONARY ANGIOGRAM N/A 06/11/2014   Procedure: LEFT HEART CATHETERIZATION WITH CORONARY ANGIOGRAM;  Surgeon: Blane Ohara, MD;  [facility];   Service: Cardiovascular;  Laterality: N/A;   LUMBAR DISC SURGERY  1980's X 1; 1990's X  2000's X 1   POLYPECTOMY  08/28/2019   Procedure: POLYPECTOMY;  Surgeon: Rogene Houston, MD;  [facility];  Service: Endoscopy;;   REMOVAL OF PLEURAL DRAINAGE CATHETER Bilateral 02/11/2015   Procedure: REMOVAL OF PLEURAL DRAINAGE CATHETER;  Surgeon: Ivin Poot, MD;  Location: [place];  Service: Thoracic;  Laterality: Bilateral;   TALC PLEURODESIS Bilateral 02/11/2015   Procedure: Pietro Cassis;  Surgeon: Ivin Poot, MD;  Location: [place];  Service: Thoracic;  Laterality: Bilateral;   TONSILLECTOMY  ~ [place] Right 02/02/2020   Procedure: RIGHT TOTAL HIP ARTHROPLASTY ANTERIOR APPROACH;  Surgeon: Frederik Pear, MD;  Location: [facility];  Service: Orthopedics;  Laterality: Right;    Current Outpatient Medications  Medication Sig Dispense Refill   albuterol (PROVENTIL) (2.5 MG/3ML) 0.083% nebulizer solution Take 3 mLs (2.5 mg total) by nebulization every 6 (six) hours as needed for wheezing or shortness of breath. 75 mL 12   albuterol (VENTOLIN HFA) 108 (90 Base) MCG/ACT inhaler Inhale 2 puffs into the lungs every 4 (four) hours as needed for wheezing or shortness of breath. 8 g 5   apixaban (ELIQUIS) 5 MG TABS tablet Take 1 tablet (5 mg total) by mouth 2 (two) times daily. 70 tablet 0   atorvastatin (LIPITOR) 40 MG tablet Take 40 mg by mouth every evening.     bisoprolol (ZEBETA) 10 MG tablet Take 10 mg by mouth daily.     bumetanide (BUMEX) 2 MG tablet Take 4 mg by mouth 2 (two) times daily.     busPIRone (BUSPAR) 5 MG tablet Take 5 mg by mouth daily as needed (anxiety).     calcitRIOL (ROCALTROL) 0.25 MCG capsule Take 0.25 mcg by mouth daily.     Cholecalciferol (VITAMIN D3) 2000 units TABS Take 4,000-6,000 Units by mouth See admin instructions. Take 4000 units by mouth every other day alternating with 6000 units on alternate days     diltiazem (CARDIZEM CD) 240 MG 24 hr  capsule Take 240 mg by mouth daily.     fluticasone (FLONASE) 50 MCG/ACT nasal spray Place 1 spray into both nostrils daily.      hydrALAZINE (APRESOLINE) 50 MG tablet Take 1 tablet (50 mg total) by mouth 2 (two) times daily. 180 tablet 1   HYDROcodone-acetaminophen (NORCO/VICODIN) 5-325 MG tablet Take 1 tablet by mouth every 6 (six) hours as needed.     insulin glargine (LANTUS) 100 UNIT/ML injection Inject 0.3 mLs (30 Units total) into the skin at bedtime. (Patient taking differently: Inject 20 Units into the skin at bedtime.) 10 mL 11   insulin lispro (HUMALOG) 100 UNIT/ML KiwkPen Inject 8 Units into the skin 3 (three) times daily with meals.     isosorbide mononitrate (IMDUR) 60 MG 24 hr tablet Take 60 mg by mouth daily.     lactulose (CHRONULAC) 10 GM/15ML solution Take 30 g by mouth daily as needed for  mild constipation.     lidocaine (XYLOCAINE) 2 % solution Use as directed 10 mLs in the mouth or throat every 4 (four) hours as needed for mouth pain.     losartan (COZAAR) 100 MG tablet Take 100 mg by mouth daily.     Multiple Vitamin (MULTIVITAMIN WITH MINERALS) TABS tablet Take 1 tablet by mouth daily.     naloxone (NARCAN) nasal spray 4 mg/0.1 mL Place 1 spray into the nose daily as needed (accidental overdose.). Use as directed.     nitroGLYCERIN (NITROSTAT) 0.4 MG SL tablet Place 1 tablet (0.4 mg total) under the tongue every 5 (five) minutes x 3 doses as needed for chest pain. 25 tablet 3   ondansetron (ZOFRAN) 4 MG tablet Take 4 mg by mouth every 8 (eight) hours as needed for nausea or vomiting.     primidone (MYSOLINE) 50 MG tablet Take 100 mg by mouth daily.   5   sodium bicarbonate 650 MG tablet Take 650 mg by mouth 2 (two) times daily.     spironolactone (ALDACTONE) 25 MG tablet Take 12.5 mg by mouth daily.     STIOLTO RESPIMAT 2.5-2.5 MCG/ACT AERS INHALE 2 PUFFS BY MOUTH DAILY 4 g 6   traMADol (ULTRAM) 50 MG tablet Take 100 mg by mouth every 8 (eight) hours as needed for moderate  pain.      valACYclovir (VALTREX) 1000 MG tablet Take 1,000 mg by mouth 2 (two) times daily.     No current facility-administered medications for this visit.   Allergies:  Carvedilol, Codeine, Linaclotide, and Metoprolol   ROS: No chest pain.  No syncope.  Physical Exam: VS:  BP (!) 158/62    Pulse (!) 53    Ht 5\' 5"  (1.651 m)    Wt 187 lb 6.4 oz (85 kg)    SpO2 96%    BMI 31.18 kg/m , BMI Body mass index is 31.18 kg/m.  Wt Readings from Last 3 Encounters:  08/10/21 187 lb 6.4 oz (85 kg)  06/02/21 179 lb 9.6 oz (81.5 kg)  05/02/21 177 lb (80.3 kg)    General: Patient appears comfortable at rest. HEENT: Conjunctiva and lids normal, wearing a mask. Neck: Supple, no elevated JVP or carotid bruits, no thyromegaly. Lungs: Clear to auscultation, nonlabored breathing at rest. Cardiac: Regular rate and rhythm, no S3, 2/6 systolic murmur, no pericardial rub. Extremities: No pitting edema.  ECG:  An ECG dated 05/02/2021 was personally reviewed today and demonstrated:  Sinus rhythm.  Recent Labwork: 11/18/2020: ALT 29; AST 25; B Natriuretic Peptide 1,340.4 11/20/2020: Hemoglobin 7.6; Platelets 131 11/23/2020: Magnesium 1.9 11/24/2020: BUN 73; Creatinine, Ser 3.39; Potassium 3.4; Sodium 135     Component Value Date/Time   CHOL 111 05/18/2018 0418   TRIG 116 05/18/2018 0418   HDL 41 05/18/2018 0418   CHOLHDL 2.7 05/18/2018 0418   VLDL 23 05/18/2018 0418   LDLCALC 47 05/18/2018 0418    Other Studies Reviewed Today:  Echocardiogram 04/26/2021 [facility] clinic): Summary   1. Overall left ventricular ejection fraction is estimated at 60 to 65%.   2. Normal global left ventricular systolic function.   3. Moderate concentric left ventricular hypertrophy.   4. Normal left ventricular diastolic filling.   5. Biplane EF 66%. 3D LVEF 62%.   6. TAPSE 22 mm c/w NL RVF.   7. There is no evidence of pericardial effusion.   8. Mild aortic stenosis.   9. There is moderate aortic valve sclerosis.   10. No  intracardiac thrombi, mass or vegetations.    Assessment and Plan:  1.  CAD status post CABG in 2015 with graft disease and subsequent stent intervention to the RCA in 2019.  She does not report any active angina at this time.  Continue bisoprolol, Imdur, and Lipitor.  2.  Paroxysmal atrial fibrillation with CHA2DS2-VASc score of 6.  She reports no palpitations and has regular heart rate today on examination.  Continue bisoprolol and Cardizem CD.  Also on Eliquis for stroke prophylaxis.  She has regular lab work obtained through nephrology and hemodialysis.  3.  Essential hypertension.  Blood pressure trend is better following medication adjustments.  No change in regimen outlined above.  She is following regularly with nephrology.  4.  ESRD currently on hemodialysis.  5.  Mild aortic stenosis.  Medication Adjustments/Labs and Tests Ordered: Current medicines are reviewed at length with the patient today.  Concerns regarding medicines are outlined above.   Tests Ordered: No orders of the defined types were placed in this encounter.   Medication Changes: No orders of the defined types were placed in this encounter.   Disposition:  Follow up  6 months.  Signed, Satira Sark, MD, [facility] 08/10/2021 4:14 PM    [facility] [place], [place],  [address] Phone: [phone]; Fax: [phone]

## 2021-08-10 NOTE — Patient Instructions (Addendum)

## 2021-09-26 ENCOUNTER — Telehealth: Payer: Self-pay | Admitting: Cardiology

## 2021-09-26 NOTE — Telephone Encounter (Signed)
Pt c/o medication issue: ? ?  ?1. Name of Medication: apixaban (ELIQUIS) 5 MG TABS tablet [[id number] ? ?2. How are you currently taking this medication (dosage and times per day)? 5 Mg  ? ?3. Are you having a reaction (difficulty breathing--STAT)? No  ? ?4. What is your medication issue? Pt stated the price went up on this med and she stated she has been getting help with this med   ?  ?

## 2021-09-27 MED ORDER — APIXABAN 5 MG PO TABS: 5.0000 mg | ORAL_TABLET | Freq: Two times a day (BID) | ORAL | 0 refills | Status: DC

## 2021-09-27 NOTE — Telephone Encounter (Signed)
[facility] contacted to get eliquis copay and on 08/18/21 $47, today co-pay is $173.79 due to being in donut hole.  ? ?Contacted patient and advised that eliquis samples are available for pick up along with BMS-PAF that she is to fill out all patient sections and bring back to office with proof of income and prescription out of pocket expense for 2023 Verbalized understanding.  ?

## 2021-10-07 ENCOUNTER — Other Ambulatory Visit: Payer: Self-pay | Admitting: Cardiology

## 2021-10-20 ENCOUNTER — Other Ambulatory Visit: Payer: Self-pay

## 2021-10-20 MED ORDER — BISOPROLOL FUMARATE 10 MG PO TABS: 10.0000 mg | ORAL_TABLET | Freq: Every day | ORAL | 3 refills | Status: AC

## 2021-11-23 ENCOUNTER — Other Ambulatory Visit: Payer: Self-pay

## 2021-11-23 ENCOUNTER — Telehealth: Payer: Self-pay | Admitting: Cardiology

## 2021-11-23 MED ORDER — HYDRALAZINE HCL 50 MG PO TABS: 50.0000 mg | ORAL_TABLET | Freq: Two times a day (BID) | ORAL | 1 refills | Status: DC

## 2021-11-23 NOTE — Telephone Encounter (Signed)
   Pt is requesting for RN Alma Friendly to call her back today

## 2021-11-23 NOTE — Telephone Encounter (Signed)
Requesting eliquis samples. Advised samples are not available at this time Verbalized understanding

## 2021-11-24 ENCOUNTER — Telehealth: Payer: Self-pay | Admitting: Cardiology

## 2021-11-24 MED ORDER — NITROGLYCERIN 0.4 MG SL SUBL: 0.4000 mg | SUBLINGUAL_TABLET | SUBLINGUAL | 3 refills | Status: AC | PRN

## 2021-11-24 NOTE — Telephone Encounter (Signed)
*  STAT* If patient is at the pharmacy, call can be transferred to refill team.   1. Which medications need to be refilled? (please list name of each medication and dose if known)  nitroGLYCERIN (NITROSTAT) 0.4 MG SL tablet  2. Which pharmacy/location (including street and city if local pharmacy) is medication to be sent to? [facility] [phone] - [place] [address]  3. Do they need a 30 day or 90 day supply? 30 with refills

## 2021-12-08 ENCOUNTER — Telehealth: Payer: Self-pay | Admitting: Emergency Medicine

## 2021-12-08 NOTE — Telephone Encounter (Signed)
Called and spoke with patient. She wanted to know if she could take her albuterol more than once a day. I advised her that she could use the albuterol nebs every 6 hours as needed. She verbalized understanding.   Nothing further needed at time of call.

## 2021-12-14 ENCOUNTER — Telehealth: Payer: Self-pay | Admitting: Nurse Practitioner

## 2021-12-14 ENCOUNTER — Ambulatory Visit (INDEPENDENT_AMBULATORY_CARE_PROVIDER_SITE_OTHER): Payer: Medicare Other

## 2021-12-14 ENCOUNTER — Encounter: Payer: Self-pay | Admitting: Nurse Practitioner

## 2021-12-14 ENCOUNTER — Ambulatory Visit: Payer: Medicare Other | Admitting: Nurse Practitioner

## 2021-12-14 VITALS — BP 104/58 | HR 57 | Temp 98.7°F | Ht 65.0 in | Wt 194.8 lb

## 2021-12-14 DIAGNOSIS — J45901 Unspecified asthma with (acute) exacerbation: Secondary | ICD-10-CM

## 2021-12-14 DIAGNOSIS — J449 Chronic obstructive pulmonary disease, unspecified: Secondary | ICD-10-CM | POA: Diagnosis not present

## 2021-12-14 DIAGNOSIS — J441 Chronic obstructive pulmonary disease with (acute) exacerbation: Secondary | ICD-10-CM

## 2021-12-14 DIAGNOSIS — I5032 Chronic diastolic (congestive) heart failure: Secondary | ICD-10-CM

## 2021-12-14 MED ORDER — BENZONATATE 200 MG PO CAPS: 200.0000 mg | ORAL_CAPSULE | Freq: Three times a day (TID) | ORAL | 1 refills | Status: DC | PRN

## 2021-12-14 MED ORDER — BREZTRI AEROSPHERE 160-9-4.8 MCG/ACT IN AERO: 2.0000 | INHALATION_SPRAY | Freq: Two times a day (BID) | RESPIRATORY_TRACT | 0 refills | Status: DC

## 2021-12-14 MED ORDER — PREDNISONE 10 MG PO TABS: ORAL_TABLET | ORAL | 0 refills | Status: DC

## 2021-12-14 NOTE — Assessment & Plan Note (Signed)
Appears compensated and euvolemic on exam. Given her worsening SOB and wheezing with hx of CHF and ESRD, will evaluate CXR for pulmonary edema. She is on bumex and spironolactone at baseline. Follow up with cardiology as scheduled.

## 2021-12-14 NOTE — Assessment & Plan Note (Addendum)
Acute exacerbation with bronchospasm on exam. Will treat with prednisone taper. Check CXR to rule out superimposed infection. Recommended we trial step up to triple therapy - pt recently just filled her Stiolto and asked if she could complete this before making the change. Advised that as long as her breathing is stable, she could finish out [employer] and then switch to [employer]. Given her asthmatic component, believe ICS will benefit her. Continue singulair for trigger prevention. Cough control measures. Close follow up.  Patient Instructions  Continue Stiolto 2 puffs daily until you have finished your current inhaler then switch to Breztri 2 puffs Twice daily. Brush tongue and rinse mouth afterwards. Continue Albuterol inhaler 2 puffs or 3 mL neb every 6 hours as needed for shortness of breath or wheezing. Notify if symptoms persist despite rescue inhaler/neb use. Use nebulizer at least twice a day until symptoms are better Continue singulair 10 mg At bedtime  Continue flonase 2 sprays each nostril daily for allergies/nasal congestion  Prednisone taper. 4 tabs for 2 days, then 3 tabs for 2 days, 2 tabs for 2 days, then 1 tab for 2 days, then stop. Take in AM with food. Mucinex 600 mg Twice daily for chest congestion Delsym 2 tsp Twice daily for cough  Tessalon Perles 1 capsule Three times a day as needed for cough  Chest x ray today  Follow up in 2 weeks with Dr. Lamonte Sakai or Alanson Aly. If symptoms do not improve or worsen, please contact office for sooner follow up or seek emergency care.

## 2021-12-14 NOTE — Progress Notes (Signed)
$'@Patient'a$  ID: Joyce [name], female    DOB: 07-25-1940, 81 y.o.   MRN: [id number]  Chief Complaint  Patient presents with   Follow-up    Follow up. Patient states her lungs have been sore for a week and she has a cough and has been short of breath.     Referring provider: Allie Dimmer, MD  HPI: 81 year old female, never smoker followed for COPD with asthma.  She is a patient of Dr. Agustina Caroli and last seen in office 04/07/2021.  Past medical history significant for CHF, history of NSTEMI, CAD status post CABG, hypertension, allergic rhinitis, fatty liver, ESRD on peritoneal dialysis, DM2, OA, depression, fibromyalgia, obesity.  TEST/EVENTS:  05/30/2017 PFTs: FVC 77, FEV1 83, ratio 80, TLC 75, DLCOcor 64 06/23/2020 Pulmonary perfusion: no perfusion defects noted  04/07/2021: OV with Dr. Lamonte Sakai.  Previously diagnosed with COPD related to fixed obstruction from asthma. Reported doing quite well with no recent exacerbations.  Functional capacity has improved with starting peritoneal dialysis.  Continued on Stiolto and as needed albuterol.  Continued Singulair and Flonase for allergic rhinitis.  DOE likely multifactorial with known COPD and heart disease; did have improvement with volume removal after starting dialysis.  12/14/2021: Today - acute Patient presents today with daughter for increased shortness of breath and dry cough over the past 2-3 weeks. She feels like she has had some chest tightness and soreness with frequent coughing but denies any chest pain. She has noticed an occasional wheeze as well. She denies any hemoptysis, leg swelling, weight gain, fevers, night sweats, recent sick exposures. She continues on Stiolto daily. Doesn't use albuterol often.   Allergies  Allergen Reactions   Carvedilol Other (See Comments)    confusion   Codeine Other (See Comments)    confusion   Linaclotide Nausea And Vomiting   Metoprolol Palpitations    Immunization History  Administered  Date(s) Administered   DT (Pediatric) 01/02/2003   Hepatitis B, ped/adol 01/05/2021   Influenza Split 05/14/1999, 04/09/2000, 05/04/2001, 04/11/2002, 04/10/2003, 04/13/2004, 05/16/2005, 04/16/2006, 04/16/2007, 08/06/2008, 04/14/2009   Influenza Whole 04/08/2010, 04/18/2011, 04/12/2012, 04/28/2013, 04/03/2017   Influenza, High Dose Seasonal PF 04/20/2014, 03/15/2015, 03/14/2016, 04/29/2018, 05/06/2019, 03/22/2020, 05/02/2021   Influenza,inj,Quad PF,6+ Mos 04/03/2017   Influenza-Unspecified 03/26/2014, 05/16/2018, 04/07/2020, 05/02/2021   Moderna SARS-COV2 Booster Vaccination 05/10/2020, 06/07/2020   Moderna Sars-Covid-2 Vaccination 09/10/2019, 10/07/2019   PPD Test 12/13/2020   Pneumococcal Conjugate-13 04/09/2000, 05/13/2008, 03/26/2014, 04/20/2014, 08/04/2015   Pneumococcal Polysaccharide-23 06/17/2018   Pneumococcal-Unspecified 06/17/2018    Past Medical History:  Diagnosis Date   Anemia    Arthritis    Asthma    Atrial fibrillation [facility])    Diagnosed October 2022   Bilateral pleural effusion    Postoperative, status post Pleurx catheter and talc treatment - Dr. Prescott Gum   CAD (coronary artery disease)    Multivessel status post CABG 05/2014 -  LIMA to LAD, SVG to diagonal, SVG to OM, SVG to PDA   Chronic lower back pain    COPD (chronic obstructive pulmonary disease) ([place])    ESRD (end stage renal disease) ([place])    Essential hypertension    Fibromyalgia    History of pneumonia    Hyperlipidemia    Myocardial infarction ([place])    Type II diabetes mellitus ([place])     Tobacco History: Social History   Tobacco Use  Smoking Status Never  Smokeless Tobacco Never   Counseling given: Not Answered   Outpatient Medications Prior to Visit  Medication Sig Dispense  Refill   albuterol (PROVENTIL) (2.5 MG/3ML) 0.083% nebulizer solution Take 3 mLs (2.5 mg total) by nebulization every 6 (six) hours as needed for wheezing or shortness of breath. 75 mL 12   albuterol (VENTOLIN HFA)  108 (90 Base) MCG/ACT inhaler Inhale 2 puffs into the lungs every 4 (four) hours as needed for wheezing or shortness of breath. 8 g 5   apixaban (ELIQUIS) 5 MG TABS tablet Take 1 tablet (5 mg total) by mouth 2 (two) times daily. 70 tablet 0   atorvastatin (LIPITOR) 40 MG tablet Take 40 mg by mouth every evening.     bisoprolol (ZEBETA) 10 MG tablet Take 1 tablet (10 mg total) by mouth daily. 90 tablet 3   bumetanide (BUMEX) 2 MG tablet Take 4 mg by mouth 2 (two) times daily.     busPIRone (BUSPAR) 5 MG tablet Take 5 mg by mouth daily as needed (anxiety).     calcitRIOL (ROCALTROL) 0.25 MCG capsule Take 0.25 mcg by mouth daily.     Cholecalciferol (VITAMIN D3) 2000 units TABS Take 4,000-6,000 Units by mouth See admin instructions. Take 4000 units by mouth every other day alternating with 6000 units on alternate days     diltiazem (CARDIZEM CD) 240 MG 24 hr capsule Take 240 mg by mouth daily.     fluticasone (FLONASE) 50 MCG/ACT nasal spray Place 1 spray into both nostrils daily.      hydrALAZINE (APRESOLINE) 50 MG tablet Take 1 tablet (50 mg total) by mouth 2 (two) times daily. 180 tablet 1   insulin glargine (LANTUS) 100 UNIT/ML injection Inject 0.3 mLs (30 Units total) into the skin at bedtime. (Patient taking differently: Inject 20 Units into the skin at bedtime.) 10 mL 11   insulin lispro (HUMALOG) 100 UNIT/ML KiwkPen Inject 8 Units into the skin 3 (three) times daily with meals.     isosorbide mononitrate (IMDUR) 60 MG 24 hr tablet TAKE ONE TABLET BY MOUTH DAILY 90 tablet 1   lactulose (CHRONULAC) 10 GM/15ML solution Take 30 g by mouth daily as needed for mild constipation.     lidocaine (XYLOCAINE) 2 % solution Use as directed 10 mLs in the mouth or throat every 4 (four) hours as needed for mouth pain.     losartan (COZAAR) 100 MG tablet Take 100 mg by mouth daily.     Multiple Vitamin (MULTIVITAMIN WITH MINERALS) TABS tablet Take 1 tablet by mouth daily.     naloxone (NARCAN) nasal spray 4  mg/0.1 mL Place 1 spray into the nose daily as needed (accidental overdose.). Use as directed.     nitroGLYCERIN (NITROSTAT) 0.4 MG SL tablet Place 1 tablet (0.4 mg total) under the tongue every 5 (five) minutes x 3 doses as needed for chest pain (if no relief after 2nd dose, proceed to ED or call 911). 25 tablet 3   ondansetron (ZOFRAN) 4 MG tablet Take 4 mg by mouth every 8 (eight) hours as needed for nausea or vomiting.     primidone (MYSOLINE) 50 MG tablet Take 100 mg by mouth daily.   5   sodium bicarbonate 650 MG tablet Take 650 mg by mouth 2 (two) times daily.     spironolactone (ALDACTONE) 25 MG tablet Take 12.5 mg by mouth daily.     STIOLTO RESPIMAT 2.5-2.5 MCG/ACT AERS INHALE 2 PUFFS BY MOUTH DAILY 4 g 6   traMADol (ULTRAM) 50 MG tablet Take 100 mg by mouth every 8 (eight) hours as needed for moderate pain.  HYDROcodone-acetaminophen (NORCO/VICODIN) 5-325 MG tablet Take 1 tablet by mouth every 6 (six) hours as needed. (Patient not taking: Reported on 12/14/2021)     valACYclovir (VALTREX) 1000 MG tablet Take 1,000 mg by mouth 2 (two) times daily. (Patient not taking: Reported on 12/14/2021)     No facility-administered medications prior to visit.     Review of Systems:   Constitutional: No weight loss or gain, night sweats, fevers, chills, fatigue, or lassitude. HEENT: No headaches, difficulty swallowing, tooth/dental problems, or sore throat. No sneezing, itching, ear ache, nasal congestion, or post nasal drip CV:  No chest pain, orthopnea, PND, swelling in lower extremities, anasarca, dizziness, palpitations, syncope Resp: +shortness of breath with exertion; dry, hacking cough; occasional wheeze. No excess mucus or change in color of mucus. No hemoptysis. No wheezing.  No chest wall deformity GI:  No heartburn, indigestion, abdominal pain, nausea, vomiting, diarrhea, change in bowel habits, loss of appetite, bloody stools.  Skin: No rash, lesions, ulcerations MSK:  No joint pain  or swelling.  No decreased range of motion.  No back pain. Neuro: No dizziness or lightheadedness.  Psych: No depression or anxiety. Mood stable.     Physical Exam:  BP (!) 104/58 (BP Location: Left Arm, Patient Position: Sitting, Cuff Size: Normal)   Pulse (!) 57   Temp 98.7 F (37.1 C) (Oral)   Ht '5\' 5"'$  (1.651 m)   Wt 194 lb 12.8 oz (88.4 kg)   SpO2 95%   BMI 32.42 kg/m   GEN: Pleasant, interactive, well-appearing; obese; in no acute distress. HEENT:  Normocephalic and atraumatic. PERRLA. Sclera white. Nasal turbinates pink, moist and patent bilaterally. No rhinorrhea present. Oropharynx pink and moist, without exudate or edema. No lesions, ulcerations, or postnasal drip.  NECK:  Supple w/ fair ROM. No JVD present. Normal carotid impulses w/o bruits. Thyroid symmetrical with no goiter or nodules palpated. No lymphadenopathy.   CV: RRR, no m/r/g, no peripheral edema. Pulses intact, +2 bilaterally. No cyanosis, pallor or clubbing. PULMONARY:  Unlabored, regular breathing. Scattered wheezes bilaterally A&P. No accessory muscle use. No dullness to percussion. GI: BS present and normoactive. Soft, non-tender to palpation. No organomegaly or masses detected. No CVA tenderness. MSK: No erythema, warmth or tenderness. Cap refil <2 sec all extrem. No deformities or joint swelling noted.  Neuro: A/Ox3. No focal deficits noted.   Skin: Warm, no lesions or rashe Psych: Normal affect and behavior. Judgement and thought content appropriate.     Lab Results:  CBC    Component Value Date/Time   WBC 3.6 (L) 11/20/2020 0131   RBC 2.59 (L) 11/20/2020 0131   HGB 7.6 (L) 11/20/2020 0131   HCT 23.9 (L) 11/20/2020 0131   PLT 131 (L) 11/20/2020 0131   MCV 92.3 11/20/2020 0131   MCH 29.3 11/20/2020 0131   MCHC 31.8 11/20/2020 0131   RDW 13.2 11/20/2020 0131   LYMPHSABS 0.8 11/18/2020 1226   MONOABS 0.3 11/18/2020 1226   EOSABS 0.1 11/18/2020 1226   BASOSABS 0.0 11/18/2020 1226    BMET     Component Value Date/Time   NA 135 11/24/2020 0239   K 3.4 (L) 11/24/2020 0239   CL 98 11/24/2020 0239   CO2 27 11/24/2020 0239   GLUCOSE 160 (H) 11/24/2020 0239   BUN 73 (H) 11/24/2020 0239   CREATININE 3.39 (H) 11/24/2020 0239   CALCIUM 8.6 (L) 11/24/2020 0239   GFRNONAA 13 (L) 11/24/2020 0239   GFRAA 22 (L) 02/03/2020 0325    BNP  Component Value Date/Time   BNP 1,340.4 (H) 11/18/2020 1227     Imaging:  DG Chest 2 View  Result Date: 12/14/2021 CLINICAL DATA:  Cough with increasing shortness of breath. EXAM: CHEST - 2 VIEW COMPARISON:  Radiograph Nov 18, 2020 FINDINGS: Right approach dual lumen central venous catheter with tips projecting near the superior cavoatrial junction. Prior median sternotomy and CABG. Similar cardiomegaly with central vascular prominence. Trace bilateral pleural effusions. Patchy opacities in the right lung base and left upper lobe. Mild diffuse interstitial opacities. Thoracic spondylosis. IMPRESSION: Cardiomegaly with pleural effusions and probable interstitial pulmonary edema. Patchy opacities in the right lung base and left upper lobe may reflect focal pulmonary edema or infection. Electronically Signed   By: Dahlia Bailiff M.D.   On: 12/14/2021 12:50         Latest Ref Rng & Units 05/30/2017    3:05 PM 06/12/2014    2:45 PM  PFT Results  FVC-Pre L 2.29  2.53   FVC-Predicted Pre % 77  82   FVC-Post L 2.31  2.64   FVC-Predicted Post % 77  86   Pre FEV1/FVC % % 77  86   Post FEV1/FCV % % 80  87   FEV1-Pre L 1.76  2.18   FEV1-Predicted Pre % 78  94   FEV1-Post L 1.86  2.31   DLCO uncorrected ml/min/mmHg 16.09  23.43   DLCO UNC% % 62  91   DLCO corrected ml/min/mmHg 16.57    DLCO COR %Predicted % 64    DLVA Predicted % 94  113   TLC L 3.90  4.94   TLC % Predicted % 75  94   RV % Predicted % 73  99     No results found for: "NITRICOXIDE"      Assessment & Plan:   COPD with asthma (HCC) Acute exacerbation with bronchospasm on  exam. Will treat with prednisone taper. Check CXR to rule out superimposed infection. Recommended we trial step up to triple therapy - pt recently just filled her Stiolto and asked if she could complete this before making the change. Advised that as long as her breathing is stable, she could finish out [employer] and then switch to [employer]. Given her asthmatic component, believe ICS will benefit her. Continue singulair for trigger prevention. Cough control measures. Close follow up.  Patient Instructions  Continue Stiolto 2 puffs daily until you have finished your current inhaler then switch to Breztri 2 puffs Twice daily. Brush tongue and rinse mouth afterwards. Continue Albuterol inhaler 2 puffs or 3 mL neb every 6 hours as needed for shortness of breath or wheezing. Notify if symptoms persist despite rescue inhaler/neb use. Use nebulizer at least twice a day until symptoms are better Continue singulair 10 mg At bedtime  Continue flonase 2 sprays each nostril daily for allergies/nasal congestion  Prednisone taper. 4 tabs for 2 days, then 3 tabs for 2 days, 2 tabs for 2 days, then 1 tab for 2 days, then stop. Take in AM with food. Mucinex 600 mg Twice daily for chest congestion Delsym 2 tsp Twice daily for cough  Tessalon Perles 1 capsule Three times a day as needed for cough  Chest x ray today  Follow up in 2 weeks with Dr. Lamonte Sakai or Alanson Aly. If symptoms do not improve or worsen, please contact office for sooner follow up or seek emergency care.    Diastolic CHF, chronic (HCC) Appears compensated and euvolemic on exam. Given her worsening SOB  and wheezing with hx of CHF and ESRD, will evaluate CXR for pulmonary edema. She is on bumex and spironolactone at baseline. Follow up with cardiology as scheduled.    I spent 35 minutes of dedicated to the care of this patient on the date of this encounter to include pre-visit review of records, face-to-face time with the patient discussing  conditions above, post visit ordering of testing, clinical documentation with the electronic health record, making appropriate referrals as documented, and communicating necessary findings to members of the patients care team.  Clayton Bibles, NP 12/14/2021  Pt aware and understands NP's role.

## 2021-12-14 NOTE — Patient Instructions (Addendum)
Continue Stiolto 2 puffs daily until you have finished your current inhaler then switch to [employer] 2 puffs Twice daily. Brush tongue and rinse mouth afterwards. Continue Albuterol inhaler 2 puffs or 3 mL neb every 6 hours as needed for shortness of breath or wheezing. Notify if symptoms persist despite rescue inhaler/neb use. Use nebulizer at least twice a day until symptoms are better Continue singulair 10 mg At bedtime  Continue flonase 2 sprays each nostril daily for allergies/nasal congestion  Prednisone taper. 4 tabs for 2 days, then 3 tabs for 2 days, 2 tabs for 2 days, then 1 tab for 2 days, then stop. Take in AM with food. Mucinex 600 mg Twice daily for chest congestion Delsym 2 tsp Twice daily for cough  Tessalon Perles 1 capsule Three times a day as needed for cough  Chest x ray today  Follow up in 2 weeks with Dr. Lamonte Sakai or Alanson Aly. If symptoms do not improve or worsen, please contact office for sooner follow up or seek emergency care.

## 2021-12-19 ENCOUNTER — Other Ambulatory Visit: Payer: Self-pay | Admitting: *Deleted

## 2021-12-19 MED ORDER — APIXABAN 5 MG PO TABS: 5.0000 mg | ORAL_TABLET | Freq: Two times a day (BID) | ORAL | 3 refills | Status: DC

## 2021-12-26 NOTE — Telephone Encounter (Signed)
Called and spoke with patient. She verbalized understanding. Patient stated that she has an appointment with Joellen Jersey on Wednesday and if she had anymore questions she would ask her Wednesday morning when she comes in and see's her.   Nothing further needed.

## 2021-12-28 ENCOUNTER — Ambulatory Visit: Payer: Medicare Other | Admitting: Nurse Practitioner

## 2021-12-28 ENCOUNTER — Encounter: Payer: Self-pay | Admitting: Nurse Practitioner

## 2021-12-28 ENCOUNTER — Ambulatory Visit (INDEPENDENT_AMBULATORY_CARE_PROVIDER_SITE_OTHER): Payer: Medicare Other

## 2021-12-28 VITALS — BP 102/54 | HR 54 | Temp 98.3°F | Ht 65.0 in | Wt 196.0 lb

## 2021-12-28 DIAGNOSIS — R0609 Other forms of dyspnea: Secondary | ICD-10-CM | POA: Diagnosis not present

## 2021-12-28 DIAGNOSIS — N185 Chronic kidney disease, stage 5: Secondary | ICD-10-CM

## 2021-12-28 DIAGNOSIS — J810 Acute pulmonary edema: Secondary | ICD-10-CM

## 2021-12-28 DIAGNOSIS — E8770 Fluid overload, unspecified: Secondary | ICD-10-CM

## 2021-12-28 DIAGNOSIS — J449 Chronic obstructive pulmonary disease, unspecified: Secondary | ICD-10-CM

## 2021-12-28 DIAGNOSIS — J301 Allergic rhinitis due to pollen: Secondary | ICD-10-CM

## 2021-12-28 NOTE — Assessment & Plan Note (Signed)
Symptoms related to volume overload with evidence of pulmonary edema and bilateral trace pleural effusions on previous CXR. She is on HD and was last dialyzed yesterday. They were able to pull off more fluid than they have been; she has had trouble tolerating in the past. She does feel like this has helped her breathing. She is also on spironolactone and bumex, managed by cardiology. We will recheck CXR today to assess for improvement in volume status.   Patient Instructions  Continue Stiolto 2 puffs daily until you have finished your current inhaler then switch to Breztri 2 puffs Twice daily. Brush tongue and rinse mouth afterwards. Continue Albuterol inhaler 2 puffs or 3 mL neb every 6 hours as needed for shortness of breath or wheezing. Notify if symptoms persist despite rescue inhaler/neb use. Use nebulizer at least twice a day until symptoms are better Continue singulair 10 mg At bedtime  Continue flonase 2 sprays each nostril daily for allergies/nasal congestion  Repeat chest x ray today. We will call you with any abnormal results.   Follow up in 3 months with Dr. Lamonte Sakai or Alanson Aly. If symptoms do not improve or worsen, please contact office for sooner follow up or seek emergency care.

## 2021-12-28 NOTE — Assessment & Plan Note (Addendum)
Previously on peritoneal dialysis; now on HD. She had volume overload causing dyspnea at last OV. This is some improved; although, not entirely back to baseline. She continues to have ongoing orthopnea as well. She is scheduled for dialysis again tomorrow and they are going to try to pull more fluid, since she was able to tolerate larger volumes yesterday. Followed by nephrology.

## 2021-12-28 NOTE — Progress Notes (Signed)
$'@Patient'T$  ID: [name], female    DOB: January 16, 1941, 81 y.o.   MRN: [id number]  No chief complaint on file.   Referring provider: Allie Dimmer, MD  HPI: 81 year old female, never smoker followed for COPD with asthma.  She is a patient of Dr. Agustina Caroli and last seen in office 04/07/2021.  Past medical history significant for CHF, history of NSTEMI, CAD status post CABG, hypertension, allergic rhinitis, fatty liver, ESRD on dialysis, DM2, OA, depression, fibromyalgia, obesity.  TEST/EVENTS:  05/30/2017 PFTs: FVC 77, FEV1 83, ratio 80, TLC 75, DLCOcor 64 06/23/2020 Pulmonary perfusion: no perfusion defects noted  04/07/2021: OV with Dr. Lamonte Sakai.  Previously diagnosed with COPD related to fixed obstruction from asthma. Reported doing quite well with no recent exacerbations.  Functional capacity has improved with starting peritoneal dialysis.  Continued on Stiolto and as needed albuterol.  Continued Singulair and Flonase for allergic rhinitis.  DOE likely multifactorial with known COPD and heart disease; did have improvement with volume removal after starting dialysis.  12/14/2021: OV with Dameka Younker NP for increased shortness of breath and dry cough over the past 2-3 weeks. She feels like she has had some chest tightness and soreness with frequent coughing but denies any chest pain. She has noticed an occasional wheeze as well. She denies any hemoptysis, leg swelling, weight gain, fevers, night sweats, recent sick exposures. She continues on Stiolto daily. Doesn't use albuterol often. She was treated for possible AECOPD with prednisone taper. CXR without superimposed infection but evidence of pulmonary edema. She was recommended to contact her cardiologist and nephrologist to determine next steps as she is on dialysis and already treated with bumex and spironolactone.   12/28/2021: Today - follow up Patient presents today for follow up with her daughter. She is feeling better since our last visit and  feels like her breathing has mostly returned to her baseline. She does still have some increased shortness of breath with longer distances or climbing but she said they pulled off "3 pounds" of fluid yesterday, when they usually only do 1, and she felt better after this. Her cough has resolved. It is still hard for her to lay completely flat at night but did feel a little more relief last night after treatment. She feels like pulling more fluid helped her more than the prednisone did but she did notice a boost in energy and wheezing improved with it. She is still using Stiolto but has the [employer] she was provided with last time and is going to try switching after her Stiolto runs out.   Allergies  Allergen Reactions   Carvedilol Other (See Comments)    confusion   Codeine Other (See Comments)    confusion   Linaclotide Nausea And Vomiting   Metoprolol Palpitations    Immunization History  Administered Date(s) Administered   DT (Pediatric) 01/02/2003   Hepatitis B, ped/adol 01/05/2021   Influenza Split 05/14/1999, 04/09/2000, 05/04/2001, 04/11/2002, 04/10/2003, 04/13/2004, 05/16/2005, 04/16/2006, 04/16/2007, 08/06/2008, 04/14/2009   Influenza Whole 04/08/2010, 04/18/2011, 04/12/2012, 04/28/2013, 04/03/2017   Influenza, High Dose Seasonal PF 04/20/2014, 03/15/2015, 03/14/2016, 04/29/2018, 05/06/2019, 03/22/2020, 05/02/2021   Influenza,inj,Quad PF,6+ Mos 04/03/2017   Influenza-Unspecified 03/26/2014, 05/16/2018, 04/07/2020, 05/02/2021   Moderna SARS-COV2 Booster Vaccination 05/10/2020, 06/07/2020   Moderna Sars-Covid-2 Vaccination 09/10/2019, 10/07/2019   PPD Test 12/13/2020   Pneumococcal Conjugate-13 04/09/2000, 05/13/2008, 03/26/2014, 04/20/2014, 08/04/2015   Pneumococcal Polysaccharide-23 06/17/2018   Pneumococcal-Unspecified 06/17/2018    Past Medical History:  Diagnosis Date   Anemia    Arthritis  Asthma    Atrial fibrillation [facility])    Diagnosed October 2022    Bilateral pleural effusion    Postoperative, status post Pleurx catheter and talc treatment - Dr. Prescott Gum   CAD (coronary artery disease)    Multivessel status post CABG 05/2014 -  LIMA to LAD, SVG to diagonal, SVG to OM, SVG to PDA   Chronic lower back pain    COPD (chronic obstructive pulmonary disease) ([place])    ESRD (end stage renal disease) ([place])    Essential hypertension    Fibromyalgia    History of pneumonia    Hyperlipidemia    Myocardial infarction ([place])    Type II diabetes mellitus ([place])     Tobacco History: Social History   Tobacco Use  Smoking Status Never  Smokeless Tobacco Never   Counseling given: Not Answered   Outpatient Medications Prior to Visit  Medication Sig Dispense Refill   albuterol (PROVENTIL) (2.5 MG/3ML) 0.083% nebulizer solution Take 3 mLs (2.5 mg total) by nebulization every 6 (six) hours as needed for wheezing or shortness of breath. 75 mL 12   albuterol (VENTOLIN HFA) 108 (90 Base) MCG/ACT inhaler Inhale 2 puffs into the lungs every 4 (four) hours as needed for wheezing or shortness of breath. 8 g 5   apixaban (ELIQUIS) 5 MG TABS tablet Take 1 tablet (5 mg total) by mouth 2 (two) times daily. 180 tablet 3   atorvastatin (LIPITOR) 40 MG tablet Take 40 mg by mouth every evening.     benzonatate (TESSALON) 200 MG capsule Take 1 capsule (200 mg total) by mouth 3 (three) times daily as needed for cough. 30 capsule 1   bisoprolol (ZEBETA) 10 MG tablet Take 1 tablet (10 mg total) by mouth daily. 90 tablet 3   Budeson-Glycopyrrol-Formoterol (BREZTRI AEROSPHERE) 160-9-4.8 MCG/ACT AERO Inhale 2 puffs into the lungs in the morning and at bedtime. 10.7 g 0   bumetanide (BUMEX) 2 MG tablet Take 4 mg by mouth 2 (two) times daily.     busPIRone (BUSPAR) 5 MG tablet Take 5 mg by mouth daily as needed (anxiety).     calcitRIOL (ROCALTROL) 0.25 MCG capsule Take 0.25 mcg by mouth daily.     Cholecalciferol (VITAMIN D3) 2000 units TABS Take 4,000-6,000 Units by  mouth See admin instructions. Take 4000 units by mouth every other day alternating with 6000 units on alternate days     diltiazem (CARDIZEM CD) 240 MG 24 hr capsule Take 240 mg by mouth daily.     fluticasone (FLONASE) 50 MCG/ACT nasal spray Place 1 spray into both nostrils daily.      hydrALAZINE (APRESOLINE) 50 MG tablet Take 1 tablet (50 mg total) by mouth 2 (two) times daily. 180 tablet 1   HYDROcodone-acetaminophen (NORCO/VICODIN) 5-325 MG tablet Take 1 tablet by mouth every 6 (six) hours as needed.     insulin glargine (LANTUS) 100 UNIT/ML injection Inject 0.3 mLs (30 Units total) into the skin at bedtime. (Patient taking differently: Inject 20 Units into the skin at bedtime.) 10 mL 11   insulin lispro (HUMALOG) 100 UNIT/ML KiwkPen Inject 8 Units into the skin 3 (three) times daily with meals.     isosorbide mononitrate (IMDUR) 60 MG 24 hr tablet TAKE ONE TABLET BY MOUTH DAILY 90 tablet 1   lactulose (CHRONULAC) 10 GM/15ML solution Take 30 g by mouth daily as needed for mild constipation.     losartan (COZAAR) 100 MG tablet Take 100 mg by mouth daily.  Multiple Vitamin (MULTIVITAMIN WITH MINERALS) TABS tablet Take 1 tablet by mouth daily.     naloxone (NARCAN) nasal spray 4 mg/0.1 mL Place 1 spray into the nose daily as needed (accidental overdose.). Use as directed.     nitroGLYCERIN (NITROSTAT) 0.4 MG SL tablet Place 1 tablet (0.4 mg total) under the tongue every 5 (five) minutes x 3 doses as needed for chest pain (if no relief after 2nd dose, proceed to ED or call 911). 25 tablet 3   predniSONE (DELTASONE) 10 MG tablet 4 tabs for 2 days, then 3 tabs for 2 days, 2 tabs for 2 days, then 1 tab for 2 days, then stop 20 tablet 0   primidone (MYSOLINE) 50 MG tablet Take 100 mg by mouth daily.   5   sodium bicarbonate 650 MG tablet Take 650 mg by mouth 2 (two) times daily.     spironolactone (ALDACTONE) 25 MG tablet Take 12.5 mg by mouth daily.     STIOLTO RESPIMAT 2.5-2.5 MCG/ACT AERS INHALE  2 PUFFS BY MOUTH DAILY 4 g 6   traMADol (ULTRAM) 50 MG tablet Take 100 mg by mouth every 8 (eight) hours as needed for moderate pain.      valACYclovir (VALTREX) 1000 MG tablet Take 1,000 mg by mouth 2 (two) times daily.     lidocaine (XYLOCAINE) 2 % solution Use as directed 10 mLs in the mouth or throat every 4 (four) hours as needed for mouth pain. (Patient not taking: Reported on 12/28/2021)     ondansetron (ZOFRAN) 4 MG tablet Take 4 mg by mouth every 8 (eight) hours as needed for nausea or vomiting. (Patient not taking: Reported on 12/28/2021)     No facility-administered medications prior to visit.     Review of Systems:   Constitutional: No weight loss or gain, night sweats, fevers, chills, fatigue, or lassitude. HEENT: No headaches, difficulty swallowing, tooth/dental problems, or sore throat. No sneezing, itching, ear ache, nasal congestion, or post nasal drip CV:  +orthopnea (improved). No chest pain, PND, swelling in lower extremities, anasarca, dizziness, palpitations, syncope Resp: +shortness of breath with exertion (improving); cough (resolved). No excess mucus or change in color of mucus. No hemoptysis. No wheezing.  No chest wall deformity GI:  No heartburn, indigestion, abdominal pain, nausea, vomiting, diarrhea, change in bowel habits, loss of appetite, bloody stools.  Skin: No rash, lesions, ulcerations MSK:  No joint pain or swelling.  No decreased range of motion.  No back pain. Neuro: No dizziness or lightheadedness.  Psych: No depression or anxiety. Mood stable.     Physical Exam:  BP (!) 102/54 (BP Location: Left Arm, Patient Position: Sitting, Cuff Size: Normal)   Pulse (!) 54   Temp 98.3 F (36.8 C) (Oral)   Ht '5\' 5"'$  (1.651 m)   Wt 196 lb (88.9 kg)   SpO2 92%   BMI 32.62 kg/m   GEN: Pleasant, interactive, well-appearing; obese; in no acute distress. HEENT:  Normocephalic and atraumatic. PERRLA. Sclera white. Nasal turbinates pink, moist and patent  bilaterally. No rhinorrhea present. Oropharynx pink and moist, without exudate or edema. No lesions, ulcerations, or postnasal drip.  NECK:  Supple w/ fair ROM. No JVD present. Normal carotid impulses w/o bruits. Thyroid symmetrical with no goiter or nodules palpated. No lymphadenopathy.   CV: RRR, no m/r/g, no peripheral edema. Pulses intact, +2 bilaterally. No cyanosis, pallor or clubbing. PULMONARY:  Unlabored, regular breathing. Diminished bases bilaterally otherwise clear A&P w/o wheezes/rales/rhonchi. No accessory muscle use. No  dullness to percussion. GI: BS present and normoactive. Soft, non-tender to palpation. No organomegaly or masses detected. No CVA tenderness. MSK: No erythema, warmth or tenderness. Cap refil <2 sec all extrem. No deformities or joint swelling noted.  Neuro: A/Ox3. No focal deficits noted.   Skin: Warm, no lesions or rashe Psych: Normal affect and behavior. Judgement and thought content appropriate.     Lab Results:  CBC    Component Value Date/Time   WBC 3.6 (L) 11/20/2020 0131   RBC 2.59 (L) 11/20/2020 0131   HGB 7.6 (L) 11/20/2020 0131   HCT 23.9 (L) 11/20/2020 0131   PLT 131 (L) 11/20/2020 0131   MCV 92.3 11/20/2020 0131   MCH 29.3 11/20/2020 0131   MCHC 31.8 11/20/2020 0131   RDW 13.2 11/20/2020 0131   LYMPHSABS 0.8 11/18/2020 1226   MONOABS 0.3 11/18/2020 1226   EOSABS 0.1 11/18/2020 1226   BASOSABS 0.0 11/18/2020 1226    BMET    Component Value Date/Time   NA 135 11/24/2020 0239   K 3.4 (L) 11/24/2020 0239   CL 98 11/24/2020 0239   CO2 27 11/24/2020 0239   GLUCOSE 160 (H) 11/24/2020 0239   BUN 73 (H) 11/24/2020 0239   CREATININE 3.39 (H) 11/24/2020 0239   CALCIUM 8.6 (L) 11/24/2020 0239   GFRNONAA 13 (L) 11/24/2020 0239   GFRAA 22 (L) 02/03/2020 0325    BNP    Component Value Date/Time   BNP 1,340.4 (H) 11/18/2020 1227     Imaging:  DG Chest 2 View  Result Date: 12/28/2021 CLINICAL DATA:  Pulmonary edema, follow-up. EXAM:  CHEST - 2 VIEW COMPARISON:  Chest radiograph December 14, 2021 FINDINGS: Dual-lumen right approach central venous catheter with tip overlying the superior cavoatrial junction. Prior median sternotomy and CABG. Similar enlarged cardiac silhouette and central vascular prominence. Similar right middle lobe atelectasis. Similar patchy bilateral opacities. Trace bilateral pleural effusions. The visualized skeletal structures are unchanged. Partially visualized anterior cervical fusion hardware. IMPRESSION: Similar right middle lobe atelectasis and patchy bilateral opacities again possibly reflecting focal areas of pulmonary edema versus infection. Consider further evaluation with chest CT if clinically indicated. Similar cardiomegaly and pleural effusions with possible interstitial edema. Electronically Signed   By: Dahlia Bailiff M.D.   On: 12/28/2021 12:19   DG Chest 2 View  Result Date: 12/14/2021 CLINICAL DATA:  Cough with increasing shortness of breath. EXAM: CHEST - 2 VIEW COMPARISON:  Radiograph Nov 18, 2020 FINDINGS: Right approach dual lumen central venous catheter with tips projecting near the superior cavoatrial junction. Prior median sternotomy and CABG. Similar cardiomegaly with central vascular prominence. Trace bilateral pleural effusions. Patchy opacities in the right lung base and left upper lobe. Mild diffuse interstitial opacities. Thoracic spondylosis. IMPRESSION: Cardiomegaly with pleural effusions and probable interstitial pulmonary edema. Patchy opacities in the right lung base and left upper lobe may reflect focal pulmonary edema or infection. Electronically Signed   By: Dahlia Bailiff M.D.   On: 12/14/2021 12:50         Latest Ref Rng & Units 05/30/2017    3:05 PM 06/12/2014    2:45 PM  PFT Results  FVC-Pre L 2.29  2.53   FVC-Predicted Pre % 77  82   FVC-Post L 2.31  2.64   FVC-Predicted Post % 77  86   Pre FEV1/FVC % % 77  86   Post FEV1/FCV % % 80  87   FEV1-Pre L 1.76  2.18    FEV1-Predicted Pre % 78  94   FEV1-Post L 1.86  2.31   DLCO uncorrected ml/min/mmHg 16.09  23.43   DLCO UNC% % 62  91   DLCO corrected ml/min/mmHg 16.57    DLCO COR %Predicted % 64    DLVA Predicted % 94  113   TLC L 3.90  4.94   TLC % Predicted % 75  94   RV % Predicted % 73  99     No results found for: "NITRICOXIDE"      Assessment & Plan:   Dyspnea Symptoms related to volume overload with evidence of pulmonary edema and bilateral trace pleural effusions on previous CXR. She is on HD and was last dialyzed yesterday. They were able to pull off more fluid than they have been; she has had trouble tolerating in the past. She does feel like this has helped her breathing. She is also on spironolactone and bumex, managed by cardiology. We will recheck CXR today to assess for improvement in volume status.   Patient Instructions  Continue Stiolto 2 puffs daily until you have finished your current inhaler then switch to Breztri 2 puffs Twice daily. Brush tongue and rinse mouth afterwards. Continue Albuterol inhaler 2 puffs or 3 mL neb every 6 hours as needed for shortness of breath or wheezing. Notify if symptoms persist despite rescue inhaler/neb use. Use nebulizer at least twice a day until symptoms are better Continue singulair 10 mg At bedtime  Continue flonase 2 sprays each nostril daily for allergies/nasal congestion  Repeat chest x ray today. We will call you with any abnormal results.   Follow up in 3 months with Dr. Lamonte Sakai or Alanson Aly. If symptoms do not improve or worsen, please contact office for sooner follow up or seek emergency care.    Stage 5 chronic kidney disease (HCC) Previously on peritoneal dialysis; now on HD. She had volume overload causing dyspnea at last OV. This is some improved; although, not entirely back to baseline. She continues to have ongoing orthopnea as well. She is scheduled for dialysis again tomorrow and they are going to try to pull more  fluid, since she was able to tolerate larger volumes yesterday. Followed by nephrology.  COPD with asthma ([place]) AECOPD/asthma vs volume overload. She was treated with a prednisone taper, and had resolution of her wheezing and improved cough. She still was having dyspnea, but this is improving with volume correction after HD. No infectious symptoms. We will continue with plan to trial step up to [facility] in order to add ICS. She was provided with samples last time and will let us know if she feels like this positively impacts her breathing so we can send rx to change from [employer].   Allergic rhinitis Well-controlled on flonase and singulair.     I spent 35 minutes of dedicated to the care of this patient on the date of this encounter to include pre-visit review of records, face-to-face time with the patient discussing conditions above, post visit ordering of testing, clinical documentation with the electronic health record, making appropriate referrals as documented, and communicating necessary findings to members of the patients care team.  Clayton Bibles, NP 12/28/2021  Pt aware and understands NP's role.

## 2021-12-28 NOTE — Assessment & Plan Note (Addendum)
AECOPD/asthma vs volume overload. She was treated with a prednisone taper, and had resolution of her wheezing and improved cough. She still was having dyspnea, but this is improving with volume correction after HD. No infectious symptoms. We will continue with plan to trial step up to [facility] in order to add ICS. She was provided with samples last time and will let us know if she feels like this positively impacts her breathing so we can send rx to change from [employer].

## 2021-12-28 NOTE — Assessment & Plan Note (Signed)
Well-controlled on flonase and singulair.

## 2021-12-28 NOTE — Patient Instructions (Addendum)
Continue Stiolto 2 puffs daily until you have finished your current inhaler then switch to [employer] 2 puffs Twice daily. Brush tongue and rinse mouth afterwards. Continue Albuterol inhaler 2 puffs or 3 mL neb every 6 hours as needed for shortness of breath or wheezing. Notify if symptoms persist despite rescue inhaler/neb use. Use nebulizer at least twice a day until symptoms are better Continue singulair 10 mg At bedtime  Continue flonase 2 sprays each nostril daily for allergies/nasal congestion  Repeat chest x ray today. We will call you with any abnormal results.   Follow up in 3 months with Dr. Lamonte Sakai or Alanson Aly. If symptoms do not improve or worsen, please contact office for sooner follow up or seek emergency care.

## 2021-12-29 NOTE — Progress Notes (Signed)
Please notify patient that her CXR from yesterday showed persistent pulmonary edema. Given her clinical improvement, would not make any changes to her current regimen. I would advise that they continue to pull off larger volume amounts during her dialysis treatments, if she is able to tolerate it. Thanks.

## 2022-01-11 ENCOUNTER — Telehealth: Payer: Self-pay | Admitting: Emergency Medicine

## 2022-01-11 DIAGNOSIS — J45901 Unspecified asthma with (acute) exacerbation: Secondary | ICD-10-CM

## 2022-01-11 NOTE — Telephone Encounter (Signed)
Called patient but she did not answer. Left message for her to call back.  

## 2022-01-12 MED ORDER — BENZONATATE 200 MG PO CAPS: 200.0000 mg | ORAL_CAPSULE | Freq: Three times a day (TID) | ORAL | 1 refills | Status: DC | PRN

## 2022-01-12 NOTE — Telephone Encounter (Signed)
Called and spoke with patient. She verbalized understanding. Tessalon has been sent in for her.   Nothing further needed at time of call.

## 2022-01-12 NOTE — Telephone Encounter (Signed)
Pt returned call. States symptoms began about 2 weeks ago and was in the office to see [facility] 7/5. Pt was prescribed prednisone and was told to get OTC cough med. Pt said that the OTC cough med has not helped at all.  Pt said that the prednisone did help with her breathing but now she is finished with it. Now pt said that when she takes a deep breath, she is having pain and also states that she has pain between her shoulder blades from all the coughing.  Pt said her cough is a dry hacky cough and she is not able to cough up any phlegm. Denies any complaints of wheezing at this time but said when she was at dialysis, they heard crackles when they listened to her. Pt denies any complaints of fever. Pt said today at dialysis, she had 2.8 kilos of fluid removed.  When asked pt which inhaler she was currently taking, she said she was doing the sample of Judithann Sauger that she was given but said that she likes the [employer] better than the [employer]. Pt said that she has had to use her albuterol neb sol at least once a day, states that she has not used her rescue inhaler.  Pt has an Rx for tessalon on med list but pt states that she does not remember taking that med.  Pt is asking for something to be prescribed to help with her cough. Dr. Lamonte Sakai, please advise.

## 2022-01-12 NOTE — Telephone Encounter (Signed)
Okay to send another prescription for Tessalon 100 mg every 6 hours as needed for cough Also okay to change her back to Stiolto if she prefers that medication

## 2022-01-19 ENCOUNTER — Telehealth: Payer: Self-pay | Admitting: Cardiology

## 2022-01-19 MED ORDER — HYDRALAZINE HCL 50 MG PO TABS: 50.0000 mg | ORAL_TABLET | Freq: Two times a day (BID) | ORAL | 1 refills | Status: AC

## 2022-01-19 NOTE — Telephone Encounter (Signed)
Done

## 2022-01-19 NOTE — Telephone Encounter (Signed)
*  STAT* If patient is at the pharmacy, call can be transferred to refill team.   1. Which medications need to be refilled? (please list name of each medication and dose if known) hydrALAZINE (APRESOLINE) 50 MG tablet  2. Which pharmacy/location (including street and city if local pharmacy) is medication to be sent to? [facility] [phone] - [place] [address]  3. Do they need a 30 day or 90 day supply? [place]

## 2022-01-24 NOTE — Telephone Encounter (Signed)
Pt c/o medication issue:  1. Name of Medication:   hydrALAZINE (APRESOLINE) 50 MG tablet  2. How are you currently taking this medication (dosage and times per day)? 1 tablet 3x day  3. Are you having a reaction (difficulty breathing--STAT)? No  4. What is your medication issue?   Patient called to clarify the correct dosage for this medication.  Patient stated she had been taking 1 tablet 3 times daily but the prescription is written for 1 tablet twice daily.

## 2022-02-06 ENCOUNTER — Telehealth: Payer: Self-pay | Admitting: Emergency Medicine

## 2022-02-06 MED ORDER — BREZTRI AEROSPHERE 160-9-4.8 MCG/ACT IN AERO: 2.0000 | INHALATION_SPRAY | Freq: Two times a day (BID) | RESPIRATORY_TRACT | 0 refills | Status: DC

## 2022-02-06 NOTE — Telephone Encounter (Signed)
Called and spoke with patient. Patient verbalized understanding. Nothing further needed.  

## 2022-02-06 NOTE — Telephone Encounter (Signed)
I'm ok trying this. Make sure she knows that she has to rinse and gargle after Breztri.  2 puffs bid.

## 2022-02-06 NOTE — Telephone Encounter (Signed)
Called and spoke with patient. Patient stated that she wanted to switch to breztri from stiolto and wanted to make sure it was okay to do so.   RB, please advise.

## 2022-02-09 ENCOUNTER — Telehealth: Payer: Self-pay | Admitting: Emergency Medicine

## 2022-02-09 MED ORDER — BREZTRI AEROSPHERE 160-9-4.8 MCG/ACT IN AERO: 2.0000 | INHALATION_SPRAY | Freq: Two times a day (BID) | RESPIRATORY_TRACT | 5 refills | Status: DC

## 2022-02-09 NOTE — Telephone Encounter (Signed)
Refill corrected and resent. Pt notified. Nothing further needed at this time.

## 2022-02-14 ENCOUNTER — Ambulatory Visit: Payer: Medicare Other | Admitting: Cardiology

## 2022-03-03 ENCOUNTER — Ambulatory Visit: Payer: Medicare Other | Attending: Cardiology | Admitting: Cardiology

## 2022-03-03 ENCOUNTER — Encounter: Payer: Self-pay | Admitting: Cardiology

## 2022-03-03 VITALS — BP 158/62 | HR 56 | Ht 65.0 in | Wt 197.0 lb

## 2022-03-03 DIAGNOSIS — I25119 Atherosclerotic heart disease of native coronary artery with unspecified angina pectoris: Secondary | ICD-10-CM

## 2022-03-03 DIAGNOSIS — I350 Nonrheumatic aortic (valve) stenosis: Secondary | ICD-10-CM

## 2022-03-03 DIAGNOSIS — I480 Paroxysmal atrial fibrillation: Secondary | ICD-10-CM | POA: Diagnosis not present

## 2022-03-03 NOTE — Patient Instructions (Signed)

## 2022-03-03 NOTE — Progress Notes (Signed)
Cardiology Office Note  Date: 03/03/2022   ID: Joyce [name], DOB [birth year], MRN [id number]  PCP:  Allie Dimmer, MD  Cardiologist:  Rozann Lesches, MD Electrophysiologist:  None   Chief Complaint  Patient presents with   Cardiac follow-up    History of Present Illness: Joyce [name] is a 81 y.o. female last seen in February.  She is here for a follow-up visit.  States that overall she has been feeling fairly well.  He continues to dialyze on Tuesdays, Thursdays, and Saturdays.  Plans to have peritoneal dialysis catheter replaced in [facility] next Wednesday and hopefully go back to peritoneal dialysis which she enjoyed before.  She does not report any angina, no palpitations to suggest breakthrough atrial fibrillation.  I reviewed her medications which are noted below.  She has easy bruising but no spontaneous bleeding on Eliquis.  No change in the remainder of her cardiac regimen.  Echocardiogram from November of last year showed LVEF approximately 60 to 65% with mild aortic stenosis.  Past Medical History:  Diagnosis Date   Anemia    Arthritis    Asthma    Atrial fibrillation [facility])    Diagnosed October 2022   Bilateral pleural effusion    Postoperative, status post Pleurx catheter and talc treatment - Dr. Prescott Gum   CAD (coronary artery disease)    Multivessel status post CABG 05/2014 -  LIMA to LAD, SVG to diagonal, SVG to OM, SVG to PDA   Chronic lower back pain    COPD (chronic obstructive pulmonary disease) ([place])    ESRD (end stage renal disease) ([place])    Essential hypertension    Fibromyalgia    History of pneumonia    Hyperlipidemia    Myocardial infarction ([place])    Type II diabetes mellitus ([place])     Past Surgical History:  Procedure Laterality Date   ABDOMINAL HYSTERECTOMY  1980's   ANTERIOR CERVICAL DECOMP/DISCECTOMY FUSION  2000's   APPENDECTOMY  1970's   BACK SURGERY     BIOPSY  08/28/2019   Procedure: BIOPSY;  Surgeon: Rogene Houston, MD;   [facility];  Service: Endoscopy;;  duodenal   CHEST TUBE INSERTION Left 08/07/2014   Procedure: INSERTION PLEURAL DRAINAGE CATHETER;  Surgeon: Ivin Poot, MD;  Location: [place];  Service: Thoracic;  Laterality: Left;   CHEST TUBE INSERTION Right 08/12/2014   Procedure: INSERTION PLEURAL DRAINAGE CATHETER;  Surgeon: Ivin Poot, MD;  Location: [place];  Service: Thoracic;  Laterality: Right;   CHOLECYSTECTOMY  ~ 2005   COLONOSCOPY N/A 08/28/2019   Procedure: COLONOSCOPY;  Surgeon: Rogene Houston, MD;  [facility];  Service: Endoscopy;  Laterality: N/A;  100   CORONARY ARTERY BYPASS GRAFT N/A 06/15/2014   Procedure: CORONARY ARTERY BYPASS GRAFTING (CABG);  Surgeon: Ivin Poot, MD;  Location: [place];  Service: Open Heart Surgery;  Laterality: N/A;   CORONARY ATHERECTOMY N/A 05/22/2018   Procedure: CORONARY ATHERECTOMY;  Surgeon: Belva Crome, MD;  Location: [place] CV LAB;  Service: Cardiovascular;  Laterality: N/A;   CORONARY STENT INTERVENTION N/A 05/22/2018   Procedure: CORONARY STENT INTERVENTION;  Surgeon: Belva Crome, MD;  Location: [place] CV LAB;  Service: Cardiovascular;  Laterality: N/A;  rca    ESOPHAGOGASTRODUODENOSCOPY N/A 08/28/2019   Procedure: ESOPHAGOGASTRODUODENOSCOPY (EGD);  Surgeon: Rogene Houston, MD;  [facility];  Service: Endoscopy;  Laterality: N/A;   INTRAOPERATIVE TRANSESOPHAGEAL ECHOCARDIOGRAM N/A 06/15/2014  Procedure: INTRAOPERATIVE TRANSESOPHAGEAL ECHOCARDIOGRAM;  Surgeon: Ivin Poot, MD;  Location: [place];  Service: Open Heart Surgery;  Laterality: N/A;   LEFT HEART CATH AND CORS/GRAFTS ANGIOGRAPHY N/A 05/20/2018   Procedure: LEFT HEART CATH AND CORS/GRAFTS ANGIOGRAPHY;  Surgeon: Lorretta Harp, MD;  Location: [place] [facility];  Service: Cardiovascular;  Laterality: N/A;   LEFT HEART CATHETERIZATION WITH CORONARY ANGIOGRAM N/A 06/11/2014   Procedure: LEFT HEART CATHETERIZATION WITH CORONARY  ANGIOGRAM;  Surgeon: Blane Ohara, MD;  Location: [facility] CATH LAB;  Service: Cardiovascular;  Laterality: N/A;   LUMBAR DISC SURGERY  1980's X 1; 1990's X  2000's X 1   POLYPECTOMY  08/28/2019   Procedure: POLYPECTOMY;  Surgeon: Rogene Houston, MD;  [facility];  Service: Endoscopy;;   REMOVAL OF PLEURAL DRAINAGE CATHETER Bilateral 02/11/2015   Procedure: REMOVAL OF PLEURAL DRAINAGE CATHETER;  Surgeon: Ivin Poot, MD;  Location: [place];  Service: Thoracic;  Laterality: Bilateral;   TALC PLEURODESIS Bilateral 02/11/2015   Procedure: Pietro Cassis;  Surgeon: Ivin Poot, MD;  Location: [place];  Service: Thoracic;  Laterality: Bilateral;   TONSILLECTOMY  ~ [place] Right 02/02/2020   Procedure: RIGHT TOTAL HIP ARTHROPLASTY ANTERIOR APPROACH;  Surgeon: Frederik Pear, MD;  Location: [facility];  Service: Orthopedics;  Laterality: Right;    Current Outpatient Medications  Medication Sig Dispense Refill   albuterol (PROVENTIL) (2.5 MG/3ML) 0.083% nebulizer solution Take 3 mLs (2.5 mg total) by nebulization every 6 (six) hours as needed for wheezing or shortness of breath. 75 mL 12   albuterol (VENTOLIN HFA) 108 (90 Base) MCG/ACT inhaler Inhale 2 puffs into the lungs every 4 (four) hours as needed for wheezing or shortness of breath. 8 g 5   apixaban (ELIQUIS) 5 MG TABS tablet Take 1 tablet (5 mg total) by mouth 2 (two) times daily. 180 tablet 3   atorvastatin (LIPITOR) 40 MG tablet Take 40 mg by mouth every evening.     bisoprolol (ZEBETA) 10 MG tablet Take 1 tablet (10 mg total) by mouth daily. 90 tablet 3   Budeson-Glycopyrrol-Formoterol (BREZTRI AEROSPHERE) 160-9-4.8 MCG/ACT AERO Inhale 2 puffs into the lungs in the morning and at bedtime. 10.7 g 5   bumetanide (BUMEX) 2 MG tablet Take 4 mg by mouth 2 (two) times daily.     busPIRone (BUSPAR) 5 MG tablet Take 5 mg by mouth daily as needed (anxiety).     calcitRIOL (ROCALTROL) 0.25 MCG capsule Take 0.25 mcg by  mouth daily.     Cholecalciferol (VITAMIN D3) 2000 units TABS Take 4,000-6,000 Units by mouth See admin instructions. Take 4000 units by mouth every other day alternating with 6000 units on alternate days     diltiazem (CARDIZEM CD) 240 MG 24 hr capsule Take 240 mg by mouth daily.     fluticasone (FLONASE) 50 MCG/ACT nasal spray Place 1 spray into both nostrils daily.      hydrALAZINE (APRESOLINE) 50 MG tablet Take 1 tablet (50 mg total) by mouth 2 (two) times daily. 180 tablet 1   insulin glargine (LANTUS) 100 UNIT/ML injection Inject 0.3 mLs (30 Units total) into the skin at bedtime. (Patient taking differently: Inject 20 Units into the skin at bedtime.) 10 mL 11   insulin lispro (HUMALOG) 100 UNIT/ML KiwkPen Inject 8 Units into the skin 3 (three) times daily with meals.     isosorbide mononitrate (IMDUR) 60 MG 24 hr tablet TAKE ONE TABLET BY MOUTH DAILY  90 tablet 1   lactulose (CHRONULAC) 10 GM/15ML solution Take 30 g by mouth daily as needed for mild constipation.     losartan (COZAAR) 100 MG tablet Take 50 mg by mouth 2 (two) times daily.     naloxone (NARCAN) nasal spray 4 mg/0.1 mL Place 1 spray into the nose daily as needed (accidental overdose.). Use as directed.     nitroGLYCERIN (NITROSTAT) 0.4 MG SL tablet Place 1 tablet (0.4 mg total) under the tongue every 5 (five) minutes x 3 doses as needed for chest pain (if no relief after 2nd dose, proceed to ED or call 911). 25 tablet 3   primidone (MYSOLINE) 50 MG tablet Take 100 mg by mouth daily.   5   sodium bicarbonate 650 MG tablet Take 650 mg by mouth 2 (two) times daily.     spironolactone (ALDACTONE) 25 MG tablet Take 12.5 mg by mouth daily.     traMADol (ULTRAM) 50 MG tablet Take 100 mg by mouth every 8 (eight) hours as needed for moderate pain.      No current facility-administered medications for this visit.   Allergies:  Carvedilol, Codeine, Linaclotide, and Metoprolol   ROS: Syncope.  Physical Exam: VS:  BP (!) 158/62    Pulse (!) 56   Ht '5\' 5"'$  (1.651 m)   Wt 197 lb (89.4 kg)   SpO2 94%   BMI 32.78 kg/m , BMI Body mass index is 32.78 kg/m.  Wt Readings from Last 3 Encounters:  03/03/22 197 lb (89.4 kg)  12/28/21 196 lb (88.9 kg)  12/14/21 194 lb 12.8 oz (88.4 kg)    General: Patient appears comfortable at rest. HEENT: Conjunctiva and lids normal. Neck: Supple, no elevated JVP or carotid bruits, no thyromegaly. Lungs: Clear to auscultation, nonlabored breathing at rest. Cardiac: Regular rate and rhythm, no S3, 2/6 systolic murmur. Extremities: No pitting edema.  ECG:  An ECG dated 05/02/2021 was personally reviewed today and demonstrated:  Sinus rhythm.  Recent Labwork:  August 2023: TSH 4.27, potassium 4.9, BUN 48, creatinine 4.99, AST 13, ALT 12, cholesterol 97, triglycerides 225, HDL 39, LDL 13, hemoglobin 10.8, platelets 138, hemoglobin A1c 6.4%  Other Studies Reviewed Today:  Echocardiogram 04/26/2021 [facility] clinic): Summary   1. Overall left ventricular ejection fraction is estimated at 60 to 65%.   2. Normal global left ventricular systolic function.   3. Moderate concentric left ventricular hypertrophy.   4. Normal left ventricular diastolic filling.   5. Biplane EF 66%. 3D LVEF 62%.   6. TAPSE 22 mm c/w NL RVF.   7. There is no evidence of pericardial effusion.   8. Mild aortic stenosis.   9. There is moderate aortic valve sclerosis.  10. No intracardiac thrombi, mass or vegetations.    Assessment and Plan:  1.  Multivessel CAD status post CABG in 2015 with graft disease and subsequent stent intervention to the RCA in 2019.  We will continue to follow her expectantly on medical therapy, no interval angina symptoms.  She is not on aspirin given use of Eliquis.  Continue bisoprolol, Lipitor, Imdur, Cozaar, and as needed nitroglycerin.  2.  Paroxysmal atrial fibrillation with CHA2DS2-VASc score of 6.  She is asymptomatic without recurrent palpitations.  Also on Cardizem CD along  with Eliquis.  I reviewed her recent lab work per PCP.  She does not report any spontaneous bleeding problems.  3.  ESRD on hemodialysis with plan to reinstitute peritoneal dialysis.  4.  Essential hypertension, blood pressure  up today but trend has been better overall.  No changes made to present regimen.  5.  Mild aortic stenosis.  Continue to follow.  Medication Adjustments/Labs and Tests Ordered: Current medicines are reviewed at length with the patient today.  Concerns regarding medicines are outlined above.   Tests Ordered: No orders of the defined types were placed in this encounter.   Medication Changes: No orders of the defined types were placed in this encounter.   Disposition:  Follow up  6 months.  Signed, Satira Sark, MD, [facility] 03/03/2022 11:44 AM    [facility] [address], [place], [place] [address] Phone: [phone]; Fax: [phone]

## 2022-03-17 ENCOUNTER — Other Ambulatory Visit: Payer: Self-pay | Admitting: Cardiology

## 2022-03-17 NOTE — Telephone Encounter (Signed)
Pt is in on primidone and Eliquis. Talked to pt who stated that she has not had any problem. Spoke with Rutha Bouchard D concerning the interaction between primidone and eliquis. Pt can continue Eliquis and Primidone. Refill for Eliquis sent.

## 2022-03-17 NOTE — Telephone Encounter (Signed)
Prescription refill request for Eliquis received.  Indication: afib  Last office visit: [place], 03/03/2022 Scr: 2.89, 05/29/2021, 4.99, 02/13/2022 Age: 81 yo  Weight: 89.4 kg

## 2022-04-04 ENCOUNTER — Ambulatory Visit (INDEPENDENT_AMBULATORY_CARE_PROVIDER_SITE_OTHER): Payer: Medicare Other | Admitting: Emergency Medicine

## 2022-04-04 ENCOUNTER — Encounter: Payer: Self-pay | Admitting: Emergency Medicine

## 2022-04-04 DIAGNOSIS — J4489 Other specified chronic obstructive pulmonary disease: Secondary | ICD-10-CM | POA: Diagnosis not present

## 2022-04-04 DIAGNOSIS — R0609 Other forms of dyspnea: Secondary | ICD-10-CM | POA: Diagnosis not present

## 2022-04-04 MED ORDER — STIOLTO RESPIMAT 2.5-2.5 MCG/ACT IN AERS: 2.0000 | INHALATION_SPRAY | Freq: Every day | RESPIRATORY_TRACT | 0 refills | Status: DC

## 2022-04-04 NOTE — Progress Notes (Signed)
Subjective:    Patient ID: Joyce [name], female    DOB: April 29, 1941, 81 y.o.   MRN: [id number]   Shortness of Breath Pertinent negatives include no ear pain, fever, headaches, leg swelling, rash, rhinorrhea, sore throat, vomiting or wheezing.    ROV 04/07/21 --81 year old woman with history of CAD/CABG, hypertension with diastolic dysfunction, chronic renal failure.  I have followed her for COPD/asthma with some associated restrictive lung disease.  She notes that she had to start peritoneal dialysis about 3 months ago, feels much better since she has initiated it.  She is still managed on Stiolto, Singulair, fluticasone nasal spray. She reports that her breathing is much better than our last visit. Rare albuterol use - her insurance stopped paying for it, ? Need a formulary change. No flares. Still has some nasal congestion every morning - minimal cough.  Getting flu shot in 2 weeks. COVID vaccine booster is due.   ROV 04/04/2022 --Joyce [name] is 24 and follows up today for her history of COPD/fixed asthma, some associated restrictive lung disease.  PMH also significant for CAD/CABG, hypertension with diastolic dysfunction, chronic renal failure now on HD.  She had COVID-19 in July 2023, then had it again about 3 weeks ago.  Since last time we have changed her from [employer] to [place].  Today she reports that she remains very fatigued. Her cough is improving but more than her usual baseline. She was seen by Dr Jenny Reichmann. She has mixed feelings about the breztri - remains unsure about whether it helps consistently. Minimal albuterol use - did need it during covid. Flu shot up to date.    Review of Systems  Constitutional:  Negative for fever and unexpected weight change.  HENT:  Negative for congestion, dental problem, ear pain, nosebleeds, postnasal drip, rhinorrhea, sinus pressure, sneezing, sore throat and trouble swallowing.   Eyes:  Negative for redness and itching.  Respiratory:  Positive for  shortness of breath. Negative for cough, chest tightness and wheezing.   Cardiovascular:  Negative for palpitations and leg swelling.  Gastrointestinal:  Negative for nausea and vomiting.  Genitourinary:  Negative for dysuria.  Musculoskeletal:  Negative for joint swelling.  Skin:  Negative for rash.  Neurological:  Negative for headaches.  Hematological:  Does not bruise/bleed easily.  Psychiatric/Behavioral:  Negative for dysphoric mood. The patient is not nervous/anxious.        Objective:   Physical Exam Vitals:   04/04/22 1101  BP: (!) 144/70  Pulse: (!) 55  Temp: 98 F (36.7 C)  TempSrc: Oral  SpO2: 93%  Weight: 208 lb 9.6 oz (94.6 kg)  Height: '5\' 5"'$  (1.651 m)   Gen: Pleasant, well-nourished, in no distress,  normal affect  ENT: No lesions,  mouth clear,  oropharynx clear, no postnasal drip  Neck: No JVD, no Stridor  Lungs: No use of accessory muscles, Good air movement, no wheeze or crackles  Cardiovascular: RRR, 3/6 syst M w intact S2  Musculoskeletal: No deformities, no cyanosis or clubbing  Neuro: alert, non focal  Skin: Warm, no lesions or rashes      Assessment & Plan:  COPD with asthma ([place]) We will give you a sample of Stiolto to use 2 puffs once daily for about 10 days.  Keep track of whether you prefer this medication to the [facility].  Call us and let us know so we can decide which medicine to continue long-term. Keep albuterol available to use 2 puffs if needed for shortness of breath, chest  tightness, wheezing. Flu shot is up-to-date You will need the COVID-19 vaccine 3 months after your recent infection Would recommend getting the RSV vaccine Follow with Dr Lamonte Sakai in 6 months or sooner if you have any problems  Dyspnea Recent COVID-19.  Still does not feel quite back to baseline but is improving.  Depending on how much she improves, any residual dyspnea we will decide whether we need to perform repeat imaging to evaluate for post-COVID effects,  possibly high-resolution CT scan of the chest.  Baltazar Apo, MD, PhD 04/04/2022, 11:30 AM [facility] [phone] or if no answer [phone]

## 2022-04-04 NOTE — Assessment & Plan Note (Signed)
Recent COVID-19.  Still does not feel quite back to baseline but is improving.  Depending on how much she improves, any residual dyspnea we will decide whether we need to perform repeat imaging to evaluate for post-COVID effects, possibly high-resolution CT scan of the chest.

## 2022-04-04 NOTE — Assessment & Plan Note (Signed)
We will give you a sample of Stiolto to use 2 puffs once daily for about 10 days.  Keep track of whether you prefer this medication to the [facility].  Call us and let us know so we can decide which medicine to continue long-term. Keep albuterol available to use 2 puffs if needed for shortness of breath, chest tightness, wheezing. Flu shot is up-to-date You will need the COVID-19 vaccine 3 months after your recent infection Would recommend getting the RSV vaccine Follow with Dr Lamonte Sakai in 6 months or sooner if you have any problems

## 2022-04-04 NOTE — Patient Instructions (Addendum)
We will give you a sample of Stiolto to use 2 puffs once daily for about 10 days.  Keep track of whether you prefer this medication to the [facility].  Call us and let us know so we can decide which medicine to continue long-term. Keep albuterol available to use 2 puffs if needed for shortness of breath, chest tightness, wheezing. Depending on how your breathing is doing going forward we may decide to repeat your chest x-ray or perform a CT scan of your chest to follow for any scarring that could be left over from COVID-19 Flu shot is up-to-date You will need the COVID-19 vaccine 3 months after your recent infection Would recommend getting the RSV vaccine Follow with Dr Lamonte Sakai in 6 months or sooner if you have any problems

## 2022-04-04 NOTE — Addendum Note (Signed)
Addended by: Gavin Potters R on: 04/04/2022 11:43 AM   Modules accepted: Orders

## 2022-04-13 ENCOUNTER — Telehealth: Payer: Self-pay | Admitting: Emergency Medicine

## 2022-04-13 DIAGNOSIS — J441 Chronic obstructive pulmonary disease with (acute) exacerbation: Secondary | ICD-10-CM

## 2022-04-13 DIAGNOSIS — J45901 Unspecified asthma with (acute) exacerbation: Secondary | ICD-10-CM

## 2022-04-14 NOTE — Telephone Encounter (Signed)
Attempted to call pt but unable to reach. Left message to return call.  

## 2022-04-17 MED ORDER — STIOLTO RESPIMAT 2.5-2.5 MCG/ACT IN AERS: 2.0000 | INHALATION_SPRAY | Freq: Every day | RESPIRATORY_TRACT | 4 refills | Status: DC

## 2022-04-17 NOTE — Telephone Encounter (Signed)
Called and spoke with patient and she is ok with the ONO on room air. She states that she feels smothered at night. Order placed. Nothing further needed

## 2022-04-17 NOTE — Telephone Encounter (Signed)
Yes I agree, she needs an ONO on RA. Thanks.

## 2022-04-17 NOTE — Telephone Encounter (Signed)
Called and spoke with patient. She stated that she feels like the [place] works better for her vs the [employer]. She would like to have a prescription sent into [facility] in [place]. I advised her that I would go ahead and send it in for her. She verbalized understanding.   While on the phone, she mentioned that she would like to have an order placed for a POC to use at night. I advised her that POCs are not designed for night time use. She stated that she is having a hard time laying flat in bed at night. She feels like she is smothering. She confirmed that she is not currently using any oxygen. I advised her that Dr. Lamonte Sakai may order a ONO to see what her oxygen levels are at night to get her qualified for O2.   Dr. Lamonte Sakai, can you please advise? Thanks.

## 2022-04-17 NOTE — Telephone Encounter (Signed)
Patient is returning phone call. Patient phone number is [phone]. Please call after 11:00 am.

## 2022-06-07 ENCOUNTER — Telehealth: Payer: Self-pay | Admitting: Cardiology

## 2022-06-07 NOTE — Telephone Encounter (Signed)
Patient will no long see dr Domenic Polite seen another dr that is closer. Please advise

## 2022-07-05 ENCOUNTER — Telehealth: Payer: Self-pay | Admitting: Emergency Medicine

## 2022-07-05 NOTE — Telephone Encounter (Signed)
Patient would like the nurse or doctor to call regarding her night pulse ox.  She stated that she has not been feeling well and would like to know what the doctor's recommendation is.  CB# [phone]

## 2022-07-05 NOTE — Telephone Encounter (Signed)
Called and spoke to patient and she had over night pulse ox done and it looks like it was scanned into system. She is asking for the results. Sir can you look at the results for this.   Please advise sir

## 2022-07-08 NOTE — Telephone Encounter (Signed)
Please let her know that her overnight oximetry does not show any desaturations.

## 2022-07-10 NOTE — Telephone Encounter (Signed)
Left message for patient to call back.

## 2022-07-10 NOTE — Telephone Encounter (Signed)
Called and spoke with patient. She verbalized understanding of results. She stated that she is still interested in getting O2 because she still has the increased SOB, especially with exertion but she is aware that she will need to qualify.   She did mention that she has home PT that was ordered through the [facility]. She had a visit on 01/10 that the therapist was concerned about her O2 levels. Instead of faxing the results of the walk test to our office, they faxed the results to her PCP who the patient quoted said "She would leave the oxygen concerns up to the pulmonologist". I advised her that if she was not placed on O2 after dropping below 88%, we could not use this as qualifying sats for oxygen.  I was able to find documentation of the walk test on 01/10 in her chart. Below is a copy of the results:    O2 SATS on room air today at rest at 94%; s/p gait today at 87-89% and within less than a minute patient at 90-91% today; O2 s/p standing therex patient at 91-92% this date  patient continues to have noted dyspnea with minimal activity but faster recovery today regarding O2 SATS    states she has a call into pulmonologist regarding her sleep test she had done a few minutes ago with a pulse oximeter    RB, can you please advise? Thanks!

## 2022-07-10 NOTE — Telephone Encounter (Signed)
If we have documentation that she desats to 87% w ambulation then we can order o2 for her. 2L/min. May be beneficial to order a walking titration on pulsed to get her a POC

## 2022-07-18 NOTE — Telephone Encounter (Signed)
Don't call in AM on Wed. She will be out until 1:00 or 2:00.

## 2022-07-18 NOTE — Telephone Encounter (Signed)
PT would like to move fwd with getting more O2 even from our parking lot to the door she needs a wheel chair. Please call to advise next step of her treatment plan and how she can get the extra O2. [phone].  States she will come in and do that walk test if that Is ness.

## 2022-07-31 NOTE — Telephone Encounter (Signed)
Pt will need to be set up with an office visit if she wants to try to qualify for O2. During the OV with either Dr. Lamonte Sakai or an APP, the walk test could be performed to see if pt does need O2.    Called and spoke with pt and scheduled her for an OV. Nothing further needed.

## 2022-08-11 ENCOUNTER — Ambulatory Visit (INDEPENDENT_AMBULATORY_CARE_PROVIDER_SITE_OTHER): Payer: Medicare Other

## 2022-08-11 ENCOUNTER — Encounter: Payer: Self-pay | Admitting: Nurse Practitioner

## 2022-08-11 ENCOUNTER — Ambulatory Visit: Payer: Medicare Other | Admitting: Nurse Practitioner

## 2022-08-11 VITALS — BP 132/66 | HR 73 | Temp 98.2°F | Ht 65.0 in | Wt 209.4 lb

## 2022-08-11 DIAGNOSIS — N185 Chronic kidney disease, stage 5: Secondary | ICD-10-CM

## 2022-08-11 DIAGNOSIS — J4489 Other specified chronic obstructive pulmonary disease: Secondary | ICD-10-CM

## 2022-08-11 DIAGNOSIS — D631 Anemia in chronic kidney disease: Secondary | ICD-10-CM | POA: Diagnosis not present

## 2022-08-11 DIAGNOSIS — Z992 Dependence on renal dialysis: Secondary | ICD-10-CM

## 2022-08-11 DIAGNOSIS — R0609 Other forms of dyspnea: Secondary | ICD-10-CM

## 2022-08-11 DIAGNOSIS — I5032 Chronic diastolic (congestive) heart failure: Secondary | ICD-10-CM | POA: Diagnosis not present

## 2022-08-11 DIAGNOSIS — U071 COVID-19: Secondary | ICD-10-CM | POA: Diagnosis not present

## 2022-08-11 DIAGNOSIS — J9611 Chronic respiratory failure with hypoxia: Secondary | ICD-10-CM

## 2022-08-11 LAB — CBC WITH DIFFERENTIAL/PLATELET
Basophils Absolute: 0 10*3/uL (ref 0.0–0.1)
Basophils Relative: 0.5 % (ref 0.0–3.0)
Eosinophils Absolute: 0.1 10*3/uL (ref 0.0–0.7)
Eosinophils Relative: 1.8 % (ref 0.0–5.0)
HCT: 41 % (ref 36.0–46.0)
Hemoglobin: 13.2 g/dL (ref 12.0–15.0)
Lymphocytes Relative: 30.6 % (ref 12.0–46.0)
Lymphs Abs: 1.5 10*3/uL (ref 0.7–4.0)
MCHC: 32.3 g/dL (ref 30.0–36.0)
MCV: 94.8 fl (ref 78.0–100.0)
Monocytes Absolute: 0.4 10*3/uL (ref 0.1–1.0)
Monocytes Relative: 8.4 % (ref 3.0–12.0)
Neutro Abs: 2.8 10*3/uL (ref 1.4–7.7)
Neutrophils Relative %: 58.7 % (ref 43.0–77.0)
Platelets: 147 10*3/uL — ABNORMAL LOW (ref 150.0–400.0)
RBC: 4.32 Mil/uL (ref 3.87–5.11)
RDW: 16.6 % — ABNORMAL HIGH (ref 11.5–15.5)
WBC: 4.8 10*3/uL (ref 4.0–10.5)

## 2022-08-11 LAB — BASIC METABOLIC PANEL
BUN: 31 mg/dL — ABNORMAL HIGH (ref 6–23)
CO2: 29 mEq/L (ref 19–32)
Calcium: 9.1 mg/dL (ref 8.4–10.5)
Chloride: 96 mEq/L (ref 96–112)
Creatinine, Ser: 3.84 mg/dL — ABNORMAL HIGH (ref 0.40–1.20)
GFR: 10.66 mL/min — CL (ref >60.00)
Glucose, Bld: 112 mg/dL — ABNORMAL HIGH (ref 70–99)
Potassium: 4.3 mEq/L (ref 3.5–5.1)
Sodium: 135 mEq/L (ref 135–145)

## 2022-08-11 LAB — BRAIN NATRIURETIC PEPTIDE: Pro B Natriuretic peptide (BNP): 1495 pg/mL — ABNORMAL HIGH (ref 0.0–100.0)

## 2022-08-11 MED ORDER — PREDNISONE 20 MG PO TABS: 40.0000 mg | ORAL_TABLET | Freq: Every day | ORAL | 0 refills | Status: AC

## 2022-08-11 NOTE — Progress Notes (Signed)
Kidney function slightly worse when compared to previous. She is already on HD. Electrolytes are stable. No evidence of anemia on her CBC, which is good. Her BNP was significantly elevated, which is usually a sign that the heart is having to work harder than it should due to volume overload. Let's have her repeat echocardiogram and then she needs to get in with cardiology and nephrology for further recommendations as I suspect this is the driving cause of her DOE, not her COPD. Thanks.

## 2022-08-11 NOTE — Progress Notes (Signed)
$@PatientV$  ID: Joyce [name], female    DOB: 08-20-1940, 82 y.o.   MRN: [id number]  No chief complaint on file.   Referring provider: Allie Dimmer, MD  HPI: 82 year old female, never smoker followed for COPD with asthma.  She is a patient of Dr. Agustina Caroli and last seen in office 04/04/2022.  Past medical history significant for CHF, history of NSTEMI, CAD status post CABG, hypertension, allergic rhinitis, fatty liver, ESRD on dialysis, DM2, OA, depression, fibromyalgia, obesity.  TEST/EVENTS:  05/30/2017 PFTs: FVC 77, FEV1 83, ratio 80, TLC 75, DLCOcor 64 05/11/2020 echo: EF 65-70%, mild LVH. G1DD. RV size and function nl. Mild MR. Mild AR 06/23/2020 Pulmonary perfusion: no perfusion defects noted  04/07/2021: OV with Dr. Lamonte Sakai.  Previously diagnosed with COPD related to fixed obstruction from asthma. Reported doing quite well with no recent exacerbations.  Functional capacity has improved with starting peritoneal dialysis.  Continued on Stiolto and as needed albuterol.  Continued Singulair and Flonase for allergic rhinitis.  DOE likely multifactorial with known COPD and heart disease; did have improvement with volume removal after starting dialysis.  12/14/2021: OV with Clarnce Homan NP for increased shortness of breath and dry cough over the past 2-3 weeks. She feels like she has had some chest tightness and soreness with frequent coughing but denies any chest pain. She has noticed an occasional wheeze as well. She denies any hemoptysis, leg swelling, weight gain, fevers, night sweats, recent sick exposures. She continues on Stiolto daily. Doesn't use albuterol often. She was treated for possible AECOPD with prednisone taper. CXR without superimposed infection but evidence of pulmonary edema. She was recommended to contact her cardiologist and nephrologist to determine next steps as she is on dialysis and already treated with bumex and spironolactone.   12/28/2021: OV with Shameka Aggarwal NP for follow up with her  daughter. She is feeling better since our last visit and feels like her breathing has mostly returned to her baseline. She does still have some increased shortness of breath with longer distances or climbing but she said they pulled off "3 pounds" of fluid yesterday, when they usually only do 1, and she felt better after this. Her cough has resolved. It is still hard for her to lay completely flat at night but did feel a little more relief last night after treatment. She feels like pulling more fluid helped her more than the prednisone did but she did notice a boost in energy and wheezing improved with it. She is still using Stiolto but has the [employer] she was provided with last time and is going to try switching after her Stiolto runs out.   04/04/2022: OV with Dr. Lamonte Sakai. She had COVID in July 2023 then again about 3 weeks ago. She hasn't noticed much difference with Brezti and has mixed feelings about it. Ok to go trial back on [employer]. Still have fatigued and dealing with a slight increase in her cough.  08/11/2022: Today - follow up Patient presents today for follow up. She would like to see if she could qualify for oxygen. She tells me that she has been working with home health and they told her, her oxygen dropped to 88% during one of their sessions. She tells me that it took her a little while to recover so they were concerned. Review of the note from 07/12/2021 states that she desaturated to 88-89% and returned to 90-93% after seated rest and focus on proper pursed lip breathing techniques. She has noticed levels as low  as 82% when she has checked it at home. She tells me this has been ongoing for the past year. She had ONO without significant desaturations (spent 11 seconds <88%) in December 2023. She has also been walked in the office without desaturations on room air.  She states that her breathing has been terrible over the past year, at least. She's had COVID twice and isn't sure if this has  something to do with it. She cannot complete any activities around the house without becoming short winded and having to rest. She is unable to even cook dinner for herself without resting. She still has a persistent cough with clear to white phlegm. No acute worsening of her symptoms over the past few weeks/months. She does have trouble with her legs swelling at times and orthopnea. Usually props herself up some or sleeps on her side. Currently being treated for cellulitis of BLE by her PCP as well. She completed doxycycline 10 day course for this and they may consider an additional agent because her symptoms are better but not resolved. She denies any fevers, chills, hemoptysis, palpitations, dizziness/lightheadedness. She has dialysis on Tuesdays, Thursdays and Saturdays. Last time they pulled off 4 liters; usually pull off around 2 liters.  Allergies  Allergen Reactions   Carvedilol Other (See Comments)    confusion   Codeine Other (See Comments)    confusion   Linaclotide Nausea And Vomiting   Metoprolol Palpitations    Immunization History  Administered Date(s) Administered   DT (Pediatric) 01/02/2003   Hepatitis B, PED/ADOLESCENT 01/05/2021   Influenza Split 05/14/1999, 04/09/2000, 05/04/2001, 04/11/2002, 04/10/2003, 04/13/2004, 05/16/2005, 04/16/2006, 04/16/2007, 08/06/2008, 04/14/2009   Influenza Whole 04/08/2010, 04/18/2011, 04/12/2012, 04/28/2013, 04/03/2017   Influenza, High Dose Seasonal PF 04/20/2014, 03/15/2015, 03/14/2016, 04/29/2018, 05/06/2019, 03/22/2020, 05/02/2021, 04/15/2022   Influenza,inj,Quad PF,6+ Mos 04/03/2017   Influenza-Unspecified 03/26/2014, 05/16/2018, 04/07/2020, 05/02/2021   Moderna SARS-COV2 Booster Vaccination 05/10/2020, 06/07/2020   Moderna Sars-Covid-2 Vaccination 09/10/2019, 10/07/2019   PPD Test 12/13/2020   Pneumococcal Conjugate-13 04/09/2000, 05/13/2008, 03/26/2014, 04/20/2014, 08/04/2015   Pneumococcal Polysaccharide-23 06/17/2018    Pneumococcal-Unspecified 06/17/2018    Past Medical History:  Diagnosis Date   Anemia    Arthritis    Asthma    Atrial fibrillation ([place])    Diagnosed October 2022   Bilateral pleural effusion    Postoperative, status post Pleurx catheter and talc treatment - Dr. Prescott Gum   CAD (coronary artery disease)    Multivessel status post CABG 05/2014 -  LIMA to LAD, SVG to diagonal, SVG to OM, SVG to PDA   Chronic lower back pain    COPD (chronic obstructive pulmonary disease) ([place])    ESRD (end stage renal disease) ([place])    Essential hypertension    Fibromyalgia    History of pneumonia    Hyperlipidemia    Myocardial infarction ([place])    Type II diabetes mellitus ([place])     Tobacco History: Social History   Tobacco Use  Smoking Status Never  Smokeless Tobacco Never   Counseling given: Not Answered   Outpatient Medications Prior to Visit  Medication Sig Dispense Refill   albuterol (PROVENTIL) (2.5 MG/3ML) 0.083% nebulizer solution Take 3 mLs (2.5 mg total) by nebulization every 6 (six) hours as needed for wheezing or shortness of breath. 75 mL 12   albuterol (VENTOLIN HFA) 108 (90 Base) MCG/ACT inhaler Inhale 2 puffs into the lungs every 4 (four) hours as needed for wheezing or shortness of breath. 8 g 5   atorvastatin (LIPITOR)  40 MG tablet Take 40 mg by mouth every evening.     bisoprolol (ZEBETA) 10 MG tablet Take 1 tablet (10 mg total) by mouth daily. 90 tablet 3   busPIRone (BUSPAR) 5 MG tablet Take 5 mg by mouth daily as needed (anxiety).     Cholecalciferol (VITAMIN D3) 2000 units TABS Take 4,000-6,000 Units by mouth See admin instructions. Take 4000 units by mouth every other day alternating with 6000 units on alternate days     diltiazem (CARDIZEM CD) 240 MG 24 hr capsule Take 240 mg by mouth daily.     ELIQUIS 5 MG TABS tablet TAKE 1 TABLET(5 MG) BY MOUTH TWICE DAILY 180 tablet 1   fluticasone (FLONASE) 50 MCG/ACT nasal spray Place 1 spray into both nostrils daily.       hydrALAZINE (APRESOLINE) 50 MG tablet Take 1 tablet (50 mg total) by mouth 2 (two) times daily. 180 tablet 1   insulin glargine (LANTUS) 100 UNIT/ML injection Inject 0.3 mLs (30 Units total) into the skin at bedtime. (Patient taking differently: Inject 20 Units into the skin at bedtime.) 10 mL 11   insulin lispro (HUMALOG) 100 UNIT/ML KiwkPen Inject 8 Units into the skin 3 (three) times daily with meals.     isosorbide mononitrate (IMDUR) 60 MG 24 hr tablet TAKE ONE TABLET BY MOUTH DAILY 90 tablet 1   losartan (COZAAR) 100 MG tablet Take 50 mg by mouth 2 (two) times daily.     naloxone (NARCAN) nasal spray 4 mg/0.1 mL Place 1 spray into the nose daily as needed (accidental overdose.). Use as directed.     nitroGLYCERIN (NITROSTAT) 0.4 MG SL tablet Place 1 tablet (0.4 mg total) under the tongue every 5 (five) minutes x 3 doses as needed for chest pain (if no relief after 2nd dose, proceed to ED or call 911). 25 tablet 3   primidone (MYSOLINE) 50 MG tablet Take 100 mg by mouth daily.   5   sodium bicarbonate 650 MG tablet Take 650 mg by mouth 2 (two) times daily.     Tiotropium Bromide-Olodaterol (STIOLTO RESPIMAT) 2.5-2.5 MCG/ACT AERS Inhale 2 puffs into the lungs daily. 4 g 4   traMADol (ULTRAM) 50 MG tablet Take 100 mg by mouth every 8 (eight) hours as needed for moderate pain.      bumetanide (BUMEX) 2 MG tablet Take 4 mg by mouth 2 (two) times daily. (Patient not taking: Reported on 08/11/2022)     calcitRIOL (ROCALTROL) 0.25 MCG capsule Take 0.25 mcg by mouth daily. (Patient not taking: Reported on 08/11/2022)     lactulose (CHRONULAC) 10 GM/15ML solution Take 30 g by mouth daily as needed for mild constipation. (Patient not taking: Reported on 08/11/2022)     spironolactone (ALDACTONE) 25 MG tablet Take 12.5 mg by mouth daily. (Patient not taking: Reported on 08/11/2022)     Budeson-Glycopyrrol-Formoterol (BREZTRI AEROSPHERE) 160-9-4.8 MCG/ACT AERO Inhale 2 puffs into the lungs in the morning and at  bedtime. (Patient not taking: Reported on 08/11/2022) 10.7 g 5   No facility-administered medications prior to visit.     Review of Systems:   Constitutional: No weight loss or gain, night sweats, fevers, chills. +fatigue, lassitude (baseline) HEENT: No headaches, difficulty swallowing, tooth/dental problems, or sore throat. No sneezing, itching, ear ache, nasal congestion, or post nasal drip CV:  +orthopnea; swelling in lower extremities. No chest pain, PND, anasarca, dizziness, palpitations, syncope Resp: +shortness of breath with exertion; cough. No excess mucus or change in color of  mucus. No hemoptysis. No wheezing.  No chest wall deformity GI:  No heartburn, indigestion, abdominal pain, nausea, vomiting, diarrhea, change in bowel habits, loss of appetite, bloody stools.  Skin: No rash, lesions, ulcerations MSK:  No joint pain or swelling. Neuro: No dizziness or lightheadedness.  Psych: No depression or anxiety. Mood stable.     Physical Exam:  BP 132/66 (BP Location: Left Arm, Patient Position: Sitting, Cuff Size: Normal)   Pulse 73   Temp 98.2 F (36.8 C) (Oral)   Ht 5' 5"$  (1.651 m)   Wt 209 lb 6.4 oz (95 kg)   SpO2 94%   BMI 34.85 kg/m   GEN: Pleasant, interactive, well-kempt; obese; in no acute distress. HEENT:  Normocephalic and atraumatic. PERRLA. Sclera white. Nasal turbinates pink, moist and patent bilaterally. No rhinorrhea present. Oropharynx pink and moist, without exudate or edema. No lesions, ulcerations, or postnasal drip.  NECK:  Supple w/ fair ROM. No JVD present. Normal carotid impulses w/o bruits. Thyroid symmetrical with no goiter or nodules palpated. No lymphadenopathy.   CV: RRR, no m/r/g. +1 BLE pedal edema. Pulses intact, +2 bilaterally. No cyanosis, pallor or clubbing. PULMONARY:  Unlabored, regular breathing. Minimal end expiratory wheezes bases bilaterally otherwise clear A&P. No accessory muscle use. No dullness to percussion. GI: BS present and  normoactive. Soft, non-tender to palpation. No organomegaly or masses detected.  MSK: No erythema, warmth or tenderness. Cap refil <2 sec all extrem. No deformities or joint swelling noted.  Neuro: A/Ox3. No focal deficits noted.   Skin: Warm, no lesions or rashe Psych: Normal affect and behavior. Judgement and thought content appropriate.     Lab Results:  CBC    Component Value Date/Time   WBC 3.6 (L) 11/20/2020 0131   RBC 2.59 (L) 11/20/2020 0131   HGB 7.6 (L) 11/20/2020 0131   HCT 23.9 (L) 11/20/2020 0131   PLT 131 (L) 11/20/2020 0131   MCV 92.3 11/20/2020 0131   MCH 29.3 11/20/2020 0131   MCHC 31.8 11/20/2020 0131   RDW 13.2 11/20/2020 0131   LYMPHSABS 0.8 11/18/2020 1226   MONOABS 0.3 11/18/2020 1226   EOSABS 0.1 11/18/2020 1226   BASOSABS 0.0 11/18/2020 1226    BMET    Component Value Date/Time   NA 135 11/24/2020 0239   K 3.4 (L) 11/24/2020 0239   CL 98 11/24/2020 0239   CO2 27 11/24/2020 0239   GLUCOSE 160 (H) 11/24/2020 0239   BUN 73 (H) 11/24/2020 0239   CREATININE 3.39 (H) 11/24/2020 0239   CALCIUM 8.6 (L) 11/24/2020 0239   GFRNONAA 13 (L) 11/24/2020 0239   GFRAA 22 (L) 02/03/2020 0325    BNP    Component Value Date/Time   BNP 1,340.4 (H) 11/18/2020 1227     Imaging:  DG Chest 2 View  Result Date: 08/11/2022 CLINICAL DATA:  Provided history: COPD with asthma. Increased dyspnea and cough. Acute exacerbation of COPD/asthma versus volume overload. EXAM: CHEST - 2 VIEW COMPARISON:  Prior chest radiographs 12/28/2021 and earlier. FINDINGS: Prior median sternotomy/CABG. Cardiomegaly. Aortic atherosclerosis. Small pleural effusion with fissural fluid on the left. Trace right pleural effusion. Ill-defined opacities within the bilateral lung bases. No overt pulmonary edema. No evidence of pneumothorax. Degenerative changes of the spine. IMPRESSION: 1. Ill-defined opacities within the bilateral lung bases, which may reflect atelectasis and/or pneumonia. 2.  Small pleural effusion with fissural fluid on the left. 3. Trace right pleural effusion. 4. Aortic Atherosclerosis (ICD10-I70.0). Electronically Signed   By: Kellie Simmering D.O.  On: 08/11/2022 10:53         Latest Ref Rng & Units 05/30/2017    3:05 PM 06/12/2014    2:45 PM  PFT Results  FVC-Pre L 2.29  2.53   FVC-Predicted Pre % 77  82   FVC-Post L 2.31  2.64   FVC-Predicted Post % 77  86   Pre FEV1/FVC % % 77  86   Post FEV1/FCV % % 80  87   FEV1-Pre L 1.76  2.18   FEV1-Predicted Pre % 78  94   FEV1-Post L 1.86  2.31   DLCO uncorrected ml/min/mmHg 16.09  23.43   DLCO UNC% % 62  91   DLCO corrected ml/min/mmHg 16.57    DLCO COR %Predicted % 64    DLVA Predicted % 94  113   TLC L 3.90  4.94   TLC % Predicted % 75  94   RV % Predicted % 73  99     No results found for: "NITRICOXIDE"      Assessment & Plan:   Dyspnea Progressive DOE likely multifactorial related to volume overload secondary to CKD stage 5 and CHF, COPD/asthma, chronic anemia. I think that her volume status is the primary driver.  Her chest x-ray today shows bilateral lower lobe opacities likely atelectasis, trace effusion on the right and small effusion on the left.  She will have HD again tomorrow.  She has been covered from an antimicrobial standpoint with doxycycline 10-day course and she does not have any significant infectious symptoms to correlate to a potential pneumonia.  She admits to adherence with Eliquis so low suspicion for PE.  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  We will plan to check a HRCT after dialysis treatment so hopefully we can assess her more of a euvolemic status.  May consider repeat pulmonary function testing and echocardiogram.  Recheck labs today to ensure that she does not have worsening anemia and evaluate BNP.  Patient Instructions  Continue Stiolto 2 puffs daily until you have finished your current inhaler then  switch to Breztri 2 puffs Twice daily. Brush tongue and rinse mouth afterwards. Continue Albuterol inhaler 2 puffs or 3 mL neb every 6 hours as needed for shortness of breath or wheezing. Notify if symptoms persist despite rescue inhaler/neb use. Use nebulizer at least twice a day  Continue singulair 10 mg At bedtime  Continue flonase 2 sprays each nostril daily for allergies/nasal congestion   Start supplemental oxygen 2 lpm as needed with activity for goal oxygen >88-90% Prednisone 40 mg daily for 5 days. Take in AM with food. Let me know if you notice any difference in your breathing/cough with this  High resolution CT chest - this will need to be scheduled on an afternoon after dialysis or the following AM   Labs today   Follow up in 4 weeks with Dr. Lamonte Sakai or Alanson Aly. If symptoms do not improve or worsen, please contact office for sooner follow up or seek emergency care.   COPD with asthma ([place]) I have a lower suspicion that her symptoms are related to poorly controlled/exacerbation of COPD/asthma given timeframe and lack of response to triple therapy.  She does not appear to be having acute symptoms but we are going to try her on short course of prednisone to see if she has any perceived benefit.  She did have some mild bronchospasm on exam today which is likely related  to volume overload.  She will continue Stiolto for maintenance.  Previously tried on [place] without any significant response.  Action plan in place.  Chronic respiratory failure with hypoxia ([place]) She has had some decreased oxygen levels over the past year at home.  Had documentation when working with PT that she dropped to 88% but recovered with rest.  We did walk her today in office and she had brief desaturation to 87% on room air.  She was able to maintain saturations above 96% with 2 L supplemental O2.  See above plan regarding further workup.  In the interim, we will provide her with supplemental oxygen to have at  home to use for goal greater than 88 to 90%.  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  Volume overload on imaging today.  She is scheduled for dialysis treatment tomorrow.  Diastolic CHF, chronic ([facility]) She has some mild bilateral lower extremity edema on exam today.  See above plan.  May recommend sooner follow-up with cardiology depending on BNP/echo results.     I spent 60 minutes of dedicated to the care of this patient on the date of this encounter to include pre-visit review of records, face-to-face time with the patient discussing conditions above, post visit ordering of testing, clinical documentation with the electronic health record, making appropriate referrals as documented, and communicating necessary findings to members of the patients care team.  Clayton Bibles, NP 08/11/2022  Pt aware and understands NP's role.

## 2022-08-11 NOTE — Assessment & Plan Note (Signed)
She has some mild bilateral lower extremity edema on exam today.  See above plan.  May recommend sooner follow-up with cardiology depending on BNP/echo results.

## 2022-08-11 NOTE — Assessment & Plan Note (Addendum)
Progressive DOE likely multifactorial related to volume overload secondary to CKD stage 5 and CHF, COPD/asthma, chronic anemia. I think that her volume status is the primary driver.  Her chest x-ray today shows bilateral lower lobe opacities likely atelectasis, trace effusion on the right and small effusion on the left.  She will have HD again tomorrow.  She has been covered from an antimicrobial standpoint with doxycycline 10-day course and she does not have any significant infectious symptoms to correlate to a potential pneumonia.  She admits to adherence with Eliquis so low suspicion for PE.  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  We will plan to check a HRCT after dialysis treatment so hopefully we can assess her more of a euvolemic status.  May consider repeat pulmonary function testing and echocardiogram.  Recheck labs today to ensure that she does not have worsening anemia and evaluate BNP.  Patient Instructions  Continue Stiolto 2 puffs daily until you have finished your current inhaler then switch to Breztri 2 puffs Twice daily. Brush tongue and rinse mouth afterwards. Continue Albuterol inhaler 2 puffs or 3 mL neb every 6 hours as needed for shortness of breath or wheezing. Notify if symptoms persist despite rescue inhaler/neb use. Use nebulizer at least twice a day  Continue singulair 10 mg At bedtime  Continue flonase 2 sprays each nostril daily for allergies/nasal congestion   Start supplemental oxygen 2 lpm as needed with activity for goal oxygen >88-90% Prednisone 40 mg daily for 5 days. Take in AM with food. Let me know if you notice any difference in your breathing/cough with this  High resolution CT chest - this will need to be scheduled on an afternoon after dialysis or the following AM   Labs today   Follow up in 4 weeks with Dr. Lamonte Sakai or Alanson Aly. If symptoms do not improve or worsen, please contact office for  sooner follow up or seek emergency care.

## 2022-08-11 NOTE — Assessment & Plan Note (Signed)
She has had some decreased oxygen levels over the past year at home.  Had documentation when working with PT that she dropped to 88% but recovered with rest.  We did walk her today in office and she had brief desaturation to 87% on room air.  She was able to maintain saturations above 96% with 2 L supplemental O2.  See above plan regarding further workup.  In the interim, we will provide her with supplemental oxygen to have at home to use for goal greater than 88 to 90%.

## 2022-08-11 NOTE — Assessment & Plan Note (Signed)
On HD.  Volume overload on imaging today.  She is scheduled for dialysis treatment tomorrow.

## 2022-08-11 NOTE — Patient Instructions (Addendum)
Continue Stiolto 2 puffs daily until you have finished your current inhaler then switch to [employer] 2 puffs Twice daily. Brush tongue and rinse mouth afterwards. Continue Albuterol inhaler 2 puffs or 3 mL neb every 6 hours as needed for shortness of breath or wheezing. Notify if symptoms persist despite rescue inhaler/neb use. Use nebulizer at least twice a day  Continue singulair 10 mg At bedtime  Continue flonase 2 sprays each nostril daily for allergies/nasal congestion   Start supplemental oxygen 2 lpm as needed with activity for goal oxygen >88-90% Prednisone 40 mg daily for 5 days. Take in AM with food. Let me know if you notice any difference in your breathing/cough with this  High resolution CT chest - this will need to be scheduled on an afternoon after dialysis or the following AM   Labs today   Follow up in 4 weeks with Dr. Lamonte Sakai or Alanson Aly. If symptoms do not improve or worsen, please contact office for sooner follow up or seek emergency care.

## 2022-08-11 NOTE — Assessment & Plan Note (Signed)
I have a lower suspicion that her symptoms are related to poorly controlled/exacerbation of COPD/asthma given timeframe and lack of response to triple therapy.  She does not appear to be having acute symptoms but we are going to try her on short course of prednisone to see if she has any perceived benefit.  She did have some mild bronchospasm on exam today which is likely related to volume overload.  She will continue Stiolto for maintenance.  Previously tried on [place] without any significant response.  Action plan in place.

## 2022-08-15 ENCOUNTER — Telehealth: Payer: Self-pay | Admitting: Nurse Practitioner

## 2022-08-15 NOTE — Telephone Encounter (Signed)
Full sats needed for O2 order to adapt     Room air at rest:  Room air while ambulating:  recovering sat:

## 2022-08-16 ENCOUNTER — Other Ambulatory Visit: Payer: Self-pay

## 2022-08-16 DIAGNOSIS — R0609 Other forms of dyspnea: Secondary | ICD-10-CM

## 2022-08-29 ENCOUNTER — Ambulatory Visit (HOSPITAL_COMMUNITY)
Admission: RE | Admit: 2022-08-29 | Discharge: 2022-08-29 | Disposition: A | Discharge status: Home / Self Care | Payer: Medicare Other | Source: Ambulatory Visit | Attending: Nurse Practitioner | Admitting: Nurse Practitioner

## 2022-08-29 DIAGNOSIS — R0609 Other forms of dyspnea: Secondary | ICD-10-CM | POA: Diagnosis present

## 2022-08-29 DIAGNOSIS — U071 COVID-19: Secondary | ICD-10-CM | POA: Diagnosis present

## 2022-09-04 NOTE — Progress Notes (Signed)
No evidence of ILD. She has an enlarged pulmonary artery, which can be seen with pulmonary hypertension (this can cause worsening SOB). She needs to attend the echo I previously ordered. She has chronic, small pleural effusions (fluid between lung and sack the lung sits in). These are stable. She has evidence of cirrhosis and moderate ascites - needs to follow up with her PCP and send referral to GI. This could also contribute to her DOE from the added pressure on the lungs. Thanks.

## 2022-09-08 ENCOUNTER — Ambulatory Visit: Payer: Medicare Other | Admitting: Nurse Practitioner

## 2022-09-11 ENCOUNTER — Telehealth: Payer: Self-pay | Admitting: Nurse Practitioner

## 2022-09-11 MED ORDER — STIOLTO RESPIMAT 2.5-2.5 MCG/ACT IN AERS: 2.0000 | INHALATION_SPRAY | Freq: Every day | RESPIRATORY_TRACT | 4 refills | Status: DC

## 2022-09-11 NOTE — Telephone Encounter (Signed)
PT calling because Ms. Cobb gave her Judithann Sauger  to replace Stiolto and now she has SOB, Palpitations, high BP and High Sugar.  Pls call to advise. She wonders if Ms. Cobb knows this med should not be used for people with high BP like the TV commercials show.  Sending back High Priority due to SOB.   Pls call PT @ [phone]  She says please call in [place] [facility] in [place]

## 2022-09-11 NOTE — Telephone Encounter (Signed)
Called and spoke with patient. She stated that the [facility] has caused her to have numerous side effects. She was audibly SOB on the phone. She stated that it caused her to have increased SOB, high blood pressure and blood sugar readings as well as palpations.   She mentioned that she had tried [place] before and had the same reaction. She was unaware of the medication change from last month but went ahead and tried the [place] again.   Because of this, she wants to go back to the [employer]. I advised her I would go ahead and send it to her pharmacy. She verbalized understanding. Judithann Sauger has also been added to her allergy list as requested.   Nothing further needed at time of call.

## 2022-09-13 ENCOUNTER — Ambulatory Visit: Payer: Medicare Other | Admitting: Cardiology

## 2022-09-18 ENCOUNTER — Other Ambulatory Visit (HOSPITAL_COMMUNITY): Payer: Medicare Other

## 2022-09-19 ENCOUNTER — Ambulatory Visit (HOSPITAL_COMMUNITY): Payer: Medicare Other

## 2022-09-28 ENCOUNTER — Other Ambulatory Visit (HOSPITAL_COMMUNITY): Payer: Medicare Other

## 2022-10-06 ENCOUNTER — Ambulatory Visit: Payer: Medicare Other | Admitting: Emergency Medicine

## 2022-10-18 ENCOUNTER — Ambulatory Visit (HOSPITAL_COMMUNITY)
Admission: RE | Admit: 2022-10-18 | Discharge: 2022-10-18 | Disposition: A | Discharge status: Home / Self Care | Payer: Medicare Other | Source: Ambulatory Visit | Attending: Nurse Practitioner | Admitting: Nurse Practitioner

## 2022-10-18 DIAGNOSIS — R0609 Other forms of dyspnea: Secondary | ICD-10-CM | POA: Insufficient documentation

## 2022-10-18 LAB — ECHOCARDIOGRAM COMPLETE
AR max vel: 1.73 cm2
AV Area VTI: 1.55 cm2
AV Area mean vel: 1.76 cm2
AV Mean grad: 9 mmHg
AV Peak grad: 16 mmHg
Ao pk vel: 2 m/s
Area-P 1/2: 4.21 cm2
MV VTI: 1.75 cm2
S' Lateral: 2.6 cm

## 2022-10-18 NOTE — Progress Notes (Signed)
*  PRELIMINARY RESULTS* Echocardiogram 2D Echocardiogram has been performed.  Stacey Drain 10/18/2022, 4:06 PM

## 2022-11-02 NOTE — Progress Notes (Signed)
Echo with grade II diastolic dysfunction (diastolic HF). She has RV strain as well with elevated pulmonary artery pressures, consistent with pulmonary hypertension. She also has findings of volume overload, which is multifactorial. She needs to have follow up with cardiology. She also needs to be scheduled with Dr. Delton Coombes as it doesn't look like she has been seen for follow up since our last visit in February. Thanks.

## 2022-11-02 NOTE — Progress Notes (Signed)
Called and spoke with the pt and went over results/recs and scheduled appt with Dr Delton Coombes for 11/07/22.

## 2022-11-07 ENCOUNTER — Encounter: Payer: Self-pay | Admitting: Emergency Medicine

## 2022-11-07 ENCOUNTER — Ambulatory Visit: Payer: Medicare Other | Admitting: Emergency Medicine

## 2022-11-07 VITALS — BP 122/86 | HR 74 | Ht 65.0 in | Wt 222.0 lb

## 2022-11-07 DIAGNOSIS — J4489 Other specified chronic obstructive pulmonary disease: Secondary | ICD-10-CM

## 2022-11-07 DIAGNOSIS — J90 Pleural effusion, not elsewhere classified: Secondary | ICD-10-CM | POA: Diagnosis not present

## 2022-11-07 DIAGNOSIS — R0609 Other forms of dyspnea: Secondary | ICD-10-CM

## 2022-11-07 DIAGNOSIS — J301 Allergic rhinitis due to pollen: Secondary | ICD-10-CM

## 2022-11-07 NOTE — Assessment & Plan Note (Signed)
Post bilateral talc pleurodeses.  Only a small amount of residual loculated fluid present

## 2022-11-07 NOTE — Assessment & Plan Note (Signed)
Continue same

## 2022-11-07 NOTE — Assessment & Plan Note (Signed)
Multifactorial dyspnea with components of her diastolic dysfunction and secondary PAH, complicated volume status due to renal failure, COPD, deconditioning.  We are working to optimize these issues and improve her functional capacity.  We talked today about possible pulmonary or cardiac rehab.  She will begin to slowly increase her home exercise, work on her functional capacity.

## 2022-11-07 NOTE — Patient Instructions (Signed)
Please continue Stiolto 2 puffs once daily. Keep your albuterol available use either 2 puffs or 1 nebulizer treatment when you needed for shortness of breath, chest tightness, wheezing. We reviewed your CT scan of the chest today.  There is no evidence of interstitial lung disease related to your prior COVID infections.  This is good news. Agree with beginning to slowly and carefully increase your exercise and conditioning.  This will help your overall functional capacity. Continue to follow with cardiology and nephrology as planned. Keep your flu shot and COVID-19 vaccine up-to-date next fall Follow with APP in 6 months Follow Dr. Delton Coombes in 12 months or sooner if you have any problems.

## 2022-11-07 NOTE — Progress Notes (Signed)
Subjective:    Patient ID: Joyce [name], female    DOB: 1941/02/02, 82 y.o.   MRN: [id number]   Shortness of Breath Pertinent negatives include no ear pain, fever, headaches, leg swelling, rash, rhinorrhea, sore throat, vomiting or wheezing.    ROV 04/04/2022 --Kateena is 68 and follows up today for her history of COPD/fixed asthma, some associated restrictive lung disease.  PMH also significant for CAD/CABG, hypertension with diastolic dysfunction, chronic renal failure now on HD.  She had COVID-19 in July 2023, then had it again about 3 weeks ago.  Since last time we have changed her from [employer] to [place].  Today she reports that she remains very fatigued. Her cough is improving but more than her usual baseline. She was seen by Dr Jonny Ruiz. She has mixed feelings about the breztri - remains unsure about whether it helps consistently. Minimal albuterol use - did need it during covid. Flu shot up to date.   ROV 11/07/22 --82 year old woman with COPD/mixed asthma, some associated restrictive lung disease.  She also has a history of CAD/CABG complicated by effusions that required bilateral talc pleurodesis, hypertension with diastolic dysfunction, chronic renal failure on HD.  She is on Eliquis for A Fib.  She had COVID-19 in 12/2021 and also 03/2022. She tried Clinical cytogeneticist but had side effects and felt that she got more benefit from [place], currently on [employer]. She uses albuterol rarely, not every day. She does get dyspnea with walking 50 ft, has to stop to rest. Stable dry wt. No flares. She was on pred for shingles and eczema since I last saw her.   High-resolution CT chest 08/29/2022 reviewed by me shows no evidence of fibrotic interstitial lung disease.  There are chronic loculated bilateral pleural effusions with some pleural thickening and calcification   Review of Systems  Constitutional:  Negative for fever and unexpected weight change.  HENT:  Negative for congestion, dental problem, ear  pain, nosebleeds, postnasal drip, rhinorrhea, sinus pressure, sneezing, sore throat and trouble swallowing.   Eyes:  Negative for redness and itching.  Respiratory:  Positive for shortness of breath. Negative for cough, chest tightness and wheezing.   Cardiovascular:  Negative for palpitations and leg swelling.  Gastrointestinal:  Negative for nausea and vomiting.  Genitourinary:  Negative for dysuria.  Musculoskeletal:  Negative for joint swelling.  Skin:  Negative for rash.  Neurological:  Negative for headaches.  Hematological:  Does not bruise/bleed easily.  Psychiatric/Behavioral:  Negative for dysphoric mood. The patient is not nervous/anxious.        Objective:   Physical Exam Vitals:   11/07/22 1546  BP: 122/86  Pulse: 74  SpO2: 95%  Weight: 222 lb (100.7 kg)  Height: 5\' 5"  (1.651 m)   Gen: Pleasant, well-nourished, in no distress,  normal affect  ENT: No lesions,  mouth clear,  oropharynx clear, no postnasal drip  Neck: No JVD, no Stridor  Lungs: No use of accessory muscles, Good air movement, no wheeze or crackles  Cardiovascular: RRR, 3/6 syst M w intact S2  Musculoskeletal: No deformities, no cyanosis or clubbing  Neuro: alert, non focal  Skin: Warm, some faint bruising below each eye from a recent fall      Assessment & Plan:  Dyspnea Multifactorial dyspnea with components of her diastolic dysfunction and secondary PAH, complicated volume status due to renal failure, COPD, deconditioning.  We are working to optimize these issues and improve her functional capacity.  We talked today about possible pulmonary or  cardiac rehab.  She will begin to slowly increase her home exercise, work on her functional capacity.  COPD with asthma (HCC) Continue Stiolto.  Albuterol as needed.  Continue to keep her vaccinations up-to-date.  Allergic rhinitis Continue same  Bilateral pleural effusion Post bilateral talc pleurodeses.  Only a small amount of residual  loculated fluid present  Levy Pupa, MD, PhD 11/07/2022, 4:21 PM [facility] [phone] or if no answer [phone]

## 2022-11-07 NOTE — Assessment & Plan Note (Signed)
Continue Stiolto.  Albuterol as needed.  Continue to keep her vaccinations up-to-date.

## 2023-04-30 ENCOUNTER — Ambulatory Visit: Payer: Medicare Other | Admitting: Adult Health

## 2023-05-11 ENCOUNTER — Ambulatory Visit: Payer: Medicare Other | Admitting: Adult Health

## 2023-07-17 ENCOUNTER — Other Ambulatory Visit: Payer: Self-pay | Admitting: Emergency Medicine

## 2024-01-24 ENCOUNTER — Ambulatory Visit: Admitting: Emergency Medicine

## 2024-01-24 DIAGNOSIS — R0609 Other forms of dyspnea: Secondary | ICD-10-CM

## 2024-01-24 DIAGNOSIS — J4489 Other specified chronic obstructive pulmonary disease: Secondary | ICD-10-CM

## 2024-04-07 ENCOUNTER — Encounter: Payer: Self-pay | Admitting: Nurse Practitioner

## 2024-04-07 ENCOUNTER — Ambulatory Visit (INDEPENDENT_AMBULATORY_CARE_PROVIDER_SITE_OTHER)

## 2024-04-07 ENCOUNTER — Ambulatory Visit (INDEPENDENT_AMBULATORY_CARE_PROVIDER_SITE_OTHER): Admitting: Nurse Practitioner

## 2024-04-07 VITALS — BP 124/54 | HR 60 | Temp 98.4°F | Ht 63.0 in | Wt 229.2 lb

## 2024-04-07 DIAGNOSIS — K7460 Unspecified cirrhosis of liver: Secondary | ICD-10-CM

## 2024-04-07 DIAGNOSIS — N185 Chronic kidney disease, stage 5: Secondary | ICD-10-CM

## 2024-04-07 DIAGNOSIS — J90 Pleural effusion, not elsewhere classified: Secondary | ICD-10-CM | POA: Diagnosis not present

## 2024-04-07 DIAGNOSIS — R0609 Other forms of dyspnea: Secondary | ICD-10-CM

## 2024-04-07 DIAGNOSIS — J4489 Other specified chronic obstructive pulmonary disease: Secondary | ICD-10-CM

## 2024-04-07 DIAGNOSIS — R188 Other ascites: Secondary | ICD-10-CM

## 2024-04-07 MED ORDER — ALBUTEROL SULFATE HFA 108 (90 BASE) MCG/ACT IN AERS: 2.0000 | INHALATION_SPRAY | RESPIRATORY_TRACT | 3 refills | Status: AC | PRN

## 2024-04-07 MED ORDER — STIOLTO RESPIMAT 2.5-2.5 MCG/ACT IN AERS: 2.0000 | INHALATION_SPRAY | Freq: Every day | RESPIRATORY_TRACT | 11 refills | Status: AC

## 2024-04-07 NOTE — Assessment & Plan Note (Signed)
 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 See above. Follow up with nephrology as scheduled

## 2024-04-07 NOTE — Assessment & Plan Note (Signed)
 No evidence of exacerbation. See above. Continue current inhaler regimen. Refilled today. Action plan in place.

## 2024-04-07 NOTE — Assessment & Plan Note (Signed)
 See above.

## 2024-04-07 NOTE — Assessment & Plan Note (Signed)
 Multifactorial with large component related to volume overload/ascites. Worsening DOE correlates with increasing ascites. Due for paracentesis this Wednesday and receives clinical benefit. Discussed correlation with pt who verbalized understanding. Lung exam unremarkable. Will reassess CXR to ensure effusions are stable but low suspicion for increased accumulation based on exam. Oxygen status stable on room air.  Patient Instructions  Continue Stiolto 2 puffs daily Continue Albuterol  inhaler 2 puffs or 3 mL neb every 6 hours as needed for shortness of breath or wheezing. Notify if symptoms persist despite rescue inhaler/neb use.  Continue singulair  10 mg At bedtime  Continue flonase  2 sprays each nostril daily for allergies/nasal congestion   Attend paracentesis  Your breathing issues seem to be mostly related to fluid overload in your abdomen   Chest x ray today - will contact you with results  Follow up in 6 months with Dr. Shelah or Izetta Malachy PIETY. If symptoms do not improve or worsen, please contact office for sooner follow up or seek emergency care.

## 2024-04-07 NOTE — Patient Instructions (Addendum)
 Continue Stiolto 2 puffs daily Continue Albuterol  inhaler 2 puffs or 3 mL neb every 6 hours as needed for shortness of breath or wheezing. Notify if symptoms persist despite rescue inhaler/neb use.  Continue singulair  10 mg At bedtime  Continue flonase  2 sprays each nostril daily for allergies/nasal congestion   Attend paracentesis  Your breathing issues seem to be mostly related to fluid overload in your abdomen   Chest x ray today - will contact you with results  Follow up in 6 months with Dr. Shelah or Joyce [name]. If symptoms do not improve or worsen, please contact office for sooner follow up or seek emergency care.

## 2024-04-07 NOTE — Progress Notes (Signed)
 @Patient  ID: Joyce [name], female    DOB: February 20, 1941, 83 y.o.   MRN: [id number]  Chief Complaint  Patient presents with   Follow-up    Pt states she is about the same but maybe a little more difficulty breathing - pt states she thinks it may be due to fluid. Pt denies any coughing    Referring provider: Lawrance Handing, MD  HPI: 83 year old female, never smoker followed for COPD with asthma.  She is a patient of Dr. Lanny and last seen in office 10/2022.  Past medical history significant for CHF, history of NSTEMI, CAD status post CABG, hypertension, allergic rhinitis, fatty liver, ESRD on dialysis, DM2, OA, depression, fibromyalgia, obesity, recurrent ascites.  TEST/EVENTS:  05/30/2017 PFTs: FVC 77, FEV1 83, ratio 80, TLC 75, DLCOcor 64 05/11/2020 echo: EF 65-70%, mild LVH. G1DD. RV size and function nl. Mild MR. Mild AR 06/23/2020 Pulmonary perfusion: no perfusion defects noted 08/29/2022 HRCT chest: atherosclerosis/CAD. Cardiomegaly s/p CABG. Enlarged PA. Small, chronic, loculated b/l effusions with pleural thickening and calcification r/t prior talc  pleurodesis. Lobular air trapping. Coarse, nodular cirrhotic morphology of the liver. Moderate volume ascites.   11/07/2022: OV with Dr. Shelah. Multifactorial dyspnea with components of her diastolic dysfunction and secondary PAH, complicated volume status due to renal failure, COPD and deconditioning. She will begin to slowly increase her home exercise, work on her functional capacity. Continue Stiolto and albuterol  as needed. Post b/l talc  pleurodeses; stable residual loculated fluid present.   04/07/2024: Today - follow up Discussed the use of AI scribe software for clinical note transcription with the patient, who gave verbal consent to proceed.  History of Present Illness Joyce [name] is an 83 year old female with COPD and kidney failure who presents with increased shortness of breath.  She has experienced increased shortness  of breath over the past several months, particularly when walking, which correlates with worsening ascites. Her breathing becomes more difficult as she approaches the time for her scheduled paracentesis, which occurs every two weeks. She experiences relief in her breathing after the procedure, which involves the removal of 9 to 10 liters of fluid from her abdomen. She is scheduled for her next paracentesis on Wednesday. They were previously only doing her taps every 3 weeks then moved her to every 2 weeks.   She has a history of kidney failure and undergoes dialysis on Tuesdays, Thursdays and Saturdays. Her blood pressure has been low, which she attributes to her kidney condition. She takes midodrine as needed, particularly during dialysis sessions when her blood pressure drops. Weight fluctuates with fluid retention.   She uses a Stiolto inhaler regularly and occasionally uses albuterol , which she finds helpful. She requires a prescription refill for her inhalers. No cough, fever, or hemoptysis.   She experiences stable swelling in her legs. No orthopnea, PND, chest pain, wheezing, fevers, hemoptysis. No low oxygen levels noted. No supplemental oxygen at home.     Allergies  Allergen Reactions   Breztri  Aerosphere [Budeson-Glycopyrrol-Formoterol] Shortness Of Breath and Palpitations   Carvedilol Other (See Comments)    confusion   Codeine Other (See Comments)    confusion   Linaclotide  Nausea And Vomiting   Metoprolol Palpitations    Immunization History  Administered Date(s) Administered   DT (Pediatric) 01/02/2003   Hepatitis B, PED/ADOLESCENT 01/05/2021   INFLUENZA, HIGH DOSE SEASONAL PF 04/20/2014, 03/15/2015, 03/14/2016, 04/29/2018, 05/06/2019, 03/22/2020, 05/02/2021, 04/15/2022   Influenza Split 05/14/1999, 04/09/2000, 05/04/2001, 04/11/2002, 04/10/2003, 04/13/2004, 05/16/2005, 04/16/2006, 04/16/2007, 08/06/2008, 04/14/2009  Influenza Whole 04/08/2010, 04/18/2011, 04/12/2012,  04/28/2013, 04/03/2017   Influenza,inj,Quad PF,6+ Mos 04/03/2017   Influenza-Unspecified 03/26/2014, 05/16/2018, 04/07/2020, 05/02/2021   Moderna SARS-COV2 Booster Vaccination 05/10/2020, 06/07/2020   Moderna Sars-Covid-2 Vaccination 09/10/2019, 10/07/2019   PPD Test 12/13/2020   Pfizer Covid-19 Vaccine Bivalent Booster 55yrs & up 05/03/2021   Pneumococcal Conjugate-13 04/09/2000, 05/13/2008, 03/26/2014, 04/20/2014, 08/04/2015   Pneumococcal Polysaccharide-23 06/17/2018   Pneumococcal-Unspecified 06/17/2018    Past Medical History:  Diagnosis Date   Anemia    Arthritis    Asthma    Atrial fibrillation (HCC)    Diagnosed October 2022   Bilateral pleural effusion    Postoperative, status post Pleurx catheter and talc  treatment - Dr. Fleeta Ochoa   CAD (coronary artery disease)    Multivessel status post CABG 05/2014 -  LIMA to LAD, SVG to diagonal, SVG to OM, SVG to PDA   Chronic lower back pain    COPD (chronic obstructive pulmonary disease) (HCC)    ESRD (end stage renal disease) (HCC)    Essential hypertension    Fibromyalgia    History of pneumonia    Hyperlipidemia    Myocardial infarction (HCC)    Type II diabetes mellitus (HCC)     Tobacco History: Social History   Tobacco Use  Smoking Status Never  Smokeless Tobacco Never   Counseling given: Not Answered   Outpatient Medications Prior to Visit  Medication Sig Dispense Refill   albuterol  (PROVENTIL ) (2.5 MG/3ML) 0.083% nebulizer solution Take 3 mLs (2.5 mg total) by nebulization every 6 (six) hours as needed for wheezing or shortness of breath. 75 mL 12   atorvastatin  (LIPITOR) 40 MG tablet Take 40 mg by mouth every evening.     bisoprolol  (ZEBETA ) 10 MG tablet Take 1 tablet (10 mg total) by mouth daily. 90 tablet 3   busPIRone  (BUSPAR ) 5 MG tablet Take 5 mg by mouth daily as needed (anxiety).     Cholecalciferol (VITAMIN D3) 2000 units TABS Take 4,000-6,000 Units by mouth See admin instructions. Take 4000 units by  mouth every other day alternating with 6000 units on alternate days     fluticasone  (FLONASE ) 50 MCG/ACT nasal spray Place 1 spray into both nostrils daily.      insulin  glargine (LANTUS ) 100 UNIT/ML injection Inject 0.3 mLs (30 Units total) into the skin at bedtime. (Patient taking differently: Inject 22 Units into the skin at bedtime.) 10 mL 11   insulin  lispro (HUMALOG ) 100 UNIT/ML KiwkPen Inject 8 Units into the skin 3 (three) times daily with meals.     naloxone  (NARCAN ) nasal spray 4 mg/0.1 mL Place 1 spray into the nose daily as needed (accidental overdose.). Use as directed.     nitroGLYCERIN  (NITROSTAT ) 0.4 MG SL tablet Place 1 tablet (0.4 mg total) under the tongue every 5 (five) minutes x 3 doses as needed for chest pain (if no relief after 2nd dose, proceed to ED or call 911). 25 tablet 3   primidone  (MYSOLINE ) 50 MG tablet Take 100 mg by mouth daily.   5   sodium bicarbonate  650 MG tablet Take 650 mg by mouth 2 (two) times daily.     traMADol  (ULTRAM ) 50 MG tablet Take 100 mg by mouth every 8 (eight) hours as needed for moderate pain.      albuterol  (VENTOLIN  HFA) 108 (90 Base) MCG/ACT inhaler Inhale 2 puffs into the lungs every 4 (four) hours as needed for wheezing or shortness of breath. 8 g 5   STIOLTO RESPIMAT  2.5-2.5  MCG/ACT AERS INHALE 2 PUFFS INTO THE LUNGS DAILY 4 g 4   diltiazem (CARDIZEM CD) 240 MG 24 hr capsule Take 240 mg by mouth daily. (Patient not taking: Reported on 04/07/2024)     ELIQUIS  5 MG TABS tablet TAKE 1 TABLET(5 MG) BY MOUTH TWICE DAILY (Patient not taking: Reported on 04/07/2024) 180 tablet 1   hydrALAZINE  (APRESOLINE ) 50 MG tablet Take 1 tablet (50 mg total) by mouth 2 (two) times daily. (Patient not taking: Reported on 04/07/2024) 180 tablet 1   isosorbide  mononitrate (IMDUR ) 60 MG 24 hr tablet TAKE ONE TABLET BY MOUTH DAILY (Patient not taking: Reported on 04/07/2024) 90 tablet 1   lactulose  (CHRONULAC ) 10 GM/15ML solution Take 30 g by mouth daily as needed  for mild constipation. (Patient not taking: Reported on 04/07/2024)     losartan (COZAAR) 100 MG tablet Take 50 mg by mouth 2 (two) times daily. (Patient not taking: Reported on 04/07/2024)     midodrine (PROAMATINE) 10 MG tablet Take 1 tablet by mouth.     No facility-administered medications prior to visit.     Review of Systems: as above    Physical Exam:  BP (!) 124/54   Pulse 60   Temp 98.4 F (36.9 C)   Ht 5' 3 (1.6 m)   Wt 229 lb 3.2 oz (104 kg)   SpO2 97% Comment: RA  BMI 40.60 kg/m   GEN: Pleasant, interactive, well-kempt; obese; in no acute distress. HEENT:  Normocephalic and atraumatic. PERRLA. Sclera white. Nasal turbinates pink, moist and patent bilaterally. No rhinorrhea present. Oropharynx pink and moist, without exudate or edema. No lesions, ulcerations, or postnasal drip.  NECK:  Supple w/ fair ROM. No JVD present. No lymphadenopathy.   CV: RRR, no m/r/g. +1 BLE pedal edema. Pulses intact, +2 bilaterally. No cyanosis, pallor or clubbing. PULMONARY:  Unlabored, regular breathing. Diminished bilaterally A&P w/o wheezes/rales/rhonchi. No accessory muscle use. No dullness to percussion. GI: BS present and normoactive. Taut, protuberant, Non-tender to palpation.  MSK: No erythema, warmth or tenderness. Cap refil <2 sec all extrem.  Neuro: A/Ox3. No focal deficits noted.   Skin: Warm, no lesions or rashe Psych: Normal affect and behavior. Judgement and thought content appropriate.     Lab Results:  CBC    Component Value Date/Time   WBC 4.8 08/11/2022 1148   RBC 4.32 08/11/2022 1148   HGB 13.2 08/11/2022 1148   HCT 41.0 08/11/2022 1148   PLT 147.0 (L) 08/11/2022 1148   MCV 94.8 08/11/2022 1148   MCH 29.3 11/20/2020 0131   MCHC 32.3 08/11/2022 1148   RDW 16.6 (H) 08/11/2022 1148   LYMPHSABS 1.5 08/11/2022 1148   MONOABS 0.4 08/11/2022 1148   EOSABS 0.1 08/11/2022 1148   BASOSABS 0.0 08/11/2022 1148    BMET    Component Value Date/Time   NA 135  08/11/2022 1148   K 4.3 08/11/2022 1148   CL 96 08/11/2022 1148   CO2 29 08/11/2022 1148   GLUCOSE 112 (H) 08/11/2022 1148   BUN 31 (H) 08/11/2022 1148   CREATININE 3.84 (H) 08/11/2022 1148   CALCIUM  9.1 08/11/2022 1148   GFRNONAA 13 (L) 11/24/2020 0239   GFRAA 22 (L) 02/03/2020 0325    BNP    Component Value Date/Time   BNP 1,340.4 (H) 11/18/2020 1227     Imaging:  No results found.  Administration History     None          Latest Ref Rng & Units 05/30/2017  3:05 PM 06/12/2014    2:45 PM  PFT Results  FVC-Pre L 2.29  2.53   FVC-Predicted Pre % 77  82   FVC-Post L 2.31  2.64   FVC-Predicted Post % 77  86   Pre FEV1/FVC % % 77  86   Post FEV1/FCV % % 80  87   FEV1-Pre L 1.76  2.18   FEV1-Predicted Pre % 78  94   FEV1-Post L 1.86  2.31   DLCO uncorrected ml/min/mmHg 16.09  23.43   DLCO UNC% % 62  91   DLCO corrected ml/min/mmHg 16.57    DLCO COR %Predicted % 64    DLVA Predicted % 94  113   TLC L 3.90  4.94   TLC % Predicted % 75  94   RV % Predicted % 73  99     No results found for: NITRICOXIDE      Assessment & Plan:   Dyspnea Multifactorial with large component related to volume overload/ascites. Worsening DOE correlates with increasing ascites. Due for paracentesis this Wednesday and receives clinical benefit. Discussed correlation with pt who verbalized understanding. Lung exam unremarkable. Will reassess CXR to ensure effusions are stable but low suspicion for increased accumulation based on exam. Oxygen status stable on room air.  Patient Instructions  Continue Stiolto 2 puffs daily Continue Albuterol  inhaler 2 puffs or 3 mL neb every 6 hours as needed for shortness of breath or wheezing. Notify if symptoms persist despite rescue inhaler/neb use.  Continue singulair  10 mg At bedtime  Continue flonase  2 sprays each nostril daily for allergies/nasal congestion   Attend paracentesis  Your breathing issues seem to be mostly related to fluid  overload in your abdomen   Chest x ray today - will contact you with results  Follow up in 6 months with Dr. Shelah or Izetta [name] [name]. If symptoms do not improve or worsen, please contact office for sooner follow up or seek emergency care.   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 See above. Follow up with nephrology as scheduled   COPD with asthma (HCC) No evidence of exacerbation. See above. Continue current inhaler regimen. Refilled today. Action plan in place.   Bilateral pleural effusion See above  Cirrhosis of liver with ascites (HCC) See above      I spent 35 minutes of dedicated to the care of this patient on the date of this encounter to include pre-visit review of records, face-to-face time with the patient discussing conditions above, post visit ordering of testing, clinical documentation with the electronic health record, making appropriate referrals as documented, and communicating necessary findings to members of the patients care team.  Joyce Joyce Malachy, NP 04/07/2024  Pt aware and understands NP's role.

## 2024-04-09 ENCOUNTER — Ambulatory Visit: Payer: Self-pay | Admitting: Nurse Practitioner

## 2024-04-09 NOTE — Progress Notes (Signed)
 Surprisingly, CXR showed decreased fluid on both sides of the lungs, which is good news. Continue paracenteses as scheduled. Thanks.

## 2024-04-09 NOTE — Progress Notes (Signed)
 ATC x1. LVMTCB

## 2024-04-10 NOTE — Progress Notes (Signed)
 Called and spoke to pt - advised of CXR results per [facility]. Pt verbalized understanding, NFN.
